# Patient Record
Sex: Male | Born: 1955
Health system: Southern US, Community
[De-identification: ages and names within clinical notes are randomized; demographics above are authoritative.]

## PROBLEM LIST (undated history)

## (undated) DIAGNOSIS — E2740 Unspecified adrenocortical insufficiency: Secondary | ICD-10-CM

## (undated) DIAGNOSIS — E119 Type 2 diabetes mellitus without complications: Secondary | ICD-10-CM

## (undated) DIAGNOSIS — Z86010 Personal history of colonic polyps: Secondary | ICD-10-CM

## (undated) DIAGNOSIS — E1143 Type 2 diabetes mellitus with diabetic autonomic (poly)neuropathy: Secondary | ICD-10-CM

## (undated) DIAGNOSIS — F809 Developmental disorder of speech and language, unspecified: Secondary | ICD-10-CM

## (undated) DIAGNOSIS — G20 Parkinson's disease: Secondary | ICD-10-CM

## (undated) DIAGNOSIS — G8250 Quadriplegia, unspecified: Secondary | ICD-10-CM

## (undated) DIAGNOSIS — K219 Gastro-esophageal reflux disease without esophagitis: Secondary | ICD-10-CM

## (undated) DIAGNOSIS — D649 Anemia, unspecified: Secondary | ICD-10-CM

## (undated) DIAGNOSIS — N390 Urinary tract infection, site not specified: Secondary | ICD-10-CM

## (undated) DIAGNOSIS — N12 Tubulo-interstitial nephritis, not specified as acute or chronic: Secondary | ICD-10-CM

## (undated) DIAGNOSIS — Z931 Gastrostomy status: Secondary | ICD-10-CM

## (undated) DIAGNOSIS — R1310 Dysphagia, unspecified: Secondary | ICD-10-CM

## (undated) DIAGNOSIS — F32A Depression, unspecified: Secondary | ICD-10-CM

## (undated) DIAGNOSIS — G9340 Encephalopathy, unspecified: Secondary | ICD-10-CM

## (undated) DIAGNOSIS — R569 Unspecified convulsions: Secondary | ICD-10-CM

## (undated) DIAGNOSIS — R6510 Systemic inflammatory response syndrome (SIRS) of non-infectious origin without acute organ dysfunction: Secondary | ICD-10-CM

## (undated) DIAGNOSIS — E559 Vitamin D deficiency, unspecified: Secondary | ICD-10-CM

## (undated) DIAGNOSIS — R911 Solitary pulmonary nodule: Secondary | ICD-10-CM

## (undated) DIAGNOSIS — F329 Major depressive disorder, single episode, unspecified: Secondary | ICD-10-CM

## (undated) DIAGNOSIS — K567 Ileus, unspecified: Secondary | ICD-10-CM

## (undated) DIAGNOSIS — M24542 Contracture, left hand: Secondary | ICD-10-CM

## (undated) DIAGNOSIS — G20A1 Parkinson's disease without dyskinesia, without mention of fluctuations: Secondary | ICD-10-CM

## (undated) DIAGNOSIS — S065X9A Traumatic subdural hemorrhage with loss of consciousness of unspecified duration, initial encounter: Secondary | ICD-10-CM

## (undated) DIAGNOSIS — J309 Allergic rhinitis, unspecified: Secondary | ICD-10-CM

## (undated) DIAGNOSIS — R4701 Aphasia: Secondary | ICD-10-CM

## (undated) DIAGNOSIS — D509 Iron deficiency anemia, unspecified: Secondary | ICD-10-CM

## (undated) DIAGNOSIS — A0472 Enterocolitis due to Clostridium difficile, not specified as recurrent: Secondary | ICD-10-CM

## (undated) DIAGNOSIS — Z993 Dependence on wheelchair: Secondary | ICD-10-CM

## (undated) DIAGNOSIS — H269 Unspecified cataract: Secondary | ICD-10-CM

## (undated) DIAGNOSIS — I6203 Nontraumatic chronic subdural hemorrhage: Secondary | ICD-10-CM

## (undated) DIAGNOSIS — E2749 Other adrenocortical insufficiency: Secondary | ICD-10-CM

## (undated) DIAGNOSIS — K3184 Gastroparesis: Secondary | ICD-10-CM

## (undated) DIAGNOSIS — J189 Pneumonia, unspecified organism: Secondary | ICD-10-CM

## (undated) DIAGNOSIS — K5900 Constipation, unspecified: Secondary | ICD-10-CM

## (undated) HISTORY — DX: Unspecified cataract: H26.9

## (undated) HISTORY — DX: Type 2 diabetes mellitus without complications: E11.9

## (undated) HISTORY — PX: PERIPHERALLY INSERTED CENTRAL CATHETER INSERTION: SHX2221

## (undated) HISTORY — DX: Enterocolitis due to Clostridium difficile, not specified as recurrent: A04.72

## (undated) HISTORY — DX: Dependence on wheelchair: Z99.3

## (undated) HISTORY — DX: Type 2 diabetes mellitus with diabetic autonomic (poly)neuropathy: E11.43

## (undated) HISTORY — DX: Gastro-esophageal reflux disease without esophagitis: K21.9

## (undated) HISTORY — DX: Nontraumatic chronic subdural hemorrhage: I62.03

## (undated) HISTORY — DX: Iron deficiency anemia, unspecified: D50.9

## (undated) HISTORY — DX: Systemic inflammatory response syndrome (sirs) of non-infectious origin without acute organ dysfunction: R65.10

## (undated) HISTORY — DX: Gastrostomy status: Z93.1

## (undated) HISTORY — DX: Contracture, left hand: M24.542

## (undated) HISTORY — DX: Tubulo-interstitial nephritis, not specified as acute or chronic: N12

## (undated) HISTORY — PX: ESOPHAGOGASTRODUODENOSCOPY: SHX1529

## (undated) HISTORY — DX: Pneumonia, unspecified organism: J18.9

## (undated) HISTORY — DX: Personal history of colonic polyps: Z86.010

## (undated) HISTORY — PX: COLONOSCOPY: SHX174

## (undated) HISTORY — DX: Solitary pulmonary nodule: R91.1

## (undated) HISTORY — DX: Constipation, unspecified: K59.00

## (undated) HISTORY — PX: PEG TUBE PLACEMENT: SUR1034

## (undated) HISTORY — DX: Ileus, unspecified: K56.7

## (undated) HISTORY — DX: Gastroparesis: K31.84

## (undated) HISTORY — DX: Vitamin D deficiency, unspecified: E55.9

## (undated) HISTORY — DX: Traumatic subdural hemorrhage with loss of consciousness of unspecified duration, initial encounter: S06.5X9A

## (undated) HISTORY — DX: Allergic rhinitis, unspecified: J30.9

## (undated) HISTORY — DX: Parkinson's disease: G20

## (undated) HISTORY — DX: Urinary tract infection, site not specified: N39.0

## (undated) HISTORY — DX: Encephalopathy, unspecified: G93.40

## (undated) HISTORY — DX: Developmental disorder of speech and language, unspecified: F80.9

## (undated) HISTORY — DX: Parkinson's disease without dyskinesia, without mention of fluctuations: G20.A1

## (undated) HISTORY — DX: Unspecified adrenocortical insufficiency: E27.40

## (undated) HISTORY — DX: Other adrenocortical insufficiency: E27.49

---
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
## 2004-09-21 ENCOUNTER — Encounter: Admission: RE | Admit: 2004-09-21 | Discharge: 2004-09-21 | Payer: Self-pay | Admitting: Gastroenterology

## 2004-10-31 ENCOUNTER — Emergency Department (HOSPITAL_COMMUNITY): Admission: EM | Admit: 2004-10-31 | Discharge: 2004-11-01 | Payer: Self-pay | Admitting: Emergency Medicine

## 2004-12-01 ENCOUNTER — Ambulatory Visit (HOSPITAL_COMMUNITY): Admission: RE | Admit: 2004-12-01 | Discharge: 2004-12-01 | Payer: Self-pay | Admitting: Gastroenterology

## 2004-12-21 ENCOUNTER — Inpatient Hospital Stay (HOSPITAL_COMMUNITY): Admission: EM | Admit: 2004-12-21 | Discharge: 2004-12-25 | Payer: Self-pay | Admitting: Emergency Medicine

## 2005-05-29 ENCOUNTER — Inpatient Hospital Stay (HOSPITAL_COMMUNITY): Admission: EM | Admit: 2005-05-29 | Discharge: 2005-06-08 | Payer: Self-pay | Admitting: Emergency Medicine

## 2005-11-10 ENCOUNTER — Emergency Department (HOSPITAL_COMMUNITY): Admission: EM | Admit: 2005-11-10 | Discharge: 2005-11-11 | Payer: Self-pay | Admitting: Emergency Medicine

## 2006-02-13 ENCOUNTER — Inpatient Hospital Stay (HOSPITAL_COMMUNITY): Admission: EM | Admit: 2006-02-13 | Discharge: 2006-02-14 | Payer: Self-pay | Admitting: Emergency Medicine

## 2006-07-05 ENCOUNTER — Ambulatory Visit (HOSPITAL_COMMUNITY): Admission: RE | Admit: 2006-07-05 | Discharge: 2006-07-05 | Payer: Self-pay | Admitting: Internal Medicine

## 2006-08-23 ENCOUNTER — Emergency Department (HOSPITAL_COMMUNITY): Admission: EM | Admit: 2006-08-23 | Discharge: 2006-08-23 | Payer: Self-pay | Admitting: Emergency Medicine

## 2006-12-14 ENCOUNTER — Ambulatory Visit (HOSPITAL_COMMUNITY): Admission: RE | Admit: 2006-12-14 | Discharge: 2006-12-14 | Payer: Self-pay | Admitting: Internal Medicine

## 2007-03-14 ENCOUNTER — Emergency Department (HOSPITAL_COMMUNITY): Admission: EM | Admit: 2007-03-14 | Discharge: 2007-03-14 | Payer: Self-pay | Admitting: Emergency Medicine

## 2008-01-10 ENCOUNTER — Ambulatory Visit (HOSPITAL_COMMUNITY): Admission: RE | Admit: 2008-01-10 | Discharge: 2008-01-10 | Payer: Self-pay | Admitting: Internal Medicine

## 2008-06-12 ENCOUNTER — Inpatient Hospital Stay (HOSPITAL_COMMUNITY): Admission: EM | Admit: 2008-06-12 | Discharge: 2008-06-16 | Payer: Self-pay | Admitting: Emergency Medicine

## 2008-07-04 ENCOUNTER — Emergency Department (HOSPITAL_COMMUNITY): Admission: EM | Admit: 2008-07-04 | Discharge: 2008-07-05 | Payer: Self-pay | Admitting: Emergency Medicine

## 2008-08-18 ENCOUNTER — Encounter: Payer: Self-pay | Admitting: Emergency Medicine

## 2008-08-18 ENCOUNTER — Ambulatory Visit: Payer: Self-pay | Admitting: Diagnostic Radiology

## 2008-08-19 ENCOUNTER — Inpatient Hospital Stay (HOSPITAL_COMMUNITY): Admission: EM | Admit: 2008-08-19 | Discharge: 2008-08-21 | Payer: Self-pay | Admitting: Internal Medicine

## 2008-12-16 ENCOUNTER — Emergency Department (HOSPITAL_BASED_OUTPATIENT_CLINIC_OR_DEPARTMENT_OTHER): Admission: EM | Admit: 2008-12-16 | Discharge: 2008-12-16 | Payer: Self-pay | Admitting: Emergency Medicine

## 2008-12-16 ENCOUNTER — Ambulatory Visit: Payer: Self-pay | Admitting: Radiology

## 2009-07-06 ENCOUNTER — Emergency Department (HOSPITAL_COMMUNITY): Admission: EM | Admit: 2009-07-06 | Discharge: 2009-07-07 | Payer: Self-pay | Admitting: Emergency Medicine

## 2010-04-18 ENCOUNTER — Encounter: Payer: Self-pay | Admitting: Gastroenterology

## 2010-06-16 LAB — CBC
MCHC: 31.8 g/dL (ref 30.0–36.0)
MCV: 78.8 fL (ref 78.0–100.0)
Platelets: 222 10*3/uL (ref 150–400)
RDW: 13.8 % (ref 11.5–15.5)

## 2010-06-16 LAB — BASIC METABOLIC PANEL
BUN: 20 mg/dL (ref 6–23)
CO2: 29 mEq/L (ref 19–32)
Chloride: 101 mEq/L (ref 96–112)
Creatinine, Ser: 0.55 mg/dL (ref 0.4–1.5)
Glucose, Bld: 89 mg/dL (ref 70–99)

## 2010-06-16 LAB — URINALYSIS, ROUTINE W REFLEX MICROSCOPIC
Hgb urine dipstick: NEGATIVE
Protein, ur: 30 mg/dL — AB
Urobilinogen, UA: 1 mg/dL (ref 0.0–1.0)

## 2010-06-16 LAB — URINE MICROSCOPIC-ADD ON

## 2010-06-16 LAB — DIFFERENTIAL
Basophils Relative: 0 % (ref 0–1)
Eosinophils Absolute: 0.1 10*3/uL (ref 0.0–0.7)
Monocytes Relative: 9 % (ref 3–12)
Neutrophils Relative %: 57 % (ref 43–77)

## 2010-07-02 LAB — CULTURE, BLOOD (ROUTINE X 2)
Culture: NO GROWTH
Culture: NO GROWTH

## 2010-07-02 LAB — URINE CULTURE

## 2010-07-02 LAB — CBC
HCT: 37.2 % — ABNORMAL LOW (ref 39.0–52.0)
Hemoglobin: 12.1 g/dL — ABNORMAL LOW (ref 13.0–17.0)
MCHC: 32.6 g/dL (ref 30.0–36.0)
MCV: 76.5 fL — ABNORMAL LOW (ref 78.0–100.0)
RBC: 4.86 MIL/uL (ref 4.22–5.81)

## 2010-07-02 LAB — DIFFERENTIAL
Basophils Relative: 1 % (ref 0–1)
Eosinophils Absolute: 0.1 10*3/uL (ref 0.0–0.7)
Eosinophils Relative: 1 % (ref 0–5)
Monocytes Absolute: 1.5 10*3/uL — ABNORMAL HIGH (ref 0.1–1.0)
Monocytes Relative: 13 % — ABNORMAL HIGH (ref 3–12)
Neutrophils Relative %: 72 % (ref 43–77)

## 2010-07-02 LAB — URINALYSIS, ROUTINE W REFLEX MICROSCOPIC
Bilirubin Urine: NEGATIVE
Protein, ur: 30 mg/dL — AB
Urobilinogen, UA: 1 mg/dL (ref 0.0–1.0)

## 2010-07-02 LAB — BASIC METABOLIC PANEL
CO2: 29 mEq/L (ref 19–32)
Chloride: 100 mEq/L (ref 96–112)
Creatinine, Ser: 0.7 mg/dL (ref 0.4–1.5)
GFR calc Af Amer: >60 mL/min (ref >60)

## 2010-07-02 LAB — URINE MICROSCOPIC-ADD ON

## 2010-07-06 LAB — CBC
HCT: 34 % — ABNORMAL LOW (ref 39.0–52.0)
Hemoglobin: 11.1 g/dL — ABNORMAL LOW (ref 13.0–17.0)
Hemoglobin: 9.8 g/dL — ABNORMAL LOW (ref 13.0–17.0)
Hemoglobin: 12.7 g/dL — ABNORMAL LOW (ref 13.0–17.0)
MCHC: 32.6 g/dL (ref 30.0–36.0)
MCHC: 32.6 g/dL (ref 30.0–36.0)
MCHC: 32.7 g/dL (ref 30.0–36.0)
MCV: 77.2 fL — ABNORMAL LOW (ref 78.0–100.0)
Platelets: 187 10*3/uL (ref 150–400)
Platelets: 203 10*3/uL (ref 150–400)
RBC: 3.96 MIL/uL — ABNORMAL LOW (ref 4.22–5.81)
RBC: 4.46 MIL/uL (ref 4.22–5.81)
RBC: 5.15 MIL/uL (ref 4.22–5.81)
RDW: 14.2 % (ref 11.5–15.5)
RDW: 14.2 % (ref 11.5–15.5)
WBC: 7.4 10*3/uL (ref 4.0–10.5)

## 2010-07-06 LAB — BASIC METABOLIC PANEL
BUN: 6 mg/dL (ref 6–23)
CO2: 25 mEq/L (ref 19–32)
Calcium: 7.9 mg/dL — ABNORMAL LOW (ref 8.4–10.5)
Calcium: 8.1 mg/dL — ABNORMAL LOW (ref 8.4–10.5)
Chloride: 103 mEq/L (ref 96–112)
Creatinine, Ser: 0.55 mg/dL (ref 0.4–1.5)
GFR calc non Af Amer: >60 mL/min (ref >60)
Glucose, Bld: 113 mg/dL — ABNORMAL HIGH (ref 70–99)
Glucose, Bld: 115 mg/dL — ABNORMAL HIGH (ref 70–99)
Sodium: 136 mEq/L (ref 135–145)

## 2010-07-06 LAB — URINALYSIS, MICROSCOPIC ONLY
Glucose, UA: NEGATIVE mg/dL
pH: 5.5 (ref 5.0–8.0)

## 2010-07-06 LAB — URINALYSIS, ROUTINE W REFLEX MICROSCOPIC
Protein, ur: NEGATIVE mg/dL
Urobilinogen, UA: 1 mg/dL (ref 0.0–1.0)

## 2010-07-06 LAB — COMPREHENSIVE METABOLIC PANEL
ALT: 29 U/L (ref 0–53)
ALT: 22 U/L (ref 0–53)
Alkaline Phosphatase: 105 U/L (ref 39–117)
Alkaline Phosphatase: 167 U/L — ABNORMAL HIGH (ref 39–117)
CO2: 26 mEq/L (ref 19–32)
CO2: 27 mEq/L (ref 19–32)
Calcium: 7.8 mg/dL — ABNORMAL LOW (ref 8.4–10.5)
GFR calc non Af Amer: >60 mL/min (ref >60)
GFR calc non Af Amer: >60 mL/min (ref >60)
Glucose, Bld: 95 mg/dL (ref 70–99)
Glucose, Bld: 86 mg/dL (ref 70–99)
Potassium: 3.9 mEq/L (ref 3.5–5.1)
Potassium: 4.2 mEq/L (ref 3.5–5.1)
Sodium: 136 mEq/L (ref 135–145)
Sodium: 140 mEq/L (ref 135–145)

## 2010-07-06 LAB — DIFFERENTIAL
Basophils Absolute: 0 10*3/uL (ref 0.0–0.1)
Basophils Absolute: 0 10*3/uL (ref 0.0–0.1)
Basophils Relative: 0 % (ref 0–1)
Basophils Relative: 0 % (ref 0–1)
Basophils Relative: 1 % (ref 0–1)
Eosinophils Absolute: 0.1 10*3/uL (ref 0.0–0.7)
Eosinophils Absolute: 0.1 10*3/uL (ref 0.0–0.7)
Eosinophils Relative: 0 % (ref 0–5)
Lymphocytes Relative: 10 % — ABNORMAL LOW (ref 12–46)
Monocytes Absolute: 0.7 10*3/uL (ref 0.1–1.0)
Monocytes Relative: 6 % (ref 3–12)
Neutrophils Relative %: 81 % — ABNORMAL HIGH (ref 43–77)
Neutrophils Relative %: 85 % — ABNORMAL HIGH (ref 43–77)

## 2010-07-06 LAB — PROTIME-INR: INR: 1.2 (ref 0.00–1.49)

## 2010-07-06 LAB — URINE CULTURE: Culture: NO GROWTH

## 2010-07-06 LAB — CULTURE, BLOOD (ROUTINE X 2)
Culture: NO GROWTH
Culture: NO GROWTH

## 2010-07-06 LAB — URINE MICROSCOPIC-ADD ON

## 2010-07-07 LAB — COMPREHENSIVE METABOLIC PANEL
ALT: 22 U/L (ref 0–53)
AST: 16 U/L (ref 0–37)
Alkaline Phosphatase: 109 U/L (ref 39–117)
CO2: 22 mEq/L (ref 19–32)
Chloride: 93 mEq/L — ABNORMAL LOW (ref 96–112)
Creatinine, Ser: 0.59 mg/dL (ref 0.4–1.5)
GFR calc Af Amer: >60 mL/min (ref >60)
GFR calc non Af Amer: >60 mL/min (ref >60)
Potassium: 3.6 mEq/L (ref 3.5–5.1)
Sodium: 124 mEq/L — ABNORMAL LOW (ref 135–145)
Total Bilirubin: 0.6 mg/dL (ref 0.3–1.2)

## 2010-07-07 LAB — DIFFERENTIAL
Basophils Absolute: 0 10*3/uL (ref 0.0–0.1)
Basophils Relative: 0 % (ref 0–1)
Eosinophils Absolute: 0 10*3/uL (ref 0.0–0.7)
Eosinophils Relative: 0 % (ref 0–5)

## 2010-07-07 LAB — URINALYSIS, ROUTINE W REFLEX MICROSCOPIC
Bilirubin Urine: NEGATIVE
Ketones, ur: NEGATIVE mg/dL
Nitrite: POSITIVE — AB
Urobilinogen, UA: 1 mg/dL (ref 0.0–1.0)

## 2010-07-07 LAB — LIPASE, BLOOD: Lipase: 21 U/L (ref 11–59)

## 2010-07-07 LAB — URINE CULTURE: Colony Count: >=100000

## 2010-07-07 LAB — CBC
MCV: 76.5 fL — ABNORMAL LOW (ref 78.0–100.0)
RBC: 4.49 MIL/uL (ref 4.22–5.81)
WBC: 17.2 10*3/uL — ABNORMAL HIGH (ref 4.0–10.5)

## 2010-07-08 LAB — DIFFERENTIAL
Lymphocytes Relative: 14 % (ref 12–46)
Lymphs Abs: 1.8 10*3/uL (ref 0.7–4.0)
Monocytes Absolute: 1.7 10*3/uL — ABNORMAL HIGH (ref 0.1–1.0)
Monocytes Relative: 14 % — ABNORMAL HIGH (ref 3–12)
Neutro Abs: 9 10*3/uL — ABNORMAL HIGH (ref 1.7–7.7)

## 2010-07-08 LAB — BASIC METABOLIC PANEL
BUN: 8 mg/dL (ref 6–23)
CO2: 24 mEq/L (ref 19–32)
CO2: 24 mEq/L (ref 19–32)
Calcium: 7.8 mg/dL — ABNORMAL LOW (ref 8.4–10.5)
Calcium: 7.9 mg/dL — ABNORMAL LOW (ref 8.4–10.5)
Calcium: 8.2 mg/dL — ABNORMAL LOW (ref 8.4–10.5)
Chloride: 103 mEq/L (ref 96–112)
Chloride: 108 mEq/L (ref 96–112)
Creatinine, Ser: 0.62 mg/dL (ref 0.4–1.5)
GFR calc Af Amer: >60 mL/min (ref >60)
GFR calc Af Amer: >60 mL/min (ref >60)
GFR calc non Af Amer: >60 mL/min (ref >60)
Glucose, Bld: 114 mg/dL — ABNORMAL HIGH (ref 70–99)
Potassium: 3.9 mEq/L (ref 3.5–5.1)
Potassium: 4.1 mEq/L (ref 3.5–5.1)
Sodium: 136 mEq/L (ref 135–145)
Sodium: 137 mEq/L (ref 135–145)

## 2010-07-08 LAB — URINALYSIS, ROUTINE W REFLEX MICROSCOPIC
Bilirubin Urine: NEGATIVE
Ketones, ur: NEGATIVE mg/dL
Nitrite: POSITIVE — AB
Urobilinogen, UA: 1 mg/dL (ref 0.0–1.0)

## 2010-07-08 LAB — CBC
HCT: 28.7 % — ABNORMAL LOW (ref 39.0–52.0)
HCT: 33.2 % — ABNORMAL LOW (ref 39.0–52.0)
HCT: 35.2 % — ABNORMAL LOW (ref 39.0–52.0)
HCT: 35.7 % — ABNORMAL LOW (ref 39.0–52.0)
Hemoglobin: 10.5 g/dL — ABNORMAL LOW (ref 13.0–17.0)
Hemoglobin: 9.4 g/dL — ABNORMAL LOW (ref 13.0–17.0)
Hemoglobin: 9.9 g/dL — ABNORMAL LOW (ref 13.0–17.0)
MCHC: 31.7 g/dL (ref 30.0–36.0)
MCHC: 32.3 g/dL (ref 30.0–36.0)
MCHC: 32.9 g/dL (ref 30.0–36.0)
MCV: 77.9 fL — ABNORMAL LOW (ref 78.0–100.0)
Platelets: 245 10*3/uL (ref 150–400)
RBC: 3.71 MIL/uL — ABNORMAL LOW (ref 4.22–5.81)
RBC: 3.95 MIL/uL — ABNORMAL LOW (ref 4.22–5.81)
RBC: 4.51 MIL/uL (ref 4.22–5.81)
RDW: 13.2 % (ref 11.5–15.5)
RDW: 13.8 % (ref 11.5–15.5)
WBC: 10.9 10*3/uL — ABNORMAL HIGH (ref 4.0–10.5)
WBC: 5.7 10*3/uL (ref 4.0–10.5)

## 2010-07-08 LAB — COMPREHENSIVE METABOLIC PANEL
Albumin: 3.2 g/dL — ABNORMAL LOW (ref 3.5–5.2)
Alkaline Phosphatase: 117 U/L (ref 39–117)
BUN: 12 mg/dL (ref 6–23)
Calcium: 8.6 mg/dL (ref 8.4–10.5)
Potassium: 3.6 mEq/L (ref 3.5–5.1)
Total Protein: 7.1 g/dL (ref 6.0–8.3)

## 2010-07-08 LAB — URINE CULTURE: Colony Count: >=100000

## 2010-07-08 LAB — WOUND CULTURE

## 2010-07-08 LAB — URINE MICROSCOPIC-ADD ON

## 2010-07-20 ENCOUNTER — Emergency Department (HOSPITAL_BASED_OUTPATIENT_CLINIC_OR_DEPARTMENT_OTHER)
Admission: EM | Admit: 2010-07-20 | Discharge: 2010-07-20 | Disposition: A | Discharge status: Home / Self Care | Payer: Medicare Other | Attending: Emergency Medicine | Admitting: Emergency Medicine

## 2010-07-20 ENCOUNTER — Emergency Department (INDEPENDENT_AMBULATORY_CARE_PROVIDER_SITE_OTHER): Payer: Medicare Other

## 2010-07-20 DIAGNOSIS — G20 Parkinson's disease: Secondary | ICD-10-CM | POA: Insufficient documentation

## 2010-07-20 DIAGNOSIS — R4701 Aphasia: Secondary | ICD-10-CM | POA: Insufficient documentation

## 2010-07-20 DIAGNOSIS — G20A1 Parkinson's disease without dyskinesia, without mention of fluctuations: Secondary | ICD-10-CM | POA: Insufficient documentation

## 2010-07-20 DIAGNOSIS — Z79899 Other long term (current) drug therapy: Secondary | ICD-10-CM | POA: Insufficient documentation

## 2010-07-20 DIAGNOSIS — Z431 Encounter for attention to gastrostomy: Secondary | ICD-10-CM

## 2010-07-20 DIAGNOSIS — Y849 Medical procedure, unspecified as the cause of abnormal reaction of the patient, or of later complication, without mention of misadventure at the time of the procedure: Secondary | ICD-10-CM | POA: Insufficient documentation

## 2010-07-20 DIAGNOSIS — K9423 Gastrostomy malfunction: Secondary | ICD-10-CM | POA: Insufficient documentation

## 2010-07-20 DIAGNOSIS — G8250 Quadriplegia, unspecified: Secondary | ICD-10-CM | POA: Insufficient documentation

## 2010-08-10 NOTE — H&P (Signed)
NAMEMILOS, BEVANS             ACCOUNT NO.:  [id number]   MEDICAL RECORD NO.:  [id number]          PATIENT TYPE:  INP   LOCATION:  1534                         FACILITY:  [facility]   PHYSICIAN:  Manus Gunning, MD      DATE OF BIRTH:  Feb 19, 1956   DATE OF ADMISSION:  08/19/2008  DATE OF DISCHARGE:                              HISTORY & PHYSICAL   ADMITTING SERVICE:  Hospitalist Service.   CHIEF COMPLAINT:  Check from [facility] for evaluation of  fever and suspected urinary tract infection.   HISTORY OF PRESENT ILLNESS:  Mr. Korb is a 55 year old African  American patient with a past medical history significant for  quadriplegia and aphasia secondary to neurological trauma sustained  approximately four years ago.  He is a skilled nursing facility resident  and was sent from their facility to the [facility] for evaluation for fever documented at 103.5.  At the facility,  he was noticed to have a urinary tract infection, and based on the fact  that he was tachycardic with a mild leukocytosis of 10,900 with a fever  of 103.5, they requested admission to our facility.  The patient  presented with his mother who claims that he was doing well today, but  she was called secondary to the patient having fever and nausea and  vomiting.  She claims he vomited brownish vomitus and that he complained  of right lower quadrant abdominal pain.  An abdominal KUB did not  demonstrate any signs of obstruction to demonstrate air fluid levels  consistent with liquid stool.  Also, chest x-ray did not demonstrate any  overt signs of pneumonia, though, there may be some right basal  pneumonia versus atelectasis.  I have reviewed this personally again.  I  do not find any overt signs of pneumonia at this time.  The mother  claims that he has not been exposed to any sick contacts, but that he  does reside at a skilled nursing facility.  There is no viral  syndrome  or infective process ongoing through the facility at this time.  He has  not had a change in his dietary regimen.  He is essentially on Jevity  tube feedings.  Apart from the abdominal pain, there is no history of  chest pain, palpitations, PND or orthopnea.  Again, the patient is  aphasic.  No history of trauma, loss of consciousness.  No history of  recent seizures.  No history of falls.  No shortness of breath, cough or  expectoration.   The patient has a history of constipation, but there is no history of  dysuria, polyuria or hematuria.  The patient is, according to mother,  likely capable of walking with assistance, but has bilateral upper  extremity contractures and inability to use his upper extremities.   PAST MEDICAL/SURGICAL HISTORY:  1. Quadriplegia as a result of neurological trauma four years ago.  2. Hyperkalemia.  3. Adrenal insufficiency.  4. Parkinson's disease.  5. Seizure disorder.  6. Anemia.  7. Constipation.   PAST SURGICAL HISTORY:  PEG tube  placement.   ALLERGIES:  ASPIRIN, CEPHALOSPORINS, NSAIDS AND PENICILLIN.  OF NOTE,  THE PATIENT HAS RECEIVED CEPHALOSPORINS FOR EXTENDED PERIODS OF TIME  DURING HIS PREVIOUS ADMISSION TO OUR FACILITY IN MARCH WITHOUT ANY  COMPLICATIONS.  I HAVE DISCUSSED THIS WITH THE MOTHER AS THE PLAN IS TO  PROVIDE MORE CEPHALOSPORINS.   SOCIAL HISTORY:  No evidence of alcohol, tobacco or illicits.  Lives in  a skilled nursing facility.   FAMILY HISTORY:  Negative for diabetes and hypertension.   HOME MEDICATIONS:  1. Artificial Tears one drop both eyes every 8 hours.  2. Baclofen 10 mg three times daily.  3. Carbidopa/Levodopa 25/100 every hours.  4. Cetirizine 5 mg daily.  5. Dilantin 5 mg two and a half tablets b.i.d.  6. Ferrous sulfate elixir 220 mg/5 ml 7.2 ml daily.  7. Folic acid 1 mg daily.  8. Mirtazapine 50 mg half tablet daily.  9. Polyethylene glycol powder 17 g in 6-8 ounces of water b.i.d.   10.Potassium chloride 20/15 ml 10% liquid, 30 ml daily.  11.Prednisone 5 mg one tablet in a.m. and half tablet in p.m.  12.Prevacid 30 mg one daily.  13.Tizanidine 4 mg t.i.d.  14.Bisac evac suppository one daily p.r.n.  15.Fleet's enema rectal administration daily p.r.n.  16.Phenergan 25 mg suppository q.6 h p.r.n.  17.Free water at 30 ml before and after medications via PEG.  18.Jevity 1.2 90 ml per hour via PEG continuous 7:00 p.m. to 7:00      a.m., 12 hour duration.  19.Free water H2O flushes 120 cc PEG q.4 h.  20.Bolus feeding Jevity 1.2 360 ml PEG t.i.d.   REVIEW OF SYSTEMS:  A 14-point review of systems performed, pertinent  positives negative unless described above.   PHYSICAL EXAMINATION:  VITAL SIGNS:  At time of interview, temperature  100.2, heart rate 72, respirations 20, blood pressure 106/63, O2  saturation 98% on 2 liters.  GENERAL:  Well-developed, well-nourished African American male sitting  up in bed, no acute distress.  HEENT:  Normocephalic, atraumatic.  Moist oral mucosa.  No thrush,  erythema or postnasal drip.  Eyes anicteric.  Extraocular muscles  intact.  Pupils are equal and reactive to light and accommodation.  CARDIOVASCULAR:  S1, S2 normal, regular rate and rhythm.  No murmurs,  rubs or gallops.  RS:  Air entry bilaterally equal.  No rales, rhonchi or wheezes  appreciated.  The patient has coarse breath sounds bilaterally.  ABDOMEN:  PEG site appears to be no signs of infection.  There is no  tenderness on palpation throughout the abdomen.  Positive bowel sounds.  No organomegaly.  No Cullen sign, no Turner sign, no flank  discoloration.  EXTREMITIES:  No cyanosis, clubbing or edema.  Positive bilateral  dorsalis pedis.  Bilateral upper extremity contractures.  CNS:  Alert.  Cannot assess orientation.  Mother claims that he  understands all conversation but is unable to respond, though I cannot  corroborate this during my interview.  Unable to move  bilateral upper  extremities.  Lower extremities does demonstrate minimal movement.  Sensation appears to be intact bilaterally, symmetrical.  SKIN:  No breakdowns, ulcerations or masses.  Bilateral decubiti grade I  and grade II.  HEME/ONC:  No palpable lymphadenopathy.  No bruising or petechia.   LABORATORY DATA:  Dilantin level 9.1, urine microscopy demonstrates  bacteria many, RBC 3-6, WBC 3-6.  Urinalysis demonstrates small  leukocytes, negative nitrites, positive blood, negative ketones and  bilirubin and glucose.  Sodium 140, potassium 4.2, chloride 98, CO2 27,  glucose 86, BUN 17, creatinine 0.6, alk-phos 167, AST 40, protein 9.2,  albumin 4.2, calcium 9.1.  WBC 10,900, hemoglobin 12.7, hematocrit 38.9,  platelet count 231, polymorphs 85.   ASSESSMENT/PLAN:  1. Urinary tract infection.  At this time, start Rocephin 1 g IV q.24      h.  Starting his last time, his urine culture and sensitivities      most recent one demonstrated E. coli, but in the past has been      Providencia which has only been sensitive to ceftriaxone in the      past.  Also, note that cephalosporins have been listed as an      allergy.  During his previous admission, he had received an      extended course of cephalosporin including ceftriaxone followed by      Keflex without any complications.  I have discussed this with the      mother.  She has expressed understanding and agrees with the plan      at this time.  2. Leukocytosis and fever.  Essentially, I do not believe that there      is any other source except urine at this time.  I reviewed the      chest x-ray.  I will obtain a repeat chest x-ray in the morning.      If there is a concern for a pneumonia, then we will need to broaden      spectrum, probably vancomycin and Levaquin can be added.  3. Aphasia with quadriplegia.  I do not believe the patient is truly      quadriplegic as per the mother.  She claims that he is capable of      walking  with the help of assistance.  At this time, we will provide      supportive care and continue all home medications.  4. Seizure disorder.  Again, continue all home medications and      monitor.  5. Gastrointestinal and deep vein thrombosis prophylaxis.  The patient      is to continue his Prevacid and also receive heparin 5000 units      subcu q.8 h.      Manus Gunning, MD  Electronically Signed     SP/MEDQ  D:  08/19/2008  T:  08/19/2008  Job:  [id number]

## 2010-08-10 NOTE — Discharge Summary (Signed)
[name], [name]             ACCOUNT NO.:  [id number]   MEDICAL RECORD NO.:  [id number]          PATIENT TYPE:  INP   LOCATION:  1224                         FACILITY:  [facility]   PHYSICIAN:  Theodosia Paling, MD    DATE OF BIRTH:  1955-04-17   DATE OF ADMISSION:  06/12/2008  DATE OF DISCHARGE:  06/15/2008                               DISCHARGE SUMMARY   DATE OF DISCHARGE:  June 15, 2008.   PRIMARY CARE PHYSICIAN:  Maxwell Caul, M.D. from [facility].   DISCHARGE DIAGNOSES:  1. Peripherally inserted central catheter site infection.  2. Urinary tract infection from pan resistant Providencia, sensitive      only to is ceftriaxone.  3. PEG tube replacement.  4. History of quadriplegia as a result of neurological trauma 4 years      ago.  5. Hypokalemia.  6. Adrenal insufficiency.  7. Parkinson's disease.  8. Seizure disorder.  9. Anemia.  10.Constipation.   DISCHARGE MEDICATIONS:  New medication added cefixime 400 mg liquid via  PEG tube daily for 9 days.   The patient is to continue home medication which includes following  1. Mirtazapine 7.5 mg daily.  2. MiraLax 17 grams in water daily.  3. Kay Ciel 40 mEq daily  4. Prednisone every morning, 5 mg and 2.5 mg every evening.  5. Prevacid 30 mg daily.  6,  Tizanidine 4 mg q.8 h.  1. Baclofen 10 mg q.8 h.  2. Carbidopa/levodopa 25/100 mg q.8 h.  3. Cetirizine 500 mg p.o. daily.  4. Dilantin 125 mg q.12 h.  5. Ferrous sulfate 5 mL q. morning.  6. Florinef 0.1 mg at bedtime.  7. Folic acid 1 mg daily.  8. Ketoconazole shampoo daily.  9. Artificial Tears each eye three times a day.  10.Fleet's enema p.r.n.  11.Dulcolax suppository p.r.n.  12.Jevity Plus 1.2 calories 365 mL q.4 h.   HOSPITAL COURSE:  Following issues were addressed during the  hospitalization.  1. Urinary tract infection with systemic inflammatory syndrome.  The      patient had evidence of urinary tract infection.  He came with a   low grade fever.  Urine culture and sensitivity was done which      showed the same.   Dictation stopped here.      Theodosia Paling, MD  Electronically Signed     NP/MEDQ  D:  06/16/2008  T:  06/16/2008  Job:  [id number]

## 2010-08-10 NOTE — Discharge Summary (Signed)
NAMEBORIS, ANGLE             ACCOUNT NO.:  [id number]   MEDICAL RECORD NO.:  [id number]          PATIENT TYPE:  INP   LOCATION:  1224                         FACILITY:  [facility]   PHYSICIAN:  Theodosia Paling, MD    DATE OF BIRTH:  [birth year]   DATE OF ADMISSION:  06/12/2008  DATE OF DISCHARGE:                               DISCHARGE SUMMARY   CONTINUATION   HOSPITAL COURSE:  1. Urinary tract infection:  The patient was admitted with fever,      blood pressure in the lower range.  He was started on stress dose      of steroids, which were eventually switched to his home dose of      prednisone.  The urine culture grew Providencia which was resistant      or intermediate sensitive to all antibiotics except for      ceftriaxone.  The patient remained afebrile, had no hemodynamic      disturbances.  At the time of discharge, he is hemodynamically      stable, asymptomatic. He will be completing cefixime 400 mg via G      tube daily for 9 days.  There is an apparent allergic history of      cefazolin; however patient was observed in the ICU and he did not      have any evidence of any allergic reaction to cephalosporins.  2. PEG tube site infection.  The patient received levofloxacin when      the patient was admitted to the hospital.  The patient did have      some discharge  and the wound has been cultured, the results are      not back yet.  The patient is going home on cefixime.  He has      almost completed at least a 3 day course of IV quinolones.  He will      complete cefixime for the next 9 days which should help with his      PEG site infection, which is in the form of minor crusting at this      time with no erythema or evidence of extensive cellulitis.  3. PEG tube replacement.  Given the PEG tube site infection, PEG tube      replacement was done by interventional radiology.  4. Renal insufficiency.  At the time of admission, the patient did      have low grade  hypertension, however, he was started on Solu-      Cortef.  At this time, the patient is on home regimen of prednisone      and he is hemodynamically stable.   STUDIES PERFORMED:  On June 12, 2008, the patient underwent abdominal x-  ray which showed mild atelectasis at the lung based, rectal fecal  impaction, colonic ileus.  On June 13, 2008, the patient underwent repeat swallow evaluation to  see if there is any need for PEG tube placement or if swallowing  function has improved; however, he did fail his swallow evaluation and  therefore we proceeded with PEG tube replacement.   CONSULTANTS:  None.   PROCEDURES:  PEG tube replacement done on June 13, 2008.   DISPOSITION:  The patient is going back to the nursing home facility.   Total time spent in this discharge of the patient is around 45 minutes.      Theodosia Paling, MD  Electronically Signed     NP/MEDQ  D:  06/16/2008  T:  06/16/2008  Job:  [id number]   cc:   Maxwell Caul, M.D.

## 2010-08-10 NOTE — H&P (Signed)
NAMEALEN, SUITE             ACCOUNT NO.:  [id number]   MEDICAL RECORD NO.:  [id number]          PATIENT TYPE:  INP   LOCATION:  0106                         FACILITY:  [facility]   PHYSICIAN:  Pedro Earls, MD     DATE OF BIRTH:  1955/11/20   DATE OF ADMISSION:  06/12/2008  DATE OF DISCHARGE:                              HISTORY & PHYSICAL   CHIEF COMPLAINT:  Fever and abdominal pain.   HISTORY OF PRESENT ILLNESS:  This is a 55 year old African American  patient with a past medical history significant for quadriplegia,  patient is aphasic due to neurological trauma who lives in a nursing  home, presented with a fever and abdominal pain.  According to the  patient's mother, she was called last night for a fever while patient  had a temperature at the nursing home.  This morning when patient's  mother had gone to the nursing home patient was doing fine until she  noticed that the feeding tube came out.  Patient also had some redness  around the PEG tube site, was also complaining of some abdominal pain as  well.   REVIEW OF SYSTEMS:  As above, the rest of the review of systems were  negative.   PAST MEDICAL HISTORY:  1. Quadriplegia as a result of neurological trauma 4 years ago.  2. Hypokalemia.  3. Adrenal insufficiency.  4. Parkinson's disease.  5. Seizure disorder.  6. Anemia.  7. Constipation.   MEDICATION:  1. Mirtazapine 7.5 mg daily.  2. MiraLax 17 grams in water daily.  3. KCl 40 mEq daily.  4. Prednisone 5 mg every morning, 2.5 mg every evening.  5. Prevacid 30 mg daily.  6. Tizanidine 4 mg three times daily.  7. Baclofen 10 mg three times daily.  8. Carbidopa/levodopa 25/100 mg three times daily.  9. Cetirizine 5 mg daily.  10.Dilantin 125 mg two times daily.  11.Ferrous sulfate 5 mL every morning.  12.Fludrocortisone 0.1 mg nightly.  13.Folic acid 1 mg daily.  14.Ketoconazole shampoo daily.  15.Artificial tears each eye three times daily.  16.Fleet  Enema p.r.n.  17.Dulcolax suppository p.r.n.  18.Jevity plus 1.2 calorie 365 mL q.4 h.   PAST SURGICAL HISTORY:  PEG tube placement x3.   ALLERGIES:  1. ASPIRIN.  2. CEPHALOSPORIN.  3. NSAIDS.  4. PENICILLIN.   SOCIAL HISTORY:  No history of alcohol, IV drug abuse or smoking.  The  patient lives in a nursing home.   FAMILY HISTORY:  Negative for diabetes, hypertension.   PHYSICAL EXAMINATION:  VITALS:  Temperature is 101.7, blood pressure  107/70, pulse is 102-106, respirations 16-22, pulse ox 96-97% on room  air.  Patient awake, alert, oriented, is aphasic although answers with  nodding.  Appears to be in some distress secondary to the abdominal  pain.  HEENT:  Pupils equal, round, react to light, no icterus, no pallor.  Extraocular movements are intact.  Oral mucosa is dry.  NECK:  Supple, no JVD, no lymphadenopathy.  CVS:  S1-S2.  Sinus tachycardia.  No murmurs, heaves or gallops.  CHEST:  Clear.  ABDOMEN:  Soft.  Patient has a Foley catheter which was replaced to  maintain patency at the PEG site.  There is a surrounding edema,  erythema, warmth and tenderness and some fresh blood as well.  Bowel  sounds present, no hepatosplenomegaly.  EXTREMITIES:  Pedal pulses are present.  No clubbing, cyanosis or edema.  CNS:  Patient is quadriplegic.  cranial nerves II through XII are  intact.  SKIN:  As above.  MUSCULOSKELETAL:  Unremarkable.   LABORATORY DATA:  The patient's UA is positive for too numerous to count  WBCs, leukocyte esterase positive and nitrate positive.  Abdominal x-ray  showed fecal rectal impaction, mild atelectasis at the base, colonic  ileus.  Potassium is 3.6, sodium is 131, creatinine 0.61, albumin 3.2,  white count is 12.5 with a left shift.   IMPRESSION:  1. Urinary tract infection  with systemic inflammatory response      syndrome.  2. Abdominal wall cellulitis at the percutaneous endoscopic      gastrostomy insertion site.  3. Dehydration.  4.  Quadriplegia.  5. Anemia.  6. Adrenal insufficiency.   PLAN:  1. I will admit to Med-Surg.  2. Start IV Levaquin 500 IV daily.  3. Stress dose steroids x4 and then daily until patient has a PEG tube      and can be given steroids via PEG tube.  4. IV fluids.  5. Will consult interventional radiology to replace PEG tube.  6. Wound care consult.      Pedro Earls, MD  Electronically Signed     NS/MEDQ  D:  06/12/2008  T:  06/12/2008  Job:  [id number]   cc:   Maxwell Caul, M.D.

## 2010-08-10 NOTE — Discharge Summary (Signed)
NAMEEGYPT, [name]             ACCOUNT NO.:  [id number]   MEDICAL RECORD NO.:  [id number]          PATIENT TYPE:  INP   LOCATION:  1502                         [facility]ACILITY[facility]  [facility]   PHYSICIAN:  Theodosia Paling, MD    DATE OF BIRTH:  March 29, 1955   DATE OF ADMISSION:  08/19/2008  DATE OF DISCHARGE:  08/21/2008                               DISCHARGE SUMMARY   PRIMARY CARE PHYSICIAN:  Dr. Baltazar Najjar.   ADMITTING HISTORY:  Please refer to the excellent admission note  dictated by Dr. [place] Callas under history of present illness.   DISCHARGE DIAGNOSES:  1. E coli urinary tract infection.  E. coli sensitive to      cephalosporin, cephazolin as well as ceftriaxone.  2. Questionable right lung pneumonia.   SECONDARY DIAGNOSES:  1. History of quadriplegia secondary to neurological trauma.  2. History of adrenal insufficiency.  3. History of Parkinson disorder.  4. History of seizure disorder.  5. History of anemia.   DISCHARGE MEDICATIONS:  The patient is to continue home medications  which are:  1. Artificial tear drops one drop in each eye three times a day.  2. Baclofen 10 mg via G-tube three times a day.  3. Carbidopa/levodopa 25/100 mg via G-tube every 8 hours.  4. Cetirizine tablet 5 mg via G-tube daily.  5. Dilantin 125 mg via G-tube twice a day.  6. Ferrous sulfate elixir 220 mg 5 ml via G-tube every morning.  7. Fludrocortisone 0.1 mg via G-tube at bedtime daily.  8. Folic acid 1 mg via G-tube daily.  9. Polyethylene Glycon powder 3350, dissolve one capsule in 6-8 ounces      of water, mix well, give twice a day via G-tube.  10.KCl 20 mEq 15 ml 10% liquid, give 30 ml daily.  11.Prednisone tablet 5 mg every morning.  12.Prednisone 5 mg half a tab 2.5 mg every evening.  13.Prevacid 30 mg via G-tube every day.  14.Tizanidine 4 mg via G-tube three times a day.  15.Dulcolax 10 mg PR q.h.s. p.r.n.  16.Fleet's enema rectally p.r.n.  17.Phenergan 25 mg q.6 hours p.r.n.  18.Feeding nutrition Jevity 1.2, goal of 90 mL by all continuous      feeding to run 7:00 p.m. to 7:00 a.m. via PEG tube for 12 hours per      day.  19.Bolus feeding with Jevity 1.2 360 ml t.i.d.  20.Free water flushes 30 ml before and after medications.  21.Water flushes 120 ml every 4 hours via G-tube, check every shift.      Check tube placement every shift.  22.New medication added is cephalexin Keflex 500 mg via G-tube q.6 h      for 5 days.   HOSPITAL COURSE:  The following issues were addressed during the  hospitalization.  1. Urinary tract infection.  The patient was started on Rocephin and      around admission urine culture grew E coli which was sensitive to      cephazolin as well as ceftriaxone.  The patient's fever resolved      and Rocephin was converted into Keflex on which the  patient is      going home with.  The patient remained hemodynamically stable      through the hospitalization.  2. Questionable right lower lobe pneumonia.  The patient did not have      any cough or other symptoms of pneumonia, most likely fever was      secondary to urinary tract infection.  However, we will discharge      the patient on Keflex that should take care of if any component of      pneumonia is there.  Blood culture was negative.  The patient did      not have any acute respiratory distress, was breathing comfortably      and had normal saturations throughout the hospitalization.  3. Quadriplegia at baseline.  4. History of seizure disorder at baseline.   DISPOSITION:  The patient is going to go back to skilled nursing  facility, and he will follow up with Dr. Leanord Hawking in one week's time for  basic metabolic panel and CBC.  Also, if given that the patient's urine  culture frequently grows more than 100,000 E colli, and he develops  intermittent fever, if this happens and the patient has a recurrent  admission, Infectious Disease consult can be carried out for UTI  prophylaxis and  long-term.  This can also be evaluated by Dr. Leanord Hawking as  an outpatient.   CONSULTATION PERFORMED:  None.   IMAGING PERFORMED:  As mentioned in hospital course.   PROCEDURE PERFORMED:  None.   Total time spent in discharge of this patient around one hour.      Theodosia Paling, MD  Electronically Signed     NP/MEDQ  D:  08/21/2008  T:  08/21/2008  Job:  [id number]   cc:   Maxwell Caul, M.D.

## 2010-08-13 NOTE — Discharge Summary (Signed)
[name], [name] NO.:  [id number]   MEDICAL RECORD NO.:  [id number]          PATIENT TYPE:  INP   LOCATION:  1621                         FACILITY:  [facility]   PHYSICIAN:  [name] Scott, MD     DATE OF BIRTH:  02/03/1956   DATE OF ADMISSION:  02/11/2006  DATE OF DISCHARGE:  02/14/2006                               DISCHARGE SUMMARY   PRIMARY CARE PHYSICIAN:  [name], M.D., [facility].   DISCHARGE DIAGNOSES:  1. Urinary tract infection sepsis.  2. Fecal impaction/constipation.  3. Microcytic anemia.  4. Vomiting.  5. Seizure disorder.   DISCHARGE MEDICATIONS:  Essentially all the same that the patient was on  prior to this hospital admission at the nursing home.  1. The only addition will be Levaquin, as indicated below.  2. At the nursing home Zyrtec 5 mg via PEG daily.  3. Ferrous sulfate 7.2 mL via PEG daily.  4. Folic acid 1 mg via PEG daily.  5. Lactulose 45 mL via PEG daily.  6. Potassium chloride 40 mEq via PEG daily.  7. Prevacid 30 mg via PEG daily.  8. Phenytoin 100 mg via PEG b.i.d.  9. Prednisone 2.5 mg via PEG b.i.d.  10.Baclofen 10 mg via PEG t.i.d.  11.Sinemet 25/100 mg, one tab via PEG t.i.d.  12.Florinef 0.1 mg via PEG q.h.s.  13.Remeron 45 mg via PEG q.h.s.  14.Jevity 1.2, 240 mL q.4h.  Check residuals every shift.  15.Water 45 mL, flush before and after medications.  16.Levaquin 500mg  IV daily - to complete after 11/24 dose   PROCEDURES PERFORMED:  1. A portable abdominal x-ray on February 13, 2006.  Impression:      Although the exam is moderately limited by motion artifact, there      has been resolution of sigmoid distention and the fecal impaction.  2. On February 12, 2006, a portable abdominal x-ray.  Impression:      Dilated sigmoid colon courses at the midline to the level of the      rectum, where there is prominent stool.  Fecal impaction is      considered the most likely etiology.  A sigmoid  volvulus cannot be      completely excluded, but is considered less likely.  Please      correlate clinically.  3. On February 12, 2006, a portable chest x-ray.  Impression:  Minimal      chronic bronchitic changes.  No acute infiltrate.  4. On February 11, 2006, a chest x-ray, two-view.  Impression:      Chronic bronchitic changes.  No definite acute infiltrate.  A      resolution of infiltrate seen on previous exam.   LABORATORY DATA:  On February 14, 2006, a CBC revealed a hemoglobin of  9.5, hematocrit 28.5, MCV 71.5, white cells of 7.3, platelet count 172.  On admission the patient had a hemoglobin of 12.3, hematocrit 36.8, with  a white cell count of 20.3.  It is unclear what the patient's baseline  hemoglobin is.  On February 13, 2006, his basic metabolic panel was  unremarkable except for a sodium of 134.  His anemia workup revealed an  iron of 27, total iron binding capacity of 190, percent saturation 14,  ferritin  694.  The urinalysis was positive for nitrites and a moderate  amount of leukocytes, as well as many bacteria.  Fecal occult blood  testing was negative.  His stool for C. difficile was negative.  Blood  cultures done on admission x2 have been negative to date, but the final  culture reports have to be followed up as an outpatient.  The urine  culture final report is no growth.   HOSPITAL COURSE:  For the details of the initial part of the admission,  please refer to the history and physical note done by Dr. Theresia Chase  on February 11, 2006.  In summary, Mr. Chase is a 55 year old male, a  nursing home resident, with multiple medical problems as indicated in  the history and physical note.  He was transferred to the hospital with  a history of coughing and questionable aspiration and a history of  vomiting earlier on the day of admission, and also a high-grade fever of  101 degrees.   #1 - URINARY TRACT INFECTION SEPSIS:  The patient was evaluated in the   emergency room and was found to have a low-grade temperature of 99.7  degrees and leukocytosis of 20.3; however, the chest x-ray was negative  for any acute infiltrates, but the urinalysis was positive and  suggestive of a urinary tract infection.  The patient was placed on IV  hydration as well as IV Levaquin.  He did get a dose of Zithromax in the  emergency department.  With these measures, the patient has  progressively done well, with no symptoms of dysuria.  His leukocytosis  has progressively reduced to normal.  The patient has been afebrile and  not complaining.  He is to complete a one-week course of Levaquin.  The  patient was suspected to have an aspiration pneumonia; however, the  chest x-ray was negative for the same.   #2 - FECAL IMPACTION AND CONSTIPATION:  The patient had a history of  vomiting in the nursing home and even on the first day of  hospitalization the patient had a couple of episodes of vomiting and his  PEG tube feeds were held.  X-rays of the abdomen were obtained, the  report of which is as above.  The patient was given a dose of MiraLax  via the PEG tube, with good effect and subsequent x-rays revealed a  resolution of the sigmoid distention, as well as the constipation.  The  PEG tube feeds were resumed at 40 mL an hour, which he has tolerated  well.  There was an episode of the PEG tube being clogged on February 14, 2006, which was subsequently releaved and the patient is tolerating  PEG feeds.  He is to continue with the same feed schedule that he was on  at the nursing home.   #3 - MICROCYTIC ANEMIA:  We are unsure of the baseline hemoglobin that  the patient has, but there has been a  drop in his hemoglobin as  indicated, which might have been secondary to the correction of the  hemoconcentration.  There was no obvious focus of bleeding. This has to  be followed up with repeat CBCs and further workup, as deemed necessary as an outpatient.  This  seems to be suggestive of anemia of chronic  disease.  The patient's  fecal occult blood testing was also negative.   #4 - VOMITING, WHICH MAY HAVE BEEN MULTIFACTORIAL, SECONDARY TO HIS  SEPSIS OR SECONDARY TO HIS FECAL IMPACTION; HOWEVER, THAT HAS RESOLVED:  The patient is tolerating PEG feeds, and he is to continue his  management in the nursing home.   #5 - HIS OTHER CHRONIC MEDICAL CONDITIONS:  The patient is to continue  with the same medications that he was on at the nursing home.  The daily  care has been discussed with the patient's mom, who was at the bedside  on most days.  I have called her today also on the telephone, and have  discussed the patient's diagnoses, prognosis, treatment and current  management, and she verbalizes understanding and is okay for the patient  to be discharged back to the nursing home.      [name] Scott, MD  Electronically Signed     AH/MEDQ  D:  02/14/2006  T:  02/14/2006  Job:  [id number]   cc:   [name], M.D.

## 2010-08-13 NOTE — Discharge Summary (Signed)
[name], [name]             ACCOUNT NO.:  [id number]   MEDICAL RECORD NO.:  [id number]          PATIENT TYPE:  INP   LOCATION:  1415                         FACILITY:  [facility]   PHYSICIAN:  Elliot Cousin, M.D.    DATE OF BIRTH:  11/26/1955   DATE OF ADMISSION:  12/21/2004  DATE OF DISCHARGE:  12/25/2004                                 DISCHARGE SUMMARY   PRIMARY CARE PHYSICIAN:  Maxwell Caul, M.D.   DISCHARGE DIAGNOSES:  1.  Right lower lobe pneumonia.  2.  Urinary tract infection with Acinetobacter.  3.  Dilantin toxicity.  4.  Seizure disorder.  5.  Adrenal insufficiency.  6.  Anemia status post two units of packed red blood cell transfusion.  7.  History of dysphagia, swallow evaluation revealed no signs of      aspiration.   DISCHARGE MEDICATIONS:  1.  Primaxin 500 mg IV q.8h. for an additional 6 days.  2.  Albuterol nebulizer q.6h. for an additional 48 hours and then p.r.n.  3.  Prednisone dose taper 10 mg 6-day course.  4.  Florinef 0.1 mg daily.  5.  Baclofen 10 mg t.i.d.  6.  Remeron 45 mg nightly.  7.  Lactulose 45 cc daily.  8.  Artificial Tears 1 drop in each eye b.i.d.  9.  Dilantin suspension 100 mg b.i.d.  10. Marinol 5 mg b.i.d. with food.  11. Ferrous sulfate 325 mg b.i.d.  12. Folic acid 1 mg daily.  13. Tramadol 15 mg q.8h. p.r.n.  14. Sinemet 25/100 t.i.d.  15. K-Dur 40 mEq daily.   PROCEDURE PERFORMED:  Right upper extremity PIC.   HISTORY OF PRESENT ILLNESS:  The patient is a 55 year old man with a past  medical history significant for viral encephalopathy, quadriparesis, seizure  disorder, and adrenal insufficiency who presented to the emergency  department on December 21, 2004 with a chief complaint of vomiting, fever,  and chest congestion. The patient is a resident of [facility]. When he was evaluated in the emergency department, his chest x-ray  revealed a right lower lobe infiltrate. The patient was therefore  admitted  for further evaluation and management.   HOSPITAL COURSE:  #1.  RIGHT LOWER LOBE PNEUMONIA. The patient was started  on gentle volume repletion with normal saline. Blood cultures were ordered  x2. The patient was then started on broad-spectrum antibiotic therapy with  Primaxin and vancomycin. The patient has an allergy to cephalosporins. The  patient was also treated with albuterol and Atrovent nebulizers. He did have  a mild cough during the hospital course; however, this was short lived. The  blood cultures have remained negative during the hospital course. After two  days of antibiotic therapy with vancomycin, the vancomycin was discontinued  given no obvious staph infection. On admission, the patient was febrile with  a temperature of 102.6. However, over the past 48 hours, the patient had  been completely afebrile. His white blood cell count on admission was 13.6.  however, it did increase to 16.4. At the time of hospital discharge, the WBC  normalized to 8.9.  The patient has received four and a half days of  antibiotic therapy with Primaxin. He does have a right upper extremity PIC  inserted. He is currently stable and in no acute distress. The patient will  therefore be discharged to [facility] on six additional  days of intravenous Primaxin.   #2.  URINARY TRACT INFECTION. The patient's urinalysis revealed bacteria and  WBCs. The urine culture revealed greater than 100,000 colonies of  Acinetobacter. Prior to the identification of the urine bacteria, the status  was identified as gram-negative rods. The patient was started on Cipro given  the finding. However, when the sensitivities came back, the Acinetobacter  was resistant to Cipro. The bacterium was sensitive to Primaxin. Therefore,  the Cipro will be discontinued at the time of hospital discharge. The  patient will continue Primaxin as above.   #3.  ANEMIA. The patient's hemoglobin on admission  was 9.4. The following  day, the patient's hemoglobin fell to 7.8. There was no obvious overt signs  of GI bleeding or GU bleeding. Iron studies were ordered and revealed a  ferritin of 245, folate of 12.2, vitamin B12 of 657, reticulocyte count of  36.3, total iron of 67, TIBC of 143, and percent saturation of 47. Stool  guaiacs were ordered. However, apparently they were never performed. The  patient was typed and crossed and transfused 2 units of packed red blood  cells. Following the transfusion, the patient's hemoglobin improved to 9.7.   #4.  DILANTIN TOXICITY. The patient has chronic seizure disorder and is  therefore treated with Dilantin. On admission, his dilantin level was  supratherapeutic at 37.6. This may have been a source of the patient's  vomiting. The vancomycin was held for several days. Once the dilantin level  became therapeutic, the medication was restarted. The patient had been  previously treated with 200 mg of Dilantin b.i.d. He will, however, be  discharged to home on Dilantin 100 mg b.i.d. His dilantin level to date is  19. He will need to have a Dilantin level rechecked in three to five days.   #5.  RENAL INSUFFICIENCY. The patient's blood pressures were low normal on  admission. He was started on stress doses of Solu-Cortef at 100 mg q.12h.  Over the past few days, the Solu-Cortef has been weaned down to 25 mg q.12h.  The patient was also maintained on Florinef once daily. The patient's blood  pressures have improved. The Toprol will be held for now. The patient will  be discharged home on a prednisone dose taper 10 mg 6-day course and  Florinef 0.1 mg daily as previously prescribed.   #6.  HISTORY OF DYSPHAGIA. The patient demonstrated coughing during eating  and drinking. There was some concern regarding dysphagia. Speech therapist  was consulted. He performed a swallowing study. The swallowing study was negative for signs of aspiration with thin liquids.  The patient was  therefore given a mechanical soft diet with thin liquids during the hospital  course.   DISCHARGE LABORATORY DATA:  Phenytoin 19.0. WBC 8.9, hemoglobin 9.7,  hematocrit 29.5, MCV 71.2, platelets 267. Sodium 136, potassium 3.3,  chloride 102, CO2 29, glucose 115, BUN 10, creatinine 0.4, calcium 8.1 (the  patient will be repleted with potassium chloride as ordered).   DISCHARGE DISPOSITION:  The patient is currently stable and ready for  hospital discharge. The patient will be transferred to [facility]. The patient's Foley catheter was discontinued today. The nursing  staff at the nursing facility should watch for urinary retention. The  patient does have a right upper extremity PIC. He will need to receive  Primaxin at 500 mg q.8h. for an additional 6 days as previously stated. When  the antibiotic therapy course is completed, the [facility] should be discontinued.   The patient will need to have a Dilantin level, BMET, and CBC drawn in three  to five days to assess the Dilantin level, potassium, and hemoglobin and  hematocrit. Further followup and management per his primary care physician,  Dr. Leanord Hawking.      Elliot Cousin, M.D.     DF/MEDQ  D:  12/25/2004  T:  12/25/2004  Job:  [id number]   cc:   Maxwell Caul, M.D.  Fax: [phone]

## 2010-08-13 NOTE — H&P (Signed)
[name], Chase             ACCOUNT NO.:  [id number]   MEDICAL RECORD NO.:  [id number]          PATIENT TYPE:  INP   LOCATION:  1330                         FACILITY:  [facility]   PHYSICIAN:  [name], M.D.   DATE OF BIRTH:  1956/02/08   DATE OF ADMISSION:  05/28/2005  DATE OF DISCHARGE:                                HISTORY & PHYSICAL   PRIMARY CARE PHYSICIAN:  Dr. Baltazar Chase of [facility].   CHIEF COMPLAINT:  Fever to 103 degrees Fahrenheit rectal.   HISTORY OF PRESENT ILLNESS:  The patient is 55 year old male with past  medical history significant for viral encephalopathy and quadriplegia. He  also has a seizure disorder and adrenal insufficiency. Was in his usual  state of health until today when he was noticed to have fever to 103 in  nursing home. He was transferred to the emergency department for further  evaluation and workup. Notably, he was hospitalized back in September the  right lower lobe pneumonia and on evaluation in emergency department, was  found to have recurrent pneumonia in the right lower lobe. Admitted for  further evaluation and treatment.   PAST MEDICAL HISTORY:  1.  Quadriplegia secondary to viral encephalitis.  2.  Seizure disorder.  3.  Anemia.  4.  Hypertension.  5.  Depression.  6.  Chronic constipation.  7.  Dysphasia.  8.  Encephalopathy.  9.  Adrenal insufficiency.   FAMILY HISTORY:  The patient's mother reports that there is a history of  lung and prostate cancer in the family.   SOCIAL HISTORY:  The patient is single. No past medical history of smoking,  alcohol or drugs. He is a current resident at skilled nursing facility  [facility]). He is a full code.   DRUG ALLERGIES.:  1.  ASPIRIN.  2.  CEPHALOSPORINS.  3.  PENICILLIN.  4.  NONSTEROIDAL ANTI-INFLAMMATORY DRUGS.   CURRENT MEDICATIONS:  1.  K-Dur 20 mEq 2 tablets p.o. daily.  2.  Lactulose 45 mg daily.  3.  Folic acid 1 milligram  daily.  4.  Multivitamin daily.  5.  Prednisone 2.5 milligrams b.i.d.  6.  Ferrous sulfate 325 milligrams b.i.d.  7.  Liquid Tears p.r.n.  8.  Dilantin suspension 100 mL b.i.d.  9.  Baclofen 10 milligrams t.i.d.  10. Sinemet 25/100 t.i.d.  11. Florinef 0.1 milligrams q.h.s.  12. Remeron 45 milligrams q.h.s.  13. __________ shakes t.i.d.  14. Ultram 50 milligrams q.8 h p.r.n.  15. Albuterol nebulizers q.6 h p.r.n.  16. Tylenol 650 milligrams q.4 h p.r.n.   REVIEW OF SYSTEMS:  The patient does endorse headache and fever and chills.  Had some rhinitis and cough. No change in his bowel habits.   PHYSICAL EXAM:  VITAL SIGNS:  Temperature 103.7, pulse 112, respirations 20,  blood pressure 111/68.  GENERAL: Well-developed, thin male in no acute distress.  HEENT: Normocephalic, atraumatic. PERRL. There is arcus senilis bilaterally.  EOMI. Oropharynx is clear.  NECK: Supple, no thyromegaly, no lymphadenopathy, no jugular venous  distension.  CHEST: Crackles to the right base. No wheezes.  HEART: Tachycardiac rate, regular rhythm. No murmurs, rubs, gallops.  ABDOMEN: Soft, nontender, nondistended. Normoactive bowel sounds.  EXTREMITIES: The patient has a fracture to his left hand. No clubbing,  edema, cyanosis.  SKIN: Diaphoretic.  NEUROLOGIC: The patient is alert and oriented. He has dysphagia/dysarthria  but responds appropriately to questions. He moves all extremities x4 with  decreased strength.   DATA REVIEW.:  CHEST X-RAY:  Shows right lower lobe air space disease  consistent with pneumonia.   LABORATORY DATA:  Reveals a white blood cell count 9.1, hemoglobin 11.8,  hematocrit 36.8, platelets 199, MCV 73, ANC 7.5. Sodium is 131, potassium  3.6, chloride 100, bicarb 27, BUN 9, creatinine 0.6, calcium 8.5, total  protein 7.5, albumin 3.4, glucose 104, AST 52, ALT 35, alkaline phosphatase  141, total bilirubin 0.6. Urinalysis reveals 30 mg/dL protein and negative  for nitrites and  leukocyte esterase. There are 3-6 red blood cells.   ASSESSMENT/PLAN:  1.  Recurrent right lower lobe pneumonia: This likely is a result of some      silent chronic aspiration. Given this we will get a swallowing      evaluation. We will place him on Primaxin given his penicillin allergy      and check a urinary Streptococcus pneumonia antigen. Would continue with      aggressive pulmonary toilette and bronchodilator therapy. Would also get      a CT scan of the chest to rule out a structural abnormality in his lungs      predisposing him toward recurrent infections.  2.  Hyponatremia: The patient has a mild hyponatremia. We will hydrate with      normal saline.  3.  Mild elevation of alkaline phosphatase: Unclear etiology. We will      monitor this. Patient is not currently complaining of any abdominal      pain.  4.  Seizure disorder: We will check the patient's Dilantin level and      continue him on his usual dose of Dilantin.  5.  Quadriplegia: We will continue the patient's usual outpatient medical      regimen.  6.  Adrenal insufficiency: We will give stress dose steroids with      hydrocortisone. We will continue the Florinef as well.  7.  Prophylaxis: We will initiate gastrointestinal and deep venous      thrombosis prophylaxis.           ______________________________  [name], M.D.     CR/MEDQ  D:  05/29/2005  T:  05/30/2005  Job:  [id number]   cc:   [name], M.D.  Fax: [phone]

## 2010-08-13 NOTE — H&P (Signed)
NAMEKIVEN, [name]             ACCOUNT NO.:  [id number]   MEDICAL RECORD NO.:  [id number]          PATIENT TYPE:  INP   LOCATION:  1415                         FACILITY:  [facility]   PHYSICIAN:  Isidor Holts, M.D.  DATE OF BIRTH:  04/27/1955   DATE OF ADMISSION:  12/21/2004  DATE OF DISCHARGE:                                HISTORY & PHYSICAL   PMD:  Maxwell Caul, M.D.   CHIEF COMPLAINT:  Fever/vomiting.   HISTORY OF PRESENT ILLNESS:  This is a 55 year old male. For past medical  history, see below. Resident of [facility], noted to have a  low grade pyrexia on December 20, 2004 evening. He vomited once, he was  found to have crackles in the chest and was referred to the emergency  department.   PAST MEDICAL HISTORY:  1.  Viral encephalopathy approximately 3 years ago.  2.  Quadriplegia secondary to #1.  3.  Seizure disorder.  4.  Hypertension.  5.  Adrenal insufficiency.  6.  GERD.  7.  Iron deficiency anemia.   MEDICATIONS:  1.  Baclofen 10 mg p.o. t.i.d.  2.  Dilantin suspension 200 mg p.o. b.i.d.  3.  Marinol 5 mg p.o. b.i.d. with food.  4.  Ferrous sulfate 325 mg p.o. b.i.d.  5.  Florinef 0.1 mg p.o. q.h.s.  6.  Mirtazapine 45 mg p.o. q.h.s.  7.  Tramadol 15 mg p.o. p.r.n. q. 8 hourly.  8.  Toprol XL 25 mg, 1/2 tab p.o. daily.  9.  Sinemet (25/100) 1 p.o. t.i.d.  10. Lactulose 45 mL p.o. daily.  11. K-Dur 40 mEq p.o. daily.   ALLERGIES:  ASPIRIN, NSAIDs, PENICILLIN, CEPHALOSPORINS.   REVIEW OF SYMPTOMS:  As per HPI and chief complaint. Also chronic  constipation and depression, otherwise negative.   SOCIAL HISTORY:  The patient is a resident of [facility],  nonsmoker, nondrinker.   FAMILY HISTORY:  Noncontributory.   PHYSICAL EXAMINATION:  VITAL SIGNS:  Temperature 102.6, pulse 95 per minute  and regular, respiratory 20, BP 90/50. Pulse oximeter 100% on 4 liters of  oxygen.  GENERAL:  The patient appears alert, no  shortness of breath at rest.  HEENT:  No clinical pallor, no jaundice, no conjunctival injection.  NECK:  Appeared supple. JVP not seen. No palpable lymphadenopathy, no  palpable goiter  CHEST:  Diminished air entry right base, no wheezes, no crackles.  HEART:  Sounds 1 and 2 heard, normal, regular, no murmurs.  ABDOMEN:  Full, soft and nontender. There is no palpable organomegaly, no  palpable masses. Normal bowel sounds.  EXTREMITIES:  Lower extremity examination, no pitting edema. Lower  extremities appear atrophic.  CENTRAL NERVOUS SYSTEM:  The patient is quadriplegic with contractures in  upper and lower extremities.  MUSCULOSKELETAL:  Not fully examined.   LABORATORY DATA:  CBC, wbc 13.6, hemoglobin 9.4, hematocrit 26.6, MCV 69,  platelets 299. Electrolytes, sodium 132, potassium 3.5, chloride 102, CO2  21, BUN 11, creatinine 0.7, glucose 73. AST 48, ALT 14, alkaline phosphatase  164. Urinalysis, WCC 32.6, RBC 0-2, bacteria many, leukocyte esterase  moderate.  Chest x-ray December 05, 2004 shows right lower lobe pneumonia/COPD.   ASSESSMENT:  1.  Right lower lobe pneumonia. Possibly secondary to aspiration, against      the background of known dysphagia and seizure disorder. The patient is a      nursing home resident.  Therefore, we shall utilize broad-spectrum      antibiotic therapy, with vancomycin and Primaxin. We shall admit patient      and administer oxygen supplementation and p.r.n. nebulizers. For      completeness, will do blood cultures.   1.  Vomiting. Likely secondary to #1 above. Will manage with p.r.n.      antiemetics.   1.  Possible dysphagia. Will need speech therapy evaluation. Meanwhile      dysphagia diet and aspiration precautions.   1.  History of seizure disorder. Will check dilantin levels, continue pre-      admission dosage of Dilantin, utilize seizure precautions.   1.  Urinary tract infection. This should be adequately covered by  Primaxin.   1.  Quadriplegia. Prophylaxis of decubiti will be needed. Will continue      baclofen.   1.  History of adrenal insufficiency. Will commence patient on stress      steroids.  Further management will depend on clinical course.   Dictated December 21, 2004 by Dr. Isidor Holts, [facility]  hospitalist.      Isidor Holts, M.D.  Electronically Signed     CO/MEDQ  D:  12/21/2004  T:  12/21/2004  Job:  [id number]   cc:   Maxwell Caul, M.D.  Fax: [phone]

## 2010-08-13 NOTE — Op Note (Signed)
[name], [name]             ACCOUNT NO.:  [id number]   MEDICAL RECORD NO.:  [id number]          PATIENT TYPE:  AMB   LOCATION:  ENDO                         FACILITY:  [facility]   PHYSICIAN:  Graylin Shiver, M.D.   DATE OF BIRTH:  03/25/1956   DATE OF PROCEDURE:  12/01/2004  DATE OF DISCHARGE:                                 OPERATIVE REPORT   PROCEDURE:  Flexible sigmoidoscopy.   INDICATIONS FOR PROCEDURE:  Colonoscopy was planned due to heme-positive  stool and iron deficiency anemia.   Informed consent was obtained after explanation of the risks of bleeding,  infection and perforation.   PREMEDICATION:  Fentanyl 12.5 mcg IV, Versed 1 mg IV.   PROCEDURE:  With the patient in the left lateral decubitus position, a  rectal exam was performed. No masses were felt. There was stool on the  gloved finger. The Olympus colonoscope was inserted into the rectum and  advanced into the sigmoid colon. The rectum and sigmoid were full of stool.  I could not see. The prep was inadequate. The scope was brought out. The  patient will have to be rescheduled and reprepped.           ______________________________  Graylin Shiver, M.D.     SFG/MEDQ  D:  12/01/2004  T:  12/01/2004  Job:  [id number]   cc:   Maxwell Caul, M.D.  [address]  [place]  [place] [address]  Fax: [phone]

## 2010-08-13 NOTE — Discharge Summary (Signed)
NAMEJOSHUWA, MESSEX NO.:  [id number]   MEDICAL RECORD NO.:  [id number]          PATIENT TYPE:  INP   LOCATION:  1330                         FACILITY:  [facility]   PHYSICIAN:  Hillery Aldo, M.D.   DATE OF BIRTH:  Apr 29, 1955   DATE OF ADMISSION:  05/28/2005  DATE OF DISCHARGE:  06/08/2005                                 DISCHARGE SUMMARY   ADDENDUM:   PRIMARY CARE PHYSICIAN:  Maxwell Caul, M.D., [facility].   For complete list of the Diagnoses, Secondary Diagnoses, History of Present  Illness, and Hospital Course through June 05, 2005, please see the  previously dictated Discharge Summary done by Dr. Michaelyn Barter.   DISCHARGE MEDICATIONS:  1.  Protonix 40 mg daily via PEG.  2.  Baclofen 10 mg 3 times a day via PEG.  3.  Sinemet 25/100 three times a day via PEG.  4.  Ferrous sulfate 300 mg in 5 mL liquid twice daily via PEG.  5.  Florinef 0.1 mg nightly via PEG.  6.  Folic acid 1 mg daily via PEG.  7.  Lactulose 45 mL daily via PEG.  8.  Remeron 45 mg nightly via PEG.  9.  Multivitamin liquid 5 mL via PEG daily.  10. Dilantin 125 mg/5 mL suspension, 100 mg twice daily.  11. Prednisone 2.5 mg via PEG twice daily.  12. Potassium 40 mEq in 30 mL daily via PEG.  13. Jevity 1.2 nutritional supplement 240 mL q. 4 h.  14. Free water 120 mL q. 4 h via PEG.  15. Artificial Tears p.r.n.  16. Ultram 50 mg q. 8 h p.r.n.  17. Albuterol nebulizer treatments q. 6 h p.r.n.  18. Tylenol 650 mg q. 4 h p.r.n.   HOSPITAL COURSE June 06 2005 TO June 08 2005:  The patient's right lower  lobe pneumonia has been treated with 10 days of therapy with Primaxin.  It  is thought this pneumonia was secondary to an aspiration event.  He should  maintain aspiration precautions and remain n.p.o.  Given the fact that he  has had recurrent pneumonias, the radiologist did recommended that he have a  followup CT scan to rule out a malignant process.  He did have an  abnormal  PET scan, but the findings were not diagnostic. In other words, the findings  could be consistent with infection or malignancy.  Followup CT was obtained  on the day of discharge and showed persistent air space consolidation in the  posterior right lower lobe with adjacent interstitial infiltrate most  consistent with pneumonia.  There were smaller nodular densities which were  stable and thought to be infectious versus noncalcific granulomata or  neoplasm.  Again, followup is recommended, and the patient should have a  repeat CT scan in 3 to 6 months to ensure stability.   The patient has known severe dysphagia and had a PET on June 06, 2005.  He  is tolerating tube feeds, and we have switched him over to bolus feeding in  anticipation of discharge.   The only other active issue was his  hypokalemia which has been repleted.  His discharge potassium level is stable at 4.  All of his other chronic  medical problems are stable.  He is, therefore, stable for discharge to a  skilled nursing facility today.   The patient should follow up with Dr. Leanord Hawking in one week's time.           ______________________________  Hillery Aldo, M.D.     CR/MEDQ  D:  06/08/2005  T:  06/08/2005  Job:  [id number]   cc:   Maxwell Caul, M.D.  Fax: [phone]

## 2010-08-13 NOTE — H&P (Signed)
[name]             ACCOUNT NO.:  [id number]   MEDICAL RECORD NO.:  [id number]          PATIENT TYPE:  OBV   LOCATION:  1621                         FACILITY:  [facility]   PHYSICIAN:  Theresia Bough, MD       DATE OF BIRTH:  1955-10-26   DATE OF ADMISSION:  02/11/2006  DATE OF DISCHARGE:                                HISTORY & PHYSICAL   PRESENTING COMPLAINT:  Coughing.  Patient is not able to give any history.  History is obtained from the records.   HISTORY OF PRESENT ILLNESS:  Nursing home reported that the patient vomited  earlier today and probably aspirated.  Patient is a high risk for  aspiration.  Patient has a G-tube.  Also patient had a temperature of about  101 at the nursing home and most likely was given Tylenol at about 6 p.m.  Patient is not able to give me any history, because of his past history of  encephalopathy.  Patient is not able to answer to the review of systems.  The reason for bringing the patient to the hospital was to check patient for  any possible infection, probably secondary to the aspiration of any other  source of infection that may be responsible for the temperature of 101 at  the nursing home.  His temperature is back to 99.7 here at the hospital.   ALLERGIES:  ASPIRIN, NSAIDs, and CEPHALOSPORINS.   HOME MEDICATIONS:  1. __________ 5 mg daily.  2. Ferrous sulfate 7.2 mL daily.  3. Folic acid 1 mg daily.  4. Lactulose 45 mL daily.  5. KCl 40 mEq daily.  6. Prevacid 30 mg daily.  7. Phenytoin 100 mg twice daily.  8. Prednisone 2.5 mg twice daily.  9. Baclofen 10 mg three times a day.  10.Florinef 0.1 mg at bedtime.  11.Remeron 45 mg at bedtime.  12.Sinemet 35/100 one tablet three times daily.   PAST MEDICAL HISTORY:  1. History of adrenal insufficiency.  2. History of GERD.  3. Hypertension.  4. Iron-deficiency anemia.  5. History of pneumonia.  6. Seizure disorder.  7. History of quadriplegia.   SOCIAL HISTORY:  He lives  in a nursing home, does not smoke cigarettes, does  not use any drugs.   FAMILY HISTORY:  Noncontributory.   PHYSICAL EXAMINATION:  GENERAL APPEARANCE:  He is not in any acute distress.  VITAL SIGNS:  A male patient with temperature of 99.7, blood pressure  100/68, pulse rate 93, respirations 22, oxygen saturation 98% on room air.  HEENT:  Conjunctivae are pink.  Patient has no jaundice.  Moist mucous  membranes.  NECK:  Supple.  CHEST:  __________ respiration.  Auscultation shows clear breath sounds.  CARDIOVASCULAR:  Normal heart sounds with no murmur and no gallop.  Pulses  are palpable in his limbs.  ABDOMEN:  Abdomen shows presence of a G-tube.  There is no tenderness.  There is no distension.  EXTREMITIES:  No edema.  His hands are contracted.  CENTRAL NERVOUS SYSTEM:  Patient is alert.  He is arousable.  Power is 2/4  on the right  leg and 1/4 on the left leg.  Patient's speech is not audible.   LABORATORY DATA:  Initial labs showed wbc of 20.3, hemoglobin 12.3,  hematocrit 36.8, platelets 195.   Chest x-ray shows no acute infiltrates.   ASSESSMENT:  1. Leukocytosis.  This is clearly aspiration.  I will start the patient on      IV Levaquin 500 mg daily.  Patient has been given Zithromax 500 mg by      the ED physician.  I am going to check patient's urinalysis.  I am also      going to follow up his blood and urine cultures.  I am going to repeat      the patient's chest x-ray in the a.m.  2. History of adrenal insufficiency.  3. Hypertension.  4. Anemia.  5. Seizure disorder.   PLAN:  Continue all his nursing home medications.  Patient is going to be  admitted for observation.  He is going to be on Tylenol 650 mg q.4h. p.r.n.  for fever with temperature greater than 101.      Theresia Bough, MD  Electronically Signed     GA/MEDQ  D:  02/11/2006  T:  02/12/2006  Job:  [id number]

## 2010-08-13 NOTE — Discharge Summary (Signed)
NAMEGRYFFIN, MAGGART             ACCOUNT NO.:  [id number]   MEDICAL RECORD NO.:  [id number]          PATIENT TYPE:  INP   LOCATION:  1330                         FACILITY:  [facility]   PHYSICIAN:  Michaelyn Barter, M.D. DATE OF BIRTH:  1956/03/12   DATE OF ADMISSION:  05/28/2005  DATE OF DISCHARGE:                                 DISCHARGE SUMMARY   [facility]CT[facility]   FINAL DIAGNOSES:  1.  Right lower lobe pneumonia.  2.  Severe dysphagia.  3.  Hypokalemia.   SECONDARY DIAGNOSES:  1.  History of seizure disorder.  2.  History of quadriplegia.   HISTORY OF PRESENT ILLNESS:  [name] is a 55 year old gentleman with a  past medical history of quadriplegia, secondary to viral encephalopathy, who  on the date of admission, was noted to have had a fever of 103 at the  nursing home that he resides at.  Therefore, he was transferred to the  emergency department for evaluation.   PAST MEDICAL HISTORY:  Please see that dictated by Dr. Denny Peon on  May 29, 2005.   HOSPITAL COURSE:  1.  Right lower lobe pneumonia.  Right lower lobe pneumonia was believed to      be secondary to aspiration.  Following the patient's admission into the      hospital, he underwent a chest x-ray which revealed a right lower lobe      air-space disease, concerning for pneumonia.  Following this, a CT scan      of the chest was done which revealed a spiculated, 3-cm opacity in the      right lower lobe, mild mediastinal adenopathy and other right lung      pulmonary nodules.  Findings were worrisome for neoplasm.  Inflammatory      process was also a consideration.  It was recommended that a PET CT be      ordered.  On May 31, 2005, a nuclear medicine PET CT was ordered and      the final impression was that there was a large area of consolidation      within the right lower lobe which demonstrated an abnormal, increased      FDG uptake.  It was noted that there may  be some concerns for      malignancy.  However, in the setting of pneumonia, the finding was      nonspecific.  The radiologist went on to state that an infectious      process such as a lobar pneumonia could have a similar appearance on CT      and PET imaging, and given the fact that the patient had had a history      of recurrent pneumonia within the same area since December 22, 2005,      there was more of a concern with a malignancy.  I went down to radiology      and I discussed the findings with Dr. Jeronimo Greaves, the radiologist.  We      spent some time reviewing the CT scans and the PET CT, and he  stated      that it appeared more likely that the patient had developed some sort of      an aspiration process as opposed to a pneumonia.  Therefore, I ordered a      speech therapy consultation and the patient underwent a modified barium      swallow study on June 02, 2005.  The patient was on IV primaxin      throughout his hospitalization.  However he continued to spike fevers.      This was very alarming to me, I was concerned that we were not      addressing the primary source of his pneumonia, which leads me to #2,      which is dysphagia.  2.  Dysphagia.  On June 02, 2005, the patient underwent a modified barium      swallow study, which showed very severe dysphagia with a large volume      aspiration of all consistencies tested, including semi-solid food, thin      nectar and honey liquids.  The aspiration was noted to have been caused      by spill-over from the piriform sinuses secondary to pharyngeal      weakness.  It was recommended that the patient be made n.p.o., and that      strong consideration be given to the placement of a feeding tube.  I      discussed it with the patient and he initially was very angry and      refused to have a PEG tube placed.  Likewise, I discussed it with the      patient's mother and father.  I was then called by nursing a day later      and  was told that the patient, his mother and father, all agreed that a      PEG would be the best treatment option for the patient at this time.  I      spoke with Dr. Deanne Coffer, who agreed to place a PEG tube on Monday, June 06, 2005.  The patient has had an epigastric tube placed June 05, 2005,      in preparation for his procedure; he has also been started on Jevity      tube feedings on June 05, 2005, secondary to his n.p.o. status and in      preparation for his PEG tube.  3.  Hypokalemia.  The patient has been noted to have had a low potassium on      June 05, 2005, in particular.  He has received supplementation for      this.  4.  Anemia.  The patient's hemoglobin has been noted to have been 11.8 at      the time of his admission; however, by May 31, 2005, it declined to      9.8.  There may have been some dilutional effect secondary to hydration;      however, by June 01, 2005, it had declined to 9.5.  Iron studies were      ordered, and the patient's iron level was noted to be normal at 60.  His      TIBC was 171 and his % saturation 35.  The iron and % sat were within      the normal range.  Likewise, folate level was ordered and was found to      be high at 1089.  The patient had  not been noted to have produced any      bright red blood per rectum.  I did order stool studies, however.  On      May 31, 2005, they were reported to be negative.  This will have to be      monitored throughout the remaining portion of his hospitalization.      However, as of today, the patient's hemoglobin has been noticed to be      10.8 and likewise, on June 03, 2005, it was noted to be 10.5.  5.  History of seizure disorder.  The patient has not manifested any      seizures throughout the course of his hospitalization.  He has been      maintained on his previously prescribed regimen of antiseizure      medications.   We will also check his electrolytes to make sure that we do not  subtract than 11 mEq per day of sodium.  The patient also needs close monitoring of  his mental status.   OUTSTANDING ISSUES:  This brings me up to June 05, 2005.  Issues needing to  be addressed prior to the patient being discharged from the hospital are as  follow.  1.  The patient will need to have  PEG tube placed prior to his discharge      from the hospital.  With regards to the patient's aspiration pneumonia,      he has not had any fevers over the past few days and his white blood      cell count has been well within the normal range.  2.  Right lower lobe pneumonia was believed to be secondary to aspiration.      Michaelyn Barter, M.D.  Electronically Signed     OR/MEDQ  D:  06/05/2005  T:  06/05/2005  Job:  [id number]   cc:   Maxwell Caul, M.D.  Fax: [phone]

## 2010-10-07 ENCOUNTER — Other Ambulatory Visit (HOSPITAL_COMMUNITY): Payer: Self-pay | Admitting: Interventional Radiology

## 2010-10-07 ENCOUNTER — Ambulatory Visit (HOSPITAL_COMMUNITY)
Admission: RE | Admit: 2010-10-07 | Discharge: 2010-10-07 | Disposition: A | Discharge status: Home / Self Care | Payer: Medicare Other | Source: Ambulatory Visit | Attending: Interventional Radiology | Admitting: Interventional Radiology

## 2010-10-07 DIAGNOSIS — Z792 Long term (current) use of antibiotics: Secondary | ICD-10-CM

## 2010-10-07 DIAGNOSIS — Z452 Encounter for adjustment and management of vascular access device: Secondary | ICD-10-CM | POA: Insufficient documentation

## 2010-10-22 ENCOUNTER — Other Ambulatory Visit (HOSPITAL_BASED_OUTPATIENT_CLINIC_OR_DEPARTMENT_OTHER): Payer: Self-pay | Admitting: Internal Medicine

## 2010-10-22 DIAGNOSIS — K56609 Unspecified intestinal obstruction, unspecified as to partial versus complete obstruction: Secondary | ICD-10-CM

## 2010-10-25 ENCOUNTER — Ambulatory Visit (HOSPITAL_COMMUNITY)
Admission: RE | Admit: 2010-10-25 | Discharge: 2010-10-25 | Disposition: A | Discharge status: Home / Self Care | Payer: Medicare Other | Source: Ambulatory Visit | Attending: Internal Medicine | Admitting: Internal Medicine

## 2010-10-25 ENCOUNTER — Encounter (HOSPITAL_COMMUNITY): Payer: Self-pay

## 2010-10-25 DIAGNOSIS — R109 Unspecified abdominal pain: Secondary | ICD-10-CM | POA: Insufficient documentation

## 2010-10-25 DIAGNOSIS — I700 Atherosclerosis of aorta: Secondary | ICD-10-CM | POA: Insufficient documentation

## 2010-10-25 DIAGNOSIS — K56609 Unspecified intestinal obstruction, unspecified as to partial versus complete obstruction: Secondary | ICD-10-CM

## 2010-10-25 DIAGNOSIS — M899 Disorder of bone, unspecified: Secondary | ICD-10-CM | POA: Insufficient documentation

## 2010-10-25 DIAGNOSIS — R1110 Vomiting, unspecified: Secondary | ICD-10-CM | POA: Insufficient documentation

## 2010-10-25 DIAGNOSIS — I2510 Atherosclerotic heart disease of native coronary artery without angina pectoris: Secondary | ICD-10-CM | POA: Insufficient documentation

## 2010-10-25 HISTORY — DX: Quadriplegia, unspecified: G82.50

## 2010-10-25 MED ORDER — IOHEXOL 300 MG/ML  SOLN: 100.0000 mL | Freq: Once | INTRAMUSCULAR | Status: AC | PRN

## 2010-11-20 ENCOUNTER — Emergency Department (INDEPENDENT_AMBULATORY_CARE_PROVIDER_SITE_OTHER): Payer: Medicare Other

## 2010-11-20 ENCOUNTER — Encounter (HOSPITAL_BASED_OUTPATIENT_CLINIC_OR_DEPARTMENT_OTHER): Payer: Self-pay | Admitting: Emergency Medicine

## 2010-11-20 ENCOUNTER — Emergency Department (HOSPITAL_BASED_OUTPATIENT_CLINIC_OR_DEPARTMENT_OTHER)
Admission: EM | Admit: 2010-11-20 | Discharge: 2010-11-20 | Disposition: A | Discharge status: Home / Self Care | Payer: Medicare Other | Attending: Emergency Medicine | Admitting: Emergency Medicine

## 2010-11-20 DIAGNOSIS — Z79899 Other long term (current) drug therapy: Secondary | ICD-10-CM | POA: Insufficient documentation

## 2010-11-20 DIAGNOSIS — R509 Fever, unspecified: Secondary | ICD-10-CM

## 2010-11-20 DIAGNOSIS — K4020 Bilateral inguinal hernia, without obstruction or gangrene, not specified as recurrent: Secondary | ICD-10-CM | POA: Insufficient documentation

## 2010-11-20 DIAGNOSIS — N200 Calculus of kidney: Secondary | ICD-10-CM | POA: Insufficient documentation

## 2010-11-20 DIAGNOSIS — G119 Hereditary ataxia, unspecified: Secondary | ICD-10-CM | POA: Insufficient documentation

## 2010-11-20 DIAGNOSIS — R1110 Vomiting, unspecified: Secondary | ICD-10-CM

## 2010-11-20 DIAGNOSIS — G8250 Quadriplegia, unspecified: Secondary | ICD-10-CM | POA: Insufficient documentation

## 2010-11-20 DIAGNOSIS — Z931 Gastrostomy status: Secondary | ICD-10-CM | POA: Insufficient documentation

## 2010-11-20 LAB — COMPREHENSIVE METABOLIC PANEL
ALT: 20 U/L (ref 0–53)
AST: 46 U/L — ABNORMAL HIGH (ref 0–37)
Albumin: 3.6 g/dL (ref 3.5–5.2)
Alkaline Phosphatase: 139 U/L — ABNORMAL HIGH (ref 39–117)
BUN: 15 mg/dL (ref 6–23)
Chloride: 93 mEq/L — ABNORMAL LOW (ref 96–112)
Potassium: 4 mEq/L (ref 3.5–5.1)
Sodium: 131 mEq/L — ABNORMAL LOW (ref 135–145)
Total Bilirubin: 0.7 mg/dL (ref 0.3–1.2)
Total Protein: 8.4 g/dL — ABNORMAL HIGH (ref 6.0–8.3)

## 2010-11-20 LAB — CBC
HCT: 36.4 % — ABNORMAL LOW (ref 39.0–52.0)
MCH: 24.1 pg — ABNORMAL LOW (ref 26.0–34.0)
MCHC: 33.8 g/dL (ref 30.0–36.0)
RDW: 13.1 % (ref 11.5–15.5)

## 2010-11-20 LAB — URINALYSIS, ROUTINE W REFLEX MICROSCOPIC
Bilirubin Urine: NEGATIVE
Glucose, UA: NEGATIVE mg/dL
Ketones, ur: NEGATIVE mg/dL
Protein, ur: NEGATIVE mg/dL
Urobilinogen, UA: 0.2 mg/dL (ref 0.0–1.0)

## 2010-11-20 LAB — DIFFERENTIAL
Band Neutrophils: 9 % (ref 0–10)
Blasts: 0 %
Lymphocytes Relative: 6 % — ABNORMAL LOW (ref 12–46)
Lymphs Abs: 0.6 10*3/uL — ABNORMAL LOW (ref 0.7–4.0)
Myelocytes: 0 %
Neutrophils Relative %: 74 % (ref 43–77)
Promyelocytes Absolute: 0 %

## 2010-11-20 LAB — URINE MICROSCOPIC-ADD ON

## 2010-11-20 LAB — LACTIC ACID, PLASMA: Lactic Acid, Venous: 1.1 mmol/L (ref 0.5–2.2)

## 2010-11-20 MED ORDER — DEXTROSE 5 % IV SOLN
INTRAVENOUS | Status: AC
  Filled 2010-11-20: qty 50

## 2010-11-20 MED ORDER — IOHEXOL 300 MG/ML  SOLN
100.0000 mL | Freq: Once | INTRAMUSCULAR | Status: AC | PRN
  Administered 2010-11-20: 100 mL via INTRAVENOUS

## 2010-11-20 MED ORDER — ACETAMINOPHEN 160 MG/5ML PO SOLN
960.0000 mg | Freq: Once | ORAL | Status: AC
  Administered 2010-11-20: 960 mg via ORAL
  Filled 2010-11-20: qty 40.6

## 2010-11-20 MED ORDER — DEXTROSE 5 % IV SOLN
1.0000 g | Freq: Once | INTRAVENOUS | Status: AC
  Administered 2010-11-20: 1 g via INTRAVENOUS
  Filled 2010-11-20: qty 1

## 2010-11-20 MED ORDER — VANCOMYCIN HCL IN DEXTROSE 1-5 GM/200ML-% IV SOLN
1000.0000 mg | Freq: Once | INTRAVENOUS | Status: AC
Start: 2010-11-20 — End: 2010-11-20
  Administered 2010-11-20: 1000 mg via INTRAVENOUS
  Filled 2010-11-20: qty 200

## 2010-11-20 NOTE — ED Provider Notes (Signed)
History     CSN: [id number] Arrival date & time: 11/20/2010 12:12 PM  Chief Complaint  Patient presents with  . Fever   HPI History from Ms.  [facility] via phone from [facility] and nursing home report as well as family patient not verbal level V caveat. Patient with fever up to 103.5 onset yesterday patient had vomiting yesterday and complained of abdominal pain  Past Medical History  Diagnosis Date  . Quadriplegia    Adrenal insufficiency seizure disorder anemia UTI Past Surgical History  Procedure Date  . Peg tube placement   . Peripherally inserted central catheter insertion     No family history on file.  History  Substance Use Topics  . Smoking status: Not on file  . Smokeless tobacco: Not on file  . Alcohol Use: No      Review of Systems  Unable to perform ROS  abdominal pain ,fever, vomiting  Physical Exam  BP 99/59  Pulse 112  Temp(Src) 102 F (38.9 C) (Rectal)  Resp 30  SpO2 95%  Physical Exam  Nursing note and vitals reviewed. Constitutional:       Chronically ill appearing  HENT:  Head: Normocephalic and atraumatic.  Eyes: Pupils are equal, round, and reactive to light.  Neck: Normal range of motion.  Cardiovascular: Normal rate and normal heart sounds.   Pulmonary/Chest: Breath sounds normal. He has no wheezes. He has no rales.  Abdominal: Soft.       Bowel sounds decreased PEG tube in place, no surrounding redness or tenderness at PEG tube  Genitourinary: Penis normal.  Musculoskeletal:       Flexion contractures of both upper extremities right upper extremity with PICC line in place no redness or tenderness at site. Both lower extremities with muscular atrophy no redness or tenderness or swelling bilateral buttocks with mild skin breakdown  Neurological:       Follow simple commands    ED Course  Procedures Patient remained hemodynamically stable throughout the ED course. Spoke with Dr.A. Green to discuss blood  cultures and urine cultures pending. She requires close followup at the nursing home. MDM Fever etiology unclear. Cultures pending. the patient is stable for transfer back to the nursing home      Doug Sou, MD 11/20/10 1722

## 2010-11-20 NOTE — ED Notes (Signed)
Pt to ED via EMS from [facility].  Staff reports fever of 103.  Received "Tylenol 10cc at 10am" per SNF record.

## 2010-11-20 NOTE — ED Notes (Signed)
Pt incontinent of loose dark green stool.  Pt cleaned & linens changed.

## 2010-11-22 LAB — URINE CULTURE
Colony Count: NO GROWTH
Culture: NO GROWTH

## 2010-11-23 NOTE — ED Notes (Signed)
+  BC: yeast in aerobic bottle.  Results faxed to [facility] at [phone] by Oran Rein, RN.

## 2010-11-26 LAB — CULTURE, BLOOD (ROUTINE X 2)
Culture  Setup Time: 201208252342
Culture  Setup Time: 201208252342
Culture: NO GROWTH

## 2011-03-11 ENCOUNTER — Emergency Department (HOSPITAL_BASED_OUTPATIENT_CLINIC_OR_DEPARTMENT_OTHER)
Admission: EM | Admit: 2011-03-11 | Discharge: 2011-03-12 | Disposition: A | Discharge status: Home / Self Care | Payer: Medicare Other | Attending: Emergency Medicine | Admitting: Emergency Medicine

## 2011-03-11 ENCOUNTER — Encounter (HOSPITAL_BASED_OUTPATIENT_CLINIC_OR_DEPARTMENT_OTHER): Payer: Self-pay | Admitting: *Deleted

## 2011-03-11 ENCOUNTER — Emergency Department (INDEPENDENT_AMBULATORY_CARE_PROVIDER_SITE_OTHER): Payer: Medicare Other

## 2011-03-11 DIAGNOSIS — Z931 Gastrostomy status: Secondary | ICD-10-CM

## 2011-03-11 DIAGNOSIS — K9423 Gastrostomy malfunction: Secondary | ICD-10-CM

## 2011-03-11 DIAGNOSIS — Y849 Medical procedure, unspecified as the cause of abnormal reaction of the patient, or of later complication, without mention of misadventure at the time of the procedure: Secondary | ICD-10-CM | POA: Insufficient documentation

## 2011-03-11 DIAGNOSIS — Z79899 Other long term (current) drug therapy: Secondary | ICD-10-CM | POA: Insufficient documentation

## 2011-03-11 DIAGNOSIS — Z09 Encounter for follow-up examination after completed treatment for conditions other than malignant neoplasm: Secondary | ICD-10-CM

## 2011-03-11 DIAGNOSIS — G8250 Quadriplegia, unspecified: Secondary | ICD-10-CM | POA: Insufficient documentation

## 2011-03-11 DIAGNOSIS — K560 Paralytic ileus: Secondary | ICD-10-CM

## 2011-03-11 NOTE — ED Notes (Signed)
Patient has a PEG tube that has become dislodged.

## 2011-03-11 NOTE — ED Provider Notes (Signed)
History     CSN: [id number] Arrival date & time: 03/11/2011  9:05 PM   First MD Initiated Contact with Patient 03/11/11 2148      Chief Complaint  Patient presents with  . GI Problem    (Consider location/radiation/quality/duration/timing/severity/associated sxs/prior treatment) HPI Comments: Level 5 caveat due to quaraplegia and aphasia.  Patient is sent by EMS from nursing facility at [facility]. Patient has a G-tube normally in place. Obviously has fallen out. The patient's tube was not brought with the patient here. No report of significant trauma, bleeding or any change in the patient's baseline physical or mental condition. The patient was likely sent here for a G-tube temporary replacement. Patient does receive all of his usual medications via the tube. Patient's primary care physician listed in his paperwork is Dr. Baltazar Najjar.  Patient is a 55 y.o. male presenting with GI illlness. The history is provided by the EMS personnel.  GI Problem     Past Medical History  Diagnosis Date  . Quadriplegia     Past Surgical History  Procedure Date  . Peg tube placement   . Peripherally inserted central catheter insertion     History reviewed. No pertinent family history.  History  Substance Use Topics  . Smoking status: Not on file  . Smokeless tobacco: Not on file  . Alcohol Use: No      Review of Systems  Unable to perform ROS: Other    Allergies  Aspirin; Nsaids; and Penicillins  Home Medications   Current Outpatient Rx  Name Route Sig Dispense Refill  . BACLOFEN 10 MG PO TABS PEG Tube 10 mg by PEG Tube route 3 (three) times daily.      Marland Kitchen CARBIDOPA-LEVODOPA 25-100 MG PO TABS PEG Tube 1 tablet by PEG Tube route 3 (three) times daily.      Marland Kitchen CETIRIZINE HCL 5 MG PO TABS PEG Tube 5 mg by PEG Tube route daily.      Marland Kitchen ESOMEPRAZOLE MAGNESIUM 20 MG PO PACK PEG Tube 20 mg by PEG Tube route 2 (two) times daily.      Marland Kitchen FERROUS SULFATE PO PEG Tube 7.2 mLs by PEG  Tube route daily.      Marland Kitchen FLUDROCORTISONE ACETATE 0.1 MG PO TABS PEG Tube 0.1 mg by PEG Tube route daily.      Marland Kitchen FOLIC ACID 1 MG PO TABS PEG Tube 1 mg by PEG Tube route daily.      Marland Kitchen HYPROMELLOSE 2.5 % OP SOLN Both Eyes Place 1 drop into both eyes 3 (three) times daily.      . OLOPATADINE HCL 0.1 % OP SOLN Both Eyes Place 1 drop into both eyes daily.      Marland Kitchen PHENYTOIN 100 MG/4ML PO SUSP PEG Tube 150 mg by PEG Tube route 2 (two) times daily.      Marland Kitchen POLYETHYLENE GLYCOL 3350 PO PACK PEG Tube 17 g by PEG Tube route daily.      Marland Kitchen POTASSIUM CHLORIDE 20 MEQ PO PACK PEG Tube 20 mEq by PEG Tube route daily.        BP 112/64  Pulse 77  Temp(Src) 98.8 F (37.1 C) (Oral)  Resp 20  SpO2 96%  Physical Exam  Nursing note and vitals reviewed. Constitutional: He appears well-developed.  HENT:  Head: Normocephalic and atraumatic.  Abdominal:         No guard or rebound  Neurological:       Patient is quadriplegic. He is able  to move his shoulders on both sides somewhat. He is slightly purposeful. He is awake and appears to be in a pleasant mood.  Skin: Skin is warm.    ED Course  Gastrostomy tube replacement Date/Time: 03/11/2011 11:09 PM Performed by: Lear Ng. Authorized by: Lear Ng Consent: Verbal consent not obtained. Written consent not obtained. The procedure was performed in an emergent situation. Relevant documents: relevant documents present and verified Patient identity confirmed: arm band Time out: Immediately prior to procedure a "time out" was called to verify the correct patient, procedure, equipment, support staff and site/side marked as required. Preparation: Patient was prepped and draped in the usual sterile fashion. Patient tolerance: Patient tolerated the procedure well with no immediate complications. Comments: A 14 french foley catheter was used to go through stoma into gastric space, balloon insufflated with 100 cc of sterile water.  KUB ordered to confirm  placement.     (including critical care time)  Labs Reviewed - No data to display No results found.   No diagnosis found.    MDM  Pt with dysphagia, unable to tolerate PO's without G tube.  Will need replacement formally with IR later.  Stoma preserved with foley here.  Can receive meds and liquids temporary fashion for now.  Will get verified placement with KUB and gastrografin.      12:18 AM I reviewed films myself.  Gastrografin is in stomach.    Will d/c back to facility.    Gavin Pound. Oletta Lamas, MD 03/12/11 1610

## 2011-03-12 MED ORDER — IOHEXOL 300 MG/ML  SOLN: 30.0000 mL | Freq: Once | INTRAMUSCULAR | Status: AC | PRN

## 2011-03-12 NOTE — Discharge Instructions (Signed)
 The foley catheter may be used with fluids.  It is only temporary.  A replacement PEG tube must be arranged with interventionalist or surgeon in the near future.

## 2011-03-31 DIAGNOSIS — Z01818 Encounter for other preprocedural examination: Secondary | ICD-10-CM | POA: Diagnosis not present

## 2011-03-31 DIAGNOSIS — Z993 Dependence on wheelchair: Secondary | ICD-10-CM | POA: Diagnosis not present

## 2011-03-31 DIAGNOSIS — R491 Aphonia: Secondary | ICD-10-CM | POA: Diagnosis not present

## 2011-03-31 DIAGNOSIS — Z79899 Other long term (current) drug therapy: Secondary | ICD-10-CM | POA: Diagnosis not present

## 2011-03-31 DIAGNOSIS — Z833 Family history of diabetes mellitus: Secondary | ICD-10-CM | POA: Diagnosis not present

## 2011-03-31 DIAGNOSIS — Z886 Allergy status to analgesic agent status: Secondary | ICD-10-CM | POA: Diagnosis not present

## 2011-03-31 DIAGNOSIS — Z931 Gastrostomy status: Secondary | ICD-10-CM | POA: Diagnosis not present

## 2011-03-31 DIAGNOSIS — R195 Other fecal abnormalities: Secondary | ICD-10-CM | POA: Diagnosis not present

## 2011-03-31 DIAGNOSIS — Z800 Family history of malignant neoplasm of digestive organs: Secondary | ICD-10-CM | POA: Diagnosis not present

## 2011-03-31 DIAGNOSIS — Z86010 Personal history of colonic polyps: Secondary | ICD-10-CM | POA: Diagnosis not present

## 2011-03-31 DIAGNOSIS — G378 Other specified demyelinating diseases of central nervous system: Secondary | ICD-10-CM | POA: Diagnosis not present

## 2011-03-31 DIAGNOSIS — Z880 Allergy status to penicillin: Secondary | ICD-10-CM | POA: Diagnosis not present

## 2011-03-31 DIAGNOSIS — D126 Benign neoplasm of colon, unspecified: Secondary | ICD-10-CM | POA: Diagnosis not present

## 2011-03-31 DIAGNOSIS — R569 Unspecified convulsions: Secondary | ICD-10-CM | POA: Diagnosis not present

## 2011-03-31 DIAGNOSIS — R1310 Dysphagia, unspecified: Secondary | ICD-10-CM | POA: Diagnosis not present

## 2011-03-31 DIAGNOSIS — G839 Paralytic syndrome, unspecified: Secondary | ICD-10-CM | POA: Diagnosis not present

## 2011-03-31 DIAGNOSIS — G8250 Quadriplegia, unspecified: Secondary | ICD-10-CM | POA: Diagnosis not present

## 2011-03-31 DIAGNOSIS — IMO0002 Reserved for concepts with insufficient information to code with codable children: Secondary | ICD-10-CM | POA: Diagnosis not present

## 2011-04-15 DIAGNOSIS — D649 Anemia, unspecified: Secondary | ICD-10-CM | POA: Diagnosis not present

## 2011-04-19 DIAGNOSIS — D509 Iron deficiency anemia, unspecified: Secondary | ICD-10-CM | POA: Diagnosis not present

## 2011-04-19 DIAGNOSIS — E2749 Other adrenocortical insufficiency: Secondary | ICD-10-CM | POA: Diagnosis not present

## 2011-05-11 DIAGNOSIS — D649 Anemia, unspecified: Secondary | ICD-10-CM | POA: Diagnosis not present

## 2011-05-11 DIAGNOSIS — I10 Essential (primary) hypertension: Secondary | ICD-10-CM | POA: Diagnosis not present

## 2011-05-11 DIAGNOSIS — Z79899 Other long term (current) drug therapy: Secondary | ICD-10-CM | POA: Diagnosis not present

## 2011-05-12 DIAGNOSIS — G378 Other specified demyelinating diseases of central nervous system: Secondary | ICD-10-CM | POA: Diagnosis not present

## 2011-05-12 DIAGNOSIS — E2749 Other adrenocortical insufficiency: Secondary | ICD-10-CM | POA: Diagnosis not present

## 2011-05-12 DIAGNOSIS — G20 Parkinson's disease: Secondary | ICD-10-CM | POA: Diagnosis not present

## 2011-05-12 DIAGNOSIS — D509 Iron deficiency anemia, unspecified: Secondary | ICD-10-CM | POA: Diagnosis not present

## 2011-05-12 DIAGNOSIS — K219 Gastro-esophageal reflux disease without esophagitis: Secondary | ICD-10-CM | POA: Diagnosis not present

## 2011-05-12 DIAGNOSIS — R569 Unspecified convulsions: Secondary | ICD-10-CM | POA: Diagnosis not present

## 2011-05-24 DIAGNOSIS — H4320 Crystalline deposits in vitreous body, unspecified eye: Secondary | ICD-10-CM | POA: Diagnosis not present

## 2011-05-24 DIAGNOSIS — H2510 Age-related nuclear cataract, unspecified eye: Secondary | ICD-10-CM | POA: Diagnosis not present

## 2011-05-24 DIAGNOSIS — IMO0002 Reserved for concepts with insufficient information to code with codable children: Secondary | ICD-10-CM | POA: Diagnosis not present

## 2011-05-24 DIAGNOSIS — H1010 Acute atopic conjunctivitis, unspecified eye: Secondary | ICD-10-CM | POA: Diagnosis not present

## 2011-06-30 DIAGNOSIS — G378 Other specified demyelinating diseases of central nervous system: Secondary | ICD-10-CM | POA: Diagnosis not present

## 2011-06-30 DIAGNOSIS — E2749 Other adrenocortical insufficiency: Secondary | ICD-10-CM | POA: Diagnosis not present

## 2011-06-30 DIAGNOSIS — R569 Unspecified convulsions: Secondary | ICD-10-CM | POA: Diagnosis not present

## 2011-06-30 DIAGNOSIS — D509 Iron deficiency anemia, unspecified: Secondary | ICD-10-CM | POA: Diagnosis not present

## 2011-06-30 DIAGNOSIS — K219 Gastro-esophageal reflux disease without esophagitis: Secondary | ICD-10-CM | POA: Diagnosis not present

## 2011-06-30 DIAGNOSIS — G20 Parkinson's disease: Secondary | ICD-10-CM | POA: Diagnosis not present

## 2011-07-01 DIAGNOSIS — J811 Chronic pulmonary edema: Secondary | ICD-10-CM | POA: Diagnosis not present

## 2011-07-01 DIAGNOSIS — R05 Cough: Secondary | ICD-10-CM | POA: Diagnosis not present

## 2011-07-04 DIAGNOSIS — R635 Abnormal weight gain: Secondary | ICD-10-CM | POA: Diagnosis not present

## 2011-07-04 DIAGNOSIS — J209 Acute bronchitis, unspecified: Secondary | ICD-10-CM | POA: Diagnosis not present

## 2011-07-07 DIAGNOSIS — R569 Unspecified convulsions: Secondary | ICD-10-CM | POA: Diagnosis not present

## 2011-07-08 DIAGNOSIS — R569 Unspecified convulsions: Secondary | ICD-10-CM | POA: Diagnosis not present

## 2011-07-09 DIAGNOSIS — Z79899 Other long term (current) drug therapy: Secondary | ICD-10-CM | POA: Diagnosis not present

## 2011-07-12 DIAGNOSIS — Z79899 Other long term (current) drug therapy: Secondary | ICD-10-CM | POA: Diagnosis not present

## 2011-07-19 DIAGNOSIS — G8250 Quadriplegia, unspecified: Secondary | ICD-10-CM | POA: Diagnosis not present

## 2011-07-19 DIAGNOSIS — R269 Unspecified abnormalities of gait and mobility: Secondary | ICD-10-CM | POA: Diagnosis not present

## 2011-07-20 DIAGNOSIS — R269 Unspecified abnormalities of gait and mobility: Secondary | ICD-10-CM | POA: Diagnosis not present

## 2011-07-20 DIAGNOSIS — G8250 Quadriplegia, unspecified: Secondary | ICD-10-CM | POA: Diagnosis not present

## 2011-07-21 DIAGNOSIS — G8250 Quadriplegia, unspecified: Secondary | ICD-10-CM | POA: Diagnosis not present

## 2011-07-21 DIAGNOSIS — R269 Unspecified abnormalities of gait and mobility: Secondary | ICD-10-CM | POA: Diagnosis not present

## 2011-07-21 DIAGNOSIS — R569 Unspecified convulsions: Secondary | ICD-10-CM | POA: Diagnosis not present

## 2011-07-22 DIAGNOSIS — R269 Unspecified abnormalities of gait and mobility: Secondary | ICD-10-CM | POA: Diagnosis not present

## 2011-07-22 DIAGNOSIS — G8250 Quadriplegia, unspecified: Secondary | ICD-10-CM | POA: Diagnosis not present

## 2011-07-25 DIAGNOSIS — G8250 Quadriplegia, unspecified: Secondary | ICD-10-CM | POA: Diagnosis not present

## 2011-07-25 DIAGNOSIS — R269 Unspecified abnormalities of gait and mobility: Secondary | ICD-10-CM | POA: Diagnosis not present

## 2011-07-26 DIAGNOSIS — G8250 Quadriplegia, unspecified: Secondary | ICD-10-CM | POA: Diagnosis not present

## 2011-07-26 DIAGNOSIS — R269 Unspecified abnormalities of gait and mobility: Secondary | ICD-10-CM | POA: Diagnosis not present

## 2011-07-28 DIAGNOSIS — R569 Unspecified convulsions: Secondary | ICD-10-CM | POA: Diagnosis not present

## 2011-07-28 DIAGNOSIS — G8250 Quadriplegia, unspecified: Secondary | ICD-10-CM | POA: Diagnosis not present

## 2011-07-28 DIAGNOSIS — R269 Unspecified abnormalities of gait and mobility: Secondary | ICD-10-CM | POA: Diagnosis not present

## 2011-07-29 DIAGNOSIS — R269 Unspecified abnormalities of gait and mobility: Secondary | ICD-10-CM | POA: Diagnosis not present

## 2011-07-29 DIAGNOSIS — G8250 Quadriplegia, unspecified: Secondary | ICD-10-CM | POA: Diagnosis not present

## 2011-08-02 DIAGNOSIS — G8250 Quadriplegia, unspecified: Secondary | ICD-10-CM | POA: Diagnosis not present

## 2011-08-02 DIAGNOSIS — R269 Unspecified abnormalities of gait and mobility: Secondary | ICD-10-CM | POA: Diagnosis not present

## 2011-08-03 DIAGNOSIS — R269 Unspecified abnormalities of gait and mobility: Secondary | ICD-10-CM | POA: Diagnosis not present

## 2011-08-03 DIAGNOSIS — G8250 Quadriplegia, unspecified: Secondary | ICD-10-CM | POA: Diagnosis not present

## 2011-08-04 DIAGNOSIS — G8250 Quadriplegia, unspecified: Secondary | ICD-10-CM | POA: Diagnosis not present

## 2011-08-04 DIAGNOSIS — R269 Unspecified abnormalities of gait and mobility: Secondary | ICD-10-CM | POA: Diagnosis not present

## 2011-08-05 DIAGNOSIS — R269 Unspecified abnormalities of gait and mobility: Secondary | ICD-10-CM | POA: Diagnosis not present

## 2011-08-05 DIAGNOSIS — G8250 Quadriplegia, unspecified: Secondary | ICD-10-CM | POA: Diagnosis not present

## 2011-08-08 DIAGNOSIS — R269 Unspecified abnormalities of gait and mobility: Secondary | ICD-10-CM | POA: Diagnosis not present

## 2011-08-08 DIAGNOSIS — G8250 Quadriplegia, unspecified: Secondary | ICD-10-CM | POA: Diagnosis not present

## 2011-08-09 DIAGNOSIS — G8250 Quadriplegia, unspecified: Secondary | ICD-10-CM | POA: Diagnosis not present

## 2011-08-09 DIAGNOSIS — R269 Unspecified abnormalities of gait and mobility: Secondary | ICD-10-CM | POA: Diagnosis not present

## 2011-08-10 DIAGNOSIS — R269 Unspecified abnormalities of gait and mobility: Secondary | ICD-10-CM | POA: Diagnosis not present

## 2011-08-10 DIAGNOSIS — G8250 Quadriplegia, unspecified: Secondary | ICD-10-CM | POA: Diagnosis not present

## 2011-08-11 DIAGNOSIS — G8250 Quadriplegia, unspecified: Secondary | ICD-10-CM | POA: Diagnosis not present

## 2011-08-11 DIAGNOSIS — R269 Unspecified abnormalities of gait and mobility: Secondary | ICD-10-CM | POA: Diagnosis not present

## 2011-08-12 DIAGNOSIS — G8250 Quadriplegia, unspecified: Secondary | ICD-10-CM | POA: Diagnosis not present

## 2011-08-12 DIAGNOSIS — R269 Unspecified abnormalities of gait and mobility: Secondary | ICD-10-CM | POA: Diagnosis not present

## 2011-08-15 DIAGNOSIS — G8250 Quadriplegia, unspecified: Secondary | ICD-10-CM | POA: Diagnosis not present

## 2011-08-15 DIAGNOSIS — R269 Unspecified abnormalities of gait and mobility: Secondary | ICD-10-CM | POA: Diagnosis not present

## 2011-08-16 DIAGNOSIS — R269 Unspecified abnormalities of gait and mobility: Secondary | ICD-10-CM | POA: Diagnosis not present

## 2011-08-16 DIAGNOSIS — G8250 Quadriplegia, unspecified: Secondary | ICD-10-CM | POA: Diagnosis not present

## 2011-10-10 ENCOUNTER — Emergency Department (HOSPITAL_COMMUNITY)
Admission: EM | Admit: 2011-10-10 | Discharge: 2011-10-11 | Disposition: A | Discharge status: Skilled Nursing Facility | Payer: Medicare Other | Attending: Emergency Medicine | Admitting: Emergency Medicine

## 2011-10-10 DIAGNOSIS — Z431 Encounter for attention to gastrostomy: Secondary | ICD-10-CM | POA: Insufficient documentation

## 2011-10-10 DIAGNOSIS — G8250 Quadriplegia, unspecified: Secondary | ICD-10-CM | POA: Diagnosis not present

## 2011-10-10 DIAGNOSIS — R141 Gas pain: Secondary | ICD-10-CM | POA: Insufficient documentation

## 2011-10-10 DIAGNOSIS — R109 Unspecified abdominal pain: Secondary | ICD-10-CM | POA: Insufficient documentation

## 2011-10-10 DIAGNOSIS — K9423 Gastrostomy malfunction: Secondary | ICD-10-CM | POA: Diagnosis not present

## 2011-10-10 DIAGNOSIS — R142 Eructation: Secondary | ICD-10-CM | POA: Insufficient documentation

## 2011-10-10 DIAGNOSIS — R633 Feeding difficulties: Secondary | ICD-10-CM | POA: Diagnosis not present

## 2011-10-10 DIAGNOSIS — R143 Flatulence: Secondary | ICD-10-CM | POA: Insufficient documentation

## 2011-10-10 HISTORY — DX: Anemia, unspecified: D64.9

## 2011-10-10 HISTORY — DX: Major depressive disorder, single episode, unspecified: F32.9

## 2011-10-10 HISTORY — DX: Depression, unspecified: F32.A

## 2011-10-10 NOTE — ED Notes (Signed)
AVW:[id number]<XB> Expected date:10/10/11<BR> Expected time:11:32 PM<BR> Means of arrival:Ambulance<BR> Comments:<BR> Peg tube pulled out

## 2011-10-10 NOTE — ED Notes (Addendum)
As per EMS pt was found with feeding tube removed, [employer] called EMS

## 2011-10-11 ENCOUNTER — Encounter (HOSPITAL_COMMUNITY): Payer: Self-pay | Admitting: Emergency Medicine

## 2011-10-11 ENCOUNTER — Emergency Department (HOSPITAL_COMMUNITY): Payer: Medicare Other

## 2011-10-11 DIAGNOSIS — K9423 Gastrostomy malfunction: Secondary | ICD-10-CM | POA: Diagnosis not present

## 2011-10-11 DIAGNOSIS — R109 Unspecified abdominal pain: Secondary | ICD-10-CM | POA: Diagnosis not present

## 2011-10-11 DIAGNOSIS — R633 Feeding difficulties: Secondary | ICD-10-CM | POA: Diagnosis not present

## 2011-10-11 DIAGNOSIS — G8250 Quadriplegia, unspecified: Secondary | ICD-10-CM | POA: Diagnosis not present

## 2011-10-11 DIAGNOSIS — Z431 Encounter for attention to gastrostomy: Secondary | ICD-10-CM | POA: Diagnosis not present

## 2011-10-11 DIAGNOSIS — R141 Gas pain: Secondary | ICD-10-CM | POA: Diagnosis not present

## 2011-10-11 LAB — CBC WITH DIFFERENTIAL/PLATELET
Basophils Absolute: 0 10*3/uL (ref 0.0–0.1)
Basophils Relative: 0 % (ref 0–1)
HCT: 34 % — ABNORMAL LOW (ref 39.0–52.0)
MCHC: 32.9 g/dL (ref 30.0–36.0)
Monocytes Absolute: 0.8 10*3/uL (ref 0.1–1.0)
Neutro Abs: 5 10*3/uL (ref 1.7–7.7)
Neutrophils Relative %: 61 % (ref 43–77)
Platelets: 235 10*3/uL (ref 150–400)
RDW: 13.7 % (ref 11.5–15.5)

## 2011-10-11 LAB — BASIC METABOLIC PANEL
Chloride: 98 mEq/L (ref 96–112)
Creatinine, Ser: 0.54 mg/dL (ref 0.50–1.35)
GFR calc Af Amer: >90 mL/min (ref >90)

## 2011-10-11 LAB — URINALYSIS, ROUTINE W REFLEX MICROSCOPIC
Ketones, ur: NEGATIVE mg/dL
Nitrite: POSITIVE — AB
Protein, ur: NEGATIVE mg/dL
pH: 6 (ref 5.0–8.0)

## 2011-10-11 LAB — URINE MICROSCOPIC-ADD ON

## 2011-10-11 MED ORDER — IOHEXOL 300 MG/ML  SOLN
10.0000 mL | Freq: Once | INTRAMUSCULAR | Status: AC | PRN
  Administered 2011-10-11: 10 mL

## 2011-10-11 MED ORDER — SODIUM CHLORIDE 0.9 % IV SOLN
Freq: Once | INTRAVENOUS | Status: AC
  Administered 2011-10-11: 05:00:00 via INTRAVENOUS

## 2011-10-11 NOTE — ED Notes (Signed)
Pt back from IR.  Peg tube in place.

## 2011-10-11 NOTE — ED Notes (Signed)
[facility] attempted to draw labs but was unable.

## 2011-10-11 NOTE — ED Notes (Signed)
ONG:[id number]<WU> Expected date:<BR> Expected time:<BR> Means of arrival:<BR> Comments:<BR> Pt in IR, Elberta Fortis

## 2011-10-11 NOTE — ED Provider Notes (Signed)
Medical screening examination/treatment/procedure(s) were performed by non-physician practitioner and as supervising physician I was immediately available for consultation/collaboration.  Jasmine Awe, MD 10/11/11 [phone]

## 2011-10-11 NOTE — ED Provider Notes (Signed)
History     CSN: [id number]  Arrival date & time 10/10/11  2332   First MD Initiated Contact with Patient 10/11/11 0124      Chief Complaint  Patient presents with  . Feeding Intolerance    feeding tube dislodged     (Consider location/radiation/quality/duration/timing/severity/associated sxs/prior treatment) HPI  Past Medical History  Diagnosis Date  . Quadriplegia     Past Surgical History  Procedure Date  . Peg tube placement   . Peripherally inserted central catheter insertion     No family history on file.  History  Substance Use Topics  . Smoking status: Not on file  . Smokeless tobacco: Not on file  . Alcohol Use: No      Review of Systems  Allergies  Aspirin; Nsaids; and Penicillins  Home Medications   Current Outpatient Rx  Name Route Sig Dispense Refill  . ACETAMINOPHEN 325 MG PO TABS Per Tube Place 650 mg into feeding tube every 6 (six) hours as needed.      . ACETAMINOPHEN 650 MG RE SUPP Rectal Place 650 mg rectally every 4 (four) hours as needed. For pain     . BACLOFEN 10 MG PO TABS PEG Tube 10 mg by PEG Tube route 3 (three) times daily.      Marland Kitchen BISACODYL 10 MG RE SUPP Rectal Place 10 mg rectally daily as needed. For constipation     . CARBIDOPA-LEVODOPA 25-100 MG PO TABS PEG Tube 1 tablet by PEG Tube route 3 (three) times daily.      Marland Kitchen CETIRIZINE HCL 5 MG PO TABS PEG Tube 5 mg by PEG Tube route daily.      Marland Kitchen ESOMEPRAZOLE MAGNESIUM 20 MG PO PACK PEG Tube 20 mg by PEG Tube route daily.     Marland Kitchen FERROUS SULFATE 220 (44 FE) MG/5ML PO ELIX PEG Tube 330 mg by PEG Tube route daily.    Marland Kitchen FLUDROCORTISONE ACETATE 0.1 MG PO TABS PEG Tube 0.1 mg by PEG Tube route daily.      Marland Kitchen FOLIC ACID 1 MG PO TABS PEG Tube 1 mg by PEG Tube route daily.      . GUAIFENESIN-DM 100-10 MG/5ML PO SYRP Oral Take 10 mLs by mouth every 4 (four) hours as needed. For cough     . OLOPATADINE HCL 0.1 % OP SOLN Both Eyes Place 1 drop into both eyes daily.     Marland Kitchen PHENYTOIN 100 MG/4ML PO  SUSP PEG Tube 150 mg by PEG Tube route 2 (two) times daily.     Marland Kitchen POLYETHYLENE GLYCOL 3350 PO PACK PEG Tube 17 g by PEG Tube route 2 (two) times daily.     Marland Kitchen POLYVINYL ALCOHOL 1.4 % OP SOLN Both Eyes Place 1 drop into both eyes as needed.    Marland Kitchen PREDNISONE 10 MG PO TABS PEG Tube 10 mg by PEG Tube route daily.    . SODIUM PHOSPHATES RE ENEM Rectal Place 1 each rectally daily as needed. For constipation       BP 122/73  Pulse 78  Temp 98.7 F (37.1 C) (Oral)  Resp 18  SpO2 100%  Physical Exam  ED Course  Procedures (including critical care time)  Labs Reviewed  CBC WITH DIFFERENTIAL - Abnormal; Notable for the following:    Hemoglobin 11.2 (*)     HCT 34.0 (*)     MCV 73.9 (*)     MCH 24.3 (*)     All other components within normal limits  URINALYSIS,  ROUTINE W REFLEX MICROSCOPIC - Abnormal; Notable for the following:    APPearance CLOUDY (*)     Hgb urine dipstick MODERATE (*)     Nitrite POSITIVE (*)     Leukocytes, UA SMALL (*)     All other components within normal limits  URINE MICROSCOPIC-ADD ON - Abnormal; Notable for the following:    Bacteria, UA MANY (*)     All other components within normal limits  BASIC METABOLIC PANEL   Dg Abd Acute W/chest  10/11/2011  *RADIOLOGY REPORT*  Clinical Data: Abdominal pain.  Distension.  Gastrostomy tube.  ACUTE ABDOMEN SERIES (ABDOMEN 2 VIEW & CHEST 1 VIEW)  Comparison: Chest 11/20/2010, abdomen 03/11/2011.  Findings: Shallow inspiration.  Normal heart size and pulmonary vascularity.  No focal airspace consolidation in the lungs. Bilateral apical pleural thickening.  No pneumothorax.  No blunting of costophrenic angles.  Suggestion of soft tissue prominence in the base of the neck on the right.  This is similar to the previous study and could represent thyroid mass or prominent lymph node.  Mild gaseous prominence of small and large bowel similar to previous study.  Changes are likely to represent ileus.  Low colonic obstruction is not  excluded.  Changes are similar to the previous study.  Gastrostomy tube is not visualized.  No free intra- abdominal air.  No abnormal air fluid levels.  Degenerative changes in the lumbar spine.  IMPRESSION: Gas filled moderately prominent small and large bowel most consistent with ileus although low colonic obstruction such as sigmoid volvulus is not excluded.  Findings are similar to the previous study.  Original Report Authenticated By: Marlon Pel, M.D.     1. Dislodged gastrostomy tube     6:55 AM Handoff from [facility]. Patient pending PEG from interventional radiology. Once replaced, patient can be discharged back to facility. Discussed with Dr. Nicanor Alcon who agrees. No concern for infection.   Vital signs reviewed and are as follows: Filed Vitals:   10/11/11 0346  BP: 122/73  Pulse: 78  Temp: 98.7 F (37.1 C)  Resp: 18   7:55 AM Exam:  Gen NAD; Heart RRR, nml S1,S2, no m/r/g; Lungs CTAB; Abd soft, NT, no rebound or guarding, feeding tube displaced with small amount of intraabdominal tissue noted around the entrance of the incision; Ext 2+ pedal pulses bilaterally, no edema.  10:32 AM G-tube replaced. Abd examined and is benign. D/c to home.   MDM  G-tube replaced without incident.         [place], [place] 10/11/11 [phone]

## 2011-10-11 NOTE — ED Notes (Signed)
Ptar called for pt trasportation

## 2011-10-11 NOTE — ED Provider Notes (Signed)
History     CSN: [id number]  Arrival date & time 10/10/11  2332   First MD Initiated Contact with Patient 10/11/11 0124      Chief Complaint  Patient presents with  . Feeding Intolerance    feeding tube dislodged     (Consider location/radiation/quality/duration/timing/severity/associated sxs/prior treatment) HPI Level 5 caveat due to quaraplegia and aphasia. Patient is sent by EMS from nursing facility at [facility]. Patient has a G-tube normally in place. Obviously has fallen out. ED RN discussed with nursing at the facility who stated that the tube likely fell out between 8 and 10 PM. The patient's tube was not brought with the patient here. No report of significant trauma, bleeding or any change in the patient's baseline physical or mental condition. The patient was likely sent here for a G-tube temporary replacement. Patient does receive all of his usual medications via the tube. Patient's primary care physician listed in his paperwork is Dr. Baltazar Najjar. Patient is a 56 y.o. male presenting with GI illlness. The history is provided by the EMS personnel.  GI Problem    Past Medical History  Diagnosis Date  . Quadriplegia     Past Surgical History  Procedure Date  . Peg tube placement   . Peripherally inserted central catheter insertion     No family history on file.  History  Substance Use Topics  . Smoking status: Not on file  . Smokeless tobacco: Not on file  . Alcohol Use: No      Review of Systems  Unable to perform ROS: Other    Allergies  Aspirin; Nsaids; and Penicillins  Home Medications   Current Outpatient Rx  Name Route Sig Dispense Refill  . ACETAMINOPHEN 325 MG PO TABS Per Tube Place 650 mg into feeding tube every 6 (six) hours as needed.      . ACETAMINOPHEN 650 MG RE SUPP Rectal Place 650 mg rectally every 4 (four) hours as needed. For pain     . BACLOFEN 10 MG PO TABS PEG Tube 10 mg by PEG Tube route 3 (three) times daily.      Marland Kitchen  BISACODYL 10 MG RE SUPP Rectal Place 10 mg rectally daily as needed. For constipation     . CARBIDOPA-LEVODOPA 25-100 MG PO TABS PEG Tube 1 tablet by PEG Tube route 3 (three) times daily.      Marland Kitchen CETIRIZINE HCL 5 MG PO TABS PEG Tube 5 mg by PEG Tube route daily.      Marland Kitchen ESOMEPRAZOLE MAGNESIUM 20 MG PO PACK PEG Tube 20 mg by PEG Tube route daily.     Marland Kitchen FERROUS SULFATE 220 (44 FE) MG/5ML PO ELIX PEG Tube 330 mg by PEG Tube route daily.    Marland Kitchen FLUDROCORTISONE ACETATE 0.1 MG PO TABS PEG Tube 0.1 mg by PEG Tube route daily.      Marland Kitchen FOLIC ACID 1 MG PO TABS PEG Tube 1 mg by PEG Tube route daily.      . GUAIFENESIN-DM 100-10 MG/5ML PO SYRP Oral Take 10 mLs by mouth every 4 (four) hours as needed. For cough     . OLOPATADINE HCL 0.1 % OP SOLN Both Eyes Place 1 drop into both eyes daily.     Marland Kitchen PHENYTOIN 100 MG/4ML PO SUSP PEG Tube 150 mg by PEG Tube route 2 (two) times daily.     Marland Kitchen POLYETHYLENE GLYCOL 3350 PO PACK PEG Tube 17 g by PEG Tube route 2 (two) times daily.     Marland Kitchen  POLYVINYL ALCOHOL 1.4 % OP SOLN Both Eyes Place 1 drop into both eyes as needed.    Marland Kitchen PREDNISONE 10 MG PO TABS PEG Tube 10 mg by PEG Tube route daily.    . SODIUM PHOSPHATES RE ENEM Rectal Place 1 each rectally daily as needed. For constipation       BP 122/73  Pulse 78  Temp 98.7 F (37.1 C) (Oral)  Resp 18  SpO2 100%  Physical Exam  Nursing note and vitals reviewed. Constitutional: He appears well-developed and well-nourished. No distress.       Pt cooperative, no acute distress  HENT:  Head: Normocephalic and atraumatic.  Eyes:       Normal appearance  Neck: Normal range of motion.  Cardiovascular: Normal rate, regular rhythm and normal heart sounds.   Pulmonary/Chest: Effort normal and breath sounds normal. He exhibits no tenderness.  Abdominal: Soft. Bowel sounds are normal.       Mildly distended abdomen, PEG site with possible mild herniation of abdominal wall at site, tissue granulomatous appearing, mild odor noted    Musculoskeletal: Normal range of motion.  Neurological: He is alert.  Skin: Skin is warm and dry. He is not diaphoretic.  Psychiatric: He has a normal mood and affect.    ED Course  Procedures (including critical care time)  Labs Reviewed  CBC WITH DIFFERENTIAL - Abnormal; Notable for the following:    Hemoglobin 11.2 (*)     HCT 34.0 (*)     MCV 73.9 (*)     MCH 24.3 (*)     All other components within normal limits  BASIC METABOLIC PANEL  URINALYSIS, ROUTINE W REFLEX MICROSCOPIC   Dg Abd Acute W/chest  10/11/2011  *RADIOLOGY REPORT*  Clinical Data: Abdominal pain.  Distension.  Gastrostomy tube.  ACUTE ABDOMEN SERIES (ABDOMEN 2 VIEW & CHEST 1 VIEW)  Comparison: Chest 11/20/2010, abdomen 03/11/2011.  Findings: Shallow inspiration.  Normal heart size and pulmonary vascularity.  No focal airspace consolidation in the lungs. Bilateral apical pleural thickening.  No pneumothorax.  No blunting of costophrenic angles.  Suggestion of soft tissue prominence in the base of the neck on the right.  This is similar to the previous study and could represent thyroid mass or prominent lymph node.  Mild gaseous prominence of small and large bowel similar to previous study.  Changes are likely to represent ileus.  Low colonic obstruction is not excluded.  Changes are similar to the previous study.  Gastrostomy tube is not visualized.  No free intra- abdominal air.  No abnormal air fluid levels.  Degenerative changes in the lumbar spine.  IMPRESSION: Gas filled moderately prominent small and large bowel most consistent with ileus although low colonic obstruction such as sigmoid volvulus is not excluded.  Findings are similar to the previous study.  Original Report Authenticated By: Marlon Pel, M.D.     No diagnosis found.    MDM  Pt seen with Dr. Nicanor Alcon. Question whether the patient's tube may have been out longer than 2 hours as there appears to be mild granulation tissue around the site.  Unable to insert foley for temp access. IV started for hydration. Will have IR attempt placement of PEG. If IR unable to place, pt may need admission for hydration and surgical intervention. Pt signed out to [place], PA-C pending IR procedure.       Grant Fontana, PA-C 10/11/11 [phone]

## 2011-10-12 DIAGNOSIS — Z79899 Other long term (current) drug therapy: Secondary | ICD-10-CM | POA: Diagnosis not present

## 2011-10-12 DIAGNOSIS — Z13220 Encounter for screening for lipoid disorders: Secondary | ICD-10-CM | POA: Diagnosis not present

## 2011-10-12 DIAGNOSIS — R569 Unspecified convulsions: Secondary | ICD-10-CM | POA: Diagnosis not present

## 2011-10-12 DIAGNOSIS — K219 Gastro-esophageal reflux disease without esophagitis: Secondary | ICD-10-CM | POA: Diagnosis not present

## 2011-10-12 DIAGNOSIS — D509 Iron deficiency anemia, unspecified: Secondary | ICD-10-CM | POA: Diagnosis not present

## 2011-10-12 DIAGNOSIS — E2749 Other adrenocortical insufficiency: Secondary | ICD-10-CM | POA: Diagnosis not present

## 2011-10-15 LAB — URINE CULTURE

## 2011-10-16 NOTE — ED Notes (Signed)
+   Urine Chart sent to EDP office for review. 

## 2011-10-17 NOTE — ED Notes (Signed)
Chart returned from EDP office for Keflex 500 mg take 1 tab 4 x daily #28 per Levy Sjogren.

## 2011-10-18 NOTE — ED Notes (Signed)
Copy of labs faxed to [facility]isted living [phone]

## 2011-10-21 NOTE — ED Provider Notes (Signed)
Medical screening examination/treatment/procedure(s) were performed by non-physician practitioner and as supervising physician I was immediately available for consultation/collaboration.  Schyler Counsell K Onedia Vargus-Rasch, MD 10/21/11 2312 

## 2011-10-24 DIAGNOSIS — R05 Cough: Secondary | ICD-10-CM | POA: Diagnosis not present

## 2011-10-24 DIAGNOSIS — R0989 Other specified symptoms and signs involving the circulatory and respiratory systems: Secondary | ICD-10-CM | POA: Diagnosis not present

## 2011-10-25 DIAGNOSIS — T8140XA Infection following a procedure, unspecified, initial encounter: Secondary | ICD-10-CM | POA: Diagnosis not present

## 2011-10-27 DIAGNOSIS — D649 Anemia, unspecified: Secondary | ICD-10-CM | POA: Diagnosis not present

## 2011-10-27 DIAGNOSIS — I10 Essential (primary) hypertension: Secondary | ICD-10-CM | POA: Diagnosis not present

## 2011-10-27 DIAGNOSIS — R569 Unspecified convulsions: Secondary | ICD-10-CM | POA: Diagnosis not present

## 2011-10-27 DIAGNOSIS — E119 Type 2 diabetes mellitus without complications: Secondary | ICD-10-CM | POA: Diagnosis not present

## 2011-10-28 ENCOUNTER — Emergency Department (HOSPITAL_COMMUNITY): Payer: Medicare Other

## 2011-10-28 ENCOUNTER — Encounter (HOSPITAL_COMMUNITY): Payer: Self-pay | Admitting: *Deleted

## 2011-10-28 ENCOUNTER — Emergency Department (HOSPITAL_COMMUNITY)
Admission: EM | Admit: 2011-10-28 | Discharge: 2011-10-28 | Disposition: A | Discharge status: Skilled Nursing Facility | Payer: Medicare Other | Attending: Emergency Medicine | Admitting: Emergency Medicine

## 2011-10-28 DIAGNOSIS — E871 Hypo-osmolality and hyponatremia: Secondary | ICD-10-CM

## 2011-10-28 DIAGNOSIS — N329 Bladder disorder, unspecified: Secondary | ICD-10-CM | POA: Diagnosis not present

## 2011-10-28 DIAGNOSIS — R142 Eructation: Secondary | ICD-10-CM | POA: Insufficient documentation

## 2011-10-28 DIAGNOSIS — G8250 Quadriplegia, unspecified: Secondary | ICD-10-CM | POA: Diagnosis not present

## 2011-10-28 DIAGNOSIS — N200 Calculus of kidney: Secondary | ICD-10-CM | POA: Diagnosis not present

## 2011-10-28 DIAGNOSIS — R141 Gas pain: Secondary | ICD-10-CM | POA: Diagnosis not present

## 2011-10-28 DIAGNOSIS — R4701 Aphasia: Secondary | ICD-10-CM | POA: Insufficient documentation

## 2011-10-28 DIAGNOSIS — G20 Parkinson's disease: Secondary | ICD-10-CM | POA: Insufficient documentation

## 2011-10-28 DIAGNOSIS — R319 Hematuria, unspecified: Secondary | ICD-10-CM | POA: Diagnosis not present

## 2011-10-28 DIAGNOSIS — R0602 Shortness of breath: Secondary | ICD-10-CM | POA: Diagnosis not present

## 2011-10-28 DIAGNOSIS — G20A1 Parkinson's disease without dyskinesia, without mention of fluctuations: Secondary | ICD-10-CM | POA: Insufficient documentation

## 2011-10-28 DIAGNOSIS — F3289 Other specified depressive episodes: Secondary | ICD-10-CM | POA: Insufficient documentation

## 2011-10-28 DIAGNOSIS — J9819 Other pulmonary collapse: Secondary | ICD-10-CM | POA: Diagnosis not present

## 2011-10-28 DIAGNOSIS — M549 Dorsalgia, unspecified: Secondary | ICD-10-CM | POA: Diagnosis not present

## 2011-10-28 DIAGNOSIS — F329 Major depressive disorder, single episode, unspecified: Secondary | ICD-10-CM | POA: Insufficient documentation

## 2011-10-28 DIAGNOSIS — J209 Acute bronchitis, unspecified: Secondary | ICD-10-CM | POA: Diagnosis not present

## 2011-10-28 DIAGNOSIS — Z931 Gastrostomy status: Secondary | ICD-10-CM | POA: Diagnosis not present

## 2011-10-28 DIAGNOSIS — Z79899 Other long term (current) drug therapy: Secondary | ICD-10-CM | POA: Insufficient documentation

## 2011-10-28 DIAGNOSIS — J069 Acute upper respiratory infection, unspecified: Secondary | ICD-10-CM | POA: Diagnosis not present

## 2011-10-28 DIAGNOSIS — R069 Unspecified abnormalities of breathing: Secondary | ICD-10-CM | POA: Diagnosis not present

## 2011-10-28 DIAGNOSIS — N390 Urinary tract infection, site not specified: Secondary | ICD-10-CM | POA: Insufficient documentation

## 2011-10-28 DIAGNOSIS — N210 Calculus in bladder: Secondary | ICD-10-CM | POA: Diagnosis not present

## 2011-10-28 HISTORY — DX: Unspecified adrenocortical insufficiency: E27.40

## 2011-10-28 HISTORY — DX: Unspecified convulsions: R56.9

## 2011-10-28 HISTORY — DX: Parkinson's disease without dyskinesia, without mention of fluctuations: G20.A1

## 2011-10-28 HISTORY — DX: Parkinson's disease: G20

## 2011-10-28 LAB — CBC WITH DIFFERENTIAL/PLATELET
Basophils Relative: 0 % (ref 0–1)
Eosinophils Relative: 1 % (ref 0–5)
Hemoglobin: 10.4 g/dL — ABNORMAL LOW (ref 13.0–17.0)
Lymphs Abs: 1.7 10*3/uL (ref 0.7–4.0)
MCH: 24 pg — ABNORMAL LOW (ref 26.0–34.0)
MCV: 71.7 fL — ABNORMAL LOW (ref 78.0–100.0)
Monocytes Absolute: 1.1 10*3/uL — ABNORMAL HIGH (ref 0.1–1.0)
Neutrophils Relative %: 68 % (ref 43–77)
RBC: 4.34 MIL/uL (ref 4.22–5.81)

## 2011-10-28 LAB — URINALYSIS, ROUTINE W REFLEX MICROSCOPIC
Bilirubin Urine: NEGATIVE
Ketones, ur: NEGATIVE mg/dL
Nitrite: NEGATIVE
Urobilinogen, UA: 0.2 mg/dL (ref 0.0–1.0)

## 2011-10-28 LAB — BASIC METABOLIC PANEL
CO2: 27 mEq/L (ref 19–32)
Calcium: 9 mg/dL (ref 8.4–10.5)
Chloride: 87 mEq/L — ABNORMAL LOW (ref 96–112)
Glucose, Bld: 90 mg/dL (ref 70–99)
Sodium: 125 mEq/L — ABNORMAL LOW (ref 135–145)

## 2011-10-28 MED ORDER — DEXTROSE 5 % IV SOLN
1.0000 g | Freq: Once | INTRAVENOUS | Status: AC
  Administered 2011-10-28: 1 g via INTRAVENOUS
  Filled 2011-10-28: qty 10

## 2011-10-28 MED ORDER — SODIUM CHLORIDE 0.9 % IV BOLUS (SEPSIS)
1000.0000 mL | Freq: Once | INTRAVENOUS | Status: AC
  Administered 2011-10-28: 1000 mL via INTRAVENOUS

## 2011-10-28 MED ORDER — SODIUM CHLORIDE 0.9 % IV BOLUS (SEPSIS)
500.0000 mL | Freq: Once | INTRAVENOUS | Status: AC
  Administered 2011-10-28: 500 mL via INTRAVENOUS

## 2011-10-28 MED ORDER — HYDROMORPHONE HCL PF 1 MG/ML IJ SOLN
1.0000 mg | Freq: Once | INTRAMUSCULAR | Status: AC
  Administered 2011-10-28: 1 mg via INTRAVENOUS
  Filled 2011-10-28: qty 1

## 2011-10-28 MED ORDER — SODIUM CHLORIDE 0.9 % IV BOLUS (SEPSIS): 500.0000 mL | Freq: Once | INTRAVENOUS | Status: DC

## 2011-10-28 MED ORDER — CEPHALEXIN 500 MG PO CAPS: 500.0000 mg | ORAL_CAPSULE | Freq: Four times a day (QID) | ORAL | Status: AC

## 2011-10-28 NOTE — ED Provider Notes (Cosign Needed)
History     CSN: [id number]  Arrival date & time 10/28/11  1411   First MD Initiated Contact with Patient 10/28/11 1432      Chief Complaint  Patient presents with  . URI    (Consider location/radiation/quality/duration/timing/severity/associated sxs/prior treatment) HPI Mr. Wolfson is an African American patient with a past medical history significant for quadriplegia and aphasia secondary to neurological trauma.   HE was sent from the NH for eval of possible resp infection.  Level 5 caveat applies for aphasia.    Past Medical History  Diagnosis Date  . Quadriplegia   . Depression   . Anemia   . Parkinson's disease   . Seizures   . Adrenal insufficiency     Past Surgical History  Procedure Date  . Peg tube placement   . Peripherally inserted central catheter insertion     No family history on file.  History  Substance Use Topics  . Smoking status: Not on file  . Smokeless tobacco: Not on file  . Alcohol Use: No      Review of Systems  Unable to perform ROS   Allergies  Aspirin; Nsaids; and Penicillins  Home Medications   Current Outpatient Rx  Name Route Sig Dispense Refill  . ACETAMINOPHEN 325 MG PO TABS Per Tube Place 650 mg into feeding tube every 6 (six) hours as needed.      . ACETAMINOPHEN 650 MG RE SUPP Rectal Place 650 mg rectally every 4 (four) hours as needed. For pain     . BACLOFEN 10 MG PO TABS PEG Tube 10 mg by PEG Tube route 3 (three) times daily.      Marland Kitchen BISACODYL 10 MG RE SUPP Rectal Place 10 mg rectally daily as needed. For constipation     . CARBIDOPA-LEVODOPA 25-100 MG PO TABS PEG Tube 1 tablet by PEG Tube route 3 (three) times daily.      Marland Kitchen CETIRIZINE HCL 5 MG PO TABS PEG Tube 5 mg by PEG Tube route daily.      Marland Kitchen ESOMEPRAZOLE MAGNESIUM 20 MG PO PACK PEG Tube 20 mg by PEG Tube route daily.     Marland Kitchen FERROUS SULFATE 220 (44 FE) MG/5ML PO ELIX PEG Tube 330 mg by PEG Tube route daily.    Marland Kitchen FLUDROCORTISONE ACETATE 0.1 MG PO TABS PEG Tube 0.1  mg by PEG Tube route daily.      Marland Kitchen FOLIC ACID 1 MG PO TABS PEG Tube 1 mg by PEG Tube route daily.      . OLOPATADINE HCL 0.1 % OP SOLN Both Eyes Place 1 drop into both eyes daily.     Marland Kitchen PHENYTOIN 100 MG/4ML PO SUSP PEG Tube 150 mg by PEG Tube route 2 (two) times daily.     Marland Kitchen POLYETHYLENE GLYCOL 3350 PO PACK PEG Tube 17 g by PEG Tube route 2 (two) times daily.     Marland Kitchen POLYVINYL ALCOHOL 1.4 % OP SOLN Both Eyes Place 1 drop into both eyes as needed.    Marland Kitchen PREDNISONE 10 MG PO TABS PEG Tube 10 mg by PEG Tube route daily.    . SODIUM PHOSPHATES RE ENEM Rectal Place 1 each rectally daily as needed. For constipation       BP 105/65  Pulse 89  Temp 98.3 F (36.8 C) (Oral)  Resp 27  SpO2 97%  Physical Exam  Nursing note and vitals reviewed. Constitutional: He appears well-developed and well-nourished. No distress.       No  distress. He is alert and seems to understand when I speak with him, but he is unable to speak.  He has contractures in his ue so he cannot write.   HENT:  Head: Normocephalic and atraumatic.  Mouth/Throat: Oropharynx is clear and moist.  Eyes: Conjunctivae are normal.  Cardiovascular: Normal rate.   No murmur heard. Pulmonary/Chest: Effort normal. He has rales.  Abdominal: Soft. Bowel sounds are normal.  Musculoskeletal:       Chronic upper ext contractures.   Legs without edema.  Neurological: He is alert.  Skin: Skin is warm and dry.  Psychiatric:       Can't assess because of aphasia    ED Course  Procedures (including critical care time)   Labs Reviewed  URINALYSIS, ROUTINE W REFLEX MICROSCOPIC   No results found.   No diagnosis found.    MDM  Aphasic paraplegic male sent for eval of possible uri.  He is bedridden in no distress. His lungs have rhonchi and rales.  Will do cxr and check ua.  He can be treated in NH if findings are + for infection.          Cheri Guppy, MD 10/28/11 1539

## 2011-10-28 NOTE — Discharge Instructions (Signed)
He should has 8 cm stone in bladder. He is being treated with Rocephin for urinary tract infection. His urine is being cultured. His sodium level was 125. He received a liter of normal saline. I have spoken with the nursing home and would advise a recheck of his sodium level.

## 2011-10-28 NOTE — ED Provider Notes (Cosign Needed Addendum)
History     CSN: [id number]  Arrival date & time 10/28/11  1411   First MD Initiated Contact with Patient 10/28/11 1432      Chief Complaint  Patient presents with  . URI   Level 5, patient aphasic (Consider location/radiation/quality/duration/timing/severity/associated sxs/prior treatment) Patient is a 56 y.o. male presenting with URI.  URI   mother states patient has been complaining of back pain appears to upper respiratory infection symptoms. He is in a nursing home at [facility]. It is unclear how long the symptoms have been present for the severity. Mother is unclear on the length of time the back pain has been present. Nursing home personnel state they thought the patient had an upper respiratory infection and we're going to get a chest x-Syrenity Klepacki but mother wanted patient brought to the emergency department.Patient complaining of right low back pain per mother with radiation to right flank. Past Medical History  Diagnosis Date  . Quadriplegia   . Depression   . Anemia   . Parkinson's disease   . Seizures   . Adrenal insufficiency     Past Surgical History  Procedure Date  . Peg tube placement   . Peripherally inserted central catheter insertion     No family history on file.  History  Substance Use Topics  . Smoking status: Not on file  . Smokeless tobacco: Not on file  . Alcohol Use: No      Review of Systems  Unable to perform ROS   Allergies  Aspirin; Nsaids; and Penicillins  Home Medications   Current Outpatient Rx  Name Route Sig Dispense Refill  . ACETAMINOPHEN 325 MG PO TABS Per Tube Place 650 mg into feeding tube every 6 (six) hours as needed.      . ACETAMINOPHEN 650 MG RE SUPP Rectal Place 650 mg rectally every 4 (four) hours as needed. For pain    . BACLOFEN 10 MG PO TABS PEG Tube 10 mg by PEG Tube route 3 (three) times daily.      Marland Kitchen CARBIDOPA-LEVODOPA 25-100 MG PO TABS PEG Tube 1 tablet by PEG Tube route 3 (three) times daily.      Marland Kitchen  CETIRIZINE HCL 5 MG PO TABS PEG Tube 5 mg by PEG Tube route daily.      Marland Kitchen ESOMEPRAZOLE MAGNESIUM 20 MG PO PACK PEG Tube 20 mg by PEG Tube route daily.     Marland Kitchen FERROUS SULFATE 220 (44 FE) MG/5ML PO ELIX PEG Tube 330 mg by PEG Tube route daily.    Marland Kitchen FLUDROCORTISONE ACETATE 0.1 MG PO TABS PEG Tube 0.1 mg by PEG Tube route every evening.     Marland Kitchen FOLIC ACID 1 MG PO TABS PEG Tube 1 mg by PEG Tube route daily.      . GUAIFENESIN 50 MG PO PACK Tube Give 2 each by tube every 4 (four) hours.    . IPRATROPIUM-ALBUTEROL 0.5-2.5 (3) MG/3ML IN SOLN Nebulization Take 3 mLs by nebulization every 6 (six) hours as needed. For shortness of breath    . MOXIFLOXACIN HCL 400 MG PO TABS Tube Give 400 mg by tube every evening.    Harriet Pho HN PO LIQD Oral Take 237 mLs by mouth continuous. Run@@50cc /hr for 20 hrs. Turn pump on in an one hour after dilantin dose    . OLOPATADINE HCL 0.1 % OP SOLN Both Eyes Place 1 drop into both eyes daily.     Marland Kitchen PHENYTOIN 100 MG/4ML PO SUSP PEG Tube 150 mg  by PEG Tube route 2 (two) times daily.     Marland Kitchen POLYETHYLENE GLYCOL 3350 PO PACK PEG Tube 17 g by PEG Tube route 2 (two) times daily.     Marland Kitchen POLYVINYL ALCOHOL 1.4 % OP SOLN Both Eyes Place 1 drop into both eyes daily.     Marland Kitchen PREDNISONE 10 MG PO TABS PEG Tube 10 mg by PEG Tube route daily.    Marland Kitchen SACCHAROMYCES BOULARDII 250 MG PO CAPS Tube Give 250 mg by tube 2 (two) times daily.    Marland Kitchen BISACODYL 10 MG RE SUPP Rectal Place 10 mg rectally daily as needed. For constipation       BP 103/63  Pulse 88  Temp 97.5 F (36.4 C) (Oral)  Resp 20  SpO2 92%  Physical Exam  Nursing note and vitals reviewed. HENT:  Head: Atraumatic.  Mouth/Throat: Oropharynx is clear and moist.  Eyes: Pupils are equal, round, and reactive to light.  Neck: Normal range of motion. Neck supple.  Cardiovascular: Normal rate.   Pulmonary/Chest: Effort normal and breath sounds normal.  Abdominal: Soft. Bowel sounds are normal.       PEG tube in place  Musculoskeletal:        All 4 extremities are contractured c  Neurological: He is alert.       Patient is unable to move extremities he does point to yes and no and is alert.  Skin: Skin is warm and dry.    ED Course  Procedures (including critical care time)  Labs Reviewed  URINALYSIS, ROUTINE W REFLEX MICROSCOPIC - Abnormal; Notable for the following:    APPearance TURBID (*)     Hgb urine dipstick LARGE (*)     Leukocytes, UA LARGE (*)     All other components within normal limits  URINE MICROSCOPIC-ADD ON - Abnormal; Notable for the following:    Bacteria, UA MANY (*)     All other components within normal limits  CBC WITH DIFFERENTIAL - Abnormal; Notable for the following:    Hemoglobin 10.4 (*)     HCT 31.1 (*)     MCV 71.7 (*)     MCH 24.0 (*)     Monocytes Absolute 1.1 (*)     All other components within normal limits  BASIC METABOLIC PANEL - Abnormal; Notable for the following:    Sodium 125 (*)     Chloride 87 (*)     Creatinine, Ser 0.47 (*)     All other components within normal limits   Ct Abdomen Pelvis Wo Contrast  10/28/2011  *RADIOLOGY REPORT*  Clinical Data:  back pain, uti, abdominal distension  CT ABDOMEN AND PELVIS WITHOUT CONTRAST  Technique:  Multidetector CT imaging of the abdomen and pelvis was performed following the standard protocol without intravenous contrast.  Comparison: 11/20/2010  Findings: Dependent changes are noted within both lung bases.  No pericardial or pleural effusion.  No focal liver abnormalities.  The gallbladder appears normal. Normal appearance of the spleen.  The adrenal glands are normal.  Punctate stone is noted within the lower pole collecting system of the right kidney.  No right-sided obstructive uropathy.  Within the urinary bladder in the region of the right UVJ there is a stone which measures 0.8 x 0.6 cm, image 77.  Cyst within the upper pole of the left kidney is incompletely characterized without IV contrast.  No left-sided hydronephrosis or  hydroureter.  No upper abdominal adenopathy.  There is no pelvic or inguinal adenopathy.  Gastrostomy tube is noted.  The stomach is otherwise unremarkable.  The small bowel loops have a normal caliber.  Normal appearance of the proximal colon.  Moderate retained stool is identified within the rectum.  Review of the visualized bony structures is significant for osteopenia.  Mild degenerative disc disease is present.  Mild endplate compression deformity is noted at the T11 and T12 levels.  IMPRESSION:  1.  Nonobstructing right renal calculus. 2.  8 mm stone is noted within the urinary bladder near the right UVJ.  This is not result in any significant hydronephrosis or hydroureter.  Original Report Authenticated By: Rosealee Albee, M.D.   Dg Chest 2 View  10/28/2011  *RADIOLOGY REPORT*  Clinical Data: Upper respiratory tract infection  CHEST - 2 VIEW  Comparison: 11/20/2010  Findings: Heart size is normal.  No pleural effusion or edema.  Lung volumes are low and there is atelectasis in the left base.  Coarsened interstitial markings are noted bilaterally.  No consolidation noted.  IMPRESSION:  1.  Low lung volumes and left base atelectasis. 2.  Chronic interstitial coarsening.  Original Report Authenticated By: Rosealee Albee, M.D.     No diagnosis found.   Results for orders placed during the hospital encounter of 10/28/11  URINALYSIS, ROUTINE W REFLEX MICROSCOPIC      Component Value Range   Color, Urine YELLOW  YELLOW   APPearance TURBID (*) CLEAR   Specific Gravity, Urine 1.019  1.005 - 1.030   pH 7.0  5.0 - 8.0   Glucose, UA NEGATIVE  NEGATIVE mg/dL   Hgb urine dipstick LARGE (*) NEGATIVE   Bilirubin Urine NEGATIVE  NEGATIVE   Ketones, ur NEGATIVE  NEGATIVE mg/dL   Protein, ur NEGATIVE  NEGATIVE mg/dL   Urobilinogen, UA 0.2  0.0 - 1.0 mg/dL   Nitrite NEGATIVE  NEGATIVE   Leukocytes, UA LARGE (*) NEGATIVE  URINE MICROSCOPIC-ADD ON      Component Value Range   Squamous Epithelial / LPF  RARE  RARE   WBC, UA 11-20  <3 WBC/hpf   RBC / HPF 3-6  <3 RBC/hpf   Bacteria, UA MANY (*) RARE  CBC WITH DIFFERENTIAL      Component Value Range   WBC 8.8  4.0 - 10.5 K/uL   RBC 4.34  4.22 - 5.81 MIL/uL   Hemoglobin 10.4 (*) 13.0 - 17.0 g/dL   HCT 16.1 (*) 09.6 - 04.5 %   MCV 71.7 (*) 78.0 - 100.0 fL   MCH 24.0 (*) 26.0 - 34.0 pg   MCHC 33.4  30.0 - 36.0 g/dL   RDW 40.9  81.1 - 91.4 %   Platelets 256  150 - 400 K/uL   Neutrophils Relative 68  43 - 77 %   Lymphocytes Relative 19  12 - 46 %   Monocytes Relative 12  3 - 12 %   Eosinophils Relative 1  0 - 5 %   Basophils Relative 0  0 - 1 %   Neutro Abs 5.9  1.7 - 7.7 K/uL   Lymphs Abs 1.7  0.7 - 4.0 K/uL   Monocytes Absolute 1.1 (*) 0.1 - 1.0 K/uL   Eosinophils Absolute 0.1  0.0 - 0.7 K/uL   Basophils Absolute 0.0  0.0 - 0.1 K/uL   Smear Review MORPHOLOGY UNREMARKABLE    BASIC METABOLIC PANEL      Component Value Range   Sodium 125 (*) 135 - 145 mEq/L   Potassium 4.1  3.5 - 5.1  mEq/L   Chloride 87 (*) 96 - 112 mEq/L   CO2 27  19 - 32 mEq/L   Glucose, Bld 90  70 - 99 mg/dL   BUN 11  6 - 23 mg/dL   Creatinine, Ser 6.57 (*) 0.50 - 1.35 mg/dL   Calcium 9.0  8.4 - 84.6 mg/dL   GFR calc non Af Amer >90  >90 mL/min   GFR calc Af Amer >90  >90 mL/min    MDM  Patient with hyponatremia and uti and kidney stone in bladder.  Discussed with Jasmine December, RN.   Patient received iv fluids here and advised recheck sodium at nursing home.  Urine is cultured,rocephin given, and keflex for uti treatment po until culture completed.  Discussed with patient and mother and she is in understanding. I think that the back pain has probably been related to the recently passed kidney stone seen on the CT scan. Discussed the hyponatremia a nursing home and they will pass for further sodium levels to be drawn. He was given IV normal saline here.   Hilario Quarry, MD 10/28/11 9629  Hilario Quarry, MD 11/07/11 1554  Patient with flank pain and hematuria-  ct done for same revealed recently passed stone in bladder.   Hilario Quarry, MD 11/15/11 1036

## 2011-10-28 NOTE — ED Notes (Addendum)
Pt in by ems from [facility]. Family reports URI x1 wk. Treated with abx at [facility]. Family requests add'l eval due to continued symptoms. 1st dose Rocephin given at 1250 today at [facility].

## 2011-10-28 NOTE — ED Notes (Signed)
RN Rolly Salter notified of respirations 27

## 2011-10-28 NOTE — ED Notes (Signed)
Discharge instructions reviewed with family. PTAR notified of need for transport.  Report given to Jasmine December at [facility]

## 2011-10-28 NOTE — ED Notes (Signed)
PTAR at bedside 

## 2011-10-28 NOTE — ED Notes (Signed)
Pt's mother at bedside. Reports concern of pna. Nasal congestion noted. Also reports pt has been c/o today of low back pain, worse with movement. Concerned for UTI.

## 2011-10-28 NOTE — ED Notes (Signed)
[id number]> Expected date:<BR> Expected time:<BR> Means of arrival:<BR> Comments:<BR> EMS 56 yo- URI

## 2011-10-28 NOTE — ED Notes (Signed)
Patient transported to X-ray 

## 2011-10-28 NOTE — ED Notes (Signed)
IV team returned call. Informed RN that she is backed up and may not be able to make it to dept before shift change.

## 2011-11-01 DIAGNOSIS — R109 Unspecified abdominal pain: Secondary | ICD-10-CM | POA: Diagnosis not present

## 2011-11-06 DIAGNOSIS — R6889 Other general symptoms and signs: Secondary | ICD-10-CM | POA: Diagnosis not present

## 2011-11-07 DIAGNOSIS — E871 Hypo-osmolality and hyponatremia: Secondary | ICD-10-CM | POA: Diagnosis not present

## 2011-11-07 DIAGNOSIS — K560 Paralytic ileus: Secondary | ICD-10-CM | POA: Diagnosis not present

## 2011-11-07 DIAGNOSIS — D509 Iron deficiency anemia, unspecified: Secondary | ICD-10-CM | POA: Diagnosis not present

## 2011-11-08 ENCOUNTER — Emergency Department (HOSPITAL_COMMUNITY): Payer: Medicare Other

## 2011-11-08 ENCOUNTER — Encounter (HOSPITAL_COMMUNITY): Payer: Self-pay | Admitting: *Deleted

## 2011-11-08 ENCOUNTER — Inpatient Hospital Stay (HOSPITAL_COMMUNITY): Payer: Medicare Other

## 2011-11-08 ENCOUNTER — Inpatient Hospital Stay (HOSPITAL_COMMUNITY)
Admission: EM | Admit: 2011-11-08 | Discharge: 2011-11-11 | DRG: 388 | Disposition: A | Discharge status: Skilled Nursing Facility | Payer: Medicare Other | Attending: Family Medicine | Admitting: Family Medicine

## 2011-11-08 DIAGNOSIS — G20A1 Parkinson's disease without dyskinesia, without mention of fluctuations: Secondary | ICD-10-CM | POA: Diagnosis not present

## 2011-11-08 DIAGNOSIS — R112 Nausea with vomiting, unspecified: Secondary | ICD-10-CM

## 2011-11-08 DIAGNOSIS — Z931 Gastrostomy status: Secondary | ICD-10-CM

## 2011-11-08 DIAGNOSIS — E876 Hypokalemia: Secondary | ICD-10-CM | POA: Diagnosis not present

## 2011-11-08 DIAGNOSIS — T380X5A Adverse effect of glucocorticoids and synthetic analogues, initial encounter: Secondary | ICD-10-CM | POA: Diagnosis present

## 2011-11-08 DIAGNOSIS — E2749 Other adrenocortical insufficiency: Secondary | ICD-10-CM | POA: Diagnosis not present

## 2011-11-08 DIAGNOSIS — R55 Syncope and collapse: Secondary | ICD-10-CM

## 2011-11-08 DIAGNOSIS — D649 Anemia, unspecified: Secondary | ICD-10-CM | POA: Diagnosis not present

## 2011-11-08 DIAGNOSIS — E2740 Unspecified adrenocortical insufficiency: Secondary | ICD-10-CM

## 2011-11-08 DIAGNOSIS — D72829 Elevated white blood cell count, unspecified: Secondary | ICD-10-CM | POA: Diagnosis present

## 2011-11-08 DIAGNOSIS — F3289 Other specified depressive episodes: Secondary | ICD-10-CM | POA: Diagnosis present

## 2011-11-08 DIAGNOSIS — Z79899 Other long term (current) drug therapy: Secondary | ICD-10-CM

## 2011-11-08 DIAGNOSIS — G20 Parkinson's disease: Secondary | ICD-10-CM | POA: Diagnosis not present

## 2011-11-08 DIAGNOSIS — I959 Hypotension, unspecified: Secondary | ICD-10-CM | POA: Diagnosis not present

## 2011-11-08 DIAGNOSIS — K567 Ileus, unspecified: Secondary | ICD-10-CM | POA: Diagnosis present

## 2011-11-08 DIAGNOSIS — R4701 Aphasia: Secondary | ICD-10-CM | POA: Diagnosis present

## 2011-11-08 DIAGNOSIS — K3184 Gastroparesis: Secondary | ICD-10-CM | POA: Diagnosis present

## 2011-11-08 DIAGNOSIS — F329 Major depressive disorder, single episode, unspecified: Secondary | ICD-10-CM | POA: Diagnosis not present

## 2011-11-08 DIAGNOSIS — G40909 Epilepsy, unspecified, not intractable, without status epilepticus: Secondary | ICD-10-CM | POA: Diagnosis not present

## 2011-11-08 DIAGNOSIS — IMO0002 Reserved for concepts with insufficient information to code with codable children: Secondary | ICD-10-CM

## 2011-11-08 DIAGNOSIS — E1143 Type 2 diabetes mellitus with diabetic autonomic (poly)neuropathy: Secondary | ICD-10-CM | POA: Diagnosis present

## 2011-11-08 DIAGNOSIS — K560 Paralytic ileus: Principal | ICD-10-CM

## 2011-11-08 DIAGNOSIS — E871 Hypo-osmolality and hyponatremia: Secondary | ICD-10-CM | POA: Diagnosis not present

## 2011-11-08 DIAGNOSIS — G8250 Quadriplegia, unspecified: Secondary | ICD-10-CM | POA: Diagnosis not present

## 2011-11-08 DIAGNOSIS — R404 Transient alteration of awareness: Secondary | ICD-10-CM | POA: Diagnosis not present

## 2011-11-08 DIAGNOSIS — N281 Cyst of kidney, acquired: Secondary | ICD-10-CM | POA: Diagnosis not present

## 2011-11-08 DIAGNOSIS — R141 Gas pain: Secondary | ICD-10-CM | POA: Diagnosis not present

## 2011-11-08 DIAGNOSIS — R143 Flatulence: Secondary | ICD-10-CM | POA: Diagnosis not present

## 2011-11-08 DIAGNOSIS — N210 Calculus in bladder: Secondary | ICD-10-CM | POA: Diagnosis not present

## 2011-11-08 DIAGNOSIS — I498 Other specified cardiac arrhythmias: Secondary | ICD-10-CM | POA: Diagnosis not present

## 2011-11-08 DIAGNOSIS — R569 Unspecified convulsions: Secondary | ICD-10-CM | POA: Diagnosis present

## 2011-11-08 HISTORY — DX: Unspecified adrenocortical insufficiency: E27.40

## 2011-11-08 HISTORY — DX: Ileus, unspecified: K56.7

## 2011-11-08 HISTORY — DX: Type 2 diabetes mellitus with diabetic autonomic (poly)neuropathy: E11.43

## 2011-11-08 LAB — URINALYSIS, ROUTINE W REFLEX MICROSCOPIC
Bilirubin Urine: NEGATIVE
Nitrite: NEGATIVE
Specific Gravity, Urine: 1.014 (ref 1.005–1.030)
pH: 6 (ref 5.0–8.0)

## 2011-11-08 LAB — COMPREHENSIVE METABOLIC PANEL
ALT: 8 U/L (ref 0–53)
Alkaline Phosphatase: 162 U/L — ABNORMAL HIGH (ref 39–117)
CO2: 19 mEq/L (ref 19–32)
Calcium: 8.6 mg/dL (ref 8.4–10.5)
GFR calc Af Amer: >90 mL/min (ref >90)
GFR calc non Af Amer: >90 mL/min (ref >90)
Glucose, Bld: 85 mg/dL (ref 70–99)
Sodium: 118 mEq/L — CL (ref 135–145)

## 2011-11-08 LAB — BASIC METABOLIC PANEL
BUN: 11 mg/dL (ref 6–23)
CO2: 23 mEq/L (ref 19–32)
Chloride: 88 mEq/L — ABNORMAL LOW (ref 96–112)
Chloride: 93 mEq/L — ABNORMAL LOW (ref 96–112)
GFR calc Af Amer: >90 mL/min (ref >90)
Potassium: 3.7 mEq/L (ref 3.5–5.1)
Sodium: 126 mEq/L — ABNORMAL LOW (ref 135–145)

## 2011-11-08 LAB — CBC WITH DIFFERENTIAL/PLATELET
Basophils Absolute: 0 10*3/uL (ref 0.0–0.1)
Lymphs Abs: 1 10*3/uL (ref 0.7–4.0)
MCH: 24 pg — ABNORMAL LOW (ref 26.0–34.0)
MCHC: 33.2 g/dL (ref 30.0–36.0)
MCV: 72.2 fL — ABNORMAL LOW (ref 78.0–100.0)
Monocytes Absolute: 1.5 10*3/uL — ABNORMAL HIGH (ref 0.1–1.0)
Platelets: 310 10*3/uL (ref 150–400)
RDW: 13.4 % (ref 11.5–15.5)

## 2011-11-08 LAB — URINE MICROSCOPIC-ADD ON

## 2011-11-08 MED ORDER — HYDROCORTISONE SOD SUCCINATE 100 MG IJ SOLR
50.0000 mg | Freq: Three times a day (TID) | INTRAMUSCULAR | Status: DC
  Administered 2011-11-08 – 2011-11-11 (×8): 50 mg via INTRAVENOUS
  Filled 2011-11-08: qty 1
  Filled 2011-11-08 (×2): qty 2
  Filled 2011-11-08 (×9): qty 1

## 2011-11-08 MED ORDER — MORPHINE SULFATE 2 MG/ML IJ SOLN
0.5000 mg | INTRAMUSCULAR | Status: DC | PRN
  Administered 2011-11-08 – 2011-11-09 (×4): 1 mg via INTRAVENOUS
  Filled 2011-11-08 (×4): qty 1

## 2011-11-08 MED ORDER — SODIUM CHLORIDE 0.9 % IV SOLN: INTRAVENOUS | Status: DC

## 2011-11-08 MED ORDER — ONDANSETRON HCL 4 MG PO TABS: 4.0000 mg | ORAL_TABLET | Freq: Four times a day (QID) | ORAL | Status: DC | PRN

## 2011-11-08 MED ORDER — IPRATROPIUM-ALBUTEROL 0.5-2.5 (3) MG/3ML IN SOLN
3.0000 mL | Freq: Four times a day (QID) | RESPIRATORY_TRACT | Status: DC | PRN
Start: 2011-11-08 — End: 2011-11-08

## 2011-11-08 MED ORDER — DEXTROSE-NACL 5-0.9 % IV SOLN
INTRAVENOUS | Status: DC
  Administered 2011-11-08: 18:00:00 via INTRAVENOUS

## 2011-11-08 MED ORDER — OLOPATADINE HCL 0.1 % OP SOLN
1.0000 [drp] | Freq: Every day | OPHTHALMIC | Status: DC
  Administered 2011-11-09: 1 [drp] via OPHTHALMIC
  Filled 2011-11-08: qty 5

## 2011-11-08 MED ORDER — ENOXAPARIN SODIUM 30 MG/0.3ML ~~LOC~~ SOLN
30.0000 mg | SUBCUTANEOUS | Status: DC
  Administered 2011-11-08: 30 mg via SUBCUTANEOUS
  Filled 2011-11-08 (×2): qty 0.3

## 2011-11-08 MED ORDER — ACETAMINOPHEN 325 MG PO TABS: 650.0000 mg | ORAL_TABLET | Freq: Four times a day (QID) | ORAL | Status: DC | PRN

## 2011-11-08 MED ORDER — IPRATROPIUM BROMIDE 0.02 % IN SOLN
0.5000 mg | Freq: Four times a day (QID) | RESPIRATORY_TRACT | Status: DC | PRN
Start: 2011-11-08 — End: 2011-11-11

## 2011-11-08 MED ORDER — PHENYTOIN SODIUM 50 MG/ML IJ SOLN
100.0000 mg | Freq: Three times a day (TID) | INTRAMUSCULAR | Status: DC
  Administered 2011-11-08 – 2011-11-11 (×8): 100 mg via INTRAVENOUS
  Filled 2011-11-08 (×12): qty 2

## 2011-11-08 MED ORDER — ACETAMINOPHEN 650 MG RE SUPP: 650.0000 mg | Freq: Four times a day (QID) | RECTAL | Status: DC | PRN

## 2011-11-08 MED ORDER — POLYVINYL ALCOHOL 1.4 % OP SOLN
1.0000 [drp] | Freq: Every day | OPHTHALMIC | Status: DC
  Administered 2011-11-09 – 2011-11-11 (×3): 1 [drp] via OPHTHALMIC
  Filled 2011-11-08: qty 15

## 2011-11-08 MED ORDER — ONDANSETRON HCL 4 MG/2ML IJ SOLN: 4.0000 mg | Freq: Four times a day (QID) | INTRAMUSCULAR | Status: DC | PRN

## 2011-11-08 MED ORDER — ALBUTEROL SULFATE (5 MG/ML) 0.5% IN NEBU: 2.5000 mg | INHALATION_SOLUTION | Freq: Four times a day (QID) | RESPIRATORY_TRACT | Status: DC | PRN

## 2011-11-08 MED ORDER — PANTOPRAZOLE SODIUM 40 MG IV SOLR
40.0000 mg | Freq: Two times a day (BID) | INTRAVENOUS | Status: DC
  Administered 2011-11-08 – 2011-11-11 (×6): 40 mg via INTRAVENOUS
  Filled 2011-11-08 (×7): qty 40

## 2011-11-08 MED ORDER — CARBIDOPA-LEVODOPA 25-100 MG PO TABS
1.0000 | ORAL_TABLET | Freq: Three times a day (TID) | ORAL | Status: DC
  Administered 2011-11-10 – 2011-11-11 (×4): 1
  Filled 2011-11-08 (×10): qty 1

## 2011-11-08 NOTE — ED Provider Notes (Signed)
History     CSN: [id number]  Arrival date & time 11/08/11  1314   First MD Initiated Contact with Patient 11/08/11 1420      Chief Complaint  Patient presents with  . Back Pain  . Near Syncope   Level V caveat patient noncommunicative (Consider location/radiation/quality/duration/timing/severity/associated sxs/prior treatment) HPI History is obtained from Ms Malen Gauze, nurse at [facility] and from patient's mother patient had a near syncopal event today lasting approximately 40 seconds. Patient did not become completely unresponsive, vent 12:40 AM today. Patient complains of chronic back pain per his mother. Patient had CT scan of abdomen pelvis on 10/28/2011 which showed a nonobstructing right renal stone. No treatment prior to coming here. Patient's was to start tube feedings through his gastrostomy tube today. He's been receiving fluid subcutaneously while at at the nursing home Past Medical History  Diagnosis Date  . Quadriplegia   . Depression   . Anemia   . Parkinson's disease   . Seizures   . Adrenal insufficiency     Past Surgical History  Procedure Date  . Peg tube placement   . Peripherally inserted central catheter insertion     History reviewed. No pertinent family history.  History  Substance Use Topics  . Smoking status: Not on file  . Smokeless tobacco: Not on file  . Alcohol Use: No      Review of Systems  Unable to perform ROS: Mental status change    Allergies  Aspirin; Nsaids; and Penicillins  Home Medications   Current Outpatient Rx  Name Route Sig Dispense Refill  . ACETAMINOPHEN 325 MG PO TABS Per Tube Place 650 mg into feeding tube daily.     . ACETAMINOPHEN 650 MG RE SUPP Rectal Place 650 mg rectally every 4 (four) hours as needed. For pain    . BACLOFEN 10 MG PO TABS PEG Tube 10 mg by PEG Tube route 3 (three) times daily.     Marland Kitchen BISACODYL 10 MG RE SUPP Rectal Place 10 mg rectally daily as needed. For constipation      . CARBIDOPA-LEVODOPA 25-100 MG PO TABS PEG Tube 1 tablet by PEG Tube route 3 (three) times daily.     Marland Kitchen CETIRIZINE HCL 5 MG PO TABS PEG Tube 5 mg by PEG Tube route daily.     Marland Kitchen ESOMEPRAZOLE MAGNESIUM 20 MG PO PACK PEG Tube 20 mg by PEG Tube route daily.     Marland Kitchen FERROUS SULFATE 220 (44 FE) MG/5ML PO ELIX PEG Tube 330 mg by PEG Tube route daily.    Marland Kitchen FLUDROCORTISONE ACETATE 0.1 MG PO TABS PEG Tube 0.1 mg by PEG Tube route every evening.     Marland Kitchen FOLIC ACID 1 MG PO TABS PEG Tube 1 mg by PEG Tube route daily.     . GUAIFENESIN 100 MG/5ML PO LIQD Per Tube Place 200 mg into feeding tube 4 (four) times daily as needed. For cough    . HYDROCODONE-ACETAMINOPHEN 5-325 MG PO TABS Oral Take 1 tablet by mouth every 4 (four) hours as needed. For pain    . IPRATROPIUM-ALBUTEROL 0.5-2.5 (3) MG/3ML IN SOLN Nebulization Take 3 mLs by nebulization every 6 (six) hours as needed. For shortness of breath    . TWOCAL HN PO LIQD Oral Take 237 mLs by mouth continuous. Run@@50cc /hr for 20 hrs. Turn pump on in an one hour after dilantin dose    . OLOPATADINE HCL 0.1 % OP SOLN Both Eyes Place 1 drop into both  eyes daily.     Marland Kitchen PHENYTOIN 100 MG/4ML PO SUSP PEG Tube 150 mg by PEG Tube route 2 (two) times daily.     Marland Kitchen POLYETHYLENE GLYCOL 3350 PO PACK PEG Tube 17 g by PEG Tube route 2 (two) times daily.     Marland Kitchen POLYVINYL ALCOHOL 1.4 % OP SOLN Both Eyes Place 1 drop into both eyes daily.     Marland Kitchen PREDNISONE 10 MG PO TABS PEG Tube 10 mg by PEG Tube route daily.    . TRAMADOL HCL 50 MG PO TABS Feeding Tube 50 mg by Feeding Tube route every 6 (six) hours as needed. For pain    . CEPHALEXIN 500 MG PO CAPS Oral Take 1 capsule (500 mg total) by mouth 4 (four) times daily. 28 capsule 0    BP 98/56  Pulse 69  Temp 99.2 F (37.3 C) (Rectal)  Resp 21  SpO2 99%  Physical Exam  Nursing note and vitals reviewed. Constitutional:       Chronically ill-appearing  HENT:  Head: Normocephalic and atraumatic.  Eyes: Conjunctivae are normal.  Pupils are equal, round, and reactive to light.  Neck: Neck supple. No tracheal deviation present. No thyromegaly present.  Cardiovascular: Normal rate and regular rhythm.   No murmur heard. Pulmonary/Chest: Effort normal and breath sounds normal.  Abdominal: Soft. Bowel sounds are normal. He exhibits no distension. There is no tenderness.  Genitourinary: Penis normal. Guaiac negative stool.       Diminished tone black stool  Musculoskeletal: He exhibits no edema and no tenderness.       Muscular atrophy of all 4 extremities  Neurological: He is alert. Coordination normal.       Response to verbal stimulus with nonpurposeful movement spastic paalysis of all 4 extremities.   Skin: Skin is warm and dry. No rash noted.  Psychiatric: He has a normal mood and affect.    ED Course  Procedures (including critical care time) Results for orders placed during the hospital encounter of 11/08/11  COMPREHENSIVE METABOLIC PANEL      Component Value Range   Sodium 118 (*) 135 - 145 mEq/L   Potassium 4.6  3.5 - 5.1 mEq/L   Chloride 86 (*) 96 - 112 mEq/L   CO2 19  19 - 32 mEq/L   Glucose, Bld 85  70 - 99 mg/dL   BUN 11  6 - 23 mg/dL   Creatinine, Ser 6.96  0.50 - 1.35 mg/dL   Calcium 8.6  8.4 - 29.5 mg/dL   Total Protein 7.9  6.0 - 8.3 g/dL   Albumin 3.4 (*) 3.5 - 5.2 g/dL   AST 44 (*) 0 - 37 U/L   ALT 8  0 - 53 U/L   Alkaline Phosphatase 162 (*) 39 - 117 U/L   Total Bilirubin 0.4  0.3 - 1.2 mg/dL   GFR calc non Af Amer >90  >90 mL/min   GFR calc Af Amer >90  >90 mL/min  OCCULT BLOOD, POC DEVICE      Component Value Range   Fecal Occult Bld NEGATIVE    CBC WITH DIFFERENTIAL      Component Value Range   WBC 12.5 (*) 4.0 - 10.5 K/uL   RBC 4.50  4.22 - 5.81 MIL/uL   Hemoglobin 10.8 (*) 13.0 - 17.0 g/dL   HCT 28.4 (*) 13.2 - 44.0 %   MCV 72.2 (*) 78.0 - 100.0 fL   MCH 24.0 (*) 26.0 - 34.0 pg   MCHC  33.2  30.0 - 36.0 g/dL   RDW 16.1  09.6 - 04.5 %   Platelets 310  150 - 400 K/uL    Neutrophils Relative PENDING  43 - 77 %   Neutro Abs PENDING  1.7 - 7.7 K/uL   Band Neutrophils PENDING  0 - 10 %   Lymphocytes Relative PENDING  12 - 46 %   Lymphs Abs PENDING  0.7 - 4.0 K/uL   Monocytes Relative PENDING  3 - 12 %   Monocytes Absolute PENDING  0.1 - 1.0 K/uL   Eosinophils Relative PENDING  0 - 5 %   Eosinophils Absolute PENDING  0.0 - 0.7 K/uL   Basophils Relative PENDING  0 - 1 %   Basophils Absolute PENDING  0.0 - 0.1 K/uL   WBC Morphology PENDING     RBC Morphology PENDING     Smear Review PENDING     nRBC PENDING  0 /100 WBC   Metamyelocytes Relative PENDING     Myelocytes PENDING     Promyelocytes Absolute PENDING     Blasts PENDING     Ct Abdomen Pelvis Wo Contrast  10/28/2011  *RADIOLOGY REPORT*  Clinical Data:  back pain, uti, abdominal distension  CT ABDOMEN AND PELVIS WITHOUT CONTRAST  Technique:  Multidetector CT imaging of the abdomen and pelvis was performed following the standard protocol without intravenous contrast.  Comparison: 11/20/2010  Findings: Dependent changes are noted within both lung bases.  No pericardial or pleural effusion.  No focal liver abnormalities.  The gallbladder appears normal. Normal appearance of the spleen.  The adrenal glands are normal.  Punctate stone is noted within the lower pole collecting system of the right kidney.  No right-sided obstructive uropathy.  Within the urinary bladder in the region of the right UVJ there is a stone which measures 0.8 x 0.6 cm, image 77.  Cyst within the upper pole of the left kidney is incompletely characterized without IV contrast.  No left-sided hydronephrosis or hydroureter.  No upper abdominal adenopathy.  There is no pelvic or inguinal adenopathy.  Gastrostomy tube is noted.  The stomach is otherwise unremarkable.  The small bowel loops have a normal caliber.  Normal appearance of the proximal colon.  Moderate retained stool is identified within the rectum.  Review of the visualized bony  structures is significant for osteopenia.  Mild degenerative disc disease is present.  Mild endplate compression deformity is noted at the T11 and T12 levels.  IMPRESSION:  1.  Nonobstructing right renal calculus. 2.  8 mm stone is noted within the urinary bladder near the right UVJ.  This is not result in any significant hydronephrosis or hydroureter.  Original Report Authenticated By: Rosealee Albee, M.D.   Dg Chest 2 View  10/28/2011  *RADIOLOGY REPORT*  Clinical Data: Upper respiratory tract infection  CHEST - 2 VIEW  Comparison: 11/20/2010  Findings: Heart size is normal.  No pleural effusion or edema.  Lung volumes are low and there is atelectasis in the left base.  Coarsened interstitial markings are noted bilaterally.  No consolidation noted.  IMPRESSION:  1.  Low lung volumes and left base atelectasis. 2.  Chronic interstitial coarsening.  Original Report Authenticated By: Rosealee Albee, M.D.   Ir Replc Gastro/colonic Tube Percut W/fluoro  10/11/2011  *RADIOLOGY REPORT*  Clinical Data: 56 year old with a percutaneous gastrostomy tube which was inadvertently displaced.  The patient presents to interventional radiology from the [facility] emergency department for tube placement.  GASTROSTOMY CATHETER REPLACEMENT  Fluoro time:  1.1 minutes  Comparison: Prior radiograph confirming gastrostomy tube location 03/11/2011  PROCEDURE/FINDINGS: The patient was placed supine on angiographic table. The skin was prepped and draped in a standard sterile fashion.  The skin track was navigated with a wire and angled catheter.  The wire was removed and hand injection of contrast material through the catheter confirmed intragastric location.  The wire was then replaced, and a new feeding tube advanced over the wire.  The balloon was inflated and pulled against the anterior abdominal wall.  A final hand injection of contrast material through the tube confirmed intragastric location.  The tube was then secured in  place with the rubber bumper.  There are no immediate complications, the patient tolerated the procedure well and was returned to emergency department in good condition.  IMPRESSION:  Successful replacement of gastrostomy tube under fluoroscopic guidance.  Original Report Authenticated By: Alvino Blood Abd Acute W/chest  11/08/2011  *RADIOLOGY REPORT*  Clinical Data: Difficulty feeding.  Abdominal pain.  ACUTE ABDOMEN SERIES (ABDOMEN 2 VIEW & CHEST 1 VIEW)  Comparison: C T 10/28/2011  Findings: NG tube projects over the stomach.  Gaseous distention of bowel throughout the abdomen and pelvis, suspect ileus at this appears to involve both large and small bowel.  No free air.  No organomegaly.  Mild cardiomegaly.  Lungs are clear.  No effusions.  IMPRESSION: Diffuse gaseous distention of bowel.  Suspect ileus.  Original Report Authenticated By: Cyndie Chime, M.D.   Dg Abd Acute W/chest  10/11/2011  *RADIOLOGY REPORT*  Clinical Data: Abdominal pain.  Distension.  Gastrostomy tube.  ACUTE ABDOMEN SERIES (ABDOMEN 2 VIEW & CHEST 1 VIEW)  Comparison: Chest 11/20/2010, abdomen 03/11/2011.  Findings: Shallow inspiration.  Normal heart size and pulmonary vascularity.  No focal airspace consolidation in the lungs. Bilateral apical pleural thickening.  No pneumothorax.  No blunting of costophrenic angles.  Suggestion of soft tissue prominence in the base of the neck on the right.  This is similar to the previous study and could represent thyroid mass or prominent lymph node.  Mild gaseous prominence of small and large bowel similar to previous study.  Changes are likely to represent ileus.  Low colonic obstruction is not excluded.  Changes are similar to the previous study.  Gastrostomy tube is not visualized.  No free intra- abdominal air.  No abnormal air fluid levels.  Degenerative changes in the lumbar spine.  IMPRESSION: Gas filled moderately prominent small and large bowel most consistent with ileus although low  colonic obstruction such as sigmoid volvulus is not excluded.  Findings are similar to the previous study.  Original Report Authenticated By: Marlon Pel, M.D.     Labs Reviewed  OCCULT BLOOD, POC DEVICE  POCT CBG (FASTING - GLUCOSE)-MANUAL ENTRY  COMPREHENSIVE METABOLIC PANEL   No results found.   Date: 11/08/2011  Rate: 70  Rhythm: normal sinus rhythm  QRS Axis: left  Intervals: normal  ST/T Wave abnormalities: nonspecific T wave changes  Conduction Disutrbances:none  Narrative Interpretation:   Old EKG Reviewed: none available  No diagnosis found. Nurse attempted to your gait gastrostomy tube. Patient vomited each shortly thereafter, a small amount.  MDM  Plan  IV hydration Diagnoses #1 near syncope #2 adynamic ileus #3 hyponatremia        Doug Sou, MD 11/08/11 1717

## 2011-11-08 NOTE — Progress Notes (Signed)
[name] pcp per family Pt residing at [facility]

## 2011-11-08 NOTE — ED Notes (Signed)
Pt awaiting bed assignment 

## 2011-11-08 NOTE — ED Notes (Addendum)
10cc of gastric content aspirated from pts peg, peg then flushed with 30cc of sterile water without difficulty. Dr. Shela Commons aware.

## 2011-11-08 NOTE — ED Notes (Signed)
Pt in from [facility]. Pt had a brain injury in 2007, has left arm paralysis, and peg tube feedings. Over the past few weeks pt has been complaining of lower back pain and abd pain. Pt has been to hospital multiple times for back and abd pain. Staff at [facility] stopped pts peg feedings on Saturday, due to ?blockage found on recent abd xray. Pt was given 1L of D5 today, while staff was removing pts IV, pt had a near syncopal episode. Reports pts BP dropped, did not loose consciousness. Pt became diaphoretic, and pale. Pt now back to baseline per family. States pt is still pale but otherwise back to baseline.

## 2011-11-08 NOTE — ED Notes (Signed)
Pt noted to be vomiting. Dr Shela Commons aware.

## 2011-11-08 NOTE — H&P (Signed)
[facility] History and Physical  [name] ZOX:[id number] DOB: [birth year] DOA: 11/08/2011  Referring physician: Ethelda Chick PCP: No primary provider on file.   Chief Complaint: vomiting, ?presyncope  HPI:  The patient is a 56 year old male resident of [facility] with history significant for  quadriplegia with aphasia secondary to neurologic trauma,  adrenal insufficiency, seizures, Parkinson's disease who presents with above complaints. The history is obtained from chart review, ED staff and family at bedside as patient is nonverbal. They report that for a few days last week patient and had occasional vomiting and it was suspected that he had a blockage so his tube feedings were stopped on Saturday. Today it was reported per nursing home staff that he had a near syncopal episode lasting about 40 seconds and the patient and did not lose consciousness, and so was brought to the ED. In the ED abdomen x-rays showed an ileus,labs revealed a sodium of 118 with borderline blood pressures. he is admitted for further evaluation and management. It is noted that patient was seen in the ED 8/2 with URI symptoms and back pain - CT scan revealed a non-obstructing right renal calculus, uti  and at that time was found to have a sodium of 125 was sent back to nursing facility on abx and was receiving subcutaneous fluids there.  Review of Systems:  As per history of present illness, otherwise unobtainable.  Past Medical History  Diagnosis Date  . Quadriplegia   . Depression   . Anemia   . Parkinson's disease   . Seizures   . Adrenal insufficiency    Past Surgical History  Procedure Date  . Peg tube placement   . Peripherally inserted central catheter insertion    Social History:  reports that he has never smoked. He has never used smokeless tobacco. He reports that he does not drink alcohol or use illicit drugs. where does patient live-SNF-[facility] Can patient participate in ADLs-  no  Allergies  Allergen Reactions  . Aspirin     unk reaction  . Nsaids     unk reaction  . Penicillins     unk reaction    History reviewed. No pertinent family history.  Prior to Admission medications   Medication Sig Start Date End Date Taking? Authorizing Provider  acetaminophen (TYLENOL) 325 MG tablet Place 650 mg into feeding tube daily.    Yes Historical Provider, MD  acetaminophen (TYLENOL) 650 MG suppository Place 650 mg rectally every 4 (four) hours as needed. For pain   Yes Historical Provider, MD  baclofen (LIORESAL) 10 MG tablet 10 mg by PEG Tube route 3 (three) times daily.    Yes Historical Provider, MD  bisacodyl (DULCOLAX) 10 MG suppository Place 10 mg rectally daily as needed. For constipation   Yes Historical Provider, MD  carbidopa-levodopa (SINEMET) 25-100 MG per tablet 1 tablet by PEG Tube route 3 (three) times daily.    Yes Historical Provider, MD  cetirizine (ZYRTEC) 5 MG tablet 5 mg by PEG Tube route daily.    Yes Historical Provider, MD  esomeprazole (NEXIUM) 20 MG packet 20 mg by PEG Tube route daily.    Yes Historical Provider, MD  ferrous sulfate 220 (44 FE) MG/5ML solution 330 mg by PEG Tube route daily.   Yes Historical Provider, MD  fludrocortisone (FLORINEF) 0.1 MG tablet 0.1 mg by PEG Tube route every evening.    Yes Historical Provider, MD  folic acid (FOLVITE) 1 MG tablet 1 mg by PEG Tube  route daily.    Yes Historical Provider, MD  guaiFENesin (ROBITUSSIN) 100 MG/5ML liquid Place 200 mg into feeding tube 4 (four) times daily as needed. For cough   Yes Historical Provider, MD  HYDROcodone-acetaminophen (NORCO/VICODIN) 5-325 MG per tablet Take 1 tablet by mouth every 4 (four) hours as needed. For pain   Yes Historical Provider, MD  ipratropium-albuterol (DUONEB) 0.5-2.5 (3) MG/3ML SOLN Take 3 mLs by nebulization every 6 (six) hours as needed. For shortness of breath   Yes Historical Provider, MD  Nutritional Supplements (TWOCAL HN) LIQD Take 237 mLs by  mouth continuous. Run@@50cc /hr for 20 hrs. Turn pump on in an one hour after dilantin dose   Yes Historical Provider, MD  olopatadine (PATANOL) 0.1 % ophthalmic solution Place 1 drop into both eyes daily.    Yes Historical Provider, MD  phenytoin (DILANTIN) 100 MG/4ML suspension 150 mg by PEG Tube route 2 (two) times daily.    Yes Historical Provider, MD  polyethylene glycol (MIRALAX / GLYCOLAX) packet 17 g by PEG Tube route 2 (two) times daily.    Yes Historical Provider, MD  polyvinyl alcohol (LIQUIFILM TEARS) 1.4 % ophthalmic solution Place 1 drop into both eyes daily.    Yes Historical Provider, MD  predniSONE (DELTASONE) 10 MG tablet 10 mg by PEG Tube route daily.   Yes Historical Provider, MD  traMADol (ULTRAM) 50 MG tablet 50 mg by Feeding Tube route every 6 (six) hours as needed. For pain   Yes Historical Provider, MD  cephALEXin (KEFLEX) 500 MG capsule Take 1 capsule (500 mg total) by mouth 4 (four) times daily. 10/28/11 11/07/11  Hilario Quarry, MD   Physical Exam: Filed Vitals:   11/08/11 1415 11/08/11 1730 11/08/11 1732 11/08/11 1830  BP: 98/56  106/63 107/70  Pulse: 69 75  76  Temp:      TempSrc:      Resp: 21 20  19   SpO2: 99% 98%  98%    Constitutional: Vital signs reviewed.  His alert, nonverbal, not following simple is commands at this time. Head: Normocephalic and atraumatic Mouth: no erythema or exudatesdry MM Eyes: PERRL, EOMI, conjunctivae normal, No scleral icterus.  Neck: Supple, Trachea midline normal ROM, No JVD, mass, thyromegaly, or carotid bruit present.  Cardiovascular: RRR, S1 normal, S2 normal, no MRG, pulses symmetric and intact bilaterally Pulmonary/Chest: CTAB, no wheezes, rales, or rhonchi Abdominal: Soft. Non-tender, non-distended, bowel sounds present, PEG tube in left upper quadrant no masses, organomegaly, or guarding present.  Musculoskeletal/extremities: Muscle wasting in all extremities, with contractures noted in left upper extremity.no cyanosis no  edema  Hematology: no cervical, inginal, or axillary adenopathy.  Neurological: Alert, nonverbal, muscle wasting noted as above, does not follow simple commands Skin: Warm, dry and intact. No rash.    Labs on Admission:  Basic Metabolic Panel:  Lab 11/08/11 1610  NA 118*  K 4.6  CL 86*  CO2 19  GLUCOSE 85  BUN 11  CREATININE 0.52  CALCIUM 8.6  MG --  PHOS --   Liver Function Tests:  Lab 11/08/11 1520  AST 44*  ALT 8  ALKPHOS 162*  BILITOT 0.4  PROT 7.9  ALBUMIN 3.4*   No results found for this basename: LIPASE:5,AMYLASE:5 in the last 168 hours No results found for this basename: AMMONIA:5 in the last 168 hours CBC:  Lab 11/08/11 1635  WBC 12.5*  NEUTROABS 9.9*  HGB 10.8*  HCT 32.5*  MCV 72.2*  PLT 310   Cardiac Enzymes: No results  found for this basename: CKTOTAL:5,CKMB:5,CKMBINDEX:5,TROPONINI:5 in the last 168 hours  BNP (last 3 results) No results found for this basename: PROBNP:3 in the last 8760 hours CBG: No results found for this basename: GLUCAP:5 in the last 168 hours  Radiological Exams on Admission: Dg Abd Acute W/chest  11/08/2011  *RADIOLOGY REPORT*  Clinical Data: Difficulty feeding.  Abdominal pain.  ACUTE ABDOMEN SERIES (ABDOMEN 2 VIEW & CHEST 1 VIEW)  Comparison: C T 10/28/2011  Findings: NG tube projects over the stomach.  Gaseous distention of bowel throughout the abdomen and pelvis, suspect ileus at this appears to involve both large and small bowel.  No free air.  No organomegaly.  Mild cardiomegaly.  Lungs are clear.  No effusions.  IMPRESSION: Diffuse gaseous distention of bowel.  Suspect ileus.  Original Report Authenticated By: Cyndie Chime, M.D.     Assessment/Plan Active Problems:  ileus/Nausea & vomiting -As discussed above, we'll keep n.p.o. hydrate with IV fluids follow -CT abdomen and pelvis in the a.m., follow and further manage accordingly. Hyponatremia -Likely secondary to volume depletion the patient also with adrenal  insufficiency and on chronic steroids -Hydrate with normal saline as above -Stress dose steroids -Follow and monitor Bmets further treat accordingly, monitor closely in step down  Adrenal insufficiency -As discussed above stress dose steroids and follow.  Quadriplegia Leukocytosis -Likely secondary to chronic steroids, obtain urinalysis with cultures and a chest x-ray and follow.   Family Communication:family- mother, [name] and brother at bedside Disposition Plan: admit to step down  Time spent: >73mins  Kela Millin [facility] Pager [phone]  If 7PM-7AM, please contact night-coverage [email or web address] Password [facility] 11/08/2011, 6:45 PM

## 2011-11-08 NOTE — ED Notes (Signed)
Attempted IV access x2.  ?

## 2011-11-08 NOTE — ED Notes (Signed)
RUE:[id number]<JW> Expected date:11/08/11<BR> Expected time: 1:11 PM<BR> Means of arrival:Ambulance<BR> Comments:<BR> ems

## 2011-11-08 NOTE — ED Notes (Signed)
Admitting MD at bedside.

## 2011-11-08 NOTE — ED Notes (Signed)
Unable to obtain blood for labs. Main lab phlebotomist is coming down to attempt

## 2011-11-09 ENCOUNTER — Inpatient Hospital Stay (HOSPITAL_COMMUNITY): Payer: Medicare Other

## 2011-11-09 DIAGNOSIS — E2749 Other adrenocortical insufficiency: Secondary | ICD-10-CM | POA: Diagnosis not present

## 2011-11-09 DIAGNOSIS — K560 Paralytic ileus: Secondary | ICD-10-CM | POA: Diagnosis not present

## 2011-11-09 DIAGNOSIS — R55 Syncope and collapse: Secondary | ICD-10-CM | POA: Diagnosis not present

## 2011-11-09 DIAGNOSIS — E876 Hypokalemia: Secondary | ICD-10-CM | POA: Diagnosis not present

## 2011-11-09 DIAGNOSIS — N281 Cyst of kidney, acquired: Secondary | ICD-10-CM | POA: Diagnosis not present

## 2011-11-09 DIAGNOSIS — N210 Calculus in bladder: Secondary | ICD-10-CM | POA: Diagnosis not present

## 2011-11-09 LAB — BASIC METABOLIC PANEL
BUN: 6 mg/dL (ref 6–23)
BUN: 6 mg/dL (ref 6–23)
CO2: 24 mEq/L (ref 19–32)
CO2: 26 mEq/L (ref 19–32)
Chloride: 95 mEq/L — ABNORMAL LOW (ref 96–112)
Chloride: 101 mEq/L (ref 96–112)
Chloride: 100 mEq/L (ref 96–112)
Chloride: 101 mEq/L (ref 96–112)
Creatinine, Ser: 0.53 mg/dL (ref 0.50–1.35)
Creatinine, Ser: 0.53 mg/dL (ref 0.50–1.35)
GFR calc Af Amer: >90 mL/min (ref >90)
GFR calc Af Amer: >90 mL/min (ref >90)
Glucose, Bld: 107 mg/dL — ABNORMAL HIGH (ref 70–99)
Glucose, Bld: 125 mg/dL — ABNORMAL HIGH (ref 70–99)
Glucose, Bld: 114 mg/dL — ABNORMAL HIGH (ref 70–99)
Potassium: 4.8 mEq/L (ref 3.5–5.1)
Potassium: 4 mEq/L (ref 3.5–5.1)
Potassium: 3.9 mEq/L (ref 3.5–5.1)
Sodium: 130 mEq/L — ABNORMAL LOW (ref 135–145)

## 2011-11-09 MED ORDER — IOHEXOL 300 MG/ML  SOLN
100.0000 mL | Freq: Once | INTRAMUSCULAR | Status: AC | PRN
  Administered 2011-11-09: 100 mL via INTRAVENOUS

## 2011-11-09 MED ORDER — ENOXAPARIN SODIUM 40 MG/0.4ML ~~LOC~~ SOLN
40.0000 mg | SUBCUTANEOUS | Status: DC
  Administered 2011-11-09 – 2011-11-10 (×2): 40 mg via SUBCUTANEOUS
  Filled 2011-11-09 (×3): qty 0.4

## 2011-11-09 MED ORDER — DEXTROSE-NACL 5-0.9 % IV SOLN
INTRAVENOUS | Status: DC
  Administered 2011-11-09 – 2011-11-10 (×3): via INTRAVENOUS

## 2011-11-09 NOTE — Progress Notes (Signed)
Checked for residual from PEG tube and no residual was drawn. I then flushed the tube with 60cc of sterile water. No resistance was noted and PEG flushed with great ease. Will see how the pt tolerates flushing the tube and if he does not experience any N/V, will notify MD and take further orders from that point.

## 2011-11-09 NOTE — Progress Notes (Signed)
INITIAL ADULT NUTRITION ASSESSMENT Date: 11/09/2011   Time: 1:09 PM  Reason for Assessment: Low Braden, Nutrition risk: dysphagia, home TF  INTERVENTION: 1. When medically able, restart enteral nutrition. TwoCal HN is not on the formulary, so recommend initiate Osmolite 1.5 per Tube Feeding Substitution List at goal rate of 65 ml/hr run continuously over 20 hours. Hold for 1 hour prior to dilantin dose. This will provide 1950 kcal, 82 g protein, and 990 ml free water.  2. If unable to initiate enteral nutrition within 48 hours, consider TPN due to ileus.   ASSESSMENT: Male 56 y.o.  Dx: Ileus  Hx:  Past Medical History  Diagnosis Date  . Quadriplegia   . Depression   . Anemia   . Parkinson's disease   . Seizures   . Adrenal insufficiency     Related Meds:     . carbidopa-levodopa  1 tablet Per Tube TID  . enoxaparin (LOVENOX) injection  40 mg Subcutaneous Q24H  . hydrocortisone sod succinate (SOLU-CORTEF) injection  50 mg Intravenous Q8H  . olopatadine  1 drop Both Eyes Daily  . pantoprazole (PROTONIX) IV  40 mg Intravenous Q12H  . phenytoin (DILANTIN) IV  100 mg Intravenous Q8H  . polyvinyl alcohol  1 drop Both Eyes Daily  . DISCONTD: enoxaparin (LOVENOX) injection  30 mg Subcutaneous Q24H   Ht: 6\' 2"  (188 cm)  Wt: 153 lb 14.1 oz (69.8 kg)  Ideal Wt: 86.4 % Ideal Wt: 81%  Usual Wt: Unknown % Usual Wt: Unknown  Body mass index is 19.76 kg/(m^2).  Food/Nutrition Related Hx: Patient living at nursing home, receiving TF via PEG.   Labs:  CMP     Component Value Date/Time   NA 134* 11/09/2011 1125   K 4.1 11/09/2011 1125   CL 100 11/09/2011 1125   CO2 27 11/09/2011 1125   GLUCOSE 114* 11/09/2011 1125   BUN 6 11/09/2011 1125   CREATININE 0.53 11/09/2011 1125   CALCIUM 9.3 11/09/2011 1125   PROT 7.9 11/08/2011 1520   ALBUMIN 3.4* 11/08/2011 1520   AST 44* 11/08/2011 1520   ALT 8 11/08/2011 1520   ALKPHOS 162* 11/08/2011 1520   BILITOT 0.4 11/08/2011 1520   GFRNONAA >90  11/09/2011 1125   GFRAA >90 11/09/2011 1125     Intake/Output Summary (Last 24 hours) at 11/09/11 1317 Last data filed at 11/09/11 1200  Gross per 24 hour  Intake 896.67 ml  Output   2200 ml  Net -1303.33 ml    Diet Order: NPO  Supplements/Tube Feeding: None  IVF:    dextrose 5 % and 0.9% NaCl Last Rate: 100 mL/hr at 11/09/11 1200  DISCONTD: sodium chloride   DISCONTD: dextrose 5 % and 0.9% NaCl Last Rate: 100 mL/hr at 11/08/11 1853    Estimated Nutritional Needs:   Kcal: 1900-2050 kcal Protein: 83-98 g  Fluid: 2.4 L  Patient with quadriplegia with aphasia secondary to neurologic trauma. He lives at a nursing facility and received TF via PEG. Per shadow chart, Patient receiving TwoCal HN at 50 ml/hr over 20 hours (held 1 hour prior to dilantin dose) which provides 2000 kcal, 83 g protein, and 700 ml free water. Patient with Stage 2 pressure ulcer on sacrum.   NUTRITION DIAGNOSIS: -Inadequate oral intake (NI-2.1).  Status: Ongoing  RELATED TO: dysphagia  AS EVIDENCE BY: NPO status/ileus  MONITORING/EVALUATION(Goals): Patient will meet 90-100% of estimated nutrition needs  Monitor: TF initiation/tolerance, weights, labs  EDUCATION NEEDS: -No education needs identified at this time  DOCUMENTATION CODES Per approved criteria  -Not Applicable    Linnell Fulling [facility] 11/09/2011, 1:09 PM

## 2011-11-09 NOTE — Progress Notes (Signed)
[facility] PROGRESS NOTE  [name] ZOX:[id number] DOB: 1955/05/08 DOA: 11/08/2011 PCP: [name], MD  Assessment/Plan: Active Problems:  Nausea & vomiting  Ileus  Hyponatremia  Adrenal insufficiency  Quadriplegia  ileus/Nausea & vomiting  -Improving, no further nausea vomiting reported CT of abdomen and pelvis with air-fluid levels in the distal colon compatible with question diarrheal losses (per radiology) but patient has had no reported diarrhea. -Nursing to do trial of flushing, and then start medications via PEG if patient tolerates, then follow up in am and resume tube feedings, if still not tolerating will need a GI consult Hyponatremia  -Likely secondary to volume depletion the patient also with adrenal insufficiency and on chronic steroids  -Improved with hydration and stress dose steroids -Stress dose steroids  -Follow and monitor Bmets further treat accordingly, monitor closely in step down  Adrenal insufficiency  -Hemodynamically stable-but BP still soft today so we'll continue current  stress dose steroids, follow taper hydrocortisone and resume prednisone and Florinef when appropriate. Quadriplegia  -CODE STATUS discussed with his mother, he is a full code. Leukocytosis  -Likely secondary to chronic steroids, urinalysis unremarkable, chest x-ray with no acute infiltrates  Code status: FULL  Family Communication: Mother not available at bedside also called to [phone] but no response Disposition Plan: Transfer to telemetry today, then to nursing facility when medically stable   Brief narrative: The patient is a 56 year old male resident of [facility] with history significant for quadriplegia with aphasia secondary to neurologic trauma, adrenal insufficiency, seizures, Parkinson's disease who presents with vomiting and presyncope and was found to be hyponatremic with a sodium of 118. He had abdominal x-rays are done in the ED which showed an  ileus. He was admitted for further evaluation and management.   Consultants:  none  Procedures:  none  Antibiotics:  none  HPI/Subjective: Patient remains alert, nonverbal. No further vomiting reported today.  Objective: Filed Vitals:   11/09/11 1000 11/09/11 1100 11/09/11 1144 11/09/11 1200  BP:   105/72   Pulse: 82 90 88   Temp:    98.7 F (37.1 C)  TempSrc:    Oral  Resp: 21 18 17    Height:      Weight:      SpO2: 99% 99% 100%     Intake/Output Summary (Last 24 hours) at 11/09/11 1440 Last data filed at 11/09/11 1200  Gross per 24 hour  Intake 896.67 ml  Output   2200 ml  Net -1303.33 ml   Filed Weights   11/08/11 2030 11/09/11 0246  Weight: 70.4 kg (155 lb 3.3 oz) 69.8 kg (153 lb 14.1 oz)    Exam:   General:  Alert, nonverbal, in no apparent distress.  Cardiovascular: Regular rate and rhythm, normal S1-S2  Respiratory: Clear to auscultation bilaterally, no crackles or wheezes      Abdomen: Soft. Non-tender, non-distended, bowel sounds present, PEG tube in left upper quadrant no masses, organomegaly, or guarding present.    Data Reviewed: Basic Metabolic Panel:  Lab 11/09/11 8295 11/09/11 0745 11/09/11 0310 11/08/11 2248 11/08/11 2049  NA 134* 134* 130* 126* 123*  K 4.1 4.0 4.8 3.9 3.7  CL 100 101 95* 93* 88*  CO2 27 24 24 23 22   GLUCOSE 114* 125* 107* 117* 96  BUN 6 6 8 9 11   CREATININE 0.53 0.51 0.53 0.47* 0.51  CALCIUM 9.3 9.0 9.0 8.4 8.6  MG -- -- -- -- --  PHOS -- -- -- -- --  Liver Function Tests:  Lab 11/08/11 1520  AST 44*  ALT 8  ALKPHOS 162*  BILITOT 0.4  PROT 7.9  ALBUMIN 3.4*   No results found for this basename: LIPASE:5,AMYLASE:5 in the last 168 hours No results found for this basename: AMMONIA:5 in the last 168 hours CBC:  Lab 11/08/11 1635  WBC 12.5*  NEUTROABS 9.9*  HGB 10.8*  HCT 32.5*  MCV 72.2*  PLT 310   Cardiac Enzymes: No results found for this basename: CKTOTAL:5,CKMB:5,CKMBINDEX:5,TROPONINI:5 in  the last 168 hours BNP (last 3 results) No results found for this basename: PROBNP:3 in the last 8760 hours CBG: No results found for this basename: GLUCAP:5 in the last 168 hours  Recent Results (from the past 240 hour(s))  MRSA PCR SCREENING     Status: Abnormal   Collection Time   11/08/11  9:06 PM      Component Value Range Status Comment   MRSA by PCR POSITIVE (*) NEGATIVE Final      Studies: Ct Abdomen Pelvis Wo Contrast  10/28/2011  *RADIOLOGY REPORT*  Clinical Data:  back pain, uti, abdominal distension  CT ABDOMEN AND PELVIS WITHOUT CONTRAST  Technique:  Multidetector CT imaging of the abdomen and pelvis was performed following the standard protocol without intravenous contrast.  Comparison: 11/20/2010  Findings: Dependent changes are noted within both lung bases.  No pericardial or pleural effusion.  No focal liver abnormalities.  The gallbladder appears normal. Normal appearance of the spleen.  The adrenal glands are normal.  Punctate stone is noted within the lower pole collecting system of the right kidney.  No right-sided obstructive uropathy.  Within the urinary bladder in the region of the right UVJ there is a stone which measures 0.8 x 0.6 cm, image 77.  Cyst within the upper pole of the left kidney is incompletely characterized without IV contrast.  No left-sided hydronephrosis or hydroureter.  No upper abdominal adenopathy.  There is no pelvic or inguinal adenopathy.  Gastrostomy tube is noted.  The stomach is otherwise unremarkable.  The small bowel loops have a normal caliber.  Normal appearance of the proximal colon.  Moderate retained stool is identified within the rectum.  Review of the visualized bony structures is significant for osteopenia.  Mild degenerative disc disease is present.  Mild endplate compression deformity is noted at the T11 and T12 levels.  IMPRESSION:  1.  Nonobstructing right renal calculus. 2.  8 mm stone is noted within the urinary bladder near the right  UVJ.  This is not result in any significant hydronephrosis or hydroureter.  Original Report Authenticated By: Rosealee Albee, M.D.   Dg Chest 2 View  10/28/2011  *RADIOLOGY REPORT*  Clinical Data: Upper respiratory tract infection  CHEST - 2 VIEW  Comparison: 11/20/2010  Findings: Heart size is normal.  No pleural effusion or edema.  Lung volumes are low and there is atelectasis in the left base.  Coarsened interstitial markings are noted bilaterally.  No consolidation noted.  IMPRESSION:  1.  Low lung volumes and left base atelectasis. 2.  Chronic interstitial coarsening.  Original Report Authenticated By: Rosealee Albee, M.D.   Ct Abdomen Pelvis W Contrast  11/09/2011  *RADIOLOGY REPORT*  Clinical Data: Nausea and vomiting.  Parkinson's disease. Quadriplegia.  Anemia.  Adrenal insufficiency.  CT ABDOMEN AND PELVIS WITH CONTRAST  Technique:  Multidetector CT imaging of the abdomen and pelvis was performed following the standard protocol during bolus administration of intravenous contrast.  Contrast: OMNIPAQUE IOHEXOL  300 MG/ML  SOLN  Comparison: 11/08/2011; 10/28/2011  Findings: Breathing motion artifact reduces diagnostic sensitivity and specificity.  The liver, spleen, pancreas, and adrenal glands appear unremarkable.  The gallbladder and biliary system appear unremarkable.  Air fluid levels are present including in the distal colon suspicious for diarrheal process.  A peg tube is present in the stomach.  No pathologic retroperitoneal or porta hepatis adenopathy is identified.  2.3 cm left mid kidney cyst noted.  A smaller hypodense lesion of the left kidney measuring 7 mm in diameters technically nonspecific due to small size.  The right kidney appears normal.  Abdominal aortic atherosclerosis noted. No pathologic pelvic adenopathy is identified.  Calcifications along the left posterior urinary bladder wall have migrated compared the prior exam from earlier this month and accordingly represent  loose bladder calculi.  Bladder calcification was previously along the right posterior urinary bladder.  Superior endplate compression fractures are again noted at T11 and T12; the T12 superior endplate compression has increased sclerosis and accordingly probably a subacute component, with 2 mm of posterior retropulsion.  IMPRESSION:  1.  Increased sclerosis along the T12 superior endplate compression fracture, with 2 mm of posterior bony retropulsion, compatible with acute / subacute compression fracture. 2.  Air fluid levels in the distal colon are compatible with diarrheal process.  3.  Loose calculi are present in the urinary bladder. 4.  Left renal cyst and a second nonspecific small left renal lesion.  Original Report Authenticated By: Dellia Cloud, M.D.   Ir Replc Gastro/colonic Tube Percut W/fluoro  10/11/2011  *RADIOLOGY REPORT*  Clinical Data: 56 year old with a percutaneous gastrostomy tube which was inadvertently displaced.  The patient presents to interventional radiology [facility] for tube placement.  GASTROSTOMY CATHETER REPLACEMENT  Fluoro time:  1.1 minutes  Comparison: Prior radiograph confirming gastrostomy tube location 03/11/2011  PROCEDURE/FINDINGS: The patient was placed supine on angiographic table. The skin was prepped and draped in a standard sterile fashion.  The skin track was navigated with a wire and angled catheter.  The wire was removed and hand injection of contrast material through the catheter confirmed intragastric location.  The wire was then replaced, and a new feeding tube advanced over the wire.  The balloon was inflated and pulled against the anterior abdominal wall.  A final hand injection of contrast material through the tube confirmed intragastric location.  The tube was then secured in place with the rubber bumper.  There are no immediate complications, the patient tolerated the procedure well and was returned to emergency  department in good condition.  IMPRESSION:  Successful replacement of gastrostomy tube under fluoroscopic guidance.  Original Report Authenticated By: Alvino Blood Abd Acute W/chest  11/08/2011  *RADIOLOGY REPORT*  Clinical Data: Difficulty feeding.  Abdominal pain.  ACUTE ABDOMEN SERIES (ABDOMEN 2 VIEW & CHEST 1 VIEW)  Comparison: C T 10/28/2011  Findings: NG tube projects over the stomach.  Gaseous distention of bowel throughout the abdomen and pelvis, suspect ileus at this appears to involve both large and small bowel.  No free air.  No organomegaly.  Mild cardiomegaly.  Lungs are clear.  No effusions.  IMPRESSION: Diffuse gaseous distention of bowel.  Suspect ileus.  Original Report Authenticated By: Cyndie Chime, M.D.   Dg Abd Acute W/chest  10/11/2011  *RADIOLOGY REPORT*  Clinical Data: Abdominal pain.  Distension.  Gastrostomy tube.  ACUTE ABDOMEN SERIES (ABDOMEN 2 VIEW & CHEST 1 VIEW)  Comparison: Chest 11/20/2010, abdomen  03/11/2011.  Findings: Shallow inspiration.  Normal heart size and pulmonary vascularity.  No focal airspace consolidation in the lungs. Bilateral apical pleural thickening.  No pneumothorax.  No blunting of costophrenic angles.  Suggestion of soft tissue prominence in the base of the neck on the right.  This is similar to the previous study and could represent thyroid mass or prominent lymph node.  Mild gaseous prominence of small and large bowel similar to previous study.  Changes are likely to represent ileus.  Low colonic obstruction is not excluded.  Changes are similar to the previous study.  Gastrostomy tube is not visualized.  No free intra- abdominal air.  No abnormal air fluid levels.  Degenerative changes in the lumbar spine.  IMPRESSION: Gas filled moderately prominent small and large bowel most consistent with ileus although low colonic obstruction such as sigmoid volvulus is not excluded.  Findings are similar to the previous study.  Original Report Authenticated By:  Marlon Pel, M.D.    Scheduled Meds:   . carbidopa-levodopa  1 tablet Per Tube TID  . enoxaparin (LOVENOX) injection  40 mg Subcutaneous Q24H  . hydrocortisone sod succinate (SOLU-CORTEF) injection  50 mg Intravenous Q8H  . olopatadine  1 drop Both Eyes Daily  . pantoprazole (PROTONIX) IV  40 mg Intravenous Q12H  . phenytoin (DILANTIN) IV  100 mg Intravenous Q8H  . polyvinyl alcohol  1 drop Both Eyes Daily  . DISCONTD: enoxaparin (LOVENOX) injection  30 mg Subcutaneous Q24H   Continuous Infusions:   . dextrose 5 % and 0.9% NaCl 100 mL/hr at 11/09/11 1200  . DISCONTD: sodium chloride    . DISCONTD: dextrose 5 % and 0.9% NaCl 100 mL/hr at 11/08/11 1853    Active Problems:  Nausea & vomiting  Ileus  Hyponatremia  Adrenal insufficiency  Quadriplegia    Time spent:    [facility] C  [facility] Pager [phone]. If 8PM-8AM, please contact night-coverage at [email or web address], password [facility] 11/09/2011, 2:40 PM  LOS: 1 day

## 2011-11-09 NOTE — Progress Notes (Signed)
CARE MANAGEMENT NOTE 11/09/2011  Patient:  [name], [name]   Account Number:  [id number]  Date Initiated:  11/09/2011  Documentation initiated by:  DAVIS,RHONDA  Subjective/Objective Assessment:   hyponatremia on ad 125, iv bouls, ct scan confirms ileus, near syncopal episode at snf     Action/Plan:   from [facility] sn[facility], hx of traumatic brain injury and quadapelgia. can move hands and upper ext some, communication is through cards. Communicates well with Mother.   Anticipated DC Date:  11/12/2011   Anticipated DC Plan:  SKILLED NURSING FACILITY  In-house referral  Clinical Social Worker      DC Planning Services  NA      [facility] Choice  NA   Choice offered to / List presented to:  NA   DME arranged  NA      DME agency  NA     HH arranged  NA      HH agency  NA   Status of service:  In process, will continue to follow Medicare Important Message given?  NA - LOS <3 / Initial given by admissions (If response is "NO", the following Medicare IM given date fields will be blank) Date Medicare IM given:   Date Additional Medicare IM given:    Discharge Disposition:    Per UR Regulation:  Reviewed for med. necessity/level of care/duration of stay  If discussed at Long Length of Stay Meetings, dates discussed:    Comments:  [id number] Earlene Plater, RN, BSN, CCM: CHART REVIEWED AND UPDATED. NO DISCHARGE NEEDS PRESENT AT THIS TIME. CASE MANAGEMENT [phone]

## 2011-11-09 NOTE — Clinical Social Work Psychosocial (Signed)
Clinical Social Work Department BRIEF PSYCHOSOCIAL ASSESSMENT 11/09/2011  Patient:  [name], [name]     Account Number:  [id number]     Admit date:  11/08/2011  Clinical Social Worker:  Jodelle Red  Date/Time:  11/09/2011 11:30 AM  Referred by:  Physician  Date Referred:  11/08/2011 Referred for  SNF Placement   Other Referral:   admitted from SNF   Interview type:  Family Other interview type:   Pt, but not very verbal.    PSYCHOSOCIAL DATA Living Status:  FACILITY Admitted from facility:  [facility] Level of care:  Skilled Nursing Facility Primary support name:  Trindon Dorton Primary support relationship to patient:  PARENT Degree of support available:   very good from mother    CURRENT CONCERNS Current Concerns  Post-Acute Placement   Other Concerns:   return to SNF.    SOCIAL WORK ASSESSMENT / PLAN CSW met with Pt and mother at the bedside. Pt has been at [facility] for about 5 yrs and they plan for him to return at d/c. Pt is basically nonverbal, but can understand staff. His mother does a great job at understanding Pt, advocating for his needs and handles paperwork for him. Family appreciative and open to CSW following for return.   Assessment/plan status:  Psychosocial Support/Ongoing Assessment of Needs Other assessment/ plan:   return to SNF   Information/referral to community resources:    PATIENT'S/FAMILY'S RESPONSE TO PLAN OF CARE: Family appreciative and open to CSW following for returnn to SNF.  CSW to update FL2 and will follow.    Doreen Salvage, LCSWA Clinical Social Worker [facility] [phone]

## 2011-11-10 DIAGNOSIS — R55 Syncope and collapse: Secondary | ICD-10-CM | POA: Diagnosis not present

## 2011-11-10 DIAGNOSIS — F329 Major depressive disorder, single episode, unspecified: Secondary | ICD-10-CM | POA: Diagnosis not present

## 2011-11-10 DIAGNOSIS — E876 Hypokalemia: Secondary | ICD-10-CM

## 2011-11-10 DIAGNOSIS — G8250 Quadriplegia, unspecified: Secondary | ICD-10-CM

## 2011-11-10 DIAGNOSIS — R112 Nausea with vomiting, unspecified: Secondary | ICD-10-CM

## 2011-11-10 DIAGNOSIS — F3289 Other specified depressive episodes: Secondary | ICD-10-CM

## 2011-11-10 DIAGNOSIS — D649 Anemia, unspecified: Secondary | ICD-10-CM

## 2011-11-10 DIAGNOSIS — E2749 Other adrenocortical insufficiency: Secondary | ICD-10-CM | POA: Diagnosis not present

## 2011-11-10 LAB — BASIC METABOLIC PANEL
CO2: 26 mEq/L (ref 19–32)
Calcium: 8.8 mg/dL (ref 8.4–10.5)
Chloride: 105 mEq/L (ref 96–112)
Glucose, Bld: 111 mg/dL — ABNORMAL HIGH (ref 70–99)
Potassium: 3.1 mEq/L — ABNORMAL LOW (ref 3.5–5.1)
Sodium: 140 mEq/L (ref 135–145)

## 2011-11-10 LAB — URINE CULTURE: Culture: NO GROWTH

## 2011-11-10 MED ORDER — CHLORHEXIDINE GLUCONATE CLOTH 2 % EX PADS
6.0000 | MEDICATED_PAD | Freq: Every day | CUTANEOUS | Status: DC
  Administered 2011-11-10 – 2011-11-11 (×2): 6 via TOPICAL

## 2011-11-10 MED ORDER — MUPIROCIN 2 % EX OINT
1.0000 "application " | TOPICAL_OINTMENT | Freq: Two times a day (BID) | CUTANEOUS | Status: DC
  Administered 2011-11-10 – 2011-11-11 (×3): 1 via NASAL
  Filled 2011-11-10: qty 22

## 2011-11-10 MED ORDER — OSMOLITE 1.5 CAL PO LIQD
1000.0000 mL | ORAL | Status: DC
  Administered 2011-11-10: 1000 mL
  Filled 2011-11-10 (×2): qty 1000

## 2011-11-10 MED ORDER — OSMOLITE 1.5 CAL PO LIQD
1000.0000 mL | ORAL | Status: DC
  Filled 2011-11-10: qty 1000

## 2011-11-10 NOTE — Progress Notes (Signed)
[facility] PROGRESS NOTE  [name] BMW:[id number] DOB: 1956/01/26 DOA: 11/08/2011 PCP: [name], MD  Assessment/Plan: Active Problems:  Nausea & vomiting  Ileus  Hyponatremia  Adrenal insufficiency  Quadriplegia Hypokalemia  ileus/Nausea & vomiting  -Improving, no further nausea vomiting reported CT of abdomen and pelvis with air-fluid levels in the distal colon compatible with question diarrheal losses (per radiology) but patient has had no reported diarrhea.  -Nursing completed trial of flushing without any difficulties.  No nausea or emesis reported.  Have discussed with nutrition and will restart tube feeds today.  At this juncture will hold IVF's.  Should patient not tolerate feeds will continue bowel rest and IVF's and obtain abdominal xray.  Hyponatremia  -Likely secondary to volume depletion the patient also with adrenal insufficiency and on chronic steroids  -Improved with hydration and stress dose steroids  -Stress dose steroids  -Follow and monitor Bmets further treat accordingly, monitor closely in step down   Hypokalemia - Will restart tube feeds today and expect this to improve once feeds restarted.  Will recheck level tomorrow in the AM.  Adrenal insufficiency  -Hemodynamically stable-BP's improved today will continue current stress dose steroids, follow taper hydrocortisone and resume prednisone and Florinef when appropriate.   Quadriplegia  -CODE STATUS discussed with his mother, he is a full code.   Leukocytosis  -Likely secondary to chronic steroids, urinalysis unremarkable, chest x-ray with no acute infiltrates   Code status: FULL  Family Communication: Mother not available at bedside also called to [phone] but no response  Disposition Plan: Transfer to telemetry today, then to nursing facility when medically stable   Brief narrative:  The patient is a 56 year old male resident of [facility] with history significant for  quadriplegia with aphasia secondary to neurologic trauma, adrenal insufficiency, seizures, Parkinson's disease who presents with vomiting and presyncope and was found to be hyponatremic with a sodium of 118. He had abdominal x-rays are done in the ED which showed an ileus. He was admitted for further evaluation and management.  Consultants:  none Procedures:  none Antibiotics:  None   HPI/Subjective: No acute issues reported overnight.   Objective: Filed Vitals:   11/09/11 1707 11/09/11 2143 11/10/11 0626 11/10/11 1430  BP: 117/67 116/72 132/75 127/76  Pulse: 82 86 84 85  Temp: 98 F (36.7 C) 98.2 F (36.8 C) 97.6 F (36.4 C) 97.7 F (36.5 C)  TempSrc: Oral Oral Oral Oral  Resp: 15 16 18 20   Height:      Weight:      SpO2: 99% 97% 98% 98%    Intake/Output Summary (Last 24 hours) at 11/10/11 1502 Last data filed at 11/10/11 1450  Gross per 24 hour  Intake   1600 ml  Output   2376 ml  Net   -776 ml   Filed Weights   11/08/11 2030 11/09/11 0246  Weight: 70.4 kg (155 lb 3.3 oz) 69.8 kg (153 lb 14.1 oz)    Exam:   General:  Pt in NAD, Alert and Awake  Cardiovascular: RRR, No MRG  Respiratory: No increased WOB, no wheezes  Abdomen: G tube in place, NT, ND.  Data Reviewed: Basic Metabolic Panel:  Lab 11/10/11 4403 11/09/11 1500 11/09/11 1125 11/09/11 0745 11/09/11 0310  NA 140 136 134* 134* 130*  K 3.1* 3.9 4.1 4.0 4.8  CL 105 101 100 101 95*  CO2 26 26 27 24 24   GLUCOSE 111* 124* 114* 125* 107*  BUN 4* 6 6 6  8  CREATININE 0.51 0.53 0.53 0.51 0.53  CALCIUM 8.8 9.2 9.3 9.0 9.0  MG -- -- -- -- --  PHOS -- -- -- -- --   Liver Function Tests:  Lab 11/08/11 1520  AST 44*  ALT 8  ALKPHOS 162*  BILITOT 0.4  PROT 7.9  ALBUMIN 3.4*   No results found for this basename: LIPASE:5,AMYLASE:5 in the last 168 hours No results found for this basename: AMMONIA:5 in the last 168 hours CBC:  Lab 11/08/11 1635  WBC 12.5*  NEUTROABS 9.9*  HGB 10.8*  HCT 32.5*    MCV 72.2*  PLT 310   Cardiac Enzymes: No results found for this basename: CKTOTAL:5,CKMB:5,CKMBINDEX:5,TROPONINI:5 in the last 168 hours BNP (last 3 results) No results found for this basename: PROBNP:3 in the last 8760 hours CBG: No results found for this basename: GLUCAP:5 in the last 168 hours  Recent Results (from the past 240 hour(s))  MRSA PCR SCREENING     Status: Abnormal   Collection Time   11/08/11  9:06 PM      Component Value Range Status Comment   MRSA by PCR POSITIVE (*) NEGATIVE Final   URINE CULTURE     Status: Normal   Collection Time   11/08/11 10:39 PM      Component Value Range Status Comment   Specimen Description URINE, CLEAN CATCH   Final    Special Requests NONE   Final    Culture  Setup Time 11/09/2011 02:10   Final    Colony Count NO GROWTH   Final    Culture NO GROWTH   Final    Report Status 11/10/2011 FINAL   Final      Studies: Ct Abdomen Pelvis Wo Contrast  10/28/2011  *RADIOLOGY REPORT*  Clinical Data:  back pain, uti, abdominal distension  CT ABDOMEN AND PELVIS WITHOUT CONTRAST  Technique:  Multidetector CT imaging of the abdomen and pelvis was performed following the standard protocol without intravenous contrast.  Comparison: 11/20/2010  Findings: Dependent changes are noted within both lung bases.  No pericardial or pleural effusion.  No focal liver abnormalities.  The gallbladder appears normal. Normal appearance of the spleen.  The adrenal glands are normal.  Punctate stone is noted within the lower pole collecting system of the right kidney.  No right-sided obstructive uropathy.  Within the urinary bladder in the region of the right UVJ there is a stone which measures 0.8 x 0.6 cm, image 77.  Cyst within the upper pole of the left kidney is incompletely characterized without IV contrast.  No left-sided hydronephrosis or hydroureter.  No upper abdominal adenopathy.  There is no pelvic or inguinal adenopathy.  Gastrostomy tube is noted.  The stomach  is otherwise unremarkable.  The small bowel loops have a normal caliber.  Normal appearance of the proximal colon.  Moderate retained stool is identified within the rectum.  Review of the visualized bony structures is significant for osteopenia.  Mild degenerative disc disease is present.  Mild endplate compression deformity is noted at the T11 and T12 levels.  IMPRESSION:  1.  Nonobstructing right renal calculus. 2.  8 mm stone is noted within the urinary bladder near the right UVJ.  This is not result in any significant hydronephrosis or hydroureter.  Original Report Authenticated By: Rosealee Albee, M.D.   Dg Chest 2 View  10/28/2011  *RADIOLOGY REPORT*  Clinical Data: Upper respiratory tract infection  CHEST - 2 VIEW  Comparison: 11/20/2010  Findings: Heart  size is normal.  No pleural effusion or edema.  Lung volumes are low and there is atelectasis in the left base.  Coarsened interstitial markings are noted bilaterally.  No consolidation noted.  IMPRESSION:  1.  Low lung volumes and left base atelectasis. 2.  Chronic interstitial coarsening.  Original Report Authenticated By: Rosealee Albee, M.D.   Ct Abdomen Pelvis W Contrast  11/09/2011  *RADIOLOGY REPORT*  Clinical Data: Nausea and vomiting.  Parkinson's disease. Quadriplegia.  Anemia.  Adrenal insufficiency.  CT ABDOMEN AND PELVIS WITH CONTRAST  Technique:  Multidetector CT imaging of the abdomen and pelvis was performed following the standard protocol during bolus administration of intravenous contrast.  Contrast: OMNIPAQUE IOHEXOL 300 MG/ML  SOLN  Comparison: 11/08/2011; 10/28/2011  Findings: Breathing motion artifact reduces diagnostic sensitivity and specificity.  The liver, spleen, pancreas, and adrenal glands appear unremarkable.  The gallbladder and biliary system appear unremarkable.  Air fluid levels are present including in the distal colon suspicious for diarrheal process.  A peg tube is present in the stomach.  No pathologic  retroperitoneal or porta hepatis adenopathy is identified.  2.3 cm left mid kidney cyst noted.  A smaller hypodense lesion of the left kidney measuring 7 mm in diameters technically nonspecific due to small size.  The right kidney appears normal.  Abdominal aortic atherosclerosis noted. No pathologic pelvic adenopathy is identified.  Calcifications along the left posterior urinary bladder wall have migrated compared the prior exam from earlier this month and accordingly represent loose bladder calculi.  Bladder calcification was previously along the right posterior urinary bladder.  Superior endplate compression fractures are again noted at T11 and T12; the T12 superior endplate compression has increased sclerosis and accordingly probably a subacute component, with 2 mm of posterior retropulsion.  IMPRESSION:  1.  Increased sclerosis along the T12 superior endplate compression fracture, with 2 mm of posterior bony retropulsion, compatible with acute / subacute compression fracture. 2.  Air fluid levels in the distal colon are compatible with diarrheal process.  3.  Loose calculi are present in the urinary bladder. 4.  Left renal cyst and a second nonspecific small left renal lesion.  Original Report Authenticated By: Dellia Cloud, M.D.   Dg Abd Acute W/chest  11/08/2011  *RADIOLOGY REPORT*  Clinical Data: Difficulty feeding.  Abdominal pain.  ACUTE ABDOMEN SERIES (ABDOMEN 2 VIEW & CHEST 1 VIEW)  Comparison: C T 10/28/2011  Findings: NG tube projects over the stomach.  Gaseous distention of bowel throughout the abdomen and pelvis, suspect ileus at this appears to involve both large and small bowel.  No free air.  No organomegaly.  Mild cardiomegaly.  Lungs are clear.  No effusions.  IMPRESSION: Diffuse gaseous distention of bowel.  Suspect ileus.  Original Report Authenticated By: Cyndie Chime, M.D.    Scheduled Meds:   . carbidopa-levodopa  1 tablet Per Tube TID  . Chlorhexidine Gluconate Cloth  6  each Topical Q0600  . enoxaparin (LOVENOX) injection  40 mg Subcutaneous Q24H  . hydrocortisone sod succinate (SOLU-CORTEF) injection  50 mg Intravenous Q8H  . mupirocin ointment  1 application Nasal BID  . olopatadine  1 drop Both Eyes Daily  . pantoprazole (PROTONIX) IV  40 mg Intravenous Q12H  . phenytoin (DILANTIN) IV  100 mg Intravenous Q8H  . polyvinyl alcohol  1 drop Both Eyes Daily   Continuous Infusions:   . dextrose 5 % and 0.9% NaCl 100 mL/hr at 11/10/11 0843    Active Problems:  Nausea & vomiting  Ileus  Hyponatremia  Adrenal insufficiency  Quadriplegia    Time spent: > 30 minutes    Penny Pia  [facility] Pager [phone]. If 8PM-8AM, please contact night-coverage at [email or web address], password [facility] 11/10/2011, 3:02 PM  LOS: 2 days

## 2011-11-10 NOTE — Progress Notes (Addendum)
Nutrition Follow Up Note  Patient seen for full assessment by RD on 8/14. Patient home TF is TwoCalHN,  TwoCal HN is not on the formulary, so recommend initiate Osmolite 1.5 per Tube Feeding Substitution List at goal rate of 65 ml/hr run continuously over 20 hours. Hold for 1 hour prior to dilantin dose. This will provide 1950 kcal, 82 g protein, and 990 ml free water. Per family, pt has not received any feedings since Saturday. Received verbal order from MD to initiate TF. Will monitor tolerance.   RD to follow for nutrition plan of care.   Iven Finn [facility] [phone]

## 2011-11-11 DIAGNOSIS — E871 Hypo-osmolality and hyponatremia: Secondary | ICD-10-CM | POA: Diagnosis not present

## 2011-11-11 DIAGNOSIS — K560 Paralytic ileus: Secondary | ICD-10-CM | POA: Diagnosis not present

## 2011-11-11 DIAGNOSIS — G8250 Quadriplegia, unspecified: Secondary | ICD-10-CM | POA: Diagnosis not present

## 2011-11-11 DIAGNOSIS — E876 Hypokalemia: Secondary | ICD-10-CM | POA: Diagnosis not present

## 2011-11-11 DIAGNOSIS — R112 Nausea with vomiting, unspecified: Secondary | ICD-10-CM | POA: Diagnosis not present

## 2011-11-11 DIAGNOSIS — G20 Parkinson's disease: Secondary | ICD-10-CM | POA: Diagnosis not present

## 2011-11-11 LAB — BASIC METABOLIC PANEL
BUN: 5 mg/dL — ABNORMAL LOW (ref 6–23)
CO2: 23 mEq/L (ref 19–32)
Chloride: 100 mEq/L (ref 96–112)
GFR calc non Af Amer: >90 mL/min (ref >90)
Glucose, Bld: 93 mg/dL (ref 70–99)
Potassium: 3.4 mEq/L — ABNORMAL LOW (ref 3.5–5.1)
Sodium: 135 mEq/L (ref 135–145)

## 2011-11-11 MED ORDER — PREDNISONE 10 MG PO TABS
10.0000 mg | ORAL_TABLET | Freq: Every day | ORAL | Status: DC
  Administered 2011-11-11: 10 mg via ORAL
  Filled 2011-11-11: qty 1

## 2011-11-11 MED ORDER — POTASSIUM CHLORIDE CRYS ER 20 MEQ PO TBCR
40.0000 meq | EXTENDED_RELEASE_TABLET | Freq: Once | ORAL | Status: AC
  Administered 2011-11-11: 40 meq via ORAL
  Filled 2011-11-11: qty 2

## 2011-11-11 MED ORDER — FLUDROCORTISONE ACETATE 0.1 MG PO TABS
0.1000 mg | ORAL_TABLET | Freq: Every evening | ORAL | Status: DC
  Filled 2011-11-11: qty 1

## 2011-11-11 MED ORDER — MUPIROCIN 2 % EX OINT: 1.0000 "application " | TOPICAL_OINTMENT | Freq: Two times a day (BID) | CUTANEOUS | Status: AC

## 2011-11-11 MED ORDER — POLYETHYLENE GLYCOL 3350 17 G PO PACK
17.0000 g | PACK | Freq: Two times a day (BID) | ORAL | Status: DC
  Administered 2011-11-11: 17 g via ORAL
  Filled 2011-11-11 (×2): qty 1

## 2011-11-11 MED ORDER — HYDROCODONE-ACETAMINOPHEN 5-325 MG PO TABS: 1.0000 | ORAL_TABLET | ORAL | Status: DC | PRN

## 2011-11-11 MED ORDER — CHLORHEXIDINE GLUCONATE CLOTH 2 % EX PADS: 6.0000 | MEDICATED_PAD | Freq: Every day | CUTANEOUS | Status: DC

## 2011-11-11 MED ORDER — PHENYTOIN 100 MG/4ML PO SUSP
150.0000 mg | Freq: Two times a day (BID) | ORAL | Status: DC
  Filled 2011-11-11 (×18): qty 6

## 2011-11-11 MED ORDER — PHENYTOIN 100 MG/4ML PO SUSP
150.0000 mg | Freq: Two times a day (BID) | ORAL | Status: DC
  Filled 2011-11-11: qty 6

## 2011-11-11 NOTE — Progress Notes (Signed)
Report called to [employer]. Discharge instructions reviewed with RN. All questions answered. Julio Sicks RN

## 2011-11-11 NOTE — Care Management Note (Signed)
    Page 1 of 2   11/11/2011     4:05:39 PM   CARE MANAGEMENT NOTE 11/11/2011  Patient:  [name], [name]   Account Number:  [id number]  Date Initiated:  11/09/2011  Documentation initiated by:  DAVIS,RHONDA  Subjective/Objective Assessment:   hyponatremia on ad 125, iv bouls, ct scan confirms ileus, near syncopal episode at snf     Action/Plan:   from [facility] snf, hx of traumatic brain injury and quadapelgia. can move hands and upper ext some, communication is through cards. Communicates well with Mother.   Anticipated DC Date:  11/11/2011   Anticipated DC Plan:  SKILLED NURSING FACILITY  In-house referral  Clinical Social Worker      DC Planning Services  NA      [facility] Choice  NA   Choice offered to / List presented to:  NA   DME arranged  NA      DME agency  NA     HH arranged  NA      HH agency  NA   Status of service:  Completed, signed off Medicare Important Message given?  NA - LOS <3 / Initial given by admissions (If response is "NO", the following Medicare IM given date fields will be blank) Date Medicare IM given:   Date Additional Medicare IM given:    Discharge Disposition:  SKILLED NURSING FACILITY  Per UR Regulation:  Reviewed for med. necessity/level of care/duration of stay  If discussed at Long Length of Stay Meetings, dates discussed:    Comments:  11/11/11 [facility] RN,BSN NCM [phone] TRANFER FROM ICU.TF.D/C PLAN SNF.  [id number] Earlene Plater, RN, BSN, CCM: CHART REVIEWED AND UPDATED. NO DISCHARGE NEEDS PRESENT AT THIS TIME. CASE MANAGEMENT [phone]

## 2011-11-11 NOTE — Progress Notes (Signed)
Patient cleared for discharge. Packet copied and placed in [facility]. ptar called for transportation. CSW called patients mother and informed of d/c.  Zyhir Cappella C. Lennix Kneisel MSW, LCSW [phone]

## 2011-11-11 NOTE — Discharge Summary (Signed)
Physician Discharge Summary  [name] XBM:[id number] DOB: February 22, 1956 DOA: 11/08/2011  PCP: [name], MD  Admit date: 11/08/2011 Discharge date: 11/11/2011  Recommendations for Outpatient Follow-up:  1. Titrate tube feedings to goal 2. Hold tube feeds for an hour before and an hour after dilantin given  Discharge Diagnoses:  Active Problems:  Nausea & vomiting  Ileus  Hyponatremia  Adrenal insufficiency  Quadriplegia  Hypokalemia   Discharge Condition: improved  Diet recommendation: tube feeds  Filed Weights   11/08/11 2030 11/09/11 0246 11/11/11 0625  Weight: 70.4 kg (155 lb 3.3 oz) 69.8 kg (153 lb 14.1 oz) 68.6 kg (151 lb 3.8 oz)    History of present illness:  The patient is a 56 year old male resident of [facility] with history significant for quadriplegia with aphasia secondary to neurologic trauma, adrenal insufficiency, seizures, Parkinson's disease who presents with above complaints. The history is obtained from chart review, ED staff and family at bedside as patient is nonverbal. They report that for a few days last week patient and had occasional vomiting and it was suspected that he had a blockage so his tube feedings were stopped on Saturday. Today it was reported per nursing home staff that he had a near syncopal episode lasting about 40 seconds and the patient and did not lose consciousness, and so was brought to the ED. In the ED abdomen x-rays showed an ileus,labs revealed a sodium of 118 with borderline blood pressures. he is admitted for further evaluation and management.  It is noted that patient was seen in the ED 8/2 with URI symptoms and back pain - CT scan revealed a non-obstructing right renal calculus, uti and at that time was found to have a sodium of 125 was sent back to nursing facility on abx and was receiving subcutaneous fluids there.   Hospital Course:  ileus/Nausea & vomiting  -Improving, no further nausea vomiting reported CT of  abdomen and pelvis with air-fluid levels in the distal colon compatible with question diarrheal losses (per radiology)  -Nursing completed trial of flushing without any difficulties. No nausea or emesis reported. Have discussed with nutrition and will restart tube feeds today. At this juncture will hold IVF's. Should patient not tolerate feeds will continue bowel rest and IVF's and obtain abdominal xray.  +BMs  Hyponatremia  -Likely secondary to volume depletion the patient also with adrenal insufficiency and on chronic steroids  -improved  Hypokalemia  - Will restart tube feeds today and expect this to improve once feeds restarted.   Adrenal insufficiency  As patient has been on stress dose for < 1 week, will plan to change back to home dose as he is tolerating peg feeds Quadriplegia   Leukocytosis  -Likely secondary to chronic steroids, urinalysis unremarkable, chest x-ray with no acute infiltrates     Discharge Exam: Filed Vitals:   11/11/11 0625  BP: 98/64  Pulse: 86  Temp: 97.9 F (36.6 C)  Resp: 18   Filed Vitals:   11/10/11 0626 11/10/11 1430 11/10/11 2203 11/11/11 0625  BP: 132/75 127/76 130/75 98/64  Pulse: 84 85 85 86  Temp: 97.6 F (36.4 C) 97.7 F (36.5 C) 97.3 F (36.3 C) 97.9 F (36.6 C)  TempSrc: Oral Oral Axillary Oral  Resp: 18 20 20 18   Height:      Weight:    68.6 kg (151 lb 3.8 oz)  SpO2: 98% 98% 96% 100%    General: A+Ox3, NAD Cardiovascular: rrr Respiratory: clear anterior Skin: no rashes  or lesions  Discharge Instructions   Medication List  As of 11/11/2011 11:23 AM   ASK your doctor about these medications         acetaminophen 650 MG suppository   Commonly known as: TYLENOL   Place 650 mg rectally every 4 (four) hours as needed. For pain      acetaminophen 325 MG tablet   Commonly known as: TYLENOL   Place 650 mg into feeding tube daily.      baclofen 10 MG tablet   Commonly known as: LIORESAL   10 mg by PEG Tube route 3  (three) times daily.      bisacodyl 10 MG suppository   Commonly known as: DULCOLAX   Place 10 mg rectally daily as needed. For constipation      carbidopa-levodopa 25-100 MG per tablet   Commonly known as: SINEMET IR   1 tablet by PEG Tube route 3 (three) times daily.      cephALEXin 500 MG capsule   Commonly known as: KEFLEX   Take 1 capsule (500 mg total) by mouth 4 (four) times daily.      cetirizine 5 MG tablet   Commonly known as: ZYRTEC   5 mg by PEG Tube route daily.      esomeprazole 20 MG packet   Commonly known as: NEXIUM   20 mg by PEG Tube route daily.      ferrous sulfate 220 (44 FE) MG/5ML solution   330 mg by PEG Tube route daily.      fludrocortisone 0.1 MG tablet   Commonly known as: FLORINEF   0.1 mg by PEG Tube route every evening.      folic acid 1 MG tablet   Commonly known as: FOLVITE   1 mg by PEG Tube route daily.      guaiFENesin 100 MG/5ML liquid   Commonly known as: ROBITUSSIN   Place 200 mg into feeding tube 4 (four) times daily as needed. For cough      HYDROcodone-acetaminophen 5-325 MG per tablet   Commonly known as: NORCO/VICODIN   Take 1 tablet by mouth every 4 (four) hours as needed. For pain      ipratropium-albuterol 0.5-2.5 (3) MG/3ML Soln   Commonly known as: DUONEB   Take 3 mLs by nebulization every 6 (six) hours as needed. For shortness of breath      olopatadine 0.1 % ophthalmic solution   Commonly known as: PATANOL   Place 1 drop into both eyes daily.      phenytoin 100 MG/4ML suspension   Commonly known as: DILANTIN   150 mg by PEG Tube route 2 (two) times daily.      polyethylene glycol packet   Commonly known as: MIRALAX / GLYCOLAX   17 g by PEG Tube route 2 (two) times daily.      polyvinyl alcohol 1.4 % ophthalmic solution   Commonly known as: LIQUIFILM TEARS   Place 1 drop into both eyes daily.      predniSONE 10 MG tablet   Commonly known as: DELTASONE   10 mg by PEG Tube route daily.      traMADol 50  MG tablet   Commonly known as: ULTRAM   50 mg by Feeding Tube route every 6 (six) hours as needed. For pain      TwoCal HN Liqd   Take 237 mLs by mouth continuous. Run@@50cc /hr for 20 hrs. Turn pump on in an one hour after dilantin dose  The results of significant diagnostics from this hospitalization (including imaging, microbiology, ancillary and laboratory) are listed below for reference.    Significant Diagnostic Studies: Ct Abdomen Pelvis Wo Contrast  10/28/2011  *RADIOLOGY REPORT*  Clinical Data:  back pain, uti, abdominal distension  CT ABDOMEN AND PELVIS WITHOUT CONTRAST  Technique:  Multidetector CT imaging of the abdomen and pelvis was performed following the standard protocol without intravenous contrast.  Comparison: 11/20/2010  Findings: Dependent changes are noted within both lung bases.  No pericardial or pleural effusion.  No focal liver abnormalities.  The gallbladder appears normal. Normal appearance of the spleen.  The adrenal glands are normal.  Punctate stone is noted within the lower pole collecting system of the right kidney.  No right-sided obstructive uropathy.  Within the urinary bladder in the region of the right UVJ there is a stone which measures 0.8 x 0.6 cm, image 77.  Cyst within the upper pole of the left kidney is incompletely characterized without IV contrast.  No left-sided hydronephrosis or hydroureter.  No upper abdominal adenopathy.  There is no pelvic or inguinal adenopathy.  Gastrostomy tube is noted.  The stomach is otherwise unremarkable.  The small bowel loops have a normal caliber.  Normal appearance of the proximal colon.  Moderate retained stool is identified within the rectum.  Review of the visualized bony structures is significant for osteopenia.  Mild degenerative disc disease is present.  Mild endplate compression deformity is noted at the T11 and T12 levels.  IMPRESSION:  1.  Nonobstructing right renal calculus. 2.  8 mm stone is noted  within the urinary bladder near the right UVJ.  This is not result in any significant hydronephrosis or hydroureter.  Original Report Authenticated By: Rosealee Albee, M.D.   Dg Chest 2 View  10/28/2011  *RADIOLOGY REPORT*  Clinical Data: Upper respiratory tract infection  CHEST - 2 VIEW  Comparison: 11/20/2010  Findings: Heart size is normal.  No pleural effusion or edema.  Lung volumes are low and there is atelectasis in the left base.  Coarsened interstitial markings are noted bilaterally.  No consolidation noted.  IMPRESSION:  1.  Low lung volumes and left base atelectasis. 2.  Chronic interstitial coarsening.  Original Report Authenticated By: Rosealee Albee, M.D.   Ct Abdomen Pelvis W Contrast  11/09/2011  *RADIOLOGY REPORT*  Clinical Data: Nausea and vomiting.  Parkinson's disease. Quadriplegia.  Anemia.  Adrenal insufficiency.  CT ABDOMEN AND PELVIS WITH CONTRAST  Technique:  Multidetector CT imaging of the abdomen and pelvis was performed following the standard protocol during bolus administration of intravenous contrast.  Contrast: OMNIPAQUE IOHEXOL 300 MG/ML  SOLN  Comparison: 11/08/2011; 10/28/2011  Findings: Breathing motion artifact reduces diagnostic sensitivity and specificity.  The liver, spleen, pancreas, and adrenal glands appear unremarkable.  The gallbladder and biliary system appear unremarkable.  Air fluid levels are present including in the distal colon suspicious for diarrheal process.  A peg tube is present in the stomach.  No pathologic retroperitoneal or porta hepatis adenopathy is identified.  2.3 cm left mid kidney cyst noted.  A smaller hypodense lesion of the left kidney measuring 7 mm in diameters technically nonspecific due to small size.  The right kidney appears normal.  Abdominal aortic atherosclerosis noted. No pathologic pelvic adenopathy is identified.  Calcifications along the left posterior urinary bladder wall have migrated compared the prior exam from  earlier this month and accordingly represent loose bladder calculi.  Bladder calcification was previously along the  right posterior urinary bladder.  Superior endplate compression fractures are again noted at T11 and T12; the T12 superior endplate compression has increased sclerosis and accordingly probably a subacute component, with 2 mm of posterior retropulsion.  IMPRESSION:  1.  Increased sclerosis along the T12 superior endplate compression fracture, with 2 mm of posterior bony retropulsion, compatible with acute / subacute compression fracture. 2.  Air fluid levels in the distal colon are compatible with diarrheal process.  3.  Loose calculi are present in the urinary bladder. 4.  Left renal cyst and a second nonspecific small left renal lesion.  Original Report Authenticated By: Dellia Cloud, M.D.   Dg Abd Acute W/chest  11/08/2011  *RADIOLOGY REPORT*  Clinical Data: Difficulty feeding.  Abdominal pain.  ACUTE ABDOMEN SERIES (ABDOMEN 2 VIEW & CHEST 1 VIEW)  Comparison: C T 10/28/2011  Findings: NG tube projects over the stomach.  Gaseous distention of bowel throughout the abdomen and pelvis, suspect ileus at this appears to involve both large and small bowel.  No free air.  No organomegaly.  Mild cardiomegaly.  Lungs are clear.  No effusions.  IMPRESSION: Diffuse gaseous distention of bowel.  Suspect ileus.  Original Report Authenticated By: Cyndie Chime, M.D.    Microbiology: Recent Results (from the past 240 hour(s))  MRSA PCR SCREENING     Status: Abnormal   Collection Time   11/08/11  9:06 PM      Component Value Range Status Comment   MRSA by PCR POSITIVE (*) NEGATIVE Final   URINE CULTURE     Status: Normal   Collection Time   11/08/11 10:39 PM      Component Value Range Status Comment   Specimen Description URINE, CLEAN CATCH   Final    Special Requests NONE   Final    Culture  Setup Time 11/09/2011 02:10   Final    Colony Count NO GROWTH   Final    Culture NO GROWTH    Final    Report Status 11/10/2011 FINAL   Final      Labs: Basic Metabolic Panel:  Lab 11/11/11 7829 11/10/11 0435 11/09/11 1500 11/09/11 1125 11/09/11 0745  NA 135 140 136 134* 134*  K 3.4* 3.1* 3.9 4.1 4.0  CL 100 105 101 100 101  CO2 23 26 26 27 24   GLUCOSE 93 111* 124* 114* 125*  BUN 5* 4* 6 6 6   CREATININE 0.45* 0.51 0.53 0.53 0.51  CALCIUM 8.7 8.8 9.2 9.3 9.0  MG -- -- -- -- --  PHOS -- -- -- -- --   Liver Function Tests:  Lab 11/08/11 1520  AST 44*  ALT 8  ALKPHOS 162*  BILITOT 0.4  PROT 7.9  ALBUMIN 3.4*   No results found for this basename: LIPASE:5,AMYLASE:5 in the last 168 hours No results found for this basename: AMMONIA:5 in the last 168 hours CBC:  Lab 11/08/11 1635  WBC 12.5*  NEUTROABS 9.9*  HGB 10.8*  HCT 32.5*  MCV 72.2*  PLT 310   Cardiac Enzymes: No results found for this basename: CKTOTAL:5,CKMB:5,CKMBINDEX:5,TROPONINI:5 in the last 168 hours BNP: BNP (last 3 results) No results found for this basename: PROBNP:3 in the last 8760 hours CBG: No results found for this basename: GLUCAP:5 in the last 168 hours  Time coordinating discharge: 35 minutes  Signed:  Marlin Canary  [facility] 11/11/2011, 11:23 AM

## 2011-11-15 DIAGNOSIS — E871 Hypo-osmolality and hyponatremia: Secondary | ICD-10-CM | POA: Diagnosis not present

## 2011-11-15 DIAGNOSIS — G20 Parkinson's disease: Secondary | ICD-10-CM | POA: Diagnosis not present

## 2011-11-15 DIAGNOSIS — R1311 Dysphagia, oral phase: Secondary | ICD-10-CM | POA: Diagnosis not present

## 2011-11-15 DIAGNOSIS — G8250 Quadriplegia, unspecified: Secondary | ICD-10-CM | POA: Diagnosis not present

## 2011-11-15 DIAGNOSIS — R269 Unspecified abnormalities of gait and mobility: Secondary | ICD-10-CM | POA: Diagnosis not present

## 2011-11-15 DIAGNOSIS — K560 Paralytic ileus: Secondary | ICD-10-CM | POA: Diagnosis not present

## 2011-11-15 DIAGNOSIS — R569 Unspecified convulsions: Secondary | ICD-10-CM | POA: Diagnosis not present

## 2011-11-15 DIAGNOSIS — E2749 Other adrenocortical insufficiency: Secondary | ICD-10-CM | POA: Diagnosis not present

## 2011-11-15 DIAGNOSIS — G20A1 Parkinson's disease without dyskinesia, without mention of fluctuations: Secondary | ICD-10-CM | POA: Diagnosis not present

## 2011-12-05 DIAGNOSIS — R141 Gas pain: Secondary | ICD-10-CM | POA: Diagnosis not present

## 2011-12-05 DIAGNOSIS — R143 Flatulence: Secondary | ICD-10-CM | POA: Diagnosis not present

## 2011-12-05 DIAGNOSIS — K5900 Constipation, unspecified: Secondary | ICD-10-CM | POA: Diagnosis not present

## 2011-12-14 DIAGNOSIS — R6889 Other general symptoms and signs: Secondary | ICD-10-CM | POA: Diagnosis not present

## 2011-12-14 DIAGNOSIS — R892 Abnormal level of other drugs, medicaments and biological substances in specimens from other organs, systems and tissues: Secondary | ICD-10-CM | POA: Diagnosis not present

## 2011-12-14 DIAGNOSIS — I10 Essential (primary) hypertension: Secondary | ICD-10-CM | POA: Diagnosis not present

## 2011-12-14 DIAGNOSIS — D649 Anemia, unspecified: Secondary | ICD-10-CM | POA: Diagnosis not present

## 2011-12-14 DIAGNOSIS — R569 Unspecified convulsions: Secondary | ICD-10-CM | POA: Diagnosis not present

## 2011-12-16 DIAGNOSIS — G20 Parkinson's disease: Secondary | ICD-10-CM | POA: Diagnosis not present

## 2011-12-16 DIAGNOSIS — D649 Anemia, unspecified: Secondary | ICD-10-CM | POA: Diagnosis not present

## 2011-12-16 DIAGNOSIS — E2749 Other adrenocortical insufficiency: Secondary | ICD-10-CM | POA: Diagnosis not present

## 2011-12-16 DIAGNOSIS — D509 Iron deficiency anemia, unspecified: Secondary | ICD-10-CM | POA: Diagnosis not present

## 2011-12-20 DIAGNOSIS — D509 Iron deficiency anemia, unspecified: Secondary | ICD-10-CM | POA: Diagnosis not present

## 2011-12-20 DIAGNOSIS — E2749 Other adrenocortical insufficiency: Secondary | ICD-10-CM | POA: Diagnosis not present

## 2011-12-20 DIAGNOSIS — G20 Parkinson's disease: Secondary | ICD-10-CM | POA: Diagnosis not present

## 2011-12-20 DIAGNOSIS — R892 Abnormal level of other drugs, medicaments and biological substances in specimens from other organs, systems and tissues: Secondary | ICD-10-CM | POA: Diagnosis not present

## 2011-12-20 DIAGNOSIS — R569 Unspecified convulsions: Secondary | ICD-10-CM | POA: Diagnosis not present

## 2011-12-20 DIAGNOSIS — R1311 Dysphagia, oral phase: Secondary | ICD-10-CM | POA: Diagnosis not present

## 2012-01-10 DIAGNOSIS — Z79899 Other long term (current) drug therapy: Secondary | ICD-10-CM | POA: Diagnosis not present

## 2012-01-10 DIAGNOSIS — J309 Allergic rhinitis, unspecified: Secondary | ICD-10-CM | POA: Diagnosis not present

## 2012-01-10 DIAGNOSIS — G20 Parkinson's disease: Secondary | ICD-10-CM | POA: Diagnosis not present

## 2012-01-10 DIAGNOSIS — D509 Iron deficiency anemia, unspecified: Secondary | ICD-10-CM | POA: Diagnosis not present

## 2012-01-10 DIAGNOSIS — E2749 Other adrenocortical insufficiency: Secondary | ICD-10-CM | POA: Diagnosis not present

## 2012-01-10 DIAGNOSIS — R569 Unspecified convulsions: Secondary | ICD-10-CM | POA: Diagnosis not present

## 2012-01-12 DIAGNOSIS — Z79899 Other long term (current) drug therapy: Secondary | ICD-10-CM | POA: Diagnosis not present

## 2012-01-12 DIAGNOSIS — E8809 Other disorders of plasma-protein metabolism, not elsewhere classified: Secondary | ICD-10-CM | POA: Diagnosis not present

## 2012-01-16 DIAGNOSIS — R6889 Other general symptoms and signs: Secondary | ICD-10-CM | POA: Diagnosis not present

## 2012-01-16 DIAGNOSIS — Z5181 Encounter for therapeutic drug level monitoring: Secondary | ICD-10-CM | POA: Diagnosis not present

## 2012-01-18 DIAGNOSIS — B351 Tinea unguium: Secondary | ICD-10-CM | POA: Diagnosis not present

## 2012-01-18 DIAGNOSIS — M79609 Pain in unspecified limb: Secondary | ICD-10-CM | POA: Diagnosis not present

## 2012-01-26 DIAGNOSIS — R569 Unspecified convulsions: Secondary | ICD-10-CM | POA: Diagnosis not present

## 2012-02-03 DIAGNOSIS — D649 Anemia, unspecified: Secondary | ICD-10-CM | POA: Diagnosis not present

## 2012-02-03 DIAGNOSIS — I10 Essential (primary) hypertension: Secondary | ICD-10-CM | POA: Diagnosis not present

## 2012-02-03 DIAGNOSIS — R569 Unspecified convulsions: Secondary | ICD-10-CM | POA: Diagnosis not present

## 2012-02-06 DIAGNOSIS — K5900 Constipation, unspecified: Secondary | ICD-10-CM | POA: Diagnosis not present

## 2012-02-06 DIAGNOSIS — F329 Major depressive disorder, single episode, unspecified: Secondary | ICD-10-CM | POA: Diagnosis not present

## 2012-02-06 DIAGNOSIS — D509 Iron deficiency anemia, unspecified: Secondary | ICD-10-CM | POA: Diagnosis not present

## 2012-02-06 DIAGNOSIS — R569 Unspecified convulsions: Secondary | ICD-10-CM | POA: Diagnosis not present

## 2012-02-06 DIAGNOSIS — K219 Gastro-esophageal reflux disease without esophagitis: Secondary | ICD-10-CM | POA: Diagnosis not present

## 2012-02-06 DIAGNOSIS — E2749 Other adrenocortical insufficiency: Secondary | ICD-10-CM | POA: Diagnosis not present

## 2012-02-09 DIAGNOSIS — I10 Essential (primary) hypertension: Secondary | ICD-10-CM | POA: Diagnosis not present

## 2012-02-09 DIAGNOSIS — D649 Anemia, unspecified: Secondary | ICD-10-CM | POA: Diagnosis not present

## 2012-02-28 DIAGNOSIS — G20 Parkinson's disease: Secondary | ICD-10-CM | POA: Diagnosis not present

## 2012-02-28 DIAGNOSIS — D509 Iron deficiency anemia, unspecified: Secondary | ICD-10-CM | POA: Diagnosis not present

## 2012-02-28 DIAGNOSIS — R1311 Dysphagia, oral phase: Secondary | ICD-10-CM | POA: Diagnosis not present

## 2012-02-28 DIAGNOSIS — E2749 Other adrenocortical insufficiency: Secondary | ICD-10-CM | POA: Diagnosis not present

## 2012-02-28 DIAGNOSIS — R892 Abnormal level of other drugs, medicaments and biological substances in specimens from other organs, systems and tissues: Secondary | ICD-10-CM | POA: Diagnosis not present

## 2012-02-28 DIAGNOSIS — R569 Unspecified convulsions: Secondary | ICD-10-CM | POA: Diagnosis not present

## 2012-02-29 DIAGNOSIS — R509 Fever, unspecified: Secondary | ICD-10-CM | POA: Diagnosis not present

## 2012-02-29 DIAGNOSIS — R062 Wheezing: Secondary | ICD-10-CM | POA: Diagnosis not present

## 2012-02-29 DIAGNOSIS — R05 Cough: Secondary | ICD-10-CM | POA: Diagnosis not present

## 2012-03-01 DIAGNOSIS — R569 Unspecified convulsions: Secondary | ICD-10-CM | POA: Diagnosis not present

## 2012-03-15 DIAGNOSIS — I10 Essential (primary) hypertension: Secondary | ICD-10-CM | POA: Diagnosis not present

## 2012-03-23 DIAGNOSIS — I10 Essential (primary) hypertension: Secondary | ICD-10-CM | POA: Diagnosis not present

## 2012-04-11 DIAGNOSIS — B351 Tinea unguium: Secondary | ICD-10-CM | POA: Diagnosis not present

## 2012-04-11 DIAGNOSIS — R262 Difficulty in walking, not elsewhere classified: Secondary | ICD-10-CM | POA: Diagnosis not present

## 2012-04-11 DIAGNOSIS — M79609 Pain in unspecified limb: Secondary | ICD-10-CM | POA: Diagnosis not present

## 2012-04-11 DIAGNOSIS — M21619 Bunion of unspecified foot: Secondary | ICD-10-CM | POA: Diagnosis not present

## 2012-04-16 DIAGNOSIS — D509 Iron deficiency anemia, unspecified: Secondary | ICD-10-CM | POA: Diagnosis not present

## 2012-04-16 DIAGNOSIS — G20 Parkinson's disease: Secondary | ICD-10-CM | POA: Diagnosis not present

## 2012-04-16 DIAGNOSIS — R569 Unspecified convulsions: Secondary | ICD-10-CM | POA: Diagnosis not present

## 2012-04-16 DIAGNOSIS — G378 Other specified demyelinating diseases of central nervous system: Secondary | ICD-10-CM | POA: Diagnosis not present

## 2012-04-16 DIAGNOSIS — R1311 Dysphagia, oral phase: Secondary | ICD-10-CM | POA: Diagnosis not present

## 2012-04-16 DIAGNOSIS — E2749 Other adrenocortical insufficiency: Secondary | ICD-10-CM | POA: Diagnosis not present

## 2012-05-11 DIAGNOSIS — R569 Unspecified convulsions: Secondary | ICD-10-CM | POA: Diagnosis not present

## 2012-05-11 DIAGNOSIS — G20 Parkinson's disease: Secondary | ICD-10-CM | POA: Diagnosis not present

## 2012-05-11 DIAGNOSIS — D509 Iron deficiency anemia, unspecified: Secondary | ICD-10-CM | POA: Diagnosis not present

## 2012-05-11 DIAGNOSIS — K5900 Constipation, unspecified: Secondary | ICD-10-CM | POA: Diagnosis not present

## 2012-05-11 DIAGNOSIS — E2749 Other adrenocortical insufficiency: Secondary | ICD-10-CM | POA: Diagnosis not present

## 2012-05-11 DIAGNOSIS — R1311 Dysphagia, oral phase: Secondary | ICD-10-CM | POA: Diagnosis not present

## 2012-05-15 DIAGNOSIS — H2510 Age-related nuclear cataract, unspecified eye: Secondary | ICD-10-CM | POA: Diagnosis not present

## 2012-05-15 DIAGNOSIS — H4320 Crystalline deposits in vitreous body, unspecified eye: Secondary | ICD-10-CM | POA: Diagnosis not present

## 2012-05-15 DIAGNOSIS — IMO0002 Reserved for concepts with insufficient information to code with codable children: Secondary | ICD-10-CM | POA: Diagnosis not present

## 2012-05-15 DIAGNOSIS — H1010 Acute atopic conjunctivitis, unspecified eye: Secondary | ICD-10-CM | POA: Diagnosis not present

## 2012-06-13 ENCOUNTER — Emergency Department (HOSPITAL_BASED_OUTPATIENT_CLINIC_OR_DEPARTMENT_OTHER): Payer: Medicare Other

## 2012-06-13 ENCOUNTER — Encounter (HOSPITAL_BASED_OUTPATIENT_CLINIC_OR_DEPARTMENT_OTHER): Payer: Self-pay | Admitting: *Deleted

## 2012-06-13 ENCOUNTER — Emergency Department (HOSPITAL_BASED_OUTPATIENT_CLINIC_OR_DEPARTMENT_OTHER)
Admission: EM | Admit: 2012-06-13 | Discharge: 2012-06-14 | Disposition: A | Discharge status: Skilled Nursing Facility | Payer: Medicare Other | Source: Home / Self Care | Attending: Emergency Medicine | Admitting: Emergency Medicine

## 2012-06-13 DIAGNOSIS — R197 Diarrhea, unspecified: Secondary | ICD-10-CM | POA: Diagnosis not present

## 2012-06-13 DIAGNOSIS — R509 Fever, unspecified: Secondary | ICD-10-CM | POA: Diagnosis not present

## 2012-06-13 DIAGNOSIS — G8250 Quadriplegia, unspecified: Secondary | ICD-10-CM | POA: Diagnosis not present

## 2012-06-13 DIAGNOSIS — Z431 Encounter for attention to gastrostomy: Secondary | ICD-10-CM | POA: Diagnosis not present

## 2012-06-13 DIAGNOSIS — J209 Acute bronchitis, unspecified: Secondary | ICD-10-CM | POA: Diagnosis not present

## 2012-06-13 DIAGNOSIS — J40 Bronchitis, not specified as acute or chronic: Secondary | ICD-10-CM

## 2012-06-13 DIAGNOSIS — E871 Hypo-osmolality and hyponatremia: Secondary | ICD-10-CM | POA: Diagnosis not present

## 2012-06-13 DIAGNOSIS — Z0389 Encounter for observation for other suspected diseases and conditions ruled out: Secondary | ICD-10-CM | POA: Diagnosis not present

## 2012-06-13 DIAGNOSIS — F329 Major depressive disorder, single episode, unspecified: Secondary | ICD-10-CM | POA: Diagnosis not present

## 2012-06-13 DIAGNOSIS — E2749 Other adrenocortical insufficiency: Secondary | ICD-10-CM | POA: Diagnosis not present

## 2012-06-13 DIAGNOSIS — N390 Urinary tract infection, site not specified: Secondary | ICD-10-CM | POA: Diagnosis not present

## 2012-06-13 DIAGNOSIS — F3289 Other specified depressive episodes: Secondary | ICD-10-CM | POA: Diagnosis not present

## 2012-06-13 LAB — COMPREHENSIVE METABOLIC PANEL
ALT: 8 U/L (ref 0–53)
Alkaline Phosphatase: 141 U/L — ABNORMAL HIGH (ref 39–117)
BUN: 17 mg/dL (ref 6–23)
CO2: 27 mEq/L (ref 19–32)
Calcium: 9 mg/dL (ref 8.4–10.5)
GFR calc Af Amer: >90 mL/min (ref >90)
GFR calc non Af Amer: >90 mL/min (ref >90)
Glucose, Bld: 92 mg/dL (ref 70–99)
Sodium: 135 mEq/L (ref 135–145)

## 2012-06-13 LAB — URINALYSIS, ROUTINE W REFLEX MICROSCOPIC
Glucose, UA: NEGATIVE mg/dL
Ketones, ur: 15 mg/dL — AB
Protein, ur: 100 mg/dL — AB

## 2012-06-13 LAB — CBC WITH DIFFERENTIAL/PLATELET
Eosinophils Relative: 0 % (ref 0–5)
HCT: 35.3 % — ABNORMAL LOW (ref 39.0–52.0)
Hemoglobin: 11.8 g/dL — ABNORMAL LOW (ref 13.0–17.0)
Lymphocytes Relative: 13 % (ref 12–46)
Lymphs Abs: 1.3 10*3/uL (ref 0.7–4.0)
MCV: 73.1 fL — ABNORMAL LOW (ref 78.0–100.0)
Monocytes Relative: 12 % (ref 3–12)
Platelets: 194 10*3/uL (ref 150–400)
RBC: 4.83 MIL/uL (ref 4.22–5.81)
WBC: 10.1 10*3/uL (ref 4.0–10.5)

## 2012-06-13 LAB — URINE MICROSCOPIC-ADD ON

## 2012-06-13 LAB — LACTIC ACID, PLASMA: Lactic Acid, Venous: 1 mmol/L (ref 0.5–2.2)

## 2012-06-13 MED ORDER — SODIUM CHLORIDE 0.9 % IV SOLN
1000.0000 mL | Freq: Once | INTRAVENOUS | Status: AC
  Administered 2012-06-13: 1000 mL via INTRAVENOUS

## 2012-06-13 MED ORDER — SODIUM CHLORIDE 0.9 % IV SOLN: 1000.0000 mL | INTRAVENOUS | Status: DC

## 2012-06-13 NOTE — ED Notes (Signed)
Patient to [facility] with C/O a fever.  States that he has not been feeling well since last night. Family reports that he may have bronchitis.

## 2012-06-13 NOTE — ED Notes (Signed)
Pt is a resident of [facility]. Per paperwork from nursing home, pt had temp of 98.9 at 1800 and then at 2030 his temp was 101.2. Pt was given Tylenol 650mg  via his g-tube at 2040. Pt began N/V yesterday and portable cxr, cbc and bmp. Results are in chart.

## 2012-06-13 NOTE — ED Provider Notes (Signed)
History     CSN: [id number]  Arrival date & time 06/13/12  2123   First MD Initiated Contact with Patient 06/13/12 2157      Chief Complaint  Patient presents with  . Fever    (Consider location/radiation/quality/duration/timing/severity/associated sxs/prior treatment) HPI The patient presents from his nursing facility with concerns of nausea, vomiting, fever. The patient has a history of quadriplegia, minimally verbal. History of present illness per the patient's mother. She states that the symptoms began 2 days ago, without clear precipitant.  Since onset the patient has had several episodes of emesis, low-grade fever, no new syncope, no new apparent dyspnea, no new diarrhea. Per report the patient's fever improved with Tylenol. The patient himself denies pain. There is some limitation of history of present illness secondary to the patient's cognitive state. He has a notable history of prior systemic infection with significant neurologic dysfunction Past Medical History  Diagnosis Date  . Quadriplegia   . Depression   . Anemia   . Parkinson's disease   . Seizures   . Adrenal insufficiency     Past Surgical History  Procedure Laterality Date  . Peg tube placement    . Peripherally inserted central catheter insertion      No family history on file.  History  Substance Use Topics  . Smoking status: Never Smoker   . Smokeless tobacco: Never Used  . Alcohol Use: No      Review of Systems  Unable to perform ROS: Patient nonverbal    Allergies  Aspirin; Nsaids; and Penicillins  Home Medications   Current Outpatient Rx  Name  Route  Sig  Dispense  Refill  . acetaminophen (TYLENOL) 650 MG suppository   Rectal   Place 650 mg rectally every 4 (four) hours as needed. For pain         . azithromycin (ZITHROMAX) 500 MG tablet   Oral   Take 500 mg by mouth daily.         . baclofen (LIORESAL) 10 MG tablet   PEG Tube   10 mg by PEG Tube route 3 (three)  times daily.          . bisacodyl (DULCOLAX) 10 MG suppository   Rectal   Place 10 mg rectally daily as needed. For constipation         . carbidopa-levodopa (SINEMET) 25-100 MG per tablet   PEG Tube   1 tablet by PEG Tube route 3 (three) times daily.          . cetirizine (ZYRTEC) 5 MG tablet   PEG Tube   5 mg by PEG Tube route daily.          Marland Kitchen docusate (COLACE) 50 MG/5ML liquid   Oral   Take 100 mg by mouth daily.         Marland Kitchen esomeprazole (NEXIUM) 20 MG packet   PEG Tube   20 mg by PEG Tube route daily.          . ferrous sulfate 220 (44 FE) MG/5ML solution   PEG Tube   330 mg by PEG Tube route daily.         . fludrocortisone (FLORINEF) 0.1 MG tablet   PEG Tube   0.1 mg by PEG Tube route every evening.          . folic acid (FOLVITE) 1 MG tablet   PEG Tube   1 mg by PEG Tube route daily.          Marland Kitchen  HYDROcodone-acetaminophen (NORCO/VICODIN) 5-325 MG per tablet   Oral   Take 1 tablet by mouth every 4 (four) hours as needed. For pain   10 tablet   0   . ipratropium-albuterol (DUONEB) 0.5-2.5 (3) MG/3ML SOLN   Nebulization   Take 3 mLs by nebulization every 6 (six) hours as needed. For shortness of breath         . Nutritional Supplements (TWOCAL HN) LIQD   Oral   Take 237 mLs by mouth continuous. Run@@55cc /hr for 20 hrs. Turn pump on in an one hour after dilantin dose         . olopatadine (PATANOL) 0.1 % ophthalmic solution   Both Eyes   Place 1 drop into both eyes daily.          . phenytoin (DILANTIN) 100 MG/4ML suspension   PEG Tube   100 mg by PEG Tube route 2 (two) times daily.          . polyethylene glycol (MIRALAX / GLYCOLAX) packet   PEG Tube   17 g by PEG Tube route 2 (two) times daily.          . polyvinyl alcohol (LIQUIFILM TEARS) 1.4 % ophthalmic solution   Both Eyes   Place 1 drop into both eyes daily.          . predniSONE (DELTASONE) 10 MG tablet   PEG Tube   10 mg by PEG Tube route daily.         Marland Kitchen  saccharomyces boulardii (FLORASTOR) 250 MG capsule   Oral   Take 250 mg by mouth 2 (two) times daily.         . traMADol (ULTRAM) 50 MG tablet   Feeding Tube   50 mg by Feeding Tube route every 6 (six) hours as needed. For pain         . Chlorhexidine Gluconate Cloth 2 % PADS   Topical   Apply 6 each topically daily at 6 (six) AM.   3 each   0   . guaiFENesin (ROBITUSSIN) 100 MG/5ML liquid   Per Tube   Place 200 mg into feeding tube 4 (four) times daily as needed. For cough           BP 105/60  Pulse 95  Temp(Src) 100.5 F (38.1 C) (Oral)  Resp 20  Ht 6\' 4"  (1.93 m)  Wt 150 lb (68.04 kg)  BMI 18.27 kg/m2  SpO2 100%  Physical Exam  Nursing note and vitals reviewed. Constitutional:  Male who appears older than stated age, in no distress  HENT:  Head: Normocephalic and atraumatic.  Eyes: Conjunctivae are normal. Right eye exhibits no discharge. Left eye exhibits no discharge.  Patient moves eyes in all dimensions spontaneously, but does not track consistently  Neck: No tracheal deviation present.  Cardiovascular: Normal rate and regular rhythm.   No murmur heard. Pulmonary/Chest: Effort normal. No stridor. No respiratory distress. He has no wheezes. He has no rales.  Abdominal: Soft. He exhibits no distension.  PEG tube in place and left lower quadrant, nontender  Musculoskeletal:  Contractions throughout with diffuse atrophy  Neurological: He is alert. He displays normal reflexes. A cranial nerve deficit is present. He exhibits abnormal muscle tone. Coordination abnormal.  Skin: Skin is warm and dry.  Psychiatric:  Withdrawn, aphasic    ED Course  Procedures (including critical care time)  A review of patient's chart from his nursing facility demonstrates any labs, upper abdomen, x-ray performed prior to  transfer.  These were largely reassuring, and no evidence of pneumonia, no evidence of G-tube migration.  There is suggestion of bronchitis.  Labs  Reviewed  CBC WITH DIFFERENTIAL - Abnormal; Notable for the following:    Hemoglobin 11.8 (*)    HCT 35.3 (*)    MCV 73.1 (*)    MCH 24.4 (*)    Monocytes Absolute 1.2 (*)    All other components within normal limits  URINE CULTURE  CULTURE, BLOOD (SINGLE)  COMPREHENSIVE METABOLIC PANEL  LACTIC ACID, PLASMA  PROCALCITONIN  URINALYSIS, ROUTINE W REFLEX MICROSCOPIC   No results found.   No diagnosis found.  Pulse ox 96% room air normal  12:33 AM And from the patient's mother all results.  Absent lactic acidosis, leukocytosis, and with only low-grade fever, the patient seems to be responding appropriately to Tylenol.  Absent evidence of systemic infection, or pneumonia, or overt urinary tract infection, though this may be demonstrated on follow up culture, patient is appropriate for discharge. MDM  This patient with multiple medical problems, a nursing home resident, presents with concerns fever or on exam the patient is in no distress, afebrile.  Given the patient's relative immobility there suspicion for pneumonia or other infectious process.  Patient's labs are largely reassuring, though there is suggestion of bronchitis and possible urinary tract infection.  The patient was discharged in stable condition to a monitored facility after initiation of antibiotics.       Gerhard Munch, MD 06/14/12 [phone]

## 2012-06-13 NOTE — ED Notes (Signed)
MD at bedside. 

## 2012-06-14 ENCOUNTER — Non-Acute Institutional Stay (SKILLED_NURSING_FACILITY): Payer: Medicare Other | Admitting: Internal Medicine

## 2012-06-14 ENCOUNTER — Encounter (HOSPITAL_COMMUNITY): Payer: Self-pay | Admitting: Emergency Medicine

## 2012-06-14 ENCOUNTER — Emergency Department (HOSPITAL_COMMUNITY): Payer: Medicare Other

## 2012-06-14 ENCOUNTER — Inpatient Hospital Stay (HOSPITAL_COMMUNITY)
Admission: EM | Admit: 2012-06-14 | Discharge: 2012-06-18 | DRG: 371 | Disposition: A | Discharge status: Skilled Nursing Facility | Payer: Medicare Other | Attending: Internal Medicine | Admitting: Internal Medicine

## 2012-06-14 DIAGNOSIS — Z880 Allergy status to penicillin: Secondary | ICD-10-CM

## 2012-06-14 DIAGNOSIS — Z931 Gastrostomy status: Secondary | ICD-10-CM

## 2012-06-14 DIAGNOSIS — Z79899 Other long term (current) drug therapy: Secondary | ICD-10-CM

## 2012-06-14 DIAGNOSIS — E876 Hypokalemia: Secondary | ICD-10-CM | POA: Diagnosis present

## 2012-06-14 DIAGNOSIS — D638 Anemia in other chronic diseases classified elsewhere: Secondary | ICD-10-CM | POA: Diagnosis present

## 2012-06-14 DIAGNOSIS — N390 Urinary tract infection, site not specified: Secondary | ICD-10-CM | POA: Diagnosis present

## 2012-06-14 DIAGNOSIS — G8250 Quadriplegia, unspecified: Secondary | ICD-10-CM | POA: Diagnosis present

## 2012-06-14 DIAGNOSIS — R112 Nausea with vomiting, unspecified: Secondary | ICD-10-CM

## 2012-06-14 DIAGNOSIS — R509 Fever, unspecified: Secondary | ICD-10-CM | POA: Diagnosis present

## 2012-06-14 DIAGNOSIS — E2740 Unspecified adrenocortical insufficiency: Secondary | ICD-10-CM

## 2012-06-14 DIAGNOSIS — Z228 Carrier of other infectious diseases: Secondary | ICD-10-CM

## 2012-06-14 DIAGNOSIS — D509 Iron deficiency anemia, unspecified: Secondary | ICD-10-CM | POA: Diagnosis not present

## 2012-06-14 DIAGNOSIS — J209 Acute bronchitis, unspecified: Secondary | ICD-10-CM | POA: Diagnosis not present

## 2012-06-14 DIAGNOSIS — E2749 Other adrenocortical insufficiency: Secondary | ICD-10-CM | POA: Diagnosis present

## 2012-06-14 DIAGNOSIS — K567 Ileus, unspecified: Secondary | ICD-10-CM

## 2012-06-14 DIAGNOSIS — F3289 Other specified depressive episodes: Secondary | ICD-10-CM | POA: Diagnosis present

## 2012-06-14 DIAGNOSIS — A0472 Enterocolitis due to Clostridium difficile, not specified as recurrent: Principal | ICD-10-CM | POA: Diagnosis present

## 2012-06-14 DIAGNOSIS — IMO0002 Reserved for concepts with insufficient information to code with codable children: Secondary | ICD-10-CM

## 2012-06-14 DIAGNOSIS — G20A1 Parkinson's disease without dyskinesia, without mention of fluctuations: Secondary | ICD-10-CM | POA: Diagnosis present

## 2012-06-14 DIAGNOSIS — G40909 Epilepsy, unspecified, not intractable, without status epilepticus: Secondary | ICD-10-CM | POA: Diagnosis present

## 2012-06-14 DIAGNOSIS — E871 Hypo-osmolality and hyponatremia: Secondary | ICD-10-CM | POA: Diagnosis present

## 2012-06-14 LAB — COMPREHENSIVE METABOLIC PANEL
AST: 40 U/L — ABNORMAL HIGH (ref 0–37)
Albumin: 3.1 g/dL — ABNORMAL LOW (ref 3.5–5.2)
Alkaline Phosphatase: 123 U/L — ABNORMAL HIGH (ref 39–117)
Chloride: 96 mEq/L (ref 96–112)
Creatinine, Ser: 0.49 mg/dL — ABNORMAL LOW (ref 0.50–1.35)
Potassium: 3.8 mEq/L (ref 3.5–5.1)
Sodium: 132 mEq/L — ABNORMAL LOW (ref 135–145)
Total Bilirubin: 0.3 mg/dL (ref 0.3–1.2)

## 2012-06-14 LAB — CBC WITH DIFFERENTIAL/PLATELET
Eosinophils Absolute: 0.1 10*3/uL (ref 0.0–0.7)
Eosinophils Relative: 1 % (ref 0–5)
Lymphs Abs: 1.6 10*3/uL (ref 0.7–4.0)
MCH: 24.2 pg — ABNORMAL LOW (ref 26.0–34.0)
MCV: 73.4 fL — ABNORMAL LOW (ref 78.0–100.0)
Platelets: 199 10*3/uL (ref 150–400)
RBC: 4.47 MIL/uL (ref 4.22–5.81)

## 2012-06-14 LAB — LACTIC ACID, PLASMA: Lactic Acid, Venous: 0.8 mmol/L (ref 0.5–2.2)

## 2012-06-14 MED ORDER — DEXTROSE 5 % IV SOLN
2.0000 g | Freq: Once | INTRAVENOUS | Status: AC
  Administered 2012-06-15: 2 g via INTRAVENOUS
  Filled 2012-06-14: qty 2

## 2012-06-14 MED ORDER — LEVOFLOXACIN IN D5W 750 MG/150ML IV SOLN: 750.0000 mg | INTRAVENOUS | Status: DC

## 2012-06-14 MED ORDER — SODIUM CHLORIDE 0.9 % IV SOLN: 1000.0000 mL | Freq: Once | INTRAVENOUS | Status: DC

## 2012-06-14 MED ORDER — LEVOFLOXACIN IN D5W 750 MG/150ML IV SOLN
750.0000 mg | Freq: Once | INTRAVENOUS | Status: AC
  Administered 2012-06-14: 750 mg via INTRAVENOUS
  Filled 2012-06-14: qty 150

## 2012-06-14 MED ORDER — SODIUM CHLORIDE 0.9 % IV SOLN: 1000.0000 mL | INTRAVENOUS | Status: DC

## 2012-06-14 MED ORDER — DEXTROSE 5 % IV SOLN
1.0000 g | Freq: Three times a day (TID) | INTRAVENOUS | Status: DC
  Administered 2012-06-15 – 2012-06-17 (×7): 1 g via INTRAVENOUS
  Filled 2012-06-14 (×8): qty 1

## 2012-06-14 MED ORDER — AZITHROMYCIN 250 MG PO TABS: 250.0000 mg | ORAL_TABLET | Freq: Every day | ORAL | Status: DC

## 2012-06-14 MED ORDER — CIPROFLOXACIN HCL 500 MG PO TABS
500.0000 mg | ORAL_TABLET | Freq: Once | ORAL | Status: AC
  Administered 2012-06-14: 500 mg via ORAL
  Filled 2012-06-14: qty 1

## 2012-06-14 MED ORDER — CIPROFLOXACIN IN D5W 400 MG/200ML IV SOLN: 400.0000 mg | Freq: Once | INTRAVENOUS | Status: DC

## 2012-06-14 MED ORDER — LEVOFLOXACIN IN D5W 750 MG/150ML IV SOLN
750.0000 mg | Freq: Every day | INTRAVENOUS | Status: DC
  Administered 2012-06-15 – 2012-06-16 (×2): 750 mg via INTRAVENOUS
  Filled 2012-06-14 (×2): qty 150

## 2012-06-14 MED ORDER — CIPROFLOXACIN HCL 500 MG PO TABS: 500.0000 mg | ORAL_TABLET | Freq: Two times a day (BID) | ORAL | Status: DC

## 2012-06-14 MED ORDER — PREDNISONE 20 MG PO TABS: 60.0000 mg | ORAL_TABLET | Freq: Every day | ORAL | Status: DC

## 2012-06-14 MED ORDER — SODIUM CHLORIDE 0.9 % IV SOLN
1000.0000 mL | Freq: Once | INTRAVENOUS | Status: AC
  Administered 2012-06-14: 1000 mL via INTRAVENOUS

## 2012-06-14 NOTE — ED Notes (Signed)
[id number]<AV> Expected date:<BR> Expected time:<BR> Means of arrival:<BR> Comments:<BR> EMS/57 yo male from SNF with temp 101

## 2012-06-14 NOTE — ED Provider Notes (Addendum)
History    CSN: [id number] Arrival date & time 06/14/12  2103 First MD Initiated Contact with Patient 06/14/12 2144      Chief Complaint  Patient presents with  . Urinary Tract Infection  . Fever   Level 5: pt minimally verbal HPI Patient presents emergency room with fever, vomiting and diarrhea. Patient is a resident of a nursing facility. He has a history of quadriplegia and is minimally verbal.   The symptoms started three days ago.  He was seen in the ED yesterday and was treated for a uti.  Family states he continues to have a large amount of diarrhea.  He continues to spike fevers.  They were concerned and brought him in for evaluation.  He is a resident of a nursing facility.  Past Medical History  Diagnosis Date  . Quadriplegia   . Depression   . Anemia   . Parkinson's disease   . Seizures   . Adrenal insufficiency     Past Surgical History  Procedure Laterality Date  . Peg tube placement    . Peripherally inserted central catheter insertion      No family history on file.  History  Substance Use Topics  . Smoking status: Never Smoker   . Smokeless tobacco: Never Used  . Alcohol Use: No      Review of Systems  All other systems reviewed and are negative.    Allergies  Aspirin; Nsaids; and Penicillins  Home Medications   Current Outpatient Rx  Name  Route  Sig  Dispense  Refill  . acetaminophen (TYLENOL) 650 MG suppository   Rectal   Place 650 mg rectally every 4 (four) hours as needed for pain.         Marland Kitchen azithromycin (ZITHROMAX) 500 MG tablet   Tube   Give 500 mg by tube every morning.          . baclofen (LIORESAL) 10 MG tablet   Tube   Give 10 mg by tube 3 (three) times daily.         . carbidopa-levodopa (SINEMET) 25-100 MG per tablet   PEG Tube   1 tablet by PEG Tube route 3 (three) times daily.          . cetirizine (ZYRTEC) 5 MG tablet   PEG Tube   5 mg by PEG Tube route every morning.          . docusate (COLACE) 50  MG/5ML liquid   Tube   Give 50 mg by tube every 12 (twelve) hours.         Marland Kitchen esomeprazole (NEXIUM) 20 MG packet   PEG Tube   20 mg by PEG Tube route every morning.          . ferrous sulfate 220 (44 FE) MG/5ML solution   PEG Tube   330 mg by PEG Tube route every morning.          . fludrocortisone (FLORINEF) 0.1 MG tablet   PEG Tube   0.1 mg by PEG Tube route every evening.          . folic acid (FOLVITE) 1 MG tablet   PEG Tube   1 mg by PEG Tube route every morning.          Marland Kitchen ipratropium-albuterol (DUONEB) 0.5-2.5 (3) MG/3ML SOLN   Nebulization   Take 3 mLs by nebulization every 6 (six) hours as needed (for shortness of breath). For shortness of breath         .  Nutritional Supplements (TWOCAL HN) LIQD   Tube   Give 1 Can by tube continuous. Run@ 55cc/hr for 20 hrs. Turn off pump 1 hour before and keep off until 1 hour after phenytoin dose         . olopatadine (PATANOL) 0.1 % ophthalmic solution   Both Eyes   Place 1 drop into both eyes every morning.          . phenytoin (DILANTIN) 100 MG/4ML suspension   PEG Tube   100 mg by PEG Tube route 2 (two) times daily.          . polyethylene glycol (MIRALAX / GLYCOLAX) packet   PEG Tube   17 g by PEG Tube route 2 (two) times daily.          . polyvinyl alcohol (LIQUIFILM TEARS) 1.4 % ophthalmic solution   Both Eyes   Place 1 drop into both eyes daily.          . predniSONE (DELTASONE) 10 MG tablet   Tube   Give 10 mg by tube every morning.         . saccharomyces boulardii (FLORASTOR) 250 MG capsule   Tube   Give 250 mg by tube 2 (two) times daily.          . ciprofloxacin (CIPRO) 500 MG tablet   Tube   Give 500 mg by tube 2 (two) times daily.         . predniSONE (DELTASONE) 20 MG tablet   Oral   Take 60 mg by mouth daily.           BP 108/65  Pulse 90  Temp(Src) 101.7 F (38.7 C) (Rectal)  Resp 26  SpO2 94%  Physical Exam  Nursing note and vitals  reviewed. Constitutional: No distress.  Thin, flexion contractures of his upper extremity  HENT:  Head: Normocephalic and atraumatic.  Right Ear: External ear normal.  Left Ear: External ear normal.  Mouth/Throat: No oropharyngeal exudate.  Eyes: Conjunctivae are normal. Right eye exhibits no discharge. Left eye exhibits no discharge. No scleral icterus.  Neck: Neck supple. No tracheal deviation present.  Cardiovascular: Normal rate, regular rhythm and intact distal pulses.   Pulmonary/Chest: Effort normal. No stridor. No respiratory distress. He has no wheezes. He has no rales.  Few rhonchi  Abdominal: Soft. Bowel sounds are normal. He exhibits no distension. There is no tenderness. There is no rebound and no guarding.  Musculoskeletal: He exhibits no edema and no tenderness.  Neurological: Cranial nerve deficit:  no gross defecits noted. He exhibits abnormal muscle tone. He displays no seizure activity.  Patient eyes are open, he seems to respond to verbal stimuli, does not answer questions or follow commands   Skin: Skin is warm and dry. No rash noted. He is not diaphoretic.  Psychiatric: He has a normal mood and affect.    ED Course  Procedures (including critical care time) EKG Normal sinus rhythm, rate 86 Low voltage in frontal leads Borderline T-wave abnormalities in the anterior and lateral leads No significant change when compared to prior EKG  Medications  0.9 %  sodium chloride infusion (1,000 mLs Intravenous New Bag/Given 06/14/12 2336)    Followed by  0.9 %  sodium chloride infusion (not administered)    Followed by  0.9 %  sodium chloride infusion (not administered)  levofloxacin (LEVAQUIN) IVPB 750 mg (750 mg Intravenous New Bag/Given 06/14/12 2336)  aztreonam (AZACTAM) 2 g in dextrose 5 % 50 mL  IVPB (not administered)  aztreonam (AZACTAM) 1 g in dextrose 5 % 50 mL IVPB (not administered)  levofloxacin (LEVAQUIN) IVPB 750 mg (not administered)    Labs Reviewed   COMPREHENSIVE METABOLIC PANEL - Abnormal; Notable for the following:    Sodium 132 (*)    Creatinine, Ser 0.49 (*)    Albumin 3.1 (*)    AST 40 (*)    Alkaline Phosphatase 123 (*)    All other components within normal limits  CBC WITH DIFFERENTIAL - Abnormal; Notable for the following:    WBC 13.2 (*)    Hemoglobin 10.8 (*)    HCT 32.8 (*)    MCV 73.4 (*)    MCH 24.2 (*)    Neutrophils Relative 78 (*)    Neutro Abs 10.4 (*)    Monocytes Absolute 1.2 (*)    All other components within normal limits  CULTURE, BLOOD (ROUTINE X 2)  CULTURE, BLOOD (ROUTINE X 2)  URINE CULTURE  LACTIC ACID, PLASMA  URINALYSIS, ROUTINE W REFLEX MICROSCOPIC   Dg Chest 2 View  06/14/2012  *RADIOLOGY REPORT*  Clinical Data: Fever.  History of quadriplegia.  CHEST - 2 VIEW  Comparison: Two-view chest x-ray and CT chest 10/28/2011.  Findings: Cardiac silhouette normal in size, unchanged.  Thoracic aorta mildly atherosclerotic and tortuous, unchanged.  Hilar and mediastinal contours otherwise unremarkable.  Prominent bronchovascular markings diffusely and mild central peribronchial thickening, more so than on the prior examinations.  No localized airspace consolidation.  No pleural effusions.  Thoracic scoliosis convex left.  IMPRESSION: Moderate changes of acute bronchitis and/or asthma without localized airspace pneumonia.   Original Report Authenticated By: Hulan Saas, M.D.    Dg Chest Portable 1 View  06/14/2012  *RADIOLOGY REPORT*  Clinical Data: Fever.  PORTABLE CHEST - 1 VIEW  Comparison: 06/13/2012  Findings: Single view of the chest demonstrates prominent interstitial markings and some peribronchial thickening at the right lung base.  These findings appear chronic.  There may also be some right basilar atelectasis.  No focal airspace disease.  Heart and mediastinum are stable.  IMPRESSION: Chronic lung changes without acute findings.   Original Report Authenticated By: Richarda Overlie, M.D.      1. UTI  (urinary tract infection)   2. Fever       MDM  Pt continues to have a fever despite oral abx.  Considering his persistent symptoms, will admit for IV abx.  Source is likely UTI.    Abdomen exam benign.  NO pna on CXR.  Urine and blood cultures sent.    With his history of adrenal insufficiency will give a dose of stress dose steroids.     Celene Kras, MD 06/15/12 [phone]

## 2012-06-14 NOTE — Progress Notes (Signed)
ANTIBIOTIC CONSULT NOTE - INITIAL  Pharmacy Consult for Levaquin and Azactam  Indication: UTI and fever; failed OP cipro and zpack  Allergies  Allergen Reactions  . Aspirin     unk reaction  . Nsaids     unk reaction  . Penicillins     unk reaction    Patient Measurements:     Vital Signs: Temp: 101.7 F (38.7 C) (03/20 2111) Temp src: Rectal (03/20 2111) BP: 108/65 mmHg (03/20 2111) Pulse Rate: 90 (03/20 2111) Intake/Output from previous day:   Intake/Output from this shift:    Labs:  Recent Labs  06/13/12 2242  WBC 10.1  HGB 11.8*  PLT 194  CREATININE 0.60   The CrCl is unknown because both a height and weight (above a minimum accepted value) are required for this calculation. No results found for this basename: VANCOTROUGH, VANCOPEAK, VANCORANDOM, GENTTROUGH, GENTPEAK, GENTRANDOM, TOBRATROUGH, TOBRAPEAK, TOBRARND, AMIKACINPEAK, AMIKACINTROU, AMIKACIN,  in the last 72 hours   Microbiology: No results found for this or any previous visit (from the past 720 hour(s)).  Medical History: Past Medical History  Diagnosis Date  . Quadriplegia   . Depression   . Anemia   . Parkinson's disease   . Seizures   . Adrenal insufficiency     Medications:  Anti-infectives   Start     Dose/Rate Route Frequency Ordered Stop   06/14/12 2215  levofloxacin (LEVAQUIN) IVPB 750 mg     750 mg 100 mL/hr over 90 Minutes Intravenous  Once 06/14/12 2211     06/14/12 2215  aztreonam (AZACTAM) 2 g in dextrose 5 % 50 mL IVPB     2 g 100 mL/hr over 30 Minutes Intravenous  Once 06/14/12 2211       Assessment: 56 YO quadriplegic M admitted from [facility] 3/20 w/ fever. Was rx'd w/ Cipro and azithromycin OP today PTA. Azith dose today but Cipro had not been started yet. Scr wnl. Urine and blood cx ordered  Goal of Therapy:  Appropriate doses of abx  Plan:   Levaquin 750mg  IV q24h  Azactam 1g IV q8h  Follow labs vitals and cx  Adjust doses as necessary  Gwen Her  PharmD  [phone] 06/14/2012 10:27 PM

## 2012-06-14 NOTE — ED Notes (Signed)
Pt report given via EMS. Pt was seen yesterday at [facility] diagnosed UTI. Pt had fever of 101.68F at 2000. Pt given 650 Tylenol G tube at 2000. Pt quadriplegic. Pt given script for zpack yesterday and cipro today. Family still requested pt to be seen. Pt from [facility]. VS 104/60 HR 92 SpO2 95% RA at 2055.

## 2012-06-15 ENCOUNTER — Encounter (HOSPITAL_COMMUNITY): Payer: Self-pay | Admitting: Internal Medicine

## 2012-06-15 ENCOUNTER — Inpatient Hospital Stay (HOSPITAL_COMMUNITY): Payer: Medicare Other

## 2012-06-15 DIAGNOSIS — R112 Nausea with vomiting, unspecified: Secondary | ICD-10-CM

## 2012-06-15 DIAGNOSIS — N390 Urinary tract infection, site not specified: Secondary | ICD-10-CM

## 2012-06-15 DIAGNOSIS — R197 Diarrhea, unspecified: Secondary | ICD-10-CM

## 2012-06-15 DIAGNOSIS — R509 Fever, unspecified: Secondary | ICD-10-CM | POA: Diagnosis present

## 2012-06-15 DIAGNOSIS — E2749 Other adrenocortical insufficiency: Secondary | ICD-10-CM

## 2012-06-15 LAB — COMPREHENSIVE METABOLIC PANEL
ALT: 23 U/L (ref 0–53)
AST: 25 U/L (ref 0–37)
Albumin: 2.8 g/dL — ABNORMAL LOW (ref 3.5–5.2)
Alkaline Phosphatase: 105 U/L (ref 39–117)
Glucose, Bld: 107 mg/dL — ABNORMAL HIGH (ref 70–99)
Potassium: 3.5 mEq/L (ref 3.5–5.1)
Sodium: 133 mEq/L — ABNORMAL LOW (ref 135–145)
Total Protein: 7.1 g/dL (ref 6.0–8.3)

## 2012-06-15 LAB — CBC WITH DIFFERENTIAL/PLATELET
Basophils Absolute: 0 10*3/uL (ref 0.0–0.1)
Basophils Relative: 0 % (ref 0–1)
Eosinophils Relative: 1 % (ref 0–5)
HCT: 29.5 % — ABNORMAL LOW (ref 39.0–52.0)
Hemoglobin: 9.6 g/dL — ABNORMAL LOW (ref 13.0–17.0)
MCH: 24.1 pg — ABNORMAL LOW (ref 26.0–34.0)
MCHC: 32.5 g/dL (ref 30.0–36.0)
MCV: 73.9 fL — ABNORMAL LOW (ref 78.0–100.0)
Monocytes Absolute: 0.9 10*3/uL (ref 0.1–1.0)
Monocytes Relative: 10 % (ref 3–12)
RDW: 13.4 % (ref 11.5–15.5)

## 2012-06-15 LAB — PHENYTOIN LEVEL, TOTAL: Phenytoin Lvl: 9.1 ug/mL — ABNORMAL LOW (ref 10.0–20.0)

## 2012-06-15 LAB — URINALYSIS, ROUTINE W REFLEX MICROSCOPIC
Bilirubin Urine: NEGATIVE
Nitrite: POSITIVE — AB
Specific Gravity, Urine: 1.035 — ABNORMAL HIGH (ref 1.005–1.030)
pH: 5.5 (ref 5.0–8.0)

## 2012-06-15 LAB — URINE MICROSCOPIC-ADD ON

## 2012-06-15 MED ORDER — SACCHAROMYCES BOULARDII 250 MG PO CAPS
250.0000 mg | ORAL_CAPSULE | Freq: Two times a day (BID) | ORAL | Status: DC
Start: 2012-06-15 — End: 2012-06-18
  Administered 2012-06-15 – 2012-06-18 (×7): 250 mg via ORAL
  Filled 2012-06-15 (×9): qty 1

## 2012-06-15 MED ORDER — VANCOMYCIN HCL 1000 MG IV SOLR
750.0000 mg | Freq: Three times a day (TID) | INTRAVENOUS | Status: DC
  Administered 2012-06-15 – 2012-06-17 (×6): 750 mg via INTRAVENOUS
  Filled 2012-06-15 (×7): qty 750

## 2012-06-15 MED ORDER — ONDANSETRON HCL 4 MG PO TABS: 4.0000 mg | ORAL_TABLET | Freq: Four times a day (QID) | ORAL | Status: DC | PRN

## 2012-06-15 MED ORDER — PREDNISONE 10 MG PO TABS
10.0000 mg | ORAL_TABLET | Freq: Every morning | ORAL | Status: DC
  Administered 2012-06-15 – 2012-06-18 (×4): 10 mg via ORAL
  Filled 2012-06-15 (×4): qty 1

## 2012-06-15 MED ORDER — HYDROCORTISONE SOD SUCCINATE 100 MG IJ SOLR
50.0000 mg | Freq: Four times a day (QID) | INTRAMUSCULAR | Status: AC
  Administered 2012-06-15 (×2): 50 mg via INTRAVENOUS
  Filled 2012-06-15 (×5): qty 1

## 2012-06-15 MED ORDER — FERROUS SULFATE 300 (60 FE) MG/5ML PO SYRP
300.0000 mg | ORAL_SOLUTION | Freq: Every morning | ORAL | Status: DC
  Administered 2012-06-15 – 2012-06-18 (×4): 300 mg
  Filled 2012-06-15 (×4): qty 5

## 2012-06-15 MED ORDER — BACLOFEN 10 MG PO TABS
10.0000 mg | ORAL_TABLET | Freq: Three times a day (TID) | ORAL | Status: DC
  Administered 2012-06-15 – 2012-06-18 (×10): 10 mg via ORAL
  Filled 2012-06-15 (×13): qty 1

## 2012-06-15 MED ORDER — PHENYTOIN 125 MG/5ML PO SUSP
100.0000 mg | Freq: Two times a day (BID) | ORAL | Status: DC
  Administered 2012-06-15 – 2012-06-18 (×7): 100 mg via ORAL
  Filled 2012-06-15 (×9): qty 4

## 2012-06-15 MED ORDER — IPRATROPIUM-ALBUTEROL 0.5-2.5 (3) MG/3ML IN SOLN: 3.0000 mL | Freq: Four times a day (QID) | RESPIRATORY_TRACT | Status: DC | PRN

## 2012-06-15 MED ORDER — ENOXAPARIN SODIUM 40 MG/0.4ML ~~LOC~~ SOLN
40.0000 mg | SUBCUTANEOUS | Status: DC
  Administered 2012-06-15 – 2012-06-18 (×4): 40 mg via SUBCUTANEOUS
  Filled 2012-06-15 (×4): qty 0.4

## 2012-06-15 MED ORDER — LORATADINE 10 MG PO TABS
10.0000 mg | ORAL_TABLET | Freq: Every day | ORAL | Status: DC
  Administered 2012-06-15 – 2012-06-18 (×4): 10 mg via ORAL
  Filled 2012-06-15 (×4): qty 1

## 2012-06-15 MED ORDER — PANTOPRAZOLE SODIUM 40 MG PO PACK
40.0000 mg | PACK | Freq: Every day | ORAL | Status: DC
  Administered 2012-06-15 – 2012-06-18 (×4): 40 mg
  Filled 2012-06-15 (×5): qty 20

## 2012-06-15 MED ORDER — OLOPATADINE HCL 0.1 % OP SOLN
1.0000 [drp] | Freq: Every morning | OPHTHALMIC | Status: DC
  Administered 2012-06-15 – 2012-06-17 (×3): 1 [drp] via OPHTHALMIC
  Filled 2012-06-15: qty 5

## 2012-06-15 MED ORDER — IPRATROPIUM BROMIDE 0.02 % IN SOLN: 0.5000 mg | Freq: Four times a day (QID) | RESPIRATORY_TRACT | Status: DC | PRN

## 2012-06-15 MED ORDER — ESOMEPRAZOLE MAGNESIUM 20 MG PO PACK: 20.0000 mg | PACK | Freq: Every morning | ORAL | Status: DC

## 2012-06-15 MED ORDER — FOLIC ACID 1 MG PO TABS
1.0000 mg | ORAL_TABLET | Freq: Every morning | ORAL | Status: DC
  Administered 2012-06-15 – 2012-06-18 (×4): 1 mg via ORAL
  Filled 2012-06-15 (×4): qty 1

## 2012-06-15 MED ORDER — ALBUTEROL SULFATE (5 MG/ML) 0.5% IN NEBU: 2.5000 mg | INHALATION_SOLUTION | Freq: Four times a day (QID) | RESPIRATORY_TRACT | Status: DC | PRN

## 2012-06-15 MED ORDER — OSMOLITE 1.5 CAL PO LIQD
1000.0000 mL | ORAL | Status: DC
  Administered 2012-06-15 – 2012-06-16 (×2): 1000 mL
  Filled 2012-06-15 (×5): qty 1000

## 2012-06-15 MED ORDER — VANCOMYCIN HCL IN DEXTROSE 1-5 GM/200ML-% IV SOLN
1000.0000 mg | Freq: Once | INTRAVENOUS | Status: AC
  Administered 2012-06-15: 1000 mg via INTRAVENOUS
  Filled 2012-06-15: qty 200

## 2012-06-15 MED ORDER — IOHEXOL 300 MG/ML  SOLN
100.0000 mL | Freq: Once | INTRAMUSCULAR | Status: AC | PRN
  Administered 2012-06-15: 100 mL via INTRAVENOUS

## 2012-06-15 MED ORDER — SODIUM CHLORIDE 0.9 % IV SOLN
INTRAVENOUS | Status: AC
  Administered 2012-06-15 (×3): via INTRAVENOUS

## 2012-06-15 MED ORDER — SODIUM CHLORIDE 0.9 % IJ SOLN: 3.0000 mL | Freq: Two times a day (BID) | INTRAMUSCULAR | Status: DC

## 2012-06-15 MED ORDER — JEVITY 1.2 CAL PO LIQD
1000.0000 mL | ORAL | Status: DC
  Filled 2012-06-15: qty 1000

## 2012-06-15 MED ORDER — HYDROCORTISONE SOD SUCCINATE 100 MG IJ SOLR
100.0000 mg | Freq: Once | INTRAMUSCULAR | Status: AC
  Administered 2012-06-15: 100 mg via INTRAVENOUS

## 2012-06-15 MED ORDER — ACETAMINOPHEN 650 MG RE SUPP: 650.0000 mg | Freq: Four times a day (QID) | RECTAL | Status: DC | PRN

## 2012-06-15 MED ORDER — CARBIDOPA-LEVODOPA 25-100 MG PO TABS
1.0000 | ORAL_TABLET | Freq: Three times a day (TID) | ORAL | Status: DC
  Administered 2012-06-15 – 2012-06-18 (×10): 1 via ORAL
  Filled 2012-06-15 (×13): qty 1

## 2012-06-15 MED ORDER — FLUDROCORTISONE ACETATE 0.1 MG PO TABS
0.1000 mg | ORAL_TABLET | Freq: Every evening | ORAL | Status: DC
  Administered 2012-06-15 – 2012-06-17 (×3): 0.1 mg via ORAL
  Filled 2012-06-15 (×4): qty 1

## 2012-06-15 MED ORDER — ONDANSETRON HCL 4 MG/2ML IJ SOLN: 4.0000 mg | Freq: Four times a day (QID) | INTRAMUSCULAR | Status: DC | PRN

## 2012-06-15 MED ORDER — IOHEXOL 300 MG/ML  SOLN
50.0000 mL | Freq: Once | INTRAMUSCULAR | Status: AC | PRN
  Administered 2012-06-15: 50 mL via ORAL

## 2012-06-15 MED ORDER — ACETAMINOPHEN 325 MG PO TABS: 650.0000 mg | ORAL_TABLET | Freq: Four times a day (QID) | ORAL | Status: DC | PRN

## 2012-06-15 NOTE — H&P (Signed)
[facility] History and Physical  Vic Flowers ZOX:[id number] DOB: 11/10/1955 DOA: 06/14/2012  Referring physician: Dr. Lynelle Doctor. PCP: [name], MD  Specialists: None.  Chief Complaint: Nausea vomiting and diarrhea with fever.  HPI: [name] is a 57 y.o. male with history of quadriplegia on PEG tube feeds, seizure disorder, Parkinson's disease and Addison's disease was brought from the nursing home because of persistent nausea vomiting and diarrhea. Patient has been having these symptoms for last 3 days had originally come to the ER 3 days ago and was discharged on antibiotics. Despite taking which patient still has persistent symptoms. Patient's UA done 3 days ago showing features consistent with UTI and the present one done today shows same. Chest x-ray does not show any definite infiltrates the patient's mother states that he has been having productive cough for last 3 days. Patient at this time as been admitted for further management including IV antibiotics. Patient has nausea vomiting and diarrhea but denies any abdominal pain. Denies any chest pain or shortness of breath.  Review of Systems: As presented in the history of presenting illness, rest negative.  Past Medical History  Diagnosis Date  . Quadriplegia   . Depression   . Anemia   . Parkinson's disease   . Seizures   . Adrenal insufficiency    Past Surgical History  Procedure Laterality Date  . Peg tube placement    . Peripherally inserted central catheter insertion     Social History:  reports that he has never smoked. He has never used smokeless tobacco. He reports that he does not drink alcohol or use illicit drugs. Lives at nursing home. where does patient live--  Can patient participate in ADLs?  Allergies  Allergen Reactions  . Aspirin     unk reaction  . Nsaids     unk reaction  . Penicillins     unk reaction    History reviewed. No pertinent family history.    Prior to  Admission medications   Medication Sig Start Date End Date Taking? Authorizing Provider  acetaminophen (TYLENOL) 650 MG suppository Place 650 mg rectally every 4 (four) hours as needed for pain.   Yes Historical Provider, MD  azithromycin (ZITHROMAX) 500 MG tablet Give 500 mg by tube every morning.  06/13/12 06/17/12 Yes Historical Provider, MD  baclofen (LIORESAL) 10 MG tablet Give 10 mg by tube 3 (three) times daily.   Yes Historical Provider, MD  carbidopa-levodopa (SINEMET) 25-100 MG per tablet 1 tablet by PEG Tube route 3 (three) times daily.    Yes Historical Provider, MD  cetirizine (ZYRTEC) 5 MG tablet 5 mg by PEG Tube route every morning.    Yes Historical Provider, MD  docusate (COLACE) 50 MG/5ML liquid Give 50 mg by tube every 12 (twelve) hours.   Yes Historical Provider, MD  esomeprazole (NEXIUM) 20 MG packet 20 mg by PEG Tube route every morning.    Yes Historical Provider, MD  ferrous sulfate 220 (44 FE) MG/5ML solution 330 mg by PEG Tube route every morning.    Yes Historical Provider, MD  fludrocortisone (FLORINEF) 0.1 MG tablet 0.1 mg by PEG Tube route every evening.    Yes Historical Provider, MD  folic acid (FOLVITE) 1 MG tablet 1 mg by PEG Tube route every morning.    Yes Historical Provider, MD  ipratropium-albuterol (DUONEB) 0.5-2.5 (3) MG/3ML SOLN Take 3 mLs by nebulization every 6 (six) hours as needed (for shortness of breath). For shortness of breath   Yes Historical  Provider, MD  Nutritional Supplements (TWOCAL HN) LIQD Give 1 Can by tube continuous. Run@ 55cc/hr for 20 hrs. Turn off pump 1 hour before and keep off until 1 hour after phenytoin dose   Yes Historical Provider, MD  olopatadine (PATANOL) 0.1 % ophthalmic solution Place 1 drop into both eyes every morning.    Yes Historical Provider, MD  phenytoin (DILANTIN) 100 MG/4ML suspension 100 mg by PEG Tube route 2 (two) times daily.    Yes Historical Provider, MD  polyethylene glycol (MIRALAX / GLYCOLAX) packet 17 g by  PEG Tube route 2 (two) times daily.    Yes Historical Provider, MD  polyvinyl alcohol (LIQUIFILM TEARS) 1.4 % ophthalmic solution Place 1 drop into both eyes daily.    Yes Historical Provider, MD  predniSONE (DELTASONE) 10 MG tablet Give 10 mg by tube every morning.   Yes Historical Provider, MD  saccharomyces boulardii (FLORASTOR) 250 MG capsule Give 250 mg by tube 2 (two) times daily.  06/12/12 06/16/12 Yes Historical Provider, MD  ciprofloxacin (CIPRO) 500 MG tablet Give 500 mg by tube 2 (two) times daily. 06/14/12 06/21/12  Gerhard Munch, MD  predniSONE (DELTASONE) 20 MG tablet Take 60 mg by mouth daily. 06/14/12 06/19/12  Gerhard Munch, MD   Physical Exam: Filed Vitals:   06/14/12 2111  BP: 108/65  Pulse: 90  Temp: 101.7 F (38.7 C)  TempSrc: Rectal  Resp: 26  SpO2: 94%     General:  Well-developed moderately nourished.  Eyes: Anicteric no pallor.  ENT: No discharge from the ears eyes nose and mouth.  Neck: Normal JVD or mass felt.  Cardiovascular: S1-S2 heard.  Respiratory: No rhonchi no crepitations.  Abdomen: Soft bowel sounds present. PEG tube seen.  Skin: No rash.  Musculoskeletal: No edema.  Psychiatric: Alert awake.  Neurologic: Quadriplegic.  Labs on Admission:  Basic Metabolic Panel:  Recent Labs Lab 06/13/12 2242 06/14/12 2130  NA 135 132*  K 3.6 3.8  CL 97 96  CO2 27 26  GLUCOSE 92 87  BUN 17 15  CREATININE 0.60 0.49*  CALCIUM 9.0 8.5   Liver Function Tests:  Recent Labs Lab 06/13/12 2242 06/14/12 2130  AST 43* 40*  ALT 8 27  ALKPHOS 141* 123*  BILITOT 0.3 0.3  PROT 8.6* 8.3  ALBUMIN 3.6 3.1*   No results found for this basename: LIPASE, AMYLASE,  in the last 168 hours No results found for this basename: AMMONIA,  in the last 168 hours CBC:  Recent Labs Lab 06/13/12 2242 06/14/12 2130  WBC 10.1 13.2*  NEUTROABS 7.5 10.4*  HGB 11.8* 10.8*  HCT 35.3* 32.8*  MCV 73.1* 73.4*  PLT 194 199   Cardiac Enzymes: No results  found for this basename: CKTOTAL, CKMB, CKMBINDEX, TROPONINI,  in the last 168 hours  BNP (last 3 results) No results found for this basename: PROBNP,  in the last 8760 hours CBG: No results found for this basename: GLUCAP,  in the last 168 hours  Radiological Exams on Admission: Dg Chest 2 View  06/14/2012  *RADIOLOGY REPORT*  Clinical Data: Fever.  History of quadriplegia.  CHEST - 2 VIEW  Comparison: Two-view chest x-ray and CT chest 10/28/2011.  Findings: Cardiac silhouette normal in size, unchanged.  Thoracic aorta mildly atherosclerotic and tortuous, unchanged.  Hilar and mediastinal contours otherwise unremarkable.  Prominent bronchovascular markings diffusely and mild central peribronchial thickening, more so than on the prior examinations.  No localized airspace consolidation.  No pleural effusions.  Thoracic scoliosis convex  left.  IMPRESSION: Moderate changes of acute bronchitis and/or asthma without localized airspace pneumonia.   Original Report Authenticated By: Hulan Saas, M.D.    Dg Chest Portable 1 View  06/14/2012  *RADIOLOGY REPORT*  Clinical Data: Fever.  PORTABLE CHEST - 1 VIEW  Comparison: 06/13/2012  Findings: Single view of the chest demonstrates prominent interstitial markings and some peribronchial thickening at the right lung base.  These findings appear chronic.  There may also be some right basilar atelectasis.  No focal airspace disease.  Heart and mediastinum are stable.  IMPRESSION: Chronic lung changes without acute findings.   Original Report Authenticated By: Richarda Overlie, M.D.      Assessment/Plan Principal Problem:   Nausea vomiting and diarrhea Active Problems:   Adrenal insufficiency   Fever   UTI (lower urinary tract infection)   1. Nausea vomiting and diarrhea with fever - could be from gastroenteritis and also from UTI. Since patient's symptoms is persistent CT abdomen and pelvis has been ordered. Patient at this time has been started on empiric  antibiotics after blood cultures and urine cultures obtained. Chest x-rays don't reveal any infiltrates though patient's mother does say that patient has productive cough. 2. Mild hyponatremia and dehydration - continue with IV fluids. 3. History of adrenal insufficiency - patient at this time has been placed on stress dose steroids for 2 more dose. Continue with PEG tube prednisone. 4. History of Parkinson's disease -continue present medications. 5. Seizures - have asked pharmacy to monitor the dose and levels. 6. History of quadriplegia.     Code Status: Full code.  Family Communication: Patient's mother at bedside.  Disposition Plan: Admit to inpatient.    Emmabelle Fear N. [facility] Pager [phone].  If 7PM-7AM, please contact night-coverage [email or web address] Password [facility] 06/15/2012, 1:55 AM

## 2012-06-15 NOTE — Progress Notes (Signed)
   CARE MANAGEMENT NOTE 06/15/2012  Patient:  [name], [name]   Account Number:  [id number]  Date Initiated:  06/15/2012  Documentation initiated by:  Jiles Crocker  Subjective/Objective Assessment:   ADMITTED WITH UTI, FEVER     Action/Plan:   PATIENT RESIDES IN A NURSING FACILITY; SOC WORKER REFERRAL PLACED   Anticipated DC Date:  06/22/2012   Anticipated DC Plan:  SKILLED NURSING FACILITY  In-house referral  Clinical Social Worker      DC Planning Services  CM consult         Status of service:  In process, will continue to follow Medicare Important Message given?  NA - LOS <3 / Initial given by admissions (If response is "NO", the following Medicare IM given date fields will be blank   Per UR Regulation:  Reviewed for med. necessity/level of care/duration of stay Comments:  06/15/2012- B Sundae Maners RN,BSN,MHA

## 2012-06-15 NOTE — Progress Notes (Signed)
Clinical Social Work Department BRIEF PSYCHOSOCIAL ASSESSMENT 06/15/2012  Patient:  [name], [name]     Account Number:  [id number]     Admit date:  06/14/2012  Clinical Social Worker:  Orpah Greek  Date/Time:  06/15/2012 10:46 AM  Referred by:  Physician  Date Referred:  06/15/2012 Referred for  Other - See comment   Other Referral:   Admitted from: [facility]   Interview type:  Family Other interview type:   patient's mother, [name]    PSYCHOSOCIAL DATA Living Status:  FACILITY Admitted from facility:  [facility] Level of care:  Skilled Nursing Facility Primary support name:  [name] (mother) h#: [phone] c#: [phone] Primary support relationship to patient:  PARENT Degree of support available:   good    CURRENT CONCERNS Current Concerns  Post-Acute Placement   Other Concerns:    SOCIAL WORK ASSESSMENT / PLAN CSW spoke with patient's mother, [name] re: discharge planning. Patient was admitted from [facility] where she plans to have him return at discharge.   Assessment/plan status:  Information/Referral to [facility] Other assessment/ plan:   Information/referral to community resources:   CSW completed FL2 and faxed information to [facility], confirmed with [facility] @ SNF that they will be able to take patient back at discharge.    PATIENT'S/FAMILY'S RESPONSE TO PLAN OF CARE: Patient's mother states that she's been pleased with [name] & plans to have him return at discharge. Patient is a long term patient at [facility].        [name] Bailey, LCSW [facility] Clinical Social Worker cell #: [phone]

## 2012-06-15 NOTE — Progress Notes (Signed)
MEDICATION RELATED CONSULT NOTE - INITIAL   Pharmacy Consult for Phenytoin Indication: Seizures  Allergies  Allergen Reactions  . Aspirin     unk reaction  . Nsaids     unk reaction  . Penicillins     unk reaction    Patient Measurements: Height: 6\' 4"  (193 cm) Weight: 150 lb 2.1 oz (68.1 kg) IBW/kg (Calculated) : 86.8 Adjusted Body Weight:   Vital Signs: Temp: 97.9 F (36.6 C) (03/21 0400) Temp src: Oral (03/21 0400) BP: 105/72 mmHg (03/21 0400) Pulse Rate: 80 (03/21 0400) Intake/Output from previous day: 03/20 0701 - 03/21 0700 In: 600  Out: -  Intake/Output from this shift: Total I/O In: 600 [Other:600] Out: -   Labs:  Recent Labs  06/13/12 2242 06/14/12 2130 06/15/12 0530  WBC 10.1 13.2* 8.5  HGB 11.8* 10.8* 9.6*  HCT 35.3* 32.8* 29.5*  PLT 194 199 161  CREATININE 0.60 0.49* 0.55  ALBUMIN 3.6 3.1* 2.8*  PROT 8.6* 8.3 7.1  AST 43* 40* 25  ALT 8 27 23   ALKPHOS 141* 123* 105  BILITOT 0.3 0.3 0.3   Estimated Creatinine Clearance: 100.5 ml/min (by C-G formula based on Cr of 0.55).   Microbiology: No results found for this or any previous visit (from the past 720 hour(s)).  Medical History: Past Medical History  Diagnosis Date  . Quadriplegia   . Depression   . Anemia   . Parkinson's disease   . Seizures   . Adrenal insufficiency     Medications:  Prescriptions prior to admission  Medication Sig Dispense Refill  . acetaminophen (TYLENOL) 650 MG suppository Place 650 mg rectally every 4 (four) hours as needed for pain.      Marland Kitchen azithromycin (ZITHROMAX) 500 MG tablet Give 500 mg by tube every morning.       . baclofen (LIORESAL) 10 MG tablet Give 10 mg by tube 3 (three) times daily.      . carbidopa-levodopa (SINEMET) 25-100 MG per tablet 1 tablet by PEG Tube route 3 (three) times daily.       . cetirizine (ZYRTEC) 5 MG tablet 5 mg by PEG Tube route every morning.       . docusate (COLACE) 50 MG/5ML liquid Give 50 mg by tube every 12 (twelve)  hours.      Marland Kitchen esomeprazole (NEXIUM) 20 MG packet 20 mg by PEG Tube route every morning.       . ferrous sulfate 220 (44 FE) MG/5ML solution 330 mg by PEG Tube route every morning.       . fludrocortisone (FLORINEF) 0.1 MG tablet 0.1 mg by PEG Tube route every evening.       . folic acid (FOLVITE) 1 MG tablet 1 mg by PEG Tube route every morning.       Marland Kitchen ipratropium-albuterol (DUONEB) 0.5-2.5 (3) MG/3ML SOLN Take 3 mLs by nebulization every 6 (six) hours as needed (for shortness of breath). For shortness of breath      . Nutritional Supplements (TWOCAL HN) LIQD Give 1 Can by tube continuous. Run@ 55cc/hr for 20 hrs. Turn off pump 1 hour before and keep off until 1 hour after phenytoin dose      . olopatadine (PATANOL) 0.1 % ophthalmic solution Place 1 drop into both eyes every morning.       . phenytoin (DILANTIN) 100 MG/4ML suspension 100 mg by PEG Tube route 2 (two) times daily.       . polyethylene glycol (MIRALAX / GLYCOLAX) packet 17  g by PEG Tube route 2 (two) times daily.       . polyvinyl alcohol (LIQUIFILM TEARS) 1.4 % ophthalmic solution Place 1 drop into both eyes daily.       . predniSONE (DELTASONE) 10 MG tablet Give 10 mg by tube every morning.      . saccharomyces boulardii (FLORASTOR) 250 MG capsule Give 250 mg by tube 2 (two) times daily.       . ciprofloxacin (CIPRO) 500 MG tablet Give 500 mg by tube 2 (two) times daily.      . predniSONE (DELTASONE) 20 MG tablet Take 60 mg by mouth daily.       Scheduled:  . [COMPLETED] sodium chloride  1,000 mL Intravenous Once  . aztreonam  1 g Intravenous Q8H  . [COMPLETED] aztreonam  2 g Intravenous Once  . baclofen  10 mg Oral TID  . carbidopa-levodopa  1 tablet Oral TID  . enoxaparin (LOVENOX) injection  40 mg Subcutaneous Q24H  . ferrous sulfate  300 mg Per Tube q morning - 10a  . fludrocortisone  0.1 mg Oral QPM  . folic acid  1 mg Oral q morning - 10a  . [COMPLETED] hydrocortisone sodium succinate  100 mg Intravenous Once  .  hydrocortisone sod succinate (SOLU-CORTEF) injection  50 mg Intravenous Q6H  . [COMPLETED] levofloxacin (LEVAQUIN) IV  750 mg Intravenous Once  . levofloxacin (LEVAQUIN) IV  750 mg Intravenous QHS  . loratadine  10 mg Oral Daily  . olopatadine  1 drop Both Eyes q morning - 10a  . pantoprazole sodium  40 mg Per Tube Daily  . phenytoin  100 mg Oral BID  . predniSONE  10 mg Oral q morning - 10a  . saccharomyces boulardii  250 mg Oral BID  . sodium chloride  3 mL Intravenous Q12H  . [COMPLETED] vancomycin  1,000 mg Intravenous Once  . [DISCONTINUED] sodium chloride  1,000 mL Intravenous Once  . [DISCONTINUED] esomeprazole  20 mg Oral q morning - 10a  . [DISCONTINUED] levofloxacin (LEVAQUIN) IV  750 mg Intravenous Q24H    Assessment: Patient on chronic phenytoin for seizures.  Level is unknown.  Goal of Therapy:  Phenytoin level (total) 10-20  Plan:  Continue PTA dose for now, check level at 0900.  Darlina Guys, Jacquenette Shone Crowford 06/15/2012,6:20 AM

## 2012-06-15 NOTE — Progress Notes (Signed)
Pharmacy - Phenytoin    57 yo M on on chronic phenytoin for seizures.  Home dose = 100 mg suspension via PEG tube BID.    Renal function is WNL, CrCl 100 ml/min.    Phenytoin level on admission = 9.1; Albumin level = 2.8   Corrected phenytoin level = 13.7 is within normal limits.  Goal phenytoin level 10-20.    Plan 1.) Continue current phenytoin dose of 100 mg PO BID via PEG tube  2.) will f/u if levels are needed  Emmanuella Mirante, Loma Messing PharmD Pager #: [phone] 12:36 PM 06/15/2012

## 2012-06-15 NOTE — Progress Notes (Signed)
ANTIBIOTIC CONSULT NOTE - INITIAL  Pharmacy Consult for vancomycin Indication: Suspected infection/UTI  Allergies  Allergen Reactions  . Aspirin     unk reaction  . Nsaids     unk reaction  . Penicillins     unk reaction    Patient Measurements: Height: 6\' 4"  (193 cm) Weight: 150 lb 2.1 oz (68.1 kg) IBW/kg (Calculated) : 86.8 Adjusted Body Weight:   Vital Signs: Temp: 97.9 F (36.6 C) (03/21 0400) Temp src: Oral (03/21 0400) BP: 105/72 mmHg (03/21 0400) Pulse Rate: 80 (03/21 0400) Intake/Output from previous day: 03/20 0701 - 03/21 0700 In: 600  Out: -  Intake/Output from this shift: Total I/O In: 600 [Other:600] Out: -   Labs:  Recent Labs  06/13/12 2242 06/14/12 2130 06/15/12 0530  WBC 10.1 13.2* 8.5  HGB 11.8* 10.8* 9.6*  PLT 194 199 161  CREATININE 0.60 0.49* 0.55   Estimated Creatinine Clearance: 100.5 ml/min (by C-G formula based on Cr of 0.55). No results found for this basename: VANCOTROUGH, VANCOPEAK, VANCORANDOM, GENTTROUGH, GENTPEAK, GENTRANDOM, TOBRATROUGH, TOBRAPEAK, TOBRARND, AMIKACINPEAK, AMIKACINTROU, AMIKACIN,  in the last 72 hours   Microbiology: No results found for this or any previous visit (from the past 720 hour(s)).  Medical History: Past Medical History  Diagnosis Date  . Quadriplegia   . Depression   . Anemia   . Parkinson's disease   . Seizures   . Adrenal insufficiency     Medications:  Anti-infectives   Start     Dose/Rate Route Frequency Ordered Stop   06/15/12 2200  levofloxacin (LEVAQUIN) IVPB 750 mg     750 mg 100 mL/hr over 90 Minutes Intravenous Daily at bedtime 06/14/12 2225     06/15/12 0600  aztreonam (AZACTAM) 1 g in dextrose 5 % 50 mL IVPB     1 g 100 mL/hr over 30 Minutes Intravenous Every 8 hours 06/14/12 2225     06/15/12 0230  vancomycin (VANCOCIN) IVPB 1000 mg/200 mL premix     1,000 mg 200 mL/hr over 60 Minutes Intravenous  Once 06/15/12 0225 06/15/12 0519   06/15/12 0000  levofloxacin  (LEVAQUIN) IVPB 750 mg  Status:  Discontinued     750 mg 100 mL/hr over 90 Minutes Intravenous Every 24 hours 06/14/12 2220 06/14/12 2225   06/14/12 2215  levofloxacin (LEVAQUIN) IVPB 750 mg     750 mg 100 mL/hr over 90 Minutes Intravenous  Once 06/14/12 2211 06/15/12 0218   06/14/12 2215  aztreonam (AZACTAM) 2 g in dextrose 5 % 50 mL IVPB     2 g 100 mL/hr over 30 Minutes Intravenous  Once 06/14/12 2211 06/15/12 0326     Assessment: Patient with Suspected infection an UTI.  Empiric vancomycin ordered.  Goal of Therapy:  Vancomycin trough level 15-20 mcg/ml  Plan:  Measure antibiotic drug levels at steady state Follow up culture results Vancomycin 750mg  iv q8hr  Darlina Guys, Jacquenette Shone Crowford 06/15/2012,6:25 AM

## 2012-06-15 NOTE — Progress Notes (Signed)
INITIAL NUTRITION ASSESSMENT  DOCUMENTATION CODES Per approved criteria  -Underweight   INTERVENTION: Recommend Osmolite 1.5 @ 70 ml/hr for 20 hours per day via PEG. 30 ml Prostat once daily.  At goal rate, tube feeding regimen will provide 2200 kcal, 103 grams of protein, and 1064 ml of H2O. Turn off pump 1 hour before and keep off until 1 hour after phenytoin dose. Recommend 20 ml water flushes q 4 hours.  This TF regimen will meet 100% of estimated calorie needs and 100% of estimated protein needs.  NUTRITION DIAGNOSIS: Inadequate oral intake related to inability to eat as evidenced by pt with chronic PEG tube.   Goal: Pt to meet >/= 90% of their estimated nutrition needs  Monitor:  Wt Labs TF rate and tolerance  Reason for Assessment: Consult  57 y.o. male  Admitting Dx: Nausea vomiting and diarrhea  ASSESSMENT: 57 y.o. male with history of quadriplegia on PEG tube feeds, seizure disorder, Parkinson's disease and Addison's disease was brought from the nursing home because of persistent nausea vomiting and diarrhea. Patient has been having these symptoms for last 3 days had originally come to the ER 3 days ago and was discharged on antibiotics. Despite taking which patient still has persistent symptoms.  Pt able to shake head yes or no to respond to questions.  Pt confirms his normal wt is 150 lbs and his home TF regimen is TwoCal HN @55  ml/hr for 20 hours. Home TF Regimen provides 2200kcal, 92 grams protein, and 770 ml water.  Height: Ht Readings from Last 1 Encounters:  06/15/12 6\' 4"  (1.93 m)    Weight: Wt Readings from Last 1 Encounters:  06/15/12 150 lb 2.1 oz (68.1 kg)    Ideal Body Weight: 202 lbs  % Ideal Body Weight: 74%  Wt Readings from Last 10 Encounters:  06/15/12 150 lb 2.1 oz (68.1 kg)  06/13/12 150 lb (68.04 kg)  11/11/11 151 lb 3.8 oz (68.6 kg)    Usual Body Weight: 150 lb  % Usual Body Weight: 100%  BMI:  Body mass index is 18.28  kg/(m^2).  Estimated Nutritional Needs: Kcal: 1910- 2250 Protein: 102-123 grams Fluid: 1.9-2.2 L  Skin: WDL  Diet Order:    EDUCATION NEEDS: -No education needs identified at this time   Intake/Output Summary (Last 24 hours) at 06/15/12 1150 Last data filed at 06/15/12 1100  Gross per 24 hour  Intake    600 ml  Output   1175 ml  Net   -575 ml    Last BM: 3/20  Labs:   Recent Labs Lab 06/13/12 2242 06/14/12 2130 06/15/12 0530  NA 135 132* 133*  K 3.6 3.8 3.5  CL 97 96 98  CO2 27 26 25   BUN 17 15 13   CREATININE 0.60 0.49* 0.55  CALCIUM 9.0 8.5 8.1*  GLUCOSE 92 87 107*    CBG (last 3)  No results found for this basename: GLUCAP,  in the last 72 hours  Scheduled Meds: . aztreonam  1 g Intravenous Q8H  . baclofen  10 mg Oral TID  . carbidopa-levodopa  1 tablet Oral TID  . enoxaparin (LOVENOX) injection  40 mg Subcutaneous Q24H  . ferrous sulfate  300 mg Per Tube q morning - 10a  . fludrocortisone  0.1 mg Oral QPM  . folic acid  1 mg Oral q morning - 10a  . hydrocortisone sod succinate (SOLU-CORTEF) injection  50 mg Intravenous Q6H  . levofloxacin (LEVAQUIN) IV  750 mg Intravenous  QHS  . loratadine  10 mg Oral Daily  . olopatadine  1 drop Both Eyes q morning - 10a  . pantoprazole sodium  40 mg Per Tube Daily  . phenytoin  100 mg Oral BID  . predniSONE  10 mg Oral q morning - 10a  . saccharomyces boulardii  250 mg Oral BID  . sodium chloride  3 mL Intravenous Q12H  . vancomycin  750 mg Intravenous Q8H    Continuous Infusions: . sodium chloride 125 mL/hr at 06/15/12 1007  . feeding supplement (OSMOLITE 1.5 CAL) 1,000 mL (06/15/12 1007)    Past Medical History  Diagnosis Date  . Quadriplegia   . Depression   . Anemia   . Parkinson's disease   . Seizures   . Adrenal insufficiency     Past Surgical History  Procedure Laterality Date  . Peg tube placement    . Peripherally inserted central catheter insertion      Ian Malkin RD,  LDN Inpatient Clinical Dietitian Pager: [phone] After Hours Pager: [phone]

## 2012-06-15 NOTE — Progress Notes (Signed)
Please see earlier notes by Dr. Toniann Fail. Patient seen and examined at bedside. I have reviewed current vital signs and available blood work. Patient has been admitted for workup of vomiting and diarrhea, associated with fevers. Presumptive presumptive etiology of gastroenteritis considered, with possible urinary tract infection. I agree with continuing empiric antibiotics at this time and will followup on blood cultures and urine cultures. CT abdomen and pelvis indicative of possible right lung base consolidation versus atelectasis. PEG tube is in place with no obvious complication per imaging studies. In addition no extraluminal bowel inflammatory process notified. I also agree with continuing IV fluids for now. Will obtain BMP and CBC in the morning. Current antibiotics on board Levaquin, vancomycin, aztreonam (start date 06/14/2012).  Debbora Presto, MD  [facility] Pager [phone]  If 7PM-7AM, please contact night-coverage [email or web address] Password [email or web address]

## 2012-06-15 NOTE — Progress Notes (Signed)
Patient ID: [name], male   DOB: [birth year], 56 y.o.   MRN: [id number]           PROGRESS NOTE  DATE:  06/14/2012  FACILITY:  [name]   LEVEL OF CARE: SNF  Acute Visit  CHIEF COMPLAINT:  Manage acute bronchitis and iron deficiency anemia.    HISTORY OF PRESENT ILLNESS: I was requested by the staff to assess the patient regarding above problems:  Acute bronchitis.  Due to coughing, the patient had a chest x-ray done on 06/13/2012 which did not show evidence of pneumonia.  However, bronchitis versus reactive airway disease was suggested.  The patient does not follow commands.   He is nonverbal and aphasic.    Iron deficiency anemia.  On 06/13/2012, hemoglobin was 11.3, MCV 71.8.  In November 2013, hemoglobin 10.8, MCV 72.1.  Patient is on iron and tolerates it without any problems.  Staff do not report bleeding.    PAST MEDICAL HISTORY : Reviewed.  No changes.  CURRENT MEDICATIONS: Reviewed per MAR  ROS: Unobtainable due to pt not following commands  PHYSICAL EXAMINATION  GENERAL: no acute distress, normal body habitus EYES: conjunctivae normal, sclerae normal, normal eye lids NECK: supple, trachea midline, no neck masses, no thyroid tenderness, no thyromegaly LYMPHATICS: no LAN in the neck, no supraclavicular LAN RESPIRATORY: poor effort, no wheezing or crackles CARDIAC: RRR, no murmur,no extra heart sounds, no edema GI: abdomen soft, normal BS, no masses, no tenderness, no hepatomegaly, no splenomegaly, he has a PEG PSYCHIATRIC: the patient is alert, unable to assess orientation, affect & mood appropriate  ASSESSMENT/PLAN:  Acute bronchitis 466.0.  New problem.  Zithromax 500 mg qd for five days and Florastor bid for five days started.    Iron deficiency anemia 280.9.  Hemoglobin improved.  Continue iron.     CPT CODE: 13086

## 2012-06-16 DIAGNOSIS — D649 Anemia, unspecified: Secondary | ICD-10-CM

## 2012-06-16 DIAGNOSIS — F329 Major depressive disorder, single episode, unspecified: Secondary | ICD-10-CM

## 2012-06-16 LAB — BASIC METABOLIC PANEL
BUN: 7 mg/dL (ref 6–23)
Creatinine, Ser: 0.41 mg/dL — ABNORMAL LOW (ref 0.50–1.35)
GFR calc Af Amer: >90 mL/min (ref >90)
GFR calc non Af Amer: >90 mL/min (ref >90)
Glucose, Bld: 111 mg/dL — ABNORMAL HIGH (ref 70–99)
Potassium: 3.6 mEq/L (ref 3.5–5.1)

## 2012-06-16 LAB — CBC
HCT: 29.8 % — ABNORMAL LOW (ref 39.0–52.0)
Hemoglobin: 9.8 g/dL — ABNORMAL LOW (ref 13.0–17.0)
MCH: 24.1 pg — ABNORMAL LOW (ref 26.0–34.0)
MCHC: 32.9 g/dL (ref 30.0–36.0)
MCV: 73.4 fL — ABNORMAL LOW (ref 78.0–100.0)
RDW: 13.6 % (ref 11.5–15.5)

## 2012-06-16 LAB — URINE CULTURE: Colony Count: >=100000

## 2012-06-16 NOTE — Progress Notes (Addendum)
Patient ID: [name], male   DOB: Apr 20, 1955, 57 y.o.   MRN: [id number] [facility] PROGRESS NOTE  [name] NFA:[id number] DOB: February 17, 1956 DOA: 06/14/2012 PCP: [name], MD  Brief narrative: Pt is 57 y.o. male with history of quadriplegia on PEG tube feeds, seizure disorder, Parkinson's disease and Addison's disease who was brought from the nursing home because of persistent nausea, vomiting, and diarrhea that initially started 2-3 days prior to this admission.  In ED, Chest x-ray does not show any definite infiltrates, the patient's mother stated that pt has been having productive cough for last 3 days prior to this admission. Patient admitted for further evaluation and management under [facility] service.   Principal Problem:   Nausea vomiting and diarrhea - possibly related to viral gastroenteritis - pt clinically improving  will continue current supportive care, IVF, analgesia, antiemetics as needed Active Problems:   Adrenal insufficiency - continue home medical regimen for now   Fever - possibly related to bronchitis vs PNA - continue current ABX regime and follow up on sputum cx - plan on narrowing ABX In AM   UTI (lower urinary tract infection) - current ABX regimen should cover   Anemia of chronic disease, microcytic as well - CBC in AM  Consultants:  None  Procedures/Studies: Ct Abdomen Pelvis W Contrast 06/15/2012  Right lung base atelectasis.  Subtle infiltrate secondary less likely consideration.  Bladder calculi.  Mild thickening of the bladder base without significant change.  Primary bladder mucosa abnormality not excluded.  Mild fullness of the renal collecting systems without point of obstruction identified.  This fullness may be related to the urinary bladder fullness.  Fluid within the rectosigmoid region may be related to the diarrhea.  Percutaneous gastrostomy tube is in place.  No obvious complication.  No extraluminal bowel inflammatory  process, free fluid or free air.     Dg Chest Portable 1 View 06/14/2012  Chronic lung changes without acute findings.     Antibiotics:  Vancomycin 03/21 -->  Levaquin 03/21 -->  Aztreonam 03/21 -->  Code Status: Full Family Communication: No family at bedside  Disposition Plan: SBF when medically stable  HPI/Subjective: No events overnight.   Objective: Filed Vitals:   06/15/12 0400 06/15/12 1500 06/15/12 2330 06/16/12 0639  BP: 105/72 116/50 117/72 118/69  Pulse: 80 63 80 81  Temp: 97.9 F (36.6 C) 98.2 F (36.8 C) 98.3 F (36.8 C) 98.1 F (36.7 C)  TempSrc: Oral Axillary Oral Oral  Resp:  25 20 22   Height: 6\' 4"  (1.93 m)     Weight: 68.1 kg (150 lb 2.1 oz)     SpO2: 100% 100% 99% 100%    Intake/Output Summary (Last 24 hours) at 06/16/12 1135 Last data filed at 06/16/12 0731  Gross per 24 hour  Intake 3687.5 ml  Output   1151 ml  Net 2536.5 ml    Exam:   General:  Pt is alert, follows some commands appropriately, not in acute distress  Cardiovascular: Regular rate and rhythm, S1/S2, no murmurs, no rubs, no gallops  Respiratory: Clear to auscultation bilaterally, no wheezing, no crackles, decreased breath sounds at bases with scattered rhonchi   Abdomen: Soft, non tender, non distended, bowel sounds present, no guarding  Extremities: No edema, pulses DP and PT palpable bilaterally  Neuro: quadriplegic, non verbal  Data Reviewed: Basic Metabolic Panel:  Recent Labs Lab 06/13/12 2242 06/14/12 2130 06/15/12 0530 06/16/12 0815  NA 135 132* 133* 137  K 3.6 3.8 3.5  3.6  CL 97 96 98 102  CO2 27 26 25 25   GLUCOSE 92 87 107* 111*  BUN 17 15 13 7   CREATININE 0.60 0.49* 0.55 0.41*  CALCIUM 9.0 8.5 8.1* 8.2*   Liver Function Tests:  Recent Labs Lab 06/13/12 2242 06/14/12 2130 06/15/12 0530  AST 43* 40* 25  ALT 8 27 23   ALKPHOS 141* 123* 105  BILITOT 0.3 0.3 0.3  PROT 8.6* 8.3 7.1  ALBUMIN 3.6 3.1* 2.8*   CBC:  Recent Labs Lab  06/13/12 2242 06/14/12 2130 06/15/12 0530 06/16/12 0815  WBC 10.1 13.2* 8.5 9.1  NEUTROABS 7.5 10.4* 5.5  --   HGB 11.8* 10.8* 9.6* 9.8*  HCT 35.3* 32.8* 29.5* 29.8*  MCV 73.1* 73.4* 73.9* 73.4*  PLT 194 199 161 188    Recent Results (from the past 240 hour(s))  CULTURE, BLOOD (SINGLE)     Status: None   Collection Time    06/13/12 10:35 PM      Result Value Range Status   Specimen Description BLOOD RIGHT HAND   Final   Special Requests     Final   Value: BOTTLES DRAWN AEROBIC AND ANAEROBIC 5cc AER 5cc ANA   Culture  Setup Time 06/14/2012 00:22   Final   Culture     Final   Value:        BLOOD CULTURE RECEIVED NO GROWTH TO DATE CULTURE WILL BE HELD FOR 5 DAYS BEFORE ISSUING A FINAL NEGATIVE REPORT   Report Status PENDING   Incomplete  URINE CULTURE     Status: None   Collection Time    06/13/12 11:30 PM      Result Value Range Status   Specimen Description URINE, CATHETERIZED   Final   Special Requests Normal   Final   Culture  Setup Time 06/14/2012 08:35   Final   Colony Count >=100,000 COLONIES/ML   Final   Culture ESCHERICHIA COLI   Final   Report Status 06/16/2012 FINAL   Final   Organism ID, Bacteria ESCHERICHIA COLI   Final  CULTURE, BLOOD (ROUTINE X 2)     Status: None   Collection Time    06/14/12 11:10 PM      Result Value Range Status   Specimen Description BLOOD RIGHT HAND   Final   Special Requests BOTTLES DRAWN AEROBIC AND ANAEROBIC 4CC   Final   Culture  Setup Time 06/15/2012 03:36   Final   Culture     Final   Value:        BLOOD CULTURE RECEIVED NO GROWTH TO DATE CULTURE WILL BE HELD FOR 5 DAYS BEFORE ISSUING A FINAL NEGATIVE REPORT   Report Status PENDING   Incomplete  CULTURE, BLOOD (ROUTINE X 2)     Status: None   Collection Time    06/14/12 11:20 PM      Result Value Range Status   Specimen Description BLOOD LEFT HAND   Final   Special Requests BOTTLES DRAWN AEROBIC AND ANAEROBIC 5CC   Final   Culture  Setup Time 06/15/2012 03:36   Final    Culture     Final   Value:        BLOOD CULTURE RECEIVED NO GROWTH TO DATE CULTURE WILL BE HELD FOR 5 DAYS BEFORE ISSUING A FINAL NEGATIVE REPORT   Report Status PENDING   Incomplete  URINE CULTURE     Status: None   Collection Time    06/15/12 12:14 AM  Result Value Range Status   Specimen Description URINE, RANDOM   Final   Special Requests NONE   Final   Culture  Setup Time 06/15/2012 04:36   Final   Colony Count PENDING   Incomplete   Culture Culture reincubated for better growth   Final   Report Status PENDING   Incomplete  MRSA PCR SCREENING     Status: Abnormal   Collection Time    06/15/12  5:00 AM      Result Value Range Status   MRSA by PCR POSITIVE (*) NEGATIVE Final   Comment:            The GeneXpert MRSA Assay (FDA     approved for NASAL specimens     only), is one component of a     comprehensive MRSA colonization     surveillance program. It is not     intended to diagnose MRSA     infection nor to guide or     monitor treatment for     MRSA infections.     RESULT CALLED TO, READ BACK BY AND VERIFIED WITH:     K. PHILLIPS RN AT 0755 ON 03.21.14 BY SHUEA     Scheduled Meds: . aztreonam  1 g Intravenous Q8H  . baclofen  10 mg Oral TID  . carbidopa-levodopa  1 tablet Oral TID  . enoxaparin injection  40 mg Subcutaneous Q24H  . ferrous sulfate  300 mg Per Tube q morning - 10a  . fludrocortisone  0.1 mg Oral QPM  . folic acid  1 mg Oral q morning - 10a  . levofloxacin IV  750 mg Intravenous QHS  . loratadine  10 mg Oral Daily  . olopatadine  1 drop Both Eyes q morning - 10a  . pantoprazole sodium  40 mg Per Tube Daily  . phenytoin  100 mg Oral BID  . predniSONE  10 mg Oral q morning - 10a  . saccharomyces boulardii  250 mg Oral BID  . vancomycin  750 mg Intravenous Q8H   Continuous Infusions: . feeding supplement (OSMOLITE 1.5 CAL) 1,000 mL (06/15/12 1007)   Debbora Presto, MD  [facility] Pager [phone]  If 7PM-7AM, please contact  night-coverage [email or web address] Password [facility] 06/16/2012, 11:35 AM   LOS: 2 days

## 2012-06-17 LAB — CBC
Hemoglobin: 9.9 g/dL — ABNORMAL LOW (ref 13.0–17.0)
MCH: 24 pg — ABNORMAL LOW (ref 26.0–34.0)
MCHC: 32.8 g/dL (ref 30.0–36.0)
RDW: 13.6 % (ref 11.5–15.5)

## 2012-06-17 LAB — BASIC METABOLIC PANEL
BUN: 9 mg/dL (ref 6–23)
CO2: 30 mEq/L (ref 19–32)
Calcium: 8.6 mg/dL (ref 8.4–10.5)
Calcium: 8.4 mg/dL (ref 8.4–10.5)
Chloride: 97 mEq/L (ref 96–112)
Glucose, Bld: 92 mg/dL (ref 70–99)
Glucose, Bld: 112 mg/dL — ABNORMAL HIGH (ref 70–99)
Potassium: 3 mEq/L — ABNORMAL LOW (ref 3.5–5.1)
Sodium: 136 mEq/L (ref 135–145)
Sodium: 134 mEq/L — ABNORMAL LOW (ref 135–145)

## 2012-06-17 LAB — URINE CULTURE

## 2012-06-17 LAB — VANCOMYCIN, TROUGH: Vancomycin Tr: 13.7 ug/mL (ref 10.0–20.0)

## 2012-06-17 MED ORDER — GUAIFENESIN 100 MG/5ML PO SYRP: 200.0000 mg | ORAL_SOLUTION | ORAL | Status: DC | PRN

## 2012-06-17 MED ORDER — VANCOMYCIN HCL 1000 MG IV SOLR
750.0000 mg | Freq: Three times a day (TID) | INTRAVENOUS | Status: DC
  Administered 2012-06-17: 750 mg via INTRAVENOUS
  Filled 2012-06-17 (×2): qty 750

## 2012-06-17 MED ORDER — GUAIFENESIN 100 MG/5ML PO SOLN
10.0000 mL | ORAL | Status: DC | PRN
  Administered 2012-06-17: 200 mg via ORAL
  Filled 2012-06-17: qty 10

## 2012-06-17 MED ORDER — POTASSIUM CHLORIDE CRYS ER 20 MEQ PO TBCR
40.0000 meq | EXTENDED_RELEASE_TABLET | Freq: Every day | ORAL | Status: AC
  Administered 2012-06-17 – 2012-06-18 (×2): 40 meq via ORAL
  Filled 2012-06-17 (×2): qty 2

## 2012-06-17 MED ORDER — VANCOMYCIN HCL IN DEXTROSE 1-5 GM/200ML-% IV SOLN
1000.0000 mg | Freq: Three times a day (TID) | INTRAVENOUS | Status: DC
  Administered 2012-06-17 – 2012-06-18 (×2): 1000 mg via INTRAVENOUS
  Filled 2012-06-17 (×4): qty 200

## 2012-06-17 MED ORDER — METRONIDAZOLE IN NACL 5-0.79 MG/ML-% IV SOLN
500.0000 mg | Freq: Three times a day (TID) | INTRAVENOUS | Status: DC
  Administered 2012-06-17 – 2012-06-18 (×3): 500 mg via INTRAVENOUS
  Filled 2012-06-17 (×5): qty 100

## 2012-06-17 NOTE — Progress Notes (Signed)
CRITICAL VALUE ALERT  Critical value received:  Gram + cocci in clusters in aerobic blood culture.   Date of notification: 06/17/2012  Time of notification:  1035  Critical value read back: yes  Nurse who received alert:  Sela Hua RN  MD notified (1st page):  Dr. Lenise Arena  Time of first page: 1040  MD notified (2nd page):  Time of second page:  Responding MD:  Dr. Lenise Arena  Time MD responded: 1045

## 2012-06-17 NOTE — Progress Notes (Addendum)
Patient ID: [name], male   DOB: [birth year], 56 y.o.   MRN: [id number]  [facility] PROGRESS NOTE  [name] IEP:[id number] DOB: 06/24/1955 DOA: 06/14/2012 PCP: [name], MD  Brief narrative:  Pt is 57 y.o. male with history of quadriplegia on PEG tube feeds, seizure disorder, Parkinson's disease and Addison's disease who was brought from the nursing home because of persistent nausea, vomiting, and diarrhea that initially started 2-3 days prior to this admission.   In ED, Chest x-ray does not show any definite infiltrates, the patient's mother stated that pt has been having productive cough for last 3 days prior to this admission. Patient admitted for further evaluation and management under [facility] service.   Principal Problem:  Nausea vomiting and diarrhea   - secondary to C. Diff colitis - will start Flagyl IV for now - pt clinically improving  - will continue current supportive care, IVF, analgesia, antiemetics as needed  Active Problems:  G+ cocci in clusters in blood  - preliminary report - continue Vancomycin  - repeat blood cultures Hypokalemia - secondary to diarrhea - will supplement today - BMP in AM Adrenal insufficiency  - continue home medical regimen for now  Fever  - secondary to C. Diff more likely than PNA, also prelim report on blood culture positive for g+ cocci in clusters  - discontinue broad spectrum ABX for now and start Flagyl, will continue Vancomycin   UTI (lower urinary tract infection)  - stable and pt denies urinary symptoms  Anemia of chronic disease, microcytic as well  - CBC in AM   Consultants:  None Procedures/Studies:  Ct Abdomen Pelvis W Contrast  06/15/2012 Right lung base atelectasis. Subtle infiltrate secondary less likely consideration. Bladder calculi. Mild thickening of the bladder base without significant change. Primary bladder mucosa abnormality not excluded. Mild fullness of the renal collecting systems  without point of obstruction identified. This fullness may be related to the urinary bladder fullness. Fluid within the rectosigmoid region may be related to the diarrhea. Percutaneous gastrostomy tube is in place. No obvious complication. No extraluminal bowel inflammatory process, free fluid or free air.  Dg Chest Portable 1 View  06/14/2012 Chronic lung changes without acute findings.  Antibiotics:  Vancomycin 03/21 -->  Levaquin 03/21 --> 03/23 Aztreonam 03/21 --> 03/23 Flagyl 03/23 -->   Code Status: Full  Family Communication: No family at bedside  Disposition Plan: SBF when medically stable, likely Monday    HPI/Subjective: No events overnight.   Objective: Filed Vitals:   06/16/12 0639 06/16/12 1330 06/16/12 2301 06/17/12 0530  BP: 118/69 108/63 117/74 111/62  Pulse: 81 80 80 81  Temp: 98.1 F (36.7 C) 98.9 F (37.2 C) 98 F (36.7 C) 98.3 F (36.8 C)  TempSrc: Oral Oral Oral Oral  Resp: 22  21 22   Height:      Weight:      SpO2: 100% 100% 100% 99%    Intake/Output Summary (Last 24 hours) at 06/17/12 1024 Last data filed at 06/17/12 0700  Gross per 24 hour  Intake   1975 ml  Output   1300 ml  Net    675 ml    Exam:   General:  Pt is alert, follows commands appropriately, non verbal and not in acute distress  Cardiovascular: Regular rate and rhythm, S1/S2, no murmurs, no rubs, no gallops  Respiratory: Clear to auscultation bilaterally, no wheezing, no crackles, no rhonchi, decreased breath sounds at bases   Abdomen: Soft, non tender, non distended,  bowel sounds present, no guarding  Extremities: No edema, pulses DP and PT palpable bilaterally  Data Reviewed: Basic Metabolic Panel:  Recent Labs Lab 06/13/12 2242 06/14/12 2130 06/15/12 0530 06/16/12 0815 06/17/12 0512  NA 135 132* 133* 137 136  K 3.6 3.8 3.5 3.6 3.0*  CL 97 96 98 102 100  CO2 27 26 25 25 25   GLUCOSE 92 87 107* 111* 92  BUN 17 15 13 7 9   CREATININE 0.60 0.49* 0.55 0.41* 0.41*   CALCIUM 9.0 8.5 8.1* 8.2* 8.6   Liver Function Tests:  Recent Labs Lab 06/13/12 2242 06/14/12 2130 06/15/12 0530  AST 43* 40* 25  ALT 8 27 23   ALKPHOS 141* 123* 105  BILITOT 0.3 0.3 0.3  PROT 8.6* 8.3 7.1  ALBUMIN 3.6 3.1* 2.8*   CBC:  Recent Labs Lab 06/13/12 2242 06/14/12 2130 06/15/12 0530 06/16/12 0815 06/17/12 0512  WBC 10.1 13.2* 8.5 9.1 6.7  NEUTROABS 7.5 10.4* 5.5  --   --   HGB 11.8* 10.8* 9.6* 9.8* 9.9*  HCT 35.3* 32.8* 29.5* 29.8* 30.2*  MCV 73.1* 73.4* 73.9* 73.4* 73.3*  PLT 194 199 161 188 195    Recent Results (from the past 240 hour(s))  CULTURE, BLOOD (SINGLE)     Status: None   Collection Time    06/13/12 10:35 PM      Result Value Range Status   Specimen Description BLOOD RIGHT HAND   Final   Special Requests     Final   Value: BOTTLES DRAWN AEROBIC AND ANAEROBIC 5cc AER 5cc ANA   Culture  Setup Time 06/14/2012 00:22   Final   Culture     Final   Value:        BLOOD CULTURE RECEIVED NO GROWTH TO DATE CULTURE WILL BE HELD FOR 5 DAYS BEFORE ISSUING A FINAL NEGATIVE REPORT   Report Status PENDING   Incomplete  URINE CULTURE     Status: None   Collection Time    06/13/12 11:30 PM      Result Value Range Status   Specimen Description URINE, CATHETERIZED   Final   Special Requests Normal   Final   Culture  Setup Time 06/14/2012 08:35   Final   Colony Count >=100,000 COLONIES/ML   Final   Culture ESCHERICHIA COLI   Final   Report Status 06/16/2012 FINAL   Final   Organism ID, Bacteria ESCHERICHIA COLI   Final  CULTURE, BLOOD (ROUTINE X 2)     Status: None   Collection Time    06/14/12 11:10 PM      Result Value Range Status   Specimen Description BLOOD RIGHT HAND   Final   Special Requests BOTTLES DRAWN AEROBIC AND ANAEROBIC 4CC   Final   Culture  Setup Time 06/15/2012 03:36   Final   Culture     Final   Value:        BLOOD CULTURE RECEIVED NO GROWTH TO DATE CULTURE WILL BE HELD FOR 5 DAYS BEFORE ISSUING A FINAL NEGATIVE REPORT   Report  Status PENDING   Incomplete  CULTURE, BLOOD (ROUTINE X 2)     Status: None   Collection Time    06/14/12 11:20 PM      Result Value Range Status   Specimen Description BLOOD LEFT HAND   Final   Special Requests BOTTLES DRAWN AEROBIC AND ANAEROBIC 5CC   Final   Culture  Setup Time 06/15/2012 03:36   Final   Culture  Final   Value:        BLOOD CULTURE RECEIVED NO GROWTH TO DATE CULTURE WILL BE HELD FOR 5 DAYS BEFORE ISSUING A FINAL NEGATIVE REPORT   Report Status PENDING   Incomplete  URINE CULTURE     Status: None   Collection Time    06/15/12 12:14 AM      Result Value Range Status   Specimen Description URINE, RANDOM   Final   Special Requests NONE   Final   Culture  Setup Time 06/15/2012 04:36   Final   Colony Count >=100,000 COLONIES/ML   Final   Culture ESCHERICHIA COLI   Final   Report Status PENDING   Incomplete  MRSA PCR SCREENING     Status: Abnormal   Collection Time    06/15/12  5:00 AM      Result Value Range Status   MRSA by PCR POSITIVE (*) NEGATIVE Final   Comment:            The GeneXpert MRSA Assay (FDA     approved for NASAL specimens     only), is one component of a     comprehensive MRSA colonization     surveillance program. It is not     intended to diagnose MRSA     infection nor to guide or     monitor treatment for     MRSA infections.     RESULT CALLED TO, READ BACK BY AND VERIFIED WITH:     K. PHILLIPS RN AT 0755 ON 03.21.14 BY SHUEA  CLOSTRIDIUM DIFFICILE BY PCR     Status: Abnormal   Collection Time    06/16/12 10:49 AM      Result Value Range Status   C difficile by pcr POSITIVE (*) NEGATIVE Final   Comment: CRITICAL RESULT CALLED TO, READ BACK BY AND VERIFIED WITH:     KRISTEN PHILLIPS RN AT 1836 06/16/12 BY [facility]     Scheduled Meds: . aztreonam  1 g Intravenous Q8H  . baclofen  10 mg Oral TID  . carbidopa-levodopa  1 tablet Oral TID  . enoxaparin (LOVENOX) injection  40 mg Subcutaneous Q24H  . ferrous sulfate  300 mg Per Tube q  morning - 10a  . fludrocortisone  0.1 mg Oral QPM  . folic acid  1 mg Oral q morning - 10a  . levofloxacin (LEVAQUIN) IV  750 mg Intravenous QHS  . loratadine  10 mg Oral Daily  . metronidazole  500 mg Intravenous Q8H  . olopatadine  1 drop Both Eyes q morning - 10a  . pantoprazole sodium  40 mg Per Tube Daily  . phenytoin  100 mg Oral BID  . predniSONE  10 mg Oral q morning - 10a  . saccharomyces boulardii  250 mg Oral BID  . sodium chloride  3 mL Intravenous Q12H  . vancomycin  750 mg Intravenous Q8H   Continuous Infusions: . feeding supplement (OSMOLITE 1.5 CAL) 1,000 mL (06/16/12 1206)   Debbora Presto, MD  [facility] Pager [phone]  If 7PM-7AM, please contact night-coverage [email or web address] Password [email or web address] 06/17/2012, 10:24 AM   LOS: 3 days

## 2012-06-17 NOTE — Progress Notes (Signed)
  Pharmacy Note (Brief) - IV Vanco  57 yo quadriplegic M on Vanco 750mg  IV q8h for bacteremia (see previous pharmacist note for full details.)  Vanco trough is subtherapeutic (13.7). Goal trough 15-20. SCr remains stable.  1) Increase to Vanco 1g IV q8h 2) Recheck trough at new steady state 3) F/U species/sensitivities.  Darrol Angel, PharmD Pager: [phone] 06/17/2012 6:36 PM

## 2012-06-17 NOTE — Progress Notes (Addendum)
ANTIBIOTIC CONSULT NOTE - INITIAL  Pharmacy Consult for Vancomcyin Indication: bacteremia  Allergies  Allergen Reactions  . Aspirin     unk reaction  . Nsaids     unk reaction  . Penicillins     unk reaction    Patient Measurements: Height: 6\' 4"  (193 cm) Weight: 150 lb 2.1 oz (68.1 kg) IBW/kg (Calculated) : 86.8  Vital Signs: Temp: 98.3 F (36.8 C) (03/23 0530) Temp src: Oral (03/23 0530) BP: 111/62 mmHg (03/23 0530) Pulse Rate: 81 (03/23 0530) Intake/Output from previous day: 03/22 0701 - 03/23 0700 In: 2125 [IV Piggyback:700] Out: 1300 [Urine:1300] Intake/Output from this shift:    Labs:  Recent Labs  06/15/12 0530 06/16/12 0815 06/17/12 0512  WBC 8.5 9.1 6.7  HGB 9.6* 9.8* 9.9*  PLT 161 188 195  CREATININE 0.55 0.41* 0.41*   Estimated Creatinine Clearance: 100.5 ml/min (by C-G formula based on Cr of 0.41).  Microbiology: Recent Results (from the past 720 hour(s))  CULTURE, BLOOD (SINGLE)     Status: None   Collection Time    06/13/12 10:35 PM      Result Value Range Status   Specimen Description BLOOD RIGHT HAND   Final   Special Requests     Final   Value: BOTTLES DRAWN AEROBIC AND ANAEROBIC 5cc AER 5cc ANA   Culture  Setup Time 06/14/2012 00:22   Final   Culture     Final   Value:        BLOOD CULTURE RECEIVED NO GROWTH TO DATE CULTURE WILL BE HELD FOR 5 DAYS BEFORE ISSUING A FINAL NEGATIVE REPORT   Report Status PENDING   Incomplete  URINE CULTURE     Status: None   Collection Time    06/13/12 11:30 PM      Result Value Range Status   Specimen Description URINE, CATHETERIZED   Final   Special Requests Normal   Final   Culture  Setup Time 06/14/2012 08:35   Final   Colony Count >=100,000 COLONIES/ML   Final   Culture ESCHERICHIA COLI   Final   Report Status 06/16/2012 FINAL   Final   Organism ID, Bacteria ESCHERICHIA COLI   Final  CULTURE, BLOOD (ROUTINE X 2)     Status: None   Collection Time    06/14/12 11:10 PM      Result Value Range  Status   Specimen Description BLOOD RIGHT HAND   Final   Special Requests BOTTLES DRAWN AEROBIC AND ANAEROBIC 4CC   Final   Culture  Setup Time 06/15/2012 03:36   Final   Culture     Final   Value:        BLOOD CULTURE RECEIVED NO GROWTH TO DATE CULTURE WILL BE HELD FOR 5 DAYS BEFORE ISSUING A FINAL NEGATIVE REPORT   Report Status PENDING   Incomplete  CULTURE, BLOOD (ROUTINE X 2)     Status: None   Collection Time    06/14/12 11:20 PM      Result Value Range Status   Specimen Description BLOOD LEFT HAND   Final   Special Requests BOTTLES DRAWN AEROBIC AND ANAEROBIC 5CC   Final   Culture  Setup Time 06/15/2012 03:36   Final   Culture     Final   Value: STAPHYLOCOCCUS SPECIES     Note: Gram Stain Report Called to,Read Back By and Verified With: Darion Milewski [email or web address]  ON [id number] BY [facility]   Report Status PENDING   Incomplete  URINE CULTURE  Status: None   Collection Time    06/15/12 12:14 AM      Result Value Range Status   Specimen Description URINE, RANDOM   Final   Special Requests NONE   Final   Culture  Setup Time 06/15/2012 04:36   Final   Colony Count >=100,000 COLONIES/ML   Final   Culture ESCHERICHIA COLI   Final   Report Status PENDING   Incomplete  MRSA PCR SCREENING     Status: Abnormal   Collection Time    06/15/12  5:00 AM      Result Value Range Status   MRSA by PCR POSITIVE (*) NEGATIVE Final   Comment:            The GeneXpert MRSA Assay (FDA     approved for NASAL specimens     only), is one component of a     comprehensive MRSA colonization     surveillance program. It is not     intended to diagnose MRSA     infection nor to guide or     monitor treatment for     MRSA infections.     RESULT CALLED TO, READ BACK BY AND VERIFIED WITH:     K. PHILLIPS RN AT 0755 ON 03.21.14 BY SHUEA  CLOSTRIDIUM DIFFICILE BY PCR     Status: Abnormal   Collection Time    06/16/12 10:49 AM      Result Value Range Status   C difficile by pcr POSITIVE (*) NEGATIVE Final    Comment: CRITICAL RESULT CALLED TO, READ BACK BY AND VERIFIED WITH:     Lauralee Evener RN AT 1836 06/16/12 BY [facility]    Medical History: Past Medical History  Diagnosis Date  . Quadriplegia   . Depression   . Anemia   . Parkinson's disease   . Seizures   . Adrenal insufficiency     Assessment: 69 YOM with quadriplegia on PEG tube feeds at home admitted with nausea/vomiting/diarrhea 2/2 C.diff colitis (currently being treated with IV Flagyl).  Pt now has 1/2 blood cultures growing staphylococcus species.  Patient was already started on vancomycin on 3/21 empirically.   Goal of Therapy:  Vancomycin trough level 15-20 mcg/ml  Plan:  Continue vancomycin 750 mg IV q8h.   Check trough today. F/u culture results for ID and sensitivities. If culture results as staph aureus, recommend consulting infectious disease for assistance with effective treatment duration and evaluation for endocarditis.  Clance Boll 06/17/2012,12:51 PM

## 2012-06-18 LAB — CBC
HCT: 27.5 % — ABNORMAL LOW (ref 39.0–52.0)
Hemoglobin: 9.1 g/dL — ABNORMAL LOW (ref 13.0–17.0)
MCH: 23.9 pg — ABNORMAL LOW (ref 26.0–34.0)
MCV: 72.4 fL — ABNORMAL LOW (ref 78.0–100.0)
RBC: 3.8 MIL/uL — ABNORMAL LOW (ref 4.22–5.81)
WBC: 6.2 10*3/uL (ref 4.0–10.5)

## 2012-06-18 LAB — CULTURE, BLOOD (ROUTINE X 2)

## 2012-06-18 MED ORDER — SACCHAROMYCES BOULARDII 250 MG PO CAPS: 250.0000 mg | ORAL_CAPSULE | Freq: Two times a day (BID) | ORAL | Status: DC

## 2012-06-18 MED ORDER — METRONIDAZOLE 500 MG PO TABS: 500.0000 mg | ORAL_TABLET | Freq: Three times a day (TID) | ORAL | Status: DC

## 2012-06-18 MED ORDER — POTASSIUM CHLORIDE CRYS ER 20 MEQ PO TBCR
40.0000 meq | EXTENDED_RELEASE_TABLET | Freq: Every day | ORAL | Status: DC
  Filled 2012-06-18: qty 2

## 2012-06-18 MED ORDER — POTASSIUM CHLORIDE CRYS ER 20 MEQ PO TBCR: 40.0000 meq | EXTENDED_RELEASE_TABLET | Freq: Every day | ORAL | Status: DC

## 2012-06-18 NOTE — Progress Notes (Signed)
Patient is set to discharge back to [facility] today. Patient's brother & mother aware.[facility] called for transport, discharge packet in [place]. Facility aware.   Unice Bailey, LCSW [facility] Clinical Social Worker cell #: [phone]

## 2012-06-18 NOTE — Progress Notes (Signed)
Report called to RN at [facility]. Discharge reviewed and all questions answered. Julio Sicks RN

## 2012-06-18 NOTE — Discharge Summary (Signed)
Physician Discharge Summary  [name] ZOX:[id number] DOB: 11-Feb-1956 DOA: 06/14/2012  PCP: [name], MD  Admit date: 06/14/2012 Discharge date: 06/18/2012  Recommendations for Outpatient Follow-up:  1. Pt will need to follow up with PCP in 2-3 weeks post discharge 2. Please obtain BMP to evaluate electrolytes and kidney function 3. Please also check CBC to evaluate Hg and Hct levels 4. Please note that pt was discharged on Flagyl 500 mg PO TID to take for 12 more days post discharge 5. Ciprofloxacin was discontinued as pt was treated with this medication for UTI while in SNF 6. Please also note that pt was given potassium 40 mew prior to discharge and I have prescribed K-dur to be taken for 5 more days 40 MEQ daily and please check potassium level after 5 days to ensure it is stable and within normal limits   Discharge Diagnoses: Diarrhea secondary to C. Diff colitis  Principal Problem:   Nausea vomiting and diarrhea Active Problems:   Adrenal insufficiency   Fever   UTI (lower urinary tract infection)  Discharge Condition: Stable  Diet recommendation: PEG tube feeding   Brief narrative:  Pt is 57 y.o. male with history of quadriplegia on PEG tube feeds, seizure disorder, Parkinson's disease and Addison's disease who was brought from the nursing home because of persistent nausea, vomiting, and diarrhea that initially started 2-3 days prior to this admission.   In ED, Chest x-ray does not show any definite infiltrates, the patient's mother stated that pt has been having productive cough for last 3 days prior to this admission. Patient admitted for further evaluation and management under [facility] service.   Principal Problem:  Nausea vomiting and diarrhea  - secondary to C. Diff colitis  - pt started on Flagyl and will continue taking it for 12 more days post discharge  - pt clinically improving  Active Problems:  G+ cocci in clusters in blood  - preliminary report and  1/2 cultures - Vancomycin started but discussed with ID and recommendation was to stop Vancomycin as this is likely contaminant  Hypokalemia  - secondary to diarrhea  - will supplement today and pt will continue taking post discharge  Adrenal insufficiency  - continue home medical regimen for now  Fever  - secondary to C. Diff - continue Flagyl as noted above UTI (lower urinary tract infection)  - stable and pt denies urinary symptoms  - Cipro has been provided in SNF - we discontinued due to C. Diff and pt likely chronically colonized with E. Coli Anemia of chronic disease, microcytic as well  - hg and hct stable and ant pt's baseline   Consultants:  None Procedures/Studies:  Ct Abdomen Pelvis W Contrast  06/15/2012 Right lung base atelectasis. Subtle infiltrate secondary less likely consideration. Bladder calculi. Mild thickening of the bladder base without significant change. Primary bladder mucosa abnormality not excluded. Mild fullness of the renal collecting systems without point of obstruction identified. This fullness may be related to the urinary bladder fullness. Fluid within the rectosigmoid region may be related to the diarrhea. Percutaneous gastrostomy tube is in place. No obvious complication. No extraluminal bowel inflammatory process, free fluid or free air.  Dg Chest Portable 1 View  06/14/2012 Chronic lung changes without acute findings.  Antibiotics:  Vancomycin 03/21 --> 03/24 Levaquin 03/21 --> 03/23  Aztreonam 03/21 --> 03/23  Flagyl 03/23 --> 07/01/2012  Code Status: Full  Family Communication: No family at bedside   Discharge Exam: Filed Vitals:   06/18/12  0708  BP: 110/63  Pulse: 67  Temp: 98.4 F (36.9 C)  Resp: 24   Filed Vitals:   06/17/12 0530 06/17/12 1320 06/17/12 2159 06/18/12 0708  BP: 111/62 105/59 116/59 110/63  Pulse: 81 85 77 67  Temp: 98.3 F (36.8 C) 98.3 F (36.8 C) 98.5 F (36.9 C) 98.4 F (36.9 C)  TempSrc: Oral Oral Oral  Axillary  Resp: 22 20 22 24   Height:      Weight:      SpO2: 99% 99% 97% 98%    General: Pt is alert, not able to follow commands appropriately, not in acute distress Cardiovascular: Regular rate and rhythm, S1/S2 +, no murmurs, no rubs, no gallops Respiratory: Clear to auscultation bilaterally, no wheezing, no crackles, no rhonchi Abdominal: Soft, non tender, non distended, bowel sounds +, no guarding Extremities: no edema, no cyanosis, pulses palpable bilaterally DP and PT  Discharge Instructions  Discharge Orders   Future Orders Complete By Expires     Diet - low sodium heart healthy  As directed     Increase activity slowly  As directed         Medication List    STOP taking these medications       azithromycin 500 MG tablet  Commonly known as:  ZITHROMAX     ciprofloxacin 500 MG tablet  Commonly known as:  CIPRO      TAKE these medications       acetaminophen 650 MG suppository  Commonly known as:  TYLENOL  Place 650 mg rectally every 4 (four) hours as needed for pain.     baclofen 10 MG tablet  Commonly known as:  LIORESAL  Give 10 mg by tube 3 (three) times daily.     carbidopa-levodopa 25-100 MG per tablet  Commonly known as:  SINEMET IR  1 tablet by PEG Tube route 3 (three) times daily.     cetirizine 5 MG tablet  Commonly known as:  ZYRTEC  5 mg by PEG Tube route every morning.     docusate 50 MG/5ML liquid  Commonly known as:  COLACE  Give 50 mg by tube every 12 (twelve) hours.     esomeprazole 20 MG packet  Commonly known as:  NEXIUM  20 mg by PEG Tube route every morning.     ferrous sulfate 220 (44 FE) MG/5ML solution  330 mg by PEG Tube route every morning.     fludrocortisone 0.1 MG tablet  Commonly known as:  FLORINEF  0.1 mg by PEG Tube route every evening.     folic acid 1 MG tablet  Commonly known as:  FOLVITE  1 mg by PEG Tube route every morning.     ipratropium-albuterol 0.5-2.5 (3) MG/3ML Soln  Commonly known as:  DUONEB   Take 3 mLs by nebulization every 6 (six) hours as needed (for shortness of breath). For shortness of breath     metroNIDAZOLE 500 MG tablet  Commonly known as:  FLAGYL  Take 1 tablet (500 mg total) by mouth 3 (three) times daily.     olopatadine 0.1 % ophthalmic solution  Commonly known as:  PATANOL  Place 1 drop into both eyes every morning.     phenytoin 100 MG/4ML suspension  Commonly known as:  DILANTIN  100 mg by PEG Tube route 2 (two) times daily.     polyethylene glycol packet  Commonly known as:  MIRALAX / GLYCOLAX  17 g by PEG Tube route 2 (two) times daily.  polyvinyl alcohol 1.4 % ophthalmic solution  Commonly known as:  LIQUIFILM TEARS  Place 1 drop into both eyes daily.     potassium chloride SA 20 MEQ tablet  Commonly known as:  K-DUR,KLOR-CON  Take 2 tablets (40 mEq total) by mouth daily.     predniSONE 10 MG tablet  Commonly known as:  DELTASONE  Give 10 mg by tube every morning.     saccharomyces boulardii 250 MG capsule  Commonly known as:  FLORASTOR  Take 1 capsule (250 mg total) by mouth 2 (two) times daily.     TwoCal HN Liqd  Give 1 Can by tube continuous. Run@ 55cc/hr for 20 hrs. Turn off pump 1 hour before and keep off until 1 hour after phenytoin dose           Follow-up Information   Follow up with [name], MD. (As needed)    Contact information:   [address] [place] [address] [phone]        The results of significant diagnostics from this hospitalization (including imaging, microbiology, ancillary and laboratory) are listed below for reference.     Microbiology: Recent Results (from the past 240 hour(s))  CULTURE, BLOOD (SINGLE)     Status: None   Collection Time    06/13/12 10:35 PM      Result Value Range Status   Specimen Description BLOOD RIGHT HAND   Final   Special Requests     Final   Value: BOTTLES DRAWN AEROBIC AND ANAEROBIC 5cc AER 5cc ANA   Culture  Setup Time 06/14/2012 00:22   Final    Culture     Final   Value:        BLOOD CULTURE RECEIVED NO GROWTH TO DATE CULTURE WILL BE HELD FOR 5 DAYS BEFORE ISSUING A FINAL NEGATIVE REPORT   Report Status PENDING   Incomplete  URINE CULTURE     Status: None   Collection Time    06/13/12 11:30 PM      Result Value Range Status   Specimen Description URINE, CATHETERIZED   Final   Special Requests Normal   Final   Culture  Setup Time 06/14/2012 08:35   Final   Colony Count >=100,000 COLONIES/ML   Final   Culture ESCHERICHIA COLI   Final   Report Status 06/16/2012 FINAL   Final   Organism ID, Bacteria ESCHERICHIA COLI   Final  CULTURE, BLOOD (ROUTINE X 2)     Status: None   Collection Time    06/14/12 11:10 PM      Result Value Range Status   Specimen Description BLOOD RIGHT HAND   Final   Special Requests BOTTLES DRAWN AEROBIC AND ANAEROBIC 4CC   Final   Culture  Setup Time 06/15/2012 03:36   Final   Culture     Final   Value:        BLOOD CULTURE RECEIVED NO GROWTH TO DATE CULTURE WILL BE HELD FOR 5 DAYS BEFORE ISSUING A FINAL NEGATIVE REPORT   Report Status PENDING   Incomplete  CULTURE, BLOOD (ROUTINE X 2)     Status: None   Collection Time    06/14/12 11:20 PM      Result Value Range Status   Specimen Description BLOOD LEFT HAND   Final   Special Requests BOTTLES DRAWN AEROBIC AND ANAEROBIC 5CC   Final   Culture  Setup Time 06/15/2012 03:36   Final   Culture     Final  Value: STAPHYLOCOCCUS SPECIES (COAGULASE NEGATIVE)     Note: THE SIGNIFICANCE OF ISOLATING THIS ORGANISM FROM A SINGLE SET OF BLOOD CULTURES WHEN MULTIPLE SETS ARE DRAWN IS UNCERTAIN. PLEASE NOTIFY THE MICROBIOLOGY DEPARTMENT WITHIN ONE WEEK IF SPECIATION AND SENSITIVITIES ARE REQUIRED.     Note: Gram Stain Report Called to,Read Back By and Verified With: AMANDA THOMPSON@1036  ON [id number] BY [facility]   Report Status 06/18/2012 FINAL   Final  URINE CULTURE     Status: None   Collection Time    06/15/12 12:14 AM      Result Value Range Status   Specimen  Description URINE, RANDOM   Final   Special Requests NONE   Final   Culture  Setup Time 06/15/2012 04:36   Final   Colony Count >=100,000 COLONIES/ML   Final   Culture ESCHERICHIA COLI   Final   Report Status 06/17/2012 FINAL   Final   Organism ID, Bacteria ESCHERICHIA COLI   Final  MRSA PCR SCREENING     Status: Abnormal   Collection Time    06/15/12  5:00 AM      Result Value Range Status   MRSA by PCR POSITIVE (*) NEGATIVE Final   Comment:            The GeneXpert MRSA Assay (FDA     approved for NASAL specimens     only), is one component of a     comprehensive MRSA colonization     surveillance program. It is not     intended to diagnose MRSA     infection nor to guide or     monitor treatment for     MRSA infections.     RESULT CALLED TO, READ BACK BY AND VERIFIED WITH:     K. PHILLIPS RN AT [phone] ON 03.21.14 BY SHUEA  CLOSTRIDIUM DIFFICILE BY PCR     Status: Abnormal   Collection Time    06/16/12 10:49 AM      Result Value Range Status   C difficile by pcr POSITIVE (*) NEGATIVE Final   Comment: CRITICAL RESULT CALLED TO, READ BACK BY AND VERIFIED WITH:     KRISTEN PHILLIPS RN AT 1836 06/16/12 BY WOOLLENK  STOOL CULTURE     Status: None   Collection Time    06/16/12 10:49 AM      Result Value Range Status   Specimen Description STOOL   Final   Special Requests NONE   Final   Culture Culture reincubated for better growth   Final   Report Status PENDING   Incomplete     Labs: Basic Metabolic Panel:  Recent Labs Lab 06/14/12 2130 06/15/12 0530 06/16/12 0815 06/17/12 0512 06/17/12 1622  NA 132* 133* 137 136 134*  K 3.8 3.5 3.6 3.0* 3.4*  CL 96 98 102 100 97  CO2 26 25 25 25 30   GLUCOSE 87 107* 111* 92 112*  BUN 15 13 7 9 8   CREATININE 0.49* 0.55 0.41* 0.41* 0.44*  CALCIUM 8.5 8.1* 8.2* 8.6 8.4   Liver Function Tests:  Recent Labs Lab 06/13/12 2242 06/14/12 2130 06/15/12 0530  AST 43* 40* 25  ALT 8 27 23   ALKPHOS 141* 123* 105  BILITOT 0.3 0.3 0.3   PROT 8.6* 8.3 7.1  ALBUMIN 3.6 3.1* 2.8*   CBC:  Recent Labs Lab 06/13/12 2242 06/14/12 2130 06/15/12 0530 06/16/12 0815 06/17/12 0512 06/18/12 0504  WBC 10.1 13.2* 8.5 9.1 6.7 6.2  NEUTROABS 7.5 10.4* 5.5  --   --   --  HGB 11.8* 10.8* 9.6* 9.8* 9.9* 9.1*  HCT 35.3* 32.8* 29.5* 29.8* 30.2* 27.5*  MCV 73.1* 73.4* 73.9* 73.4* 73.3* 72.4*  PLT 194 199 161 188 195 199   SIGNED: Time coordinating discharge: Over 30 minutes  Debbora Presto, MD  [facility] 06/18/2012, 10:51 AM Pager [phone]  If 7PM-7AM, please contact night-coverage [email or web address] Password [email or web address]

## 2012-06-20 LAB — STOOL CULTURE

## 2012-06-20 LAB — CULTURE, BLOOD (SINGLE)

## 2012-06-21 ENCOUNTER — Non-Acute Institutional Stay (SKILLED_NURSING_FACILITY): Payer: Medicare Other | Admitting: Internal Medicine

## 2012-06-21 DIAGNOSIS — A0472 Enterocolitis due to Clostridium difficile, not specified as recurrent: Secondary | ICD-10-CM | POA: Diagnosis not present

## 2012-06-21 DIAGNOSIS — E2749 Other adrenocortical insufficiency: Secondary | ICD-10-CM

## 2012-06-21 DIAGNOSIS — E2740 Unspecified adrenocortical insufficiency: Secondary | ICD-10-CM

## 2012-06-21 DIAGNOSIS — J301 Allergic rhinitis due to pollen: Secondary | ICD-10-CM

## 2012-06-21 DIAGNOSIS — D638 Anemia in other chronic diseases classified elsewhere: Secondary | ICD-10-CM | POA: Diagnosis not present

## 2012-06-21 LAB — CULTURE, BLOOD (ROUTINE X 2)

## 2012-06-24 LAB — CULTURE, BLOOD (ROUTINE X 2): Culture: NO GROWTH

## 2012-07-02 NOTE — Progress Notes (Signed)
Patient ID: [name], male   DOB: 11/01/1955, 57 y.o.   MRN: [id number]        HISTORY & PHYSICAL  DATE:   06/21/2012  FACILITY:  [name]   LEVEL OF CARE: SNF   ALLERGIES:  Allergies  Allergen Reactions  . Aspirin     unk reaction  . Nsaids     unk reaction  . Penicillins     unk reaction     CHIEF COMPLAINT:  Manage C. difficile colitis, adrenal insufficiency and anemia of chronic disease.    HISTORY OF PRESENT ILLNESS:   57 year-old, African-American male with a history of quadriplegia was hospitalized secondary to persistent nausea,  vomiting and diarrhea.  He was diagnosed with C. difficile colitis and started on flagyl.  He is readmitted back to the facility for short-term rehabilitation.  He does not follow commands.    ADRENAL INSUFFICIENCY:  Patient is on florinef and prednisone.  He has been stable.    ANEMIA: The anemia has been stable. Staff do not report any bleeding. No complications from the medications currently being used.  Last hemoglobins are 9.1, 9.9 and 9.8.    PAST MEDICAL HISTORY :  Past Medical History  Diagnosis Date  . Quadriplegia   . Depression   . Anemia   . Parkinson's disease   . Seizures   . Adrenal insufficiency      PAST SURGICAL HISTORY: Past Surgical History  Procedure Laterality Date  . Peg tube placement    . Peripherally inserted central catheter insertion       SOCIAL HISTORY:  reports that he has never smoked. He has never used smokeless tobacco. He reports that he does not drink alcohol or use illicit drugs. (per record)   FAMILY HISTORY: unobtainable  CURRENT MEDICATIONS: Reviewed per [facility]  REVIEW OF SYSTEMS:  Unobtainable.  Patient is a poor historian.   PHYSICAL EXAMINATION  VS:  T  98      P  82     RR 20      BP  110/75     POX%        WT (Lb) 150.2  GENERAL: no acute distress, normal body habitus SKIN: warm & dry, no suspicious lesions or rashes, no excessive dryness EYES: conjunctivae normal,  sclerae normal, normal eye lids MOUTH/THROAT: lips without lesions,no lesions in the mouth,tongue is without lesions,uvula elevates in midline NECK: supple, trachea midline, no neck masses, no thyroid tenderness, no thyromegaly LYMPHATICS: no LAN in the neck, no supraclavicular LAN RESPIRATORY: breathing is even & unlabored, BS CTAB CARDIAC: RRR, no murmur,no extra heart sounds, no edema GI:  ABDOMEN: abdomen soft, normal BS, no masses, no tenderness, has a PEG tube LIVER/SPLEEN: no hepatomegaly, no splenomegaly MUSCULOSKELETAL: HEAD: normal to inspection & palpation BACK: no kyphosis, scoliosis or spinal processes tenderness EXTREMITIES: LEFT UPPER EXTREMITY: contracted, unable to assess  RIGHT UPPER EXTREMITY: contracted, strength intact, range of motion moderate LEFT LOWER EXTREMITY: strength decreased, range of motion moderate RIGHT LOWER EXTREMITY: strength decreased, range of motion moderate PSYCHIATRIC: the patient is alert  LABS/RADIOLOGY: Hemoglobin 9.1, MCV 72.4, otherwise CBC normal.  Albumin 2.8, otherwise liver profile normal.   Potassium 3.4, otherwise BMP normal.   CT of the abdomen and pelvis showed no acute process.  Chest x-ray:  No acute disease.   ASSESSMENT/PLAN:  C. difficile colitis.  Continue flagyl.  Adrenal insufficiency.  Stable.   Anemia of chronic disease.  Continue iron.  Reassess hemoglobin level.  Allergic rhinitis.  Stable.   Seizure disorder.  Well controlled.   Constipation.  Continue current laxatives.  Hypokalemia.  Continue supplementation.  Reassess.   Parkinsonism.  Continue sinemet.   V58.69.  Check CBC and BMP.   I have reviewed patient's medical records received from hospitalization.  CPT CODE: [address].

## 2012-07-03 DIAGNOSIS — D126 Benign neoplasm of colon, unspecified: Secondary | ICD-10-CM | POA: Diagnosis not present

## 2012-07-05 DIAGNOSIS — I10 Essential (primary) hypertension: Secondary | ICD-10-CM | POA: Diagnosis not present

## 2012-07-05 DIAGNOSIS — D649 Anemia, unspecified: Secondary | ICD-10-CM | POA: Diagnosis not present

## 2012-08-03 DIAGNOSIS — D649 Anemia, unspecified: Secondary | ICD-10-CM | POA: Diagnosis not present

## 2012-08-03 DIAGNOSIS — Z833 Family history of diabetes mellitus: Secondary | ICD-10-CM | POA: Diagnosis not present

## 2012-08-03 DIAGNOSIS — G8250 Quadriplegia, unspecified: Secondary | ICD-10-CM | POA: Diagnosis not present

## 2012-08-03 DIAGNOSIS — Z79899 Other long term (current) drug therapy: Secondary | ICD-10-CM | POA: Diagnosis not present

## 2012-08-03 DIAGNOSIS — D126 Benign neoplasm of colon, unspecified: Secondary | ICD-10-CM | POA: Diagnosis not present

## 2012-08-03 DIAGNOSIS — Z01818 Encounter for other preprocedural examination: Secondary | ICD-10-CM | POA: Diagnosis not present

## 2012-08-03 DIAGNOSIS — Z880 Allergy status to penicillin: Secondary | ICD-10-CM | POA: Diagnosis not present

## 2012-08-03 DIAGNOSIS — Z886 Allergy status to analgesic agent status: Secondary | ICD-10-CM | POA: Diagnosis not present

## 2012-08-03 DIAGNOSIS — G378 Other specified demyelinating diseases of central nervous system: Secondary | ICD-10-CM | POA: Diagnosis not present

## 2012-08-03 DIAGNOSIS — Z86010 Personal history of colonic polyps: Secondary | ICD-10-CM | POA: Diagnosis not present

## 2012-08-03 DIAGNOSIS — R9431 Abnormal electrocardiogram [ECG] [EKG]: Secondary | ICD-10-CM | POA: Diagnosis not present

## 2012-08-03 DIAGNOSIS — K648 Other hemorrhoids: Secondary | ICD-10-CM | POA: Diagnosis not present

## 2012-08-03 DIAGNOSIS — Z993 Dependence on wheelchair: Secondary | ICD-10-CM | POA: Diagnosis not present

## 2012-08-03 DIAGNOSIS — R1310 Dysphagia, unspecified: Secondary | ICD-10-CM | POA: Diagnosis not present

## 2012-08-09 ENCOUNTER — Non-Acute Institutional Stay (SKILLED_NURSING_FACILITY): Payer: Medicare Other | Admitting: Internal Medicine

## 2012-08-09 DIAGNOSIS — R569 Unspecified convulsions: Secondary | ICD-10-CM | POA: Diagnosis not present

## 2012-08-09 DIAGNOSIS — G20 Parkinson's disease: Secondary | ICD-10-CM | POA: Diagnosis not present

## 2012-08-09 DIAGNOSIS — D509 Iron deficiency anemia, unspecified: Secondary | ICD-10-CM

## 2012-08-09 DIAGNOSIS — E2749 Other adrenocortical insufficiency: Secondary | ICD-10-CM

## 2012-08-10 DIAGNOSIS — D509 Iron deficiency anemia, unspecified: Secondary | ICD-10-CM

## 2012-08-10 DIAGNOSIS — G378 Other specified demyelinating diseases of central nervous system: Secondary | ICD-10-CM | POA: Diagnosis not present

## 2012-08-10 DIAGNOSIS — E2749 Other adrenocortical insufficiency: Secondary | ICD-10-CM | POA: Insufficient documentation

## 2012-08-10 DIAGNOSIS — R569 Unspecified convulsions: Secondary | ICD-10-CM | POA: Insufficient documentation

## 2012-08-10 DIAGNOSIS — G20 Parkinson's disease: Secondary | ICD-10-CM | POA: Insufficient documentation

## 2012-08-10 DIAGNOSIS — Z860100 Personal history of colon polyps, unspecified: Secondary | ICD-10-CM | POA: Diagnosis not present

## 2012-08-10 DIAGNOSIS — K648 Other hemorrhoids: Secondary | ICD-10-CM | POA: Diagnosis not present

## 2012-08-10 DIAGNOSIS — D126 Benign neoplasm of colon, unspecified: Secondary | ICD-10-CM | POA: Diagnosis not present

## 2012-08-10 DIAGNOSIS — G20A1 Parkinson's disease without dyskinesia, without mention of fluctuations: Secondary | ICD-10-CM | POA: Insufficient documentation

## 2012-08-10 DIAGNOSIS — G8250 Quadriplegia, unspecified: Secondary | ICD-10-CM | POA: Diagnosis not present

## 2012-08-10 DIAGNOSIS — Z993 Dependence on wheelchair: Secondary | ICD-10-CM | POA: Diagnosis not present

## 2012-08-10 HISTORY — DX: Other adrenocortical insufficiency: E27.49

## 2012-08-10 HISTORY — DX: Iron deficiency anemia, unspecified: D50.9

## 2012-08-10 NOTE — Progress Notes (Signed)
PROGRESS NOTE  DATE: 08/09/2012  FACILITY: Nursing Home Location: [facility]  LEVEL OF CARE: SNF (31)  Routine Visit  CHIEF COMPLAINT:  Manage seizure disorder and iron deficiency anemia  HISTORY OF PRESENT ILLNESS:  REASSESSMENT OF ONGOING PROBLEM(S):  1. ANEMIA: The anemia has been stable. The staff deny fatigue, melena or hematochezia. No complications from the medications currently being used. Patient is a poor historian. Hemoglobin 10.7, MCV 74.1 in 4/14.  2. SEIZURE DISORDER: The patient's seizure disorder remains stable. No complications reported from the medications presently being used. Staff do not report any recent seizure activity.  PAST MEDICAL HISTORY : Reviewed.  No changes.  CURRENT MEDICATIONS: Reviewed per [facility]  REVIEW OF SYSTEMS: Unobtainable due to patient being a poor historian  PHYSICAL EXAMINATION  VS:  T 98.6       P 71      RR 20      BP 125/72     POX %     WT (Lb) 154  GENERAL: no acute distress, thin body habitus NECK: supple, trachea midline, no neck masses, no thyroid tenderness, no thyromegaly RESPIRATORY: breathing is even & unlabored, BS CTAB CARDIAC: RRR, no murmur,no extra heart sounds, no edema GI: abdomen soft, normal BS, no masses, no tenderness, no hepatomegaly, no splenomegaly, patient has a PEG PSYCHIATRIC: the patient is alert & oriented to person, affect & behavior appropriate  LABS/RADIOLOGY:  4/14 hemoglobin 10.7, MCV 74.1 otherwise CBC normal, glucose 116 otherwise BMP normal 12/13 Dilantin 16.2 11/13 alkaline phosphatase 171 otherwise liver profile normal  ASSESSMENT/PLAN:  1. seizure disorder-well-controlled. 2. iron deficiency anemia-stable. 3. adrenal insufficiency-stable. 4. parkinsonism- stable. 5. dysphagia-continue tube feeds. 6. allergic rhinitis-well-controlled. 7. check liver profile.  CPT CODE: 78295

## 2012-08-28 ENCOUNTER — Non-Acute Institutional Stay (SKILLED_NURSING_FACILITY): Payer: Medicare Other | Admitting: Internal Medicine

## 2012-08-28 DIAGNOSIS — E2749 Other adrenocortical insufficiency: Secondary | ICD-10-CM | POA: Diagnosis not present

## 2012-08-28 DIAGNOSIS — E2740 Unspecified adrenocortical insufficiency: Secondary | ICD-10-CM

## 2012-08-28 DIAGNOSIS — G20 Parkinson's disease: Secondary | ICD-10-CM | POA: Diagnosis not present

## 2012-08-28 DIAGNOSIS — R569 Unspecified convulsions: Secondary | ICD-10-CM

## 2012-08-28 DIAGNOSIS — D509 Iron deficiency anemia, unspecified: Secondary | ICD-10-CM

## 2012-08-30 DIAGNOSIS — Z79899 Other long term (current) drug therapy: Secondary | ICD-10-CM | POA: Diagnosis not present

## 2012-08-30 DIAGNOSIS — E7800 Pure hypercholesterolemia, unspecified: Secondary | ICD-10-CM | POA: Diagnosis not present

## 2012-08-30 NOTE — Progress Notes (Signed)
PROGRESS NOTE  DATE: 08-28-12  FACILITY: Nursing Home Location: [facility]  LEVEL OF CARE: SNF (31)  Routine Visit  CHIEF COMPLAINT:  Manage seizure disorder and iron deficiency anemia  HISTORY OF PRESENT ILLNESS:  REASSESSMENT OF ONGOING PROBLEM(S):  1. ANEMIA: The anemia has been stable. The staff deny fatigue, melena or hematochezia. No complications from the medications currently being used. Patient is a poor historian. Hemoglobin 10.7, MCV 74.1 in 4/14,  In  5-14 hemoglobin 11.9, MCV 74.9.  2. SEIZURE DISORDER: The patient's seizure disorder remains stable. No complications reported from the medications presently being used. Staff do not report any recent seizure activity.  PAST MEDICAL HISTORY : Reviewed.  No changes.  CURRENT MEDICATIONS: Reviewed per [facility]  REVIEW OF SYSTEMS: Unobtainable due to patient being a poor historian  PHYSICAL EXAMINATION  VS:  T 98.6       P 70      RR 20      BP 125/72     POX %     WT (Lb) 154  GENERAL: no acute distress, thin body habitus NECK: supple, trachea midline, no neck masses, no thyroid tenderness, no thyromegaly RESPIRATORY: breathing is even & unlabored, BS CTAB CARDIAC: RRR, no murmur,no extra heart sounds, no edema GI: abdomen soft, normal BS, no masses, no tenderness, no hepatomegaly, no splenomegaly, patient has a PEG PSYCHIATRIC: the patient is alert & oriented to person, affect & behavior appropriate  LABS/RADIOLOGY:  5-14 BMP normal, hemoglobin 11.9, MCV 74.9 otherwise CBC normal  4/14 hemoglobin 10.7, MCV 74.1 otherwise CBC normal, glucose 116 otherwise BMP normal 12/13 Dilantin 16.2 11/13 alkaline phosphatase 171 otherwise liver profile normal  ASSESSMENT/PLAN:  1. seizure disorder-well-controlled. 2. iron deficiency anemia-stable. 3. adrenal insufficiency-stable. 4. parkinsonism- stable. 5. dysphagia-continue tube feeds. 6. allergic rhinitis-well-controlled. 7. check liver  profile and Dilantin level.  CPT CODE: 62952

## 2012-10-09 ENCOUNTER — Non-Acute Institutional Stay (SKILLED_NURSING_FACILITY): Payer: Medicare Other | Admitting: Internal Medicine

## 2012-10-09 DIAGNOSIS — G20 Parkinson's disease: Secondary | ICD-10-CM

## 2012-10-09 DIAGNOSIS — E2749 Other adrenocortical insufficiency: Secondary | ICD-10-CM | POA: Diagnosis not present

## 2012-10-09 DIAGNOSIS — D509 Iron deficiency anemia, unspecified: Secondary | ICD-10-CM | POA: Diagnosis not present

## 2012-10-09 DIAGNOSIS — G20A1 Parkinson's disease without dyskinesia, without mention of fluctuations: Secondary | ICD-10-CM

## 2012-10-09 DIAGNOSIS — E2740 Unspecified adrenocortical insufficiency: Secondary | ICD-10-CM

## 2012-10-09 DIAGNOSIS — R569 Unspecified convulsions: Secondary | ICD-10-CM | POA: Diagnosis not present

## 2012-10-09 NOTE — Progress Notes (Signed)
PROGRESS NOTE  DATE: 10-09-12  FACILITY: Nursing Home Location: [facility]  LEVEL OF CARE: SNF (31)  Routine Visit  CHIEF COMPLAINT:  Manage seizure disorder and iron deficiency anemia  HISTORY OF PRESENT ILLNESS:  REASSESSMENT OF ONGOING PROBLEM(S):  ANEMIA: The anemia has been stable. The staff deny fatigue, melena or hematochezia. No complications from the medications currently being used. Patient is a poor historian. Hemoglobin 10.7, MCV 74.1 in 4/14,  In  5-14 hemoglobin 11.9, MCV 74.9.  SEIZURE DISORDER: The patient's seizure disorder remains stable. No complications reported from the medications presently being used. Staff do not report any recent seizure activity.  PAST MEDICAL HISTORY : Reviewed.  No changes.  CURRENT MEDICATIONS: Reviewed per [facility]  REVIEW OF SYSTEMS: Unobtainable due to patient being a poor historian  PHYSICAL EXAMINATION  VS:  T 98.3       P 75      RR 19      BP 125/72     POX %     WT (Lb) 154.6  GENERAL: no acute distress, thin body habitus NECK: supple, trachea midline, no neck masses, no thyroid tenderness, no thyromegaly RESPIRATORY: breathing is even & unlabored, BS CTAB CARDIAC: RRR, no murmur,no extra heart sounds, no edema GI: abdomen soft, normal BS, no masses, no tenderness, no hepatomegaly, no splenomegaly, patient has a PEG PSYCHIATRIC: the patient is alert & oriented to person, affect & behavior appropriate  LABS/RADIOLOGY:  6/14 phosgene lipid panel normal, Dilantin level 15.6  5-14 BMP normal, hemoglobin 11.9, MCV 74.9 otherwise CBC normal  4/14 hemoglobin 10.7, MCV 74.1 otherwise CBC normal, glucose 116 otherwise BMP normal 12/13 Dilantin 16.2 11/13 alkaline phosphatase 171 otherwise liver profile normal  ASSESSMENT/PLAN:  1. seizure disorder-well-controlled. 2. iron deficiency anemia-stable. 3. adrenal insufficiency-stable. 4. parkinsonism- stable. 5. dysphagia-continue tube feeds. 6.  allergic rhinitis-well-controlled. 7. check liver profile.  CPT CODE: 16109

## 2012-10-11 DIAGNOSIS — R945 Abnormal results of liver function studies: Secondary | ICD-10-CM | POA: Diagnosis not present

## 2012-11-20 DIAGNOSIS — H2510 Age-related nuclear cataract, unspecified eye: Secondary | ICD-10-CM | POA: Diagnosis not present

## 2012-11-20 DIAGNOSIS — H4320 Crystalline deposits in vitreous body, unspecified eye: Secondary | ICD-10-CM | POA: Diagnosis not present

## 2012-11-20 DIAGNOSIS — IMO0002 Reserved for concepts with insufficient information to code with codable children: Secondary | ICD-10-CM | POA: Diagnosis not present

## 2012-11-20 DIAGNOSIS — H1010 Acute atopic conjunctivitis, unspecified eye: Secondary | ICD-10-CM | POA: Diagnosis not present

## 2012-11-27 ENCOUNTER — Non-Acute Institutional Stay (SKILLED_NURSING_FACILITY): Payer: Medicare Other | Admitting: Internal Medicine

## 2012-11-27 DIAGNOSIS — R569 Unspecified convulsions: Secondary | ICD-10-CM

## 2012-11-27 DIAGNOSIS — G20 Parkinson's disease: Secondary | ICD-10-CM | POA: Diagnosis not present

## 2012-11-27 DIAGNOSIS — D509 Iron deficiency anemia, unspecified: Secondary | ICD-10-CM | POA: Diagnosis not present

## 2012-11-27 DIAGNOSIS — E2740 Unspecified adrenocortical insufficiency: Secondary | ICD-10-CM

## 2012-11-27 DIAGNOSIS — E2749 Other adrenocortical insufficiency: Secondary | ICD-10-CM

## 2012-11-28 NOTE — Progress Notes (Signed)
PROGRESS NOTE  DATE: 11-27-12  FACILITY: Nursing Home Location: [facility]  LEVEL OF CARE: SNF (31)  Routine Visit  CHIEF COMPLAINT:  Manage seizure disorder and iron deficiency anemia  HISTORY OF PRESENT ILLNESS:  REASSESSMENT OF ONGOING PROBLEM(S):  ANEMIA: The anemia has been stable. The staff deny fatigue, melena or hematochezia. No complications from the medications currently being used. Patient is a poor historian. Hemoglobin 10.7, MCV 74.1 in 4/14,  In  5-14 hemoglobin 11.9, MCV 74.9.  SEIZURE DISORDER: The patient's seizure disorder remains stable. No complications reported from the medications presently being used. Staff do not report any recent seizure activity.  PAST MEDICAL HISTORY : Reviewed.  No changes.  CURRENT MEDICATIONS: Reviewed per [facility]  REVIEW OF SYSTEMS: Unobtainable due to patient being a poor historian  PHYSICAL EXAMINATION  VS:  T 97.2       P 71      RR 18      BP 143/87     POX %     WT (Lb) 157.2  GENERAL: no acute distress, thin body habitus NECK: supple, trachea midline, no neck masses, no thyroid tenderness, no thyromegaly RESPIRATORY: breathing is even & unlabored, BS CTAB CARDIAC: RRR, no murmur,no extra heart sounds, no edema GI: abdomen soft, normal BS, no masses, no tenderness, no hepatomegaly, no splenomegaly, patient has a PEG PSYCHIATRIC: the patient is alert & oriented to person, affect & behavior appropriate  LABS/RADIOLOGY:  7-14 alkaline phosphatase 128 otherwise liver profile normal  6/14 fasting lipid panel normal, Dilantin level 15.6  5-14 BMP normal, hemoglobin 11.9, MCV 74.9 otherwise CBC normal  4/14 hemoglobin 10.7, MCV 74.1 otherwise CBC normal, glucose 116 otherwise BMP normal 12/13 Dilantin 16.2 11/13 alkaline phosphatase 171 otherwise liver profile normal  ASSESSMENT/PLAN:  seizure disorder-well-controlled. iron deficiency anemia-stable. adrenal  insufficiency-stable. parkinsonism- stable. dysphagia-continue tube feeds. allergic rhinitis-well-controlled.  CPT CODE: 47829

## 2012-11-30 ENCOUNTER — Non-Acute Institutional Stay (SKILLED_NURSING_FACILITY): Payer: Medicare Other | Admitting: Internal Medicine

## 2012-11-30 DIAGNOSIS — K9422 Gastrostomy infection: Secondary | ICD-10-CM | POA: Diagnosis not present

## 2012-11-30 DIAGNOSIS — N390 Urinary tract infection, site not specified: Secondary | ICD-10-CM | POA: Diagnosis not present

## 2012-11-30 DIAGNOSIS — K9429 Other complications of gastrostomy: Secondary | ICD-10-CM | POA: Diagnosis not present

## 2012-11-30 NOTE — Progress Notes (Signed)
Patient ID: [name], male   DOB: 10-29-1955, 57 y.o.   MRN: [id number] Facility [facility] Chief complaint; PEG tube issues, dysuria ?  History; [name] is a 57 year old man who has quadriplegia and  CNS injury secondary to trauma which is remote. He is PEG tube dependent. I have been asked to look at his PEG tube today which has a malodorous drainage, as well as a rash. His mother was present also thinks that he might have a UTI based on complaints of dysuria and malodorous urine  Review of systems is really not possible in this man secondary to dysarthria however he is not febrile and does not appear to be systemically unwell  Physical exam Abdomen; no liver no spleen no tenderness is noted. He is gastrostomy site is a bit unusual. I believe he has had some hyper granulation tissue that is formed around the actual tube itself. This actually has epithelialized although there is a small open area at the surface of this. There is some deep some drainage and I have done a deep culture around the tube. There does not appear to be any tenderness however there is what looks to be a fungal rash around the gastrostomy site. GU; there is no evidence of bladder distention or tenderness no CVA tenderness.  Impression/plan #1 gastrostomy site issues I think this is a candidal rash around the site and I will apply ketoconazole 2% with a covering zinc oxide. To the tip of the hyper granulation tissue Polysporin. I have done a CNS of the area but we'll not start him on appear antibiotics #2 question UTI; simply ordered a urine culture at this point

## 2012-12-01 ENCOUNTER — Emergency Department (HOSPITAL_COMMUNITY): Payer: Medicare Other

## 2012-12-01 ENCOUNTER — Emergency Department (HOSPITAL_COMMUNITY)
Admission: EM | Admit: 2012-12-01 | Discharge: 2012-12-01 | Disposition: A | Discharge status: Home / Self Care | Payer: Medicare Other | Attending: Emergency Medicine | Admitting: Emergency Medicine

## 2012-12-01 DIAGNOSIS — Z8669 Personal history of other diseases of the nervous system and sense organs: Secondary | ICD-10-CM | POA: Insufficient documentation

## 2012-12-01 DIAGNOSIS — T819XXA Unspecified complication of procedure, initial encounter: Secondary | ICD-10-CM | POA: Diagnosis not present

## 2012-12-01 DIAGNOSIS — T859XXA Unspecified complication of internal prosthetic device, implant and graft, initial encounter: Secondary | ICD-10-CM | POA: Diagnosis not present

## 2012-12-01 DIAGNOSIS — Z4509 Encounter for adjustment and management of other cardiac device: Secondary | ICD-10-CM | POA: Diagnosis not present

## 2012-12-01 DIAGNOSIS — R109 Unspecified abdominal pain: Secondary | ICD-10-CM | POA: Diagnosis not present

## 2012-12-01 DIAGNOSIS — G40909 Epilepsy, unspecified, not intractable, without status epilepticus: Secondary | ICD-10-CM | POA: Insufficient documentation

## 2012-12-01 DIAGNOSIS — Z880 Allergy status to penicillin: Secondary | ICD-10-CM | POA: Diagnosis not present

## 2012-12-01 DIAGNOSIS — G8250 Quadriplegia, unspecified: Secondary | ICD-10-CM | POA: Diagnosis not present

## 2012-12-01 DIAGNOSIS — Z79899 Other long term (current) drug therapy: Secondary | ICD-10-CM | POA: Diagnosis not present

## 2012-12-01 DIAGNOSIS — Z8659 Personal history of other mental and behavioral disorders: Secondary | ICD-10-CM | POA: Diagnosis not present

## 2012-12-01 DIAGNOSIS — G20A1 Parkinson's disease without dyskinesia, without mention of fluctuations: Secondary | ICD-10-CM | POA: Insufficient documentation

## 2012-12-01 DIAGNOSIS — D649 Anemia, unspecified: Secondary | ICD-10-CM | POA: Diagnosis not present

## 2012-12-01 DIAGNOSIS — Z862 Personal history of diseases of the blood and blood-forming organs and certain disorders involving the immune mechanism: Secondary | ICD-10-CM | POA: Diagnosis not present

## 2012-12-01 DIAGNOSIS — G20 Parkinson's disease: Secondary | ICD-10-CM | POA: Diagnosis not present

## 2012-12-01 DIAGNOSIS — K9423 Gastrostomy malfunction: Secondary | ICD-10-CM | POA: Insufficient documentation

## 2012-12-01 DIAGNOSIS — R633 Feeding difficulties: Secondary | ICD-10-CM | POA: Diagnosis not present

## 2012-12-01 DIAGNOSIS — Z8639 Personal history of other endocrine, nutritional and metabolic disease: Secondary | ICD-10-CM | POA: Insufficient documentation

## 2012-12-01 DIAGNOSIS — Z431 Encounter for attention to gastrostomy: Secondary | ICD-10-CM

## 2012-12-01 DIAGNOSIS — Y833 Surgical operation with formation of external stoma as the cause of abnormal reaction of the patient, or of later complication, without mention of misadventure at the time of the procedure: Secondary | ICD-10-CM | POA: Insufficient documentation

## 2012-12-01 MED ORDER — IOHEXOL 300 MG/ML  SOLN
30.0000 mL | Freq: Once | INTRAMUSCULAR | Status: AC | PRN
  Administered 2012-12-01: 30 mL via ORAL

## 2012-12-01 NOTE — Progress Notes (Addendum)
Report  Called to [employer] no questions at this time, awaiting on [facility] for transportation back to facility

## 2012-12-01 NOTE — ED Notes (Signed)
Pt from [facility]. PEG tube came today and staff place foley in. Pt came in by EMS.

## 2012-12-01 NOTE — ED Notes (Signed)
Patient transported to X-ray 

## 2012-12-01 NOTE — ED Provider Notes (Signed)
CSN: [id number]     Arrival date & time 12/01/12  1132 History   First MD Initiated Contact with Patient 12/01/12 1156     Chief Complaint  Patient presents with  . Peg Tube     replacement   (Consider location/radiation/quality/duration/timing/severity/associated sxs/prior Treatment) HPI Comments: Patient brought in today from [facility] to have his PEG tube replaced.  Apparently the tube fell out this morning.  They have put a foley catheter in the site at this time.  According to the patient's wife the PEG tube has been in for a couple of months.  According to paperwork from the nursing home the patient has not had a fever.  He was only brought in to the ED to have the PEG tube replaced.  The history is provided by the patient.    Past Medical History  Diagnosis Date  . Quadriplegia   . Depression   . Anemia   . Parkinson's disease   . Seizures   . Adrenal insufficiency    Past Surgical History  Procedure Laterality Date  . Peg tube placement    . Peripherally inserted central catheter insertion     No family history on file. History  Substance Use Topics  . Smoking status: Never Smoker   . Smokeless tobacco: Never Used  . Alcohol Use: No    Review of Systems  Unable to perform ROS: Patient nonverbal    Allergies  Aspirin; Nsaids; and Penicillins  Home Medications   Current Outpatient Rx  Name  Route  Sig  Dispense  Refill  . acetaminophen (TYLENOL) 650 MG suppository   Rectal   Place 650 mg rectally every 4 (four) hours as needed for pain.         . baclofen (LIORESAL) 10 MG tablet   Tube   Give 10 mg by tube 3 (three) times daily.         . carbidopa-levodopa (SINEMET) 25-100 MG per tablet   PEG Tube   1 tablet by PEG Tube route 3 (three) times daily.          . cetirizine (ZYRTEC) 5 MG tablet   PEG Tube   5 mg by PEG Tube route every morning.          . docusate (COLACE) 50 MG/5ML liquid   Tube   Give 100 mg by tube 2 (two)  times daily as needed (constipation).          Marland Kitchen esomeprazole (NEXIUM) 20 MG capsule   Tube   Give 20 mg by tube daily before breakfast. Give 1 capsule via tube (do not crush). Mix in 50mL H2O, shake well, give per tube immediately and flush tube afterward.         . ferrous sulfate 220 (44 FE) MG/5ML solution   PEG Tube   330 mg by PEG Tube route every morning.          . fludrocortisone (FLORINEF) 0.1 MG tablet   PEG Tube   0.1 mg by PEG Tube route every evening. Adrenocortical Insufficiency         . folic acid (FOLVITE) 1 MG tablet   PEG Tube   1 mg by PEG Tube route every morning.          Marland Kitchen ipratropium-albuterol (DUONEB) 0.5-2.5 (3) MG/3ML SOLN   Nebulization   Take 3 mLs by nebulization every 6 (six) hours as needed (for shortness of breath). For shortness of breath         .  Nutritional Supplements (TWOCAL HN) LIQD   Tube   Give 1 Can by tube continuous. Run@ 55cc/hr for 20 hrs. Turn off pump 1 hour before and keep off until 1 hour after phenytoin dose         . olopatadine (PATANOL) 0.1 % ophthalmic solution   Both Eyes   Place 1 drop into both eyes every morning.          . phenytoin (DILANTIN) 125 MG/5ML suspension   Tube   Give 100 mg by tube 2 (two) times daily.         . polyethylene glycol (MIRALAX / GLYCOLAX) packet   PEG Tube   17 g by PEG Tube route 2 (two) times daily.          . polyvinyl alcohol (LIQUIFILM TEARS) 1.4 % ophthalmic solution   Both Eyes   Place 1 drop into both eyes daily.          . predniSONE (DELTASONE) 10 MG tablet   Tube   Give 10 mg by tube every morning.         . saccharomyces boulardii (FLORASTOR) 250 MG capsule   Tube   Give 250 mg by tube 2 (two) times daily.          BP 115/63  Pulse 78  Temp(Src) 98.2 F (36.8 C) (Oral)  Resp 20  SpO2 98% Physical Exam  Nursing note and vitals reviewed. Constitutional: He appears well-developed and well-nourished.  HENT:  Head: Normocephalic and  atraumatic.  Cardiovascular: Normal rate, regular rhythm and normal heart sounds.   Pulmonary/Chest: Effort normal and breath sounds normal.  Abdominal:  Foley catheter in place at the PEG tube site.  Granulation tissue present at site.  No surrounding edema or erythema.  No drainage from the site.  Neurological: He is alert.  Skin: Skin is warm and dry.  Psychiatric: He has a normal mood and affect.    ED Course  Gastrostomy tube replacement Date/Time: 12/01/2012 3:55 PM Performed by: Anne Shutter, Trivia Heffelfinger Authorized by: Anne Shutter, Herbert Seta Consent: Verbal consent obtained. Consent given by: spouse Patient identity confirmed: verbally with patient and arm band Local anesthesia used: no Patient sedated: no Patient tolerance: Patient tolerated the procedure well with no immediate complications.   (including critical care time) Labs Review Labs Reviewed - No data to display Imaging Review Dg Abd 1 View  12/01/2012   *RADIOLOGY REPORT*  Clinical Data: Evaluate peg tube placement  ABDOMEN - 1 VIEW  Comparison: 10/11/2011; CT abdomen pelvis - 06/15/2012  Findings:  Contrast injection of the existing gastrostomy tube opacifies the stomach.  There is passage of injected contrast from the stomach and into the proximal small bowel.  There is moderate gaseous distension of the sigmoid colon which appears mildly featureless.  The sigmoid colon measures approximately 6.4 cm in diameter.  Nondiagnostic evaluation for pneumoperitoneum secondary to supine positioning exclusion of lower thorax.  No definite pneumatosis or portal venous gas.  Osteopenia without definite acute osseous findings.  IMPRESSION: 1.   Contrast injection of gastrostomy tube appropriately opacifies the stomach.  2.  Moderate gaseous distension of the sigmoid colon, similar to remote examination performed 09/2011, and thus presumably representative of a chronic ileus.   Original Report Authenticated By: Tacey Ruiz, MD    MDM  No  diagnosis found. Patient presented to the ED today to have his PEG tube replaced.  Apparently the PEG tube fell out this morning.  Nursing home had put  a Foley Catheter in its place.  PEG tube replaced in the ED without difficulty.  Xray confirmed placement.  Patient is stable to return to the Nursing Home.    Pascal Lux [place], PA-C 12/02/12 [phone]

## 2012-12-05 ENCOUNTER — Non-Acute Institutional Stay (SKILLED_NURSING_FACILITY): Payer: Medicare Other | Admitting: Internal Medicine

## 2012-12-05 DIAGNOSIS — N390 Urinary tract infection, site not specified: Secondary | ICD-10-CM | POA: Diagnosis not present

## 2012-12-05 NOTE — ED Provider Notes (Signed)
Medical screening examination/treatment/procedure(s) were performed by non-physician practitioner and as supervising physician I was immediately available for consultation/collaboration.  Juliet Rude. Rubin Payor, MD 12/05/12 [phone]

## 2012-12-06 DIAGNOSIS — I10 Essential (primary) hypertension: Secondary | ICD-10-CM | POA: Diagnosis not present

## 2012-12-10 DIAGNOSIS — Z5181 Encounter for therapeutic drug level monitoring: Secondary | ICD-10-CM | POA: Diagnosis not present

## 2012-12-11 DIAGNOSIS — Z79899 Other long term (current) drug therapy: Secondary | ICD-10-CM | POA: Diagnosis not present

## 2012-12-20 DIAGNOSIS — I10 Essential (primary) hypertension: Secondary | ICD-10-CM | POA: Diagnosis not present

## 2012-12-25 DIAGNOSIS — B351 Tinea unguium: Secondary | ICD-10-CM | POA: Diagnosis not present

## 2012-12-25 DIAGNOSIS — M79609 Pain in unspecified limb: Secondary | ICD-10-CM | POA: Diagnosis not present

## 2012-12-25 DIAGNOSIS — R262 Difficulty in walking, not elsewhere classified: Secondary | ICD-10-CM | POA: Diagnosis not present

## 2012-12-25 DIAGNOSIS — I739 Peripheral vascular disease, unspecified: Secondary | ICD-10-CM | POA: Diagnosis not present

## 2012-12-31 NOTE — Progress Notes (Signed)
Patient ID: [name], male   DOB: [birth year], 56 y.o.   MRN: [id number]        PROGRESS NOTE  DATE: 12/05/2012  FACILITY:  [name]  LEVEL OF CARE: SNF (31)  Acute Visit  CHIEF COMPLAINT:  Manage UTI.    HISTORY OF PRESENT ILLNESS: I was requested by the staff to assess the patient regarding above problem(s):  On 12/05/2012, patient's urinalysis shows large leukocyte esterase, WBCs 7-10, and RBCs 3-6.  Urine culture grows Proteus mirabilis significantly.  The organism is only sensitive to penicillin and derivatives, which the patient is allergic to.  Patient does not follow commands.    PAST MEDICAL HISTORY : Reviewed.  No changes.  CURRENT MEDICATIONS: Reviewed per [facility]  REVIEW OF SYSTEMS:  Unobtainable due to patient not following commands.     PHYSICAL EXAMINATION  GENERAL: no acute distress, normal body habitus NECK: supple, trachea midline, no neck masses, no thyroid tenderness, no thyromegaly RESPIRATORY: breathing is even & unlabored, BS CTAB CARDIAC: RRR, no murmur,no extra heart sounds, no edema GI: abdomen soft, normal BS, no masses, no tenderness, no hepatomegaly, no splenomegaly, patient has a PEG   PSYCHIATRIC: the patient is alert, unable to assess orientation, affect & behavior appropriate  ASSESSMENT/PLAN:  UTI.  New problem.  Since patient is allergic to penicillins, we will request pharmacist to recommend an appropriate antibiotic from the sensitivities.    CPT CODE: 40981

## 2013-01-01 ENCOUNTER — Non-Acute Institutional Stay (SKILLED_NURSING_FACILITY): Payer: Medicare Other | Admitting: Internal Medicine

## 2013-01-01 DIAGNOSIS — E2749 Other adrenocortical insufficiency: Secondary | ICD-10-CM | POA: Diagnosis not present

## 2013-01-01 DIAGNOSIS — E2740 Unspecified adrenocortical insufficiency: Secondary | ICD-10-CM

## 2013-01-01 DIAGNOSIS — R569 Unspecified convulsions: Secondary | ICD-10-CM

## 2013-01-01 DIAGNOSIS — D509 Iron deficiency anemia, unspecified: Secondary | ICD-10-CM

## 2013-01-01 DIAGNOSIS — G20 Parkinson's disease: Secondary | ICD-10-CM

## 2013-01-11 NOTE — Progress Notes (Signed)
PROGRESS NOTE  DATE: 01-01-13  FACILITY: Nursing Home Location: [facility]  LEVEL OF CARE: SNF (31)  Routine Visit  CHIEF COMPLAINT:  Manage seizure disorder and iron deficiency anemia  HISTORY OF PRESENT ILLNESS:  REASSESSMENT OF ONGOING PROBLEM(S):  ANEMIA: The anemia has been stable. The staff deny fatigue, melena or hematochezia. No complications from the medications currently being used. Patient is a poor historian. Hemoglobin 10.7, MCV 74.1 in 4/14,  In  5-14 hemoglobin 11.9, MCV 74.9.  SEIZURE DISORDER: The patient's seizure disorder remains stable. No complications reported from the medications presently being used. Staff do not report any recent seizure activity.  PAST MEDICAL HISTORY : Reviewed.  No changes.  CURRENT MEDICATIONS: Reviewed per [facility]  REVIEW OF SYSTEMS: Unobtainable due to patient being a poor historian  PHYSICAL EXAMINATION  VS:  T 98.6      P 76     RR 20      BP 108/80     POX %     WT (Lb) 158.4  GENERAL: no acute distress, thin body habitus NECK: supple, trachea midline, no neck masses, no thyroid tenderness, no thyromegaly RESPIRATORY: breathing is even & unlabored, BS CTAB CARDIAC: RRR, no murmur,no extra heart sounds, no edema GI: abdomen soft, normal BS, no masses, no tenderness, no hepatomegaly, no splenomegaly, patient has a PEG PSYCHIATRIC: the patient is alert & oriented to person, affect & behavior appropriate  LABS/RADIOLOGY:  9-14 glc 106 ow bmp nl  7-14 alkaline phosphatase 128 otherwise liver profile normal  6/14 fasting lipid panel normal, Dilantin level 15.6  5-14 BMP normal, hemoglobin 11.9, MCV 74.9 otherwise CBC normal  4/14 hemoglobin 10.7, MCV 74.1 otherwise CBC normal, glucose 116 otherwise BMP normal 12/13 Dilantin 16.2 11/13 alkaline phosphatase 171 otherwise liver profile normal  ASSESSMENT/PLAN:  seizure disorder-well-controlled. iron deficiency anemia-stable. adrenal  insufficiency-stable. parkinsonism- stable. dysphagia-continue tube feeds. allergic rhinitis-well-controlled.  CPT CODE: 29562

## 2013-02-10 DIAGNOSIS — R1084 Generalized abdominal pain: Secondary | ICD-10-CM | POA: Diagnosis not present

## 2013-02-11 ENCOUNTER — Non-Acute Institutional Stay (SKILLED_NURSING_FACILITY): Payer: Medicare Other | Admitting: Internal Medicine

## 2013-02-11 DIAGNOSIS — R569 Unspecified convulsions: Secondary | ICD-10-CM

## 2013-02-11 DIAGNOSIS — G20 Parkinson's disease: Secondary | ICD-10-CM | POA: Diagnosis not present

## 2013-02-11 DIAGNOSIS — E2749 Other adrenocortical insufficiency: Secondary | ICD-10-CM

## 2013-02-11 DIAGNOSIS — D509 Iron deficiency anemia, unspecified: Secondary | ICD-10-CM | POA: Diagnosis not present

## 2013-02-11 DIAGNOSIS — E2740 Unspecified adrenocortical insufficiency: Secondary | ICD-10-CM

## 2013-02-13 ENCOUNTER — Encounter: Payer: Self-pay | Admitting: Internal Medicine

## 2013-02-13 DIAGNOSIS — D649 Anemia, unspecified: Secondary | ICD-10-CM | POA: Diagnosis not present

## 2013-02-13 NOTE — Progress Notes (Signed)
PROGRESS NOTE  DATE: 02-11-13  FACILITY: Nursing Home Location: [facility]  LEVEL OF CARE: SNF (31)  Routine Visit  CHIEF COMPLAINT:  Manage seizure disorder and iron deficiency anemia  HISTORY OF PRESENT ILLNESS:  REASSESSMENT OF ONGOING PROBLEM(S):  ANEMIA: The anemia has been stable. The staff deny fatigue, melena or hematochezia. No complications from the medications currently being used. Patient is a poor historian. Hemoglobin 10.7, MCV 74.1 in 4/14,  In  5-14 hemoglobin 11.9, MCV 74.9.  SEIZURE DISORDER: The patient's seizure disorder remains stable. No complications reported from the medications presently being used. Staff do not report any recent seizure activity.  PAST MEDICAL HISTORY : Reviewed.  No changes.  CURRENT MEDICATIONS: Reviewed per [facility]  REVIEW OF SYSTEMS: Unobtainable due to patient being a poor historian  PHYSICAL EXAMINATION  VS:  T 98.3      P 76     RR 20      BP 101/62    POX %     WT (Lb) 156.4  GENERAL: no acute distress, thin body habitus NECK: supple, trachea midline, no neck masses, no thyroid tenderness, no thyromegaly RESPIRATORY: breathing is even & unlabored, BS CTAB CARDIAC: RRR, no murmur,no extra heart sounds, no edema GI: abdomen soft, normal BS, no masses, no tenderness, no hepatomegaly, no splenomegaly, patient has a PEG PSYCHIATRIC: the patient is alert & oriented to person, affect & behavior appropriate  LABS/RADIOLOGY:  9-14 glc 106 ow bmp nl  7-14 alkaline phosphatase 128 otherwise liver profile normal  6/14 fasting lipid panel normal, Dilantin level 15.6  5-14 BMP normal, hemoglobin 11.9, MCV 74.9 otherwise CBC normal  4/14 hemoglobin 10.7, MCV 74.1 otherwise CBC normal, glucose 116 otherwise BMP normal 12/13 Dilantin 16.2 11/13 alkaline phosphatase 171 otherwise liver profile normal  ASSESSMENT/PLAN:  seizure disorder-well-controlled. iron deficiency anemia-stable. Recheck adrenal  insufficiency-stable. parkinsonism- stable. dysphagia-continue tube feeds. allergic rhinitis-well-controlled. Check CBC  CPT CODE: 78295

## 2013-02-15 DIAGNOSIS — D649 Anemia, unspecified: Secondary | ICD-10-CM | POA: Diagnosis not present

## 2013-03-11 ENCOUNTER — Non-Acute Institutional Stay (SKILLED_NURSING_FACILITY): Payer: Medicare Other | Admitting: Internal Medicine

## 2013-03-11 ENCOUNTER — Encounter: Payer: Self-pay | Admitting: Internal Medicine

## 2013-03-11 DIAGNOSIS — E2749 Other adrenocortical insufficiency: Secondary | ICD-10-CM | POA: Diagnosis not present

## 2013-03-11 DIAGNOSIS — D509 Iron deficiency anemia, unspecified: Secondary | ICD-10-CM

## 2013-03-11 DIAGNOSIS — R569 Unspecified convulsions: Secondary | ICD-10-CM

## 2013-03-11 DIAGNOSIS — G20 Parkinson's disease: Secondary | ICD-10-CM | POA: Diagnosis not present

## 2013-03-11 DIAGNOSIS — E2740 Unspecified adrenocortical insufficiency: Secondary | ICD-10-CM

## 2013-03-11 NOTE — Progress Notes (Signed)
PROGRESS NOTE  DATE: 03-11-13  FACILITY: Nursing Home Location: [facility]  LEVEL OF CARE: SNF (31)  Routine Visit  CHIEF COMPLAINT:  Manage seizure disorder and iron deficiency anemia  HISTORY OF PRESENT ILLNESS:  REASSESSMENT OF ONGOING PROBLEM(S):  ANEMIA: The anemia has been stable. The staff deny fatigue, melena or hematochezia. No complications from the medications currently being used. Patient is a poor historian. Hemoglobin 10.7, MCV 74.1 in 4/14,  In  5-14 hemoglobin 11.9, MCV 74.9, in 11-14 hemoglobin 10.4, MCV 73.9  SEIZURE DISORDER: The patient's seizure disorder remains stable. No complications reported from the medications presently being used. Staff do not report any recent seizure activity.  PAST MEDICAL HISTORY : Reviewed.  No changes.  CURRENT MEDICATIONS: Reviewed per [facility]  REVIEW OF SYSTEMS: Unobtainable due to patient being a poor historian  PHYSICAL EXAMINATION  VS:  T 97.2     P 74     RR 16      BP 101/62    POX %     WT (Lb) 155.4  GENERAL: no acute distress, thin body habitus NECK: supple, trachea midline, no neck masses, no thyroid tenderness, no thyromegaly RESPIRATORY: breathing is even & unlabored, BS CTAB CARDIAC: RRR, no murmur,no extra heart sounds, no edema GI: abdomen soft, normal BS, no masses, no tenderness, no hepatomegaly, no splenomegaly, patient has a PEG PSYCHIATRIC: the patient is alert & oriented to person, affect & behavior appropriate  LABS/RADIOLOGY:  11-14 hemoglobin 10.4, MCV 73.9 otherwise CBC normal, serum iron 40, TIBC 227, Percent saturation 18, ferritin 229  9-14 glc 106 ow bmp nl  7-14 alkaline phosphatase 128 otherwise liver profile normal  6/14 fasting lipid panel normal, Dilantin level 15.6  5-14 BMP normal, hemoglobin 11.9, MCV 74.9 otherwise CBC normal  4/14 hemoglobin 10.7, MCV 74.1 otherwise CBC normal, glucose 116 otherwise BMP normal 12/13 Dilantin 16.2 11/13 alkaline  phosphatase 171 otherwise liver profile normal  ASSESSMENT/PLAN:  seizure disorder-well-controlled. iron deficiency anemia-stable. adrenal insufficiency-stable. parkinsonism- stable. dysphagia-continue tube feeds. allergic rhinitis-well-controlled.  CPT CODE: 16109

## 2013-03-12 DIAGNOSIS — G8250 Quadriplegia, unspecified: Secondary | ICD-10-CM | POA: Diagnosis not present

## 2013-03-12 DIAGNOSIS — B351 Tinea unguium: Secondary | ICD-10-CM | POA: Diagnosis not present

## 2013-04-11 DIAGNOSIS — N390 Urinary tract infection, site not specified: Secondary | ICD-10-CM | POA: Diagnosis not present

## 2013-04-13 ENCOUNTER — Non-Acute Institutional Stay (SKILLED_NURSING_FACILITY): Payer: Medicare Other | Admitting: Internal Medicine

## 2013-04-13 DIAGNOSIS — N390 Urinary tract infection, site not specified: Secondary | ICD-10-CM

## 2013-04-14 ENCOUNTER — Emergency Department (HOSPITAL_COMMUNITY)
Admission: EM | Admit: 2013-04-14 | Discharge: 2013-04-14 | Disposition: A | Discharge status: Home / Self Care | Payer: Medicare Other | Attending: Emergency Medicine | Admitting: Emergency Medicine

## 2013-04-14 ENCOUNTER — Emergency Department (HOSPITAL_COMMUNITY): Payer: Medicare Other

## 2013-04-14 DIAGNOSIS — E2749 Other adrenocortical insufficiency: Secondary | ICD-10-CM | POA: Insufficient documentation

## 2013-04-14 DIAGNOSIS — G20 Parkinson's disease: Secondary | ICD-10-CM | POA: Insufficient documentation

## 2013-04-14 DIAGNOSIS — D649 Anemia, unspecified: Secondary | ICD-10-CM | POA: Insufficient documentation

## 2013-04-14 DIAGNOSIS — N390 Urinary tract infection, site not specified: Secondary | ICD-10-CM | POA: Diagnosis not present

## 2013-04-14 DIAGNOSIS — F3289 Other specified depressive episodes: Secondary | ICD-10-CM | POA: Insufficient documentation

## 2013-04-14 DIAGNOSIS — G40909 Epilepsy, unspecified, not intractable, without status epilepticus: Secondary | ICD-10-CM | POA: Insufficient documentation

## 2013-04-14 DIAGNOSIS — Z79899 Other long term (current) drug therapy: Secondary | ICD-10-CM | POA: Diagnosis not present

## 2013-04-14 DIAGNOSIS — R509 Fever, unspecified: Secondary | ICD-10-CM | POA: Diagnosis not present

## 2013-04-14 DIAGNOSIS — F329 Major depressive disorder, single episode, unspecified: Secondary | ICD-10-CM | POA: Insufficient documentation

## 2013-04-14 DIAGNOSIS — G8250 Quadriplegia, unspecified: Secondary | ICD-10-CM | POA: Insufficient documentation

## 2013-04-14 DIAGNOSIS — IMO0002 Reserved for concepts with insufficient information to code with codable children: Secondary | ICD-10-CM | POA: Diagnosis not present

## 2013-04-14 DIAGNOSIS — R6889 Other general symptoms and signs: Secondary | ICD-10-CM | POA: Diagnosis not present

## 2013-04-14 DIAGNOSIS — Z931 Gastrostomy status: Secondary | ICD-10-CM | POA: Insufficient documentation

## 2013-04-14 DIAGNOSIS — Z880 Allergy status to penicillin: Secondary | ICD-10-CM | POA: Insufficient documentation

## 2013-04-14 DIAGNOSIS — J811 Chronic pulmonary edema: Secondary | ICD-10-CM | POA: Diagnosis not present

## 2013-04-14 DIAGNOSIS — G20A1 Parkinson's disease without dyskinesia, without mention of fluctuations: Secondary | ICD-10-CM | POA: Insufficient documentation

## 2013-04-14 LAB — URINALYSIS, ROUTINE W REFLEX MICROSCOPIC
Bilirubin Urine: NEGATIVE
GLUCOSE, UA: NEGATIVE mg/dL
KETONES UR: NEGATIVE mg/dL
NITRITE: NEGATIVE
PH: 7 (ref 5.0–8.0)
Protein, ur: 30 mg/dL — AB
Specific Gravity, Urine: 1.025 (ref 1.005–1.030)
Urobilinogen, UA: 0.2 mg/dL (ref 0.0–1.0)

## 2013-04-14 LAB — CBC WITH DIFFERENTIAL/PLATELET
Basophils Absolute: 0 10*3/uL (ref 0.0–0.1)
Basophils Relative: 0 % (ref 0–1)
EOS ABS: 0.4 10*3/uL (ref 0.0–0.7)
EOS PCT: 6 % — AB (ref 0–5)
HCT: 32.5 % — ABNORMAL LOW (ref 39.0–52.0)
Hemoglobin: 10.6 g/dL — ABNORMAL LOW (ref 13.0–17.0)
Lymphocytes Relative: 42 % (ref 12–46)
Lymphs Abs: 2.9 10*3/uL (ref 0.7–4.0)
MCH: 24.1 pg — ABNORMAL LOW (ref 26.0–34.0)
MCHC: 32.6 g/dL (ref 30.0–36.0)
MCV: 73.9 fL — ABNORMAL LOW (ref 78.0–100.0)
MONO ABS: 0.7 10*3/uL (ref 0.1–1.0)
Monocytes Relative: 11 % (ref 3–12)
NEUTROS PCT: 41 % — AB (ref 43–77)
Neutro Abs: 2.7 10*3/uL (ref 1.7–7.7)
Platelets: 252 10*3/uL (ref 150–400)
RBC: 4.4 MIL/uL (ref 4.22–5.81)
RDW: 13.5 % (ref 11.5–15.5)
WBC: 6.7 10*3/uL (ref 4.0–10.5)

## 2013-04-14 LAB — BASIC METABOLIC PANEL
BUN: 18 mg/dL (ref 6–23)
CALCIUM: 8.7 mg/dL (ref 8.4–10.5)
CO2: 26 meq/L (ref 19–32)
Chloride: 97 mEq/L (ref 96–112)
Creatinine, Ser: 0.54 mg/dL (ref 0.50–1.35)
GFR calc Af Amer: >90 mL/min (ref >90)
GFR calc non Af Amer: >90 mL/min (ref >90)
Glucose, Bld: 85 mg/dL (ref 70–99)
Potassium: 4 mEq/L (ref 3.7–5.3)
SODIUM: 136 meq/L — AB (ref 137–147)

## 2013-04-14 LAB — LACTIC ACID, PLASMA: Lactic Acid, Venous: 1 mmol/L (ref 0.5–2.2)

## 2013-04-14 LAB — URINE MICROSCOPIC-ADD ON

## 2013-04-14 LAB — HEPATIC FUNCTION PANEL
ALBUMIN: 3.4 g/dL — AB (ref 3.5–5.2)
ALT: 16 U/L (ref 0–53)
AST: 24 U/L (ref 0–37)
Alkaline Phosphatase: 158 U/L — ABNORMAL HIGH (ref 39–117)
Bilirubin, Direct: <0.2 mg/dL (ref 0.0–0.3)
TOTAL PROTEIN: 7.9 g/dL (ref 6.0–8.3)
Total Bilirubin: <0.2 mg/dL — ABNORMAL LOW (ref 0.3–1.2)

## 2013-04-14 MED ORDER — CEPHALEXIN 500 MG PO CAPS: 500.0000 mg | ORAL_CAPSULE | Freq: Four times a day (QID) | ORAL | Status: DC

## 2013-04-14 MED ORDER — DEXTROSE 5 % IV SOLN
1.0000 g | Freq: Once | INTRAVENOUS | Status: AC
  Administered 2013-04-14: 1 g via INTRAVENOUS
  Filled 2013-04-14: qty 10

## 2013-04-14 MED ORDER — SODIUM CHLORIDE 0.9 % IV BOLUS (SEPSIS)
1000.0000 mL | Freq: Once | INTRAVENOUS | Status: AC
  Administered 2013-04-14: 1000 mL via INTRAVENOUS

## 2013-04-14 NOTE — ED Notes (Signed)
Pt was dx with UTI and started antibx Friday. Quadriplegic. Fever per [facility]. 100.8 fever today.  Given 650mg  tylenol suppository approx. 5:40pm. Evaluate for sepsis.

## 2013-04-14 NOTE — ED Provider Notes (Signed)
CSN: [id number]     Arrival date & time 04/14/13  1818 History   First MD Initiated Contact with Patient 04/14/13 1826     Chief Complaint  Patient presents with  . Fever  . Urinary Tract Infection   (Consider location/radiation/quality/duration/timing/severity/associated sxs/prior Treatment) HPI Comments: 58 year old male presents with his mother with fever intermittently for 3 weeks. Mom states that he has not had fevers every single day but did have a rectal temp of 100.8 at [facility] this morning. Patient is chronically incontinent of urine and wears diapers frequently has UTIs. The mom states that during the 3 weeks though urine has been darker and smelling different to she's concerned about a UTI. Mom is nothing the patient is on antibiotics per EMS states the patient was started on antibiotics recently. It is unknown what this antibiotic is. The patient is nonverbal and is hard to get any further history. The mom does note that the patient has not been complaining of abdominal pain or having cough. He has not been altered and is acting like he normally does.   Past Medical History  Diagnosis Date  . Quadriplegia   . Depression   . Anemia   . Parkinson's disease   . Seizures   . Adrenal insufficiency    Past Surgical History  Procedure Laterality Date  . Peg tube placement    . Peripherally inserted central catheter insertion     No family history on file. History  Substance Use Topics  . Smoking status: Never Smoker   . Smokeless tobacco: Never Used  . Alcohol Use: No    Review of Systems  Unable to perform ROS: Patient nonverbal  Constitutional: Positive for fever.    Allergies  Aspirin; Nsaids; and Penicillins  Home Medications   Current Outpatient Rx  Name  Route  Sig  Dispense  Refill  . acetaminophen (TYLENOL) 650 MG suppository   Rectal   Place 650 mg rectally every 4 (four) hours as needed for pain.         . baclofen (LIORESAL) 10 MG tablet    Tube   Give 10 mg by tube 3 (three) times daily.         . carbidopa-levodopa (SINEMET) 25-100 MG per tablet   PEG Tube   1 tablet by PEG Tube route 3 (three) times daily.          . cetirizine (ZYRTEC) 5 MG tablet   PEG Tube   5 mg by PEG Tube route every morning.          . docusate (COLACE) 50 MG/5ML liquid   Tube   Give 100 mg by tube 2 (two) times daily as needed (constipation).          Marland Kitchen esomeprazole (NEXIUM) 20 MG capsule   Tube   Give 20 mg by tube daily before breakfast. Give 1 capsule via tube (do not crush). Mix in 67mL H2O, shake well, give per tube immediately and flush tube afterward.         . ferrous sulfate 220 (44 FE) MG/5ML solution   PEG Tube   330 mg by PEG Tube route every morning.          . fludrocortisone (FLORINEF) 0.1 MG tablet   PEG Tube   0.1 mg by PEG Tube route every evening. Adrenocortical Insufficiency         . folic acid (FOLVITE) 1 MG tablet   PEG Tube   1 mg by  PEG Tube route every morning.          Marland Kitchen ipratropium-albuterol (DUONEB) 0.5-2.5 (3) MG/3ML SOLN   Nebulization   Take 3 mLs by nebulization every 6 (six) hours as needed (for shortness of breath). For shortness of breath         . Nutritional Supplements (TWOCAL HN) LIQD   Tube   Give 1 Can by tube continuous. Run@ 55cc/hr for 20 hrs. Turn off pump 1 hour before and keep off until 1 hour after phenytoin dose         . olopatadine (PATANOL) 0.1 % ophthalmic solution   Both Eyes   Place 1 drop into both eyes every morning.          . phenytoin (DILANTIN) 125 MG/5ML suspension   Tube   Give 100 mg by tube 2 (two) times daily.         . polyethylene glycol (MIRALAX / GLYCOLAX) packet   PEG Tube   17 g by PEG Tube route 2 (two) times daily.          . polyvinyl alcohol (LIQUIFILM TEARS) 1.4 % ophthalmic solution   Both Eyes   Place 1 drop into both eyes daily.          . predniSONE (DELTASONE) 10 MG tablet   Tube   Give 10 mg by tube every  morning.         . saccharomyces boulardii (FLORASTOR) 250 MG capsule   Tube   Give 250 mg by tube 2 (two) times daily.          There were no vitals taken for this visit. Physical Exam  Nursing note and vitals reviewed. Constitutional:  Chronically ill appearing  HENT:  Head: Normocephalic and atraumatic.  Right Ear: External ear normal.  Left Ear: External ear normal.  Nose: Nose normal.  Eyes: Right eye exhibits no discharge. Left eye exhibits no discharge.  Neck: Neck supple.  Cardiovascular: Normal rate, regular rhythm, normal heart sounds and intact distal pulses.   Pulmonary/Chest: Effort normal and breath sounds normal.  Abdominal: Soft. He exhibits no distension. There is no tenderness.  abd appears mildly distended but mom states this is normal  Musculoskeletal: He exhibits no edema.  Both arms contracted  Neurological: He is alert. He exhibits normal muscle tone.  Patient is nonverbal  Skin: Skin is warm and dry.    ED Course  Procedures (including critical care time) Labs Review Labs Reviewed  CBC WITH DIFFERENTIAL - Abnormal; Notable for the following:    Hemoglobin 10.6 (*)    HCT 32.5 (*)    MCV 73.9 (*)    MCH 24.1 (*)    Neutrophils Relative % 41 (*)    Eosinophils Relative 6 (*)    All other components within normal limits  BASIC METABOLIC PANEL - Abnormal; Notable for the following:    Sodium 136 (*)    All other components within normal limits  HEPATIC FUNCTION PANEL - Abnormal; Notable for the following:    Albumin 3.4 (*)    Alkaline Phosphatase 158 (*)    Total Bilirubin <0.2 (*)    All other components within normal limits  URINALYSIS, ROUTINE W REFLEX MICROSCOPIC - Abnormal; Notable for the following:    APPearance CLOUDY (*)    Hgb urine dipstick LARGE (*)    Protein, ur 30 (*)    Leukocytes, UA SMALL (*)    All other components within normal limits  URINE MICROSCOPIC-ADD  ON - Abnormal; Notable for the following:    Bacteria, UA  MANY (*)    All other components within normal limits  URINE CULTURE  LACTIC ACID, PLASMA   Imaging Review Dg Abd Acute W/chest  04/14/2013   CLINICAL DATA:  Fever  EXAM: ACUTE ABDOMEN SERIES (ABDOMEN 2 VIEW & CHEST 1 VIEW)  COMPARISON:  DG ABDOMEN 1V dated 12/01/2012; DG CHEST 1V PORT dated 06/14/2012  FINDINGS: Normal cardiac silhouette. Mild interstitial edema pattern noted. No free air beneath the hemidiaphragms.  Left lateral decubitus view demonstrates no intraperitoneal free air. There is gas throughout the colon. There is a large stool ball in the rectum. The sigmoid colon is dilated pneumonia similar pattern to prior exam of 9 / 08/2012.  IMPRESSION: 1. Mild interstitial edema. 2. No intraperitoneal free air or bowel obstruction. 3. Large stool ball in the rectum. 4. Chronic distension of the sigmoid colon.   Electronically Signed   By: Suzy Bouchard M.D.   On: 04/14/2013 19:10    EKG Interpretation   None       MDM   1. UTI (urinary tract infection)    Patient is afebrile and at his baseline here. Labs show no leukocytosis or renal issues. Has UTI. On review of past cultures, all his prior UTIs are resistant to cipro (what he is currently on for past 2 days) and sensitive to cephalosporins. Given IV dose of rocephin here, and will d/c with keflex. As he has fevers he may have early pyelo, but he has not truly failed abx as this was a poor initial choice. Will d/c back to nursing home, and can return if worsens. Discussed AAS results with mom, but do not feel he needs acute management of stool given he has been having normal BMs and has no pain.    Ephraim Hamburger, MD 04/15/13 0030

## 2013-04-14 NOTE — ED Notes (Signed)
Bed: WA02 Expected date: 04/14/13 Expected time: 6:16 PM Means of arrival: Ambulance Comments: fever

## 2013-04-15 ENCOUNTER — Non-Acute Institutional Stay (SKILLED_NURSING_FACILITY): Payer: Medicare Other | Admitting: Internal Medicine

## 2013-04-15 DIAGNOSIS — I10 Essential (primary) hypertension: Secondary | ICD-10-CM | POA: Diagnosis not present

## 2013-04-15 DIAGNOSIS — Z5181 Encounter for therapeutic drug level monitoring: Secondary | ICD-10-CM | POA: Diagnosis not present

## 2013-04-15 DIAGNOSIS — D649 Anemia, unspecified: Secondary | ICD-10-CM | POA: Diagnosis not present

## 2013-04-15 DIAGNOSIS — N390 Urinary tract infection, site not specified: Secondary | ICD-10-CM

## 2013-04-16 DIAGNOSIS — D649 Anemia, unspecified: Secondary | ICD-10-CM | POA: Diagnosis not present

## 2013-04-18 LAB — URINE CULTURE: Colony Count: >=100000

## 2013-04-19 NOTE — Progress Notes (Signed)
         PROGRESS NOTE  DATE: 04/15/2013  FACILITY:  [facility]  LEVEL OF CARE: SNF (31)  Acute Visit  CHIEF COMPLAINT:  Manage UTI  HISTORY OF PRESENT ILLNESS: I was requested by the staff to assess the patient regarding above problem(s):  UTI: On 04-15-13 urinalysis show moderate blood, nitrite negative, small leukocyte esterase, WBC greater than 50, RBCs 3-6. UA and culture grows Proteus mirabilis significantly.  Patient is a poor historian.  PAST MEDICAL HISTORY : Reviewed.  No changes.  CURRENT MEDICATIONS: Reviewed per [facility]  REVIEW OF SYSTEMS: unobtainable due to patient being a poor historian   PHYSICAL EXAMINATION  GENERAL: no acute distress, normal body habitus RESPIRATORY: breathing is even & unlabored, BS CTAB CARDIAC: RRR, no murmur,no extra heart sounds, no edema GI: abdomen soft, normal BS, no masses, no tenderness, no hepatomegaly, no splenomegaly PSYCHIATRIC: the patient is alert & oriented to person, affect & behavior appropriate  LABS/RADIOLOGY: see HPI   ASSESSMENT/PLAN:  UTI-new problem. Start Bactrim double strength one tablet twice a day for one week, probiotics twice a day for 10 days.   CPT CODE: 22297

## 2013-04-29 NOTE — Progress Notes (Addendum)
Patient ID: [name], male   DOB: Mar 04, 1956, 58 y.o.   MRN: [id number]               PROGRESS NOTE  DATE:  04/12/2013    FACILITY: [facility]    LEVEL OF CARE:   SNF   Acute Visit   CHIEF COMPLAINT:  Question UTI.    HISTORY OF PRESENT ILLNESS:  Mr. Markovic  apparently has been rubbing his lower abdomen in a vague fashion.  This led them to do a UA, C&S, although he is not running a fever.  His urine is described as malodorous and concentrated-looking.  I saw the urinalysis today.  This shows many bacteria, a small leukocyte esterase, moderate blood.  I have empirically given him Cipro.    PHYSICAL EXAMINATION:   GASTROINTESTINAL:  ABDOMEN:   His abdomen is somewhat distended.  However, bowel sounds are positive.  There are no localizing masses.    LIVER/SPLEEN/KIDNEYS:  No liver, no spleen.   GENITOURINARY:  BLADDER:   There is some suprapubic distention, although this did not seem dull.  There is no obvious tenderness.  No CVA tenderness.    ASSESSMENT/PLAN:  Question UTI.  I have given him empiric Cipro for now.  We will await final culture results.    Rule out distal impaction.  I will have the staff check this.

## 2013-05-02 DIAGNOSIS — J690 Pneumonitis due to inhalation of food and vomit: Secondary | ICD-10-CM | POA: Diagnosis not present

## 2013-05-03 ENCOUNTER — Non-Acute Institutional Stay (SKILLED_NURSING_FACILITY): Payer: Medicare Other | Admitting: Internal Medicine

## 2013-05-03 DIAGNOSIS — R141 Gas pain: Secondary | ICD-10-CM | POA: Diagnosis not present

## 2013-05-03 DIAGNOSIS — R143 Flatulence: Secondary | ICD-10-CM

## 2013-05-03 DIAGNOSIS — R142 Eructation: Secondary | ICD-10-CM

## 2013-05-03 DIAGNOSIS — K3189 Other diseases of stomach and duodenum: Secondary | ICD-10-CM | POA: Diagnosis not present

## 2013-05-03 DIAGNOSIS — R140 Abdominal distension (gaseous): Secondary | ICD-10-CM

## 2013-05-03 DIAGNOSIS — R112 Nausea with vomiting, unspecified: Secondary | ICD-10-CM

## 2013-05-03 DIAGNOSIS — N390 Urinary tract infection, site not specified: Secondary | ICD-10-CM | POA: Diagnosis not present

## 2013-05-03 NOTE — Progress Notes (Signed)
Patient ID: [name], male   DOB: 07-Jan-1956, 58 y.o.   MRN: [id number] Facility; Field seismologist complaint; 2 episodes of projectile vomiting yesterday  history agent is a 58 year old man with severe chronic neurologic damage secondary to CNS trauma remotely. He is total care PEG tube dependent dependent. He was noted by the staff yesterday to have 2 episodes of upper tract although vomiting. In this he became somewhat dysmetric and a chest x-ray was done suggesting pneumonia although I don't have this report. He is now on Avelox. He has not been unstable however I was stopped by his family in the hallway to check him  Review of systems; a very limited utility secondary to his dysarthric speech however I did not get the impression that he is complaining of shortness of breath cough chest pain or of normal pain. Nursing reports that they checked him for an impaction yesterday without any finding  Physical examination Gen. 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 His abdomen is distended however bowel sounds are positive there is no tenderness no liver no spleen no masses GU no suprapubic tenderness no CVA tenderness    Impression/plan #1 episodes of projectile vomiting yesterday. His feeding residuals were checked and were found to be within the normal limits of. He has no succussion splash today. His abdomen is distended however he has no evidence of a acute abdomen or obstruction. There is stool in the distal rectum. He has a history of UTIs Toprol we'll check this although there is no signs of this at the bedside  #2 right basilar infiltrate which might have been aspiration at the time of his vomiting. He was put on  quinolone. I am doubtful that he has an active pneumonitis at this point  The patient does not look to be unstable I will check her lab his lab work and the KUB. Continue to monitor in the facility at this point I don't think there is any reason to send him to the hospital although his family have been very anxious and aggressive with his intercurrent illnesses in the past.

## 2013-05-06 DIAGNOSIS — I10 Essential (primary) hypertension: Secondary | ICD-10-CM | POA: Diagnosis not present

## 2013-05-06 DIAGNOSIS — D649 Anemia, unspecified: Secondary | ICD-10-CM | POA: Diagnosis not present

## 2013-05-27 ENCOUNTER — Encounter: Payer: Self-pay | Admitting: Internal Medicine

## 2013-05-27 ENCOUNTER — Non-Acute Institutional Stay (SKILLED_NURSING_FACILITY): Payer: Medicare Other | Admitting: Internal Medicine

## 2013-05-27 DIAGNOSIS — D649 Anemia, unspecified: Secondary | ICD-10-CM

## 2013-05-27 DIAGNOSIS — E2740 Unspecified adrenocortical insufficiency: Secondary | ICD-10-CM

## 2013-05-27 DIAGNOSIS — G20A1 Parkinson's disease without dyskinesia, without mention of fluctuations: Secondary | ICD-10-CM

## 2013-05-27 DIAGNOSIS — E2749 Other adrenocortical insufficiency: Secondary | ICD-10-CM | POA: Diagnosis not present

## 2013-05-27 DIAGNOSIS — G8250 Quadriplegia, unspecified: Secondary | ICD-10-CM | POA: Diagnosis not present

## 2013-05-27 DIAGNOSIS — R569 Unspecified convulsions: Secondary | ICD-10-CM | POA: Diagnosis not present

## 2013-05-27 DIAGNOSIS — G20 Parkinson's disease: Secondary | ICD-10-CM

## 2013-05-27 NOTE — Progress Notes (Signed)
Patient ID: [name], male   DOB: [birth year], 58 y.o.   MRN: [id number] This is a routine visit.  Level of care skilled.  Facility as far.  Chief complaint-medical management of chronic medical conditions including quadriplegia-Parkinson's-seizure disorder-adrenal insufficiency--anemia-.  History of present illness.  Patient is a 58 year old male with the above diagnoses according to nursing staff he has been relatively stable recently-does appear he does have a history of UTIs and recently completed a course of Macrobid for an Escherichia coli infection.  Nursing staff has also left a note about family noticing occasionally a blood clot in his nose-apparently there has been no active bleeding noted by nursing staff.  I do not see that he is on any anticoagulant.  His vital signs remained stable.  He does have a history of seizure disorder he is on Dilantin apparently there've been no recent seizures.  Appears his hemoglobin hovers around the 10 range and this has been stable for some time as well he is on iron for suspected iron deficiency anemia.  Also has a history of adrenal insufficiency he is on chronic prednisone as well as Florinef.  He does have a listed history of Parkinson's disease continues on Sinemet.   family medical social history reviewed including admission note on 06/21/2012.  Medications have been reviewed per [facility]   Review of systems-as noted in history of present illness essentially unobtainable from patient according to nursing staff he has been stable with no coughing shortness of breath complaints of chest pain or acute muscle skeletal pain noted.  Physical exam.  Temperature is 97.1 pulse 78 respirations 16 blood pressure 125/70  In general this is a somewhat frail-appearing middle-aged male in no distress lying comfortably in bed.  His skin is warm and dry.  Eyes pupils appear reactive to light sclera and conjunctiva are clear.  Oropharynx  clear mucous membranes moist.  Nose-I did not note any active bleeding or obstruction in either nare-he does  appear  To have a small amount of mucoid matter.  Chest is clear to auscultation shallow air entry but no labored breathing.  Heart is regular rate and rhythm without murmur gallop or rub he has no lower extremity edema.  Abdomen PEG site appears unremarkable bowel sounds are positive has some   distention but this does not appear to be changed from baseline abdomen is soft and nontender.  Muscle skeletal  he does have diffuse quadriplegia at baseline--he has a flexion contracture of his left wrist and contracture of his right hand as well  Neurologic as stated above he did not really speak but did interact and follow verbal commands  .  .  Labs.   05/06/2013.  Sodium 135 potassium 3.7 BUN 14 creatinine 0.5 liver function tests within normal limits albumin of note 3.5.  WBC 6.4 hemoglobin 10.4 platelets 250.  04/16/2013.  Dilantin level11I.8.  Assessment and plan.  #1-history of quadriplegia--patient remains at baseline apparently is tolerating his. Tube feedings relatively well  continue to monitor.  #2-history seizure disorder continues on Dilantin apparently this has been relatively symptom free for a while-Will update Dilantin level.  #3-Parkinson's-this appears to be at baseline he is on Sinemet.  #4-anemia most likely iron deficiency he is on iron hemoglobin has shown stability we'll update this--apparently there have been some episodes of dried blood in his nares-at this point will monitor Will see what the blood work tells [place] this may be caused by dry air at times.  #5-history of  adrenal insufficiency he is on prednisone and Florinef will update a metabolic panel.-We'll check hemoglobin A1c  History of apparent allergic rhinitis he continues on Zyrtec apparently this has been stable as well.  [id number] .  i

## 2013-05-31 DIAGNOSIS — I10 Essential (primary) hypertension: Secondary | ICD-10-CM | POA: Diagnosis not present

## 2013-05-31 DIAGNOSIS — D649 Anemia, unspecified: Secondary | ICD-10-CM | POA: Diagnosis not present

## 2013-05-31 DIAGNOSIS — E119 Type 2 diabetes mellitus without complications: Secondary | ICD-10-CM | POA: Diagnosis not present

## 2013-05-31 DIAGNOSIS — Z5181 Encounter for therapeutic drug level monitoring: Secondary | ICD-10-CM | POA: Diagnosis not present

## 2013-06-06 DIAGNOSIS — B351 Tinea unguium: Secondary | ICD-10-CM | POA: Diagnosis not present

## 2013-06-06 DIAGNOSIS — G8220 Paraplegia, unspecified: Secondary | ICD-10-CM | POA: Diagnosis not present

## 2013-06-06 DIAGNOSIS — M79609 Pain in unspecified limb: Secondary | ICD-10-CM | POA: Diagnosis not present

## 2013-06-11 DIAGNOSIS — H1010 Acute atopic conjunctivitis, unspecified eye: Secondary | ICD-10-CM | POA: Diagnosis not present

## 2013-06-11 DIAGNOSIS — IMO0002 Reserved for concepts with insufficient information to code with codable children: Secondary | ICD-10-CM | POA: Diagnosis not present

## 2013-06-11 DIAGNOSIS — H4320 Crystalline deposits in vitreous body, unspecified eye: Secondary | ICD-10-CM | POA: Diagnosis not present

## 2013-06-11 DIAGNOSIS — H2510 Age-related nuclear cataract, unspecified eye: Secondary | ICD-10-CM | POA: Diagnosis not present

## 2013-06-19 ENCOUNTER — Encounter: Payer: Self-pay | Admitting: Internal Medicine

## 2013-06-19 ENCOUNTER — Encounter (HOSPITAL_COMMUNITY): Payer: Self-pay

## 2013-06-19 ENCOUNTER — Non-Acute Institutional Stay (SKILLED_NURSING_FACILITY): Payer: Medicare Other | Admitting: Internal Medicine

## 2013-06-19 ENCOUNTER — Encounter (HOSPITAL_COMMUNITY): Payer: Self-pay | Admitting: Emergency Medicine

## 2013-06-19 ENCOUNTER — Emergency Department (HOSPITAL_COMMUNITY): Payer: Medicare Other

## 2013-06-19 ENCOUNTER — Inpatient Hospital Stay (HOSPITAL_COMMUNITY)
Admission: EM | Admit: 2013-06-19 | Discharge: 2013-06-24 | DRG: 871 | Disposition: A | Discharge status: Skilled Nursing Facility | Payer: Medicare Other | Attending: Internal Medicine | Admitting: Internal Medicine

## 2013-06-19 DIAGNOSIS — G20 Parkinson's disease: Secondary | ICD-10-CM | POA: Diagnosis present

## 2013-06-19 DIAGNOSIS — Z79899 Other long term (current) drug therapy: Secondary | ICD-10-CM

## 2013-06-19 DIAGNOSIS — R6510 Systemic inflammatory response syndrome (SIRS) of non-infectious origin without acute organ dysfunction: Secondary | ICD-10-CM | POA: Diagnosis not present

## 2013-06-19 DIAGNOSIS — R1310 Dysphagia, unspecified: Secondary | ICD-10-CM | POA: Diagnosis present

## 2013-06-19 DIAGNOSIS — F32A Depression, unspecified: Secondary | ICD-10-CM

## 2013-06-19 DIAGNOSIS — R143 Flatulence: Secondary | ICD-10-CM | POA: Diagnosis present

## 2013-06-19 DIAGNOSIS — A498 Other bacterial infections of unspecified site: Secondary | ICD-10-CM | POA: Diagnosis not present

## 2013-06-19 DIAGNOSIS — J811 Chronic pulmonary edema: Secondary | ICD-10-CM | POA: Diagnosis not present

## 2013-06-19 DIAGNOSIS — R509 Fever, unspecified: Secondary | ICD-10-CM

## 2013-06-19 DIAGNOSIS — R142 Eructation: Secondary | ICD-10-CM | POA: Diagnosis present

## 2013-06-19 DIAGNOSIS — R569 Unspecified convulsions: Secondary | ICD-10-CM

## 2013-06-19 DIAGNOSIS — E876 Hypokalemia: Secondary | ICD-10-CM

## 2013-06-19 DIAGNOSIS — N10 Acute tubulo-interstitial nephritis: Secondary | ICD-10-CM

## 2013-06-19 DIAGNOSIS — K567 Ileus, unspecified: Secondary | ICD-10-CM

## 2013-06-19 DIAGNOSIS — Z7401 Bed confinement status: Secondary | ICD-10-CM

## 2013-06-19 DIAGNOSIS — J9819 Other pulmonary collapse: Secondary | ICD-10-CM | POA: Diagnosis not present

## 2013-06-19 DIAGNOSIS — R197 Diarrhea, unspecified: Secondary | ICD-10-CM

## 2013-06-19 DIAGNOSIS — R059 Cough, unspecified: Secondary | ICD-10-CM | POA: Diagnosis not present

## 2013-06-19 DIAGNOSIS — J40 Bronchitis, not specified as acute or chronic: Secondary | ICD-10-CM

## 2013-06-19 DIAGNOSIS — T50995A Adverse effect of other drugs, medicaments and biological substances, initial encounter: Secondary | ICD-10-CM | POA: Diagnosis present

## 2013-06-19 DIAGNOSIS — A419 Sepsis, unspecified organism: Secondary | ICD-10-CM | POA: Diagnosis not present

## 2013-06-19 DIAGNOSIS — G8250 Quadriplegia, unspecified: Secondary | ICD-10-CM | POA: Diagnosis present

## 2013-06-19 DIAGNOSIS — N12 Tubulo-interstitial nephritis, not specified as acute or chronic: Secondary | ICD-10-CM | POA: Diagnosis not present

## 2013-06-19 DIAGNOSIS — R6520 Severe sepsis without septic shock: Secondary | ICD-10-CM

## 2013-06-19 DIAGNOSIS — G8220 Paraplegia, unspecified: Secondary | ICD-10-CM | POA: Diagnosis not present

## 2013-06-19 DIAGNOSIS — D72829 Elevated white blood cell count, unspecified: Secondary | ICD-10-CM

## 2013-06-19 DIAGNOSIS — R141 Gas pain: Secondary | ICD-10-CM | POA: Diagnosis present

## 2013-06-19 DIAGNOSIS — E2740 Unspecified adrenocortical insufficiency: Secondary | ICD-10-CM

## 2013-06-19 DIAGNOSIS — K5900 Constipation, unspecified: Secondary | ICD-10-CM | POA: Diagnosis present

## 2013-06-19 DIAGNOSIS — I10 Essential (primary) hypertension: Secondary | ICD-10-CM | POA: Diagnosis not present

## 2013-06-19 DIAGNOSIS — IMO0002 Reserved for concepts with insufficient information to code with codable children: Secondary | ICD-10-CM

## 2013-06-19 DIAGNOSIS — R4182 Altered mental status, unspecified: Secondary | ICD-10-CM | POA: Diagnosis present

## 2013-06-19 DIAGNOSIS — F3289 Other specified depressive episodes: Secondary | ICD-10-CM | POA: Diagnosis present

## 2013-06-19 DIAGNOSIS — Z931 Gastrostomy status: Secondary | ICD-10-CM

## 2013-06-19 DIAGNOSIS — G40909 Epilepsy, unspecified, not intractable, without status epilepticus: Secondary | ICD-10-CM | POA: Diagnosis present

## 2013-06-19 DIAGNOSIS — E1143 Type 2 diabetes mellitus with diabetic autonomic (poly)neuropathy: Secondary | ICD-10-CM | POA: Diagnosis present

## 2013-06-19 DIAGNOSIS — B9620 Unspecified Escherichia coli [E. coli] as the cause of diseases classified elsewhere: Secondary | ICD-10-CM

## 2013-06-19 DIAGNOSIS — R112 Nausea with vomiting, unspecified: Secondary | ICD-10-CM | POA: Diagnosis not present

## 2013-06-19 DIAGNOSIS — D649 Anemia, unspecified: Secondary | ICD-10-CM

## 2013-06-19 DIAGNOSIS — D509 Iron deficiency anemia, unspecified: Secondary | ICD-10-CM

## 2013-06-19 DIAGNOSIS — R4701 Aphasia: Secondary | ICD-10-CM | POA: Diagnosis present

## 2013-06-19 DIAGNOSIS — G20A1 Parkinson's disease without dyskinesia, without mention of fluctuations: Secondary | ICD-10-CM

## 2013-06-19 DIAGNOSIS — N390 Urinary tract infection, site not specified: Secondary | ICD-10-CM | POA: Diagnosis not present

## 2013-06-19 DIAGNOSIS — K3184 Gastroparesis: Secondary | ICD-10-CM | POA: Diagnosis present

## 2013-06-19 DIAGNOSIS — R109 Unspecified abdominal pain: Secondary | ICD-10-CM | POA: Diagnosis not present

## 2013-06-19 DIAGNOSIS — Z1635 Resistance to multiple antimicrobial drugs: Secondary | ICD-10-CM

## 2013-06-19 DIAGNOSIS — E2749 Other adrenocortical insufficiency: Secondary | ICD-10-CM

## 2013-06-19 DIAGNOSIS — A4159 Other Gram-negative sepsis: Secondary | ICD-10-CM | POA: Diagnosis not present

## 2013-06-19 DIAGNOSIS — K7689 Other specified diseases of liver: Secondary | ICD-10-CM | POA: Diagnosis not present

## 2013-06-19 DIAGNOSIS — F329 Major depressive disorder, single episode, unspecified: Secondary | ICD-10-CM | POA: Diagnosis present

## 2013-06-19 DIAGNOSIS — R05 Cough: Secondary | ICD-10-CM | POA: Diagnosis not present

## 2013-06-19 DIAGNOSIS — E871 Hypo-osmolality and hyponatremia: Secondary | ICD-10-CM

## 2013-06-19 DIAGNOSIS — R0602 Shortness of breath: Secondary | ICD-10-CM | POA: Diagnosis not present

## 2013-06-19 HISTORY — DX: Dysphagia, unspecified: R13.10

## 2013-06-19 HISTORY — DX: Aphasia: R47.01

## 2013-06-19 LAB — CBC WITH DIFFERENTIAL/PLATELET
BASOS ABS: 0 10*3/uL (ref 0.0–0.1)
Basophils Relative: 0 % (ref 0–1)
EOS ABS: 0.1 10*3/uL (ref 0.0–0.7)
Eosinophils Relative: 1 % (ref 0–5)
HEMATOCRIT: 38.9 % — AB (ref 39.0–52.0)
Hemoglobin: 12.8 g/dL — ABNORMAL LOW (ref 13.0–17.0)
LYMPHS PCT: 11 % — AB (ref 12–46)
Lymphs Abs: 1.2 10*3/uL (ref 0.7–4.0)
MCH: 24.4 pg — ABNORMAL LOW (ref 26.0–34.0)
MCHC: 32.9 g/dL (ref 30.0–36.0)
MCV: 74.1 fL — ABNORMAL LOW (ref 78.0–100.0)
MONOS PCT: 19 % — AB (ref 3–12)
Monocytes Absolute: 2.1 10*3/uL — ABNORMAL HIGH (ref 0.1–1.0)
NEUTROS ABS: 7.6 10*3/uL (ref 1.7–7.7)
NEUTROS PCT: 69 % (ref 43–77)
Platelets: 243 10*3/uL (ref 150–400)
RBC: 5.25 MIL/uL (ref 4.22–5.81)
RDW: 13.6 % (ref 11.5–15.5)
WBC: 11 10*3/uL — AB (ref 4.0–10.5)

## 2013-06-19 LAB — COMPREHENSIVE METABOLIC PANEL
ALBUMIN: 3.8 g/dL (ref 3.5–5.2)
ALK PHOS: 174 U/L — AB (ref 39–117)
ALT: 34 U/L (ref 0–53)
AST: 70 U/L — AB (ref 0–37)
BILIRUBIN TOTAL: 0.2 mg/dL — AB (ref 0.3–1.2)
BUN: 22 mg/dL (ref 6–23)
CHLORIDE: 97 meq/L (ref 96–112)
CO2: 25 mEq/L (ref 19–32)
Calcium: 9.6 mg/dL (ref 8.4–10.5)
Creatinine, Ser: 0.66 mg/dL (ref 0.50–1.35)
GFR calc Af Amer: >90 mL/min (ref >90)
GFR calc non Af Amer: >90 mL/min (ref >90)
Glucose, Bld: 92 mg/dL (ref 70–99)
POTASSIUM: 3.7 meq/L (ref 3.7–5.3)
SODIUM: 137 meq/L (ref 137–147)
TOTAL PROTEIN: 9.5 g/dL — AB (ref 6.0–8.3)

## 2013-06-19 LAB — URINALYSIS, ROUTINE W REFLEX MICROSCOPIC
BILIRUBIN URINE: NEGATIVE
Glucose, UA: NEGATIVE mg/dL
Ketones, ur: NEGATIVE mg/dL
Nitrite: NEGATIVE
PROTEIN: 30 mg/dL — AB
Specific Gravity, Urine: 1.021 (ref 1.005–1.030)
Urobilinogen, UA: 0.2 mg/dL (ref 0.0–1.0)
pH: 7.5 (ref 5.0–8.0)

## 2013-06-19 LAB — PHENYTOIN LEVEL, TOTAL: PHENYTOIN LVL: 18.9 ug/mL (ref 10.0–20.0)

## 2013-06-19 LAB — URINE MICROSCOPIC-ADD ON

## 2013-06-19 MED ORDER — VANCOMYCIN HCL IN DEXTROSE 1-5 GM/200ML-% IV SOLN
1000.0000 mg | Freq: Three times a day (TID) | INTRAVENOUS | Status: DC
  Administered 2013-06-20 – 2013-06-21 (×4): 1000 mg via INTRAVENOUS
  Filled 2013-06-19 (×6): qty 200

## 2013-06-19 MED ORDER — LORATADINE 10 MG PO TABS
10.0000 mg | ORAL_TABLET | Freq: Every day | ORAL | Status: DC
  Administered 2013-06-20 – 2013-06-24 (×4): 10 mg
  Filled 2013-06-19 (×6): qty 1

## 2013-06-19 MED ORDER — LEVOFLOXACIN IN D5W 500 MG/100ML IV SOLN: 500.0000 mg | INTRAVENOUS | Status: DC

## 2013-06-19 MED ORDER — POLYVINYL ALCOHOL 1.4 % OP SOLN
1.0000 [drp] | Freq: Every day | OPHTHALMIC | Status: DC
  Administered 2013-06-20 – 2013-06-24 (×5): 1 [drp] via OPHTHALMIC
  Filled 2013-06-19 (×2): qty 15

## 2013-06-19 MED ORDER — ONDANSETRON HCL 4 MG/2ML IJ SOLN
4.0000 mg | Freq: Four times a day (QID) | INTRAMUSCULAR | Status: DC | PRN
  Administered 2013-06-20 – 2013-06-21 (×2): 4 mg via INTRAVENOUS
  Filled 2013-06-19 (×2): qty 2

## 2013-06-19 MED ORDER — SACCHAROMYCES BOULARDII 250 MG PO CAPS
250.0000 mg | ORAL_CAPSULE | Freq: Two times a day (BID) | ORAL | Status: DC
  Administered 2013-06-19 – 2013-06-24 (×9): 250 mg via ORAL
  Filled 2013-06-19 (×11): qty 1

## 2013-06-19 MED ORDER — ACETAMINOPHEN 650 MG RE SUPP: 650.0000 mg | RECTAL | Status: DC | PRN

## 2013-06-19 MED ORDER — LEVOFLOXACIN IN D5W 750 MG/150ML IV SOLN
750.0000 mg | Freq: Once | INTRAVENOUS | Status: AC
  Administered 2013-06-19: 750 mg via INTRAVENOUS
  Filled 2013-06-19: qty 150

## 2013-06-19 MED ORDER — IPRATROPIUM-ALBUTEROL 0.5-2.5 (3) MG/3ML IN SOLN
3.0000 mL | RESPIRATORY_TRACT | Status: DC
  Administered 2013-06-19: 3 mL via RESPIRATORY_TRACT
  Filled 2013-06-19: qty 3

## 2013-06-19 MED ORDER — TWOCAL HN PO LIQD: 1.0000 | ORAL | Status: DC

## 2013-06-19 MED ORDER — POLYETHYLENE GLYCOL 3350 17 G PO PACK
17.0000 g | PACK | Freq: Two times a day (BID) | ORAL | Status: DC | PRN
  Filled 2013-06-19: qty 1

## 2013-06-19 MED ORDER — LEVOFLOXACIN IN D5W 750 MG/150ML IV SOLN
750.0000 mg | INTRAVENOUS | Status: DC
  Administered 2013-06-20 – 2013-06-21 (×2): 750 mg via INTRAVENOUS
  Filled 2013-06-19 (×3): qty 150

## 2013-06-19 MED ORDER — SODIUM CHLORIDE 0.9 % IV SOLN
INTRAVENOUS | Status: DC
  Administered 2013-06-19: 16:00:00 via INTRAVENOUS

## 2013-06-19 MED ORDER — FERROUS SULFATE 300 (60 FE) MG/5ML PO SYRP
300.0000 mg | ORAL_SOLUTION | Freq: Every day | ORAL | Status: DC
  Administered 2013-06-20 – 2013-06-24 (×5): 300 mg
  Filled 2013-06-19 (×6): qty 5

## 2013-06-19 MED ORDER — OSMOLITE 1.5 CAL PO LIQD
1000.0000 mL | ORAL | Status: DC
  Administered 2013-06-20 – 2013-06-21 (×2): 1000 mL
  Filled 2013-06-19 (×5): qty 1000

## 2013-06-19 MED ORDER — VANCOMYCIN HCL 10 G IV SOLR
1500.0000 mg | Freq: Once | INTRAVENOUS | Status: AC
  Administered 2013-06-19: 1500 mg via INTRAVENOUS
  Filled 2013-06-19: qty 1500

## 2013-06-19 MED ORDER — AZTREONAM 2 G IJ SOLR
2.0000 g | Freq: Three times a day (TID) | INTRAMUSCULAR | Status: DC
  Administered 2013-06-20 – 2013-06-22 (×7): 2 g via INTRAVENOUS
  Filled 2013-06-19 (×9): qty 2

## 2013-06-19 MED ORDER — SODIUM CHLORIDE 0.9 % IV SOLN: INTRAVENOUS | Status: DC

## 2013-06-19 MED ORDER — ONDANSETRON HCL 4 MG PO TABS: 4.0000 mg | ORAL_TABLET | Freq: Four times a day (QID) | ORAL | Status: DC | PRN

## 2013-06-19 MED ORDER — ENOXAPARIN SODIUM 40 MG/0.4ML ~~LOC~~ SOLN
40.0000 mg | SUBCUTANEOUS | Status: DC
  Administered 2013-06-19 – 2013-06-23 (×5): 40 mg via SUBCUTANEOUS
  Filled 2013-06-19 (×6): qty 0.4

## 2013-06-19 MED ORDER — FOLIC ACID 1 MG PO TABS
1.0000 mg | ORAL_TABLET | Freq: Every morning | ORAL | Status: DC
  Administered 2013-06-20 – 2013-06-24 (×4): 1 mg
  Filled 2013-06-19 (×5): qty 1

## 2013-06-19 MED ORDER — GUAIFENESIN ER 600 MG PO TB12
1200.0000 mg | ORAL_TABLET | Freq: Two times a day (BID) | ORAL | Status: DC
  Administered 2013-06-19 – 2013-06-20 (×2): 1200 mg via ORAL
  Filled 2013-06-19 (×3): qty 2

## 2013-06-19 MED ORDER — ALBUTEROL SULFATE (2.5 MG/3ML) 0.083% IN NEBU: 2.5000 mg | INHALATION_SOLUTION | RESPIRATORY_TRACT | Status: DC | PRN

## 2013-06-19 MED ORDER — FERROUS SULFATE 220 (44 FE) MG/5ML PO ELIX
330.0000 mg | ORAL_SOLUTION | Freq: Every morning | ORAL | Status: DC
Start: 2013-06-20 — End: 2013-06-19

## 2013-06-19 MED ORDER — GUAIFENESIN 100 MG/5ML PO SOLN: 10.0000 mL | ORAL | Status: DC | PRN

## 2013-06-19 MED ORDER — SODIUM CHLORIDE 0.9 % IV BOLUS (SEPSIS)
1000.0000 mL | Freq: Once | INTRAVENOUS | Status: AC
  Administered 2013-06-19: 1000 mL via INTRAVENOUS

## 2013-06-19 MED ORDER — PHENYTOIN 125 MG/5ML PO SUSP
100.0000 mg | Freq: Two times a day (BID) | ORAL | Status: DC
  Administered 2013-06-19 – 2013-06-21 (×6): 100 mg
  Filled 2013-06-19 (×7): qty 4

## 2013-06-19 MED ORDER — DEXTROSE 5 % IV SOLN: 1.0000 g | Freq: Once | INTRAVENOUS | Status: DC

## 2013-06-19 MED ORDER — BACLOFEN 10 MG PO TABS
10.0000 mg | ORAL_TABLET | Freq: Three times a day (TID) | ORAL | Status: DC
  Administered 2013-06-19 – 2013-06-24 (×12): 10 mg
  Filled 2013-06-19 (×16): qty 1

## 2013-06-19 MED ORDER — CARBIDOPA-LEVODOPA 25-100 MG PO TABS
1.0000 | ORAL_TABLET | Freq: Three times a day (TID) | ORAL | Status: DC
  Administered 2013-06-19 – 2013-06-24 (×12): 1
  Filled 2013-06-19 (×16): qty 1

## 2013-06-19 MED ORDER — DEXTROSE 5 % IV SOLN
2.0000 g | Freq: Once | INTRAVENOUS | Status: AC
  Administered 2013-06-19: 2 g via INTRAVENOUS
  Filled 2013-06-19: qty 2

## 2013-06-19 MED ORDER — OLOPATADINE HCL 0.1 % OP SOLN
1.0000 [drp] | Freq: Every morning | OPHTHALMIC | Status: DC
  Administered 2013-06-20 – 2013-06-24 (×5): 1 [drp] via OPHTHALMIC
  Filled 2013-06-19 (×2): qty 5

## 2013-06-19 MED ORDER — FLUDROCORTISONE ACETATE 0.1 MG PO TABS
0.1000 mg | ORAL_TABLET | Freq: Every evening | ORAL | Status: DC
  Administered 2013-06-19 – 2013-06-23 (×4): 0.1 mg
  Filled 2013-06-19 (×6): qty 1

## 2013-06-19 MED ORDER — OSELTAMIVIR PHOSPHATE 6 MG/ML PO SUSR
75.0000 mg | Freq: Two times a day (BID) | ORAL | Status: DC
  Administered 2013-06-20 (×2): 75 mg
  Filled 2013-06-19 (×4): qty 12.5

## 2013-06-19 MED ORDER — VANCOMYCIN HCL IN DEXTROSE 1-5 GM/200ML-% IV SOLN: 1000.0000 mg | Freq: Once | INTRAVENOUS | Status: DC

## 2013-06-19 MED ORDER — PANTOPRAZOLE SODIUM 40 MG PO PACK
40.0000 mg | PACK | Freq: Every day | ORAL | Status: DC
  Administered 2013-06-19 – 2013-06-24 (×5): 40 mg
  Filled 2013-06-19 (×6): qty 20

## 2013-06-19 MED ORDER — DOCUSATE SODIUM 50 MG/5ML PO LIQD
100.0000 mg | Freq: Two times a day (BID) | ORAL | Status: DC | PRN
  Filled 2013-06-19: qty 10

## 2013-06-19 MED ORDER — SODIUM CHLORIDE 0.9 % IJ SOLN
3.0000 mL | Freq: Two times a day (BID) | INTRAMUSCULAR | Status: DC
  Administered 2013-06-23: 10 mL via INTRAVENOUS

## 2013-06-19 MED ORDER — SODIUM CHLORIDE 0.9 % IV SOLN
INTRAVENOUS | Status: DC
  Administered 2013-06-19: via INTRAVENOUS
  Administered 2013-06-20: 100 mL/h via INTRAVENOUS

## 2013-06-19 MED ORDER — PREDNISONE 5 MG/5ML PO SOLN
20.0000 mg | Freq: Every day | ORAL | Status: DC
  Administered 2013-06-20 – 2013-06-22 (×3): 20 mg
  Filled 2013-06-19 (×5): qty 20

## 2013-06-19 NOTE — H&P (Signed)
[facility] History and Physical  [name] VFI:[id number] DOB: Aug 07, 1955 DOA: 06/19/2013  Referring physician: Dr. Freddrick March PCP: [name], MD   Chief Complaint: Fever  HPI: [name] is a 57 y.o. male  With history of quadriplegia, dysphagia status post peg tube placement, aphasia, depression, seizure disorder, adrenal insufficiency, Parkinson's disease( which mother states is the first time she is hearing of it), nursing home resident who presents to the ED with a one-day history of fever with a temp of 102.6 at the facility and change in his breathing with a productive cough per mother. Mother also endorses some nausea and an episode of emesis, some dysuria, weakness, and a clammy sensation. Her mother patient also noted to be congested. Mother denies any chest pain, no shortness of breath, no abdominal pain, no other associated symptoms. Chest x-ray was done at the facility which was negative. Repeat chest x-ray done in the ED was negative. Comprehensive metabolic profile done at out phosphatase of 174 AST of 70 protein of 9.5 total bilirubin of 0.2 otherwise was within normal limits. CBC done at a white count of 11 hemoglobin of 12.8 otherwise was within normal limits. Urinalysis done was nitrite negative moderate leukocytes large hemoglobin 21-50 WBCs 11-20 RBCs many bacteria. Urine cultures were obtained. Blood cultures also obtained. Patient was given a dose of IV vancomycin, aztreonam, and Levaquin. We were called to admit the patient for further evaluation and management.   Review of Systems: As per history of present illness otherwise negative. Constitutional:  No weight loss, night sweats, Fevers, chills, fatigue.  HEENT:  No headaches, Difficulty swallowing,Tooth/dental problems,Sore throat,  No sneezing, itching, ear ache, nasal congestion, post nasal drip,  Cardio-vascular:  No chest pain, Orthopnea, PND, swelling in lower extremities, anasarca,  dizziness, palpitations  GI:  No heartburn, indigestion, abdominal pain, nausea, vomiting, diarrhea, change in bowel habits, loss of appetite  Resp:  No shortness of breath with exertion or at rest. No excess mucus, no productive cough, No non-productive cough, No coughing up of blood.No change in color of mucus.No wheezing.No chest wall deformity  Skin:  no rash or lesions.  GU:  no dysuria, change in color of urine, no urgency or frequency. No flank pain.  Musculoskeletal:  No joint pain or swelling. No decreased range of motion. No back pain.  Psych:  No change in mood or affect. No depression or anxiety. No memory loss.   Past Medical History  Diagnosis Date  . Quadriplegia   . Depression   . Anemia   . Parkinson's disease   . Seizures   . Adrenal insufficiency   . Dysphagia 06/19/2013  . Aphasia 06/19/2013   Past Surgical History  Procedure Laterality Date  . Peg tube placement    . Peripherally inserted central catheter insertion     Social History:  reports that he has never smoked. He has never used smokeless tobacco. He reports that he does not drink alcohol or use illicit drugs.  Allergies  Allergen Reactions  . Aspirin     unk reaction  . Nsaids     unk reaction  . Penicillins     unk reaction    History reviewed. No pertinent family history.   Prior to Admission medications   Medication Sig Start Date End Date Taking? Authorizing Provider  acetaminophen (TYLENOL) 650 MG suppository Place 650 mg rectally every 4 (four) hours as needed for mild pain.   Yes Historical Provider, MD  baclofen (LIORESAL) 10 MG tablet  Give 10 mg by tube 3 (three) times daily.   Yes Historical Provider, MD  carbidopa-levodopa (SINEMET) 25-100 MG per tablet 1 tablet by PEG Tube route 3 (three) times daily.    Yes Historical Provider, MD  cetirizine (ZYRTEC) 5 MG tablet 5 mg by PEG Tube route every morning.    Yes Historical Provider, MD  docusate (COLACE) 50 MG/5ML liquid Place 100  mg into feeding tube every 12 (twelve) hours as needed for mild constipation.   Yes Historical Provider, MD  esomeprazole (NEXIUM) 20 MG capsule Give 20 mg by tube daily before breakfast. Give 1 capsule via tube (do not crush). Mix in 50mL H2O, shake well, give per tube immediately and flush tube afterward.   Yes Historical Provider, MD  ferrous sulfate 220 (44 FE) MG/5ML solution 330 mg by PEG Tube route every morning.    Yes Historical Provider, MD  fludrocortisone (FLORINEF) 0.1 MG tablet 0.1 mg by PEG Tube route every evening. Adrenocortical Insufficiency   Yes Historical Provider, MD  folic acid (FOLVITE) 1 MG tablet 1 mg by PEG Tube route every morning.    Yes Historical Provider, MD  guaiFENesin (ROBITUSSIN) 100 MG/5ML SOLN Place 10 mLs into feeding tube every 4 (four) hours as needed for cough or to loosen phlegm.   Yes Historical Provider, MD  ipratropium-albuterol (DUONEB) 0.5-2.5 (3) MG/3ML SOLN Take 3 mLs by nebulization every 4 (four) hours.   Yes Historical Provider, MD  moxifloxacin (AVELOX) 400 MG tablet Take 400 mg by mouth daily at 8 pm. For 10 days 06/19/13  Yes Historical Provider, MD  Nutritional Supplements (TWOCAL HN) LIQD Give 1 Can by tube continuous. Run@ 55cc/hr for 20 hrs. Turn off pump 1 hour before and keep off until 1 hour after phenytoin dose   Yes Historical Provider, MD  olopatadine (PATANOL) 0.1 % ophthalmic solution Place 1 drop into both eyes every morning.    Yes Historical Provider, MD  phenytoin (DILANTIN) 125 MG/5ML suspension Place 100 mg into feeding tube 2 (two) times daily.    Yes Historical Provider, MD  polyethylene glycol (MIRALAX / GLYCOLAX) packet Take 17 g by mouth 2 (two) times daily as needed for mild constipation.   Yes Historical Provider, MD  polyvinyl alcohol (LIQUIFILM TEARS) 1.4 % ophthalmic solution Place 1 drop into both eyes daily.    Yes Historical Provider, MD  predniSONE (DELTASONE) 10 MG tablet Give 10 mg by tube every morning.   Yes  Historical Provider, MD  saccharomyces boulardii (FLORASTOR) 250 MG capsule Take 250 mg by mouth 2 (two) times daily. For 10 days 06/18/13  Yes Historical Provider, MD   Physical Exam: Filed Vitals:   06/19/13 2000  BP: 102/63  Pulse:   Temp:   Resp: 26    BP 102/63  Pulse 94  Temp(Src) 103.8 F (39.9 C) (Rectal)  Resp 26  Ht 6\' 2"  (1.88 m)  Wt 70.308 kg (155 lb)  BMI 19.89 kg/m2  SpO2 97%  General:  Appears calm and comfortable. Patient is aphasic and mumbles from time to time which mother seems to understand. Eyes: PERRLA, EOMI, normal lids, irises & conjunctiva ENT: grossly normal hearing, lips & tongue Neck: no LAD, masses or thyromegaly Cardiovascular: RRR, no m/r/g. No LE edema. Respiratory: CTA bilaterally, no w/r/r. Normal respiratory effort. No wheezing noted. Abdomen: soft, ntnd, positive bowel sounds. No rebound no guarding. Skin: no rash or induration seen on limited exam Musculoskeletal: grossly normal tone BUE/BLE Psychiatric: Flat affect. grossly normal mood  and affect, speech fluent and appropriate Neurologic: Alert. Is aphasic. Quadriplegic. Unable to assess rest of neurological exam.           Labs on Admission:  Basic Metabolic Panel:  Recent Labs Lab 06/19/13 1555  NA 137  K 3.7  CL 97  CO2 25  GLUCOSE 92  BUN 22  CREATININE 0.66  CALCIUM 9.6   Liver Function Tests:  Recent Labs Lab 06/19/13 1555  AST 70*  ALT 34  ALKPHOS 174*  BILITOT 0.2*  PROT 9.5*  ALBUMIN 3.8   No results found for this basename: LIPASE, AMYLASE,  in the last 168 hours No results found for this basename: AMMONIA,  in the last 168 hours CBC:  Recent Labs Lab 06/19/13 1555  WBC 11.0*  NEUTROABS 7.6  HGB 12.8*  HCT 38.9*  MCV 74.1*  PLT 243   Cardiac Enzymes: No results found for this basename: CKTOTAL, CKMB, CKMBINDEX, TROPONINI,  in the last 168 hours  BNP (last 3 results) No results found for this basename: PROBNP,  in the last 8760  hours CBG: No results found for this basename: GLUCAP,  in the last 168 hours  Radiological Exams on Admission: Dg Chest Portable 1 View  06/19/2013   CLINICAL DATA:  Fever. History of quadriplegia and Parkinson's disease.  EXAM: PORTABLE CHEST - 1 VIEW  COMPARISON:  DG ABD ACUTE W/CHEST dated 04/14/2013; DG CHEST 1V PORT dated 06/14/2012  FINDINGS: The heart size and mediastinal contours are stable. There is stable chronic elevation of the right hemidiaphragm with mild chronic right basilar atelectasis. Overall pulmonary aeration has improved. There is no edema or confluent airspace opacity.  IMPRESSION: Stable chronic right basilar atelectasis. No acute cardiopulmonary process.   Electronically Signed   By: Roxy Horseman M.D.   On: 06/19/2013 15:38    EKG: Not done.  Assessment/Plan Principal Problem:   SIRS (systemic inflammatory response syndrome) Active Problems:   Adrenal insufficiency   Quadriplegia   Parkinson's disease   Seizures   Anemia   Depression   Fever   UTI (lower urinary tract infection)   Iron deficiency anemia, unspecified   Glucocorticoid deficiency   Leukocytosis   Bronchitis   Dysphagia   Aphasia  #1 systemic inflammatory response syndrome Patient noted to have a temperature 103.8 in the emergency room with a leukocytosis and heart rates in the 108. Urinalysis which was done was worrisome for urinary tract infection. Chest x-ray was negative. Blood cultures have been drawn and are currently pending. Patient noted to have presented with a cough some mild shortness of breath and a fever. Check a sputum Gram stain and culture. Check a urine Legionella antigen. Check a urine pneumococcus antigen. Chest x-ray was negative and concern for possible bronchitis. Will repeat chest x-ray in the morning if the patient has been hydrated. We'll place empirically on IV vancomycin IV Levaquin. Follow.  #2 history of adrenal insufficiency Continue home regimen of Florinef. WIll  double home dose prednisone in light of patient's fever. Follow.  #3 seizure disorder Phenytoin level was 18.9. Corrected phenytoin level is 21.97. Continue home dose of phenytoin.  #4 fever May be secondary to urinary tract infection. Urine cultures are pending. Chest x-ray is negative. Blood cultures are pending. Repeat chest x-ray in the morning patient has been hydrated. Placed empirically on IV vancomycin IV Levaquin. Follow.  #5 leukocytosis Likely secondary to probable UTI. Patient also chronic prednisone therapy. Patient has been pancultured. Repeat chest x-ray in the morning.  Placed empirically on IV vancomycin and IV Levaquin.  #6 urinary tract infection Urine cultures are pending. IV Levaquin.  #7 probable acute bronchitis Check a sputum Gram stain and culture. IV Levaquin.  #8 quadriplegia/aphasia/dysphagia Status post PEG tube placement. Continue tube feeds. Continue baclofen.  #9 history of iron deficiency anemia H&H stable. Continue iron supplementation. Continue folic acid.  #10 history of Parkinson's disease Continue Sinemet.  #11 prophylaxis PPI for GI prophylaxis. Lovenox for DVT prophylaxis.  Code Status: Full Family Communication: Updated mother at bedside. Disposition Plan: Admit to MedSurg  Time spent: 70 minutes  Karle Desrosier M.D. [facility] Pager [phone]

## 2013-06-19 NOTE — Progress Notes (Signed)
MEDICATION RELATED CONSULT NOTE - INITIAL   Pharmacy Consult for Phenytoin Indication: Hx seizure disorder  Allergies  Allergen Reactions  . Aspirin     unk reaction  . Nsaids     unk reaction  . Penicillins     unk reaction    Patient Measurements: Height: 6\' 2"  (188 cm) Weight: 155 lb (70.308 kg) IBW/kg (Calculated) : 82.2  Vital Signs: Temp: 103.8 F (39.9 C) (03/25 1749) Temp src: Rectal (03/25 1749) BP: 102/63 mmHg (03/25 2000) Pulse Rate: 94 (03/25 1953)  Labs:  Recent Labs  06/19/13 1555  WBC 11.0*  HGB 12.8*  HCT 38.9*  PLT 243  CREATININE 0.66  ALBUMIN 3.8  PROT 9.5*  AST 70*  ALT 34  ALKPHOS 174*  BILITOT 0.2*   Estimated Creatinine Clearance: 102.5 ml/min (by C-G formula based on Cr of 0.66).   Microbiology: No results found for this or any previous visit (from the past 720 hour(s)).  Medical History: Past Medical History  Diagnosis Date  . Quadriplegia   . Depression   . Anemia   . Parkinson's disease   . Seizures   . Adrenal insufficiency   . Dysphagia 06/19/2013  . Aphasia 06/19/2013    Medications:  Scheduled:  . [START ON 06/20/2013] aztreonam  2 g Intravenous Q8H  . baclofen  10 mg Per Tube TID  . carbidopa-levodopa  1 tablet Per Tube TID  . enoxaparin (LOVENOX) injection  40 mg Subcutaneous Q24H  . [START ON 06/20/2013] ferrous sulfate  300 mg Per Tube Q breakfast  . fludrocortisone  0.1 mg Per Tube QPM  . [START ON 9/38/1017] folic acid  1 mg Per Tube q morning - 10a  . guaiFENesin  1,200 mg Oral BID  . ipratropium-albuterol  3 mL Nebulization Q4H  . [START ON 06/20/2013] levofloxacin (LEVAQUIN) IV  750 mg Intravenous Q24H  . [START ON 06/20/2013] loratadine  10 mg Per Tube Daily  . [START ON 06/20/2013] olopatadine  1 drop Both Eyes q morning - 10a  . oseltamivir  75 mg Per Tube BID  . pantoprazole sodium  40 mg Per Tube Daily  . phenytoin  100 mg Per Tube BID  . [START ON 06/20/2013] polyvinyl alcohol  1 drop Both Eyes Daily   . [START ON 06/20/2013] predniSONE  20 mg Per Tube Q breakfast  . saccharomyces boulardii  250 mg Oral BID  . sodium chloride  3 mL Intravenous Q12H  . vancomycin  1,500 mg Intravenous Once  . [START ON 06/20/2013] vancomycin  1,000 mg Intravenous Q8H   Infusions:  . sodium chloride 100 mL/hr at 06/19/13 1608  . sodium chloride    . sodium chloride      Assessment: 66 yoM admitted on 3/25 with r/o sepsis from [facility].  Prior to admission, he was receiving Phenytoin 100mg  per tube BID (last dose on 3/25 AM) for history of seizures.  Due to malabsorption with concurrent tube feeds, the patient's continuous tube feed was held 1 hour before and 1 hour after phenytoin dosing at nursing home.  Pharmacy is consulted to continue phenytoin dosing inpatient.   Albumin 3.8  SCr 0.66, CrCl > 100 ml/min  Phenytoin level = 18.9  Corrected phenytoin level = 21.9 (Adjusted concentration = measured total concentration divided by [(0.2 x albumin) + 0.1])   Goal of Therapy:  Total phenytoin level: 10-20 mcg/mL   Plan:   Continue phenytoin 100mg  per tube BID  Continue to hold tube  feeds 1 hour before and 1 hour after phenytoin dosing.    Follow up renal function, albumin, tube feedings, further levels as needed.   Gretta Arab PharmD, BCPS Pager [phone] 06/19/2013 10:01 PM

## 2013-06-19 NOTE — ED Provider Notes (Signed)
CSN: [id number]     Arrival date & time 06/19/13  1354 History   First MD Initiated Contact with Patient 06/19/13 1459     Chief Complaint  Patient presents with  . Fever   Level V caveat for altered mental status  (Consider location/radiation/quality/duration/timing/severity/associated sxs/prior Treatment) HPI Mother reports she was called by the patient's nursing home this morning because patient was having fever, spitting, and vomiting. She was told he had a warm normal bowel movement. She states she's noticing he is coughing today while in the ED. Patient denies any pain. He denies feeling short of breath. Patient has been a quadriplegia for over 10 years. His mother states it started with vomiting and diarrhea with a low sodium and potassium and he had a seizure and then was in a coma.  PCP Dr Dellia Nims  Past Medical History  Diagnosis Date  . Quadriplegia   . Depression   . Anemia   . Parkinson's disease   . Seizures   . Adrenal insufficiency    Past Surgical History  Procedure Laterality Date  . Peg tube placement    . Peripherally inserted central catheter insertion     No family history on file. History  Substance Use Topics  . Smoking status: Never Smoker   . Smokeless tobacco: Never Used  . Alcohol Use: No  lives in NH  Review of Systems  All other systems reviewed and are negative.      Allergies  Aspirin; Nsaids; and Penicillins  Home Medications   Current Outpatient Rx  Name  Route  Sig  Dispense  Refill  . acetaminophen (TYLENOL) 650 MG suppository   Rectal   Place 650 mg rectally every 4 (four) hours as needed for mild pain.         . baclofen (LIORESAL) 10 MG tablet   Tube   Give 10 mg by tube 3 (three) times daily.         . carbidopa-levodopa (SINEMET) 25-100 MG per tablet   PEG Tube   1 tablet by PEG Tube route 3 (three) times daily.          . cetirizine (ZYRTEC) 5 MG tablet   PEG Tube   5 mg by PEG Tube route every morning.           . docusate (COLACE) 50 MG/5ML liquid   Per Tube   Place 100 mg into feeding tube every 12 (twelve) hours as needed for mild constipation.         Marland Kitchen esomeprazole (NEXIUM) 20 MG capsule   Tube   Give 20 mg by tube daily before breakfast. Give 1 capsule via tube (do not crush). Mix in 19mL H2O, shake well, give per tube immediately and flush tube afterward.         . ferrous sulfate 220 (44 FE) MG/5ML solution   PEG Tube   330 mg by PEG Tube route every morning.          . fludrocortisone (FLORINEF) 0.1 MG tablet   PEG Tube   0.1 mg by PEG Tube route every evening. Adrenocortical Insufficiency         . folic acid (FOLVITE) 1 MG tablet   PEG Tube   1 mg by PEG Tube route every morning.          Marland Kitchen guaiFENesin (ROBITUSSIN) 100 MG/5ML SOLN   Per Tube   Place 10 mLs into feeding tube every 4 (four) hours as needed for cough  or to loosen phlegm.         Marland Kitchen ipratropium-albuterol (DUONEB) 0.5-2.5 (3) MG/3ML SOLN   Nebulization   Take 3 mLs by nebulization every 4 (four) hours.         . moxifloxacin (AVELOX) 400 MG tablet   Oral   Take 400 mg by mouth daily at 8 pm. For 10 days         . Nutritional Supplements (TWOCAL HN) LIQD   Tube   Give 1 Can by tube continuous. Run@ 55cc/hr for 20 hrs. Turn off pump 1 hour before and keep off until 1 hour after phenytoin dose         . olopatadine (PATANOL) 0.1 % ophthalmic solution   Both Eyes   Place 1 drop into both eyes every morning.          . phenytoin (DILANTIN) 125 MG/5ML suspension   Per Tube   Place 100 mg into feeding tube 2 (two) times daily.          . polyethylene glycol (MIRALAX / GLYCOLAX) packet   Oral   Take 17 g by mouth 2 (two) times daily as needed for mild constipation.         . polyvinyl alcohol (LIQUIFILM TEARS) 1.4 % ophthalmic solution   Both Eyes   Place 1 drop into both eyes daily.          . predniSONE (DELTASONE) 10 MG tablet   Tube   Give 10 mg by tube every  morning.         Marland Kitchen saccharomyces boulardii (FLORASTOR) 250 MG capsule   Oral   Take 250 mg by mouth 2 (two) times daily. For 10 days          BP 103/57  Pulse 106  Temp(Src) 102.4 F (39.1 C) (Rectal)  Resp 18  SpO2 96%  Vital signs normal except tachycardia and fever  Physical Exam  Nursing note and vitals reviewed. Constitutional: He appears well-developed and well-nourished.  Non-toxic appearance. He does not appear ill. No distress.  HENT:  Head: Normocephalic and atraumatic.  Right Ear: External ear normal.  Left Ear: External ear normal.  Nose: Nose normal. No mucosal edema or rhinorrhea.  Mouth/Throat: Mucous membranes are normal. No dental abscesses or uvula swelling.  Tongue mildly dry  Eyes: Conjunctivae and EOM are normal. Pupils are equal, round, and reactive to light.  Neck: Normal range of motion and full passive range of motion without pain. Neck supple.  Cardiovascular: Normal rate, regular rhythm and normal heart sounds.  Exam reveals no gallop and no friction rub.   No murmur heard. Pulmonary/Chest: Effort normal and breath sounds normal. No respiratory distress. He has no wheezes. He has no rhonchi. He has no rales. He exhibits no tenderness and no crepitus.  Coughing at times  Abdominal: Soft. Normal appearance and bowel sounds are normal. He exhibits no distension. There is no tenderness. There is no rebound and no guarding.  Musculoskeletal: Normal range of motion. He exhibits no edema and no tenderness.  Patient has quadriplegia with volar flexion at the wrists  Neurological: He is alert. He has normal strength. No cranial nerve deficit.  Pt responds verbally, but he only says yes or no and at times that is difficult to understand even by his mother  Skin: Skin is warm, dry and intact. No rash noted. No erythema. No pallor.  Psychiatric: He has a normal mood and affect. His speech is normal and behavior  is normal. His mood appears not anxious.    ED  Course  Procedures (including critical care time)  Medications  0.9 %  sodium chloride infusion ( Intravenous New Bag/Given 06/19/13 1608)  aztreonam (AZACTAM) 2 g in dextrose 5 % 50 mL IVPB (not administered)  sodium chloride 0.9 % bolus 1,000 mL (not administered)  vancomycin (VANCOCIN) 1,500 mg in sodium chloride 0.9 % 500 mL IVPB (not administered)  levofloxacin (LEVAQUIN) IVPB 750 mg (750 mg Intravenous New Bag/Given 06/19/13 1707)    Pt and mother given results of his tests. Are agreeable to be admitted to get culture results and get more specific definitive antibiotic coverage.   18:13 Dr Grandville Silos, admit to med-surg, team 8 request influenza test   Labs Review  Results for orders placed during the hospital encounter of 06/19/13  CBC WITH DIFFERENTIAL      Result Value Ref Range   WBC 11.0 (*) 4.0 - 10.5 K/uL   RBC 5.25  4.22 - 5.81 MIL/uL   Hemoglobin 12.8 (*) 13.0 - 17.0 g/dL   HCT 38.9 (*) 39.0 - 52.0 %   MCV 74.1 (*) 78.0 - 100.0 fL   MCH 24.4 (*) 26.0 - 34.0 pg   MCHC 32.9  30.0 - 36.0 g/dL   RDW 13.6  11.5 - 15.5 %   Platelets 243  150 - 400 K/uL   Neutrophils Relative % 69  43 - 77 %   Lymphocytes Relative 11 (*) 12 - 46 %   Monocytes Relative 19 (*) 3 - 12 %   Eosinophils Relative 1  0 - 5 %   Basophils Relative 0  0 - 1 %   Neutro Abs 7.6  1.7 - 7.7 K/uL   Lymphs Abs 1.2  0.7 - 4.0 K/uL   Monocytes Absolute 2.1 (*) 0.1 - 1.0 K/uL   Eosinophils Absolute 0.1  0.0 - 0.7 K/uL   Basophils Absolute 0.0  0.0 - 0.1 K/uL   Smear Review MORPHOLOGY UNREMARKABLE    COMPREHENSIVE METABOLIC PANEL      Result Value Ref Range   Sodium 137  137 - 147 mEq/L   Potassium 3.7  3.7 - 5.3 mEq/L   Chloride 97  96 - 112 mEq/L   CO2 25  19 - 32 mEq/L   Glucose, Bld 92  70 - 99 mg/dL   BUN 22  6 - 23 mg/dL   Creatinine, Ser 0.66  0.50 - 1.35 mg/dL   Calcium 9.6  8.4 - 10.5 mg/dL   Total Protein 9.5 (*) 6.0 - 8.3 g/dL   Albumin 3.8  3.5 - 5.2 g/dL   AST 70 (*) 0 - 37 U/L   ALT  34  0 - 53 U/L   Alkaline Phosphatase 174 (*) 39 - 117 U/L   Total Bilirubin 0.2 (*) 0.3 - 1.2 mg/dL   GFR calc non Af Amer >90  >90 mL/min   GFR calc Af Amer >90  >90 mL/min  URINALYSIS, ROUTINE W REFLEX MICROSCOPIC      Result Value Ref Range   Color, Urine YELLOW  YELLOW   APPearance CLOUDY (*) CLEAR   Specific Gravity, Urine 1.021  1.005 - 1.030   pH 7.5  5.0 - 8.0   Glucose, UA NEGATIVE  NEGATIVE mg/dL   Hgb urine dipstick LARGE (*) NEGATIVE   Bilirubin Urine NEGATIVE  NEGATIVE   Ketones, ur NEGATIVE  NEGATIVE mg/dL   Protein, ur 30 (*) NEGATIVE mg/dL   Urobilinogen, UA 0.2  0.0 - 1.0 mg/dL   Nitrite NEGATIVE  NEGATIVE   Leukocytes, UA MODERATE (*) NEGATIVE  URINE MICROSCOPIC-ADD ON      Result Value Ref Range   Squamous Epithelial / LPF RARE  RARE   WBC, UA 21-50  <3 WBC/hpf   RBC / HPF 11-20  <3 RBC/hpf   Bacteria, UA MANY (*) RARE   Laboratory interpretation all normal except poss UTI, leukocytosis     Imaging Review Dg Chest Portable 1 View  06/19/2013   CLINICAL DATA:  Fever. History of quadriplegia and Parkinson's disease.  EXAM: PORTABLE CHEST - 1 VIEW  COMPARISON:  DG ABD ACUTE W/CHEST dated 04/14/2013; DG CHEST 1V PORT dated 06/14/2012  FINDINGS: The heart size and mediastinal contours are stable. There is stable chronic elevation of the right hemidiaphragm with mild chronic right basilar atelectasis. Overall pulmonary aeration has improved. There is no edema or confluent airspace opacity.  IMPRESSION: Stable chronic right basilar atelectasis. No acute cardiopulmonary process.   Electronically Signed   By: Camie Patience M.D.   On: 06/19/2013 15:38     EKG Interpretation None      MDM  patient with quadriplegia presents with fever. He has a history of urosepsis. He also has coughing however he has a normal chest x-ray. Patient started on penicillin allergic broad-spectrum antibiotics because of him being in a nursing home for over 10 years.    Final diagnoses:   Fever  UTI (urinary tract infection)    Plan admission  Rolland Porter, MD, Alanson Aly, MD 06/19/13 2124

## 2013-06-19 NOTE — ED Notes (Signed)
Pt is from [facility] center. Per EMS, pt has had fever, SOB since this morning--staff took report rectal temp of 102.6. Rehab facility performed chest X-ray, which they report was negative. Pt was given 650 tylenol suppository, EMS is unsure of the time it was given. Pt has hx of quadriplegia, parkinson's disease.

## 2013-06-19 NOTE — Progress Notes (Signed)
Patient ID: [name], male   DOB: 1955-04-05, 58 y.o.   MRN: [id number]   This is an acute visit.  Level of care skilled.  Facility is [facility] complaint-acute visit secondary to fever of unknown origin.  History of present illness.  Patient is a 58 year old male with severe chronic neurologic damage secondary to CNS,.  His total PEG tube dependent.  Family staff noted some increase chest congestion and a low-grade temperature earlier today apparently it was around 101-he was given Tylenol.  Chest x-ray also was ordered which did not really show any acute process.  He empirically had been started on Avelox.  However despite the Tylenol his fever apparently rose to 102.6.  Otherwise his vital signs appear to be stable  He does have a history ofUTI's.  Apparently there has been no recent vomiting although he does have a history of projectile vomiting.  He also has a history of pneumonia but as stated it appears the chest x-ray is not really showing this  Family medical social history has been reviewed discharge note on 06/18/2012--progress note on 05/27/2013.  As well as previous progress notes including 05/03/2013  Medications have been reviewed per [facility].  Review of systems.  Essentially unobtainable secondary to patient's aphasic status however patient does appear to indicate he is not really having any acute pain or shortness of breath but seems to indicate he just does not feel well and has indicated to his mother he would like to go to the hospital  Physical exam.  Temperature not 102.6-pulse 90-respirations 20 blood pressure 135/84.  In general this is a somewhat frail middle-aged male in no acute distress lying in bed he does appear somewhat uncomfortable however with slightly labored breathing at times.  His skin is warm and dry is not diaphoretic.  He does feel somewhat increased warm to touch  Oropharynx appears clear mucous membranes fairly  moist.  Chest is clear to auscultation with shallow air entry  breathing is not overtly labored but does appear slightly labored at times.  Abdomen is soft nontender with positive bowel sounds it is somewhat protuberant PEG appears unremarkeaBLE  Muscle skeletal--he does have diffuse quadriplegia with contractures of wrist and hands at baseline.  Neurologic as stated above he is responsive and does follow verbal commands.  Labs.  05/31/2013.  Sodium 141 potassium 4 BUN 24 creatinine 0.5.  Dilantin level11I.4.   The WBC 6.9 hemoglobin 10.9 platelets 253  Assessment and plan.  #1-fever of unknown origin-patient continues to have a significant temperature-with his history of infections we'll send to the ER for expedient evaluation  This was discussed with his mother who is at bedside and is in agreement.  [id number]

## 2013-06-19 NOTE — ED Notes (Signed)
Bed: KH99 Expected date:  Expected time:  Means of arrival:  Comments: 58 y/o M Nursing home "rectal fever"

## 2013-06-19 NOTE — ED Notes (Signed)
Talked to SNF, tylenol last given at 12:10PM today

## 2013-06-19 NOTE — Progress Notes (Signed)
ANTIBIOTIC CONSULT NOTE - INITIAL  Pharmacy Consult for Vancomycin, aztreonam, levofloxacin Indication: r/o sepsis  Allergies  Allergen Reactions  . Aspirin     unk reaction  . Nsaids     unk reaction  . Penicillins     unk reaction    Patient Measurements: Height: 6\' 2"  (188 cm) Weight: 155 lb (70.308 kg) IBW/kg (Calculated) : 82.2 Last documented weight 72 kg on 05/27/13  Vital Signs: Temp: 102.4 F (39.1 C) (03/25 1411) Temp src: Rectal (03/25 1411) BP: 103/57 mmHg (03/25 1401) Pulse Rate: 106 (03/25 1401)  Labs:  Recent Labs  06/19/13 1555  WBC 11.0*  HGB 12.8*  PLT 243  CREATININE 0.66   Estimated Creatinine Clearance: 102.5 ml/min (by C-G formula based on Cr of 0.66).  Microbiology: No results found for this or any previous visit (from the past 720 hour(s)).  Medical History: Past Medical History  Diagnosis Date  . Quadriplegia   . Depression   . Anemia   . Parkinson's disease   . Seizures   . Adrenal insufficiency     Medications:  Anti-infectives   Start     Dose/Rate Route Frequency Ordered Stop   06/19/13 1630  levofloxacin (LEVAQUIN) IVPB 750 mg     750 mg 100 mL/hr over 90 Minutes Intravenous  Once 06/19/13 1628     06/19/13 1630  aztreonam (AZACTAM) 1 g in dextrose 5 % 50 mL IVPB     1 g 100 mL/hr over 30 Minutes Intravenous  Once 06/19/13 1628     06/19/13 1630  vancomycin (VANCOCIN) IVPB 1000 mg/200 mL premix     1,000 mg 200 mL/hr over 60 Minutes Intravenous  Once 06/19/13 1628       Assessment: 21 yoM admitted on 3/25 with r/o sepsis from [facility].  EMS reports pt has has SOB and fever; PMH includes quadriplegia and parkinson's disease.  Pharmacy consulted to dose vancomycin, aztreonam, and levofloxacin for r/o sepsis.  Tmax: 103.8  WBCs: 11  Renal: SCr 0.66 with CrCl > 100 ml/min  Blood and urine cultures are pending.   Goal of Therapy:  Vancomycin trough level 15-20 mcg/ml  Plan:   Levofloxacin 750mg   IV q24h  Aztreonam 2g IV q8h  Vancomycin 1500mg  IV once, then 1g IV q8h.  Measure Vanc trough at steady state.  Follow up renal fxn and culture results.   Gretta Arab PharmD, BCPS Pager [phone] 06/19/2013 8:17 PM

## 2013-06-20 ENCOUNTER — Inpatient Hospital Stay (HOSPITAL_COMMUNITY): Payer: Medicare Other

## 2013-06-20 DIAGNOSIS — E876 Hypokalemia: Secondary | ICD-10-CM | POA: Diagnosis not present

## 2013-06-20 DIAGNOSIS — D649 Anemia, unspecified: Secondary | ICD-10-CM

## 2013-06-20 DIAGNOSIS — E2749 Other adrenocortical insufficiency: Secondary | ICD-10-CM | POA: Diagnosis not present

## 2013-06-20 DIAGNOSIS — R112 Nausea with vomiting, unspecified: Secondary | ICD-10-CM | POA: Diagnosis not present

## 2013-06-20 DIAGNOSIS — R509 Fever, unspecified: Secondary | ICD-10-CM | POA: Diagnosis not present

## 2013-06-20 DIAGNOSIS — R109 Unspecified abdominal pain: Secondary | ICD-10-CM | POA: Diagnosis not present

## 2013-06-20 DIAGNOSIS — J9819 Other pulmonary collapse: Secondary | ICD-10-CM | POA: Diagnosis not present

## 2013-06-20 LAB — CBC
HEMATOCRIT: 31.7 % — AB (ref 39.0–52.0)
Hemoglobin: 10.2 g/dL — ABNORMAL LOW (ref 13.0–17.0)
MCH: 24.1 pg — ABNORMAL LOW (ref 26.0–34.0)
MCHC: 32.2 g/dL (ref 30.0–36.0)
MCV: 74.8 fL — AB (ref 78.0–100.0)
Platelets: 186 10*3/uL (ref 150–400)
RBC: 4.24 MIL/uL (ref 4.22–5.81)
RDW: 13.8 % (ref 11.5–15.5)
WBC: 9.6 10*3/uL (ref 4.0–10.5)

## 2013-06-20 LAB — COMPREHENSIVE METABOLIC PANEL
ALBUMIN: 2.8 g/dL — AB (ref 3.5–5.2)
ALK PHOS: 119 U/L — AB (ref 39–117)
ALT: 5 U/L (ref 0–53)
AST: 68 U/L — AB (ref 0–37)
BUN: 17 mg/dL (ref 6–23)
CO2: 25 mEq/L (ref 19–32)
Calcium: 8.2 mg/dL — ABNORMAL LOW (ref 8.4–10.5)
Chloride: 100 mEq/L (ref 96–112)
Creatinine, Ser: 0.64 mg/dL (ref 0.50–1.35)
GFR calc Af Amer: >90 mL/min (ref >90)
GFR calc non Af Amer: >90 mL/min (ref >90)
Glucose, Bld: 102 mg/dL — ABNORMAL HIGH (ref 70–99)
Potassium: 3.6 mEq/L — ABNORMAL LOW (ref 3.7–5.3)
Sodium: 135 mEq/L — ABNORMAL LOW (ref 137–147)
TOTAL PROTEIN: 7.3 g/dL (ref 6.0–8.3)
Total Bilirubin: 0.3 mg/dL (ref 0.3–1.2)

## 2013-06-20 LAB — MAGNESIUM: MAGNESIUM: 2 mg/dL (ref 1.5–2.5)

## 2013-06-20 LAB — GLUCOSE, CAPILLARY: Glucose-Capillary: 118 mg/dL — ABNORMAL HIGH (ref 70–99)

## 2013-06-20 LAB — INFLUENZA PANEL BY PCR (TYPE A & B)
H1N1 flu by pcr: NOT DETECTED
Influenza A By PCR: NEGATIVE
Influenza B By PCR: NEGATIVE

## 2013-06-20 LAB — MRSA PCR SCREENING: MRSA BY PCR: POSITIVE — AB

## 2013-06-20 LAB — STREP PNEUMONIAE URINARY ANTIGEN: STREP PNEUMO URINARY ANTIGEN: NEGATIVE

## 2013-06-20 MED ORDER — CHLORHEXIDINE GLUCONATE CLOTH 2 % EX PADS
6.0000 | MEDICATED_PAD | Freq: Every day | CUTANEOUS | Status: AC
  Administered 2013-06-20 – 2013-06-24 (×5): 6 via TOPICAL

## 2013-06-20 MED ORDER — IPRATROPIUM-ALBUTEROL 0.5-2.5 (3) MG/3ML IN SOLN
3.0000 mL | Freq: Two times a day (BID) | RESPIRATORY_TRACT | Status: DC
  Administered 2013-06-20 – 2013-06-24 (×9): 3 mL via RESPIRATORY_TRACT
  Filled 2013-06-20 (×9): qty 3

## 2013-06-20 MED ORDER — SODIUM CHLORIDE 0.9 % IJ SOLN
10.0000 mL | INTRAMUSCULAR | Status: DC | PRN
  Administered 2013-06-21 – 2013-06-24 (×4): 10 mL

## 2013-06-20 MED ORDER — FREE WATER
200.0000 mL | Freq: Four times a day (QID) | Status: DC
  Administered 2013-06-20 – 2013-06-24 (×14): 200 mL

## 2013-06-20 MED ORDER — PRO-STAT SUGAR FREE PO LIQD
30.0000 mL | Freq: Three times a day (TID) | ORAL | Status: DC
  Administered 2013-06-20 – 2013-06-24 (×11): 30 mL
  Filled 2013-06-20 (×15): qty 30

## 2013-06-20 MED ORDER — MUPIROCIN 2 % EX OINT
1.0000 "application " | TOPICAL_OINTMENT | Freq: Two times a day (BID) | CUTANEOUS | Status: AC
  Administered 2013-06-20 – 2013-06-24 (×10): 1 via NASAL
  Filled 2013-06-20 (×2): qty 22

## 2013-06-20 MED ORDER — POTASSIUM CHLORIDE 20 MEQ/15ML (10%) PO LIQD
40.0000 meq | Freq: Once | ORAL | Status: AC
  Administered 2013-06-20: 40 meq
  Filled 2013-06-20: qty 30

## 2013-06-20 MED ORDER — GUAIFENESIN 100 MG/5ML PO SYRP
600.0000 mg | ORAL_SOLUTION | Freq: Four times a day (QID) | ORAL | Status: DC
  Administered 2013-06-20 – 2013-06-24 (×13): 600 mg
  Filled 2013-06-20 (×19): qty 30

## 2013-06-20 MED ORDER — BISACODYL 10 MG RE SUPP
10.0000 mg | Freq: Once | RECTAL | Status: AC
  Administered 2013-06-20: 10 mg via RECTAL
  Filled 2013-06-20: qty 1

## 2013-06-20 MED ORDER — IPRATROPIUM-ALBUTEROL 0.5-2.5 (3) MG/3ML IN SOLN: 3.0000 mL | Freq: Four times a day (QID) | RESPIRATORY_TRACT | Status: DC | PRN

## 2013-06-20 NOTE — Progress Notes (Signed)
Resumed tube feeding, no gastric residuals at this time.

## 2013-06-20 NOTE — Progress Notes (Signed)
OT Cancellation Note  Patient Details Name: Renwick Asman MRN: [id number] DOB: November 22, 1955   Cancelled Treatment:    Reason Eval/Treat Not Completed: OT screened, no needs identified, will sign off. Per chart and PT note, pt was total care at [facility]. No current acute OT needs at this time. Will sign off.  Jules Schick [phone] 06/20/2013, 8:45 AM

## 2013-06-20 NOTE — Progress Notes (Signed)
[facility] PROGRESS NOTE  [name] ZOX:[id number] DOB: 1956-01-09 DOA: 06/19/2013 PCP: [name], MD  Assessment/Plan  Sepsis secondary to pyelonephritis vs. Aspiration pneumonia.  Patient noted to have a temperature 103.8 in the emergency room with a leukocytosis and heart rates in the 108. Fever trending down.   -  F/u UCx -  CXR repeat:  Neg  -  Legionella pending -  S. pneumo neg -  Continue vanc, levofloxacin, aztreonam  Nausea, vomiting, abdominal distension -  KUB:  Constipation without evidence of obstruction -  Bisacodyl suppository x 1   Adrenal insufficiency  -  Continue home regimen of Florinef.  -  Day 2 of 3 of increased prednisone   Seizure disorder Corrected phenytoin level is 21.97. Continue home dose of phenytoin.   Leukocytosis resolving with IV abx.    Quadriplegia/aphasia/dysphagia  Status post PEG tube placement. Continue tube feeds. Continue baclofen.   Iron deficiency anemia, hgb drifting down -   Continue iron supplementation. Continue folic acid.   Parkinson's disease stable.  Continue Sinemet.   Hypokalamia, replete with enteric potassium  Diet:  NPO Access:  PIV IVF:  off Proph:  lovenox  Code Status: full code Family Communication: patient and mother Disposition Plan: pending blood cultures/ucx, plan for abx   Consultants:  None  Procedures:  CXR  KUB  Antibiotics:  vanc 3/26 >>  Aztreonam 3/26 >>  Levofloxacin 3/26 >>   HPI/Subjective:  Denies abdominal pain, however, coughing/vomiting up phlegm intermittently and had some vomiting earlier requiring tube feeds to be held.  Temperature trending down.  No diarrhea.    Objective: Filed Vitals:   06/19/13 2134 06/20/13 0412 06/20/13 1016 06/20/13 1422  BP: 109/56 110/64  115/59  Pulse: 103 98  94  Temp: 101 F (38.3 C) 99.3 F (37.4 C)  98.6 F (37 C)  TempSrc: Oral Oral  Oral  Resp: 22 20  18   Height: 6\' 2"  (1.88 m)     Weight: 72.666 kg  (160 lb 3.2 oz) 73.755 kg (162 lb 9.6 oz)    SpO2: 98% 98% 98% 97%    Intake/Output Summary (Last 24 hours) at 06/20/13 1944 Last data filed at 06/20/13 1430  Gross per 24 hour  Intake 1014.74 ml  Output    650 ml  Net 364.74 ml   Filed Weights   06/19/13 1949 06/19/13 2134 06/20/13 0412  Weight: 70.308 kg (155 lb) 72.666 kg (160 lb 3.2 oz) 73.755 kg (162 lb 9.6 oz)    Exam:   General:  Bed-bound adult male, No acute distress  HEENT:  NCAT, MMM  Cardiovascular:  RRR, nl S1, S2 no mrg, 2+ pulses, warm extremities  Respiratory:  Diminished posteriorly at bases, no focal rales or rhonchi or wheeze, no increased WOB  Abdomen:   NABS, soft, moderately distended, nontender, PEG site c/d/i  MSK:   Increased tone and decreased bulk withi contractures of bilateral wrists, no LEE  Neuro:  Able to vocalize yes and no with some some difficulty and is able to follow some commands.    Data Reviewed: Basic Metabolic Panel:  Recent Labs Lab 06/19/13 1555 06/20/13 0400  NA 137 135*  K 3.7 3.6*  CL 97 100  CO2 25 25  GLUCOSE 92 102*  BUN 22 17  CREATININE 0.66 0.64  CALCIUM 9.6 8.2*  MG  --  2.0   Liver Function Tests:  Recent Labs Lab 06/19/13 1555 06/20/13 0400  AST 70* 68*  ALT 34 5  ALKPHOS 174* 119*  BILITOT 0.2* 0.3  PROT 9.5* 7.3  ALBUMIN 3.8 2.8*   No results found for this basename: LIPASE, AMYLASE,  in the last 168 hours No results found for this basename: AMMONIA,  in the last 168 hours CBC:  Recent Labs Lab 06/19/13 1555 06/20/13 0400  WBC 11.0* 9.6  NEUTROABS 7.6  --   HGB 12.8* 10.2*  HCT 38.9* 31.7*  MCV 74.1* 74.8*  PLT 243 186   Cardiac Enzymes: No results found for this basename: CKTOTAL, CKMB, CKMBINDEX, TROPONINI,  in the last 168 hours BNP (last 3 results) No results found for this basename: PROBNP,  in the last 8760 hours CBG:  Recent Labs Lab 06/20/13 0745  GLUCAP 118*    Recent Results (from the past 240 hour(s))   CULTURE, BLOOD (ROUTINE X 2)     Status: None   Collection Time    06/19/13  3:55 PM      Result Value Ref Range Status   Specimen Description BLOOD RIGHT HAND   Final   Special Requests BOTTLES DRAWN AEROBIC AND ANAEROBIC 5 CC EA   Final   Culture  Setup Time     Final   Value: 06/19/2013 19:18     Performed at [facility]   Culture     Final   Value:        BLOOD CULTURE RECEIVED NO GROWTH TO DATE CULTURE WILL BE HELD FOR 5 DAYS BEFORE ISSUING A FINAL NEGATIVE REPORT     Performed at [facility]   Report Status PENDING   Incomplete  CULTURE, BLOOD (ROUTINE X 2)     Status: None   Collection Time    06/19/13  4:00 PM      Result Value Ref Range Status   Specimen Description BLOOD RIGHT ARM   Final   Special Requests BOTTLES DRAWN AEROBIC AND ANAEROBIC 5 CC EA   Final   Culture  Setup Time     Final   Value: 06/19/2013 19:18     Performed at [facility]   Culture     Final   Value:        BLOOD CULTURE RECEIVED NO GROWTH TO DATE CULTURE WILL BE HELD FOR 5 DAYS BEFORE ISSUING A FINAL NEGATIVE REPORT     Performed at [facility]   Report Status PENDING   Incomplete  MRSA PCR SCREENING     Status: Abnormal   Collection Time    06/19/13  9:30 PM      Result Value Ref Range Status   MRSA by PCR POSITIVE (*) NEGATIVE Final   Comment:            The GeneXpert MRSA Assay (FDA     approved for NASAL specimens     only), is one component of a     comprehensive MRSA colonization     surveillance program. It is not     intended to diagnose MRSA     infection nor to guide or     monitor treatment for     MRSA infections.     RESULT CALLED TO, READ BACK BY AND VERIFIED WITH:     SPOKE WITH ADDISON,C RN [phone] [place],N      Studies: Dg Chest Port 1 View  06/20/2013   CLINICAL DATA:  Cough and fever  EXAM: PORTABLE CHEST - 1 VIEW  COMPARISON:  DG CHEST 1V PORT dated 06/19/2013  FINDINGS: Normal  heart size. Bibasilar atelectasis increased.  Lungs clear. No pneumothorax or pleural effusion. Pulmonary vascularity within normal limits  IMPRESSION: Increased bibasilar atelectasis.   Electronically Signed   By: Maryclare Bean M.D.   On: 06/20/2013 07:47   Dg Chest Portable 1 View  06/19/2013   CLINICAL DATA:  Fever. History of quadriplegia and Parkinson's disease.  EXAM: PORTABLE CHEST - 1 VIEW  COMPARISON:  DG ABD ACUTE W/CHEST dated 04/14/2013; DG CHEST 1V PORT dated 06/14/2012  FINDINGS: The heart size and mediastinal contours are stable. There is stable chronic elevation of the right hemidiaphragm with mild chronic right basilar atelectasis. Overall pulmonary aeration has improved. There is no edema or confluent airspace opacity.  IMPRESSION: Stable chronic right basilar atelectasis. No acute cardiopulmonary process.   Electronically Signed   By: Roxy Horseman M.D.   On: 06/19/2013 15:38   Dg Abd Portable 1v  06/20/2013   CLINICAL DATA:  Abdominal pain and distention.  nausea and vomiting  EXAM: PORTABLE ABDOMEN - 1 VIEW  COMPARISON:  04/14/2013  FINDINGS: No evidence of dilated bowel loops. Stool again seen in the rectosigmoid colon. Percutaneous gastrostomy tube seen overlying the stomach. Pelvic phleboliths noted. No definite radiopaque calculi visualized.  IMPRESSION: Nonobstructive bowel gas pattern.  No acute findings.   Electronically Signed   By: Myles Rosenthal M.D.   On: 06/20/2013 17:41    Scheduled Meds: . aztreonam  2 g Intravenous Q8H  . baclofen  10 mg Per Tube TID  . carbidopa-levodopa  1 tablet Per Tube TID  . Chlorhexidine Gluconate Cloth  6 each Topical Q0600  . enoxaparin (LOVENOX) injection  40 mg Subcutaneous Q24H  . feeding supplement (PRO-STAT SUGAR FREE 64)  30 mL Per Tube TID WC  . ferrous sulfate  300 mg Per Tube Q breakfast  . fludrocortisone  0.1 mg Per Tube QPM  . folic acid  1 mg Per Tube q morning - 10a  . free water  200 mL Per Tube Q6H  . guaiFENesin  1,200 mg Oral BID  . ipratropium-albuterol  3 mL  Nebulization BID  . levofloxacin (LEVAQUIN) IV  750 mg Intravenous Q24H  . loratadine  10 mg Per Tube Daily  . mupirocin ointment  1 application Nasal BID  . olopatadine  1 drop Both Eyes q morning - 10a  . pantoprazole sodium  40 mg Per Tube Daily  . phenytoin  100 mg Per Tube BID  . polyvinyl alcohol  1 drop Both Eyes Daily  . predniSONE  20 mg Per Tube Q breakfast  . saccharomyces boulardii  250 mg Oral BID  . sodium chloride  3 mL Intravenous Q12H  . vancomycin  1,000 mg Intravenous Q8H   Continuous Infusions: . sodium chloride 100 mL/hr at 06/19/13 1608  . sodium chloride    . sodium chloride 100 mL/hr (06/20/13 1640)  . feeding supplement (OSMOLITE 1.5 CAL) 1,000 mL (06/20/13 0500)    Principal Problem:   SIRS (systemic inflammatory response syndrome) Active Problems:   Adrenal insufficiency   Quadriplegia   Parkinson's disease   Seizures   Anemia   Depression   Fever   UTI (lower urinary tract infection)   Iron deficiency anemia, unspecified   Glucocorticoid deficiency   Leukocytosis   Bronchitis   Dysphagia   Aphasia    Time spent: 30 min    Eiden Bagot, [facility]  [facility] Pager [phone]. If 7PM-7AM, please contact night-coverage at [email or web address], password [facility] 06/20/2013, 7:44 PM  LOS: 1  day

## 2013-06-20 NOTE — Progress Notes (Signed)
Clinical Social Work Department BRIEF PSYCHOSOCIAL ASSESSMENT 06/20/2013  Patient:  ROBINSON, BRINKLEY     Account Number:  [id number]     Admit date:  06/19/2013  Clinical Social Worker:  Venia Minks  Date/Time:  06/20/2013 12:00 M  Referred by:  Physician  Date Referred:  06/20/2013 Referred for  SNF Placement   Other Referral:   Interview type:  Patient Other interview type:   mother at bedside    PSYCHOSOCIAL DATA Living Status:  FACILITY Admitted from facility:  [facility] Level of care:  [place] Primary support name:  Ksean Vale Primary support relationship to patient:  PARENT Degree of support available:   good    CURRENT CONCERNS Current Concerns  Post-Acute Placement   Other Concerns:    SOCIAL WORK ASSESSMENT / PLAN CSW met with patient and patient's mother at bedside. patient is alert and appears oriented but has such bad expressive aphasia that it is difficult to understand him. His mother confirms that patient is a resident at [facility] and will return there upon discharge.   Assessment/plan status:   Other assessment/ plan:   Information/referral to community resources:    PATIENT'S/FAMILY'S RESPONSE TO PLAN OF CARE: patients mother confirms that she would like him to return to [facility]. She states that there are some aspects of his care that she does not like but that she knows that there are worst places that he could be.

## 2013-06-20 NOTE — Progress Notes (Signed)
INITIAL NUTRITION ASSESSMENT  DOCUMENTATION CODES Per approved criteria  -Not Applicable   INTERVENTION: -Initiate Osmolite 1.5 @ 30 ml/hr via PEG and increase by 10 ml every 4 hours to goal rate of 55 ml/hr. 30 ml Prostat TID.  At goal rate, tube feeding regimen will provide 1950 kcal, 114 grams of protein, and 922 ml of H2O.  -Hold tube feed 1 hour before and 1 hour after each phenytoin dose -Will continue to monitor  NUTRITION DIAGNOSIS: Inadequate oral intake related to inability to eat as evidenced by NPO status.   Goal: TFto meet >/= 90% of their estimated nutrition needs    Monitor:  TF tolerance, total protein/energy intake, GI profile, labs, weights  Reason for Assessment: MST/New TF  58 y.o. male  Admitting Dx: SIRS (systemic inflammatory response syndrome)  ASSESSMENT: [name] is a 58 y.o. male With history of quadriplegia, dysphagia status post peg tube placement, aphasia, depression, seizure disorder, adrenal insufficiency, Parkinson's disease( which mother states is the first time she is hearing of it), nursing home resident who presents to the ED with a one-day history of fever with a temp of 102.6 at the facility and change in his breathing with a productive cough per mother  -Pt's home TF regimen is TwoCal HN at 55 ml/hr for 20 hours, TF is held for one hour before and one hour after phenytoin is administered. This provides 2200 kcal, and 92 gram protein -RN noted Osmolite 1.5 was started at 55 ml/hr. Pt had vomiting/coughing episode and TF was held. Has been on hold approximately one hour (since 10:40AM) -MD noted also indicated pt with nausea pta.  -Recommend to maintain current rate with lower calorically dense formula. Will recommend conservative advancement and supplement with additional Pro-Stat to meet estimated nutrition needs w/out increasing volume -PICC placed on 3/24 -Weight increased 4 lbs in past 3 weeks -No visible signs of muscle wasting or  subcutaneous fat loss  Height: Ht Readings from Last 1 Encounters:  06/19/13 6\' 2"  (1.88 m)    Weight: Wt Readings from Last 1 Encounters:  06/20/13 162 lb 9.6 oz (73.755 kg)    Ideal Body Weight: 190 lbs  % Ideal Body Weight: 85%  Wt Readings from Last 10 Encounters:  06/20/13 162 lb 9.6 oz (73.755 kg)  05/27/13 158 lb 9.6 oz (71.94 kg)  06/15/12 150 lb 2.1 oz (68.1 kg)  06/13/12 150 lb (68.04 kg)  11/11/11 151 lb 3.8 oz (68.6 kg)    Usual Body Weight: 150-160 lbs per medical records  % Usual Body Weight: 100%  BMI:  Body mass index is 20.87 kg/(m^2).  Estimated Nutritional Needs: Kcal: 1900-2100 Protein: 105-115 gram Fluid: >/=1900 ml/daily  Skin: WDL, no edema  Diet Order: NPO  EDUCATION NEEDS: -No education needs identified at this time   Intake/Output Summary (Last 24 hours) at 06/20/13 1117 Last data filed at 06/20/13 0700  Gross per 24 hour  Intake 1014.74 ml  Output    200 ml  Net 814.74 ml    Last BM: PTA   Labs:   Recent Labs Lab 06/19/13 1555 06/20/13 0400  NA 137 135*  K 3.7 3.6*  CL 97 100  CO2 25 25  BUN 22 17  CREATININE 0.66 0.64  CALCIUM 9.6 8.2*  MG  --  2.0  GLUCOSE 92 102*    CBG (last 3)   Recent Labs  06/20/13 0745  GLUCAP 118*    Scheduled Meds: . aztreonam  2 g Intravenous Q8H  .  baclofen  10 mg Per Tube TID  . carbidopa-levodopa  1 tablet Per Tube TID  . Chlorhexidine Gluconate Cloth  6 each Topical Q0600  . enoxaparin (LOVENOX) injection  40 mg Subcutaneous Q24H  . ferrous sulfate  300 mg Per Tube Q breakfast  . fludrocortisone  0.1 mg Per Tube QPM  . folic acid  1 mg Per Tube q morning - 10a  . guaiFENesin  1,200 mg Oral BID  . ipratropium-albuterol  3 mL Nebulization BID  . levofloxacin (LEVAQUIN) IV  750 mg Intravenous Q24H  . loratadine  10 mg Per Tube Daily  . mupirocin ointment  1 application Nasal BID  . olopatadine  1 drop Both Eyes q morning - 10a  . oseltamivir  75 mg Per Tube BID  .  pantoprazole sodium  40 mg Per Tube Daily  . phenytoin  100 mg Per Tube BID  . polyvinyl alcohol  1 drop Both Eyes Daily  . predniSONE  20 mg Per Tube Q breakfast  . saccharomyces boulardii  250 mg Oral BID  . sodium chloride  3 mL Intravenous Q12H  . vancomycin  1,000 mg Intravenous Q8H    Continuous Infusions: . sodium chloride 100 mL/hr at 06/19/13 1608  . sodium chloride    . sodium chloride 100 mL/hr at 06/19/13 2359  . feeding supplement (OSMOLITE 1.5 CAL) 1,000 mL (06/20/13 0500)    Past Medical History  Diagnosis Date  . Quadriplegia   . Depression   . Anemia   . Parkinson's disease   . Seizures   . Adrenal insufficiency   . Dysphagia 06/19/2013  . Aphasia 06/19/2013    Past Surgical History  Procedure Laterality Date  . Peg tube placement    . Peripherally inserted central catheter insertion      Atlee Abide MS RD LDN Clinical Dietitian [phone]

## 2013-06-20 NOTE — Progress Notes (Signed)
PT Cancellation Note / Screen  Patient Details Name: [name] MRN: [id number] DOB: January 24, 1956   Cancelled Treatment:    Reason Eval/Treat Not Completed: PT screened, no needs identified, will sign off Spoke with RN at [employer] [employer].  She reports pt is total care and does not assist in care. Pt also not in rehab section but resident.  Pt does not appear to be appropriate for skilled acute rehab at this time.   [place] Athanas,KATHrine E 06/20/2013, 8:20 AM Carmelia Bake, PT, DPT 06/20/2013 Pager: [phone]

## 2013-06-20 NOTE — Progress Notes (Signed)
RT treatment protocol assessment completed. Per nursing facility [facility], patient has order for duoneb Q6prn as well as duoneb Q4 X24 hours, which ends at 0200 on 06/20/13. Orders changed according to RT assessment, home medications, and protocol score.

## 2013-06-20 NOTE — Care Management Note (Addendum)
    Page 1 of 1   06/24/2013     12:47:20 PM   CARE MANAGEMENT NOTE 06/24/2013  Patient:  ORIN, EBERWEIN   Account Number:  [id number]  Date Initiated:  06/20/2013  Documentation initiated by:  Dessa Phi  Subjective/Objective Assessment:   58 Y/O M ADMITTED W/FEVER.ES:[id number],LSL TUBE.     Action/Plan:   FROM SNF-[facility].   Anticipated DC Date:  06/24/2013   Anticipated DC Plan:  [place]  CM consult      Choice offered to / List presented to:             Status of service:  Completed, signed off Medicare Important Message given?   (If response is "NO", the following Medicare IM given date fields will be blank) Date Medicare IM given:   Date Additional Medicare IM given:    Discharge Disposition:  [facility]  Per UR Regulation:  Reviewed for med. necessity/level of care/duration of stay  If discussed at [place] of Stay Meetings, dates discussed:    Comments:  06/24/13 Adlai Nieblas RN,BSN NCM [phone] D/C SNF.  06/20/13 Jolleen Seman RN,BSN NCM [place].

## 2013-06-20 NOTE — Progress Notes (Signed)
Peripherally Inserted Central Catheter/Midline Placement  The IV Nurse has discussed with the patient and/or persons authorized to consent for the patient, the purpose of this procedure and the potential benefits and risks involved with this procedure.  The benefits include less needle sticks, lab draws from the catheter and patient may be discharged home with the catheter.  Risks include, but not limited to, infection, bleeding, blood clot (thrombus formation), and puncture of an artery; nerve damage and irregular heat beat.  Alternatives to this procedure were also discussed. Consent given by mother, Lucus Lambertson, via telephone consent.  PICC/Midline Placement Documentation  PICC / Midline Double Lumen 28/36/62 PICC Right Basilic 37 cm 0 cm (Active)  Indication for Insertion or Continuance of Line Limited venous access - need for IV therapy >5 days (PICC only);Poor Vasculature-patient has had multiple peripheral attempts or PIVs lasting less than 24 hours 06/20/2013  9:39 AM  Exposed Catheter (cm) 0 cm 06/20/2013  9:39 AM  Site Assessment Clean;Dry;Intact 06/20/2013  9:39 AM  Lumen #1 Status Flushed;Saline locked;Blood return noted 06/20/2013  9:39 AM  Lumen #2 Status Flushed;Saline locked;Blood return noted 06/20/2013  9:39 AM  Dressing Type Transparent 06/20/2013  9:39 AM  Dressing Status Clean;Dry;Intact;Antimicrobial disc in place 06/20/2013  9:39 AM  Line Care Connections checked and tightened 06/20/2013  9:39 AM  Line Adjustment (NICU/IV Team Only) No 06/20/2013  9:39 AM  Dressing Intervention New dressing 06/20/2013  9:39 AM       Rolena Infante 06/20/2013, 9:41 AM

## 2013-06-20 NOTE — Progress Notes (Signed)
Vomited 2x with tube feeds, Tube feeding placed on hold, PRN meds given, secretions suctioned, MD made aware. Gastric residuals  Checked 120 cc.

## 2013-06-21 DIAGNOSIS — D509 Iron deficiency anemia, unspecified: Secondary | ICD-10-CM

## 2013-06-21 DIAGNOSIS — R509 Fever, unspecified: Secondary | ICD-10-CM | POA: Diagnosis not present

## 2013-06-21 DIAGNOSIS — E2749 Other adrenocortical insufficiency: Secondary | ICD-10-CM | POA: Diagnosis not present

## 2013-06-21 DIAGNOSIS — R112 Nausea with vomiting, unspecified: Secondary | ICD-10-CM

## 2013-06-21 LAB — COMPREHENSIVE METABOLIC PANEL
ALT: 24 U/L (ref 0–53)
AST: 55 U/L — AB (ref 0–37)
Albumin: 2.6 g/dL — ABNORMAL LOW (ref 3.5–5.2)
Alkaline Phosphatase: 104 U/L (ref 39–117)
BUN: 11 mg/dL (ref 6–23)
CALCIUM: 8.1 mg/dL — AB (ref 8.4–10.5)
CO2: 24 meq/L (ref 19–32)
CREATININE: 0.44 mg/dL — AB (ref 0.50–1.35)
Chloride: 98 mEq/L (ref 96–112)
GFR calc non Af Amer: >90 mL/min (ref >90)
GLUCOSE: 144 mg/dL — AB (ref 70–99)
Potassium: 3.2 mEq/L — ABNORMAL LOW (ref 3.7–5.3)
Sodium: 134 mEq/L — ABNORMAL LOW (ref 137–147)
TOTAL PROTEIN: 6.7 g/dL (ref 6.0–8.3)
Total Bilirubin: <0.2 mg/dL — ABNORMAL LOW (ref 0.3–1.2)

## 2013-06-21 LAB — LEGIONELLA ANTIGEN, URINE: LEGIONELLA ANTIGEN, URINE: NEGATIVE

## 2013-06-21 LAB — URINE CULTURE: Colony Count: 2000

## 2013-06-21 LAB — VANCOMYCIN, TROUGH: VANCOMYCIN TR: 14.4 ug/mL (ref 10.0–20.0)

## 2013-06-21 LAB — CBC
HCT: 29 % — ABNORMAL LOW (ref 39.0–52.0)
Hemoglobin: 9.4 g/dL — ABNORMAL LOW (ref 13.0–17.0)
MCH: 24 pg — AB (ref 26.0–34.0)
MCHC: 32.4 g/dL (ref 30.0–36.0)
MCV: 74.2 fL — AB (ref 78.0–100.0)
PLATELETS: 181 10*3/uL (ref 150–400)
RBC: 3.91 MIL/uL — AB (ref 4.22–5.81)
RDW: 13.9 % (ref 11.5–15.5)
WBC: 10.5 10*3/uL (ref 4.0–10.5)

## 2013-06-21 LAB — LIPASE, BLOOD: Lipase: 35 U/L (ref 11–59)

## 2013-06-21 LAB — GLUCOSE, CAPILLARY: Glucose-Capillary: 119 mg/dL — ABNORMAL HIGH (ref 70–99)

## 2013-06-21 LAB — OCCULT BLOOD X 1 CARD TO LAB, STOOL: Fecal Occult Bld: POSITIVE — AB

## 2013-06-21 MED ORDER — POTASSIUM CHLORIDE 20 MEQ/15ML (10%) PO LIQD
40.0000 meq | Freq: Once | ORAL | Status: AC
  Administered 2013-06-21: 40 meq
  Filled 2013-06-21 (×2): qty 30

## 2013-06-21 NOTE — Progress Notes (Signed)
[facility] PROGRESS NOTE  [name] NWG:[id number] DOB: January 17, 1956 DOA: 06/19/2013 PCP: [name], MD  Assessment/Plan  Sepsis secondary to pyelonephritis vs. Aspiration pneumonia.  Patient noted to have a temperature 103.8 in the emergency room with a leukocytosis and heart rates in the 108. Fever trending down.   -  F/u UCx -  CXR repeat:  Neg  -  Legionella pending -  S. pneumo neg -  Continue vanc, levofloxacin, aztreonam -  If blood cx negative at 48 hours (this evening, okay to d/c vancomycin)  Nausea, vomiting, abdominal distension, resolving after BM -  KUB:  Constipation without evidence of obstruction -  Continue stool softners   Adrenal insufficiency  -  Continue home regimen of Florinef.  -  Day 3 of 3 of increased prednisone -  Resume home dose prednisone 3/28  Seizure disorder Corrected phenytoin level is 21.97. Continue home dose of phenytoin.   Leukocytosis resolving with IV abx.    Quadriplegia/aphasia/dysphagia  Status post PEG tube placement. Continue tube feeds. Continue baclofen.   Iron deficiency anemia, hgb drifting down.  Occult stool + -   Recommend GI follow up -   Start PPI in case some irritation of stomach from PEG -   Continue iron supplementation. Continue folic acid.  -   F/u AML  Parkinson's disease stable.  Continue Sinemet.   Diet:  NPO Access:  PIV IVF:  off Proph:  lovenox  Code Status: full code Family Communication: patient and mother Disposition Plan: pending blood cultures/ucx, plan for abx   Consultants:  None  Procedures:  CXR  KUB  Antibiotics:  vanc 3/26 >>  Aztreonam 3/26 >>  Levofloxacin 3/26 >>   HPI/Subjective:  Cough productive of phlegm.  Abdominal distension still present despite BM yesterday, but improved.  Temperature trending down  Objective: Filed Vitals:   06/20/13 1422 06/20/13 2026 06/20/13 2151 06/21/13 0517  BP: 115/59  103/54 104/54  Pulse: 94  93 83  Temp:  98.6 F (37 C)  99 F (37.2 C) 98.1 F (36.7 C)  TempSrc: Oral  Oral Oral  Resp: 18  18 24   Height:      Weight:    74.299 kg (163 lb 12.8 oz)  SpO2: 97% 93% 96% 97%    Intake/Output Summary (Last 24 hours) at 06/21/13 0837 Last data filed at 06/21/13 0700  Gross per 24 hour  Intake    740 ml  Output   1050 ml  Net   -310 ml   Filed Weights   06/19/13 2134 06/20/13 0412 06/21/13 0517  Weight: 72.666 kg (160 lb 3.2 oz) 73.755 kg (162 lb 9.6 oz) 74.299 kg (163 lb 12.8 oz)    Exam:   General:  Bed-bound adult male, No acute distres  HEENT:  NCAT, MMM  Cardiovascular:  RRR, nl S1, S2 no mrg, 2+ pulses, warm extremities  Respiratory:  Diminished posteriorly at bases, no focal rales or wheeze, course rhonchi throughout no increased WOB  Abdomen:   NABS, soft, moderately distended, nontender, PEG site c/d/i  MSK:   Increased tone and decreased bulk with contractures of bilateral wrists, no LEE  Neuro:  Able to vocalize yes and no with some some difficulty and is able to follow some commands.    Data Reviewed: Basic Metabolic Panel:  Recent Labs Lab 06/19/13 1555 06/20/13 0400  NA 137 135*  K 3.7 3.6*  CL 97 100  CO2 25 25  GLUCOSE 92 102*  BUN 22 17  CREATININE 0.66 0.64  CALCIUM 9.6 8.2*  MG  --  2.0   Liver Function Tests:  Recent Labs Lab 06/19/13 1555 06/20/13 0400  AST 70* 68*  ALT 34 5  ALKPHOS 174* 119*  BILITOT 0.2* 0.3  PROT 9.5* 7.3  ALBUMIN 3.8 2.8*   No results found for this basename: LIPASE, AMYLASE,  in the last 168 hours No results found for this basename: AMMONIA,  in the last 168 hours CBC:  Recent Labs Lab 06/19/13 1555 06/20/13 0400  WBC 11.0* 9.6  NEUTROABS 7.6  --   HGB 12.8* 10.2*  HCT 38.9* 31.7*  MCV 74.1* 74.8*  PLT 243 186   Cardiac Enzymes: No results found for this basename: CKTOTAL, CKMB, CKMBINDEX, TROPONINI,  in the last 168 hours BNP (last 3 results) No results found for this basename: PROBNP,  in the  last 8760 hours CBG:  Recent Labs Lab 06/20/13 0745 06/21/13 0811  GLUCAP 118* 119*    Recent Results (from the past 240 hour(s))  CULTURE, BLOOD (ROUTINE X 2)     Status: None   Collection Time    06/19/13  3:55 PM      Result Value Ref Range Status   Specimen Description BLOOD RIGHT HAND   Final   Special Requests BOTTLES DRAWN AEROBIC AND ANAEROBIC 5 CC EA   Final   Culture  Setup Time     Final   Value: 06/19/2013 19:18     Performed at [facility]   Culture     Final   Value:        BLOOD CULTURE RECEIVED NO GROWTH TO DATE CULTURE WILL BE HELD FOR 5 DAYS BEFORE ISSUING A FINAL NEGATIVE REPORT     Performed at [facility]   Report Status PENDING   Incomplete  CULTURE, BLOOD (ROUTINE X 2)     Status: None   Collection Time    06/19/13  4:00 PM      Result Value Ref Range Status   Specimen Description BLOOD RIGHT ARM   Final   Special Requests BOTTLES DRAWN AEROBIC AND ANAEROBIC 5 CC EA   Final   Culture  Setup Time     Final   Value: 06/19/2013 19:18     Performed at [facility]   Culture     Final   Value:        BLOOD CULTURE RECEIVED NO GROWTH TO DATE CULTURE WILL BE HELD FOR 5 DAYS BEFORE ISSUING A FINAL NEGATIVE REPORT     Performed at [facility]   Report Status PENDING   Incomplete  MRSA PCR SCREENING     Status: Abnormal   Collection Time    06/19/13  9:30 PM      Result Value Ref Range Status   MRSA by PCR POSITIVE (*) NEGATIVE Final   Comment:            The GeneXpert MRSA Assay (FDA     approved for NASAL specimens     only), is one component of a     comprehensive MRSA colonization     surveillance program. It is not     intended to diagnose MRSA     infection nor to guide or     monitor treatment for     MRSA infections.     RESULT CALLED TO, READ BACK BY AND VERIFIED WITH:     SPOKE WITH ADDISON,C RN [phone] [id number] Marian Sorrow  Studies: Dg Chest Port 1 View  06/20/2013   CLINICAL DATA:  Cough and fever   EXAM: PORTABLE CHEST - 1 VIEW  COMPARISON:  DG CHEST 1V PORT dated 06/19/2013  FINDINGS: Normal heart size. Bibasilar atelectasis increased. Lungs clear. No pneumothorax or pleural effusion. Pulmonary vascularity within normal limits  IMPRESSION: Increased bibasilar atelectasis.   Electronically Signed   By: Maryclare Bean M.D.   On: 06/20/2013 07:47   Dg Chest Portable 1 View  06/19/2013   CLINICAL DATA:  Fever. History of quadriplegia and Parkinson's disease.  EXAM: PORTABLE CHEST - 1 VIEW  COMPARISON:  DG ABD ACUTE W/CHEST dated 04/14/2013; DG CHEST 1V PORT dated 06/14/2012  FINDINGS: The heart size and mediastinal contours are stable. There is stable chronic elevation of the right hemidiaphragm with mild chronic right basilar atelectasis. Overall pulmonary aeration has improved. There is no edema or confluent airspace opacity.  IMPRESSION: Stable chronic right basilar atelectasis. No acute cardiopulmonary process.   Electronically Signed   By: Roxy Horseman M.D.   On: 06/19/2013 15:38   Dg Abd Portable 1v  06/20/2013   CLINICAL DATA:  Abdominal pain and distention.  nausea and vomiting  EXAM: PORTABLE ABDOMEN - 1 VIEW  COMPARISON:  04/14/2013  FINDINGS: No evidence of dilated bowel loops. Stool again seen in the rectosigmoid colon. Percutaneous gastrostomy tube seen overlying the stomach. Pelvic phleboliths noted. No definite radiopaque calculi visualized.  IMPRESSION: Nonobstructive bowel gas pattern.  No acute findings.   Electronically Signed   By: Myles Rosenthal M.D.   On: 06/20/2013 17:41    Scheduled Meds: . aztreonam  2 g Intravenous Q8H  . baclofen  10 mg Per Tube TID  . carbidopa-levodopa  1 tablet Per Tube TID  . Chlorhexidine Gluconate Cloth  6 each Topical Q0600  . enoxaparin (LOVENOX) injection  40 mg Subcutaneous Q24H  . feeding supplement (PRO-STAT SUGAR FREE 64)  30 mL Per Tube TID WC  . ferrous sulfate  300 mg Per Tube Q breakfast  . fludrocortisone  0.1 mg Per Tube QPM  . folic acid  1 mg  Per Tube q morning - 10a  . free water  200 mL Per Tube Q6H  . guaifenesin  600 mg Per Tube Q6H  . ipratropium-albuterol  3 mL Nebulization BID  . levofloxacin (LEVAQUIN) IV  750 mg Intravenous Q24H  . loratadine  10 mg Per Tube Daily  . mupirocin ointment  1 application Nasal BID  . olopatadine  1 drop Both Eyes q morning - 10a  . pantoprazole sodium  40 mg Per Tube Daily  . phenytoin  100 mg Per Tube BID  . polyvinyl alcohol  1 drop Both Eyes Daily  . predniSONE  20 mg Per Tube Q breakfast  . saccharomyces boulardii  250 mg Oral BID  . sodium chloride  3 mL Intravenous Q12H  . vancomycin  1,000 mg Intravenous Q8H   Continuous Infusions: . feeding supplement (OSMOLITE 1.5 CAL) 1,000 mL (06/21/13 0502)    Principal Problem:   SIRS (systemic inflammatory response syndrome) Active Problems:   Adrenal insufficiency   Quadriplegia   Parkinson's disease   Seizures   Anemia   Depression   Fever   UTI (lower urinary tract infection)   Iron deficiency anemia, unspecified   Glucocorticoid deficiency   Leukocytosis   Bronchitis   Dysphagia   Aphasia    Time spent: 30 min    Zekiah Coen, [facility]  [facility] Pager [phone]. If 7PM-7AM,  please contact night-coverage at [email or web address], password [facility] 06/21/2013, 8:37 AM  LOS: 2 days

## 2013-06-21 NOTE — Progress Notes (Signed)
[place] for Vancomycin, aztreonam, levofloxacin Indication: r/o sepsis  Allergies  Allergen Reactions  . Aspirin     unk reaction  . Nsaids     unk reaction  . Penicillins     unk reaction    Patient Measurements: Height: 6\' 2"  (188 cm) Weight: 163 lb 12.8 oz (74.299 kg) IBW/kg (Calculated) : 82.2 Last documented weight 72 kg on 05/27/13  Vital Signs: Temp: 98.1 F (36.7 C) (03/27 0517) Temp src: Oral (03/27 0517) BP: 104/54 mmHg (03/27 0517) Pulse Rate: 83 (03/27 0517)  Labs:  Recent Labs  06/19/13 1555 06/20/13 0400  WBC 11.0* 9.6  HGB 12.8* 10.2*  PLT 243 186  CREATININE 0.66 0.64   Estimated Creatinine Clearance: 108.4 ml/min (by C-G formula based on Cr of 0.64).  Microbiology: Recent Results (from the past 720 hour(s))  CULTURE, BLOOD (ROUTINE X 2)     Status: None   Collection Time    06/19/13  3:55 PM      Result Value Ref Range Status   Specimen Description BLOOD RIGHT HAND   Final   Special Requests BOTTLES DRAWN AEROBIC AND ANAEROBIC 5 CC EA   Final   Culture  Setup Time     Final   Value: 06/19/2013 19:18     Performed at [facility]   Culture     Final   Value:        BLOOD CULTURE RECEIVED NO GROWTH TO DATE CULTURE WILL BE HELD FOR 5 DAYS BEFORE ISSUING A FINAL NEGATIVE REPORT     Performed at [facility]   Report Status PENDING   Incomplete  CULTURE, BLOOD (ROUTINE X 2)     Status: None   Collection Time    06/19/13  4:00 PM      Result Value Ref Range Status   Specimen Description BLOOD RIGHT ARM   Final   Special Requests BOTTLES DRAWN AEROBIC AND ANAEROBIC 5 CC EA   Final   Culture  Setup Time     Final   Value: 06/19/2013 19:18     Performed at [facility]   Culture     Final   Value:        BLOOD CULTURE RECEIVED NO GROWTH TO DATE CULTURE WILL BE HELD FOR 5 DAYS BEFORE ISSUING A FINAL NEGATIVE REPORT     Performed at [facility]   Report Status PENDING    Incomplete  MRSA PCR SCREENING     Status: Abnormal   Collection Time    06/19/13  9:30 PM      Result Value Ref Range Status   MRSA by PCR POSITIVE (*) NEGATIVE Final   Comment:            The GeneXpert MRSA Assay (FDA     approved for NASAL specimens     only), is one component of a     comprehensive MRSA colonization     surveillance program. It is not     intended to diagnose MRSA     infection nor to guide or     monitor treatment for     MRSA infections.     RESULT CALLED TO, READ BACK BY AND VERIFIED WITH:     SPOKE WITH ADDISON,C RN [phone] COVINGTON,N     Medical History: Past Medical History  Diagnosis Date  . Quadriplegia   . Depression   . Anemia   . Parkinson's disease   .  Seizures   . Adrenal insufficiency   . Dysphagia 06/19/2013  . Aphasia 06/19/2013    Medications:  Anti-infectives   Start     Dose/Rate Route Frequency Ordered Stop   06/20/13 1600  levofloxacin (LEVAQUIN) IVPB 750 mg     750 mg 100 mL/hr over 90 Minutes Intravenous Every 24 hours 06/19/13 2030     06/20/13 0800  vancomycin (VANCOCIN) IVPB 1000 mg/200 mL premix     1,000 mg 200 mL/hr over 60 Minutes Intravenous Every 8 hours 06/19/13 2030     06/20/13 0200  aztreonam (AZACTAM) 2 g in dextrose 5 % 50 mL IVPB     2 g 100 mL/hr over 30 Minutes Intravenous Every 8 hours 06/19/13 2030     06/19/13 2200  oseltamivir (TAMIFLU) 6 MG/ML suspension 75 mg  Status:  Discontinued     75 mg Per Tube 2 times daily 06/19/13 1924 06/20/13 1542   06/19/13 2130  levofloxacin (LEVAQUIN) IVPB 500 mg  Status:  Discontinued     500 mg 100 mL/hr over 60 Minutes Intravenous Every 24 hours 06/19/13 2118 06/19/13 2130   06/19/13 1830  vancomycin (VANCOCIN) 1,500 mg in sodium chloride 0.9 % 500 mL IVPB     1,500 mg 250 mL/hr over 120 Minutes Intravenous  Once 06/19/13 1827 06/19/13 2227   06/19/13 1645  aztreonam (AZACTAM) 2 g in dextrose 5 % 50 mL IVPB     2 g 100 mL/hr over 30 Minutes Intravenous  Once  06/19/13 1633 06/19/13 1926   06/19/13 1630  levofloxacin (LEVAQUIN) IVPB 750 mg     750 mg 100 mL/hr over 90 Minutes Intravenous  Once 06/19/13 1628 06/19/13 1858   06/19/13 1630  aztreonam (AZACTAM) 1 g in dextrose 5 % 50 mL IVPB  Status:  Discontinued     1 g 100 mL/hr over 30 Minutes Intravenous  Once 06/19/13 1628 06/19/13 1633   06/19/13 1630  vancomycin (VANCOCIN) IVPB 1000 mg/200 mL premix  Status:  Discontinued     1,000 mg 200 mL/hr over 60 Minutes Intravenous  Once 06/19/13 1628 06/19/13 1827     Assessment: 54 yoM admitted on 3/25 with r/o sepsis from [place] [facility].  EMS reports pt has has SOB and fever; PMH includes quadriplegia and parkinson's disease.  Pharmacy consulted to dose vancomycin, aztreonam, and levofloxacin for r/o sepsis.  D3 Antibiotics 3/25 >> Tamiflu >> 3/26 3/25 >> Aztreonam >> 3/25 >> Vanc >>  3/25 >> Levaquin >>  Tmax: Now AF WBCs: Now WNL Renal: SCr stable, CrCl > 100 ml/min (CG/N)  3/25 blood x2: NGTD 3/25 urine: collected  3/25 influenza panel: negative 3/26 strep neg/ legionella in process  3/27 Vancomycin Trough @ 0700 = 14.4, anticipate further accumulation, no change necessary  Goal of Therapy:  Vancomycin trough level 15-20 mcg/ml  Plan:   Continue Levofloxacin 750mg  IV q24h, Aztreonam 2g IV q8h, Vancomycin 1500mg  IV once, then 1g IV q8h.  Follow up renal fxn and culture results, narrowing of antibiotics and duration of therapy  Ralene Bathe, PharmD, BCPS 06/21/2013, 8:09 AM  Pager: [phone]

## 2013-06-22 ENCOUNTER — Inpatient Hospital Stay (HOSPITAL_COMMUNITY): Payer: Medicare Other

## 2013-06-22 DIAGNOSIS — K7689 Other specified diseases of liver: Secondary | ICD-10-CM | POA: Diagnosis not present

## 2013-06-22 DIAGNOSIS — N390 Urinary tract infection, site not specified: Secondary | ICD-10-CM | POA: Diagnosis not present

## 2013-06-22 DIAGNOSIS — R509 Fever, unspecified: Secondary | ICD-10-CM | POA: Diagnosis not present

## 2013-06-22 DIAGNOSIS — N12 Tubulo-interstitial nephritis, not specified as acute or chronic: Secondary | ICD-10-CM | POA: Diagnosis present

## 2013-06-22 DIAGNOSIS — E2749 Other adrenocortical insufficiency: Secondary | ICD-10-CM | POA: Diagnosis not present

## 2013-06-22 DIAGNOSIS — B9620 Unspecified Escherichia coli [E. coli] as the cause of diseases classified elsewhere: Secondary | ICD-10-CM | POA: Diagnosis present

## 2013-06-22 DIAGNOSIS — N10 Acute tubulo-interstitial nephritis: Secondary | ICD-10-CM

## 2013-06-22 LAB — URINE CULTURE
Colony Count: >=100000
SPECIAL REQUESTS: NORMAL

## 2013-06-22 LAB — BASIC METABOLIC PANEL
BUN: 13 mg/dL (ref 6–23)
CO2: 27 mEq/L (ref 19–32)
Calcium: 8.5 mg/dL (ref 8.4–10.5)
Chloride: 102 mEq/L (ref 96–112)
Creatinine, Ser: 0.39 mg/dL — ABNORMAL LOW (ref 0.50–1.35)
GFR calc non Af Amer: >90 mL/min (ref >90)
Glucose, Bld: 115 mg/dL — ABNORMAL HIGH (ref 70–99)
POTASSIUM: 3.3 meq/L — AB (ref 3.7–5.3)
SODIUM: 138 meq/L (ref 137–147)

## 2013-06-22 LAB — CBC
HCT: 29.8 % — ABNORMAL LOW (ref 39.0–52.0)
HEMOGLOBIN: 9.4 g/dL — AB (ref 13.0–17.0)
MCH: 23.8 pg — ABNORMAL LOW (ref 26.0–34.0)
MCHC: 31.5 g/dL (ref 30.0–36.0)
MCV: 75.4 fL — ABNORMAL LOW (ref 78.0–100.0)
Platelets: 187 10*3/uL (ref 150–400)
RBC: 3.95 MIL/uL — AB (ref 4.22–5.81)
RDW: 13.9 % (ref 11.5–15.5)
WBC: 8.7 10*3/uL (ref 4.0–10.5)

## 2013-06-22 LAB — GLUCOSE, CAPILLARY: GLUCOSE-CAPILLARY: 111 mg/dL — AB (ref 70–99)

## 2013-06-22 MED ORDER — PHENYTOIN SODIUM 50 MG/ML IJ SOLN
100.0000 mg | Freq: Two times a day (BID) | INTRAMUSCULAR | Status: DC
  Administered 2013-06-22 – 2013-06-24 (×5): 100 mg via INTRAVENOUS
  Filled 2013-06-22 (×8): qty 2

## 2013-06-22 MED ORDER — IOHEXOL 300 MG/ML  SOLN
50.0000 mL | Freq: Once | INTRAMUSCULAR | Status: AC | PRN
  Administered 2013-06-22: 50 mL via ORAL

## 2013-06-22 MED ORDER — IOHEXOL 300 MG/ML  SOLN
100.0000 mL | Freq: Once | INTRAMUSCULAR | Status: AC | PRN
  Administered 2013-06-22: 100 mL via INTRAVENOUS

## 2013-06-22 MED ORDER — METOCLOPRAMIDE HCL 5 MG/ML IJ SOLN
5.0000 mg | Freq: Four times a day (QID) | INTRAMUSCULAR | Status: DC | PRN
  Administered 2013-06-23 – 2013-06-24 (×3): 5 mg via INTRAVENOUS
  Filled 2013-06-22 (×3): qty 2

## 2013-06-22 MED ORDER — DEXTROSE 5 % IV SOLN
1.0000 g | INTRAVENOUS | Status: DC
  Administered 2013-06-22 – 2013-06-24 (×3): 1 g via INTRAVENOUS
  Filled 2013-06-22 (×3): qty 10

## 2013-06-22 MED ORDER — CEPHALEXIN 250 MG/5ML PO SUSR
500.0000 mg | Freq: Four times a day (QID) | ORAL | Status: DC
  Filled 2013-06-22 (×4): qty 10

## 2013-06-22 MED ORDER — OSMOLITE 1.5 CAL PO LIQD
1000.0000 mL | ORAL | Status: DC
  Administered 2013-06-22 – 2013-06-24 (×2): 1000 mL
  Filled 2013-06-22 (×3): qty 1000

## 2013-06-22 MED ORDER — PREDNISONE 5 MG/5ML PO SOLN
10.0000 mg | Freq: Every day | ORAL | Status: DC
  Administered 2013-06-23 – 2013-06-24 (×2): 10 mg
  Filled 2013-06-22 (×3): qty 10

## 2013-06-22 MED ORDER — POTASSIUM CHLORIDE 20 MEQ/15ML (10%) PO LIQD
40.0000 meq | Freq: Every day | ORAL | Status: DC
  Administered 2013-06-23 – 2013-06-24 (×2): 40 meq
  Filled 2013-06-22 (×3): qty 30

## 2013-06-22 NOTE — Progress Notes (Addendum)
[facility] PROGRESS NOTE  [name] WUJ:[id number] DOB: 23-Aug-1955 DOA: 06/19/2013 PCP: [name], MD  Assessment/Plan  Sepsis secondary to MDR E. Coli pyelonephritis.  Patient noted to have a temperature 103.8 in the emergency room with a leukocytosis and heart rates in the 108. Fever and leukocytosis resolved -  CXR repeat:  Neg  -  Legionella neg -  S. pneumo neg -  D/c current abx and change to keflex/ceftriaxone.  Case discussed with Dr. Ninetta Lights, ID  Nausea, vomiting, abdominal distension. Did not resolve after improvement of constipation without evidence of obstruction -  LFTs and lipase stable -  NPO/hold tube feeds -  CT abd/pelvis with contrast   Adrenal insufficiency  -  Continue Florinef.  -  Resume home dose prednisone 3/28  Seizure disorder Corrected phenytoin level is 21.97. Continue home dose of phenytoin.   Leukocytosis resolved  Quadriplegia/aphasia/dysphagia  -  Very PEG tube positioning.  - Continue baclofen.   Iron deficiency anemia, hgb drifting stable near baseline 9.4mg /dl.  Occult stool + -   Recommend GI follow up -   Continue PPI  -   Continue iron supplementation. Continue folic acid.   Parkinson's disease stable.  Continue Sinemet.   Hypokalemia, due to GI losses, replete with enteric potassium  Diet:  NPO Access:  PIV IVF:  off Proph:  lovenox  Code Status: full code Family Communication: patient and mother Disposition Plan: pending CT scan/improving N/V and abdominal distension   Consultants:  None  Procedures:  CXR  KUB  Antibiotics:  vanc 3/26 >> 3/28  Aztreonam 3/26 >> 3/28  Levofloxacin 3/26 >> 3/28  Keflex 3/28   HPI/Subjective:  Abdominal distension with persistent emesis.  No stools in 2 days.    Objective: Filed Vitals:   06/21/13 2100 06/22/13 0409 06/22/13 0415 06/22/13 0821  BP: 121/68 117/75    Pulse: 80 77    Temp: 98.7 F (37.1 C) 98.3 F (36.8 C)    TempSrc: Axillary  Oral    Resp: 16 20    Height:      Weight:   72.848 kg (160 lb 9.6 oz)   SpO2: 99% 98%  98%    Intake/Output Summary (Last 24 hours) at 06/22/13 1101 Last data filed at 06/22/13 0600  Gross per 24 hour  Intake 1795.58 ml  Output   2250 ml  Net -454.42 ml   Filed Weights   06/20/13 0412 06/21/13 0517 06/22/13 0415  Weight: 73.755 kg (162 lb 9.6 oz) 74.299 kg (163 lb 12.8 oz) 72.848 kg (160 lb 9.6 oz)    Exam:   General:  Bed-bound adult male, No acute distres  HEENT:  NCAT, MMM  Cardiovascular:  RRR, nl S1, S2 no mrg, 2+ pulses, warm extremities  Respiratory:  Diminished posteriorly at bases, no focal rales or wheeze, course rhonchi throughout no increased WOB  Abdomen:   NABS, soft, moderately distended, nontender, PEG site c/d/i  MSK:   Increased tone and decreased bulk with contractures of bilateral wrists, no LEE  Neuro:  Able to vocalize yes and no with some some difficulty and is able to follow some commands.    Data Reviewed: Basic Metabolic Panel:  Recent Labs Lab 06/19/13 1555 06/20/13 0400 06/21/13 0910 06/22/13 0643  NA 137 135* 134* 138  K 3.7 3.6* 3.2* 3.3*  CL 97 100 98 102  CO2 25 25 24 27   GLUCOSE 92 102* 144* 115*  BUN 22 17 11 13   CREATININE 0.66 0.64 0.44*  0.39*  CALCIUM 9.6 8.2* 8.1* 8.5  MG  --  2.0  --   --    Liver Function Tests:  Recent Labs Lab 06/19/13 1555 06/20/13 0400 06/21/13 0910  AST 70* 68* 55*  ALT 34 5 24  ALKPHOS 174* 119* 104  BILITOT 0.2* 0.3 <0.2*  PROT 9.5* 7.3 6.7  ALBUMIN 3.8 2.8* 2.6*    Recent Labs Lab 06/21/13 0910  LIPASE 35   No results found for this basename: AMMONIA,  in the last 168 hours CBC:  Recent Labs Lab 06/19/13 1555 06/20/13 0400 06/21/13 0858 06/22/13 0643  WBC 11.0* 9.6 10.5 8.7  NEUTROABS 7.6  --   --   --   HGB 12.8* 10.2* 9.4* 9.4*  HCT 38.9* 31.7* 29.0* 29.8*  MCV 74.1* 74.8* 74.2* 75.4*  PLT 243 186 181 187   Cardiac Enzymes: No results found for this  basename: CKTOTAL, CKMB, CKMBINDEX, TROPONINI,  in the last 168 hours BNP (last 3 results) No results found for this basename: PROBNP,  in the last 8760 hours CBG:  Recent Labs Lab 06/20/13 0745 06/21/13 0811 06/22/13 0736  GLUCAP 118* 119* 111*    Recent Results (from the past 240 hour(s))  URINE CULTURE     Status: None   Collection Time    06/19/13  3:45 PM      Result Value Ref Range Status   Specimen Description URINE, CATHETERIZED   Final   Special Requests Normal   Final   Culture  Setup Time     Final   Value: 06/20/2013 06:07     Performed at [facility] Count     Final   Value: >=100,000 COLONIES/ML     Performed at [facility]   Culture     Final   Value: ESCHERICHIA COLI     Performed at [facility]   Report Status 06/22/2013 FINAL   Final   Organism ID, Bacteria ESCHERICHIA COLI   Final  CULTURE, BLOOD (ROUTINE X 2)     Status: None   Collection Time    06/19/13  3:55 PM      Result Value Ref Range Status   Specimen Description BLOOD RIGHT HAND   Final   Special Requests BOTTLES DRAWN AEROBIC AND ANAEROBIC 5 CC EA   Final   Culture  Setup Time     Final   Value: 06/19/2013 19:18     Performed at [facility]   Culture     Final   Value:        BLOOD CULTURE RECEIVED NO GROWTH TO DATE CULTURE WILL BE HELD FOR 5 DAYS BEFORE ISSUING A FINAL NEGATIVE REPORT     Performed at [facility]   Report Status PENDING   Incomplete  CULTURE, BLOOD (ROUTINE X 2)     Status: None   Collection Time    06/19/13  4:00 PM      Result Value Ref Range Status   Specimen Description BLOOD RIGHT ARM   Final   Special Requests BOTTLES DRAWN AEROBIC AND ANAEROBIC 5 CC EA   Final   Culture  Setup Time     Final   Value: 06/19/2013 19:18     Performed at [facility]   Culture     Final   Value:        BLOOD CULTURE RECEIVED NO GROWTH TO DATE CULTURE WILL BE HELD FOR 5 DAYS BEFORE ISSUING  A FINAL NEGATIVE REPORT      Performed at [facility]   Report Status PENDING   Incomplete  MRSA PCR SCREENING     Status: Abnormal   Collection Time    06/19/13  9:30 PM      Result Value Ref Range Status   MRSA by PCR POSITIVE (*) NEGATIVE Final   Comment:            The GeneXpert MRSA Assay (FDA     approved for NASAL specimens     only), is one component of a     comprehensive MRSA colonization     surveillance program. It is not     intended to diagnose MRSA     infection nor to guide or     monitor treatment for     MRSA infections.     RESULT CALLED TO, READ BACK BY AND VERIFIED WITH:     SPOKE WITH ADDISON,C RN [phone] COVINGTON,N   URINE CULTURE     Status: None   Collection Time    06/20/13 12:31 PM      Result Value Ref Range Status   Specimen Description URINE, CLEAN CATCH   Final   Special Requests NONE   Final   Culture  Setup Time     Final   Value: 06/20/2013 16:42     Performed at [facility] Count     Final   Value: 2,000 COLONIES/ML     Performed at [facility]   Culture     Final   Value: INSIGNIFICANT GROWTH     Performed at [facility]   Report Status 06/21/2013 FINAL   Final     Studies: Dg Abd Portable 1v  06/20/2013   CLINICAL DATA:  Abdominal pain and distention.  nausea and vomiting  EXAM: PORTABLE ABDOMEN - 1 VIEW  COMPARISON:  04/14/2013  FINDINGS: No evidence of dilated bowel loops. Stool again seen in the rectosigmoid colon. Percutaneous gastrostomy tube seen overlying the stomach. Pelvic phleboliths noted. No definite radiopaque calculi visualized.  IMPRESSION: Nonobstructive bowel gas pattern.  No acute findings.   Electronically Signed   By: Myles Rosenthal M.D.   On: 06/20/2013 17:41    Scheduled Meds: . baclofen  10 mg Per Tube TID  . carbidopa-levodopa  1 tablet Per Tube TID  . cephALEXin  500 mg Per Tube 4 times per day  . Chlorhexidine Gluconate Cloth  6 each Topical Q0600  . enoxaparin (LOVENOX) injection  40 mg  Subcutaneous Q24H  . feeding supplement (PRO-STAT SUGAR FREE 64)  30 mL Per Tube TID WC  . ferrous sulfate  300 mg Per Tube Q breakfast  . fludrocortisone  0.1 mg Per Tube QPM  . folic acid  1 mg Per Tube q morning - 10a  . free water  200 mL Per Tube Q6H  . guaifenesin  600 mg Per Tube Q6H  . ipratropium-albuterol  3 mL Nebulization BID  . loratadine  10 mg Per Tube Daily  . mupirocin ointment  1 application Nasal BID  . olopatadine  1 drop Both Eyes q morning - 10a  . pantoprazole sodium  40 mg Per Tube Daily  . phenytoin  100 mg Per Tube BID  . polyvinyl alcohol  1 drop Both Eyes Daily  . potassium chloride  40 mEq Per Tube Daily  . predniSONE  20 mg Per Tube Q breakfast  . saccharomyces boulardii  250 mg Oral BID  . sodium chloride  3 mL Intravenous Q12H   Continuous Infusions: . feeding supplement (OSMOLITE 1.5 CAL) 1,000 mL (06/22/13 0200)    Principal Problem:   SIRS (systemic inflammatory response syndrome) Active Problems:   Adrenal insufficiency   Quadriplegia   Parkinson's disease   Seizures   Anemia   Depression   Fever   UTI (lower urinary tract infection)   Iron deficiency anemia, unspecified   Glucocorticoid deficiency   Leukocytosis   Bronchitis   Dysphagia   Aphasia    Time spent: 30 min    Angy Swearengin, [facility]  [facility] Pager [phone]. If 7PM-7AM, please contact night-coverage at [email or web address], password [facility] 06/22/2013, 11:01 AM  LOS: 3 days

## 2013-06-23 DIAGNOSIS — A498 Other bacterial infections of unspecified site: Secondary | ICD-10-CM

## 2013-06-23 DIAGNOSIS — N12 Tubulo-interstitial nephritis, not specified as acute or chronic: Secondary | ICD-10-CM | POA: Diagnosis not present

## 2013-06-23 DIAGNOSIS — E2749 Other adrenocortical insufficiency: Secondary | ICD-10-CM | POA: Diagnosis not present

## 2013-06-23 DIAGNOSIS — A419 Sepsis, unspecified organism: Secondary | ICD-10-CM | POA: Diagnosis not present

## 2013-06-23 DIAGNOSIS — R6520 Severe sepsis without septic shock: Secondary | ICD-10-CM

## 2013-06-23 LAB — BASIC METABOLIC PANEL WITH GFR
BUN: 13 mg/dL (ref 6–23)
CO2: 27 meq/L (ref 19–32)
Calcium: 8.3 mg/dL — ABNORMAL LOW (ref 8.4–10.5)
Chloride: 100 meq/L (ref 96–112)
Creatinine, Ser: 0.42 mg/dL — ABNORMAL LOW (ref 0.50–1.35)
GFR calc Af Amer: >90 mL/min
GFR calc non Af Amer: >90 mL/min
Glucose, Bld: 82 mg/dL (ref 70–99)
Potassium: 3.5 meq/L — ABNORMAL LOW (ref 3.7–5.3)
Sodium: 135 meq/L — ABNORMAL LOW (ref 137–147)

## 2013-06-23 LAB — CBC
HCT: 28.6 % — ABNORMAL LOW (ref 39.0–52.0)
Hemoglobin: 9.3 g/dL — ABNORMAL LOW (ref 13.0–17.0)
MCH: 24.2 pg — ABNORMAL LOW (ref 26.0–34.0)
MCHC: 32.5 g/dL (ref 30.0–36.0)
MCV: 74.3 fL — ABNORMAL LOW (ref 78.0–100.0)
Platelets: 197 10*3/uL (ref 150–400)
RBC: 3.85 MIL/uL — ABNORMAL LOW (ref 4.22–5.81)
RDW: 13.6 % (ref 11.5–15.5)
WBC: 9 10*3/uL (ref 4.0–10.5)

## 2013-06-23 LAB — GLUCOSE, CAPILLARY: Glucose-Capillary: 104 mg/dL — ABNORMAL HIGH (ref 70–99)

## 2013-06-23 MED ORDER — CEPHALEXIN 250 MG/5ML PO SUSR: 500.0000 mg | Freq: Four times a day (QID) | ORAL | Status: DC

## 2013-06-23 NOTE — Discharge Summary (Signed)
Physician Discharge Summary  [name] ZOX:[id number] DOB: 1955/08/25 DOA: 06/19/2013  PCP: [name], MD  Admit date: 06/19/2013 Discharge date: 06/23/2013  Recommendations for Outpatient Follow-up:  1. Followup with primary care doctor in one to 2 weeks.  Please follow up pending blood cultures. 2. Followup with gastroenterology for evaluation of occult positive stool. Question whether he may have some mild gastritis or peptic ulcer disease which is also causing him to have intermittent vomiting. He was continued on PPI. 3. Start keflex on 3/30 and continue through April 8th (patient received ceftriaxone on 3/29).    Discharge Diagnoses:  Principal Problem:   Severe sepsis Active Problems:   Nausea & vomiting   Adrenal insufficiency   Quadriplegia   Parkinson's disease   Seizures   Anemia   Depression   Fever   UTI (lower urinary tract infection)   Iron deficiency anemia, unspecified   Glucocorticoid deficiency   Leukocytosis   Bronchitis   Dysphagia   Aphasia   E. coli pyelonephritis   Discharge Condition: Stable, improved  Diet recommendation: Resume n.p.o. and tube feeds as before  Wt Readings from Last 3 Encounters:  06/23/13 72.349 kg (159 lb 8 oz)  05/27/13 71.94 kg (158 lb 9.6 oz)  06/15/12 68.1 kg (150 lb 2.1 oz)    History of present illness:  [name] is a 58 y.o. male  With history of quadriplegia, dysphagia status post peg tube placement, aphasia, depression, seizure disorder, adrenal insufficiency, Parkinson's disease( which mother states is the first time she is hearing of it), nursing home resident who presents to the ED with a one-day history of fever with a temp of 102.6 at the facility and change in his breathing with a productive cough per mother. Mother also endorses some nausea and an episode of emesis, some dysuria, weakness, and a clammy sensation. Her mother patient also noted to be congested. Mother denies any chest pain, no  shortness of breath, no abdominal pain, no other associated symptoms. Chest x-ray was done at the facility which was negative. Repeat chest x-ray done in the ED was negative. Comprehensive metabolic profile done at out phosphatase of 174 AST of 70 protein of 9.5 total bilirubin of 0.2 otherwise was within normal limits. CBC done at a white count of 11 hemoglobin of 12.8 otherwise was within normal limits. Urinalysis done was nitrite negative moderate leukocytes large hemoglobin 21-50 WBCs 11-20 RBCs many bacteria.  Urine cultures were obtained. Blood cultures also obtained. Patient was given a dose of IV vancomycin, aztreonam, and Levaquin.  We were called to admit the patient for further evaluation and management.   Hospital Course:   Sepsis secondary to MDR E. Coli pyelonephritis. Patient noted to have a temperature 103.8 in the emergency room with a leukocytosis and heart rates in the 108.  He was started on vancomycin, levofloxacin, and aztreonam. Chest x-ray was negative for pneumonia. Legionella and strep pneumo antigens were negative. Urinalysis was concerning for a urinary tract infection, and urine culture grew multidrug resistant Escherichia coli. He had presumptive pyelonephritis. Antibiotics were narrowed to cephalosporin after discussion with Doctor Ninetta Lights from infectious disease.  He should continue a 14 day course of antibiotics, last day April 8.  Nausea, vomiting, abdominal distension. The symptoms may have been secondary to pyelonephritis. Liver function tests and lipase remained stable. A KUB demonstrated constipation and he was given a bisacodyl suppository. He had a bowel movement, however he continued to have vomiting and abdominal distention. His tube feeds were  held, and he underwent CAT scan of the abdomen and pelvis, which demonstrated no acute abnormality. His feeding tube was in good position. His tube feeds were resumed as continuous tube feeds and he was brought up to 55 mL  per hour, which he tolerated well. He may continue to have some intermittent nausea and vomiting secondary to his infection, but I anticipate that this will continue to get better over time. If he continues to have intermittent vomiting, recommend that he follow up with gastroenterology for possible upper endoscopy.      Adrenal insufficiency.  He continued Florinef, and his prednisone dose was increased for 3 days, then reduce to his previous maintenance dose.  Seizure disorder Corrected phenytoin level is 21.97. Continued home dose of phenytoin.   Leukocytosis do to sepsis and pyelonephritis, resolved.  Quadriplegia/aphasia/dysphagia.  Feeding tube appears to be adequately positioned on CAT scan. He continued baclofen.  Iron deficiency anemia, hgb drifting stable near baseline 9.4mg /dl. Occult stool +.  Recommend gastroenterology followup and ongoing use of PPI. He should continue iron supplementation and folic acid.  Parkinson's disease stable. Continue Sinemet.   Hypokalemia, due to GI losses, repleted with enteric potassium  Consultants:  None Procedures:  CXR  KUB Antibiotics:  vanc 3/26 >> 3/28  Aztreonam 3/26 >> 3/28  Levofloxacin 3/26 >> 3/28  Keflex 3/28    Discharge Exam: Filed Vitals:   06/23/13 0503  BP: 105/74  Pulse: 90  Temp: 98.4 F (36.9 C)  Resp: 16   Filed Vitals:   06/22/13 2032 06/22/13 2240 06/23/13 0503 06/23/13 0826  BP:  110/70 105/74   Pulse:  75 90   Temp:  98.6 F (37 C) 98.4 F (36.9 C)   TempSrc:  Oral Oral   Resp:  14 16   Height:      Weight:   72.349 kg (159 lb 8 oz)   SpO2: 98% 99% 98% 93%    General: Bed-bound adult male, No acute distres, smiling and laughing today HEENT: NCAT, MMM  Cardiovascular: RRR, nl S1, S2 no mrg, 2+ pulses, warm extremities  Respiratory: Diminished posteriorly at bases, no focal rales or wheeze, course rhonchi throughout no increased WOB  Abdomen: NABS, soft, moderately distended, nontender, PEG site  c/d/i  MSK: Increased tone and decreased bulk with contractures of bilateral wrists, no LEE  Neuro: Able to vocalize yes and no with some some difficulty and is able to follow some commands.    Discharge Instructions      Discharge Orders   Future Orders Complete By Expires   Call MD for:  difficulty breathing, headache or visual disturbances  As directed    Call MD for:  extreme fatigue  As directed    Call MD for:  hives  As directed    Call MD for:  persistant dizziness or light-headedness  As directed    Call MD for:  persistant nausea and vomiting  As directed    Call MD for:  severe uncontrolled pain  As directed    Call MD for:  temperature >100.4  As directed    Discharge instructions  As directed    Comments:     Please return to the hospital if he has worsening nausea, vomiting, inability to tolerate tube feeds, or recurrent fevers.  Out of bed as much as possible to reduce atelectasis   Increase activity slowly  As directed        Medication List    STOP taking these medications  moxifloxacin 400 MG tablet  Commonly known as:  AVELOX      TAKE these medications       acetaminophen 650 MG suppository  Commonly known as:  TYLENOL  Place 650 mg rectally every 4 (four) hours as needed for mild pain.     baclofen 10 MG tablet  Commonly known as:  LIORESAL  Give 10 mg by tube 3 (three) times daily.     carbidopa-levodopa 25-100 MG per tablet  Commonly known as:  SINEMET IR  1 tablet by PEG Tube route 3 (three) times daily.     cephALEXin 250 MG/5ML suspension  Commonly known as:  KEFLEX  Place 10 mLs (500 mg total) into feeding tube 4 (four) times daily.     cetirizine 5 MG tablet  Commonly known as:  ZYRTEC  5 mg by PEG Tube route every morning.     docusate 50 MG/5ML liquid  Commonly known as:  COLACE  Place 100 mg into feeding tube every 12 (twelve) hours as needed for mild constipation.     esomeprazole 20 MG capsule  Commonly known as:   NEXIUM  Give 20 mg by tube daily before breakfast. Give 1 capsule via tube (do not crush). Mix in 50mL H2O, shake well, give per tube immediately and flush tube afterward.     ferrous sulfate 220 (44 FE) MG/5ML solution  330 mg by PEG Tube route every morning.     fludrocortisone 0.1 MG tablet  Commonly known as:  FLORINEF  0.1 mg by PEG Tube route every evening. Adrenocortical Insufficiency     folic acid 1 MG tablet  Commonly known as:  FOLVITE  1 mg by PEG Tube route every morning.     guaiFENesin 100 MG/5ML Soln  Commonly known as:  ROBITUSSIN  Place 10 mLs into feeding tube every 4 (four) hours as needed for cough or to loosen phlegm.     ipratropium-albuterol 0.5-2.5 (3) MG/3ML Soln  Commonly known as:  DUONEB  Take 3 mLs by nebulization every 4 (four) hours.     olopatadine 0.1 % ophthalmic solution  Commonly known as:  PATANOL  Place 1 drop into both eyes every morning.     phenytoin 125 MG/5ML suspension  Commonly known as:  DILANTIN  Place 100 mg into feeding tube 2 (two) times daily.     polyethylene glycol packet  Commonly known as:  MIRALAX / GLYCOLAX  Take 17 g by mouth 2 (two) times daily as needed for mild constipation.     polyvinyl alcohol 1.4 % ophthalmic solution  Commonly known as:  LIQUIFILM TEARS  Place 1 drop into both eyes daily.     predniSONE 10 MG tablet  Commonly known as:  DELTASONE  Give 10 mg by tube every morning.     saccharomyces boulardii 250 MG capsule  Commonly known as:  FLORASTOR  Take 250 mg by mouth 2 (two) times daily. For 10 days     TWOCAL HN Liqd  Give 1 Can by tube continuous. Run@ 55cc/hr for 20 hrs. Turn off pump 1 hour before and keep off until 1 hour after phenytoin dose       Follow-up Information   Follow up with [name], MD. Schedule an appointment as soon as possible for a visit in 2 weeks.   Specialty:  Internal Medicine   Contact information:   [address] [place]  [address] [phone]       Follow up with  Gastroenterology. Call in 1 month. (assist with transportation as needed)       The results of significant diagnostics from this hospitalization (including imaging, microbiology, ancillary and laboratory) are listed below for reference.    Significant Diagnostic Studies: Ct Abdomen Pelvis W Contrast  06/22/2013   CLINICAL DATA:  Persistent abdominal pain and vomiting. Clinical concern for small bowel obstruction.  EXAM: CT ABDOMEN AND PELVIS WITH CONTRAST  TECHNIQUE: Multidetector CT imaging of the abdomen and pelvis was performed using the standard protocol following bolus administration of intravenous contrast.  CONTRAST:  50mL OMNIPAQUE IOHEXOL 300 MG/ML SOLN, OMNIPAQUE IOHEXOL 300 MG/ML SOLN  COMPARISON:  Portable abdomen radiograph dated 06/20/2013. Previous abdomen and pelvis CT dated 06/15/2012.  FINDINGS: Dense right lower lobe atelectasis and Mild left lower lobe atelectasis. Partially loculated small to moderate-sized right pleural effusion. Minimal left pleural effusion.  Stable mild diffuse low density of the liver relative to the spleen. Multiple left renal cysts without significant change. Unremarkable right kidney, adrenal glands, gallbladder, pancreas, spleen and urinary bladder. The previously seen bladder calculi are no longer present. Mildly enlarged prostate.  No gastrointestinal abnormalities or enlarged lymph nodes. Normal appearing appendix. Percutaneous gastrostomy tube in satisfactory position. Stable old T11 and T12 vertebral compression deformities with mild bony retropulsion at the upper T12 level.  IMPRESSION: 1. No acute abdominal or pelvic abnormality and no obstruction. 2. Stable mild hepatic steatosis. 3. Dense right lower lobe atelectasis and mild left lower lobe atelectasis. 4. Small to moderate sized partially loculated right pleural effusion and minimal left pleural effusion.   Electronically Signed   By: Gordan Payment M.D.    On: 06/22/2013 17:00   Dg Chest Port 1 View  06/20/2013   CLINICAL DATA:  Cough and fever  EXAM: PORTABLE CHEST - 1 VIEW  COMPARISON:  DG CHEST 1V PORT dated 06/19/2013  FINDINGS: Normal heart size. Bibasilar atelectasis increased. Lungs clear. No pneumothorax or pleural effusion. Pulmonary vascularity within normal limits  IMPRESSION: Increased bibasilar atelectasis.   Electronically Signed   By: Maryclare Bean M.D.   On: 06/20/2013 07:47   Dg Chest Portable 1 View  06/19/2013   CLINICAL DATA:  Fever. History of quadriplegia and Parkinson's disease.  EXAM: PORTABLE CHEST - 1 VIEW  COMPARISON:  DG ABD ACUTE W/CHEST dated 04/14/2013; DG CHEST 1V PORT dated 06/14/2012  FINDINGS: The heart size and mediastinal contours are stable. There is stable chronic elevation of the right hemidiaphragm with mild chronic right basilar atelectasis. Overall pulmonary aeration has improved. There is no edema or confluent airspace opacity.  IMPRESSION: Stable chronic right basilar atelectasis. No acute cardiopulmonary process.   Electronically Signed   By: Roxy Horseman M.D.   On: 06/19/2013 15:38   Dg Abd Portable 1v  06/20/2013   CLINICAL DATA:  Abdominal pain and distention.  nausea and vomiting  EXAM: PORTABLE ABDOMEN - 1 VIEW  COMPARISON:  04/14/2013  FINDINGS: No evidence of dilated bowel loops. Stool again seen in the rectosigmoid colon. Percutaneous gastrostomy tube seen overlying the stomach. Pelvic phleboliths noted. No definite radiopaque calculi visualized.  IMPRESSION: Nonobstructive bowel gas pattern.  No acute findings.   Electronically Signed   By: Myles Rosenthal M.D.   On: 06/20/2013 17:41    Microbiology: Recent Results (from the past 240 hour(s))  URINE CULTURE     Status: None   Collection Time    06/19/13  3:45 PM      Result Value Ref Range Status  Specimen Description URINE, CATHETERIZED   Final   Special Requests Normal   Final   Culture  Setup Time     Final   Value: 06/20/2013 06:07     Performed  at [facility]   Colony Count     Final   Value: >=100,000 COLONIES/ML     Performed at [facility]   Culture     Final   Value: ESCHERICHIA COLI     Performed at [facility]   Report Status 06/22/2013 FINAL   Final   Organism ID, Bacteria ESCHERICHIA COLI   Final  CULTURE, BLOOD (ROUTINE X 2)     Status: None   Collection Time    06/19/13  3:55 PM      Result Value Ref Range Status   Specimen Description BLOOD RIGHT HAND   Final   Special Requests BOTTLES DRAWN AEROBIC AND ANAEROBIC 5 CC EA   Final   Culture  Setup Time     Final   Value: 06/19/2013 19:18     Performed at [facility]   Culture     Final   Value:        BLOOD CULTURE RECEIVED NO GROWTH TO DATE CULTURE WILL BE HELD FOR 5 DAYS BEFORE ISSUING A FINAL NEGATIVE REPORT     Performed at [facility]   Report Status PENDING   Incomplete  CULTURE, BLOOD (ROUTINE X 2)     Status: None   Collection Time    06/19/13  4:00 PM      Result Value Ref Range Status   Specimen Description BLOOD RIGHT ARM   Final   Special Requests BOTTLES DRAWN AEROBIC AND ANAEROBIC 5 CC EA   Final   Culture  Setup Time     Final   Value: 06/19/2013 19:18     Performed at [facility]   Culture     Final   Value:        BLOOD CULTURE RECEIVED NO GROWTH TO DATE CULTURE WILL BE HELD FOR 5 DAYS BEFORE ISSUING A FINAL NEGATIVE REPORT     Performed at [facility]   Report Status PENDING   Incomplete  MRSA PCR SCREENING     Status: Abnormal   Collection Time    06/19/13  9:30 PM      Result Value Ref Range Status   MRSA by PCR POSITIVE (*) NEGATIVE Final   Comment:            The GeneXpert MRSA Assay (FDA     approved for NASAL specimens     only), is one component of a     comprehensive MRSA colonization     surveillance program. It is not     intended to diagnose MRSA     infection nor to guide or     monitor treatment for     MRSA infections.     RESULT CALLED TO, READ BACK BY  AND VERIFIED WITH:     SPOKE WITH ADDISON,C RN [phone] [place],N   URINE CULTURE     Status: None   Collection Time    06/20/13 12:31 PM      Result Value Ref Range Status   Specimen Description URINE, CLEAN CATCH   Final   Special Requests NONE   Final   Culture  Setup Time     Final   Value: 06/20/2013 16:42     Performed at [facility]   Colony Count     Final   Value: 2,000 COLONIES/ML     Performed at [facility]   Culture     Final   Value: INSIGNIFICANT GROWTH     Performed at [facility]   Report Status 06/21/2013 FINAL   Final     Labs: Basic Metabolic Panel:  Recent Labs Lab 06/19/13 1555 06/20/13 0400 06/21/13 0910 06/22/13 0643 06/23/13 0302  NA 137 135* 134* 138 135*  K 3.7 3.6* 3.2* 3.3* 3.5*  CL 97 100 98 102 100  CO2 25 25 24 27 27   GLUCOSE 92 102* 144* 115* 82  BUN 22 17 11 13 13   CREATININE 0.66 0.64 0.44* 0.39* 0.42*  CALCIUM 9.6 8.2* 8.1* 8.5 8.3*  MG  --  2.0  --   --   --    Liver Function Tests:  Recent Labs Lab 06/19/13 1555 06/20/13 0400 06/21/13 0910  AST 70* 68* 55*  ALT 34 5 24  ALKPHOS 174* 119* 104  BILITOT 0.2* 0.3 <0.2*  PROT 9.5* 7.3 6.7  ALBUMIN 3.8 2.8* 2.6*    Recent Labs Lab 06/21/13 0910  LIPASE 35   No results found for this basename: AMMONIA,  in the last 168 hours CBC:  Recent Labs Lab 06/19/13 1555 06/20/13 0400 06/21/13 0858 06/22/13 0643 06/23/13 0302  WBC 11.0* 9.6 10.5 8.7 9.0  NEUTROABS 7.6  --   --   --   --   HGB 12.8* 10.2* 9.4* 9.4* 9.3*  HCT 38.9* 31.7* 29.0* 29.8* 28.6*  MCV 74.1* 74.8* 74.2* 75.4* 74.3*  PLT 243 186 181 187 197   Cardiac Enzymes: No results found for this basename: CKTOTAL, CKMB, CKMBINDEX, TROPONINI,  in the last 168 hours BNP: BNP (last 3 results) No results found for this basename: PROBNP,  in the last 8760 hours CBG:  Recent Labs Lab 06/20/13 0745 06/21/13 0811 06/22/13 0736 06/23/13 0745  GLUCAP 118* 119* 111* 104*     Time coordinating discharge: 45 minutes  Signed:  Anandi Abramo  [facility] 06/23/2013, 12:51 PM

## 2013-06-23 NOTE — Progress Notes (Signed)
Persistent vomiting with tube feeds. Nonbilious nonbloody. Spoke with Doctor [facility] from gastroenterology about possible etiology of the vomiting. He reviewed the CAT scan that was done yesterday, and stated he thought that perhaps we should tighten the bumper on his PEG tube. Bumper was tightened so that it is now at approximately 3-1/2 at the level of the skin. He should be sitting upright with tube feeds. If he continues to have vomiting with his tube feeds, we will try some Reglan tomorrow. Will hold off on discharging to skilled nursing today to see if bumper tightening will help with the vomiting, and if it appears that he is improving, he can go back to skilled nursing tomorrow. Social worker aware.

## 2013-06-23 NOTE — Progress Notes (Addendum)
Per MD, Pt ready for d/c.  Notified facility.  DeDe, RN, stated that they can accept Pt today.  Faxed d/c summary.  Notified by MD later that Pt vomiting and not ready today.  Notified DeDe, RN at [facility].  Weekday CSW to follow on Monday.  Bernita Raisin, [place] [employer] [phone]

## 2013-06-24 DIAGNOSIS — R509 Fever, unspecified: Secondary | ICD-10-CM | POA: Diagnosis not present

## 2013-06-24 DIAGNOSIS — A4159 Other Gram-negative sepsis: Secondary | ICD-10-CM | POA: Diagnosis not present

## 2013-06-24 DIAGNOSIS — R112 Nausea with vomiting, unspecified: Secondary | ICD-10-CM | POA: Diagnosis not present

## 2013-06-24 DIAGNOSIS — G8220 Paraplegia, unspecified: Secondary | ICD-10-CM | POA: Diagnosis not present

## 2013-06-24 LAB — BASIC METABOLIC PANEL
BUN: 15 mg/dL (ref 6–23)
CO2: 27 mEq/L (ref 19–32)
CREATININE: 0.45 mg/dL — AB (ref 0.50–1.35)
Calcium: 8.8 mg/dL (ref 8.4–10.5)
Chloride: 99 mEq/L (ref 96–112)
Glucose, Bld: 85 mg/dL (ref 70–99)
POTASSIUM: 3.3 meq/L — AB (ref 3.7–5.3)
Sodium: 137 mEq/L (ref 137–147)

## 2013-06-24 LAB — CBC
HCT: 29.5 % — ABNORMAL LOW (ref 39.0–52.0)
Hemoglobin: 9.3 g/dL — ABNORMAL LOW (ref 13.0–17.0)
MCH: 23.4 pg — ABNORMAL LOW (ref 26.0–34.0)
MCHC: 31.5 g/dL (ref 30.0–36.0)
MCV: 74.3 fL — ABNORMAL LOW (ref 78.0–100.0)
PLATELETS: 223 10*3/uL (ref 150–400)
RBC: 3.97 MIL/uL — ABNORMAL LOW (ref 4.22–5.81)
RDW: 13.4 % (ref 11.5–15.5)
WBC: 7.8 10*3/uL (ref 4.0–10.5)

## 2013-06-24 LAB — GLUCOSE, CAPILLARY: GLUCOSE-CAPILLARY: 98 mg/dL (ref 70–99)

## 2013-06-24 NOTE — Progress Notes (Signed)
Discahrged to [facility], report given to [facility]. Pick up by [facility]. PICC line removed by IV RN, no s/s of swelling or bleeding noted upon discharged.

## 2013-06-24 NOTE — Progress Notes (Signed)
Patient did well overnight and feels well this morning.  Exam unchanged and vital signs stable.   Suspect that emesis is secondary to mild gastroparesis related to recent urinary tract infection and medication side effect. Patient is stable to discharge back to skilled nursing facility today.

## 2013-06-24 NOTE — Progress Notes (Addendum)
Patient cleared for discharge. Packet copied and placed in [place]. Voicemail left for patient's mother informing her of discharge. [facility]tar called for transportation.  Rodger Giangregorio C. [place] MSW, [place]

## 2013-06-25 LAB — CULTURE, BLOOD (ROUTINE X 2)
Culture: NO GROWTH
Culture: NO GROWTH

## 2013-07-02 ENCOUNTER — Non-Acute Institutional Stay (SKILLED_NURSING_FACILITY): Payer: Medicare Other | Admitting: Internal Medicine

## 2013-07-02 DIAGNOSIS — G20 Parkinson's disease: Secondary | ICD-10-CM | POA: Diagnosis not present

## 2013-07-02 DIAGNOSIS — B9620 Unspecified Escherichia coli [E. coli] as the cause of diseases classified elsewhere: Secondary | ICD-10-CM

## 2013-07-02 DIAGNOSIS — R569 Unspecified convulsions: Secondary | ICD-10-CM

## 2013-07-02 DIAGNOSIS — G20A1 Parkinson's disease without dyskinesia, without mention of fluctuations: Secondary | ICD-10-CM

## 2013-07-02 DIAGNOSIS — N12 Tubulo-interstitial nephritis, not specified as acute or chronic: Secondary | ICD-10-CM | POA: Diagnosis not present

## 2013-07-02 DIAGNOSIS — G8250 Quadriplegia, unspecified: Secondary | ICD-10-CM | POA: Diagnosis not present

## 2013-07-02 DIAGNOSIS — E2749 Other adrenocortical insufficiency: Secondary | ICD-10-CM

## 2013-07-02 DIAGNOSIS — R195 Other fecal abnormalities: Secondary | ICD-10-CM

## 2013-07-02 DIAGNOSIS — D649 Anemia, unspecified: Secondary | ICD-10-CM

## 2013-07-02 DIAGNOSIS — E2740 Unspecified adrenocortical insufficiency: Secondary | ICD-10-CM

## 2013-07-02 DIAGNOSIS — A498 Other bacterial infections of unspecified site: Secondary | ICD-10-CM

## 2013-07-02 NOTE — Progress Notes (Signed)
Patient ID: [name], male   DOB: Feb 20, 1956, 58 y.o.   MRN: [id number]  Provider:  Gwenith Spitz. Renato Gails, D.O., C.M.D.  Location:  [facility]  PCP: [name], MD  Code Status: full code  Allergies  Allergen Reactions  . Aspirin     unk reaction  . Nsaids     unk reaction  . Penicillins     unk reaction    Chief Complaint  Patient presents with  . Hospitalization Follow-up    return from hospital s/p admission for fever of unknown origin    HPI: 58 y.o. male long term care resident of [facility] with h/o quadriplegia, parkinson's disease, adrenal insufficiency, aphasia, dysphagia s/p PEG on tube feedings, seizures and anemia was readmitted here for continued long term care s/p hospitalization with fever of unknown origin of 102.6.  Workup showed multidrug resistant E coli pyelonephritis that also caused nausea and abdominal distention.  He is to complete keflex on 07/03/13.  His hemoccult was positive and requires GI f/u.  We were asked to f/u his blood cultures here which were negative.    He also required additional steroids due to his adrenal insufficiency.  Discussion with the DNS reveals that staff here felt he may have been having difficulty with his tube feedings that caused some of his GI symptoms before he went to the hospital.  When seen, he was resting in bed.  He denied any pain or shortness of breath.    ROS: Review of Systems  Constitutional: Negative for fever.  HENT: Negative for congestion.   Respiratory: Negative for shortness of breath.   Cardiovascular: Negative for chest pain.  Gastrointestinal: Negative for abdominal pain and constipation.       PEG tube  Genitourinary:       Catheter in place with yellow urine  Musculoskeletal: Negative for falls, joint pain and myalgias.  Neurological: Positive for tremors.  Endo/Heme/Allergies: Bruises/bleeds easily.  Psychiatric/Behavioral: Positive for depression.     Past Medical  History  Diagnosis Date  . Quadriplegia   . Depression   . Anemia   . Parkinson's disease   . Seizures   . Adrenal insufficiency   . Dysphagia 06/19/2013  . Aphasia 06/19/2013   Past Surgical History  Procedure Laterality Date  . Peg tube placement    . Peripherally inserted central catheter insertion     Social History:   reports that he has never smoked. He has never used smokeless tobacco. He reports that he does not drink alcohol or use illicit drugs.  Family History  Problem Relation Age of Onset  . Hypothyroidism Mother   . Hypertension Mother   . Diabetes Father     Medications: Patient's Medications  New Prescriptions   No medications on file  Previous Medications   ACETAMINOPHEN (TYLENOL) 650 MG SUPPOSITORY    Place 650 mg rectally every 4 (four) hours as needed for mild pain.   BACLOFEN (LIORESAL) 10 MG TABLET    Give 10 mg by tube 3 (three) times daily.   CARBIDOPA-LEVODOPA (SINEMET) 25-100 MG PER TABLET    1 tablet by PEG Tube route 3 (three) times daily.    CEPHALEXIN (KEFLEX) 250 MG/5ML SUSPENSION    Place 10 mLs (500 mg total) into feeding tube 4 (four) times daily.   CETIRIZINE (ZYRTEC) 5 MG TABLET    5 mg by PEG Tube route every morning.    DOCUSATE (COLACE) 50 MG/5ML LIQUID    Place 100  mg into feeding tube every 12 (twelve) hours as needed for mild constipation.   ESOMEPRAZOLE (NEXIUM) 20 MG CAPSULE    Give 20 mg by tube daily before breakfast. Give 1 capsule via tube (do not crush). Mix in 50mL H2O, shake well, give per tube immediately and flush tube afterward.   FERROUS SULFATE 220 (44 FE) MG/5ML SOLUTION    330 mg by PEG Tube route every morning.    FLUDROCORTISONE (FLORINEF) 0.1 MG TABLET    0.1 mg by PEG Tube route every evening. Adrenocortical Insufficiency   FOLIC ACID (FOLVITE) 1 MG TABLET    1 mg by PEG Tube route every morning.    GUAIFENESIN (ROBITUSSIN) 100 MG/5ML SOLN    Place 10 mLs into feeding tube every 4 (four) hours as needed for cough or  to loosen phlegm.   IPRATROPIUM-ALBUTEROL (DUONEB) 0.5-2.5 (3) MG/3ML SOLN    Take 3 mLs by nebulization every 4 (four) hours.   NUTRITIONAL SUPPLEMENTS (TWOCAL HN) LIQD    Give 1 Can by tube continuous. Run@ 55cc/hr for 20 hrs. Turn off pump 1 hour before and keep off until 1 hour after phenytoin dose   OLOPATADINE (PATANOL) 0.1 % OPHTHALMIC SOLUTION    Place 1 drop into both eyes every morning.    PHENYTOIN (DILANTIN) 125 MG/5ML SUSPENSION    Place 100 mg into feeding tube 2 (two) times daily.    POLYETHYLENE GLYCOL (MIRALAX / GLYCOLAX) PACKET    Take 17 g by mouth 2 (two) times daily as needed for mild constipation.   POLYVINYL ALCOHOL (LIQUIFILM TEARS) 1.4 % OPHTHALMIC SOLUTION    Place 1 drop into both eyes daily.    PREDNISONE (DELTASONE) 10 MG TABLET    Give 10 mg by tube every morning.   SACCHAROMYCES BOULARDII (FLORASTOR) 250 MG CAPSULE    Take 250 mg by mouth 2 (two) times daily. For 10 days  Modified Medications   No medications on file  Discontinued Medications   No medications on file     Physical Exam: There were no vitals filed for this visit. Physical Exam  Constitutional:  Thin white male with long braids, resting comfortably in bed, head tilted to the right  HENT:  Head: Normocephalic and atraumatic.  Right Ear: External ear normal.  Left Ear: External ear normal.  Nose: Nose normal.  Eyes: Conjunctivae and EOM are normal. Pupils are equal, round, and reactive to light.  Neck: Neck supple. No JVD present. No thyromegaly present.  Cardiovascular: Normal rate, regular rhythm, normal heart sounds and intact distal pulses.   Pulmonary/Chest: Effort normal.  Coarse wet rhonchi throughout both lung fields, wet cough  Abdominal: Soft. Bowel sounds are normal. He exhibits no distension and no mass. There is no tenderness.  Genitourinary:  No suprapubic tenderness present;  Foley with yellow urine draining  Musculoskeletal: He exhibits no edema and no tenderness.    Quadriplegic;  Has atrophy of upper and lower extremity muscles;  Extremities flaccid  Lymphadenopathy:    He has no cervical adenopathy.  Neurological: He is alert.  Skin: Skin is warm and dry.  PEG site w/o erythema, warmth, drainage  Psychiatric: He has a normal mood and affect.     Labs reviewed: Basic Metabolic Panel:  Recent Labs  25/36/64 0400  06/22/13 0643 06/23/13 0302 06/24/13 0310  NA 135*  < > 138 135* 137  K 3.6*  < > 3.3* 3.5* 3.3*  CL 100  < > 102 100 99  CO2 25  < >  27 27 27   GLUCOSE 102*  < > 115* 82 85  BUN 17  < > 13 13 15   CREATININE 0.64  < > 0.39* 0.42* 0.45*  CALCIUM 8.2*  < > 8.5 8.3* 8.8  MG 2.0  --   --   --   --   < > = values in this interval not displayed. Liver Function Tests:  Recent Labs  06/19/13 1555 06/20/13 0400 06/21/13 0910  AST 70* 68* 55*  ALT 34 5 24  ALKPHOS 174* 119* 104  BILITOT 0.2* 0.3 <0.2*  PROT 9.5* 7.3 6.7  ALBUMIN 3.8 2.8* 2.6*    Recent Labs  06/21/13 0910  LIPASE 35  CBC:  Recent Labs  04/14/13 1845 06/19/13 1555  06/22/13 0643 06/23/13 0302 06/24/13 0310  WBC 6.7 11.0*  < > 8.7 9.0 7.8  NEUTROABS 2.7 7.6  --   --   --   --   HGB 10.6* 12.8*  < > 9.4* 9.3* 9.3*  HCT 32.5* 38.9*  < > 29.8* 28.6* 29.5*  MCV 73.9* 74.1*  < > 75.4* 74.3* 74.3*  PLT 252 243  < > 187 197 223  < > = values in this interval not displayed.   Recent Labs  06/22/13 0736 06/23/13 0745 06/24/13 0729  GLUCAP 111* 104* 98    Imaging and Procedures: Ct Abdomen Pelvis W Contrast  06/22/2013 CLINICAL DATA: Persistent abdominal pain and vomiting. Clinical concern for small bowel obstruction. EXAM: CT ABDOMEN AND PELVIS WITH CONTRAST TECHNIQUE: Multidetector CT imaging of the abdomen and pelvis was performed using the standard protocol following bolus administration of intravenous contrast. CONTRAST: 50mL OMNIPAQUE IOHEXOL 300 MG/ML SOLN, OMNIPAQUE IOHEXOL 300 MG/ML SOLN COMPARISON: Portable abdomen radiograph dated  06/20/2013. Previous abdomen and pelvis CT dated 06/15/2012. FINDINGS: Dense right lower lobe atelectasis and Mild left lower lobe atelectasis. Partially loculated small to moderate-sized right pleural effusion. Minimal left pleural effusion. Stable mild diffuse low density of the liver relative to the spleen. Multiple left renal cysts without significant change. Unremarkable right kidney, adrenal glands, gallbladder, pancreas, spleen and urinary bladder. The previously seen bladder calculi are no longer present. Mildly enlarged prostate. No gastrointestinal abnormalities or enlarged lymph nodes. Normal appearing appendix. Percutaneous gastrostomy tube in satisfactory position. Stable old T11 and T12 vertebral compression deformities with mild bony retropulsion at the upper T12 level. IMPRESSION: 1. No acute abdominal or pelvic abnormality and no obstruction. 2. Stable mild hepatic steatosis. 3. Dense right lower lobe atelectasis and mild left lower lobe atelectasis. 4. Small to moderate sized partially loculated right pleural effusion and minimal left pleural effusion. Electronically Signed By: Gordan Payment M.D. On: 06/22/2013 17:00   Dg Chest Port 1 View  06/20/2013 CLINICAL DATA: Cough and fever EXAM: PORTABLE CHEST - 1 VIEW COMPARISON: DG CHEST 1V PORT dated 06/19/2013 FINDINGS: Normal heart size. Bibasilar atelectasis increased. Lungs clear. No pneumothorax or pleural effusion. Pulmonary vascularity within normal limits IMPRESSION: Increased bibasilar atelectasis. Electronically Signed By: Maryclare Bean M.D. On: 06/20/2013 07:47   Dg Chest Portable 1 View  06/19/2013 CLINICAL DATA: Fever. History of quadriplegia and Parkinson's disease. EXAM: PORTABLE CHEST - 1 VIEW COMPARISON: DG ABD ACUTE W/CHEST dated 04/14/2013; DG CHEST 1V PORT dated 06/14/2012 FINDINGS: The heart size and mediastinal contours are stable. There is stable chronic elevation of the right hemidiaphragm with mild chronic right basilar atelectasis.  Overall pulmonary aeration has improved. There is no edema or confluent airspace opacity. IMPRESSION: Stable chronic right basilar atelectasis. No  acute cardiopulmonary process. Electronically Signed By: Roxy Horseman M.D. On: 06/19/2013 15:38   Dg Abd Portable 1v  06/20/2013 CLINICAL DATA: Abdominal pain and distention. nausea and vomiting EXAM: PORTABLE ABDOMEN - 1 VIEW COMPARISON: 04/14/2013 FINDINGS: No evidence of dilated bowel loops. Stool again seen in the rectosigmoid colon. Percutaneous gastrostomy tube seen overlying the stomach. Pelvic phleboliths noted. No definite radiopaque calculi visualized. IMPRESSION: Nonobstructive bowel gas pattern. No acute findings. Electronically Signed By: Myles Rosenthal M.D. On: 06/20/2013 17:41   Assessment/Plan 1. E. coli pyelonephritis -completing his keflex 07/03/13 -remains somnolent, but responsive--seems he is not quite back to baseline -bld cxs negative 3/25 -f/u cbc, bmp  2. Quadriplegia -here for long term care due to this--dependent in adls, on tube feedings  3. Parkinson's disease -continues on sinemet  4. Anemia -continues on iron supplement -f/u cbc   5. Adrenal insufficiency -requires stress dose steroids during hospitalization, continues on florinef  6. Seizures -cont on dilantin, f/u level at routine visit  7.  Heme positive FOBT -f/u with GI -cont iron  Functional status:  Dependent in all adls, receives tube feedings-- monitor for aspiration due to cough and rhonchi during visit  Family/ staff Communication: discussing with DNS  Labs/tests ordered: f/u GI, cbc, bmp

## 2013-07-07 ENCOUNTER — Encounter: Payer: Self-pay | Admitting: Internal Medicine

## 2013-07-07 DIAGNOSIS — R195 Other fecal abnormalities: Secondary | ICD-10-CM | POA: Insufficient documentation

## 2013-07-08 ENCOUNTER — Encounter: Payer: Self-pay | Admitting: Internal Medicine

## 2013-07-08 ENCOUNTER — Non-Acute Institutional Stay (SKILLED_NURSING_FACILITY): Payer: Medicare Other | Admitting: Internal Medicine

## 2013-07-08 DIAGNOSIS — R21 Rash and other nonspecific skin eruption: Secondary | ICD-10-CM

## 2013-07-08 DIAGNOSIS — B370 Candidal stomatitis: Secondary | ICD-10-CM | POA: Diagnosis not present

## 2013-07-08 NOTE — Progress Notes (Signed)
Patient ID: [name], male   DOB: 1955/06/10, 58 y.o.   MRN: [id number]   This is an acute visit.  Level of care skilled.  [place]  Chief complaint-acute visit secondary to back rash.  History of present illness.  Patient is a middle-aged male with a history of quadriplegia as well as seizure disorder who was recently hospitalized for a multi-drug-resistant Escherichia coli UTI.--He does have dysphasia and his status post PEG tube and chronic indwelling Foley catheter  He was discharged on a course of Keflex which he completed on April 8  Patient apparently has returned relatively to his baseline however nursing staff has noted a large rash on his back.  Patient is relatively aphasic and cannot give an extensive review of systems however apparently he is not complaining of itching or discomfort with this.  His vital signs appear to be stable.  Family medical social history as been reviewed per admission note on 07/02/2013.  Medications have been reviewed per [facility] I do note he is on prednisone with a history of adrenal issues.  Review of systems again limited he does not appear however to be in any distress from this rash apparently is relatively at his baseline.  Physical exam.  She is 97.2 pulse 80 respirations 20 blood pressure 111/67.  In general this is a somewhat frail middle-aged male in no distress lying comfortably in bed.  Skin is warm and dry I do noet he has an rash covering the upper two thirds of his back this appears to have some satellite lesions and actually does extend somewhat to the right axilla area  I do not note any drainage or bleeding.  I do not note a rash elsewhere on his thorax or arms  Oropharynx appears to have a slight white film on his tongue  Heart is regular rate and rhythm without murmur gallop or rub.  Chest is clear to auscultation with somewhat poor respiratory effort no labored breathing.  Abdomen soft does not appear  to be tender PEG site appears unremarkable bowel sounds are positive.     Neurologic continues with relative aphasia as well as quadriplegia at baseline  Labs.  06/24/2013.  WBC 7.8 hemoglobin 9.3 platelets 223.  Sodium 137 potassium 3.3 BUN 15 creatinine 0.45.  Assessment plan.  #1-dermatitis unspecified-will initially treat the rash with Nizoral cream twice a day -- Monitor for resolution if there is no resolution or there is any increase in size  notify provider  Also monitor vital signs pulse ox every shift for 72 hours to keep an eye on patient.  Secondary to size of rash will treat also with oral Diflucan 100 mg daily for 2 days.  Also will treat what appears to be possibly some oral thrush with nystatin solution swab tongue 4 times a day for 5 days and monitor  Also will order a CBC with differential and basic metabolic panel tomorrow.   [id number]  .

## 2013-07-09 DIAGNOSIS — I10 Essential (primary) hypertension: Secondary | ICD-10-CM | POA: Diagnosis not present

## 2013-07-09 DIAGNOSIS — D649 Anemia, unspecified: Secondary | ICD-10-CM | POA: Diagnosis not present

## 2013-07-23 ENCOUNTER — Non-Acute Institutional Stay (SKILLED_NURSING_FACILITY): Payer: Medicare Other | Admitting: Internal Medicine

## 2013-07-23 DIAGNOSIS — E2749 Other adrenocortical insufficiency: Secondary | ICD-10-CM | POA: Diagnosis not present

## 2013-07-23 DIAGNOSIS — G20 Parkinson's disease: Secondary | ICD-10-CM | POA: Diagnosis not present

## 2013-07-23 DIAGNOSIS — R569 Unspecified convulsions: Secondary | ICD-10-CM | POA: Diagnosis not present

## 2013-07-23 DIAGNOSIS — D509 Iron deficiency anemia, unspecified: Secondary | ICD-10-CM

## 2013-07-23 DIAGNOSIS — E2740 Unspecified adrenocortical insufficiency: Secondary | ICD-10-CM

## 2013-07-25 NOTE — Progress Notes (Signed)
         PROGRESS NOTE  DATE: 07-23-13  FACILITY: Nursing Home Location: [facility]  LEVEL OF CARE: SNF (31)  Routine Visit  CHIEF COMPLAINT:  Manage seizure disorder, adrenal insufficiency and iron deficiency anemia  HISTORY OF PRESENT ILLNESS:  REASSESSMENT OF ONGOING PROBLEM(S):  ANEMIA: The anemia has been stable. The staff deny fatigue, melena or hematochezia. No complications from the medications currently being used. Patient is a poor historian. Hemoglobin 10.7, MCV 74.1 in 4/14,  In  5-14 hemoglobin 11.9, MCV 74.9, in 11-14 hemoglobin 10.4, MCV 73.9, in 4-15 hemoglobin 10.6, MCV 75.4.  SEIZURE DISORDER: The patient's seizure disorder remains stable. No complications reported from the medications presently being used. Staff do not report any recent seizure activity.  ADRENAL INSUFFICIENCY: The patient's adrenal insufficiency remains table. The staff deny fatigue, nausea, anorexia, weight loss, abdominal pain, arthralgias, fever, salt craving, postural dizziness and decreased libido. No complications noted from the medications currently being used.  PAST MEDICAL HISTORY : Reviewed.  No changes.  CURRENT MEDICATIONS: Reviewed per [facility]  REVIEW OF SYSTEMS: Unobtainable due to patient being a poor historian  PHYSICAL EXAMINATION  VS:  The vital signs section  GENERAL: no acute distress, thin body habitus EYES: Normal sclerae, normal conjunctivae, no discharge NECK: supple, trachea midline, no neck masses, no thyroid tenderness, no thyromegaly LYMPHATICS: No cervical lymphadenopathy, no supraclavicular lymphadenopathy RESPIRATORY: breathing is even & unlabored, BS CTAB CARDIAC: RRR, no murmur,no extra heart sounds, no edema GI: abdomen soft, normal BS, no masses, no tenderness, no hepatomegaly, no splenomegaly, patient has a PEG PSYCHIATRIC: the patient is alert & oriented to person, affect & behavior appropriate  LABS/RADIOLOGY: 4-15 hemoglobin  10.6, MCV 75.4 otherwise CBC normal, glucose 116 otherwise BMP normal 3-15 Dilantin level 11.4, hemoglobin A1c 5.4 2-15 CMP normal 11-14 hemoglobin 10.4, MCV 73.9 otherwise CBC normal, serum iron 40, TIBC 227, Percent saturation 18, ferritin 229  9-14 glc 106 ow bmp nl  7-14 alkaline phosphatase 128 otherwise liver profile normal  6/14 fasting lipid panel normal, Dilantin level 15.6  5-14 BMP normal, hemoglobin 11.9, MCV 74.9 otherwise CBC normal  4/14 hemoglobin 10.7, MCV 74.1 otherwise CBC normal, glucose 116 otherwise BMP normal 12/13 Dilantin 16.2 11/13 alkaline phosphatase 171 otherwise liver profile normal  ASSESSMENT/PLAN:  seizure disorder-well-controlled. iron deficiency anemia-stable. adrenal insufficiency-stable. parkinsonism- stable. dysphagia-continue tube feeds. allergic rhinitis-well-controlled.  CPT CODE: 40768  [name] Y. Durwin Reges, [place] [phone]

## 2013-09-05 DIAGNOSIS — M79609 Pain in unspecified limb: Secondary | ICD-10-CM | POA: Diagnosis not present

## 2013-09-05 DIAGNOSIS — D649 Anemia, unspecified: Secondary | ICD-10-CM | POA: Diagnosis not present

## 2013-09-05 DIAGNOSIS — B351 Tinea unguium: Secondary | ICD-10-CM | POA: Diagnosis not present

## 2013-09-18 ENCOUNTER — Non-Acute Institutional Stay (SKILLED_NURSING_FACILITY): Payer: Medicare Other | Admitting: Internal Medicine

## 2013-09-18 ENCOUNTER — Encounter: Payer: Self-pay | Admitting: Internal Medicine

## 2013-09-18 DIAGNOSIS — D509 Iron deficiency anemia, unspecified: Secondary | ICD-10-CM

## 2013-09-18 DIAGNOSIS — G8250 Quadriplegia, unspecified: Secondary | ICD-10-CM

## 2013-09-18 DIAGNOSIS — Z87440 Personal history of urinary (tract) infections: Secondary | ICD-10-CM

## 2013-09-18 DIAGNOSIS — R569 Unspecified convulsions: Secondary | ICD-10-CM

## 2013-09-18 DIAGNOSIS — E2749 Other adrenocortical insufficiency: Secondary | ICD-10-CM | POA: Diagnosis not present

## 2013-09-18 DIAGNOSIS — E2740 Unspecified adrenocortical insufficiency: Secondary | ICD-10-CM

## 2013-09-18 NOTE — Progress Notes (Signed)
Patient ID: [name], male   DOB: 23-May-1955, 58 y.o.   MRN: [id number]   This is a routine visit.  Level of care skilled.  Facility AF.    Allergies   Allergen  Reactions   .  Aspirin      unk reaction   .  Nsaids      unk reaction   .  Penicillins      unk reaction    Chief Complaint   Medical management of chronic medical conditions including quadriplegia and seizure disorder adrenal insufficiency anemia   .        HPI: 58 y.o. male long term care resident of [facility] with h/o quadriplegia, parkinson's disease, adrenal insufficiency, aphasia, dysphagia s/p PEG on tube feedings, seizures and anemia was readmitted here for continued long term care s/p hospitalization  Earlier this year with fever of unknown origin  Workup showed multidrug resistant E coli pyelonephritis that also caused nausea and abdominal distention. This was treated with antibiotics. His hemoccult was positive and required GI f/u. We were asked to f/u his blood cultures here which were negative. He also required additional steroids due to his adrenal insufficiency.  Have reviewed his consult most appears he had a colonoscopy done in May of 2014 which showed a benign polyp will write an order to see where we stand with GI followup clinically he has been stable I am not aware of any episodes of rectal bleeding  Since his hospitalization he appears to have recovered well and is at his baseline-nursing has not really recorded any recent acute issues his vital signs are stable he has somewhat consistently low systolics in the 90s but is asymptomatic and this is not new  Abdomen no recent seizures to my knowledge he is on Dilantin.  Apparently he is tolerating his tube feedings well.  Family medical social history reviewed per discharge summary on 06/23/2013.  Medications have been reviewed per MAR. .           ROS:  Review of Systems--per nursing  Constitutional: Negative for fever.  HENT:  Negative for congestion.  Respiratory: Negative for shortness of breath.  Cardiovascular: Negative for chest pain.  Gastrointestinal: Negative for abdominal pain and constipation.  PEG tube  Genitourinary:  Catheter in place with yellow urine  Musculoskeletal: Negative for falls, joint pain and myalgias.  Neurological: Positive for tremors.  Endo/Heme/Allergies: Bruises/bleeds easily.  Psychiatric/Behavioral: Positive for depression.  Past Medical History   Diagnosis  Date   .  Quadriplegia    .  Depression    .  Anemia    .  Parkinson's disease    .  Seizures    .  Adrenal insufficiency    .  Dysphagia  06/19/2013   .  Aphasia  06/19/2013    Past Surgical History   Procedure  Laterality  Date   .  Peg tube placement     .  Peripherally inserted central catheter insertion     Social History:  reports that he has never smoked. He has never used smokeless tobacco. He reports that he does not drink alcohol or use illicit drugs.  Family History   Problem  Relation  Age of Onset   .  Hypothyroidism  Mother    .  Hypertension  Mother    .  Diabetes  Father    Medications:  Patient's Medications   New Prescriptions    No medications on file   Previous Medications  ACETAMINOPHEN (TYLENOL) 650 MG SUPPOSITORY  Place 650 mg rectally every 4 (four) hours as needed for mild pain.    BACLOFEN (LIORESAL) 10 MG TABLET  Give 10 mg by tube 3 (three) times daily.    CARBIDOPA-LEVODOPA (SINEMET) 25-100 MG PER TABLET  1 tablet by PEG Tube route 3 (three) times daily.         CETIRIZINE (ZYRTEC) 5 MG TABLET  5 mg by PEG Tube route every morning.    DOCUSATE (COLACE) 50 MG/5ML LIQUID  Place 100 mg into feeding tube every 12 (twelve) hours as needed for mild constipation.    ESOMEPRAZOLE (NEXIUM) 20 MG CAPSULE  Give 20 mg by tube daily before breakfast. Give 1 capsule via tube (do not crush). Mix in 50mL H2O, shake well, give per tube immediately and flush tube afterward.    FERROUS SULFATE 220  (44 FE) MG/5ML SOLUTION  330 mg by PEG Tube route every morning.    FLUDROCORTISONE (FLORINEF) 0.1 MG TABLET  0.1 mg by PEG Tube route every evening. Adrenocortical Insufficiency    FOLIC ACID (FOLVITE) 1 MG TABLET  1 mg by PEG Tube route every morning.    GUAIFENESIN (ROBITUSSIN) 100 MG/5ML SOLN  Place 10 mLs into feeding tube every 4 (four) hours as needed for cough or to loosen phlegm.    IPRATROPIUM-ALBUTEROL (DUONEB) 0.5-2.5 (3) MG/3ML SOLN  Take 3 mLs by nebulization every 4 (four) hours.    NUTRITIONAL SUPPLEMENTS (TWOCAL HN) LIQD  Give 1 Can by tube continuous. Run@ 55cc/hr for 20 hrs. Turn off pump 1 hour before and keep off until 1 hour after phenytoin dose    OLOPATADINE (PATANOL) 0.1 % OPHTHALMIC SOLUTION  Place 1 drop into both eyes every morning.    PHENYTOIN (DILANTIN) 125 MG/5ML SUSPENSION  Place 100 mg into feeding tube 2 (two) times daily.    POLYETHYLENE GLYCOL (MIRALAX / GLYCOLAX) PACKET  Take 17 g by mouth 2 (two) times daily as needed for mild constipation.    POLYVINYL ALCOHOL (LIQUIFILM TEARS) 1.4 % OPHTHALMIC SOLUTION  Place 1 drop into both eyes daily.    PREDNISONE (DELTASONE) 10 MG TABLET  Give 10 mg by tube every morning.    SACCHAROMYCES BOULARDII (FLORASTOR) 250 MG CAPSULE  Take 250 mg by mouth 2 (two) times daily. For 10 days              Physical Exam:  Temperature 97.0 pulse 72 respirations 17 blood pressure 98/61 blood pressures range 115/55-to systolics in the low 90s with diastolics in the 50s to 70s   Physical Exam  Constitutional:  Thin white male with long braids, resting comfortably in bed, head tilted to the right  HENT:  Head: Normocephalic and atraumatic.  Right Ear: External ear normal.  Left Ear: External ear normal.  Nose: Nose normal.  Eyes: Conjunctivae and EOM are normal. Pupils are equal, round, and reactive to light.  Neck: Neck supple. No JVD present. No thyromegaly present.  Cardiovascular: Normal rate, regular rhythm, normal heart  sounds .  Pulmonary/Chest: Effort normal.   no labored breathing clear to auscultation  Abdominal: Soft. Bowel sounds are normal. He exhibits no distension and no mass. There is no tenderness.  Genitourinary:  No suprapubic tenderness present; Foley with yellow urine draining  Musculoskeletal: He exhibits no edema and no tenderness.  Quadriplegic; Has atrophy of upper and lower extremity muscles; Extremities flaccid  .  Neurological: He is alert--with history of quadriplegia and upper extremity contractures which is baseline.  Skin: Skin is warm and dry.  PEG site w/o erythema, warmth, drainage  Psychiatric: He has a normal mood and affect.  Labs reviewed:  07/09/2013.  WBC 7.7 hemoglobin 10.6 platelets 331.  Sodium 137 potassium 3.8 BUN 21 creatinine 0.5.  05/31/2013.  Dilantin level 11.4    Basic Metabolic Panel:  Recent Labs   06/20/13 0400   06/22/13 0643  06/23/13 0302  06/24/13 0310   NA  135*  < >  138  135*  137   K  3.6*  < >  3.3*  3.5*  3.3*   CL  100  < >  102  100  99   CO2  25  < >  27  27  27    GLUCOSE  102*  < >  115*  82  85   BUN  17  < >  13  13  15    CREATININE  0.64  < >  0.39*  0.42*  0.45*   CALCIUM  8.2*  < >  8.5  8.3*  8.8   MG  2.0  --  --  --  --   < > = values in this interval not displayed.  Liver Function Tests:  Recent Labs   06/19/13 1555  06/20/13 0400  06/21/13 0910   AST  70*  68*  55*   ALT  34  5  24   ALKPHOS  174*  119*  104   BILITOT  0.2*  0.3  <0.2*   PROT  9.5*  7.3  6.7   ALBUMIN  3.8  2.8*  2.6*   Recent Labs   06/21/13 0910   LIPASE  35   CBC:  Recent Labs   04/14/13 1845  06/19/13 1555   06/22/13 0643  06/23/13 0302  06/24/13 0310   WBC  6.7  11.0*  < >  8.7  9.0  7.8   NEUTROABS  2.7  7.6  --  --  --  --   HGB  10.6*  12.8*  < >  9.4*  9.3*  9.3*   HCT  32.5*  38.9*  < >  29.8*  28.6*  29.5*   MCV  73.9*  74.1*  < >  75.4*  74.3*  74.3*   PLT  252  243  < >  187  197  223   < > = values in this  interval not displayed.  Recent Labs   06/22/13 0736  06/23/13 0745  06/24/13 0729   GLUCAP  111*  104*  98   Imaging and Procedures:  Ct Abdomen Pelvis W Contrast  06/22/2013 CLINICAL DATA: Persistent abdominal pain and vomiting. Clinical concern for small bowel obstruction. EXAM: CT ABDOMEN AND PELVIS WITH CONTRAST TECHNIQUE: Multidetector CT imaging of the abdomen and pelvis was performed using the standard protocol following bolus administration of intravenous contrast. CONTRAST: 50mL OMNIPAQUE IOHEXOL 300 MG/ML SOLN, OMNIPAQUE IOHEXOL 300 MG/ML SOLN COMPARISON: Portable abdomen radiograph dated 06/20/2013. Previous abdomen and pelvis CT dated 06/15/2012. FINDINGS: Dense right lower lobe atelectasis and Mild left lower lobe atelectasis. Partially loculated small to moderate-sized right pleural effusion. Minimal left pleural effusion. Stable mild diffuse low density of the liver relative to the spleen. Multiple left renal cysts without significant change. Unremarkable right kidney, adrenal glands, gallbladder, pancreas, spleen and urinary bladder. The previously seen bladder calculi are no longer present. Mildly enlarged prostate. No gastrointestinal abnormalities or enlarged lymph nodes. Normal appearing appendix.  Percutaneous gastrostomy tube in satisfactory position. Stable old T11 and T12 vertebral compression deformities with mild bony retropulsion at the upper T12 level. IMPRESSION: 1. No acute abdominal or pelvic abnormality and no obstruction. 2. Stable mild hepatic steatosis. 3. Dense right lower lobe atelectasis and mild left lower lobe atelectasis. 4. Small to moderate sized partially loculated right pleural effusion and minimal left pleural effusion. Electronically Signed By: Gordan Payment M.D. On: 06/22/2013 17:00  Dg Chest Port 1 View  06/20/2013 CLINICAL DATA: Cough and fever EXAM: PORTABLE CHEST - 1 VIEW COMPARISON: DG CHEST 1V PORT dated 06/19/2013 FINDINGS: Normal heart size.  Bibasilar atelectasis increased. Lungs clear. No pneumothorax or pleural effusion. Pulmonary vascularity within normal limits IMPRESSION: Increased bibasilar atelectasis. Electronically Signed By: Maryclare Bean M.D. On: 06/20/2013 07:47  Dg Chest Portable 1 View  06/19/2013 CLINICAL DATA: Fever. History of quadriplegia and Parkinson's disease. EXAM: PORTABLE CHEST - 1 VIEW COMPARISON: DG ABD ACUTE W/CHEST dated 04/14/2013; DG CHEST 1V PORT dated 06/14/2012 FINDINGS: The heart size and mediastinal contours are stable. There is stable chronic elevation of the right hemidiaphragm with mild chronic right basilar atelectasis. Overall pulmonary aeration has improved. There is no edema or confluent airspace opacity. IMPRESSION: Stable chronic right basilar atelectasis. No acute cardiopulmonary process. Electronically Signed By: Roxy Horseman M.D. On: 06/19/2013 15:38  Dg Abd Portable 1v  06/20/2013 CLINICAL DATA: Abdominal pain and distention. nausea and vomiting EXAM: PORTABLE ABDOMEN - 1 VIEW COMPARISON: 04/14/2013 FINDINGS: No evidence of dilated bowel loops. Stool again seen in the rectosigmoid colon. Percutaneous gastrostomy tube seen overlying the stomach. Pelvic phleboliths noted. No definite radiopaque calculi visualized. IMPRESSION: Nonobstructive bowel gas pattern. No acute findings. Electronically Signed By: Myles Rosenthal M.D. On: 06/20/2013 17:41  Assessment/Plan  1. E. coli pyelonephritis  This has not reoccurred recently which is encouraging however certainly is fragile in this regard  2. Quadriplegia  -here for long term care due to this--dependent in adls, on tube feedings--routine use of baclofen  3. Parkinson's disease  -continues on sinemet  4. Anemia  -continues on iron supplement --I have reviewed consult notes I do not see a recent one from GI will write an order to see where we stand with this I do note per review of hospitalization records there was question of  some mild gastritis or peptic ulcer  disease-he is on a PPI and clinically has been stable we will need to update his hemoglobin  -f/u cbc  5. Adrenal insufficiency  -requires stress dose steroids during hospitalization, continues on florinef --also is on prednisone for glucocorticoid deficiency-- update hemoglobin A1c as well since he is on a steroid 6. Seizures  -cont on dilantin, will update Dilantin level-  no-recent seizures have been noted  . Heme positive FOBT  -f/u with GI -- -cont iron #8-I do noet mildly elevated liver function tests on previous lab will update a liver function test panel as well.  #9-history of allergic rhinitis he continues on Zyrtec routinely this apparently has not been an issue recently he's also on Patanol eyedrops--did not appear to have allergy symptoms this evening  Of note we will need to update CBC CMP and Dilantin level as well as a hemoglobin A1c   [id number]-of note greater than 35 minutes spent assessing patient-discussing his status with nursing staff-reviewing his medical records and previous GI consult notes-and coordinating plan of care for numerous diagnoses-of note greater than 50% of time spent coordinating plan of care.

## 2013-09-24 DIAGNOSIS — D649 Anemia, unspecified: Secondary | ICD-10-CM | POA: Diagnosis not present

## 2013-09-24 DIAGNOSIS — E119 Type 2 diabetes mellitus without complications: Secondary | ICD-10-CM | POA: Diagnosis not present

## 2013-09-24 LAB — BASIC METABOLIC PANEL
BUN: 18 mg/dL (ref 4–21)
Creatinine: 0.5 mg/dL — AB (ref 0.6–1.3)
Glucose: 96 mg/dL
POTASSIUM: 3.8 mmol/L (ref 3.4–5.3)
SODIUM: 134 mmol/L — AB (ref 137–147)

## 2013-09-24 LAB — CBC AND DIFFERENTIAL
HCT: 33 % — AB (ref 41–53)
Hemoglobin: 10.3 g/dL — AB (ref 13.5–17.5)
Platelets: 238 10*3/uL (ref 150–399)
WBC: 7 10^3/mL

## 2013-09-24 LAB — HEPATIC FUNCTION PANEL
ALT: 17 U/L (ref 10–40)
AST: 20 U/L (ref 14–40)
Alkaline Phosphatase: 139 U/L — AB (ref 25–125)
Bilirubin, Total: 0.2 mg/dL

## 2013-10-04 ENCOUNTER — Encounter: Payer: Self-pay | Admitting: Internal Medicine

## 2013-10-04 ENCOUNTER — Non-Acute Institutional Stay (SKILLED_NURSING_FACILITY): Payer: Medicare Other | Admitting: Internal Medicine

## 2013-10-04 DIAGNOSIS — G8250 Quadriplegia, unspecified: Secondary | ICD-10-CM | POA: Diagnosis not present

## 2013-10-04 DIAGNOSIS — H00019 Hordeolum externum unspecified eye, unspecified eyelid: Secondary | ICD-10-CM | POA: Diagnosis not present

## 2013-10-04 DIAGNOSIS — H00016 Hordeolum externum left eye, unspecified eyelid: Secondary | ICD-10-CM

## 2013-10-04 DIAGNOSIS — Z931 Gastrostomy status: Secondary | ICD-10-CM | POA: Insufficient documentation

## 2013-10-04 DIAGNOSIS — R1310 Dysphagia, unspecified: Secondary | ICD-10-CM | POA: Diagnosis not present

## 2013-10-04 HISTORY — DX: Gastrostomy status: Z93.1

## 2013-10-04 NOTE — Assessment & Plan Note (Signed)
Feeding trough PEG tube

## 2013-10-04 NOTE — Assessment & Plan Note (Signed)
Functioning

## 2013-10-04 NOTE — Progress Notes (Signed)
MRN: [id number] Name: [name]  Sex: male Age: 58 y.o. DOB: [birth year]  [name] Facility/Room: 214 Level Of Care: SNF Provider: Merrilee Seashore D Emergency Contacts: Extended Emergency Contact Information Primary Emergency Contact: Melnik,[name] Address: [address]          [place] [address] [place] Home Phone: [phone] Mobile Phone: [phone] Relation: Mother Secondary Emergency Contact: Valinda Party [place] Home Phone: [phone] Relation: Brother  Code Status:FULL   Allergies: Aspirin; Nsaids; and Penicillins  Chief Complaint  Patient presents with  . Acute Visit    HPI: Patient is 58 y.o. male who nursing has asked me to see for redness L eye and swelling upper lid.  Past Medical History  Diagnosis Date  . Quadriplegia   . Depression   . Anemia   . Parkinson's disease   . Seizures   . Adrenal insufficiency   . Dysphagia 06/19/2013  . Aphasia 06/19/2013    Past Surgical History  Procedure Laterality Date  . Peg tube placement    . Peripherally inserted central catheter insertion        Medication List       This list is accurate as of: 10/04/13 12:59 PM.  Always use your most recent med list.               acetaminophen 650 MG suppository  Commonly known as:  TYLENOL  Place 650 mg rectally every 4 (four) hours as needed for mild pain.     baclofen 10 MG tablet  Commonly known as:  LIORESAL  Give 10 mg by tube 3 (three) times daily.     carbidopa-levodopa 25-100 MG per tablet  Commonly known as:  SINEMET IR  1 tablet by PEG Tube route 3 (three) times daily.     cetirizine 5 MG tablet  Commonly known as:  ZYRTEC  5 mg by PEG Tube route every morning.     docusate 50 MG/5ML liquid  Commonly known as:  COLACE  Place 100 mg into feeding tube every 12 (twelve) hours as needed for mild constipation.     esomeprazole 20 MG capsule  Commonly known as:  NEXIUM  Give 20 mg by  tube daily before breakfast. Give 1 capsule via tube (do not crush). Mix in 50mL H2O, shake well, give per tube immediately and flush tube afterward.     ferrous sulfate 220 (44 FE) MG/5ML solution  330 mg by PEG Tube route every morning.     fludrocortisone 0.1 MG tablet  Commonly known as:  FLORINEF  0.1 mg by PEG Tube route every evening. Adrenocortical Insufficiency     folic acid 1 MG tablet  Commonly known as:  FOLVITE  1 mg by PEG Tube route every morning.     guaiFENesin 100 MG/5ML Soln  Commonly known as:  ROBITUSSIN  Place 10 mLs into feeding tube every 4 (four) hours as needed for cough or to loosen phlegm.     ipratropium-albuterol 0.5-2.5 (3) MG/3ML Soln  Commonly known as:  DUONEB  Take 3 mLs by nebulization every 4 (four) hours.     olopatadine 0.1 % ophthalmic solution  Commonly known as:  PATANOL  Place 1 drop into both eyes every morning.     phenytoin 125 MG/5ML suspension  Commonly known as:  DILANTIN  Place 100 mg into feeding tube 2 (two) times daily.     polyethylene glycol packet  Commonly known as:  MIRALAX /  GLYCOLAX  Take 17 g by mouth 2 (two) times daily as needed for mild constipation.     polyvinyl alcohol 1.4 % ophthalmic solution  Commonly known as:  LIQUIFILM TEARS  Place 1 drop into both eyes daily.     predniSONE 10 MG tablet  Commonly known as:  DELTASONE  Give 10 mg by tube every morning.     TWOCAL HN Liqd  Give 1 Can by tube continuous. Run@ 55cc/hr for 20 hrs. Turn off pump 1 hour before and keep off until 1 hour after phenytoin dose        No orders of the defined types were placed in this encounter.    Immunization History  Administered Date(s) Administered  . Influenza Whole 12/31/2012    History  Substance Use Topics  . Smoking status: Never Smoker   . Smokeless tobacco: Never Used  . Alcohol Use: No    Review of Systems  DATA OBTAINED: from  nurse, family member GENERAL: Feels well no fevers, fatigue,  appetite changes SKIN: No itching, rash HEENT: burning and itching, swelling lid RESPIRATORY: No cough, wheezing, SOB CARDIAC: No chest pain, palpitations, lower extremity edema  GI: No abdominal pain, No N/V/D or constipation, No heartburn or reflux  GU: No dysuria, frequency or urgency, or incontinence  MUSCULOSKELETAL: No unrelieved bone/joint pain NEUROLOGIC: No headache, dizziness  PSYCHIATRIC: No overt anxiety or sadness. Sleeps well.   Filed Vitals:   10/04/13 1249  BP: 132/97  Pulse: 72  Temp: 98.7 F (37.1 C)  Resp: 18    Physical Exam  GENERAL APPEARANCE: Alert, nonconversant. Appropriately groomed. No acute distress  SKIN: No diaphoresis rash HEENT: L upper lid mild diffuse swelling with hint of nodule medially; conjunctiva mod injected, no exudate RESPIRATORY: Breathing is even, unlabored. Lung sounds are clear   CARDIOVASCULAR: Heart RRR no murmurs, rubs or gallops. No peripheral edema  GASTROINTESTINAL: Abdomen is soft, non-tender, not distended w/ normal bowel sounds; PEG tube  GENITOURINARY: Bladder non tender, not distended  MUSCULOSKELETAL: contractures NEUROLOGIC: aphasia, quadriplegia PSYCHIATRIC: Mood and affect appropriate to situation, no behavioral issues  Patient Active Problem List   Diagnosis Date Noted  . PEG (percutaneous endoscopic gastrostomy) status 10/04/2013  . Rash and nonspecific skin eruption 07/08/2013  . Thrush 07/08/2013  . Heme positive stool 07/07/2013  . Severe sepsis 06/23/2013  . E. coli pyelonephritis 06/22/2013  . Leukocytosis 06/19/2013  . Bronchitis 06/19/2013  . Dysphagia 06/19/2013  . Aphasia 06/19/2013  . Fever, unspecified 06/19/2013  . Other convulsions 08/10/2012  . Iron deficiency anemia, unspecified 08/10/2012  . Glucocorticoid deficiency 08/10/2012  . Paralysis agitans 08/10/2012  . Nausea vomiting and diarrhea 06/15/2012  . Fever 06/15/2012  . UTI (lower urinary tract infection) 06/15/2012  . Hypokalemia  11/10/2011  . Nausea & vomiting 11/08/2011  . Ileus 11/08/2011  . Hyponatremia 11/08/2011  . Adrenal insufficiency 11/08/2011  . Quadriplegia 11/08/2011  . Parkinson's disease   . Seizures   . Anemia   . Depression     CBC    Component Value Date/Time   WBC 7.8 06/24/2013 0310   RBC 3.97* 06/24/2013 0310   HGB 9.3* 06/24/2013 0310   HCT 29.5* 06/24/2013 0310   PLT 223 06/24/2013 0310   MCV 74.3* 06/24/2013 0310   LYMPHSABS 1.2 06/19/2013 1555   MONOABS 2.1* 06/19/2013 1555   EOSABS 0.1 06/19/2013 1555   BASOSABS 0.0 06/19/2013 1555    CMP     Component Value Date/Time   NA 137  06/24/2013 0310   K 3.3* 06/24/2013 0310   CL 99 06/24/2013 0310   CO2 27 06/24/2013 0310   GLUCOSE 85 06/24/2013 0310   BUN 15 06/24/2013 0310   CREATININE 0.45* 06/24/2013 0310   CALCIUM 8.8 06/24/2013 0310   PROT 6.7 06/21/2013 0910   ALBUMIN 2.6* 06/21/2013 0910   AST 55* 06/21/2013 0910   ALT 24 06/21/2013 0910   ALKPHOS 104 06/21/2013 0910   BILITOT <0.2* 06/21/2013 0910   GFRNONAA >90 06/24/2013 0310   GFRAA >90 06/24/2013 0310    Assessment and Plan  HORDEOLUM L upper lid - warm compresses TID until resolved; garamycin eye drops QID until resolved  Quadriplegia Noted  Dysphagia Feeding trough PEG tube  PEG (percutaneous endoscopic gastrostomy) status Functioning     Margit Hanks, MD

## 2013-10-04 NOTE — Assessment & Plan Note (Signed)
Noted  

## 2013-10-21 DIAGNOSIS — K921 Melena: Secondary | ICD-10-CM | POA: Diagnosis not present

## 2013-10-21 DIAGNOSIS — G378 Other specified demyelinating diseases of central nervous system: Secondary | ICD-10-CM | POA: Diagnosis not present

## 2013-10-21 DIAGNOSIS — Z86010 Personal history of colonic polyps: Secondary | ICD-10-CM | POA: Diagnosis not present

## 2013-10-24 DIAGNOSIS — D649 Anemia, unspecified: Secondary | ICD-10-CM | POA: Diagnosis not present

## 2013-10-30 ENCOUNTER — Encounter: Payer: Self-pay | Admitting: Internal Medicine

## 2013-10-30 ENCOUNTER — Non-Acute Institutional Stay: Payer: Self-pay | Admitting: Internal Medicine

## 2013-10-30 DIAGNOSIS — R1310 Dysphagia, unspecified: Secondary | ICD-10-CM

## 2013-10-30 DIAGNOSIS — R21 Rash and other nonspecific skin eruption: Secondary | ICD-10-CM

## 2013-10-30 DIAGNOSIS — D500 Iron deficiency anemia secondary to blood loss (chronic): Secondary | ICD-10-CM

## 2013-10-30 DIAGNOSIS — E2740 Unspecified adrenocortical insufficiency: Secondary | ICD-10-CM

## 2013-10-30 DIAGNOSIS — G20 Parkinson's disease: Secondary | ICD-10-CM

## 2013-10-30 DIAGNOSIS — G8250 Quadriplegia, unspecified: Secondary | ICD-10-CM

## 2013-10-30 DIAGNOSIS — R195 Other fecal abnormalities: Secondary | ICD-10-CM

## 2013-10-30 DIAGNOSIS — R569 Unspecified convulsions: Secondary | ICD-10-CM

## 2013-10-30 NOTE — Assessment & Plan Note (Signed)
He has a hx of viral encephalopathy that led to his current condition. He is stable and has no complaints. Continue current care and monitoring.

## 2013-10-30 NOTE — Assessment & Plan Note (Addendum)
He has seborrhea to his face and neck. Apply clotrimazole 1% cream BID for 7 days.

## 2013-10-30 NOTE — Assessment & Plan Note (Signed)
Stable on Sinemet. No change at this time. I am not sure who started this med. The chart did not reflect if this was followed by neuro.  Would continue for now and consider tapering off in the future.

## 2013-10-30 NOTE — Progress Notes (Unsigned)
Patient ID: [name], male   DOB: Apr 15, 1955, 58 y.o.   MRN: [id number]   [facility] [phone])  Code Status:  Full Code  Chief Complaint  Patient presents with  . Medical Management of Chronic Issues    HPI: This is a 58 y.o. Male with a hx of quadriparesis aphasia, and dysphagia. He is fed via tube and seems to be tolerating this well. There are no issues per the nursing staff. He was in the hospital in March of 2015 with a MDR UTI. He was treated for sepsis but was also noted to have heme positive stool. A GI consult was ordered. He was seen by Dr. Lanae Boast and a virtual colonoscopy was recommended on 10/21/13. His Hgb has been fairly stable at 10.3   Allergies  Allergen Reactions  . Aspirin     unk reaction  . Nsaids     unk reaction  . Penicillins     unk reaction    MEDICATIONS -  Reviewed Outpatient Encounter Prescriptions as of 10/30/2013  Medication Sig  . acetaminophen (TYLENOL) 650 MG suppository Place 650 mg rectally every 4 (four) hours as needed for mild pain.  . baclofen (LIORESAL) 10 MG tablet Give 10 mg by tube 3 (three) times daily.  . carbidopa-levodopa (SINEMET) 25-100 MG per tablet 1 tablet by PEG Tube route 3 (three) times daily.   . cetirizine (ZYRTEC) 5 MG tablet 5 mg by PEG Tube route every morning.   . docusate (COLACE) 50 MG/5ML liquid Place 100 mg into feeding tube every 12 (twelve) hours as needed for mild constipation.  Marland Kitchen esomeprazole (NEXIUM) 20 MG capsule Give 20 mg by tube daily before breakfast. Give 1 capsule via tube (do not crush). Mix in 50mL H2O, shake well, give per tube immediately and flush tube afterward.  . ferrous sulfate 220 (44 FE) MG/5ML solution 330 mg by PEG Tube route every morning.   . fludrocortisone (FLORINEF) 0.1 MG tablet 0.1 mg by PEG Tube route every evening. Adrenocortical Insufficiency  . folic acid (FOLVITE) 1 MG tablet 1 mg by PEG Tube route every morning.   Marland Kitchen guaiFENesin (ROBITUSSIN) 100  MG/5ML SOLN Place 10 mLs into feeding tube every 4 (four) hours as needed for cough or to loosen phlegm.  Marland Kitchen ipratropium-albuterol (DUONEB) 0.5-2.5 (3) MG/3ML SOLN Take 3 mLs by nebulization every 4 (four) hours.  . Nutritional Supplements (TWOCAL HN) LIQD Give 1 Can by tube continuous. Run@ 55cc/hr for 20 hrs. Turn off pump 1 hour before and keep off until 1 hour after phenytoin dose  . olopatadine (PATANOL) 0.1 % ophthalmic solution Place 1 drop into both eyes every morning.   . phenytoin (DILANTIN) 125 MG/5ML suspension Place 100 mg into feeding tube 2 (two) times daily.   . polyethylene glycol (MIRALAX / GLYCOLAX) packet Take 17 g by mouth 2 (two) times daily as needed for mild constipation.  . polyvinyl alcohol (LIQUIFILM TEARS) 1.4 % ophthalmic solution Place 1 drop into both eyes daily.   . predniSONE (DELTASONE) 10 MG tablet Give 10 mg by tube every morning.     DATA REVIEWED  Radiologic Exams CT of the abd (06/22/13) IMPRESSION:  1. No acute abdominal or pelvic abnormality and no obstruction.  2. Stable mild hepatic steatosis.  3. Dense right lower lobe atelectasis and mild left lower lobe  atelectasis.  4. Small to moderate sized partially loculated right pleural  effusion and minimal left pleural effusion.   Cardiovascular Exams 06/22/12:  12 lead EKG: NSR   Laboratory Studies Lab Results  Component Value Date   WBC 7.0 09/24/2013   HGB 10.3* 09/24/2013   HCT 33* 09/24/2013   MCV 74.3* 06/24/2013   PLT 238 09/24/2013     Chemistry      Component Value Date/Time   NA 134* 09/24/2013   NA 137 06/24/2013 0310   K 3.8 09/24/2013   CL 99 06/24/2013 0310   CO2 27 06/24/2013 0310   BUN 18 09/24/2013   BUN 15 06/24/2013 0310   CREATININE 0.5* 09/24/2013   CREATININE 0.45* 06/24/2013 0310   GLU 96 09/24/2013      Component Value Date/Time   CALCIUM 8.8 06/24/2013 0310   ALKPHOS 139* 09/24/2013   AST 20 09/24/2013   ALT 17 09/24/2013   BILITOT <0.2* 06/21/2013 0910     09/3013: Fe  42, TIBC 301, %sat 14, ferritin 146, Dilantin 16.5   REVIEW OF SYSTEMS  Significant aphasia, nods no to pain and smiles  PHYSICAL EXAM Filed Vitals:   10/30/13 1109  BP: 125/70  Pulse: 72  Resp: 18  Weight: 156 lb 3.2 oz (70.852 kg)   Body mass index is 20.05 kg/(m^2). GENERAL APPEARANCE: No acute distress, appropriately groomed, normal body habitus Alert, pleasant SKIN: No diaphoresis. Peg site WNL. Erythema and scaly skin to face and neck. HEAD: Normocephalic, atraumatic EYES: Conjunctiva/lids clear  RESPIRATORY: Breathing is even, unlabored  Lung sounds with bilateral rhonchi CARDIOVASCULAR: Heart RRR   No murmur or extra heart sounds   EDEMA: No peripheral edema   ABD: BSx4, non distended, non tender MUSCULOSKELETAL.  Quadriparesis, wrist drop bilaterally. Has some movement to this legs and arms. Decreased ROM to the left shoulder and elbow more so than the right. No tremor noted. PSYCHIATRIC: Mood and affect appropriate to situation. Pleasant, smiles and follows commands. Not able verbalize  ASSESSMENT/PLAN  Quadriplegia He has a hx of viral encephalopathy that led to his current condition. He is stable and has no complaints. Continue current care and monitoring.   Parkinson's disease Stable on Sinemet. No change at this time. I am not sure who started this med. The chart did not reflect if this was followed by neuro.  Would continue for now and consider tapering off in the future.  Heme positive stool Continue Nexium. F/U with GI for colonoscopy. No active bleeding currently.  Anemia Continue ferrous sulfate. Last Hgb 10.3.  Iron studies show a normal ferritin level.   Seizures No reported seizure activity. Currently on Dilantin 125mg  BID. Dilantin level 16.5 on 10/24/13. Monitor level monthly.  Dysphagia Continue tube feeding, aspiration prec.  Rash and nonspecific skin eruption He has seborrhea to his face and neck. Apply clotrimazole 1% cream BID for 7  days.  Adrenal insufficiency He has a documented hx of adrenal and mineralacorticoid def. Continue florinef and prednisone. Sodium, potassium, and glucose are WNL at this time. Last A1C in June was 5.4%. Will await colonoscopy results.    Fletcher Anon RN, MSN/Anne Alexander M.D. 10/30/2013

## 2013-10-30 NOTE — Assessment & Plan Note (Addendum)
He has a documented hx of adrenal and mineralacorticoid def. Continue florinef and prednisone. Sodium, potassium, and glucose are WNL at this time. Last A1C in June was 5.4%. Will await colonoscopy results.

## 2013-10-30 NOTE — Assessment & Plan Note (Signed)
Continue Nexium. F/U with GI for colonoscopy. No active bleeding currently.

## 2013-10-30 NOTE — Assessment & Plan Note (Signed)
Continue tube feeding, aspiration prec.

## 2013-10-30 NOTE — Assessment & Plan Note (Addendum)
No reported seizure activity. Currently on Dilantin 125mg  BID. Dilantin level 16.5 on 10/24/13. Monitor level monthly.

## 2013-10-30 NOTE — Assessment & Plan Note (Addendum)
Continue ferrous sulfate. Last Hgb 10.3.  Iron studies show a normal ferritin level.

## 2013-11-13 DIAGNOSIS — Z931 Gastrostomy status: Secondary | ICD-10-CM | POA: Diagnosis not present

## 2013-11-13 DIAGNOSIS — G8250 Quadriplegia, unspecified: Secondary | ICD-10-CM | POA: Diagnosis not present

## 2013-11-13 DIAGNOSIS — K921 Melena: Secondary | ICD-10-CM | POA: Diagnosis not present

## 2013-11-13 DIAGNOSIS — R911 Solitary pulmonary nodule: Secondary | ICD-10-CM | POA: Diagnosis not present

## 2013-12-11 ENCOUNTER — Encounter: Payer: Self-pay | Admitting: Internal Medicine

## 2013-12-11 ENCOUNTER — Non-Acute Institutional Stay (SKILLED_NURSING_FACILITY): Payer: Medicare Other | Admitting: Internal Medicine

## 2013-12-11 DIAGNOSIS — G8250 Quadriplegia, unspecified: Secondary | ICD-10-CM | POA: Diagnosis not present

## 2013-12-11 DIAGNOSIS — R21 Rash and other nonspecific skin eruption: Secondary | ICD-10-CM

## 2013-12-11 DIAGNOSIS — R4701 Aphasia: Secondary | ICD-10-CM | POA: Diagnosis not present

## 2013-12-11 DIAGNOSIS — R569 Unspecified convulsions: Secondary | ICD-10-CM

## 2013-12-11 DIAGNOSIS — E2740 Unspecified adrenocortical insufficiency: Secondary | ICD-10-CM

## 2013-12-11 DIAGNOSIS — E2749 Other adrenocortical insufficiency: Secondary | ICD-10-CM

## 2013-12-11 DIAGNOSIS — G20 Parkinson's disease: Secondary | ICD-10-CM

## 2013-12-11 NOTE — Assessment & Plan Note (Signed)
On fliorinef and prednisone

## 2013-12-11 NOTE — Progress Notes (Signed)
MRN: [id number] Name: [name]  Sex: male Age: 58 y.o. DOB: 1955/08/30  [name] Facility/Room: 214 Level Of Care: SNF Provider: Merrilee Seashore D Emergency Contacts: Extended Emergency Contact Information Primary Emergency Contact: Kotula,[name] Address: [address]          [place] [address] [place] Home Phone: [phone] Mobile Phone: [phone] Relation: Mother Secondary Emergency Contact: Valinda Party [place] Home Phone: [phone] Relation: Brother    Allergies: Aspirin; Nsaids; and Penicillins  Chief Complaint  Patient presents with  . Acute Visit  . Medical Management of Chronic Issues    HPI: Patient is 58 y.o. male who is being seen for possible skin rash and routine issues.  Past Medical History  Diagnosis Date  . Quadriplegia   . Depression   . Anemia   . Parkinson's disease   . Seizures   . Adrenal insufficiency   . Dysphagia 06/19/2013  . Aphasia 06/19/2013    Past Surgical History  Procedure Laterality Date  . Peg tube placement    . Peripherally inserted central catheter insertion        Medication List       This list is accurate as of: 12/11/13  7:12 PM.  Always use your most recent med list.               acetaminophen 650 MG suppository  Commonly known as:  TYLENOL  Place 650 mg rectally every 4 (four) hours as needed for mild pain.     baclofen 10 MG tablet  Commonly known as:  LIORESAL  Give 10 mg by tube 3 (three) times daily.     carbidopa-levodopa 25-100 MG per tablet  Commonly known as:  SINEMET IR  1 tablet by PEG Tube route 3 (three) times daily.     cetirizine 5 MG tablet  Commonly known as:  ZYRTEC  5 mg by PEG Tube route every morning.     docusate 50 MG/5ML liquid  Commonly known as:  COLACE  Place 100 mg into feeding tube every 12 (twelve) hours as needed for mild constipation.     esomeprazole 20 MG capsule  Commonly known as:  NEXIUM  Give  20 mg by tube daily before breakfast. Give 1 capsule via tube (do not crush). Mix in 50mL H2O, shake well, give per tube immediately and flush tube afterward.     ferrous sulfate 220 (44 FE) MG/5ML solution  330 mg by PEG Tube route every morning.     fludrocortisone 0.1 MG tablet  Commonly known as:  FLORINEF  0.1 mg by PEG Tube route every evening. Adrenocortical Insufficiency     folic acid 1 MG tablet  Commonly known as:  FOLVITE  1 mg by PEG Tube route every morning.     guaiFENesin 100 MG/5ML Soln  Commonly known as:  ROBITUSSIN  Place 10 mLs into feeding tube every 4 (four) hours as needed for cough or to loosen phlegm.     ipratropium-albuterol 0.5-2.5 (3) MG/3ML Soln  Commonly known as:  DUONEB  Take 3 mLs by nebulization every 4 (four) hours.     olopatadine 0.1 % ophthalmic solution  Commonly known as:  PATANOL  Place 1 drop into both eyes every morning.     phenytoin 125 MG/5ML suspension  Commonly known as:  DILANTIN  Place 100 mg into feeding tube 2 (two) times daily.     polyethylene glycol packet  Commonly known as:  MIRALAX / GLYCOLAX  Take 17 g by mouth 2 (two) times daily as needed for mild constipation.     polyvinyl alcohol 1.4 % ophthalmic solution  Commonly known as:  LIQUIFILM TEARS  Place 1 drop into both eyes daily.     predniSONE 10 MG tablet  Commonly known as:  DELTASONE  Give 10 mg by tube every morning.     TWOCAL HN Liqd  Give 1 Can by tube continuous. Run@ 55cc/hr for 20 hrs. Turn off pump 1 hour before and keep off until 1 hour after phenytoin dose        No orders of the defined types were placed in this encounter.    Immunization History  Administered Date(s) Administered  . Influenza Whole 12/31/2012    History  Substance Use Topics  . Smoking status: Never Smoker   . Smokeless tobacco: Never Used  . Alcohol Use: No    Review of Systems      UTO ;nursing made me aware of rash that waxes and wanes   PSYCHIATRIC: No  overt anxiety or sadness. Sleeps well.   Filed Vitals:   12/11/13 1454  BP: 125/70  Pulse: 72  Temp: 98.7 F (37.1 C)  Resp: 18    Physical Exam  GENERAL APPEARANCE: Alert, nonconversant. Appropriately groomed. No acute distress  SKIN: trace of slt scale L face and neck HEENT: Unremarkable RESPIRATORY: Breathing is even, unlabored. Lung sounds are clear   CARDIOVASCULAR: Heart RRR no murmurs, rubs or gallops. No peripheral edema  GASTROINTESTINAL: Abdomen is soft, non-tender, not distended w/ normal bowel sounds.  GENITOURINARY: Bladder non tender, not distended  MUSCULOSKELETAL: contractures NEUROLOGIC: Cranial nerves intact; no movement of extremities PSYCHIATRIC: Mood and affect appropriate to situation, no behavioral issues  Patient Active Problem List   Diagnosis Date Noted  . PEG (percutaneous endoscopic gastrostomy) status 10/04/2013  . Rash and nonspecific skin eruption 07/08/2013  . Thrush 07/08/2013  . Heme positive stool 07/07/2013  . Severe sepsis 06/23/2013  . E. coli pyelonephritis 06/22/2013  . Leukocytosis 06/19/2013  . Bronchitis 06/19/2013  . Dysphagia 06/19/2013  . Aphasia 06/19/2013  . Fever, unspecified 06/19/2013  . Other convulsions 08/10/2012  . Iron deficiency anemia, unspecified 08/10/2012  . Glucocorticoid deficiency 08/10/2012  . Paralysis agitans 08/10/2012  . Nausea vomiting and diarrhea 06/15/2012  . Fever 06/15/2012  . UTI (lower urinary tract infection) 06/15/2012  . Hypokalemia 11/10/2011  . Nausea & vomiting 11/08/2011  . Ileus 11/08/2011  . Hyponatremia 11/08/2011  . Adrenal insufficiency 11/08/2011  . Quadriplegia 11/08/2011  . Parkinson's disease   . Seizures   . Anemia   . Depression     CBC    Component Value Date/Time   WBC 7.0 09/24/2013   WBC 7.8 06/24/2013 0310   RBC 3.97* 06/24/2013 0310   HGB 10.3* 09/24/2013   HCT 33* 09/24/2013   PLT 238 09/24/2013   MCV 74.3* 06/24/2013 0310   LYMPHSABS 1.2 06/19/2013 1555    MONOABS 2.1* 06/19/2013 1555   EOSABS 0.1 06/19/2013 1555   BASOSABS 0.0 06/19/2013 1555    CMP     Component Value Date/Time   NA 134* 09/24/2013   NA 137 06/24/2013 0310   K 3.8 09/24/2013   CL 99 06/24/2013 0310   CO2 27 06/24/2013 0310   GLUCOSE 85 06/24/2013 0310   BUN 18 09/24/2013   BUN 15 06/24/2013 0310   CREATININE 0.5* 09/24/2013   CREATININE 0.45* 06/24/2013 0310   CALCIUM 8.8 06/24/2013  0310   PROT 6.7 06/21/2013 0910   ALBUMIN 2.6* 06/21/2013 0910   AST 20 09/24/2013   ALT 17 09/24/2013   ALKPHOS 139* 09/24/2013   BILITOT <0.2* 06/21/2013 0910   GFRNONAA >90 06/24/2013 0310   GFRAA >90 06/24/2013 0310    Assessment and Plan  Rash and nonspecific skin eruption Reported that pt has rash on face, on and off;Mom wants to send pt to dermatology;Nursing thinks Mom has put lotions on his face, which now that I see pt I tend to agree. Room is filled with lotions most of which could cause a non specific rash and today rash area is barely detectable;this is a case where less is better  Quadriplegia 2/2 viral encephalopathy;no change in condition  Aphasia Unchanged  Other convulsions Pt on Dilantin;no reported seizures  Adrenal insufficiency On fliorinef and prednisone  Glucocorticoid deficiency On daily prednisone  Parkinson's disease On sinemet, baclofen for contractures    Margit Hanks, MD

## 2013-12-11 NOTE — Assessment & Plan Note (Signed)
Reported that pt has rash on face, on and off;Mom wants to send pt to dermatology;Nursing thinks Mom has put lotions on his face, which now that I see pt I tend to agree. Room is filled with lotions most of which could cause a non specific rash and today rash area is barely detectable;this is a case where less is better

## 2013-12-11 NOTE — Assessment & Plan Note (Signed)
Unchanged

## 2013-12-11 NOTE — Assessment & Plan Note (Signed)
2/2 viral encephalopathy;no change in condition

## 2013-12-11 NOTE — Assessment & Plan Note (Signed)
On sinemet, baclofen for contractures

## 2013-12-11 NOTE — Assessment & Plan Note (Signed)
On daily prednisone

## 2013-12-11 NOTE — Assessment & Plan Note (Signed)
Pt on Dilantin;no reported seizures

## 2013-12-24 DIAGNOSIS — N390 Urinary tract infection, site not specified: Secondary | ICD-10-CM | POA: Diagnosis not present

## 2013-12-24 DIAGNOSIS — D508 Other iron deficiency anemias: Secondary | ICD-10-CM | POA: Diagnosis not present

## 2013-12-25 DIAGNOSIS — N390 Urinary tract infection, site not specified: Secondary | ICD-10-CM | POA: Diagnosis not present

## 2013-12-25 DIAGNOSIS — Z431 Encounter for attention to gastrostomy: Secondary | ICD-10-CM | POA: Diagnosis not present

## 2013-12-26 ENCOUNTER — Non-Acute Institutional Stay (SKILLED_NURSING_FACILITY): Payer: Medicare Other | Admitting: Internal Medicine

## 2013-12-26 DIAGNOSIS — R112 Nausea with vomiting, unspecified: Secondary | ICD-10-CM | POA: Diagnosis not present

## 2013-12-26 DIAGNOSIS — M546 Pain in thoracic spine: Secondary | ICD-10-CM | POA: Diagnosis not present

## 2013-12-26 NOTE — Progress Notes (Signed)
.  Facility [facility]. Patient ID: [name], male   DOB: 02/26/1956, 58 y.o.   MRN: [id number]   this is an acute visit.  Level of care skilled.  Facility AF.   Chief Complaint   Patient presents with   .  Acute Visit   .   sec  to pain management   HPI: Patient is 58 y.o. male who is being seen for pain management Patient apparently has been complaining of some increased pain-possibly back pain-there's been no recent history of trauma.  Patient is aphasic but can communicate somewhat using an information board.  It appears his only pain medication is Tylenol every 4 hours when necessary.  He does have a history of quadriplegia as well as Parkinson's disease and seizures he is a phasic  Of note her urine culture is pending-final results are unavailable-vital signs have been stable he has been afebrile.    .  Past Medical History   Diagnosis  Date   .  Quadriplegia    .  Depression    .  Anemia    .  Parkinson's disease    .  Seizures    .  Adrenal insufficiency    .  Dysphagia  06/19/2013   .  Aphasia  06/19/2013    Past Surgical History   Procedure  Laterality  Date   .  Peg tube placement     .  Peripherally inserted central catheter insertion        Medication List         .            acetaminophen 650 MG suppository    Commonly known as: TYLENOL    Place 650 mg rectally every 4 (four) hours as needed for mild pain.    baclofen 10 MG tablet    Commonly known as: LIORESAL    Give 10 mg by tube 3 (three) times daily.    carbidopa-levodopa 25-100 MG per tablet    Commonly known as: SINEMET IR    1 tablet by PEG Tube route 3 (three) times daily.    cetirizine 5 MG tablet    Commonly known as: ZYRTEC    5 mg by PEG Tube route every morning.    docusate 50 MG/5ML liquid    Commonly known as: COLACE    Place 100 mg into feeding tube every 12 (twelve) hours as needed for mild constipation.    esomeprazole 20 MG capsule    Commonly known as: NEXIUM    Give  20 mg by tube daily before breakfast. Give 1 capsule via tube (do not crush). Mix in 50mL H2O, shake well, give per tube immediately and flush tube afterward.    ferrous sulfate 220 (44 FE) MG/5ML solution    330 mg by PEG Tube route every morning.    fludrocortisone 0.1 MG tablet    Commonly known as: FLORINEF    0.1 mg by PEG Tube route every evening. Adrenocortical Insufficiency    folic acid 1 MG tablet    Commonly known as: FOLVITE    1 mg by PEG Tube route every morning.    guaiFENesin 100 MG/5ML Soln    Commonly known as: ROBITUSSIN    Place 10 mLs into feeding tube every 4 (four) hours as needed for cough or to loosen phlegm.    ipratropium-albuterol 0.5-2.5 (3) MG/3ML Soln    Commonly known as: DUONEB    Take 3 mLs by nebulization every  4 (four) hours.    olopatadine 0.1 % ophthalmic solution    Commonly known as: PATANOL    Place 1 drop into both eyes every morning.    phenytoin 125 MG/5ML suspension    Commonly known as: DILANTIN    Place 100 mg into feeding tube 2 (two) times daily.    polyethylene glycol packet    Commonly known as: MIRALAX / GLYCOLAX    Take 17 g by mouth 2 (two) times daily as needed for mild constipation.    polyvinyl alcohol 1.4 % ophthalmic solution    Commonly known as: LIQUIFILM TEARS    Place 1 drop into both eyes daily.    predniSONE 10 MG tablet    Commonly known as: DELTASONE    Give 10 mg by tube every morning.    TWOCAL HN Liqd    Give 1 Can by tube continuous. Run@ 55cc/hr for 20 hrs. Turn off pump 1 hour before and keep off until 1 hour after phenytoin dose     No orders of the defined types were placed in this encounter.  Immunization History   Administered  Date(s) Administered   .  Influenza Whole  12/31/2012    History   Substance Use Topics   .  Smoking status:  Never Smoker   .  Smokeless tobacco:  Never Used   .  Alcohol Use:  No   Review of Systems somewhat limited secondary to patient's aphasic status-apparently there  have been occasional episodes of vomiting.  When asked using the information board indicates his pain is about 6-7/10  Nursing staff patient remains essentially at his baseline                      Physical Exam  Temperature 99.2 pulse 90 respirations 18 blood pressure 109/70 always appear relatively baseline.   GENERAL APPEARANCE: Alert, nonconversant. Appropriately groomed. No acute distress  SKIN: warm and dry  HEENT: Unremarkable  RESPIRATORY: Breathing is even, unlabored. Lung sounds are clear  CARDIOVASCULAR: Heart RRR no murmurs, rubs or gallops. No peripheral edema  GASTROINTESTINAL: Abdomen is soft, non-tender, not distended w/ normal bowel sounds slightly hypoactive.  GENITOURINARY: Bladder non tender, not distended  MUSCULOSKELETAL: contractures --has some generalized stiffness of his back he does not appear to be acutely changed there is some discomfort when patient is moved forward in bed but this did not appear to be entirely new either NEUROLOGIC: Cranial nerves intact; no movement of extremities  PSYCHIATRIC: Mood and affect appropriate to situation, no behavioral issues  Patient Active Problem List    Diagnosis  Date Noted   .  PEG (percutaneous endoscopic gastrostomy) status  10/04/2013   .  Rash and nonspecific skin eruption  07/08/2013   .  Thrush  07/08/2013   .  Heme positive stool  07/07/2013   .  Severe sepsis  06/23/2013   .  E. coli pyelonephritis  06/22/2013   .  Leukocytosis  06/19/2013   .  Bronchitis  06/19/2013   .  Dysphagia  06/19/2013   .  Aphasia  06/19/2013   .  Fever, unspecified  06/19/2013   .  Other convulsions  08/10/2012   .  Iron deficiency anemia, unspecified  08/10/2012   .  Glucocorticoid deficiency  08/10/2012   .  Paralysis agitans  08/10/2012   .  Nausea vomiting and diarrhea  06/15/2012   .  Fever  06/15/2012   .  UTI (lower  urinary tract infection)  06/15/2012   .  Hypokalemia  11/10/2011   .  Nausea & vomiting   11/08/2011   .  Ileus  11/08/2011   .  Hyponatremia  11/08/2011   .  Adrenal insufficiency  11/08/2011   .  Quadriplegia  11/08/2011   .  Parkinson's disease    .  Seizures    .  Anemia    .  Depression      Labs.  10/24/2013.  Iron 42-total iron binding capacity 301 iron saturation 14.  Ferritin 146.  Dilantin level XVI.5.     Component  Value  Date/Time    WBC  7.0  09/24/2013    WBC  7.8  06/24/2013 0310    RBC  3.97*  06/24/2013 0310    HGB  10.3*  09/24/2013    HCT  33*  09/24/2013    PLT  238  09/24/2013    MCV  74.3*  06/24/2013 0310    LYMPHSABS  1.2  06/19/2013 1555    MONOABS  2.1*  06/19/2013 1555    EOSABS  0.1  06/19/2013 1555    BASOSABS  0.0  06/19/2013 1555   CMP    Component  Value  Date/Time    NA  134*  09/24/2013    NA  137  06/24/2013 0310    K  3.8  09/24/2013    CL  99  06/24/2013 0310    CO2  27  06/24/2013 0310    GLUCOSE  85  06/24/2013 0310    BUN  18  09/24/2013    BUN  15  06/24/2013 0310    CREATININE  0.5*  09/24/2013    CREATININE  0.45*  06/24/2013 0310    CALCIUM  8.8  06/24/2013 0310    PROT  6.7  06/21/2013 0910    ALBUMIN  2.6*  06/21/2013 0910    AST  20  09/24/2013    ALT  17  09/24/2013    ALKPHOS  139*  09/24/2013    BILITOT  <0.2*  06/21/2013 0910    GFRNONAA  >90  06/24/2013 0310    GFRAA  >90  06/24/2013 0310   Assessment and Plan  Pain-this appears to be somewhat more located in the back although difficult to  tell-at rest patient appears to be fairly comfortable-Will start when necessary tramadol 50 mg every 6 hours when necessary for hopefully this will give a little bit more relief than the Tylenol-this will have to be monitored.  Also will x-ray his spine .  #2-vomiting-apparently this is intermittent -tube feedings are held at timesI see an x-ray of the abdomen was ordered yesterday will write order to obtain those results  2 Will.update Lab work including a CBC with differential and comprehensive metabolic panel.  Of note also a  urine culture is pending will await results  --  this was discussed with Dr. Lyn Hollingshead via phone.   [id number]

## 2013-12-27 DIAGNOSIS — R112 Nausea with vomiting, unspecified: Secondary | ICD-10-CM | POA: Diagnosis not present

## 2013-12-27 DIAGNOSIS — M546 Pain in thoracic spine: Secondary | ICD-10-CM | POA: Diagnosis not present

## 2013-12-27 DIAGNOSIS — M545 Low back pain: Secondary | ICD-10-CM | POA: Diagnosis not present

## 2014-01-01 ENCOUNTER — Inpatient Hospital Stay (HOSPITAL_COMMUNITY)
Admission: EM | Admit: 2014-01-01 | Discharge: 2014-01-06 | DRG: 064 | Disposition: A | Discharge status: Skilled Nursing Facility | Payer: Medicare Other | Attending: Internal Medicine | Admitting: Internal Medicine

## 2014-01-01 ENCOUNTER — Encounter (HOSPITAL_COMMUNITY): Payer: Self-pay | Admitting: Emergency Medicine

## 2014-01-01 DIAGNOSIS — R05 Cough: Secondary | ICD-10-CM | POA: Diagnosis not present

## 2014-01-01 DIAGNOSIS — R0602 Shortness of breath: Secondary | ICD-10-CM | POA: Diagnosis not present

## 2014-01-01 DIAGNOSIS — I6200 Nontraumatic subdural hemorrhage, unspecified: Secondary | ICD-10-CM | POA: Diagnosis not present

## 2014-01-01 DIAGNOSIS — G9341 Metabolic encephalopathy: Secondary | ICD-10-CM | POA: Diagnosis present

## 2014-01-01 DIAGNOSIS — E2740 Unspecified adrenocortical insufficiency: Secondary | ICD-10-CM | POA: Diagnosis present

## 2014-01-01 DIAGNOSIS — N189 Chronic kidney disease, unspecified: Secondary | ICD-10-CM | POA: Diagnosis present

## 2014-01-01 DIAGNOSIS — J9602 Acute respiratory failure with hypercapnia: Secondary | ICD-10-CM | POA: Diagnosis not present

## 2014-01-01 DIAGNOSIS — F329 Major depressive disorder, single episode, unspecified: Secondary | ICD-10-CM | POA: Diagnosis present

## 2014-01-01 DIAGNOSIS — G8250 Quadriplegia, unspecified: Secondary | ICD-10-CM | POA: Diagnosis present

## 2014-01-01 DIAGNOSIS — I6201 Nontraumatic acute subdural hemorrhage: Secondary | ICD-10-CM | POA: Diagnosis not present

## 2014-01-01 DIAGNOSIS — R059 Cough, unspecified: Secondary | ICD-10-CM

## 2014-01-01 DIAGNOSIS — N390 Urinary tract infection, site not specified: Secondary | ICD-10-CM | POA: Diagnosis not present

## 2014-01-01 DIAGNOSIS — K5641 Fecal impaction: Secondary | ICD-10-CM | POA: Diagnosis not present

## 2014-01-01 DIAGNOSIS — S065X9A Traumatic subdural hemorrhage with loss of consciousness of unspecified duration, initial encounter: Secondary | ICD-10-CM

## 2014-01-01 DIAGNOSIS — J9811 Atelectasis: Secondary | ICD-10-CM | POA: Diagnosis not present

## 2014-01-01 DIAGNOSIS — I6203 Nontraumatic chronic subdural hemorrhage: Principal | ICD-10-CM | POA: Diagnosis present

## 2014-01-01 DIAGNOSIS — G20 Parkinson's disease: Secondary | ICD-10-CM | POA: Diagnosis present

## 2014-01-01 DIAGNOSIS — R400 Somnolence: Secondary | ICD-10-CM | POA: Diagnosis not present

## 2014-01-01 DIAGNOSIS — Z931 Gastrostomy status: Secondary | ICD-10-CM

## 2014-01-01 DIAGNOSIS — Z4682 Encounter for fitting and adjustment of non-vascular catheter: Secondary | ICD-10-CM | POA: Diagnosis not present

## 2014-01-01 DIAGNOSIS — R4701 Aphasia: Secondary | ICD-10-CM | POA: Diagnosis present

## 2014-01-01 DIAGNOSIS — S065XAA Traumatic subdural hemorrhage with loss of consciousness status unknown, initial encounter: Secondary | ICD-10-CM

## 2014-01-01 DIAGNOSIS — B9689 Other specified bacterial agents as the cause of diseases classified elsewhere: Secondary | ICD-10-CM | POA: Diagnosis not present

## 2014-01-01 DIAGNOSIS — D649 Anemia, unspecified: Secondary | ICD-10-CM | POA: Diagnosis present

## 2014-01-01 DIAGNOSIS — S065X0A Traumatic subdural hemorrhage without loss of consciousness, initial encounter: Secondary | ICD-10-CM | POA: Diagnosis not present

## 2014-01-01 DIAGNOSIS — M47817 Spondylosis without myelopathy or radiculopathy, lumbosacral region: Secondary | ICD-10-CM | POA: Diagnosis not present

## 2014-01-01 DIAGNOSIS — M549 Dorsalgia, unspecified: Secondary | ICD-10-CM

## 2014-01-01 DIAGNOSIS — M5126 Other intervertebral disc displacement, lumbar region: Secondary | ICD-10-CM | POA: Diagnosis not present

## 2014-01-01 DIAGNOSIS — G9340 Encephalopathy, unspecified: Secondary | ICD-10-CM | POA: Diagnosis not present

## 2014-01-01 DIAGNOSIS — J9601 Acute respiratory failure with hypoxia: Secondary | ICD-10-CM | POA: Diagnosis present

## 2014-01-01 DIAGNOSIS — R140 Abdominal distension (gaseous): Secondary | ICD-10-CM

## 2014-01-01 DIAGNOSIS — R51 Headache: Secondary | ICD-10-CM | POA: Diagnosis not present

## 2014-01-01 DIAGNOSIS — S0083XA Contusion of other part of head, initial encounter: Secondary | ICD-10-CM | POA: Diagnosis not present

## 2014-01-01 DIAGNOSIS — R569 Unspecified convulsions: Secondary | ICD-10-CM | POA: Diagnosis present

## 2014-01-01 DIAGNOSIS — S22009A Unspecified fracture of unspecified thoracic vertebra, initial encounter for closed fracture: Secondary | ICD-10-CM | POA: Diagnosis not present

## 2014-01-01 DIAGNOSIS — R069 Unspecified abnormalities of breathing: Secondary | ICD-10-CM | POA: Diagnosis not present

## 2014-01-01 NOTE — ED Notes (Signed)
Bed: WA25 Expected date:  Expected time:  Means of arrival:  Comments: EMS  

## 2014-01-01 NOTE — ED Notes (Signed)
Brought in by EMS from [facility] with c/o fever.  Staff at the facility reported that pt started having fever tonight--- his temp was T100.2 at 1600 and was given Tylenol with little effect: pt's temp went down to T99.5.  Pt was audibly wheezing with a "very bad cough" on EMS' arrival--- pt was given Albuterol 10 mg and Atrovent 0.5 mg per neb tx and Solu-Medrol 125 mg IV en route to ED.  Pt presents to ED still on neb tx, alert but nonverbal; pt is quadraplegic, has PEG tube in place.

## 2014-01-02 ENCOUNTER — Emergency Department (HOSPITAL_COMMUNITY): Payer: Medicare Other

## 2014-01-02 ENCOUNTER — Inpatient Hospital Stay (HOSPITAL_COMMUNITY): Payer: Medicare Other

## 2014-01-02 ENCOUNTER — Encounter (HOSPITAL_COMMUNITY): Payer: Self-pay | Admitting: Internal Medicine

## 2014-01-02 DIAGNOSIS — J9602 Acute respiratory failure with hypercapnia: Secondary | ICD-10-CM | POA: Diagnosis not present

## 2014-01-02 DIAGNOSIS — S065X0A Traumatic subdural hemorrhage without loss of consciousness, initial encounter: Secondary | ICD-10-CM | POA: Diagnosis not present

## 2014-01-02 DIAGNOSIS — S065XAA Traumatic subdural hemorrhage with loss of consciousness status unknown, initial encounter: Secondary | ICD-10-CM

## 2014-01-02 DIAGNOSIS — G9340 Encephalopathy, unspecified: Secondary | ICD-10-CM

## 2014-01-02 DIAGNOSIS — E2740 Unspecified adrenocortical insufficiency: Secondary | ICD-10-CM

## 2014-01-02 DIAGNOSIS — G9341 Metabolic encephalopathy: Secondary | ICD-10-CM | POA: Diagnosis present

## 2014-01-02 DIAGNOSIS — M47817 Spondylosis without myelopathy or radiculopathy, lumbosacral region: Secondary | ICD-10-CM | POA: Diagnosis not present

## 2014-01-02 DIAGNOSIS — D649 Anemia, unspecified: Secondary | ICD-10-CM | POA: Diagnosis present

## 2014-01-02 DIAGNOSIS — R05 Cough: Secondary | ICD-10-CM | POA: Diagnosis not present

## 2014-01-02 DIAGNOSIS — J9811 Atelectasis: Secondary | ICD-10-CM | POA: Diagnosis not present

## 2014-01-02 DIAGNOSIS — Z931 Gastrostomy status: Secondary | ICD-10-CM | POA: Diagnosis not present

## 2014-01-02 DIAGNOSIS — R0602 Shortness of breath: Secondary | ICD-10-CM

## 2014-01-02 DIAGNOSIS — Z4682 Encounter for fitting and adjustment of non-vascular catheter: Secondary | ICD-10-CM | POA: Diagnosis not present

## 2014-01-02 DIAGNOSIS — S065X9A Traumatic subdural hemorrhage with loss of consciousness of unspecified duration, initial encounter: Secondary | ICD-10-CM | POA: Diagnosis present

## 2014-01-02 DIAGNOSIS — J9601 Acute respiratory failure with hypoxia: Secondary | ICD-10-CM | POA: Diagnosis present

## 2014-01-02 DIAGNOSIS — N390 Urinary tract infection, site not specified: Secondary | ICD-10-CM

## 2014-01-02 DIAGNOSIS — N189 Chronic kidney disease, unspecified: Secondary | ICD-10-CM | POA: Diagnosis present

## 2014-01-02 DIAGNOSIS — I6201 Nontraumatic acute subdural hemorrhage: Secondary | ICD-10-CM | POA: Diagnosis not present

## 2014-01-02 DIAGNOSIS — I6200 Nontraumatic subdural hemorrhage, unspecified: Secondary | ICD-10-CM | POA: Diagnosis not present

## 2014-01-02 DIAGNOSIS — R51 Headache: Secondary | ICD-10-CM | POA: Diagnosis not present

## 2014-01-02 DIAGNOSIS — R140 Abdominal distension (gaseous): Secondary | ICD-10-CM | POA: Diagnosis not present

## 2014-01-02 DIAGNOSIS — F329 Major depressive disorder, single episode, unspecified: Secondary | ICD-10-CM | POA: Diagnosis present

## 2014-01-02 DIAGNOSIS — G8250 Quadriplegia, unspecified: Secondary | ICD-10-CM | POA: Diagnosis present

## 2014-01-02 DIAGNOSIS — G20 Parkinson's disease: Secondary | ICD-10-CM | POA: Diagnosis present

## 2014-01-02 DIAGNOSIS — S0083XA Contusion of other part of head, initial encounter: Secondary | ICD-10-CM | POA: Diagnosis not present

## 2014-01-02 DIAGNOSIS — I6203 Nontraumatic chronic subdural hemorrhage: Secondary | ICD-10-CM | POA: Diagnosis present

## 2014-01-02 DIAGNOSIS — R4701 Aphasia: Secondary | ICD-10-CM | POA: Diagnosis present

## 2014-01-02 DIAGNOSIS — S22009A Unspecified fracture of unspecified thoracic vertebra, initial encounter for closed fracture: Secondary | ICD-10-CM | POA: Diagnosis not present

## 2014-01-02 DIAGNOSIS — R569 Unspecified convulsions: Secondary | ICD-10-CM | POA: Diagnosis present

## 2014-01-02 DIAGNOSIS — K5641 Fecal impaction: Secondary | ICD-10-CM | POA: Diagnosis not present

## 2014-01-02 DIAGNOSIS — M5126 Other intervertebral disc displacement, lumbar region: Secondary | ICD-10-CM | POA: Diagnosis not present

## 2014-01-02 DIAGNOSIS — R400 Somnolence: Secondary | ICD-10-CM | POA: Diagnosis not present

## 2014-01-02 HISTORY — DX: Traumatic subdural hemorrhage with loss of consciousness status unknown, initial encounter: S06.5XAA

## 2014-01-02 HISTORY — DX: Encephalopathy, unspecified: G93.40

## 2014-01-02 HISTORY — DX: Traumatic subdural hemorrhage with loss of consciousness of unspecified duration, initial encounter: S06.5X9A

## 2014-01-02 LAB — BLOOD GAS, VENOUS
ACID-BASE EXCESS: 2.7 mmol/L — AB (ref 0.0–2.0)
BICARBONATE: 29.1 meq/L — AB (ref 20.0–24.0)
FIO2: 1 %
O2 Content: 15 L/min
O2 Saturation: 90 %
PO2 VEN: 65.2 mmHg — AB (ref 30.0–45.0)
Patient temperature: 99.4
TCO2: 27.2 mmol/L (ref 0–100)
pCO2, Ven: 58.2 mmHg — ABNORMAL HIGH (ref 45.0–50.0)
pH, Ven: 7.322 — ABNORMAL HIGH (ref 7.250–7.300)

## 2014-01-02 LAB — BLOOD GAS, ARTERIAL
Acid-base deficit: 1.3 mmol/L (ref 0.0–2.0)
Bicarbonate: 24.4 mEq/L — ABNORMAL HIGH (ref 20.0–24.0)
DRAWN BY: 11249
FIO2: 0.4 %
LHR: 18 {breaths}/min
Mode: POSITIVE
O2 Saturation: 99.1 %
PCO2 ART: 49.3 mmHg — AB (ref 35.0–45.0)
PEEP: 5 cmH2O
PH ART: 7.319 — AB (ref 7.350–7.450)
Patient temperature: 99.4
Pressure control: 10 cmH2O
TCO2: 22.8 mmol/L (ref 0–100)
pO2, Arterial: 186 mmHg — ABNORMAL HIGH (ref 80.0–100.0)

## 2014-01-02 LAB — URINE MICROSCOPIC-ADD ON

## 2014-01-02 LAB — COMPREHENSIVE METABOLIC PANEL
ALK PHOS: 121 U/L — AB (ref 39–117)
ALT: 11 U/L (ref 0–53)
ALT: 8 U/L (ref 0–53)
ANION GAP: 15 (ref 5–15)
AST: 33 U/L (ref 0–37)
AST: 23 U/L (ref 0–37)
Albumin: 3.2 g/dL — ABNORMAL LOW (ref 3.5–5.2)
Albumin: 3 g/dL — ABNORMAL LOW (ref 3.5–5.2)
Alkaline Phosphatase: 114 U/L (ref 39–117)
Anion gap: 12 (ref 5–15)
BILIRUBIN TOTAL: 0.4 mg/dL (ref 0.3–1.2)
BUN: 16 mg/dL (ref 6–23)
BUN: 20 mg/dL (ref 6–23)
CHLORIDE: 96 meq/L (ref 96–112)
CO2: 24 mEq/L (ref 19–32)
CO2: 26 meq/L (ref 19–32)
Calcium: 9.2 mg/dL (ref 8.4–10.5)
Calcium: 9.1 mg/dL (ref 8.4–10.5)
Chloride: 102 mEq/L (ref 96–112)
Creatinine, Ser: 0.65 mg/dL (ref 0.50–1.35)
Creatinine, Ser: 0.55 mg/dL (ref 0.50–1.35)
GFR calc Af Amer: >90 mL/min (ref >90)
GFR calc non Af Amer: >90 mL/min (ref >90)
GLUCOSE: 120 mg/dL — AB (ref 70–99)
Glucose, Bld: 163 mg/dL — ABNORMAL HIGH (ref 70–99)
POTASSIUM: 4 meq/L (ref 3.7–5.3)
Potassium: 3.9 mEq/L (ref 3.7–5.3)
Sodium: 137 mEq/L (ref 137–147)
Sodium: 138 mEq/L (ref 137–147)
Total Bilirubin: 0.4 mg/dL (ref 0.3–1.2)
Total Protein: 8.7 g/dL — ABNORMAL HIGH (ref 6.0–8.3)
Total Protein: 8.4 g/dL — ABNORMAL HIGH (ref 6.0–8.3)

## 2014-01-02 LAB — CBC WITH DIFFERENTIAL/PLATELET
BASOS PCT: 0 % (ref 0–1)
Basophils Absolute: 0 10*3/uL (ref 0.0–0.1)
Basophils Absolute: 0 10*3/uL (ref 0.0–0.1)
Basophils Relative: 0 % (ref 0–1)
EOS ABS: 0.3 10*3/uL (ref 0.0–0.7)
EOS PCT: 4 % (ref 0–5)
Eosinophils Absolute: 0 10*3/uL (ref 0.0–0.7)
Eosinophils Relative: 0 % (ref 0–5)
HCT: 29.4 % — ABNORMAL LOW (ref 39.0–52.0)
HEMATOCRIT: 31.2 % — AB (ref 39.0–52.0)
HEMOGLOBIN: 9.9 g/dL — AB (ref 13.0–17.0)
Hemoglobin: 9.3 g/dL — ABNORMAL LOW (ref 13.0–17.0)
LYMPHS PCT: 24 % (ref 12–46)
Lymphocytes Relative: 10 % — ABNORMAL LOW (ref 12–46)
Lymphs Abs: 2 10*3/uL (ref 0.7–4.0)
Lymphs Abs: 0.9 10*3/uL (ref 0.7–4.0)
MCH: 23.6 pg — ABNORMAL LOW (ref 26.0–34.0)
MCH: 24.1 pg — ABNORMAL LOW (ref 26.0–34.0)
MCHC: 31.7 g/dL (ref 30.0–36.0)
MCHC: 31.6 g/dL (ref 30.0–36.0)
MCV: 74.3 fL — ABNORMAL LOW (ref 78.0–100.0)
MCV: 76.2 fL — ABNORMAL LOW (ref 78.0–100.0)
Monocytes Absolute: 0.5 10*3/uL (ref 0.1–1.0)
Monocytes Absolute: 0.5 10*3/uL (ref 0.1–1.0)
Monocytes Relative: 6 % (ref 3–12)
Monocytes Relative: 6 % (ref 3–12)
NEUTROS ABS: 5.6 10*3/uL (ref 1.7–7.7)
Neutro Abs: 7.8 10*3/uL — ABNORMAL HIGH (ref 1.7–7.7)
Neutrophils Relative %: 66 % (ref 43–77)
Neutrophils Relative %: 84 % — ABNORMAL HIGH (ref 43–77)
Platelets: 316 10*3/uL (ref 150–400)
Platelets: 282 10*3/uL (ref 150–400)
RBC: 4.2 MIL/uL — ABNORMAL LOW (ref 4.22–5.81)
RBC: 3.86 MIL/uL — ABNORMAL LOW (ref 4.22–5.81)
RDW: 13.3 % (ref 11.5–15.5)
RDW: 13.5 % (ref 11.5–15.5)
WBC: 8.4 10*3/uL (ref 4.0–10.5)
WBC: 9.3 10*3/uL (ref 4.0–10.5)

## 2014-01-02 LAB — PROTIME-INR
INR: 1.27 (ref 0.00–1.49)
Prothrombin Time: 15.9 seconds — ABNORMAL HIGH (ref 11.6–15.2)

## 2014-01-02 LAB — GLUCOSE, CAPILLARY
GLUCOSE-CAPILLARY: 138 mg/dL — AB (ref 70–99)
GLUCOSE-CAPILLARY: 129 mg/dL — AB (ref 70–99)
GLUCOSE-CAPILLARY: 124 mg/dL — AB (ref 70–99)
Glucose-Capillary: 173 mg/dL — ABNORMAL HIGH (ref 70–99)

## 2014-01-02 LAB — URINALYSIS, ROUTINE W REFLEX MICROSCOPIC
BILIRUBIN URINE: NEGATIVE
Glucose, UA: NEGATIVE mg/dL
KETONES UR: NEGATIVE mg/dL
NITRITE: NEGATIVE
Protein, ur: 100 mg/dL — AB
Specific Gravity, Urine: 1.035 — ABNORMAL HIGH (ref 1.005–1.030)
Urobilinogen, UA: 0.2 mg/dL (ref 0.0–1.0)
pH: 8 (ref 5.0–8.0)

## 2014-01-02 LAB — MRSA PCR SCREENING: MRSA by PCR: NEGATIVE

## 2014-01-02 LAB — PHENYTOIN LEVEL, TOTAL: PHENYTOIN LVL: 15.4 ug/mL (ref 10.0–20.0)

## 2014-01-02 LAB — AMMONIA: Ammonia: 34 umol/L (ref 11–60)

## 2014-01-02 LAB — I-STAT CG4 LACTIC ACID, ED: Lactic Acid, Venous: 1.7 mmol/L (ref 0.5–2.2)

## 2014-01-02 LAB — TSH: TSH: 1.35 u[IU]/mL (ref 0.350–4.500)

## 2014-01-02 MED ORDER — ACETAMINOPHEN 325 MG PO TABS: 650.0000 mg | ORAL_TABLET | Freq: Four times a day (QID) | ORAL | Status: DC | PRN

## 2014-01-02 MED ORDER — HYDROCORTISONE NA SUCCINATE PF 100 MG IJ SOLR
50.0000 mg | Freq: Three times a day (TID) | INTRAMUSCULAR | Status: DC
  Administered 2014-01-02: 50 mg via INTRAVENOUS
  Filled 2014-01-02 (×4): qty 1

## 2014-01-02 MED ORDER — SACCHAROMYCES BOULARDII 250 MG PO CAPS
250.0000 mg | ORAL_CAPSULE | Freq: Two times a day (BID) | ORAL | Status: DC
  Administered 2014-01-02 – 2014-01-06 (×9): 250 mg via ORAL
  Filled 2014-01-02 (×9): qty 1

## 2014-01-02 MED ORDER — DEXTROSE 5 % IV SOLN
2.0000 g | Freq: Once | INTRAVENOUS | Status: AC
  Administered 2014-01-02: 2 g via INTRAVENOUS
  Filled 2014-01-02: qty 2

## 2014-01-02 MED ORDER — CETYLPYRIDINIUM CHLORIDE 0.05 % MT LIQD
7.0000 mL | Freq: Two times a day (BID) | OROMUCOSAL | Status: DC
  Administered 2014-01-02 – 2014-01-05 (×4): 7 mL via OROMUCOSAL

## 2014-01-02 MED ORDER — IPRATROPIUM-ALBUTEROL 0.5-2.5 (3) MG/3ML IN SOLN
3.0000 mL | RESPIRATORY_TRACT | Status: DC
Start: 2014-01-02 — End: 2014-01-02
  Administered 2014-01-02: 3 mL via RESPIRATORY_TRACT
  Filled 2014-01-02 (×2): qty 3

## 2014-01-02 MED ORDER — VANCOMYCIN HCL IN DEXTROSE 1-5 GM/200ML-% IV SOLN
1000.0000 mg | Freq: Two times a day (BID) | INTRAVENOUS | Status: DC
  Filled 2014-01-02: qty 200

## 2014-01-02 MED ORDER — KCL IN DEXTROSE-NACL 40-5-0.45 MEQ/L-%-% IV SOLN
INTRAVENOUS | Status: DC
  Administered 2014-01-02 – 2014-01-03 (×2): via INTRAVENOUS
  Filled 2014-01-02 (×2): qty 1000

## 2014-01-02 MED ORDER — FLUDROCORTISONE ACETATE 0.1 MG PO TABS
0.1000 mg | ORAL_TABLET | Freq: Every evening | ORAL | Status: DC
Start: 2014-01-02 — End: 2014-01-02

## 2014-01-02 MED ORDER — SODIUM CHLORIDE 0.9 % IV SOLN
INTRAVENOUS | Status: DC
  Administered 2014-01-02 (×2): via INTRAVENOUS

## 2014-01-02 MED ORDER — FOLIC ACID 1 MG PO TABS
1.0000 mg | ORAL_TABLET | Freq: Every morning | ORAL | Status: DC
  Administered 2014-01-02 – 2014-01-06 (×5): 1 mg
  Filled 2014-01-02 (×5): qty 1

## 2014-01-02 MED ORDER — DEXTROSE 5 % IV SOLN
2.0000 g | Freq: Two times a day (BID) | INTRAVENOUS | Status: DC
  Filled 2014-01-02 (×3): qty 2

## 2014-01-02 MED ORDER — POLYVINYL ALCOHOL 1.4 % OP SOLN
1.0000 [drp] | Freq: Every day | OPHTHALMIC | Status: DC
Start: 2014-01-02 — End: 2014-01-06
  Administered 2014-01-02 – 2014-01-06 (×5): 1 [drp] via OPHTHALMIC
  Filled 2014-01-02: qty 15

## 2014-01-02 MED ORDER — VANCOMYCIN HCL 10 G IV SOLR
1500.0000 mg | Freq: Once | INTRAVENOUS | Status: AC
  Administered 2014-01-02: 1500 mg via INTRAVENOUS
  Filled 2014-01-02: qty 1500

## 2014-01-02 MED ORDER — OSMOLITE 1.5 CAL PO LIQD
1000.0000 mL | ORAL | Status: DC
  Administered 2014-01-02 – 2014-01-05 (×3): 1000 mL
  Filled 2014-01-02 (×12): qty 1000

## 2014-01-02 MED ORDER — PHENYTOIN 125 MG/5ML PO SUSP
125.0000 mg | Freq: Two times a day (BID) | ORAL | Status: DC
  Administered 2014-01-02 – 2014-01-06 (×9): 125 mg
  Filled 2014-01-02 (×13): qty 5

## 2014-01-02 MED ORDER — FERROUS SULFATE 300 (60 FE) MG/5ML PO SYRP
300.0000 mg | ORAL_SOLUTION | Freq: Every morning | ORAL | Status: DC
Start: 2014-01-02 — End: 2014-01-06
  Administered 2014-01-02 – 2014-01-05 (×4): 300 mg
  Filled 2014-01-02 (×5): qty 5

## 2014-01-02 MED ORDER — ONDANSETRON HCL 4 MG PO TABS: 4.0000 mg | ORAL_TABLET | Freq: Four times a day (QID) | ORAL | Status: DC | PRN

## 2014-01-02 MED ORDER — BACLOFEN 10 MG PO TABS
10.0000 mg | ORAL_TABLET | Freq: Three times a day (TID) | ORAL | Status: DC
Start: 2014-01-02 — End: 2014-01-06
  Administered 2014-01-02 – 2014-01-06 (×13): 10 mg
  Filled 2014-01-02 (×13): qty 1

## 2014-01-02 MED ORDER — ONDANSETRON HCL 4 MG/2ML IJ SOLN
4.0000 mg | Freq: Four times a day (QID) | INTRAMUSCULAR | Status: DC | PRN
  Administered 2014-01-04: 4 mg via INTRAVENOUS
  Filled 2014-01-02: qty 2

## 2014-01-02 MED ORDER — OLOPATADINE HCL 0.1 % OP SOLN
1.0000 [drp] | Freq: Every morning | OPHTHALMIC | Status: DC
  Administered 2014-01-02 – 2014-01-06 (×5): 1 [drp] via OPHTHALMIC
  Filled 2014-01-02: qty 5

## 2014-01-02 MED ORDER — PRO-STAT SUGAR FREE PO LIQD
30.0000 mL | Freq: Three times a day (TID) | ORAL | Status: DC
  Administered 2014-01-02 – 2014-01-06 (×13): 30 mL
  Filled 2014-01-02 (×15): qty 30

## 2014-01-02 MED ORDER — SODIUM CHLORIDE 0.9 % IV BOLUS (SEPSIS)
1000.0000 mL | Freq: Once | INTRAVENOUS | Status: AC
  Administered 2014-01-02: 1000 mL via INTRAVENOUS

## 2014-01-02 MED ORDER — INSULIN ASPART 100 UNIT/ML ~~LOC~~ SOLN
0.0000 [IU] | SUBCUTANEOUS | Status: DC
  Administered 2014-01-02: 3 [IU] via SUBCUTANEOUS
  Administered 2014-01-02 – 2014-01-03 (×5): 2 [IU] via SUBCUTANEOUS

## 2014-01-02 MED ORDER — CHLORHEXIDINE GLUCONATE 0.12 % MT SOLN
15.0000 mL | Freq: Two times a day (BID) | OROMUCOSAL | Status: DC
  Administered 2014-01-02 – 2014-01-06 (×7): 15 mL via OROMUCOSAL
  Filled 2014-01-02 (×7): qty 15

## 2014-01-02 MED ORDER — LORATADINE 10 MG PO TABS
10.0000 mg | ORAL_TABLET | Freq: Every day | ORAL | Status: DC
  Filled 2014-01-02: qty 1

## 2014-01-02 MED ORDER — ACETAMINOPHEN 650 MG RE SUPP: 650.0000 mg | Freq: Four times a day (QID) | RECTAL | Status: DC | PRN

## 2014-01-02 MED ORDER — HYDROCORTISONE NA SUCCINATE PF 100 MG IJ SOLR
50.0000 mg | Freq: Two times a day (BID) | INTRAMUSCULAR | Status: DC
  Administered 2014-01-02 – 2014-01-03 (×2): 50 mg via INTRAVENOUS
  Filled 2014-01-02 (×4): qty 1

## 2014-01-02 MED ORDER — BISACODYL 10 MG RE SUPP: 10.0000 mg | RECTAL | Status: DC | PRN

## 2014-01-02 NOTE — Progress Notes (Signed)
INITIAL NUTRITION ASSESSMENT  DOCUMENTATION CODES Per approved criteria  -Not Applicable   INTERVENTION: Initiate Osmolite 1.5 @ 55 ml/hr via PEG.   30 ml Prostat TID.  At goal rate, tube feeding regimen will provide 1950 kcal, 114 grams of protein, and 922 ml of H2O.   Once IVF's are d/c'ed recommend:  260 ml H2O every 6 hours.   NUTRITION DIAGNOSIS: Inadequate oral intake related to inability to eat as evidenced by NPO status  Goal: Pt to meet >/= 90% of their estimated nutrition needs   Monitor:  TF tolerance, weight trends, labs  Reason for Assessment: Consult received to initiate and manage enteral nutrition support.  58 y.o. male  Admitting Dx: Subdural hematoma  ASSESSMENT: Pt with history of quadriplegia, dysphagia status post peg tube placement, aphasia, depression, seizure disorder, adrenal insufficiency, Parkinson's disease and resides at nursing home. TF regimen is TwoCal HN at 55 ml/hr for 20 hours. This provides 2200 kcal, and 92 gram protein  Pt admitted with AMS, per CT pt with chronic SDH.   Pt on KCl, ferrous sulfate, folic acid, and florastor.   Height: Ht Readings from Last 1 Encounters:  01/02/14 6' 2.02" (1.88 m)    Weight: Wt Readings from Last 1 Encounters:  01/02/14 155 lb 6.8 oz (70.5 kg)    Ideal Body Weight: 77.7 kg   % Ideal Body Weight: 91%  Wt Readings from Last 10 Encounters:  01/02/14 155 lb 6.8 oz (70.5 kg)  12/11/13 155 lb 6.4 oz (70.489 kg)  10/30/13 156 lb 3.2 oz (70.852 kg)  07/23/13 156 lb (70.761 kg)  06/24/13 159 lb 9.6 oz (72.394 kg)  05/27/13 158 lb 9.6 oz (71.94 kg)  06/15/12 150 lb 2.1 oz (68.1 kg)  06/13/12 150 lb (68.04 kg)  11/11/11 151 lb 3.8 oz (68.6 kg)    Usual Body Weight: 150-158 lb   % Usual Body Weight: 100%  BMI:  Body mass index is 19.95 kg/(m^2).  Estimated Nutritional Needs: Kcal: 1900-2100  Protein: 105-115 gram  Fluid: >/=1900 ml/daily  Skin: intact  Diet Order: NPO  EDUCATION  NEEDS: -No education needs identified at this time   Intake/Output Summary (Last 24 hours) at 01/02/14 1056 Last data filed at 01/02/14 1000  Gross per 24 hour  Intake 1407.92 ml  Output    425 ml  Net 982.92 ml    Last BM: PTA   Labs:   Recent Labs Lab 01/02/14 0033 01/02/14 0730  NA 137 138  K 4.0 3.9  CL 96 102  CO2 26 24  BUN 20 16  CREATININE 0.65 0.55  CALCIUM 9.2 9.1  GLUCOSE 120* 163*    CBG (last 3)   Recent Labs  01/02/14 0654  GLUCAP 173*    Scheduled Meds: . baclofen  10 mg Per Tube TID  . ferrous sulfate  300 mg Per Tube q morning - 01U  . folic acid  1 mg Per Tube q morning - 10a  . hydrocortisone sod succinate (SOLU-CORTEF) inj  50 mg Intravenous Q12H  . insulin aspart  0-15 Units Subcutaneous 6 times per day  . olopatadine  1 drop Both Eyes q morning - 10a  . phenytoin  125 mg Per Tube BID  . polyvinyl alcohol  1 drop Both Eyes Daily  . saccharomyces boulardii  250 mg Oral BID    Continuous Infusions: . dextrose 5 % and 0.45 % NaCl with KCl 40 mEq/L    . feeding supplement (OSMOLITE 1.5 CAL)  Past Medical History  Diagnosis Date  . Quadriplegia   . Depression   . Anemia   . Parkinson's disease   . Seizures   . Adrenal insufficiency   . Dysphagia 06/19/2013  . Aphasia 06/19/2013    Past Surgical History  Procedure Laterality Date  . Peg tube placement    . Peripherally inserted central catheter insertion      Bonny Doon, LDN, [place] Pager [phone] After Hours Pager

## 2014-01-02 NOTE — H&P (Signed)
[facility] History and Physical  [name] UJW:[id number] DOB: March 03, 1956 DOA: 01/01/2014  Referring physician: ER physician. PCP: [name], MD   Chief Complaint: Drowsiness.  History obtained from patient's mother.  HPI: [name] is a 58 y.o. male with history of quadriplegia, seizures, chronic anemia, renal insufficiency was brought to the ER patient was found to be increasingly drowsy. Patient's mother states that patient's urine has been increasingly odorous and fell that patient may have had a UTI and patient's primary care physician had ordered a urine study and was placed on antibiotics yesterday despite which patient became more drowsy. Patient was brought to the ER and chest x-ray were showing some nonspecific findings in UA were showing possible UTI. Patient was mildly febrile and drowsy on exam. ABG were showing mild hypercarbia after patient was placed on BiPAP. CT of the head was ordered and were showing acute on chronic subdural hematoma with mass shift. I did discuss with on-call neurosurgeon Dr. Newell Coral who will be seeing patient in consult at [facility]. I have also discussed with on-call critical care Dr.Sood will be seeing patient in consult at [facility]. I have discussed with patient's parents were at the bedside about the transfer patient to [facility].  Review of Systems: As presented in the history of presenting illness, rest negative.  Past Medical History  Diagnosis Date  . Quadriplegia   . Depression   . Anemia   . Parkinson's disease   . Seizures   . Adrenal insufficiency   . Dysphagia 06/19/2013  . Aphasia 06/19/2013   Past Surgical History  Procedure Laterality Date  . Peg tube placement    . Peripherally inserted central catheter insertion     Social History:  reports that he has never smoked. He has never used smokeless tobacco. He reports that he does not drink alcohol or use illicit drugs. Where does patient  live home. Can patient participate in ADLs? No.  Allergies  Allergen Reactions  . Aspirin     unk reaction  . Nsaids     unk reaction  . Penicillins     unk reaction    Family History:  Family History  Problem Relation Age of Onset  . Hypothyroidism Mother   . Hypertension Mother   . Diabetes Father       Prior to Admission medications   Medication Sig Start Date End Date Taking? Authorizing Provider  acetaminophen (TYLENOL) 650 MG suppository Place 650 mg rectally every 4 (four) hours as needed for mild pain.   Yes Historical Provider, MD  baclofen (LIORESAL) 10 MG tablet Give 10 mg by tube 3 (three) times daily.   Yes Historical Provider, MD  bisacodyl (DULCOLAX) 10 MG suppository Place 10 mg rectally as needed for mild constipation or moderate constipation.   Yes Historical Provider, MD  cetirizine (ZYRTEC) 5 MG tablet 5 mg by PEG Tube route every morning.    Yes Historical Provider, MD  ferrous sulfate 220 (44 FE) MG/5ML solution 330 mg by PEG Tube route every morning.    Yes Historical Provider, MD  fludrocortisone (FLORINEF) 0.1 MG tablet 0.1 mg by PEG Tube route every evening. Adrenocortical Insufficiency   Yes Historical Provider, MD  folic acid (FOLVITE) 1 MG tablet 1 mg by PEG Tube route every morning.    Yes Historical Provider, MD  nitrofurantoin, macrocrystal-monohydrate, (MACROBID) 100 MG capsule Take 100 mg by mouth 2 (two) times daily. For 7 days.   Yes Historical Provider, MD  Nutritional Supplements (TWOCAL HN) LIQD Give 1 Can by tube continuous. Run@ 55cc/hr for 20 hrs. Turn off pump 1 hour before and keep off until 1 hour after phenytoin dose   Yes Historical Provider, MD  olopatadine (PATANOL) 0.1 % ophthalmic solution Place 1 drop into both eyes every morning.    Yes Historical Provider, MD  phenytoin (DILANTIN) 125 MG/5ML suspension Place 125 mg into feeding tube 2 (two) times daily.    Yes Historical Provider, MD  polyvinyl alcohol (LIQUIFILM TEARS) 1.4 %  ophthalmic solution Place 1 drop into both eyes daily.    Yes Historical Provider, MD  saccharomyces boulardii (FLORASTOR) 250 MG capsule Give 250 mg by tube 2 (two) times daily.   Yes Historical Provider, MD  Sodium Phosphates (RA SALINE ENEMA RE) Place 1 enema rectally once. If no relief from bisacodyl   Yes Historical Provider, MD  traMADol (ULTRAM) 50 MG tablet Take 50 mg by mouth every 6 (six) hours as needed for moderate pain.   Yes Historical Provider, MD    Physical Exam: Filed Vitals:   01/02/14 0145 01/02/14 0200 01/02/14 0230 01/02/14 0231  BP:  121/70 116/67   Pulse: 88 87 81   Temp:    98.8 F (37.1 C)  TempSrc:    Rectal  Resp:      SpO2: 100% 100% 100%      General:  Moderately built and nourished.  Eyes: Anicteric no pallor. PERRLA positive.  ENT: No discharge from the ears eyes nose mouth.  Neck: No neck rigidity.  Cardiovascular: S1-S2 heard.  Respiratory: No rhonchi or crepitations.  Abdomen: Soft nontender bowel sounds present. PEG tube in place.  Skin: No rash.  Musculoskeletal: Left hand is having contractures.  Psychiatric: Patient is drowsy.  Neurologic: Patient is drowsy but follows commands.  Labs on Admission:  Basic Metabolic Panel:  Recent Labs Lab 01/02/14 0033  NA 137  K 4.0  CL 96  CO2 26  GLUCOSE 120*  BUN 20  CREATININE 0.65  CALCIUM 9.2   Liver Function Tests:  Recent Labs Lab 01/02/14 0033  AST 33  ALT 11  ALKPHOS 121*  BILITOT 0.4  PROT 8.7*  ALBUMIN 3.2*   No results found for this basename: LIPASE, AMYLASE,  in the last 168 hours No results found for this basename: AMMONIA,  in the last 168 hours CBC:  Recent Labs Lab 01/02/14 0033  WBC 8.4  NEUTROABS 5.6  HGB 9.9*  HCT 31.2*  MCV 74.3*  PLT 316   Cardiac Enzymes: No results found for this basename: CKTOTAL, CKMB, CKMBINDEX, TROPONINI,  in the last 168 hours  BNP (last 3 results) No results found for this basename: PROBNP,  in the last 8760  hours CBG: No results found for this basename: GLUCAP,  in the last 168 hours  Radiological Exams on Admission: Dg Chest Port 1 View  01/02/2014   CLINICAL DATA:  Fever beginning yesterday. Shortness of breath and wheezing. Initial encounter.  EXAM: PORTABLE CHEST - 1 VIEW  COMPARISON:  One-view chest 06/20/2013.  FINDINGS: The patient is rotated to the right. The heart size is upper limits of normal. This is exaggerated by low lung volumes. Right peritracheal soft tissue prominence is likely vascular and projectional. Chronic interstitial coarsening is evident. The right hemidiaphragm is further elevated with asymmetric subsegmental atelectasis at the right base.  IMPRESSION: 1. Low lung volumes with new elevation of the right hemidiaphragm. 2. Bibasilar subsegmental atelectasis, right greater than left. 3. Chronic  interstitial coarsening.   Electronically Signed   By: Gennette Pac M.D.   On: 01/02/2014 00:36     Assessment/Plan Principal Problem:   Subdural hematoma Active Problems:   Adrenal insufficiency   UTI (lower urinary tract infection)   Acute encephalopathy   Acute respiratory failure with hypercapnia   Chronic anemia   1. Acute on chronic subdural hematoma with mass shift - I have discussed with on-call neurosurgeon Dr. Newell Coral who will be seeing patient in consult at [facility] ICU. I have also discussed with critical care Dr. Craige Cotta. They'll also be seeing patient in consult. I did discuss with patient's parents about patient's transfer to [facility] and they are in agreement with transfer. Patient will be kept n.p.o. and on BiPAP. Further recommendations per neurosurgery and critical care. 2. Acute encephalopathy with hypercapnic respiratory failure - probably from #1. 3. Possible UTI - patient has been empirically given antibiotics. Follow cultures. 4. Adrenal insufficiency - patient has been placed on stress dose steroids. 5. History of seizures - on Dilantin.  Check Dilantin levels. 6. Chronic anemia - follow CBC. 7. Quadriplegia. 8. Patient is on PEG tube feeds.    Code Status: Full code.  Family Communication: Patient's parents at the bedside.  Disposition Plan: Admit to inpatient.    [name] N. [facility] Pager [phone]. If 7PM-7AM, please contact night-coverage [email or web address] Password [email or web address] 01/02/2014, 4:02 AM

## 2014-01-02 NOTE — Consult Note (Signed)
PULMONARY / CRITICAL CARE MEDICINE   Name: Kennet Alworth MRN: [id number] DOB: 04-30-1955    ADMISSION DATE:  01/01/2014 CONSULTATION DATE:  01/02/2014  REFERRING MD :  Toniann Fail  CHIEF COMPLAINT:  AMS  INITIAL PRESENTATION: 58 y.o. M brought to [facility] ED on 10/8 for increasing drowsiness / AMS.  In ED, CT head revealed acute on chronic SDH with mass effect and MLS.  Neurosurgery was consulted and requested transfer to [facility] and PCCM consult.     STUDIES:  CT Head 10/8 >>> large left sided acute on chronic SDH measuring 1.3cm at level of lateral ventricles, 3cm at vertex.  Scattered foci of acute blood noted within the hematoma.  Marked mass effect with 1.4cm of MLS. CT Lumbar Spine 10/8 >>> no definite discrete fracture.  Stable anterior wedging of T11 and T12.  Degenerative disc bulge with superimposed right foraminal disc protrusion at L4-L5 with resultant mild canal bilateral foraminal stenosis.  SIGNIFICANT EVENTS: 10/8 - presented to [facility] with AMS / increasing drowsiness.  Found to have acute on chronic SDH.  Transferred to [facility].   HISTORY OF PRESENT ILLNESS:  Pt is aphasic; therefore, this HPI is obtained from chart review. Mr. Severs is a 58 y.o. M with PMH as outlined below who was taken to [facility] ED on 10/8 for increased drowsiness / AMS x 2 days.  Mother stated that pt's urine had foul odor to it so took him to PCP 1 day ago who placed pt on abx for UTI.  Pt remained drowsy even after initiation of abx. In ED, pt was found to have mild hypercarbic respiratory failure.  In addition, CT head revealed acute on chronic SDH with marked mass effect and 1.4cm of MLS. Neurosurgery Newell Coral) was consulted and request was made for transfer to [facility] and PCCM consult.  PAST MEDICAL HISTORY :  Past Medical History  Diagnosis Date  . Quadriplegia   . Depression   . Anemia   . Parkinson's disease   . Seizures   . Adrenal insufficiency   . Dysphagia 06/19/2013  . Aphasia 06/19/2013   Past Surgical  History  Procedure Laterality Date  . Peg tube placement    . Peripherally inserted central catheter insertion     Prior to Admission medications   Medication Sig Start Date End Date Taking? Authorizing Provider  acetaminophen (TYLENOL) 650 MG suppository Place 650 mg rectally every 4 (four) hours as needed for mild pain.   Yes Historical Provider, MD  baclofen (LIORESAL) 10 MG tablet Give 10 mg by tube 3 (three) times daily.   Yes Historical Provider, MD  bisacodyl (DULCOLAX) 10 MG suppository Place 10 mg rectally as needed for mild constipation or moderate constipation.   Yes Historical Provider, MD  cetirizine (ZYRTEC) 5 MG tablet 5 mg by PEG Tube route every morning.    Yes Historical Provider, MD  ferrous sulfate 220 (44 FE) MG/5ML solution 330 mg by PEG Tube route every morning.    Yes Historical Provider, MD  fludrocortisone (FLORINEF) 0.1 MG tablet 0.1 mg by PEG Tube route every evening. Adrenocortical Insufficiency   Yes Historical Provider, MD  folic acid (FOLVITE) 1 MG tablet 1 mg by PEG Tube route every morning.    Yes Historical Provider, MD  nitrofurantoin, macrocrystal-monohydrate, (MACROBID) 100 MG capsule Take 100 mg by mouth 2 (two) times daily. For 7 days.   Yes Historical Provider, MD  Nutritional Supplements (TWOCAL HN) LIQD Give 1 Can by tube continuous. Run@ 55cc/hr for 20 hrs.  Turn off pump 1 hour before and keep off until 1 hour after phenytoin dose   Yes Historical Provider, MD  olopatadine (PATANOL) 0.1 % ophthalmic solution Place 1 drop into both eyes every morning.    Yes Historical Provider, MD  phenytoin (DILANTIN) 125 MG/5ML suspension Place 125 mg into feeding tube 2 (two) times daily.    Yes Historical Provider, MD  polyvinyl alcohol (LIQUIFILM TEARS) 1.4 % ophthalmic solution Place 1 drop into both eyes daily.    Yes Historical Provider, MD  saccharomyces boulardii (FLORASTOR) 250 MG capsule Give 250 mg by tube 2 (two) times daily.   Yes Historical Provider, MD   Sodium Phosphates (RA SALINE ENEMA RE) Place 1 enema rectally once. If no relief from bisacodyl   Yes Historical Provider, MD  traMADol (ULTRAM) 50 MG tablet Take 50 mg by mouth every 6 (six) hours as needed for moderate pain.   Yes Historical Provider, MD   Allergies  Allergen Reactions  . Aspirin     unk reaction  . Nsaids     unk reaction  . Penicillins     unk reaction    FAMILY HISTORY:  Family History  Problem Relation Age of Onset  . Hypothyroidism Mother   . Hypertension Mother   . Diabetes Father    SOCIAL HISTORY:  reports that he has never smoked. He has never used smokeless tobacco. He reports that he does not drink alcohol or use illicit drugs.  REVIEW OF SYSTEMS:  Unable to obtain as pt is aphasic  SUBJECTIVE:   VITAL SIGNS: Temp:  [98.2 F (36.8 C)-99.4 F (37.4 C)] 98.2 F (36.8 C) (10/08 0405) Pulse Rate:  [78-90] 90 (10/08 0453) Resp:  [18-21] 21 (10/08 0453) BP: (104-132)/(60-73) 125/69 mmHg (10/08 0453) SpO2:  [100 %] 100 % (10/08 0453) FiO2 (%):  [40 %] 40 % (10/08 0056) Weight:  [70.5 kg (155 lb 6.8 oz)] 70.5 kg (155 lb 6.8 oz) (10/08 0452) HEMODYNAMICS:   VENTILATOR SETTINGS: Vent Mode:  [-] BIPAP FiO2 (%):  [40 %] 40 % Set Rate:  [18 bmp] 18 bmp PEEP:  [5 cmH20] 5 cmH20 INTAKE / OUTPUT: Intake/Output     10/07 0701 - 10/08 0700   I.V. (mL/kg) 1000 (14.2)   Total Intake(mL/kg) 1000 (14.2)   Urine (mL/kg/hr) 200   Total Output 200   Net +800         PHYSICAL EXAMINATION: General: WDWN male, in NAD. Neuro: Aphasic, no focal deficits noted. HEENT: [place]/AT. PERRL, sclerae anicteric. Cardiovascular: RRR, no M/R/G.  Lungs: Respirations even and unlabored.  CTA bilaterally, No W/R/R. Abdomen: BS x 4, soft, NT/ND. PEG in place. Musculoskeletal: Bilateral UE chronic contractures, no edema.  Skin: Intact, warm, no rashes.  LABS:  CBC  Recent Labs Lab 01/02/14 0033  WBC 8.4  HGB 9.9*  HCT 31.2*  PLT 316   Coag's No results  found for this basename: APTT, INR,  in the last 168 hours BMET  Recent Labs Lab 01/02/14 0033  NA 137  K 4.0  CL 96  CO2 26  BUN 20  CREATININE 0.65  GLUCOSE 120*   Electrolytes  Recent Labs Lab 01/02/14 0033  CALCIUM 9.2   Sepsis Markers  Recent Labs Lab 01/02/14 0115  LATICACIDVEN 1.70   ABG  Recent Labs Lab 01/02/14 0210  PHART 7.319*  PCO2ART 49.3*  PO2ART 186.0*   Liver Enzymes  Recent Labs Lab 01/02/14 0033  AST 33  ALT 11  ALKPHOS 121*  BILITOT 0.4  ALBUMIN 3.2*   Cardiac Enzymes No results found for this basename: TROPONINI, PROBNP,  in the last 168 hours Glucose No results found for this basename: GLUCAP,  in the last 168 hours  Imaging Ct Head Wo Contrast  01/02/2014   CLINICAL DATA:  Acute onset of fever. Acute encephalopathy. Personal history of seizures.  EXAM: CT HEAD WITHOUT CONTRAST  TECHNIQUE: Contiguous axial images were obtained from the base of the skull through the vertex without intravenous contrast.  COMPARISON:  CT of the head performed 04/27/2004  FINDINGS: There is a large left-sided acute on chronic subdural hematoma. This measures approximately 1.3 cm in thickness at the level of the lateral ventricles. At the vertex, it measures up to 3 cm, though this reflects the plane of imaging. Scattered foci of acute blood are noted within the hematoma. There is marked associated mass effect, with approximately 1.4 cm of rightward midline shift.  Underlying moderate cortical volume loss is noted. Cerebellar atrophy is seen. Scattered periventricular white matter change likely reflects small vessel ischemic microangiopathy. Chronic ischemic change is noted with regard to the external capsule bilaterally.  The brainstem and fourth ventricle are within normal limits.  There is no evidence of fracture; visualized osseous structures are unremarkable in appearance. The visualized portions of the orbits are within normal limits. The paranasal sinuses  and mastoid air cells are well-aerated. No significant soft tissue abnormalities are seen.  IMPRESSION: 1. Large left-sided acute on chronic subdural hematoma, measuring 1.3 cm in thickness at the level of the lateral ventricles. It measures up to 3 cm at the vertex, though this reflects the plane of imaging. Scattered foci of acute blood noted within the hematoma. Marked associated mass effect, with 1.4 cm of rightward midline shift. 2. Moderate cortical volume loss and scattered small vessel ischemic microangiopathy. Chronic ischemic change noted with regard to the external capsule bilaterally.  Critical Value/emergent results were called by telephone at the time of interpretation on 01/02/2014 at 3:57 am to Dr. Midge Minium, who verbally acknowledged these results.   Electronically Signed   By: Roanna Raider M.D.   On: 01/02/2014 04:00   Ct Lumbar Spine Wo Contrast  01/02/2014   CLINICAL DATA:  Back pain.  No injury.  History of quadriplegia.  EXAM: CT LUMBAR SPINE WITHOUT CONTRAST  TECHNIQUE: Multidetector CT imaging of the lumbar spine was performed without intravenous contrast administration. Multiplanar CT image reconstructions were also generated.  COMPARISON:  Prior CT from 06/22/2013  FINDINGS: Vertebral bodies are normally aligned with preservation of the normal lumbar lordosis. Osteopenia noted.  Chronic anterior height loss at the superior endplate of T12 with associated degenerative endplate Schmorl's node is stable from prior study. Minimal height loss at T11 also unchanged. There is increased central height loss at the superior endplate of L5 as compared to prior CT from 06/22/2013. No discrete fracture line is seen. Otherwise, vertebral body heights are preserved.  Vertebral bodies are normally aligned with preservation of the normal lumbar lordosis.  The conus medullaris terminates at a normal level, and is grossly unremarkable.  Fatty atrophy noted within the paraspinous musculature. No  acute soft tissue abnormality.  At T10-11 through L1-2, there is no disc bulge or disc protrusion. No significant canal or foraminal stenosis.  L2-3: There is mild disc bulge with bilateral facet hypertrophy. No focal disc protrusion. No canal or foraminal stenosis.  L3-4: There is mild diffuse disc bulge with bilateral facet hypertrophy and ligamentum flavum thickening. No  significant canal or foraminal stenosis. No focal disc protrusion.  L4-5: There is diffuse disc bulging. There is a superimposed right foraminal disc protrusion without definite nerve root impingement (series 3, image 109). Bilateral facet hypertrophy with ligamentum flavum thickening present. There is resultant mild canal stenosis. Mild bilateral foraminal narrowing.  L5-S1 mild bilateral facet hypertrophy with mild diffuse disc bulge. No canal or foraminal stenosis.  IMPRESSION: 1. Increased central height loss at the superior endplate of L5 as compared to most recent CT from 11/13/2013. No definite discrete fracture line identified. Correlation with MRI could be performed if there is clinical concern for possible occult compression fracture at this level. 2. Stable anterior wedging of the T11 and T12 vertebral bodies. 3. Degenerative disc bulge with superimposed right foraminal disc protrusion at L4-5 with resultant mild canal bilateral foraminal stenosis. 4. Additional mild degenerative changes as above.   Electronically Signed   By: Rise Mu M.D.   On: 01/02/2014 04:24   Dg Chest Port 1 View  01/02/2014   CLINICAL DATA:  Fever beginning yesterday. Shortness of breath and wheezing. Initial encounter.  EXAM: PORTABLE CHEST - 1 VIEW  COMPARISON:  One-view chest 06/20/2013.  FINDINGS: The patient is rotated to the right. The heart size is upper limits of normal. This is exaggerated by low lung volumes. Right peritracheal soft tissue prominence is likely vascular and projectional. Chronic interstitial coarsening is evident. The  right hemidiaphragm is further elevated with asymmetric subsegmental atelectasis at the right base.  IMPRESSION: 1. Low lung volumes with new elevation of the right hemidiaphragm. 2. Bibasilar subsegmental atelectasis, right greater than left. 3. Chronic interstitial coarsening.   Electronically Signed   By: Gennette Pac M.D.   On: 01/02/2014 00:36     ASSESSMENT / PLAN:  PULMONARY A: Acute hypercarbic respiratory failure - resolved s/p BiPAP. Atelectasis P:   Pulmonary hygiene. BiPAP PRN. CXR in AM.  CARDIOVASCULAR A:  No acute issues P:  Monitor hemodynamics.  RENAL A:   No acute issues P:   NS @ 100. Follow BMP.  GASTROINTESTINAL A:   PEG tube status P:   NPO. Nutrition consult for TF's.  HEMATOLOGIC A:   Chronic anemia VTE Prophylaxis P:  SCD's only. Follow CBC.  INFECTIOUS A:   UTI P:   BCx2 10/8 >>> UCx 10/8 >>> Abx: Cefepime, start date 10/8, day 1/x.  ENDOCRINE A:   Hx Adrenal Insufficiency  P:   Stress steroids. Hold outpatient florinef. Check TSH. SSI.  NEUROLOGIC A:   Acute on chronic SDH - with marked mass effect and 1.4 MLS. Acute metabolic encephalopathy - due to SDH + hypercarbic respiratory failure Quadriplegia Parkinson's Depression Seizures P:   Management per neurosurgery. Continue outpatient Dilantin, folic acid, baclofen.   TODAY'S SUMMARY: 58 y.o. M with acute on chronic SDH with mass effect and MLS.  Neurosurgery consult pending.   Rutherford Guys, PA - C [facility] Pgr: [phone]  or [phone] 01/02/2014, 5:27 AM  Attending:  I have seen and examined the patient with nurse practitioner/resident and agree with the note above.   Seen and examined this morning. He seems to be awake and alert. We will restart his home tube feeding regimen. We will closely monitor him in the ICU with neurosurgery. At this time does not seem that he needs immediate surgery. Will narrow  antibiotic coverage for a UTI to cefepime and then narrow again tomorrow if he remains stable.  BIPAP can come off at this point, but will watch respiratory status closely.  I have personally obtained a history, examined the patient, evaluated laboratory and imaging results, formulated the assessment and plan and placed orders. CRITICAL CARE: The patient is critically ill with multiple organ systems failure and requires high complexity decision making for assessment and support, frequent evaluation and titration of therapies, application of advanced monitoring technologies and extensive interpretation of multiple databases. Critical Care Time devoted to patient care services described in this note is 35 minutes.   Heber Emison, MD [place] [facility] Pager: [phone] Cell: [phone] If no response, call [phone]

## 2014-01-02 NOTE — Progress Notes (Addendum)
ANTIBIOTIC CONSULT NOTE - INITIAL  Pharmacy Consult for Cefepime  Indication: Bacteremia (urinary source?)  Allergies  Allergen Reactions  . Aspirin     unk reaction  . Nsaids     unk reaction  . Penicillins     unk reaction    Patient Measurements: Height: 6' 2.02" (188 cm) Weight: 155 lb 6.8 oz (70.5 kg) IBW/kg (Calculated) : 82.24   Vital Signs: Temp: 98.2 F (36.8 C) (10/08 0405) Temp Source: Axillary (10/08 0405) BP: 125/69 mmHg (10/08 0453) Pulse Rate: 90 (10/08 0453) Intake/Output from previous day: 10/07 0701 - 10/08 0700 In: 1000 [I.V.:1000] Out: 200 [Urine:200] Intake/Output from this shift: Total I/O In: 1000 [I.V.:1000] Out: 200 [Urine:200]  Labs:  Recent Labs  01/02/14 0033  WBC 8.4  HGB 9.9*  PLT 316  CREATININE 0.65   Estimated Creatinine Clearance: 101.6 ml/min (by C-G formula based on Cr of 0.65). No results found for this basename: VANCOTROUGH, VANCOPEAK, VANCORANDOM, GENTTROUGH, GENTPEAK, GENTRANDOM, TOBRATROUGH, TOBRAPEAK, TOBRARND, AMIKACINPEAK, AMIKACINTROU, AMIKACIN,  in the last 72 hours   Microbiology: No results found for this or any previous visit (from the past 720 hour(s)).  Medical History: Past Medical History  Diagnosis Date  . Quadriplegia   . Depression   . Anemia   . Parkinson's disease   . Seizures   . Adrenal insufficiency   . Dysphagia 06/19/2013  . Aphasia 06/19/2013    Medications:  Scheduled:  . ceFEPime (MAXIPIME) IV  2 g Intravenous Q12H  . ipratropium-albuterol  3 mL Nebulization Q4H   Infusions:   Assessment: 21 yoM admitted with increased drowsiness.  Cefepime per Rx for bacteremia probable urinary source.   Goal of Therapy:  Treat infection  Plan:   Cefepime 2Gm IV q12h  F/U SCr/cultures as needed  Lawana Pai R 01/02/2014,5:18 AM   5:44 AM add vancomycin        vanc 1gm q12h   Fu/van trough at 4th dose  Curlene Dolphin  5:48 AM Correction. vanc is now cancelled.

## 2014-01-02 NOTE — Progress Notes (Signed)
Pt has a subdural hematoma as found on CT scan of the head and being transferred to [facility] for proper neurological care.  Pt's PaCO2 on BiPAP was 49; [facility] transport impending.  Pt not placed back on BiPAP at this time.  Pt was responsive and alert.  Report given to the neuro RT at [facility].

## 2014-01-02 NOTE — Progress Notes (Signed)
UR completed.  Ewing Fandino, RN BSN [facility] CCM Trauma/Neuro ICU Case Manager [phone]  

## 2014-01-02 NOTE — Consult Note (Signed)
Reason for Consult:  Subdural hematoma Referring Physician:  Dr. Gean Birchwood (triad hospitalist at [facility])  [name] is an 58 y.o. black male.  HPI: Patient due to pre-existing deficits is unable to provide any history. No family is present. Limited historical information was provided by the referring physician and the nursing staff. Patient currently is a resident at [facility] and has a history of aphasia and spastic quadriparesis. He has a PEG in place, and currently has a condom catheter. He apparently was brought to [facility] ER because of drowsiness and increasingly odorous urine. Urinalysis showed 3-6 WBC and many bacteria. CT of the head showed a chronic left hemispheric subdural hematoma with mild mass effect and shift.  Dr. Hal Hope admitted the patient, and subsequently arranged for transfer of the patient to the [facility] ICU, on the CCM service. Neurosurgical consultation was requested.  Past Medical History:  Past Medical History  Diagnosis Date  . Quadriplegia   . Depression   . Anemia   . Parkinson's disease   . Seizures   . Adrenal insufficiency   . Dysphagia 06/19/2013  . Aphasia 06/19/2013    Past Surgical History:  Past Surgical History  Procedure Laterality Date  . Peg tube placement    . Peripherally inserted central catheter insertion      Family History:  Family History  Problem Relation Age of Onset  . Hypothyroidism Mother   . Hypertension Mother   . Diabetes Father     Social History:  reports that he has never smoked. He has never used smokeless tobacco. He reports that he does not drink alcohol or use illicit drugs.  Allergies:  Allergies  Allergen Reactions  . Aspirin     unk reaction  . Nsaids     unk reaction  . Penicillins     unk reaction    Medications: I have reviewed the patient's current medications.  ROS:  Unobtainable due to the patient's aphasia.  Physical Examination: Middle-aged male  with spastic contractures of extremities, in no acute distress. Blood pressure 125/69, pulse 90, temperature 98.2 F (36.8 C), temperature source Axillary, resp. rate 21, height 6' 2.02" (1.88 m), weight 70.5 kg (155 lb 6.8 oz), SpO2 100.00%.  Neurological Examination: Mental Status Examination:  Awake and alert, seems to follow some simple commands. No speech. Cranial Nerve Examination:  Pupils 3 mm bilaterally, equal, round, reactive to light. Face is grossly symmetrical. Motor Examination:  Weak movement of all 4 extremities, with spastic contractures of all 4 extremities. Sensory Examination:  Seems to sense touch in all 4 extremities. Reflex Examination:   Limited due to spasticity. Gait and Stance Examination:  Not testable.   Results for orders placed during the hospital encounter of 01/01/14 (from the past 48 hour(s))  BLOOD GAS, VENOUS     Status: Abnormal   Collection Time    01/02/14 12:13 AM      Result Value Ref Range   FIO2 1.00     O2 Content 15.0     Delivery systems NON-REBREATHER OXYGEN MASK     pH, Ven 7.322 (*) 7.250 - 7.300   pCO2, Ven 58.2 (*) 45.0 - 50.0 mmHg   pO2, Ven 65.2 (*) 30.0 - 45.0 mmHg   Bicarbonate 29.1 (*) 20.0 - 24.0 mEq/L   TCO2 27.2  0 - 100 mmol/L   Acid-Base Excess 2.7 (*) 0.0 - 2.0 mmol/L   O2 Saturation 90.0     Patient temperature 99.4  Collection site VEIN     Drawn by COLLECTED BY NURSE     Sample type VEIN    CBC WITH DIFFERENTIAL     Status: Abnormal   Collection Time    01/02/14 12:33 AM      Result Value Ref Range   WBC 8.4  4.0 - 10.5 K/uL   RBC 4.20 (*) 4.22 - 5.81 MIL/uL   Hemoglobin 9.9 (*) 13.0 - 17.0 g/dL   HCT 31.2 (*) 39.0 - 52.0 %   MCV 74.3 (*) 78.0 - 100.0 fL   MCH 23.6 (*) 26.0 - 34.0 pg   MCHC 31.7  30.0 - 36.0 g/dL   RDW 13.3  11.5 - 15.5 %   Platelets 316  150 - 400 K/uL   Neutrophils Relative % 66  43 - 77 %   Lymphocytes Relative 24  12 - 46 %   Monocytes Relative 6  3 - 12 %   Eosinophils Relative 4   0 - 5 %   Basophils Relative 0  0 - 1 %   Neutro Abs 5.6  1.7 - 7.7 K/uL   Lymphs Abs 2.0  0.7 - 4.0 K/uL   Monocytes Absolute 0.5  0.1 - 1.0 K/uL   Eosinophils Absolute 0.3  0.0 - 0.7 K/uL   Basophils Absolute 0.0  0.0 - 0.1 K/uL   RBC Morphology POLYCHROMASIA PRESENT     Comment: ELLIPTOCYTES     SPHEROCYTES  COMPREHENSIVE METABOLIC PANEL     Status: Abnormal   Collection Time    01/02/14 12:33 AM      Result Value Ref Range   Sodium 137  137 - 147 mEq/L   Potassium 4.0  3.7 - 5.3 mEq/L   Chloride 96  96 - 112 mEq/L   CO2 26  19 - 32 mEq/L   Glucose, Bld 120 (*) 70 - 99 mg/dL   BUN 20  6 - 23 mg/dL   Creatinine, Ser 0.65  0.50 - 1.35 mg/dL   Calcium 9.2  8.4 - 10.5 mg/dL   Total Protein 8.7 (*) 6.0 - 8.3 g/dL   Albumin 3.2 (*) 3.5 - 5.2 g/dL   AST 33  0 - 37 U/L   Comment: SLIGHT HEMOLYSIS     HEMOLYSIS AT THIS LEVEL MAY AFFECT RESULT   ALT 11  0 - 53 U/L   Alkaline Phosphatase 121 (*) 39 - 117 U/L   Total Bilirubin 0.4  0.3 - 1.2 mg/dL   GFR calc non Af Amer >90  >90 mL/min   GFR calc Af Amer >90  >90 mL/min   Comment: (NOTE)     The eGFR has been calculated using the CKD EPI equation.     This calculation has not been validated in all clinical situations.     eGFR's persistently <90 mL/min signify possible Chronic Kidney     Disease.   Anion gap 15  5 - 15  URINALYSIS, ROUTINE W REFLEX MICROSCOPIC     Status: Abnormal   Collection Time    01/02/14 12:35 AM      Result Value Ref Range   Color, Urine AMBER (*) YELLOW   Comment: BIOCHEMICALS MAY BE AFFECTED BY COLOR   APPearance CLOUDY (*) CLEAR   Specific Gravity, Urine 1.035 (*) 1.005 - 1.030   pH 8.0  5.0 - 8.0   Glucose, UA NEGATIVE  NEGATIVE mg/dL   Hgb urine dipstick MODERATE (*) NEGATIVE   Bilirubin Urine NEGATIVE  NEGATIVE  Ketones, ur NEGATIVE  NEGATIVE mg/dL   Protein, ur 770 (*) NEGATIVE mg/dL   Urobilinogen, UA 0.2  0.0 - 1.0 mg/dL   Nitrite NEGATIVE  NEGATIVE   Leukocytes, UA SMALL (*) NEGATIVE   URINE MICROSCOPIC-ADD ON     Status: Abnormal   Collection Time    01/02/14 12:35 AM      Result Value Ref Range   Squamous Epithelial / LPF RARE  RARE   WBC, UA 3-6  <3 WBC/hpf   RBC / HPF 7-10  <3 RBC/hpf   Bacteria, UA MANY (*) RARE   Urine-Other MUCOUS PRESENT    I-STAT CG4 LACTIC ACID, ED     Status: None   Collection Time    01/02/14  1:15 AM      Result Value Ref Range   Lactic Acid, Venous 1.70  0.5 - 2.2 mmol/L  BLOOD GAS, ARTERIAL     Status: Abnormal   Collection Time    01/02/14  2:10 AM      Result Value Ref Range   FIO2 0.40     Delivery systems VENTILATOR     Comment: SERVO I PRESSURE CONTROL BIPAP   Mode BILEVEL POSITIVE AIRWAY PRESSURE     Comment: PRESSURE CONTROL   Rate 18     Peep/cpap 5.0     Pressure control 10     Comment: ABOVE PEEP   pH, Arterial 7.319 (*) 7.350 - 7.450   pCO2 arterial 49.3 (*) 35.0 - 45.0 mmHg   pO2, Arterial 186.0 (*) 80.0 - 100.0 mmHg   Bicarbonate 24.4 (*) 20.0 - 24.0 mEq/L   TCO2 22.8  0 - 100 mmol/L   Acid-base deficit 1.3  0.0 - 2.0 mmol/L   O2 Saturation 99.1     Patient temperature 99.4     Collection site RIGHT RADIAL     Drawn by [id number]     Sample type ARTERIAL DRAW     Allens test (pass/fail) PASS  PASS  MRSA PCR SCREENING     Status: None   Collection Time    01/02/14  3:59 AM      Result Value Ref Range   MRSA by PCR NEGATIVE  NEGATIVE   Comment:            The GeneXpert MRSA Assay (FDA     approved for NASAL specimens     only), is one component of a     comprehensive MRSA colonization     surveillance program. It is not     intended to diagnose MRSA     infection nor to guide or     monitor treatment for     MRSA infections.    Ct Head Wo Contrast  01/02/2014   CLINICAL DATA:  Acute onset of fever. Acute encephalopathy. Personal history of seizures.  EXAM: CT HEAD WITHOUT CONTRAST  TECHNIQUE: Contiguous axial images were obtained from the base of the skull through the vertex without intravenous  contrast.  COMPARISON:  CT of the head performed 04/27/2004  FINDINGS: There is a large left-sided acute on chronic subdural hematoma. This measures approximately 1.3 cm in thickness at the level of the lateral ventricles. At the vertex, it measures up to 3 cm, though this reflects the plane of imaging. Scattered foci of acute blood are noted within the hematoma. There is marked associated mass effect, with approximately 1.4 cm of rightward midline shift.  Underlying moderate cortical volume loss is noted. Cerebellar atrophy is seen. Scattered  periventricular white matter change likely reflects small vessel ischemic microangiopathy. Chronic ischemic change is noted with regard to the external capsule bilaterally.  The brainstem and fourth ventricle are within normal limits.  There is no evidence of fracture; visualized osseous structures are unremarkable in appearance. The visualized portions of the orbits are within normal limits. The paranasal sinuses and mastoid air cells are well-aerated. No significant soft tissue abnormalities are seen.  IMPRESSION: 1. Large left-sided acute on chronic subdural hematoma, measuring 1.3 cm in thickness at the level of the lateral ventricles. It measures up to 3 cm at the vertex, though this reflects the plane of imaging. Scattered foci of acute blood noted within the hematoma. Marked associated mass effect, with 1.4 cm of rightward midline shift. 2. Moderate cortical volume loss and scattered small vessel ischemic microangiopathy. Chronic ischemic change noted with regard to the external capsule bilaterally.  Critical Value/emergent results were called by telephone at the time of interpretation on 01/02/2014 at 3:57 am to Dr. Gean Birchwood, who verbally acknowledged these results.   Electronically Signed   By: Garald Balding M.D.   On: 01/02/2014 04:00   Ct Lumbar Spine Wo Contrast  01/02/2014   CLINICAL DATA:  Back pain.  No injury.  History of quadriplegia.  EXAM: CT  LUMBAR SPINE WITHOUT CONTRAST  TECHNIQUE: Multidetector CT imaging of the lumbar spine was performed without intravenous contrast administration. Multiplanar CT image reconstructions were also generated.  COMPARISON:  Prior CT from 06/22/2013  FINDINGS: Vertebral bodies are normally aligned with preservation of the normal lumbar lordosis. Osteopenia noted.  Chronic anterior height loss at the superior endplate of G31 with associated degenerative endplate Schmorl's node is stable from prior study. Minimal height loss at T11 also unchanged. There is increased central height loss at the superior endplate of L5 as compared to prior CT from 06/22/2013. No discrete fracture line is seen. Otherwise, vertebral body heights are preserved.  Vertebral bodies are normally aligned with preservation of the normal lumbar lordosis.  The conus medullaris terminates at a normal level, and is grossly unremarkable.  Fatty atrophy noted within the paraspinous musculature. No acute soft tissue abnormality.  At T10-11 through L1-2, there is no disc bulge or disc protrusion. No significant canal or foraminal stenosis.  L2-3: There is mild disc bulge with bilateral facet hypertrophy. No focal disc protrusion. No canal or foraminal stenosis.  L3-4: There is mild diffuse disc bulge with bilateral facet hypertrophy and ligamentum flavum thickening. No significant canal or foraminal stenosis. No focal disc protrusion.  L4-5: There is diffuse disc bulging. There is a superimposed right foraminal disc protrusion without definite nerve root impingement (series 3, image 109). Bilateral facet hypertrophy with ligamentum flavum thickening present. There is resultant mild canal stenosis. Mild bilateral foraminal narrowing.  L5-S1 mild bilateral facet hypertrophy with mild diffuse disc bulge. No canal or foraminal stenosis.  IMPRESSION: 1. Increased central height loss at the superior endplate of L5 as compared to most recent CT from 11/13/2013. No  definite discrete fracture line identified. Correlation with MRI could be performed if there is clinical concern for possible occult compression fracture at this level. 2. Stable anterior wedging of the T11 and T12 vertebral bodies. 3. Degenerative disc bulge with superimposed right foraminal disc protrusion at L4-5 with resultant mild canal bilateral foraminal stenosis. 4. Additional mild degenerative changes as above.   Electronically Signed   By: Jeannine Boga M.D.   On: 01/02/2014 04:24   Dg Chest [facility] [address]  01/02/2014   CLINICAL DATA:  Fever beginning yesterday. Shortness of breath and wheezing. Initial encounter.  EXAM: PORTABLE CHEST - 1 VIEW  COMPARISON:  One-view chest 06/20/2013.  FINDINGS: The patient is rotated to the right. The heart size is upper limits of normal. This is exaggerated by low lung volumes. Right peritracheal soft tissue prominence is likely vascular and projectional. Chronic interstitial coarsening is evident. The right hemidiaphragm is further elevated with asymmetric subsegmental atelectasis at the right base.  IMPRESSION: 1. Low lung volumes with new elevation of the right hemidiaphragm. 2. Bibasilar subsegmental atelectasis, right greater than left. 3. Chronic interstitial coarsening.   Electronically Signed   By: Gennette Pac M.D.   On: 01/02/2014 00:36     Assessment/Plan: Patient with significant pre-existing deficits, who was brought to [facility] because of a UTI and drowsiness. Workup included a CT which revealed evidence of a left hemispheric chronic subdural hematoma. Neurosurgical consultation was requested.  Current examination reveals patient is awake alert, and has a spastic quadriparesis. CT is of some limited quality due to the patient being tilted within the gantry.  I favor monitoring the patient, allowing his UTI to be treated, his nutritional support to be resumed, and repeating a CT of the brain without contrast in the a.m. Ultimately he may  require craniotomy for evacuation of the subdural hematoma, but feel the best to monitor him and assess an updated CT.  I discussed the case and reviewed the CT with Dr. Heber [place] from PCCM.  [name] Shorts, MD 01/02/2014, 5:45 AM   Subsequent note:  Patient's family arrived and provided some additional history. He apparently presented in November 2002 to [facility] with a viral infection and diarrhea. He apparently had significant hyponatremia and hypokalemia. He had a long complicated hospitalization, details of which are not available, but which they explained was complicated by a coma for a month and a half and associated seizure activity. Subsequently he had aphasia and quadriparesis.  He was eventually transferred to care in a skilled nursing facility, and most recently has been in [facility] skilled nursing facility for the past 5 years. They felt that he has changed over the past month with some decreased motor dexterity and more recently has been having odorous urine and was somewhat sleeping more. The physician covering the SNF initiated treated with Macrobid the day prior to being evaluated in the emergency room. He has since been given antibiotic therapy.  I spoke with the patient's parents at length, and reviewed the CT images with them. We discussed the nature of the chronic subdural hematoma, and the possible consideration of craniotomy for evacuation of subdural hematoma. However we also discussed risks of surgery including risks of infection, bleeding, recurrent subdural hematoma requiring reoperation, increased neurologic deficit including paralysis, coma, and death, and anesthetic risks of myocardial infarction, stroke, pneumonia, and death. I explained to them that I favor supportive care for now, including treatment of his UTI and resumption of his nutritional support, monitoring his neurologic function, and obtaining an updated CT of the head without contrast  in the morning. Ultimately if he develops increased neurologic deficit or the subdural collection enlarges we will accept the risks of surgery to proceed with such. On the other hand we may be able to monitor this with serial CTs, and there is a chance that this may resolve gradually over a period of 3-6 months.  Their questions regarding his condition and our plans for treatment and care were  answered for them, and I will be with them tomorrow morning to review his condition, and review the updated CT with them.  [name] Spangle, MD 01/02/14, 6:41 AM

## 2014-01-02 NOTE — Progress Notes (Signed)
RT placed patient on BIPAP 10 (above peep) /5 , rate of 18, and 40% on Servo i  Per MD request. Venous gas done with a CO2 of 56.9. RT will continue to monitor.

## 2014-01-02 NOTE — ED Provider Notes (Signed)
CSN: [id number]     Arrival date & time 01/01/14  2310 History   First MD Initiated Contact with Patient 01/01/14 2334     Chief Complaint  Patient presents with  . Fever     (Consider location/radiation/quality/duration/timing/severity/associated sxs/prior Treatment) HPI [name] is a 58 y.o. male with past medical history of Parkinson's disease, adrenal insufficiency, quadriplegia, coming in with altered mental status. Patient is nonverbal at baseline. Per family in the room, they state the patient has had fever for the past 2 weeks. He has been battling a urinary tract infection and is currently on Macrobid. They state his fevers as high as 101.5 at the nursing facility. Family when the patient transferred here for further evaluation. They also note that he is more tired than normal and not interacting he normally does.     Past Medical History  Diagnosis Date  . Quadriplegia   . Depression   . Anemia   . Parkinson's disease   . Seizures   . Adrenal insufficiency   . Dysphagia 06/19/2013  . Aphasia 06/19/2013   Past Surgical History  Procedure Laterality Date  . Peg tube placement    . Peripherally inserted central catheter insertion     Family History  Problem Relation Age of Onset  . Hypothyroidism Mother   . Hypertension Mother   . Diabetes Father    History  Substance Use Topics  . Smoking status: Never Smoker   . Smokeless tobacco: Never Used  . Alcohol Use: No    Review of Systems  Unable to perform ROS: Patient nonverbal      Allergies  Aspirin; Nsaids; and Penicillins  Home Medications   Prior to Admission medications   Medication Sig Start Date End Date Taking? Authorizing Provider  acetaminophen (TYLENOL) 650 MG suppository Place 650 mg rectally every 4 (four) hours as needed for mild pain.   Yes Historical Provider, MD  baclofen (LIORESAL) 10 MG tablet Give 10 mg by tube 3 (three) times daily.   Yes Historical Provider, MD  bisacodyl  (DULCOLAX) 10 MG suppository Place 10 mg rectally as needed for mild constipation or moderate constipation.   Yes Historical Provider, MD  cetirizine (ZYRTEC) 5 MG tablet 5 mg by PEG Tube route every morning.    Yes Historical Provider, MD  ferrous sulfate 220 (44 FE) MG/5ML solution 330 mg by PEG Tube route every morning.    Yes Historical Provider, MD  fludrocortisone (FLORINEF) 0.1 MG tablet 0.1 mg by PEG Tube route every evening. Adrenocortical Insufficiency   Yes Historical Provider, MD  folic acid (FOLVITE) 1 MG tablet 1 mg by PEG Tube route every morning.    Yes Historical Provider, MD  nitrofurantoin, macrocrystal-monohydrate, (MACROBID) 100 MG capsule Take 100 mg by mouth 2 (two) times daily. For 7 days.   Yes Historical Provider, MD  Nutritional Supplements (TWOCAL HN) LIQD Give 1 Can by tube continuous. Run@ 55cc/hr for 20 hrs. Turn off pump 1 hour before and keep off until 1 hour after phenytoin dose   Yes Historical Provider, MD  olopatadine (PATANOL) 0.1 % ophthalmic solution Place 1 drop into both eyes every morning.    Yes Historical Provider, MD  phenytoin (DILANTIN) 125 MG/5ML suspension Place 125 mg into feeding tube 2 (two) times daily.    Yes Historical Provider, MD  polyvinyl alcohol (LIQUIFILM TEARS) 1.4 % ophthalmic solution Place 1 drop into both eyes daily.    Yes Historical Provider, MD  saccharomyces boulardii (FLORASTOR) 250 MG  capsule Give 250 mg by tube 2 (two) times daily.   Yes Historical Provider, MD  Sodium Phosphates (RA SALINE ENEMA RE) Place 1 enema rectally once. If no relief from bisacodyl   Yes Historical Provider, MD  traMADol (ULTRAM) 50 MG tablet Take 50 mg by mouth every 6 (six) hours as needed for moderate pain.   Yes Historical Provider, MD   BP 116/73  Pulse 82  Temp(Src) 99.4 F (37.4 C) (Rectal)  Resp 18  SpO2 100% Physical Exam  Nursing note and vitals reviewed. Constitutional: Vital signs are normal. He appears well-developed and  well-nourished.  Non-toxic appearance. He does not appear ill. No distress.  Drowsy but arousable.  HENT:  Head: Normocephalic and atraumatic.  Nose: Nose normal.  Mouth/Throat: Oropharynx is clear and moist. No oropharyngeal exudate.  Eyes: Conjunctivae and EOM are normal. Pupils are equal, round, and reactive to light. No scleral icterus.  Pupils are 2 mm bilaterally and reactive  Neck: Normal range of motion. Neck supple. No tracheal deviation, no edema, no erythema and normal range of motion present. No mass and no thyromegaly present.  Cardiovascular: Normal rate, regular rhythm, S1 normal, S2 normal, normal heart sounds, intact distal pulses and normal pulses.  Exam reveals no gallop and no friction rub.   No murmur heard. Pulses:      Radial pulses are 2+ on the right side, and 2+ on the left side.       Dorsalis pedis pulses are 2+ on the right side, and 2+ on the left side.  Pulmonary/Chest: Breath sounds normal. He is in respiratory distress. He has no wheezes. He has no rhonchi. He has no rales.  Diffuse rhonchi heard in the anterior fields. Increased work of breathing noted.  Abdominal: Soft. Normal appearance and bowel sounds are normal. He exhibits no distension, no ascites and no mass. There is no hepatosplenomegaly. There is no tenderness. There is no rebound, no guarding and no CVA tenderness.  PEG tube in place. No signs of infection.  Musculoskeletal: Normal range of motion. He exhibits no edema and no tenderness.  Lymphadenopathy:    He has no cervical adenopathy.  Neurological: He has normal strength. No sensory deficit. GCS eye subscore is 4. GCS verbal subscore is 5. GCS motor subscore is 6.  Skin: Skin is warm, dry and intact. Rash noted. No petechiae noted. He is not diaphoretic. No erythema. No pallor.  Skin tears noted over the sacrum. No decubitus ulcer seen.  Psychiatric: He has a normal mood and affect. His behavior is normal. Judgment normal.    ED Course   Procedures (including critical care time) Labs Review Labs Reviewed  BLOOD GAS, VENOUS - Abnormal; Notable for the following:    pH, Ven 7.322 (*)    pCO2, Ven 58.2 (*)    pO2, Ven 65.2 (*)    Bicarbonate 29.1 (*)    Acid-Base Excess 2.7 (*)    All other components within normal limits  CBC WITH DIFFERENTIAL - Abnormal; Notable for the following:    RBC 4.20 (*)    Hemoglobin 9.9 (*)    HCT 31.2 (*)    MCV 74.3 (*)    MCH 23.6 (*)    All other components within normal limits  COMPREHENSIVE METABOLIC PANEL - Abnormal; Notable for the following:    Glucose, Bld 120 (*)    Total Protein 8.7 (*)    Albumin 3.2 (*)    Alkaline Phosphatase 121 (*)    All  other components within normal limits  URINALYSIS, ROUTINE W REFLEX MICROSCOPIC - Abnormal; Notable for the following:    Color, Urine AMBER (*)    APPearance CLOUDY (*)    Specific Gravity, Urine 1.035 (*)    Hgb urine dipstick MODERATE (*)    Protein, ur 100 (*)    Leukocytes, UA SMALL (*)    All other components within normal limits  URINE MICROSCOPIC-ADD ON - Abnormal; Notable for the following:    Bacteria, UA MANY (*)    All other components within normal limits  BLOOD GAS, ARTERIAL - Abnormal; Notable for the following:    pH, Arterial 7.319 (*)    pCO2 arterial 49.3 (*)    pO2, Arterial 186.0 (*)    Bicarbonate 24.4 (*)    All other components within normal limits  MRSA PCR SCREENING  CULTURE, BLOOD (ROUTINE X 2)  CULTURE, BLOOD (ROUTINE X 2)  URINE CULTURE  COMPREHENSIVE METABOLIC PANEL  CBC WITH DIFFERENTIAL  TSH  PHENYTOIN LEVEL, TOTAL  AMMONIA  PROTIME-INR  CBC  I-STAT CG4 LACTIC ACID, ED  I-STAT VENOUS BLOOD GAS, ED    Imaging Review Ct Head Wo Contrast  01/02/2014   CLINICAL DATA:  Acute onset of fever. Acute encephalopathy. Personal history of seizures.  EXAM: CT HEAD WITHOUT CONTRAST  TECHNIQUE: Contiguous axial images were obtained from the base of the skull through the vertex without intravenous  contrast.  COMPARISON:  CT of the head performed 04/27/2004  FINDINGS: There is a large left-sided acute on chronic subdural hematoma. This measures approximately 1.3 cm in thickness at the level of the lateral ventricles. At the vertex, it measures up to 3 cm, though this reflects the plane of imaging. Scattered foci of acute blood are noted within the hematoma. There is marked associated mass effect, with approximately 1.4 cm of rightward midline shift.  Underlying moderate cortical volume loss is noted. Cerebellar atrophy is seen. Scattered periventricular white matter change likely reflects small vessel ischemic microangiopathy. Chronic ischemic change is noted with regard to the external capsule bilaterally.  The brainstem and fourth ventricle are within normal limits.  There is no evidence of fracture; visualized osseous structures are unremarkable in appearance. The visualized portions of the orbits are within normal limits. The paranasal sinuses and mastoid air cells are well-aerated. No significant soft tissue abnormalities are seen.  IMPRESSION: 1. Large left-sided acute on chronic subdural hematoma, measuring 1.3 cm in thickness at the level of the lateral ventricles. It measures up to 3 cm at the vertex, though this reflects the plane of imaging. Scattered foci of acute blood noted within the hematoma. Marked associated mass effect, with 1.4 cm of rightward midline shift. 2. Moderate cortical volume loss and scattered small vessel ischemic microangiopathy. Chronic ischemic change noted with regard to the external capsule bilaterally.  Critical Value/emergent results were called by telephone at the time of interpretation on 01/02/2014 at 3:57 am to Dr. Gean Birchwood, who verbally acknowledged these results.   Electronically Signed   By: Garald Balding M.D.   On: 01/02/2014 04:00   Ct Lumbar Spine Wo Contrast  01/02/2014   CLINICAL DATA:  Back pain.  No injury.  History of quadriplegia.  EXAM: CT  LUMBAR SPINE WITHOUT CONTRAST  TECHNIQUE: Multidetector CT imaging of the lumbar spine was performed without intravenous contrast administration. Multiplanar CT image reconstructions were also generated.  COMPARISON:  Prior CT from 06/22/2013  FINDINGS: Vertebral bodies are normally aligned with preservation of the normal lumbar  lordosis. Osteopenia noted.  Chronic anterior height loss at the superior endplate of U20 with associated degenerative endplate Schmorl's node is stable from prior study. Minimal height loss at T11 also unchanged. There is increased central height loss at the superior endplate of L5 as compared to prior CT from 06/22/2013. No discrete fracture line is seen. Otherwise, vertebral body heights are preserved.  Vertebral bodies are normally aligned with preservation of the normal lumbar lordosis.  The conus medullaris terminates at a normal level, and is grossly unremarkable.  Fatty atrophy noted within the paraspinous musculature. No acute soft tissue abnormality.  At T10-11 through L1-2, there is no disc bulge or disc protrusion. No significant canal or foraminal stenosis.  L2-3: There is mild disc bulge with bilateral facet hypertrophy. No focal disc protrusion. No canal or foraminal stenosis.  L3-4: There is mild diffuse disc bulge with bilateral facet hypertrophy and ligamentum flavum thickening. No significant canal or foraminal stenosis. No focal disc protrusion.  L4-5: There is diffuse disc bulging. There is a superimposed right foraminal disc protrusion without definite nerve root impingement (series 3, image 109). Bilateral facet hypertrophy with ligamentum flavum thickening present. There is resultant mild canal stenosis. Mild bilateral foraminal narrowing.  L5-S1 mild bilateral facet hypertrophy with mild diffuse disc bulge. No canal or foraminal stenosis.  IMPRESSION: 1. Increased central height loss at the superior endplate of L5 as compared to most recent CT from 11/13/2013. No  definite discrete fracture line identified. Correlation with MRI could be performed if there is clinical concern for possible occult compression fracture at this level. 2. Stable anterior wedging of the T11 and T12 vertebral bodies. 3. Degenerative disc bulge with superimposed right foraminal disc protrusion at L4-5 with resultant mild canal bilateral foraminal stenosis. 4. Additional mild degenerative changes as above.   Electronically Signed   By: Jeannine Boga M.D.   On: 01/02/2014 04:24   Dg Chest Port 1 View  01/02/2014   CLINICAL DATA:  Fever beginning yesterday. Shortness of breath and wheezing. Initial encounter.  EXAM: PORTABLE CHEST - 1 VIEW  COMPARISON:  One-view chest 06/20/2013.  FINDINGS: The patient is rotated to the right. The heart size is upper limits of normal. This is exaggerated by low lung volumes. Right peritracheal soft tissue prominence is likely vascular and projectional. Chronic interstitial coarsening is evident. The right hemidiaphragm is further elevated with asymmetric subsegmental atelectasis at the right base.  IMPRESSION: 1. Low lung volumes with new elevation of the right hemidiaphragm. 2. Bibasilar subsegmental atelectasis, right greater than left. 3. Chronic interstitial coarsening.   Electronically Signed   By: Lawrence Santiago M.D.   On: 01/02/2014 00:36     EKG Interpretation None      MDM   Final diagnoses:  SOB (shortness of breath)    Patient presents emergency department out of concern for altered mental status and persistent fever. Rectal temperature does not show fever however he recently had Tylenol per family. Will obtain broad infectious workup, patient was given broad-spectrum antibiotics and will be admitted to the hospital for continued care.  Upon repeat evaluation the patient, his mental status has improved with BiPAP. Initial CO2 was 59 on venous blood gas, and repeat arterial blood gas shows PCO2 of 49. Patient's UTI may be causing his  sepsis and altered mental status. He'll be admitted to the step down unit with the hospitalist service.  CRITICAL CARE Performed by: Everlene Balls   Total critical care time: 40 min.  Critical care time  was exclusive of separately billable procedures and treating other patients.  Critical care was necessary to treat or prevent imminent or life-threatening deterioration.  Critical care was time spent personally by me on the following activities: development of treatment plan with patient and/or surrogate as well as nursing, discussions with consultants, evaluation of patient's response to treatment, examination of patient, obtaining history from patient or surrogate, ordering and performing treatments and interventions, ordering and review of laboratory studies, ordering and review of radiographic studies, pulse oximetry and re-evaluation of patient's condition.    Everlene Balls, MD 01/02/14 [phone]

## 2014-01-02 NOTE — ED Notes (Signed)
Hospitalist at bedside 
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
## 2014-01-03 ENCOUNTER — Inpatient Hospital Stay (HOSPITAL_COMMUNITY): Payer: Medicare Other

## 2014-01-03 LAB — BASIC METABOLIC PANEL
Anion gap: 12 (ref 5–15)
BUN: 18 mg/dL (ref 6–23)
CALCIUM: 8.5 mg/dL (ref 8.4–10.5)
CO2: 25 meq/L (ref 19–32)
Chloride: 104 mEq/L (ref 96–112)
Creatinine, Ser: 0.49 mg/dL — ABNORMAL LOW (ref 0.50–1.35)
GFR calc Af Amer: >90 mL/min (ref >90)
Glucose, Bld: 114 mg/dL — ABNORMAL HIGH (ref 70–99)
Potassium: 3.8 mEq/L (ref 3.7–5.3)
SODIUM: 141 meq/L (ref 137–147)

## 2014-01-03 LAB — GLUCOSE, CAPILLARY
GLUCOSE-CAPILLARY: 113 mg/dL — AB (ref 70–99)
GLUCOSE-CAPILLARY: 135 mg/dL — AB (ref 70–99)
GLUCOSE-CAPILLARY: 105 mg/dL — AB (ref 70–99)
Glucose-Capillary: 100 mg/dL — ABNORMAL HIGH (ref 70–99)
Glucose-Capillary: 109 mg/dL — ABNORMAL HIGH (ref 70–99)
Glucose-Capillary: 131 mg/dL — ABNORMAL HIGH (ref 70–99)

## 2014-01-03 LAB — CBC
HEMATOCRIT: 25.4 % — AB (ref 39.0–52.0)
Hemoglobin: 8.2 g/dL — ABNORMAL LOW (ref 13.0–17.0)
MCH: 24.2 pg — ABNORMAL LOW (ref 26.0–34.0)
MCHC: 32.3 g/dL (ref 30.0–36.0)
MCV: 74.9 fL — ABNORMAL LOW (ref 78.0–100.0)
Platelets: 233 10*3/uL (ref 150–400)
RBC: 3.39 MIL/uL — ABNORMAL LOW (ref 4.22–5.81)
RDW: 13.7 % (ref 11.5–15.5)
WBC: 10.3 10*3/uL (ref 4.0–10.5)

## 2014-01-03 LAB — MAGNESIUM: Magnesium: 2 mg/dL (ref 1.5–2.5)

## 2014-01-03 LAB — PHOSPHORUS: Phosphorus: 1.9 mg/dL — ABNORMAL LOW (ref 2.3–4.6)

## 2014-01-03 MED ORDER — FLUDROCORTISONE ACETATE 0.1 MG PO TABS
0.1000 mg | ORAL_TABLET | Freq: Every day | ORAL | Status: DC
  Administered 2014-01-03 – 2014-01-06 (×4): 0.1 mg
  Filled 2014-01-03 (×5): qty 1

## 2014-01-03 MED ORDER — ACETAMINOPHEN 325 MG PO TABS
650.0000 mg | ORAL_TABLET | Freq: Four times a day (QID) | ORAL | Status: DC | PRN
  Administered 2014-01-06: 650 mg
  Filled 2014-01-03: qty 2

## 2014-01-03 MED ORDER — FLUDROCORTISONE 0.1 MG/ML ORAL SUSPENSION
0.1000 mg | Freq: Every day | ORAL | Status: DC
  Filled 2014-01-03: qty 1

## 2014-01-03 MED ORDER — FREE WATER
200.0000 mL | Freq: Three times a day (TID) | Status: DC
  Administered 2014-01-03 – 2014-01-06 (×8): 200 mL

## 2014-01-03 NOTE — Progress Notes (Signed)
Subjective: Patient without complaints (verbalization is limited, but his mother is able to understand much of his gutteral sounds).  Objective: Vital signs in last 24 hours: Filed Vitals:   01/03/14 0400 01/03/14 0500 01/03/14 0600 01/03/14 0700  BP: 103/52 96/58 110/59 114/62  Pulse: 92 85 86 88  Temp:      TempSrc:      Resp: 26 21 24 22   Height:      Weight:      SpO2: 97% 99% 98% 96%    Intake/Output from previous day: 10/08 0701 - 10/09 0700 In: 2485.8 [I.V.:1445; NG/GT:980.8] Out: 610 [Urine:610] Intake/Output this shift:    Physical Exam:  Awake and alert, following commands briskly with all 4 extremities. Briskly follows commands left or right specifically. Pupils 3 mm equal, round, reactive to light. Spastic quadriparesis with contractions.  CBC  Recent Labs  01/02/14 0730 01/03/14 0224  WBC 9.3 10.3  HGB 9.3* 8.2*  HCT 29.4* 25.4*  PLT 282 233   BMET  Recent Labs  01/02/14 0730 01/03/14 0224  NA 138 141  K 3.9 3.8  CL 102 104  CO2 24 25  GLUCOSE 163* 114*  BUN 16 18  CREATININE 0.55 0.49*  CALCIUM 9.1 8.5   ABG    Component Value Date/Time   PHART 7.319* 01/02/2014 0210   PCO2ART 49.3* 01/02/2014 0210   PO2ART 186.0* 01/02/2014 0210   HCO3 24.4* 01/02/2014 0210   TCO2 22.8 01/02/2014 0210   ACIDBASEDEF 1.3 01/02/2014 0210   O2SAT 99.1 01/02/2014 0210    Studies/Results: Ct Head Wo Contrast  01/03/2014   CLINICAL DATA:  Followup subdural hematoma.  EXAM: CT HEAD WITHOUT CONTRAST  TECHNIQUE: Contiguous axial images were obtained from the base of the skull through the vertex without intravenous contrast.  COMPARISON:  Prior CT from 01/02/2014  FINDINGS: Previously identified acute on chronic left subdural hematoma again seen. This collection is not significantly changed in size measuring up to 1.3 cm in transverse diameter at the level of the left frontal lobe. Again, this measures 3 cm at the vertex. The acute blood components are slightly different  in location, but overall likely not significantly changed in volume with no definite new bleeding. Left-to-right midline shift at the level of the septum pellucidum measures approximately 1.1 cm, likely not significantly changed. Ventricle size is stable without hydrocephalus.  Parenchymal volume loss with small vessel disease again noted. Remote ischemic infarcts within the bilateral external capsules again noted. No acute large vessel territory infarct.  Calvarium and scalp soft tissues within normal limits. No acute abnormality seen about either orbit. Paranasal sinuses and mastoid air cells are clear.  IMPRESSION: 1. Stable size of acute on chronic left subdural hematoma measuring up to 1.3 cm in thickness with similar left-to-right midline shift of 1.1 cm. 2. No new intracranial abnormality.   Electronically Signed   By: Jeannine Boga M.D.   On: 01/03/2014 07:28   Ct Head Wo Contrast  01/02/2014   CLINICAL DATA:  Acute onset of fever. Acute encephalopathy. Personal history of seizures.  EXAM: CT HEAD WITHOUT CONTRAST  TECHNIQUE: Contiguous axial images were obtained from the base of the skull through the vertex without intravenous contrast.  COMPARISON:  CT of the head performed 04/27/2004  FINDINGS: There is a large left-sided acute on chronic subdural hematoma. This measures approximately 1.3 cm in thickness at the level of the lateral ventricles. At the vertex, it measures up to 3 cm, though this reflects the plane  of imaging. Scattered foci of acute blood are noted within the hematoma. There is marked associated mass effect, with approximately 1.4 cm of rightward midline shift.  Underlying moderate cortical volume loss is noted. Cerebellar atrophy is seen. Scattered periventricular white matter change likely reflects small vessel ischemic microangiopathy. Chronic ischemic change is noted with regard to the external capsule bilaterally.  The brainstem and fourth ventricle are within normal limits.   There is no evidence of fracture; visualized osseous structures are unremarkable in appearance. The visualized portions of the orbits are within normal limits. The paranasal sinuses and mastoid air cells are well-aerated. No significant soft tissue abnormalities are seen.  IMPRESSION: 1. Large left-sided acute on chronic subdural hematoma, measuring 1.3 cm in thickness at the level of the lateral ventricles. It measures up to 3 cm at the vertex, though this reflects the plane of imaging. Scattered foci of acute blood noted within the hematoma. Marked associated mass effect, with 1.4 cm of rightward midline shift. 2. Moderate cortical volume loss and scattered small vessel ischemic microangiopathy. Chronic ischemic change noted with regard to the external capsule bilaterally.  Critical Value/emergent results were called by telephone at the time of interpretation on 01/02/2014 at 3:57 am to Dr. Gean Birchwood, who verbally acknowledged these results.   Electronically Signed   By: Garald Balding M.D.   On: 01/02/2014 04:00   Ct Lumbar Spine Wo Contrast  01/02/2014   CLINICAL DATA:  Back pain.  No injury.  History of quadriplegia.  EXAM: CT LUMBAR SPINE WITHOUT CONTRAST  TECHNIQUE: Multidetector CT imaging of the lumbar spine was performed without intravenous contrast administration. Multiplanar CT image reconstructions were also generated.  COMPARISON:  Prior CT from 06/22/2013  FINDINGS: Vertebral bodies are normally aligned with preservation of the normal lumbar lordosis. Osteopenia noted.  Chronic anterior height loss at the superior endplate of J62 with associated degenerative endplate Schmorl's node is stable from prior study. Minimal height loss at T11 also unchanged. There is increased central height loss at the superior endplate of L5 as compared to prior CT from 06/22/2013. No discrete fracture line is seen. Otherwise, vertebral body heights are preserved.  Vertebral bodies are normally aligned with  preservation of the normal lumbar lordosis.  The conus medullaris terminates at a normal level, and is grossly unremarkable.  Fatty atrophy noted within the paraspinous musculature. No acute soft tissue abnormality.  At T10-11 through L1-2, there is no disc bulge or disc protrusion. No significant canal or foraminal stenosis.  L2-3: There is mild disc bulge with bilateral facet hypertrophy. No focal disc protrusion. No canal or foraminal stenosis.  L3-4: There is mild diffuse disc bulge with bilateral facet hypertrophy and ligamentum flavum thickening. No significant canal or foraminal stenosis. No focal disc protrusion.  L4-5: There is diffuse disc bulging. There is a superimposed right foraminal disc protrusion without definite nerve root impingement (series 3, image 109). Bilateral facet hypertrophy with ligamentum flavum thickening present. There is resultant mild canal stenosis. Mild bilateral foraminal narrowing.  L5-S1 mild bilateral facet hypertrophy with mild diffuse disc bulge. No canal or foraminal stenosis.  IMPRESSION: 1. Increased central height loss at the superior endplate of L5 as compared to most recent CT from 11/13/2013. No definite discrete fracture line identified. Correlation with MRI could be performed if there is clinical concern for possible occult compression fracture at this level. 2. Stable anterior wedging of the T11 and T12 vertebral bodies. 3. Degenerative disc bulge with superimposed right foraminal disc protrusion  at L4-5 with resultant mild canal bilateral foraminal stenosis. 4. Additional mild degenerative changes as above.   Electronically Signed   By: Jeannine Boga M.D.   On: 01/02/2014 04:24   Dg Chest Port 1 View  01/02/2014   CLINICAL DATA:  Fever beginning yesterday. Shortness of breath and wheezing. Initial encounter.  EXAM: PORTABLE CHEST - 1 VIEW  COMPARISON:  One-view chest 06/20/2013.  FINDINGS: The patient is rotated to the right. The heart size is upper  limits of normal. This is exaggerated by low lung volumes. Right peritracheal soft tissue prominence is likely vascular and projectional. Chronic interstitial coarsening is evident. The right hemidiaphragm is further elevated with asymmetric subsegmental atelectasis at the right base.  IMPRESSION: 1. Low lung volumes with new elevation of the right hemidiaphragm. 2. Bibasilar subsegmental atelectasis, right greater than left. 3. Chronic interstitial coarsening.   Electronically Signed   By: Lawrence Santiago M.D.   On: 01/02/2014 00:36   Dg Abd Portable 1v  01/02/2014   CLINICAL DATA:  Gastrostomy tube.  Initial evaluation.  EXAM: PORTABLE ABDOMEN - 1 VIEW  COMPARISON:  CT 11/13/2013.  FINDINGS: What appears to be a gastrostomy tube is noted projected over left upper quadrant. Stomach is not distended. No significant bowel distention. Stool in rectum. Calcified pelvic densities consistent with phleboliths.  IMPRESSION: What appears to be a gastrostomy tube is noted projected over left upper quadrant over the stomach. No gastric distention.   Electronically Signed   By: Marcello Moores  Register   On: 01/02/2014 10:44    Assessment/Plan: Repeat CT of the brain without this morning shows no change in left hemispheric chronic subdural hematoma, but is associated with mild mass effect and shift.  Patient was examined this morning in the presence of his parents, and they feel that he is back to his baseline as regards responsiveness, communication, and movement.  Spoke with the patient, his parents, his brother, and his sister-in-law, at the bedside, and reviewed his CT on the computer monitor at the bedside. We discussed the alternatives of craniotomy for evacuation of subdural hematoma versus monitoring the subdural hematoma as an outpatient.   From a surgical perspective there are risks of rehemorrhage and reaccumulation of subdural hematoma, requiring reoperation, craniectomy with implantation of the bone flap into  the abdominal wall, and possible further complications. Additional risks including infection, bleeding, increased neurologic deficit, and anesthetic consultations including myocardial infarction, stroke, pneumonia, and death.  With monitoring I explained that the subdural hematoma may gradually resolve over the next 4-6 months, it could remain unchanged in size, or there could be further hemorrhage into it with increased mass effect, shift, and neurologic dysfunction. The patient would need to periodically return for outpatient followup in the office with a CT scan prior to the office visit, with the initial visit being about a month after discharge from this hospitalization. He should not receive any anticoagulants or antiplatelet agents.   After discussing all this with the patient and his family, the patient clearly indicated that he would rather not undergo surgery, and that is the preference as well of his family. With that in mind I recommend that the patient to be transferred out of the ICU, monitored for 2 or 3 days or so, and then should be able to return to the skilled nursing facility. I will plan to see the patient and his family on the morning of October 12, and if he is neurologically stable, he'll be up to be discharged from  a neurosurgical perspective, and I would need to see him a month later in my office for neurosurgical followup, with a CT scan to be done immediately prior to the appointment.   Continued management of his UTI and other medical conditions is deferred to the PCCM and [facility] services.   Hosie Spangle, MD 01/03/2014, 7:54 AM

## 2014-01-03 NOTE — Progress Notes (Signed)
No new complaints. Communicative but nonverbal (mostly grunts) No distress. Mother indicates that he is essentially back to baseline  Filed Vitals:   01/03/14 0600 01/03/14 0700 01/03/14 0800 01/03/14 0900  BP: 110/59 114/62 111/58 121/57  Pulse: 86 88 86 86  Temp:   97.6 F (36.4 C)   TempSrc:   Oral   Resp: 24 22 21 28   Height:      Weight:      SpO2: 98% 96% 97% 100%   NAD Chest clear RRR NABS No edema Chronic quadriparesis   I have reviewed all of today's lab results. 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  After 5:30 PM or weekends, call [phone]

## 2014-01-04 ENCOUNTER — Inpatient Hospital Stay (HOSPITAL_COMMUNITY): Payer: Medicare Other

## 2014-01-04 DIAGNOSIS — K5641 Fecal impaction: Secondary | ICD-10-CM

## 2014-01-04 LAB — CBC
HCT: 25.7 % — ABNORMAL LOW (ref 39.0–52.0)
Hemoglobin: 8.3 g/dL — ABNORMAL LOW (ref 13.0–17.0)
MCH: 23.7 pg — AB (ref 26.0–34.0)
MCHC: 32.3 g/dL (ref 30.0–36.0)
MCV: 73.4 fL — ABNORMAL LOW (ref 78.0–100.0)
PLATELETS: 299 10*3/uL (ref 150–400)
RBC: 3.5 MIL/uL — AB (ref 4.22–5.81)
RDW: 13.6 % (ref 11.5–15.5)
WBC: 7.6 10*3/uL (ref 4.0–10.5)

## 2014-01-04 LAB — URINE CULTURE
Colony Count: >=100000
SPECIAL REQUESTS: NORMAL

## 2014-01-04 MED ORDER — DEXTROSE 5 % IV SOLN
1.0000 g | INTRAVENOUS | Status: DC
  Administered 2014-01-04 – 2014-01-05 (×2): 1 g via INTRAVENOUS
  Filled 2014-01-04 (×3): qty 10

## 2014-01-04 NOTE — Progress Notes (Signed)
PROGRESS NOTE    Kensen Tierrablanca NGE:[id number] DOB: 1955/05/23 DOA: 01/01/2014 PCP: [name], MD  HPI/Brief narrative 58 year old male, SNF resident with history of aphasia and spastic quadriparesis, PEG tube, seizures, chronic anemia, chronic kidney disease, presented to the ED with increasing drowsiness & foul-smelling urine. He was started on antibiotics for presumed UTI by his M.D. at SNF despite which his mental status did not improve. In the ED, ABG showed mild hypercarbic respiratory failure, UA suggested possible UTI, mildly febrile and drowsy on exam. CT head showed acute on chronic subdural hematoma with mass shift. Neurosurgery and CCM were consulted and patient was admitted to ICU.     Assessment/Plan:  1. Chronic left subdural hematoma with mild mass effect and shift: Neurosurgery consulted and patient underwent serial CT heads x2 without significant change. Dr. Newell Coral, Neurosurgery input much appreciated-he has evaluated extensively and discussed in detail with family on multiple occasions. Patient's mental status has returned to baseline. The benefits and risks of continued observation as we are doing now versus surgical route were discussed at length by neurosurgery with the family and family opted conservative/observation management. At this time the recommendation is to monitor in the hospital for additional 48 hours and possibly discharge to SNF on 01/06/14 and then followup with neurosurgery in one month with repeat CT head.  2. Acute hypercarbic respiratory failure/atelectasis: Mild. Unclear etiology. Resolved after brief BiPAP. BiPAP when necessary. Pulmonary hygiene. 3. S/P PEG tube: Tube feedings ongoing without residuals.  4. Proteus mirabilis UTI: Received a single dose of cefepime and vancomycin on 10/7 and none since. Will resume cefepime pending final urine culture results. Blood cultures x2: Negative to date 5. Chronic anemia: Stable over the last 24  hours. Follow CBCs closely and transfuse if hemoglobin less than 7 g per DL. 6. Chronic aphasia and quadriparesis 7. History of adrenal insufficiency: On chronic fludrocortisone. 8. Partial bowel obstruction/fecal impaction: Patient's abdomen is moderately distended. X-rays reviewed. Trial of enema x1. 9. History of seizures: Continue phenytoin  Code Status: Full  Family Communication: None at bedside  Disposition Plan: SNF when medically stable   Consultants:  Neurosurgery  CCM  Procedures:  PEG tube-present prior to admission  Antibiotics:  IV cefepime and vancomycin x1 dose on 10/7  IV cefepime 10/10 >   Subjective: Communicative but nonverbal-attempting to tell something but unable to understand. Does not appear in pain.  Objective: Filed Vitals:   01/03/14 2211 01/04/14 0232 01/04/14 0641 01/04/14 0912  BP: 117/63 108/58 110/52 102/48  Pulse: 87 86 88 81  Temp: 97.9 F (36.6 C) 98.1 F (36.7 C) 97.6 F (36.4 C) 98.4 F (36.9 C)  TempSrc: Oral Oral Oral Oral  Resp: 20 22 22 22   Height:      Weight:      SpO2: 98% 100% 100% 100%    Intake/Output Summary (Last 24 hours) at 01/04/14 1130 Last data filed at 01/04/14 0600  Gross per 24 hour  Intake   1260 ml  Output    825 ml  Net    435 ml   Filed Weights   01/02/14 0452  Weight: 70.5 kg (155 lb 6.8 oz)     Exam:  General exam: Moderately built and nourished middle-aged male lying comfortably in bed  Respiratory system:  poor inspiratory effort and slightly diminished breath sounds in the bases. Rest of lung fields clear to auscultation. No increased work of breathing. Cardiovascular system: S1 & S2 heard, RRR. No JVD, murmurs, gallops, clicks  or pedal edema. Gastrointestinal system: Abdomen is moderately distended but not tender or tense. Tinkling bowel sounds heard. PEG tube site intact. Normal bowel sounds heard. Central nervous system: Alert and  follow some instructions. Aphasic. No focal  neurological deficits. Extremities:  contractures of upper extremities >lower extremities but able to move all.   Data Reviewed: Basic Metabolic Panel:  Recent Labs Lab 01/02/14 0033 01/02/14 0730 01/03/14 0224  NA 137 138 141  K 4.0 3.9 3.8  CL 96 102 104  CO2 26 24 25   GLUCOSE 120* 163* 114*  BUN 20 16 18   CREATININE 0.65 0.55 0.49*  CALCIUM 9.2 9.1 8.5  MG  --   --  2.0  PHOS  --   --  1.9*   Liver Function Tests:  Recent Labs Lab 01/02/14 0033 01/02/14 0730  AST 33 23  ALT 11 8  ALKPHOS 121* 114  BILITOT 0.4 0.4  PROT 8.7* 8.4*  ALBUMIN 3.2* 3.0*   No results found for this basename: LIPASE, AMYLASE,  in the last 168 hours  Recent Labs Lab 01/02/14 0730  AMMONIA 34   CBC:  Recent Labs Lab 01/02/14 0033 01/02/14 0730 01/03/14 0224 01/04/14 0839  WBC 8.4 9.3 10.3 7.6  NEUTROABS 5.6 7.8*  --   --   HGB 9.9* 9.3* 8.2* 8.3*  HCT 31.2* 29.4* 25.4* 25.7*  MCV 74.3* 76.2* 74.9* 73.4*  PLT 316 282 233 299   Cardiac Enzymes: No results found for this basename: CKTOTAL, CKMB, CKMBINDEX, TROPONINI,  in the last 168 hours BNP (last 3 results) No results found for this basename: PROBNP,  in the last 8760 hours CBG:  Recent Labs Lab 01/03/14 0336 01/03/14 0801 01/03/14 1120 01/03/14 1647 01/03/14 2103  GLUCAP 100* 113* 135* 109* 105*    Recent Results (from the past 240 hour(s))  CULTURE, BLOOD (ROUTINE X 2)     Status: None   Collection Time    01/02/14 12:33 AM      Result Value Ref Range Status   Specimen Description BLOOD RIGHT HAND   Final   Special Requests BOTTLES DRAWN AEROBIC AND ANAEROBIC 5CC   Final   Culture  Setup Time     Final   Value: 01/02/2014 04:02     Performed at [facility]   Culture     Final   Value:        BLOOD CULTURE RECEIVED NO GROWTH TO DATE CULTURE WILL BE HELD FOR 5 DAYS BEFORE ISSUING A FINAL NEGATIVE REPORT     Performed at [facility]   Report Status PENDING   Incomplete  CULTURE, BLOOD  (ROUTINE X 2)     Status: None   Collection Time    01/02/14 12:33 AM      Result Value Ref Range Status   Specimen Description BLOOD LEFT HAND   Final   Special Requests BOTTLES DRAWN AEROBIC AND ANAEROBIC 5CC   Final   Culture  Setup Time     Final   Value: 01/02/2014 04:03     Performed at [facility]   Culture     Final   Value:        BLOOD CULTURE RECEIVED NO GROWTH TO DATE CULTURE WILL BE HELD FOR 5 DAYS BEFORE ISSUING A FINAL NEGATIVE REPORT     Performed at [facility]   Report Status PENDING   Incomplete  URINE CULTURE     Status: None   Collection Time  01/02/14 12:35 AM      Result Value Ref Range Status   Specimen Description URINE, CATHETERIZED   Final   Special Requests Normal   Final   Culture  Setup Time     Final   Value: 01/02/2014 08:55     Performed at [facility]   Colony Count     Final   Value: >=100,000 COLONIES/ML     Performed at [facility]   Culture     Final   Value: PROTEUS MIRABILIS     Performed at [facility]   Report Status PENDING   Incomplete  MRSA PCR SCREENING     Status: None   Collection Time    01/02/14  3:59 AM      Result Value Ref Range Status   MRSA by PCR NEGATIVE  NEGATIVE Final   Comment:            The GeneXpert MRSA Assay (FDA     approved for NASAL specimens     only), is one component of a     comprehensive MRSA colonization     surveillance program. It is not     intended to diagnose MRSA     infection nor to guide or     monitor treatment for     MRSA infections.          Studies: Ct Head Wo Contrast  01/03/2014   CLINICAL DATA:  Followup subdural hematoma.  EXAM: CT HEAD WITHOUT CONTRAST  TECHNIQUE: Contiguous axial images were obtained from the base of the skull through the vertex without intravenous contrast.  COMPARISON:  Prior CT from 01/02/2014  FINDINGS: Previously identified acute on chronic left subdural hematoma again seen. This collection is not  significantly changed in size measuring up to 1.3 cm in transverse diameter at the level of the left frontal lobe. Again, this measures 3 cm at the vertex. The acute blood components are slightly different in location, but overall likely not significantly changed in volume with no definite new bleeding. Left-to-right midline shift at the level of the septum pellucidum measures approximately 1.1 cm, likely not significantly changed. Ventricle size is stable without hydrocephalus.  Parenchymal volume loss with small vessel disease again noted. Remote ischemic infarcts within the bilateral external capsules again noted. No acute large vessel territory infarct.  Calvarium and scalp soft tissues within normal limits. No acute abnormality seen about either orbit. Paranasal sinuses and mastoid air cells are clear.  IMPRESSION: 1. Stable size of acute on chronic left subdural hematoma measuring up to 1.3 cm in thickness with similar left-to-right midline shift of 1.1 cm. 2. No new intracranial abnormality.   Electronically Signed   By: Rise Mu M.D.   On: 01/03/2014 07:28   Dg Abd Portable 1v  01/04/2014   CLINICAL DATA:  Initial encounter for abdominal distention.  EXAM: PORTABLE ABDOMEN - 1 VIEW  COMPARISON:  One-view abdomen 01/02/2014.  FINDINGS: A gastrostomy tube is stable in appearance. Gas is present within the stomach. A distended loop of bowel is noted in the left lower quadrant. A moderate amount of stool is again seen within the rectum. The bowel gas pattern is otherwise unremarkable. The visualized soft tissues and bony thorax are unremarkable.  IMPRESSION: 1. Distended loop of bowel in the left lower quadrant appears to be colon. This may represent a partial obstruction. Moderate stool is present in the rectum. This could be secondary to impaction. 2. The more proximal  bowel is unremarkable. 3. Stable positioning of the gastrostomy tube.   Electronically Signed   By: Gennette Pac M.D.   On:  01/04/2014 09:23        Scheduled Meds: . antiseptic oral rinse  7 mL Mouth Rinse q12n4p  . baclofen  10 mg Per Tube TID  . chlorhexidine  15 mL Mouth Rinse BID  . feeding supplement (PRO-STAT SUGAR FREE 64)  30 mL Per Tube TID  . ferrous sulfate  300 mg Per Tube q morning - 10a  . fludrocortisone  0.1 mg Per Tube Daily  . folic acid  1 mg Per Tube q morning - 10a  . free water  200 mL Per Tube 3 times per day  . olopatadine  1 drop Both Eyes q morning - 10a  . phenytoin  125 mg Per Tube BID  . polyvinyl alcohol  1 drop Both Eyes Daily  . saccharomyces boulardii  250 mg Oral BID   Continuous Infusions: . feeding supplement (OSMOLITE 1.5 CAL) 1,000 mL (01/03/14 2000)    Principal Problem:   Subdural hematoma Active Problems:   Adrenal insufficiency   UTI (lower urinary tract infection)   Acute encephalopathy   Acute respiratory failure with hypercapnia   Chronic anemia    Time spent: 40 minutes.    Marcellus Scott, MD, FACP, FHM. [facility] Pager [phone]  If 7PM-7AM, please contact night-coverage [email or web address] Password [email or web address] 01/04/2014, 11:30 AM    LOS: 3 days

## 2014-01-04 NOTE — Progress Notes (Signed)
Orders for soap suds enema per MD, administered tap water soap suds enema, pt. Tolerated without distress. Moderate return of soft dark greenish/black in color. Hemoccult stool sample sent to lab but was rejected, will have to recollect.

## 2014-01-04 NOTE — Evaluation (Signed)
Physical Therapy Evaluation Patient Details Name: [name] MRN: [id number] DOB: 15-Jun-1955 Today's Date: 01/04/2014   History of Present Illness  58 year old male, SNF resident with history of aphasia and spastic quadriparesis, PEG tube, seizures, chronic anemia, chronic kidney disease, presented to the ED with increasing drowsiness & foul-smelling urine. He was started on antibiotics for presumed UTI by his M.D. at SNF despite which his mental status did not improve. In the ED, ABG showed mild hypercarbic respiratory failure, UA suggested possible UTI, mildly febrile and drowsy on exam. CT head showed acute on chronic subdural hematoma with mass shift.  neuro following and no surgical intervention indicated at this time.   Clinical Impression  Pt adm due to above. Spoke with mother on phone, per mother at [facility] pt was working with PT and ambulatory with 2 person(A). Mother also reports pt was able to transfer to wheelchair with 1 (A). Mother requesting full therapy care in an attempt to increase pt functional mobility at this time. Pt presents with deficits inidcaited below. Evaluation greatly limited to pt lack of participation and pt resisting to sit EOB with 2 person (A). Pt will benefit from skilled PT to maximize mobility and perform ROM exercises to prevent further contractures. Recommend pt OOB with lift to nursing.     Follow Up Recommendations SNF    Equipment Recommendations  None recommended by PT    Recommendations for Other Services OT consult     Precautions / Restrictions Precautions Precautions: Fall Precaution Comments: pt very contracted; non verbal  Restrictions Weight Bearing Restrictions: No      Mobility  Bed Mobility Overal bed mobility: Needs Assistance Bed Mobility: Rolling (scooting HOB ) Rolling: Total assist         General bed mobility comments: pt total (A) for rolling at this time and 2 person (A) for scooting HOB; pt resistive and  became louder with non verbalizations when attemping supine to sit for EOB activities; will plan to address next session; may benefit from attempting therapy with family present  Transfers                 General transfer comment: will require lift OOB at this time  Ambulation/Gait             General Gait Details: non ambulatory at this time  Stairs            Wheelchair Mobility    Modified Rankin (Stroke Patients Only)       Balance Overall balance assessment: Needs assistance     Sitting balance - Comments: unable to achieve sitting position EOB                                     Pertinent Vitals/Pain Pain Assessment: Faces Faces Pain Scale: Hurts whole lot Pain Location: difficult to determine; pt grunting inconsistent with PROM of all extremities     Home Living Family/patient expects to be discharged to:: Skilled nursing facility                 Additional Comments: spoke with mother on the phone; pt is non verbal; pt from [facility] nursing facility; per mother, pt was ambulatory  "short distances with 2 people and therapy"; pt would stand for wheelchair transfers with single person    Prior Function Level of Independence: Needs assistance   Gait / Transfers Assistance Needed: 2  person (A)  ADL's / Homemaking Assistance Needed: total (A)         Hand Dominance        Extremity/Trunk Assessment   Upper Extremity Assessment: Defer to OT evaluation (pt very contracted; resistive to PROM)           Lower Extremity Assessment: RLE deficits/detail;LLE deficits/detail RLE Deficits / Details: pt with minimal non purposeful movements of LEs; followed minimal commands for ROM assessment; pt resistive/spastic with PROM of LEs; -5 in extension due to contractures  LLE Deficits / Details: pt with minimal non purposeful movements of LEs; followed minimal commands for ROM assessment; pt resistive/spastic with PROM of LEs;  -5 in extension due to contractures   Cervical / Trunk Assessment: Kyphotic  Communication   Communication: Other (comment);Expressive difficulties;Receptive difficulties (non-verbal; grunts )  Cognition Arousal/Alertness: Awake/alert Behavior During Therapy: Restless Overall Cognitive Status: Difficult to assess (follows commands < 50% of time)                      General Comments General comments (skin integrity, edema, etc.): pt at risk for skin breakdown and incr contractures due to limited mobility and non verbalizations     Exercises General Exercises - Lower Extremity Ankle Circles/Pumps: PROM;Both;10 reps;Supine Heel Slides: PROM;Both;10 reps;Supine Hip ABduction/ADduction: PROM;Both;10 reps;Supine Other Exercises Other Exercises: PROM of bil wrists, elbow and shoulder to assess ROM and to (A) with prevention of contractures      Assessment/Plan    PT Assessment Patient needs continued PT services  PT Diagnosis Quadraplegia;Acute pain;Generalized weakness   PT Problem List Decreased strength;Decreased range of motion;Decreased activity tolerance;Decreased balance;Decreased mobility;Decreased cognition;Decreased knowledge of use of DME;Decreased safety awareness;Decreased knowledge of precautions;Impaired sensation;Impaired tone;Pain;Decreased coordination  PT Treatment Interventions Therapeutic exercise;Therapeutic activities;Functional mobility training;Balance training;Neuromuscular re-education;Cognitive remediation;Patient/family education;Wheelchair mobility training   PT Goals (Current goals can be found in the Care Plan section) Acute Rehab PT Goals Patient Stated Goal: mother stated " i want my son to try and do more therapy" PT Goal Formulation: With patient/family Time For Goal Achievement: 01/11/14 Potential to Achieve Goals: Fair    Frequency Min 2X/week   Barriers to discharge   pt from SNF     Co-evaluation               End of Session    Activity Tolerance: Treatment limited secondary to agitation;Other (comment) (pt resisting EOB activities ) Patient left: in bed;with call bell/phone within reach;with bed alarm set;with nursing/sitter in room Nurse Communication: Mobility status;Need for lift equipment         Time: 8657-8469 PT Time Calculation (min): 17 min   Charges:   PT Evaluation $Initial PT Evaluation Tier I: 1 Procedure PT Treatments $Therapeutic Exercise: 8-22 mins   PT G CodesDonell Sievert, [place]  [phone] 01/04/2014, 3:52 PM

## 2014-01-04 NOTE — Progress Notes (Signed)
OT Cancellation Note  Patient Details Name: [name] MRN: [id number] DOB: 04-09-1955   Cancelled Treatment:    Reason Eval/Treat Not Completed: OT screened, no needs identified, will sign off (spoke to RN, pt total care at baseline with ADLs, no change from that)  Elvie Maines A 01/04/2014, 9:34 AM

## 2014-01-04 NOTE — Progress Notes (Signed)
Patient arrived around 2000 accompanied by family patient is stable and being observed as he is resting comfortable, all medications given scd's applied.

## 2014-01-04 NOTE — Progress Notes (Signed)
Patient ID: [name], male   DOB: March 25, 1956, 58 y.o.   MRN: [id number] Afeb, vss No new neuro issues. Patient remains at his neuro baseline. Plan per Dr Sherwood Gambler at this time which is observation unless patient worsens.

## 2014-01-05 ENCOUNTER — Inpatient Hospital Stay (HOSPITAL_COMMUNITY): Payer: Medicare Other

## 2014-01-05 LAB — CBC
HEMATOCRIT: 27.1 % — AB (ref 39.0–52.0)
Hemoglobin: 8.6 g/dL — ABNORMAL LOW (ref 13.0–17.0)
MCH: 24 pg — ABNORMAL LOW (ref 26.0–34.0)
MCHC: 31.7 g/dL (ref 30.0–36.0)
MCV: 75.7 fL — AB (ref 78.0–100.0)
Platelets: 310 10*3/uL (ref 150–400)
RBC: 3.58 MIL/uL — ABNORMAL LOW (ref 4.22–5.81)
RDW: 13.7 % (ref 11.5–15.5)
WBC: 7.3 10*3/uL (ref 4.0–10.5)

## 2014-01-05 MED ORDER — SENNA 8.6 MG PO TABS
2.0000 | ORAL_TABLET | Freq: Every day | ORAL | Status: DC
  Administered 2014-01-05 – 2014-01-06 (×2): 17.2 mg
  Filled 2014-01-05 (×2): qty 2

## 2014-01-05 NOTE — Progress Notes (Signed)
Patient ID: [name], male   DOB: 1955/04/01, 58 y.o.   MRN: [id number] Subjective:  the patient is alert and in no apparent distress.  Objective: Vital signs in last 24 hours: Temp:  [97.1 F (36.2 C)-98.1 F (36.7 C)] 98.1 F (36.7 C) (10/11 0536) Pulse Rate:  [78-90] 90 (10/11 0536) Resp:  [23-30] 28 (10/11 0536) BP: (103-111)/(53-61) 105/61 mmHg (10/11 0536) SpO2:  [96 %-100 %] 100 % (10/11 0536)  Intake/Output from previous day: 10/10 0701 - 10/11 0700 In: -  Out: 1025 [Urine:1025] Intake/Output this shift:    Physical exam the patient is alert. He follows commands.  Lab Results:  Recent Labs  01/04/14 0839 01/05/14 0330  WBC 7.6 7.3  HGB 8.3* 8.6*  HCT 25.7* 27.1*  PLT 299 310   BMET  Recent Labs  01/03/14 0224  NA 141  K 3.8  CL 104  CO2 25  GLUCOSE 114*  BUN 18  CREATININE 0.49*  CALCIUM 8.5    Studies/Results: Dg Chest Port 1 View  01/05/2014   CLINICAL DATA:  Persistent cough  EXAM: PORTABLE CHEST - 1 VIEW  COMPARISON:  January 02, 2014 and June 20, 2013  FINDINGS: There is diffuse interstitial prominence without airspace consolidation. Heart is upper normal in size with pulmonary vascularity within normal limits. No adenopathy. No bone lesions.  IMPRESSION: Persistent diffuse interstitial prominence. Suspect a degree of chronic congestive heart failure. No consolidation. Apparent stable compared to recent prior study.   Electronically Signed   By: Lowella Grip M.D.   On: 01/05/2014 09:48   Dg Abd Portable 1v  01/04/2014   CLINICAL DATA:  Initial encounter for abdominal distention.  EXAM: PORTABLE ABDOMEN - 1 VIEW  COMPARISON:  One-view abdomen 01/02/2014.  FINDINGS: A gastrostomy tube is stable in appearance. Gas is present within the stomach. A distended loop of bowel is noted in the left lower quadrant. A moderate amount of stool is again seen within the rectum. The bowel gas pattern is otherwise unremarkable. The visualized soft tissues  and bony thorax are unremarkable.  IMPRESSION: 1. Distended loop of bowel in the left lower quadrant appears to be colon. This may represent a partial obstruction. Moderate stool is present in the rectum. This could be secondary to impaction. 2. The more proximal bowel is unremarkable. 3. Stable positioning of the gastrostomy tube.   Electronically Signed   By: Lawrence Santiago M.D.   On: 01/04/2014 09:23    Assessment/Plan: Neuro: The best I can tell he is at his baseline.   LOS: 4 days     Meosha Castanon D 01/05/2014, 10:29 AM

## 2014-01-05 NOTE — Progress Notes (Signed)
PROGRESS NOTE    [name] ZOX:[id number] DOB: 1955/09/22 DOA: 01/01/2014 PCP: [name], MD  HPI/Brief narrative 58 year old male, SNF resident with history of aphasia and spastic quadriparesis, PEG tube, seizures, chronic anemia, chronic kidney disease, presented to the ED with increasing drowsiness & foul-smelling urine. He was started on antibiotics for presumed UTI by his M.D. at SNF despite which his mental status did not improve. In the ED, ABG showed mild hypercarbic respiratory failure, UA suggested possible UTI, mildly febrile and drowsy on exam. CT head showed acute on chronic subdural hematoma with mass shift. Neurosurgery and CCM were consulted and patient was admitted to ICU.     Assessment/Plan:  1. Chronic left subdural hematoma with mild mass effect and shift: Neurosurgery consulted and patient underwent serial CT heads x2 without significant change. Dr. Newell Coral, Neurosurgery input much appreciated-he has evaluated extensively and discussed in detail with family on multiple occasions. Patient's mental status has returned to baseline. The benefits and risks of continued observation as we are doing now versus surgical route were discussed at length by neurosurgery with the family and family opted conservative/observation management. At this time the recommendation is to monitor in the hospital and possibly discharge to SNF on 01/06/14 and then followup with neurosurgery in one month with repeat CT head.  2. Acute hypercarbic respiratory failure/atelectasis: Mild. Unclear etiology. Resolved after brief BiPAP. BiPAP when necessary. Pulmonary hygiene. 3. S/P PEG tube: Tube feedings ongoing without residuals.  4. Proteus mirabilis UTI: Received a single dose of cefepime and vancomycin on 10/7 and none since. Patient had not received antibiotics after initial dose. Started IV Rocephin on 10/10 based on sensitivity results. Change to oral Ceftin at discharge. Blood  cultures x2: Negative to date 5. Chronic anemia: Stable. 6. Chronic aphasia and quadriparesis: At baseline 7. History of adrenal insufficiency: On chronic fludrocortisone. 8. Partial bowel obstruction/fecal impaction: Patient had a good BM after enema on 10/10 with improvement in abdominal signs. X-ray looks better. Start bowel regimen and follow clinically. 9. History of seizures: Continue phenytoin 10. There was some concern regarding abnormal breathing and wet sounding lungs by nursing this morning. Chest x-ray suggests mild chronic CHF/chronic coarsening-however patient clinically does not have features of volume overload and appears stable. Monitor clinically.  Code Status: Full  Family Communication: None at bedside  Disposition Plan: SNF possibly 10/12   Consultants:  Neurosurgery  CCM  Procedures:  PEG tube-present prior to admission  Antibiotics:  IV cefepime and vancomycin x1 dose on 10/7  IV Rocephin 10/10 >   Subjective: Nonverbal. As per nursing, concerned regarding abnormal breathing overnight and wet sounding lungs this morning.  Objective: Filed Vitals:   01/04/14 2100 01/05/14 0257 01/05/14 0536 01/05/14 1044  BP: 111/56 109/55 105/61 119/63  Pulse: 78 90 90 76  Temp: 98.1 F (36.7 C) 98 F (36.7 C) 98.1 F (36.7 C) 98.6 F (37 C)  TempSrc: Oral Oral Oral Oral  Resp: 30 30 28 26   Height:      Weight:      SpO2: 96% 99% 100% 100%    Intake/Output Summary (Last 24 hours) at 01/05/14 1126 Last data filed at 01/05/14 0537  Gross per 24 hour  Intake      0 ml  Output   1025 ml  Net  -1025 ml   Filed Weights   01/02/14 0452  Weight: 70.5 kg (155 lb 6.8 oz)     Exam:  General exam: Moderately built and nourished middle-aged male  lying comfortably in bed  Respiratory system:  poor inspiratory effort and slightly diminished breath sounds in the bases. Rest of lung fields clear to auscultation. No increased work of breathing. Cardiovascular  system: S1 & S2 heard, RRR. No JVD, murmurs, gallops, clicks or pedal edema. Gastrointestinal system: Abdomen is mildly distended but much improved compared to yesterday, soft and nontender. Normal bowel sounds heard. PEG tube site intact.  Central nervous system: Alert and  follow some instructions. Aphasic. No focal neurological deficits. Extremities:  contractures of upper extremities >lower extremities but able to move all.   Data Reviewed: Basic Metabolic Panel:  Recent Labs Lab 01/02/14 0033 01/02/14 0730 01/03/14 0224  NA 137 138 141  K 4.0 3.9 3.8  CL 96 102 104  CO2 26 24 25   GLUCOSE 120* 163* 114*  BUN 20 16 18   CREATININE 0.65 0.55 0.49*  CALCIUM 9.2 9.1 8.5  MG  --   --  2.0  PHOS  --   --  1.9*   Liver Function Tests:  Recent Labs Lab 01/02/14 0033 01/02/14 0730  AST 33 23  ALT 11 8  ALKPHOS 121* 114  BILITOT 0.4 0.4  PROT 8.7* 8.4*  ALBUMIN 3.2* 3.0*   No results found for this basename: LIPASE, AMYLASE,  in the last 168 hours  Recent Labs Lab 01/02/14 0730  AMMONIA 34   CBC:  Recent Labs Lab 01/02/14 0033 01/02/14 0730 01/03/14 0224 01/04/14 0839 01/05/14 0330  WBC 8.4 9.3 10.3 7.6 7.3  NEUTROABS 5.6 7.8*  --   --   --   HGB 9.9* 9.3* 8.2* 8.3* 8.6*  HCT 31.2* 29.4* 25.4* 25.7* 27.1*  MCV 74.3* 76.2* 74.9* 73.4* 75.7*  PLT 316 282 233 299 310   Cardiac Enzymes: No results found for this basename: CKTOTAL, CKMB, CKMBINDEX, TROPONINI,  in the last 168 hours BNP (last 3 results) No results found for this basename: PROBNP,  in the last 8760 hours CBG:  Recent Labs Lab 01/03/14 0336 01/03/14 0801 01/03/14 1120 01/03/14 1647 01/03/14 2103  GLUCAP 100* 113* 135* 109* 105*    Recent Results (from the past 240 hour(s))  CULTURE, BLOOD (ROUTINE X 2)     Status: None   Collection Time    01/02/14 12:33 AM      Result Value Ref Range Status   Specimen Description BLOOD RIGHT HAND   Final   Special Requests BOTTLES DRAWN AEROBIC AND  ANAEROBIC 5CC   Final   Culture  Setup Time     Final   Value: 01/02/2014 04:02     Performed at [facility]   Culture     Final   Value:        BLOOD CULTURE RECEIVED NO GROWTH TO DATE CULTURE WILL BE HELD FOR 5 DAYS BEFORE ISSUING A FINAL NEGATIVE REPORT     Performed at [facility]   Report Status PENDING   Incomplete  CULTURE, BLOOD (ROUTINE X 2)     Status: None   Collection Time    01/02/14 12:33 AM      Result Value Ref Range Status   Specimen Description BLOOD LEFT HAND   Final   Special Requests BOTTLES DRAWN AEROBIC AND ANAEROBIC 5CC   Final   Culture  Setup Time     Final   Value: 01/02/2014 04:03     Performed at [facility]   Culture     Final   Value:  BLOOD CULTURE RECEIVED NO GROWTH TO DATE CULTURE WILL BE HELD FOR 5 DAYS BEFORE ISSUING A FINAL NEGATIVE REPORT     Performed at [facility]   Report Status PENDING   Incomplete  URINE CULTURE     Status: None   Collection Time    01/02/14 12:35 AM      Result Value Ref Range Status   Specimen Description URINE, CATHETERIZED   Final   Special Requests Normal   Final   Culture  Setup Time     Final   Value: 01/02/2014 08:55     Performed at [facility] Count     Final   Value: >=100,000 COLONIES/ML     Performed at [facility]   Culture     Final   Value: PROTEUS MIRABILIS     Performed at [facility]   Report Status 01/04/2014 FINAL   Final   Organism ID, Bacteria PROTEUS MIRABILIS   Final  MRSA PCR SCREENING     Status: None   Collection Time    01/02/14  3:59 AM      Result Value Ref Range Status   MRSA by PCR NEGATIVE  NEGATIVE Final   Comment:            The GeneXpert MRSA Assay (FDA     approved for NASAL specimens     only), is one component of a     comprehensive MRSA colonization     surveillance program. It is not     intended to diagnose MRSA     infection nor to guide or     monitor treatment for     MRSA  infections.          Studies: Dg Chest Port 1 View  01/05/2014   CLINICAL DATA:  Persistent cough  EXAM: PORTABLE CHEST - 1 VIEW  COMPARISON:  January 02, 2014 and June 20, 2013  FINDINGS: There is diffuse interstitial prominence without airspace consolidation. Heart is upper normal in size with pulmonary vascularity within normal limits. No adenopathy. No bone lesions.  IMPRESSION: Persistent diffuse interstitial prominence. Suspect a degree of chronic congestive heart failure. No consolidation. Apparent stable compared to recent prior study.   Electronically Signed   By: Bretta Bang M.D.   On: 01/05/2014 09:48   Dg Abd Portable 1v  01/05/2014   CLINICAL DATA:  Fecal impaction in rectum  EXAM: PORTABLE ABDOMEN - 1 VIEW  COMPARISON:  01/04/2014  FINDINGS: A gastric feeding tube again noted. There is nonspecific nonobstructive bowel gas pattern. There is some residual stool within rectum with improvement from prior exam. Rectum measures 5 cm in diameter. Persistent some gaseous distension of sigmoid colon.  IMPRESSION: Nonobstructive small bowel bowel gas pattern. Residual stool within rectum with improvement from prior exam. Rectum measures about 5 cm in diameter. Persistent mild gaseous distension of sigmoid colon.   Electronically Signed   By: Natasha Mead M.D.   On: 01/05/2014 10:31   Dg Abd Portable 1v  01/04/2014   CLINICAL DATA:  Initial encounter for abdominal distention.  EXAM: PORTABLE ABDOMEN - 1 VIEW  COMPARISON:  One-view abdomen 01/02/2014.  FINDINGS: A gastrostomy tube is stable in appearance. Gas is present within the stomach. A distended loop of bowel is noted in the left lower quadrant. A moderate amount of stool is again seen within the rectum. The bowel gas pattern is otherwise unremarkable. The visualized soft tissues and bony  thorax are unremarkable.  IMPRESSION: 1. Distended loop of bowel in the left lower quadrant appears to be colon. This may represent a partial  obstruction. Moderate stool is present in the rectum. This could be secondary to impaction. 2. The more proximal bowel is unremarkable. 3. Stable positioning of the gastrostomy tube.   Electronically Signed   By: Gennette Pac M.D.   On: 01/04/2014 09:23        Scheduled Meds: . antiseptic oral rinse  7 mL Mouth Rinse q12n4p  . baclofen  10 mg Per Tube TID  . cefTRIAXone (ROCEPHIN)  IV  1 g Intravenous Q24H  . chlorhexidine  15 mL Mouth Rinse BID  . feeding supplement (PRO-STAT SUGAR FREE 64)  30 mL Per Tube TID  . ferrous sulfate  300 mg Per Tube q morning - 10a  . fludrocortisone  0.1 mg Per Tube Daily  . folic acid  1 mg Per Tube q morning - 10a  . free water  200 mL Per Tube 3 times per day  . olopatadine  1 drop Both Eyes q morning - 10a  . phenytoin  125 mg Per Tube BID  . polyvinyl alcohol  1 drop Both Eyes Daily  . saccharomyces boulardii  250 mg Oral BID  . senna  2 tablet Per Tube Daily   Continuous Infusions: . feeding supplement (OSMOLITE 1.5 CAL) 1,000 mL (01/04/14 1654)    Principal Problem:   Subdural hematoma Active Problems:   Adrenal insufficiency   UTI (lower urinary tract infection)   Acute encephalopathy   Acute respiratory failure with hypercapnia   Chronic anemia   Fecal impaction    Time spent: 20 minutes.    Marcellus Scott, MD, FACP, FHM. [facility] Pager [phone]  If 7PM-7AM, please contact night-coverage [email or web address] Password [email or web address] 01/05/2014, 11:26 AM    LOS: 4 days

## 2014-01-06 ENCOUNTER — Encounter (HOSPITAL_COMMUNITY): Payer: Self-pay | Admitting: Radiology

## 2014-01-06 ENCOUNTER — Inpatient Hospital Stay (HOSPITAL_COMMUNITY): Payer: Medicare Other

## 2014-01-06 DIAGNOSIS — D649 Anemia, unspecified: Secondary | ICD-10-CM

## 2014-01-06 MED ORDER — PRO-STAT SUGAR FREE PO LIQD: 30.0000 mL | Freq: Three times a day (TID) | ORAL | Status: DC

## 2014-01-06 MED ORDER — ACETAMINOPHEN 325 MG PO TABS: 650.0000 mg | ORAL_TABLET | Freq: Four times a day (QID) | ORAL | Status: DC | PRN

## 2014-01-06 MED ORDER — CEFUROXIME AXETIL 500 MG PO TABS: 500.0000 mg | ORAL_TABLET | Freq: Two times a day (BID) | ORAL | Status: DC

## 2014-01-06 MED ORDER — TRAMADOL HCL 50 MG PO TABS: 50.0000 mg | ORAL_TABLET | Freq: Four times a day (QID) | ORAL | Status: DC | PRN

## 2014-01-06 MED ORDER — FREE WATER: 200.0000 mL | Freq: Three times a day (TID) | Status: DC

## 2014-01-06 MED ORDER — SENNA 8.6 MG PO TABS: 2.0000 | ORAL_TABLET | Freq: Every day | ORAL | Status: DC

## 2014-01-06 NOTE — Discharge Summary (Signed)
Physician Discharge Summary  Cobi Tafel VWU:[id number] DOB: 07/08/1955 DOA: 01/01/2014  PCP: [name], MD  Admit date: 01/01/2014 Discharge date: 01/06/2014  Time spent: Greater than 30 minutes  Recommendations for Outpatient Follow-up:  1. Dr. Baltazar Najjar, PCP in 3 days with repeat labs (CBC). Please follow final blood culture results that were sent from the hospital. 2. Dr. Shirlean Kelly, neurosurgery in one month. To be seen with a CT scan head on the day of the appointment or a few days before the appointment. Dr. Earl Gala secretary, Lawanna Kobus [phone], [phone]), should be able to coordinate this with the patient's parents and the staff at the skilled nursing facility. 3. NPO. Patient on tube feeds 4. Do not use antiplatelet or anticoagulant agents secondary to subdural hematoma.  Discharge Diagnoses:  Principal Problem:   Subdural hematoma Active Problems:   Adrenal insufficiency   UTI (lower urinary tract infection)   Acute encephalopathy   Acute respiratory failure with hypercapnia   Chronic anemia   Fecal impaction   Discharge Condition: Improved & Stable  Diet recommendation: Tube feeds. N.p.o.  Filed Weights   01/02/14 0452  Weight: 70.5 kg (155 lb 6.8 oz)    History of present illness:  58 year old male, SNF resident with history of aphasia and spastic quadriparesis, PEG tube, seizures, chronic anemia, chronic kidney disease, presented to the ED with increasing drowsiness & foul-smelling urine. He was started on antibiotics for presumed UTI by his M.D. at SNF despite which his mental status did not improve. In the ED, ABG showed mild hypercarbic respiratory failure, UA suggested possible UTI, mildly febrile and drowsy on exam. CT head showed acute on chronic subdural hematoma with mass shift. Neurosurgery and CCM were consulted and patient was admitted to ICU.   Hospital Course:   1. Chronic left subdural hematoma with mild mass effect and shift:  Neurosurgery consulted and patient underwent serial CT heads x2 without significant change. Dr. Newell Coral, Neurosurgery input much appreciated-he has evaluated extensively and discussed in detail with family on multiple occasions. Patient's mental status has returned to baseline. The benefits and risks of continued observation as we are doing now versus surgical route were discussed at length by neurosurgery with the family and family opted conservative/observation management. On 01/06/14, patient's mother stated that patient was complaining of headache and pain in his eyes. Discussed with neurosurgery and obtained repeat head CT. Discussed with Dr. Newell Coral who has reviewed the CT scan and indicates that the subdural hematoma continues to be stable and has cleared patient for discharge. No antiplatelet or anticoagulants. Followup with neurosurgery in one month with repeat CT head. 2. Acute hypercarbic respiratory failure/atelectasis: Mild. Unclear etiology. Resolved after brief BiPAP. BiPAP when necessary. Pulmonary hygiene. 3. S/P PEG tube: Tube feedings ongoing without residuals.  4. Proteus mirabilis UTI: Received a single dose of cefepime and vancomycin on 10/7 and none since. Patient had not received antibiotics after initial dose. Started IV Rocephin on 10/10 based on sensitivity results. Changed to oral Ceftin at discharge and complete on 01/10/14. Blood cultures x2: Negative to date 5. Chronic anemia: Stable. Outpatient followup and evaluation as deemed necessary. 6. Chronic aphasia and quadriparesis: At baseline 7. History of adrenal insufficiency: On chronic fludrocortisone. 8. Partial bowel obstruction/fecal impaction: Patient had a good BM after enema on 10/10 with improvement in abdominal signs. X-ray looks better. Start bowel regimen and follow clinically. Patient had another small BMs yesterday. 9. History of seizures: Continue phenytoin   Consultations:  Neurosurgery  Critical care  medicine  Procedures:  None    Discharge Exam:  Complaints:  As per patient's mother at bedside, patient complaining of global headache and eye pain this morning. Patient denies chest pain, abdominal pain or dyspnea. As per nursing, had small BMs yesterday otherwise no acute events. Tolerating tube feeds.  Filed Vitals:   01/05/14 2143 01/06/14 0301 01/06/14 0601 01/06/14 0951  BP: 115/67 118/62 116/59 119/61  Pulse: 90 97 108 83  Temp: 98.4 F (36.9 C) 98.1 F (36.7 C) 97.9 F (36.6 C) 98.4 F (36.9 C)  TempSrc: Oral Oral Oral   Resp: 26 40 34 28  Height:      Weight:      SpO2: 99% 99% 97% 97%   General exam: Moderately built and nourished middle-aged male lying comfortably in bed. Patient is able to communicate with parents by grunting which parents understand but not the staff or M.D. Respiratory system: poor inspiratory effort and slightly diminished breath sounds in the bases. Rest of lung fields clear to auscultation. No increased work of breathing.  Cardiovascular system: S1 & S2 heard, RRR. No JVD, murmurs, gallops, clicks or pedal edema.  Gastrointestinal system: Abdomen is not distended, soft and nontender. Normal bowel sounds heard. PEG tube site intact.  Central nervous system: Alert and follows instructions. Aphasic. No focal neurological deficits.  Extremities: contractures of upper extremities >lower extremities but able to move all.   Discharge Instructions      Discharge Instructions   Call MD for:  severe uncontrolled pain    Complete by:  As directed      Call MD for:  temperature >100.4    Complete by:  As directed      Call MD for:    Complete by:  As directed   Altered mental status.     Discharge instructions    Complete by:  As directed   NPO. Patient on tube feeds.     Increase activity slowly    Complete by:  As directed             Medication List    STOP taking these medications       acetaminophen 650 MG suppository  Commonly  known as:  TYLENOL  Replaced by:  acetaminophen 325 MG tablet     nitrofurantoin (macrocrystal-monohydrate) 100 MG capsule  Commonly known as:  MACROBID      TAKE these medications       acetaminophen 325 MG tablet  Commonly known as:  TYLENOL  Place 2 tablets (650 mg total) into feeding tube every 6 (six) hours as needed for mild pain, fever or headache.     baclofen 10 MG tablet  Commonly known as:  LIORESAL  Give 10 mg by tube 3 (three) times daily.     bisacodyl 10 MG suppository  Commonly known as:  DULCOLAX  Place 10 mg rectally as needed for mild constipation or moderate constipation.     cefUROXime 500 MG tablet  Commonly known as:  CEFTIN  Place 1 tablet (500 mg total) into feeding tube 2 (two) times daily with a meal. Discontinue after 01/10/14 doses.     cetirizine 5 MG tablet  Commonly known as:  ZYRTEC  5 mg by PEG Tube route every morning.     feeding supplement (PRO-STAT SUGAR FREE 64) Liqd  Place 30 mLs into feeding tube 3 (three) times daily.     ferrous sulfate 220 (44 FE) MG/5ML solution  330 mg by PEG Tube  route every morning.     fludrocortisone 0.1 MG tablet  Commonly known as:  FLORINEF  0.1 mg by PEG Tube route every evening. Adrenocortical Insufficiency     folic acid 1 MG tablet  Commonly known as:  FOLVITE  1 mg by PEG Tube route every morning.     free water Soln  Place 200 mLs into feeding tube every 8 (eight) hours.     olopatadine 0.1 % ophthalmic solution  Commonly known as:  PATANOL  Place 1 drop into both eyes every morning.     phenytoin 125 MG/5ML suspension  Commonly known as:  DILANTIN  Place 125 mg into feeding tube 2 (two) times daily.     polyvinyl alcohol 1.4 % ophthalmic solution  Commonly known as:  LIQUIFILM TEARS  Place 1 drop into both eyes daily.     RA SALINE ENEMA RE  Place 1 enema rectally once. If no relief from bisacodyl     saccharomyces boulardii 250 MG capsule  Commonly known as:  FLORASTOR  Give  250 mg by tube 2 (two) times daily.     senna 8.6 MG Tabs tablet  Commonly known as:  SENOKOT  Place 2 tablets (17.2 mg total) into feeding tube daily.     traMADol 50 MG tablet  Commonly known as:  ULTRAM  Place 1 tablet (50 mg total) into feeding tube every 6 (six) hours as needed for moderate pain.     TWOCAL HN Liqd  Give 1 Can by tube continuous. Run@ 55cc/hr for 20 hrs. Turn off pump 1 hour before and keep off until 1 hour after phenytoin dose       Follow-up Information   Follow up with [name], MD. Schedule an appointment as soon as possible for a visit in 3 days. (To be seen with repeat labs (CBC).)    Specialty:  Internal Medicine   Contact information:   [address] [place] [address] [phone]       Follow up with Hewitt Shorts, MD. Schedule an appointment as soon as possible for a visit in 1 month. (To be seen with CT scan Head on the day of the appointment or a few days before the appointment. Dr. Earl Gala secretary Lawanna Kobus [phone], [phone]), should be able to coordinate this with the patient's parents and the staff at the skilled nursing facility.)    Specialty:  Neurosurgery   Contact information:   [address] [place] [address] [phone]        The results of significant diagnostics from this hospitalization (including imaging, microbiology, ancillary and laboratory) are listed below for reference.    Significant Diagnostic Studies: Ct Head Wo Contrast  01/06/2014   CLINICAL DATA:  58 year old male with left subdural hematoma. Initial encounter.  EXAM: CT HEAD WITHOUT CONTRAST  TECHNIQUE: Contiguous axial images were obtained from the base of the skull through the vertex without intravenous contrast.  COMPARISON:  01/03/2014 and earlier.  FINDINGS: No acute osseous abnormality identified. No acute orbit or scalp soft tissue findings. Stable minor paranasal sinus mucosal thickening. Calcified atherosclerosis at  the skull base.  Mixed density left subdural hematoma persists measuring up to 12-13 mm in thickness at the level of the operculum, and has not significantly changed since 01/02/2014.  Suggestion of a much smaller right vertex subdural hematoma measuring up to 5 mm (series 2, image 22). This was not evident on earlier exams, but is low density.  Rightward midline  shift of 10 mm is stable. Stable ventricles. Basilar cisterns remain patent. No intra-axial or intraventricular hemorrhage. Stable gray-white matter differentiation throughout the brain. Age advanced cerebral volume loss. Age advanced bilateral external capsule hypodensity. No evidence of cortically based acute infarction identified.  IMPRESSION: 1. Mixed density left subdural hematoma is stable since 01/02/2014, measuring up to 12 to 13 mm thickness at the level of the operculum. 2. Questionable small or trace right side subdural at the vertex, 5 mm. 3. Mass effect is stable, with rightward midline shift of 10 mm.   Electronically Signed   By: Augusto Gamble M.D.   On: 01/06/2014 11:08   Ct Head Wo Contrast  01/03/2014   CLINICAL DATA:  Followup subdural hematoma.  EXAM: CT HEAD WITHOUT CONTRAST  TECHNIQUE: Contiguous axial images were obtained from the base of the skull through the vertex without intravenous contrast.  COMPARISON:  Prior CT from 01/02/2014  FINDINGS: Previously identified acute on chronic left subdural hematoma again seen. This collection is not significantly changed in size measuring up to 1.3 cm in transverse diameter at the level of the left frontal lobe. Again, this measures 3 cm at the vertex. The acute blood components are slightly different in location, but overall likely not significantly changed in volume with no definite new bleeding. Left-to-right midline shift at the level of the septum pellucidum measures approximately 1.1 cm, likely not significantly changed. Ventricle size is stable without hydrocephalus.  Parenchymal volume  loss with small vessel disease again noted. Remote ischemic infarcts within the bilateral external capsules again noted. No acute large vessel territory infarct.  Calvarium and scalp soft tissues within normal limits. No acute abnormality seen about either orbit. Paranasal sinuses and mastoid air cells are clear.  IMPRESSION: 1. Stable size of acute on chronic left subdural hematoma measuring up to 1.3 cm in thickness with similar left-to-right midline shift of 1.1 cm. 2. No new intracranial abnormality.   Electronically Signed   By: Rise Mu M.D.   On: 01/03/2014 07:28   Ct Head Wo Contrast  01/02/2014   CLINICAL DATA:  Acute onset of fever. Acute encephalopathy. Personal history of seizures.  EXAM: CT HEAD WITHOUT CONTRAST  TECHNIQUE: Contiguous axial images were obtained from the base of the skull through the vertex without intravenous contrast.  COMPARISON:  CT of the head performed 04/27/2004  FINDINGS: There is a large left-sided acute on chronic subdural hematoma. This measures approximately 1.3 cm in thickness at the level of the lateral ventricles. At the vertex, it measures up to 3 cm, though this reflects the plane of imaging. Scattered foci of acute blood are noted within the hematoma. There is marked associated mass effect, with approximately 1.4 cm of rightward midline shift.  Underlying moderate cortical volume loss is noted. Cerebellar atrophy is seen. Scattered periventricular white matter change likely reflects small vessel ischemic microangiopathy. Chronic ischemic change is noted with regard to the external capsule bilaterally.  The brainstem and fourth ventricle are within normal limits.  There is no evidence of fracture; visualized osseous structures are unremarkable in appearance. The visualized portions of the orbits are within normal limits. The paranasal sinuses and mastoid air cells are well-aerated. No significant soft tissue abnormalities are seen.  IMPRESSION: 1. Large  left-sided acute on chronic subdural hematoma, measuring 1.3 cm in thickness at the level of the lateral ventricles. It measures up to 3 cm at the vertex, though this reflects the plane of imaging. Scattered foci of acute blood noted  within the hematoma. Marked associated mass effect, with 1.4 cm of rightward midline shift. 2. Moderate cortical volume loss and scattered small vessel ischemic microangiopathy. Chronic ischemic change noted with regard to the external capsule bilaterally.  Critical Value/emergent results were called by telephone at the time of interpretation on 01/02/2014 at 3:57 am to Dr. Midge Minium, who verbally acknowledged these results.   Electronically Signed   By: Roanna Raider M.D.   On: 01/02/2014 04:00   Ct Lumbar Spine Wo Contrast  01/02/2014   CLINICAL DATA:  Back pain.  No injury.  History of quadriplegia.  EXAM: CT LUMBAR SPINE WITHOUT CONTRAST  TECHNIQUE: Multidetector CT imaging of the lumbar spine was performed without intravenous contrast administration. Multiplanar CT image reconstructions were also generated.  COMPARISON:  Prior CT from 06/22/2013  FINDINGS: Vertebral bodies are normally aligned with preservation of the normal lumbar lordosis. Osteopenia noted.  Chronic anterior height loss at the superior endplate of T12 with associated degenerative endplate Schmorl's node is stable from prior study. Minimal height loss at T11 also unchanged. There is increased central height loss at the superior endplate of L5 as compared to prior CT from 06/22/2013. No discrete fracture line is seen. Otherwise, vertebral body heights are preserved.  Vertebral bodies are normally aligned with preservation of the normal lumbar lordosis.  The conus medullaris terminates at a normal level, and is grossly unremarkable.  Fatty atrophy noted within the paraspinous musculature. No acute soft tissue abnormality.  At T10-11 through L1-2, there is no disc bulge or disc protrusion. No significant  canal or foraminal stenosis.  L2-3: There is mild disc bulge with bilateral facet hypertrophy. No focal disc protrusion. No canal or foraminal stenosis.  L3-4: There is mild diffuse disc bulge with bilateral facet hypertrophy and ligamentum flavum thickening. No significant canal or foraminal stenosis. No focal disc protrusion.  L4-5: There is diffuse disc bulging. There is a superimposed right foraminal disc protrusion without definite nerve root impingement (series 3, image 109). Bilateral facet hypertrophy with ligamentum flavum thickening present. There is resultant mild canal stenosis. Mild bilateral foraminal narrowing.  L5-S1 mild bilateral facet hypertrophy with mild diffuse disc bulge. No canal or foraminal stenosis.  IMPRESSION: 1. Increased central height loss at the superior endplate of L5 as compared to most recent CT from 11/13/2013. No definite discrete fracture line identified. Correlation with MRI could be performed if there is clinical concern for possible occult compression fracture at this level. 2. Stable anterior wedging of the T11 and T12 vertebral bodies. 3. Degenerative disc bulge with superimposed right foraminal disc protrusion at L4-5 with resultant mild canal bilateral foraminal stenosis. 4. Additional mild degenerative changes as above.   Electronically Signed   By: Rise Mu M.D.   On: 01/02/2014 04:24   Dg Chest Port 1 View  01/05/2014   CLINICAL DATA:  Persistent cough  EXAM: PORTABLE CHEST - 1 VIEW  COMPARISON:  January 02, 2014 and June 20, 2013  FINDINGS: There is diffuse interstitial prominence without airspace consolidation. Heart is upper normal in size with pulmonary vascularity within normal limits. No adenopathy. No bone lesions.  IMPRESSION: Persistent diffuse interstitial prominence. Suspect a degree of chronic congestive heart failure. No consolidation. Apparent stable compared to recent prior study.   Electronically Signed   By: Bretta Bang M.D.    On: 01/05/2014 09:48   Dg Chest Port 1 View  01/02/2014   CLINICAL DATA:  Fever beginning yesterday. Shortness of breath and wheezing. Initial encounter.  EXAM: PORTABLE CHEST - 1 VIEW  COMPARISON:  One-view chest 06/20/2013.  FINDINGS: The patient is rotated to the right. The heart size is upper limits of normal. This is exaggerated by low lung volumes. Right peritracheal soft tissue prominence is likely vascular and projectional. Chronic interstitial coarsening is evident. The right hemidiaphragm is further elevated with asymmetric subsegmental atelectasis at the right base.  IMPRESSION: 1. Low lung volumes with new elevation of the right hemidiaphragm. 2. Bibasilar subsegmental atelectasis, right greater than left. 3. Chronic interstitial coarsening.   Electronically Signed   By: Gennette Pac M.D.   On: 01/02/2014 00:36   Dg Abd Portable 1v  01/05/2014   CLINICAL DATA:  Fecal impaction in rectum  EXAM: PORTABLE ABDOMEN - 1 VIEW  COMPARISON:  01/04/2014  FINDINGS: A gastric feeding tube again noted. There is nonspecific nonobstructive bowel gas pattern. There is some residual stool within rectum with improvement from prior exam. Rectum measures 5 cm in diameter. Persistent some gaseous distension of sigmoid colon.  IMPRESSION: Nonobstructive small bowel bowel gas pattern. Residual stool within rectum with improvement from prior exam. Rectum measures about 5 cm in diameter. Persistent mild gaseous distension of sigmoid colon.   Electronically Signed   By: Natasha Mead M.D.   On: 01/05/2014 10:31   Dg Abd Portable 1v  01/04/2014   CLINICAL DATA:  Initial encounter for abdominal distention.  EXAM: PORTABLE ABDOMEN - 1 VIEW  COMPARISON:  One-view abdomen 01/02/2014.  FINDINGS: A gastrostomy tube is stable in appearance. Gas is present within the stomach. A distended loop of bowel is noted in the left lower quadrant. A moderate amount of stool is again seen within the rectum. The bowel gas pattern is  otherwise unremarkable. The visualized soft tissues and bony thorax are unremarkable.  IMPRESSION: 1. Distended loop of bowel in the left lower quadrant appears to be colon. This may represent a partial obstruction. Moderate stool is present in the rectum. This could be secondary to impaction. 2. The more proximal bowel is unremarkable. 3. Stable positioning of the gastrostomy tube.   Electronically Signed   By: Gennette Pac M.D.   On: 01/04/2014 09:23   Dg Abd Portable 1v  01/02/2014   CLINICAL DATA:  Gastrostomy tube.  Initial evaluation.  EXAM: PORTABLE ABDOMEN - 1 VIEW  COMPARISON:  CT 11/13/2013.  FINDINGS: What appears to be a gastrostomy tube is noted projected over left upper quadrant. Stomach is not distended. No significant bowel distention. Stool in rectum. Calcified pelvic densities consistent with phleboliths.  IMPRESSION: What appears to be a gastrostomy tube is noted projected over left upper quadrant over the stomach. No gastric distention.   Electronically Signed   By: Maisie Fus  Register   On: 01/02/2014 10:44    Microbiology: Recent Results (from the past 240 hour(s))  CULTURE, BLOOD (ROUTINE X 2)     Status: None   Collection Time    01/02/14 12:33 AM      Result Value Ref Range Status   Specimen Description BLOOD RIGHT HAND   Final   Special Requests BOTTLES DRAWN AEROBIC AND ANAEROBIC 5CC   Final   Culture  Setup Time     Final   Value: 01/02/2014 04:02     Performed at [facility]   Culture     Final   Value:        BLOOD CULTURE RECEIVED NO GROWTH TO DATE CULTURE WILL BE HELD FOR 5 DAYS BEFORE ISSUING A FINAL NEGATIVE  REPORT     Performed at [facility]   Report Status PENDING   Incomplete  CULTURE, BLOOD (ROUTINE X 2)     Status: None   Collection Time    01/02/14 12:33 AM      Result Value Ref Range Status   Specimen Description BLOOD LEFT HAND   Final   Special Requests BOTTLES DRAWN AEROBIC AND ANAEROBIC 5CC   Final   Culture  Setup Time      Final   Value: 01/02/2014 04:03     Performed at [facility]   Culture     Final   Value:        BLOOD CULTURE RECEIVED NO GROWTH TO DATE CULTURE WILL BE HELD FOR 5 DAYS BEFORE ISSUING A FINAL NEGATIVE REPORT     Performed at [facility]   Report Status PENDING   Incomplete  URINE CULTURE     Status: None   Collection Time    01/02/14 12:35 AM      Result Value Ref Range Status   Specimen Description URINE, CATHETERIZED   Final   Special Requests Normal   Final   Culture  Setup Time     Final   Value: 01/02/2014 08:55     Performed at [facility] Count     Final   Value: >=100,000 COLONIES/ML     Performed at [facility]   Culture     Final   Value: PROTEUS MIRABILIS     Performed at [facility]   Report Status 01/04/2014 FINAL   Final   Organism ID, Bacteria PROTEUS MIRABILIS   Final  MRSA PCR SCREENING     Status: None   Collection Time    01/02/14  3:59 AM      Result Value Ref Range Status   MRSA by PCR NEGATIVE  NEGATIVE Final   Comment:            The GeneXpert MRSA Assay (FDA     approved for NASAL specimens     only), is one component of a     comprehensive MRSA colonization     surveillance program. It is not     intended to diagnose MRSA     infection nor to guide or     monitor treatment for     MRSA infections.     Labs: Basic Metabolic Panel:  Recent Labs Lab 01/02/14 0033 01/02/14 0730 01/03/14 0224  NA 137 138 141  K 4.0 3.9 3.8  CL 96 102 104  CO2 26 24 25   GLUCOSE 120* 163* 114*  BUN 20 16 18   CREATININE 0.65 0.55 0.49*  CALCIUM 9.2 9.1 8.5  MG  --   --  2.0  PHOS  --   --  1.9*   Liver Function Tests:  Recent Labs Lab 01/02/14 0033 01/02/14 0730  AST 33 23  ALT 11 8  ALKPHOS 121* 114  BILITOT 0.4 0.4  PROT 8.7* 8.4*  ALBUMIN 3.2* 3.0*   No results found for this basename: LIPASE, AMYLASE,  in the last 168 hours  Recent Labs Lab 01/02/14 0730  AMMONIA 34    CBC:  Recent Labs Lab 01/02/14 0033 01/02/14 0730 01/03/14 0224 01/04/14 0839 01/05/14 0330  WBC 8.4 9.3 10.3 7.6 7.3  NEUTROABS 5.6 7.8*  --   --   --   HGB 9.9* 9.3* 8.2* 8.3* 8.6*  HCT 31.2* 29.4* 25.4*  25.7* 27.1*  MCV 74.3* 76.2* 74.9* 73.4* 75.7*  PLT 316 282 233 299 310   Cardiac Enzymes: No results found for this basename: CKTOTAL, CKMB, CKMBINDEX, TROPONINI,  in the last 168 hours BNP: BNP (last 3 results) No results found for this basename: PROBNP,  in the last 8760 hours CBG:  Recent Labs Lab 01/03/14 0336 01/03/14 0801 01/03/14 1120 01/03/14 1647 01/03/14 2103  GLUCAP 100* 113* 135* 109* 105*       Signed:  Marcellus Scott, MD, FACP, FHM. [facility] Pager [phone]  If 7PM-7AM, please contact night-coverage [email or web address] Password [email or web address] 01/06/2014, 11:57 AM

## 2014-01-06 NOTE — Progress Notes (Signed)
Pt. Discharged today to [facility] skilled facility, report called to [employer], Therapist, sports. Pt. Ready for ambulance transfer. Discharged packet sent with pt at time of ambulance transfer.

## 2014-01-06 NOTE — Progress Notes (Signed)
Clinical Education officer, museum met with pt and pt's parents in reference to discharge plans to return to SNF, [facility]. Pt has been a long term care pt at [facility] for several years. Pt's mother expressed wishes for pt to return to [facility] upon discharge (pt agreed). [facility] admission director, Lexine Baton notified and discharge summary and PT evaluation faxed out. CSW prepared discharge packet and arranged [facility]AR transportation. Nurse notified to call report.   Glendon Axe, MSW, LCSWA [phone] 01/06/2014 4:28 PM

## 2014-01-06 NOTE — Care Management Note (Addendum)
  Page 1 of 1   01/06/2014     2:17:18 PM CARE MANAGEMENT NOTE 01/06/2014  Patient:  [name], [name]   Account Number:  [id number]  Date Initiated:  01/06/2014  Documentation initiated by:  Lorne Skeens  Subjective/Objective Assessment:   Patient was admitted with fever, UTI, SDH. Resides at [facility].     Action/Plan:   Will follow for discharge needs.   Anticipated DC Date:  01/06/2014   Anticipated DC Plan:  SKILLED NURSING FACILITY  In-house referral  Clinical Social Worker         Choice offered to / List presented to:             Status of service:  Completed, signed off Medicare Important Message given?  YES (If response is "NO", the following Medicare IM given date fields will be blank) Date Medicare IM given:  01/06/2014 Medicare IM given by:  Lorne Skeens Date Additional Medicare IM given:   Additional Medicare IM given by:    Discharge Disposition:    Per UR Regulation:  Reviewed for med. necessity/level of care/duration of stay  If discussed at [place] of Stay Meetings, dates discussed:    Comments:

## 2014-01-06 NOTE — Progress Notes (Signed)
Filed Vitals:   01/05/14 1826 01/05/14 2143 01/06/14 0301 01/06/14 0601  BP: 126/66 115/67 118/62 116/59  Pulse: 87 90 97 108  Temp: 97.9 F (36.6 C) 98.4 F (36.9 C) 98.1 F (36.7 C) 97.9 F (36.6 C)  TempSrc: Oral Oral Oral Oral  Resp: 24 26 40 34  Height:      Weight:      SpO2: 98% 99% 99% 97%    CBC  Recent Labs  01/04/14 0839 01/05/14 0330  WBC 7.6 7.3  HGB 8.3* 8.6*  HCT 25.7* 27.1*  PLT 299 310    Patient resting in bed comfortably, without complaints. Nursing staff and patient's parents note a relatively uneventful weekend. He apparently did require disimpaction and extensive out of work a couple days ago. He has been tolerating his tube feedings. He continues on antibiotics for his UTI.  On examination the patient is awake and alert, with grunting communication, understanding will to his mother, but not to the staff or me. He does follow commands briskly, including the distinction between left and right. He is moving all 4 extremities, but has a spastic quadriparesis.  Plan: Neurologically stable, and at baseline according to his parents. From a neurosurgical perspective I will need to see him back in about one month, with a CT scan on the day of the appointment or a few days before the appointment. My secretary, Glenard Haring [phone], [phone]), should be able to coordinate this with the patient's parents and the staff at the skilled nursing facility. Certainly he will need his general medical care continued, including full treatment of his UTI, and this is deferred to the triad hospitalist service and the medical staff at the skilled nursing facility.  Hosie Spangle, MD 01/06/2014, 7:51 AM

## 2014-01-07 ENCOUNTER — Other Ambulatory Visit: Payer: Self-pay | Admitting: *Deleted

## 2014-01-07 DIAGNOSIS — M6249 Contracture of muscle, multiple sites: Secondary | ICD-10-CM | POA: Diagnosis not present

## 2014-01-07 DIAGNOSIS — Z23 Encounter for immunization: Secondary | ICD-10-CM | POA: Diagnosis not present

## 2014-01-07 DIAGNOSIS — M6281 Muscle weakness (generalized): Secondary | ICD-10-CM | POA: Diagnosis not present

## 2014-01-07 MED ORDER — TRAMADOL HCL 50 MG PO TABS: 50.0000 mg | ORAL_TABLET | Freq: Four times a day (QID) | ORAL | Status: DC | PRN

## 2014-01-07 NOTE — Telephone Encounter (Signed)
[facility] 

## 2014-01-08 DIAGNOSIS — M6281 Muscle weakness (generalized): Secondary | ICD-10-CM | POA: Diagnosis not present

## 2014-01-08 DIAGNOSIS — M6249 Contracture of muscle, multiple sites: Secondary | ICD-10-CM | POA: Diagnosis not present

## 2014-01-08 LAB — CULTURE, BLOOD (ROUTINE X 2)
Culture: NO GROWTH
Culture: NO GROWTH

## 2014-01-09 ENCOUNTER — Emergency Department (HOSPITAL_COMMUNITY): Payer: Medicare Other

## 2014-01-09 ENCOUNTER — Inpatient Hospital Stay (HOSPITAL_COMMUNITY)
Admission: EM | Admit: 2014-01-09 | Discharge: 2014-01-17 | DRG: 871 | Payer: Medicare Other | Attending: Internal Medicine | Admitting: Internal Medicine

## 2014-01-09 ENCOUNTER — Encounter (HOSPITAL_COMMUNITY): Payer: Self-pay | Admitting: Emergency Medicine

## 2014-01-09 DIAGNOSIS — D72829 Elevated white blood cell count, unspecified: Secondary | ICD-10-CM | POA: Diagnosis not present

## 2014-01-09 DIAGNOSIS — I6203 Nontraumatic chronic subdural hemorrhage: Secondary | ICD-10-CM | POA: Diagnosis present

## 2014-01-09 DIAGNOSIS — J9602 Acute respiratory failure with hypercapnia: Secondary | ICD-10-CM | POA: Diagnosis not present

## 2014-01-09 DIAGNOSIS — A0472 Enterocolitis due to Clostridium difficile, not specified as recurrent: Secondary | ICD-10-CM

## 2014-01-09 DIAGNOSIS — R918 Other nonspecific abnormal finding of lung field: Secondary | ICD-10-CM | POA: Diagnosis not present

## 2014-01-09 DIAGNOSIS — Y95 Nosocomial condition: Secondary | ICD-10-CM | POA: Diagnosis present

## 2014-01-09 DIAGNOSIS — R1310 Dysphagia, unspecified: Secondary | ICD-10-CM | POA: Diagnosis present

## 2014-01-09 DIAGNOSIS — I9589 Other hypotension: Secondary | ICD-10-CM | POA: Diagnosis present

## 2014-01-09 DIAGNOSIS — F329 Major depressive disorder, single episode, unspecified: Secondary | ICD-10-CM | POA: Diagnosis present

## 2014-01-09 DIAGNOSIS — R509 Fever, unspecified: Secondary | ICD-10-CM | POA: Diagnosis not present

## 2014-01-09 DIAGNOSIS — R6520 Severe sepsis without septic shock: Secondary | ICD-10-CM | POA: Diagnosis present

## 2014-01-09 DIAGNOSIS — S065X0A Traumatic subdural hemorrhage without loss of consciousness, initial encounter: Secondary | ICD-10-CM | POA: Diagnosis not present

## 2014-01-09 DIAGNOSIS — E2740 Unspecified adrenocortical insufficiency: Secondary | ICD-10-CM | POA: Diagnosis present

## 2014-01-09 DIAGNOSIS — G40909 Epilepsy, unspecified, not intractable, without status epilepticus: Secondary | ICD-10-CM | POA: Diagnosis present

## 2014-01-09 DIAGNOSIS — A419 Sepsis, unspecified organism: Secondary | ICD-10-CM | POA: Diagnosis not present

## 2014-01-09 DIAGNOSIS — Z931 Gastrostomy status: Secondary | ICD-10-CM | POA: Diagnosis not present

## 2014-01-09 DIAGNOSIS — S065X9A Traumatic subdural hemorrhage with loss of consciousness of unspecified duration, initial encounter: Secondary | ICD-10-CM | POA: Diagnosis present

## 2014-01-09 DIAGNOSIS — G8250 Quadriplegia, unspecified: Secondary | ICD-10-CM

## 2014-01-09 DIAGNOSIS — R10819 Abdominal tenderness, unspecified site: Secondary | ICD-10-CM | POA: Diagnosis not present

## 2014-01-09 DIAGNOSIS — A047 Enterocolitis due to Clostridium difficile: Secondary | ICD-10-CM | POA: Diagnosis present

## 2014-01-09 DIAGNOSIS — R40241 Glasgow coma scale score 13-15: Secondary | ICD-10-CM | POA: Diagnosis not present

## 2014-01-09 DIAGNOSIS — R0989 Other specified symptoms and signs involving the circulatory and respiratory systems: Secondary | ICD-10-CM

## 2014-01-09 DIAGNOSIS — G9340 Encephalopathy, unspecified: Secondary | ICD-10-CM | POA: Diagnosis present

## 2014-01-09 DIAGNOSIS — N390 Urinary tract infection, site not specified: Secondary | ICD-10-CM | POA: Diagnosis not present

## 2014-01-09 DIAGNOSIS — R569 Unspecified convulsions: Secondary | ICD-10-CM

## 2014-01-09 DIAGNOSIS — Z833 Family history of diabetes mellitus: Secondary | ICD-10-CM | POA: Diagnosis not present

## 2014-01-09 DIAGNOSIS — D649 Anemia, unspecified: Secondary | ICD-10-CM | POA: Diagnosis present

## 2014-01-09 DIAGNOSIS — Z880 Allergy status to penicillin: Secondary | ICD-10-CM | POA: Diagnosis not present

## 2014-01-09 DIAGNOSIS — Z886 Allergy status to analgesic agent status: Secondary | ICD-10-CM

## 2014-01-09 DIAGNOSIS — R4701 Aphasia: Secondary | ICD-10-CM | POA: Diagnosis present

## 2014-01-09 DIAGNOSIS — J9691 Respiratory failure, unspecified with hypoxia: Secondary | ICD-10-CM

## 2014-01-09 DIAGNOSIS — A414 Sepsis due to anaerobes: Secondary | ICD-10-CM | POA: Diagnosis not present

## 2014-01-09 DIAGNOSIS — E871 Hypo-osmolality and hyponatremia: Secondary | ICD-10-CM | POA: Diagnosis not present

## 2014-01-09 DIAGNOSIS — Z8249 Family history of ischemic heart disease and other diseases of the circulatory system: Secondary | ICD-10-CM | POA: Diagnosis not present

## 2014-01-09 DIAGNOSIS — E876 Hypokalemia: Secondary | ICD-10-CM | POA: Diagnosis not present

## 2014-01-09 DIAGNOSIS — K6289 Other specified diseases of anus and rectum: Secondary | ICD-10-CM | POA: Diagnosis not present

## 2014-01-09 DIAGNOSIS — K5289 Other specified noninfective gastroenteritis and colitis: Secondary | ICD-10-CM | POA: Diagnosis not present

## 2014-01-09 DIAGNOSIS — R109 Unspecified abdominal pain: Secondary | ICD-10-CM | POA: Diagnosis not present

## 2014-01-09 DIAGNOSIS — J9600 Acute respiratory failure, unspecified whether with hypoxia or hypercapnia: Secondary | ICD-10-CM | POA: Diagnosis not present

## 2014-01-09 DIAGNOSIS — S065XAA Traumatic subdural hemorrhage with loss of consciousness status unknown, initial encounter: Secondary | ICD-10-CM

## 2014-01-09 DIAGNOSIS — K529 Noninfective gastroenteritis and colitis, unspecified: Secondary | ICD-10-CM

## 2014-01-09 DIAGNOSIS — I6200 Nontraumatic subdural hemorrhage, unspecified: Secondary | ICD-10-CM | POA: Diagnosis not present

## 2014-01-09 DIAGNOSIS — R079 Chest pain, unspecified: Secondary | ICD-10-CM | POA: Diagnosis not present

## 2014-01-09 DIAGNOSIS — G20 Parkinson's disease: Secondary | ICD-10-CM | POA: Diagnosis present

## 2014-01-09 LAB — COMPREHENSIVE METABOLIC PANEL
ALBUMIN: 3 g/dL — AB (ref 3.5–5.2)
ALT: 10 U/L (ref 0–53)
ANION GAP: 14 (ref 5–15)
AST: 23 U/L (ref 0–37)
Alkaline Phosphatase: 152 U/L — ABNORMAL HIGH (ref 39–117)
BUN: 14 mg/dL (ref 6–23)
CO2: 24 mEq/L (ref 19–32)
Calcium: 9 mg/dL (ref 8.4–10.5)
Chloride: 94 mEq/L — ABNORMAL LOW (ref 96–112)
Creatinine, Ser: 0.56 mg/dL (ref 0.50–1.35)
GFR calc Af Amer: >90 mL/min (ref >90)
GFR calc non Af Amer: >90 mL/min (ref >90)
Glucose, Bld: 93 mg/dL (ref 70–99)
POTASSIUM: 3.7 meq/L (ref 3.7–5.3)
SODIUM: 132 meq/L — AB (ref 137–147)
TOTAL PROTEIN: 8.3 g/dL (ref 6.0–8.3)
Total Bilirubin: 0.3 mg/dL (ref 0.3–1.2)

## 2014-01-09 LAB — URINALYSIS, ROUTINE W REFLEX MICROSCOPIC
Bilirubin Urine: NEGATIVE
Glucose, UA: NEGATIVE mg/dL
Ketones, ur: NEGATIVE mg/dL
Leukocytes, UA: NEGATIVE
NITRITE: NEGATIVE
PH: 5.5 (ref 5.0–8.0)
Protein, ur: 30 mg/dL — AB
SPECIFIC GRAVITY, URINE: 1.025 (ref 1.005–1.030)
Urobilinogen, UA: 0.2 mg/dL (ref 0.0–1.0)

## 2014-01-09 LAB — CBC
HCT: 31.2 % — ABNORMAL LOW (ref 39.0–52.0)
Hemoglobin: 10.3 g/dL — ABNORMAL LOW (ref 13.0–17.0)
MCH: 23.9 pg — ABNORMAL LOW (ref 26.0–34.0)
MCHC: 33 g/dL (ref 30.0–36.0)
MCV: 72.4 fL — ABNORMAL LOW (ref 78.0–100.0)
Platelets: 344 10*3/uL (ref 150–400)
RBC: 4.31 MIL/uL (ref 4.22–5.81)
RDW: 13.3 % (ref 11.5–15.5)
WBC: 8.3 10*3/uL (ref 4.0–10.5)

## 2014-01-09 LAB — URINE MICROSCOPIC-ADD ON

## 2014-01-09 LAB — LACTIC ACID, PLASMA: Lactic Acid, Venous: 1.3 mmol/L (ref 0.5–2.2)

## 2014-01-09 MED ORDER — SODIUM CHLORIDE 0.9 % IV SOLN
INTRAVENOUS | Status: DC
  Administered 2014-01-09: 15 mL/h via INTRAVENOUS

## 2014-01-09 MED ORDER — IOHEXOL 300 MG/ML  SOLN
100.0000 mL | Freq: Once | INTRAMUSCULAR | Status: AC | PRN
  Administered 2014-01-09: 100 mL via INTRAVENOUS

## 2014-01-09 MED ORDER — ACETAMINOPHEN 650 MG RE SUPP
650.0000 mg | Freq: Once | RECTAL | Status: AC
  Administered 2014-01-09: 650 mg via RECTAL
  Filled 2014-01-09: qty 1

## 2014-01-09 NOTE — ED Notes (Addendum)
Running a fever since 11am. D/c'd from [facility] on 10/13 for uti. Is on antibiotics. Hx. Of nontraumatic subdural hemorrhage.  Vss: bp 123/69, hr, 98, ra 96%, cbg 136. Pt. From [facility]. Temp at 11am 101 F.  After tylenol - 1300 Temp 99 F; at 1730: 100 F

## 2014-01-09 NOTE — ED Notes (Signed)
Unsuccessful IV attempt x2. Resident to try IV guided by [place].

## 2014-01-09 NOTE — ED Provider Notes (Addendum)
CSN: [id number]     Arrival date & time 01/09/14  1827 History   First MD Initiated Contact with Patient 01/09/14 1831     Chief Complaint  Patient presents with  . Fever     (Consider location/radiation/quality/duration/timing/severity/associated sxs/prior Treatment) Patient is a 58 y.o. male presenting with fever. The history is provided by the patient and the EMS personnel. The history is limited by the condition of the patient.  Fever pt w hx parkinsons, quadriplegia, recent admit for uti, presents via ems from [facility] w c/o fever today.  Pt not verbally responsive to questions, very limited history - level 5 caveat. ems notes pt sounds congested. ?abd tenderness on exam.       Past Medical History  Diagnosis Date  . Quadriplegia   . Depression   . Anemia   . Parkinson's disease   . Seizures   . Adrenal insufficiency   . Dysphagia 06/19/2013  . Aphasia 06/19/2013   Past Surgical History  Procedure Laterality Date  . Peg tube placement    . Peripherally inserted central catheter insertion     Family History  Problem Relation Age of Onset  . Hypothyroidism Mother   . Hypertension Mother   . Diabetes Father    History  Substance Use Topics  . Smoking status: Never Smoker   . Smokeless tobacco: Never Used  . Alcohol Use: No    Review of Systems  Unable to perform ROS: Patient unresponsive  Constitutional: Positive for fever.  level 5 caveat.     Allergies  Aspirin; Nsaids; and Penicillins  Home Medications   Prior to Admission medications   Medication Sig Start Date End Date Taking? Authorizing Provider  acetaminophen (TYLENOL) 325 MG tablet Place 2 tablets (650 mg total) into feeding tube every 6 (six) hours as needed for mild pain, fever or headache. 01/06/14   Modena Jansky, MD  Amino Acids-Protein Hydrolys (FEEDING SUPPLEMENT, PRO-STAT SUGAR FREE 64,) LIQD Place 30 mLs into feeding tube 3 (three) times daily. 01/06/14   Modena Jansky, MD  baclofen  (LIORESAL) 10 MG tablet Give 10 mg by tube 3 (three) times daily.    Historical Provider, MD  bisacodyl (DULCOLAX) 10 MG suppository Place 10 mg rectally as needed for mild constipation or moderate constipation.    Historical Provider, MD  cefUROXime (CEFTIN) 500 MG tablet Place 1 tablet (500 mg total) into feeding tube 2 (two) times daily with a meal. Discontinue after 01/10/14 doses. 01/06/14   Modena Jansky, MD  cetirizine (ZYRTEC) 5 MG tablet 5 mg by PEG Tube route every morning.     Historical Provider, MD  ferrous sulfate 220 (44 FE) MG/5ML solution 330 mg by PEG Tube route every morning.     Historical Provider, MD  fludrocortisone (FLORINEF) 0.1 MG tablet 0.1 mg by PEG Tube route every evening. Adrenocortical Insufficiency    Historical Provider, MD  folic acid (FOLVITE) 1 MG tablet 1 mg by PEG Tube route every morning.     Historical Provider, MD  Nutritional Supplements (TWOCAL HN) LIQD Give 1 Can by tube continuous. Run@ 55cc/hr for 20 hrs. Turn off pump 1 hour before and keep off until 1 hour after phenytoin dose    Historical Provider, MD  olopatadine (PATANOL) 0.1 % ophthalmic solution Place 1 drop into both eyes every morning.     Historical Provider, MD  phenytoin (DILANTIN) 125 MG/5ML suspension Place 125 mg into feeding tube 2 (two) times daily.  Historical Provider, MD  polyvinyl alcohol (LIQUIFILM TEARS) 1.4 % ophthalmic solution Place 1 drop into both eyes daily.     Historical Provider, MD  saccharomyces boulardii (FLORASTOR) 250 MG capsule Give 250 mg by tube 2 (two) times daily.    Historical Provider, MD  senna (SENOKOT) 8.6 MG TABS tablet Place 2 tablets (17.2 mg total) into feeding tube daily. 01/06/14   Modena Jansky, MD  Sodium Phosphates (RA SALINE ENEMA RE) Place 1 enema rectally once. If no relief from bisacodyl    Historical Provider, MD  traMADol (ULTRAM) 50 MG tablet Place 1 tablet (50 mg total) into feeding tube every 6 (six) hours as needed for moderate  pain. 01/07/14   Blanchie Serve, MD  Water For Irrigation, Sterile (FREE WATER) SOLN Place 200 mLs into feeding tube every 8 (eight) hours. 01/06/14   Modena Jansky, MD   BP 120/66  Temp(Src) 102.8 F (39.3 C) (Rectal)  Resp 19  SpO2 96% Physical Exam  Nursing note and vitals reviewed. Constitutional: He appears well-developed and well-nourished. No distress.  HENT:  Head: Atraumatic.  Mouth/Throat: Oropharynx is clear and moist.  Eyes: Conjunctivae are normal. Pupils are equal, round, and reactive to light. No scleral icterus.  Neck: Neck supple. No tracheal deviation present.  No stiffness or rigidity  Cardiovascular: Normal rate, regular rhythm, normal heart sounds and intact distal pulses.   Pulmonary/Chest: Effort normal. No accessory muscle usage. No respiratory distress.  Upper resp congestion.    Abdominal: Soft. Bowel sounds are normal. He exhibits no distension and no mass. There is tenderness. There is no rebound and no guarding.  Moderate mid to lower abd tenderness. No rebound/guarding.   Genitourinary:  No cva tenderness. Normal ext gen. No perirectal/rectal abscess noted. Scant liquid greenish stool, heme neg.   Musculoskeletal: Normal range of motion. He exhibits no edema and no tenderness.  Lymphadenopathy:    He has no cervical adenopathy.  Neurological: He is alert.  Skin: Skin is warm and dry. No rash noted. He is not diaphoretic.    ED Course  Procedures (including critical care time) Labs Review  Results for orders placed during the hospital encounter of 01/09/14  CBC      Result Value Ref Range   WBC 8.3  4.0 - 10.5 K/uL   RBC 4.31  4.22 - 5.81 MIL/uL   Hemoglobin 10.3 (*) 13.0 - 17.0 g/dL   HCT 31.2 (*) 39.0 - 52.0 %   MCV 72.4 (*) 78.0 - 100.0 fL   MCH 23.9 (*) 26.0 - 34.0 pg   MCHC 33.0  30.0 - 36.0 g/dL   RDW 13.3  11.5 - 15.5 %   Platelets 344  150 - 400 K/uL  COMPREHENSIVE METABOLIC PANEL      Result Value Ref Range   Sodium 132 (*)  137 - 147 mEq/L   Potassium 3.7  3.7 - 5.3 mEq/L   Chloride 94 (*) 96 - 112 mEq/L   CO2 24  19 - 32 mEq/L   Glucose, Bld 93  70 - 99 mg/dL   BUN 14  6 - 23 mg/dL   Creatinine, Ser 0.56  0.50 - 1.35 mg/dL   Calcium 9.0  8.4 - 10.5 mg/dL   Total Protein 8.3  6.0 - 8.3 g/dL   Albumin 3.0 (*) 3.5 - 5.2 g/dL   AST 23  0 - 37 U/L   ALT 10  0 - 53 U/L   Alkaline Phosphatase 152 (*) 39 -  117 U/L   Total Bilirubin 0.3  0.3 - 1.2 mg/dL   GFR calc non Af Amer >90  >90 mL/min   GFR calc Af Amer >90  >90 mL/min   Anion gap 14  5 - 15  URINALYSIS, ROUTINE W REFLEX MICROSCOPIC      Result Value Ref Range   Color, Urine AMBER (*) YELLOW   APPearance CLOUDY (*) CLEAR   Specific Gravity, Urine 1.025  1.005 - 1.030   pH 5.5  5.0 - 8.0   Glucose, UA NEGATIVE  NEGATIVE mg/dL   Hgb urine dipstick LARGE (*) NEGATIVE   Bilirubin Urine NEGATIVE  NEGATIVE   Ketones, ur NEGATIVE  NEGATIVE mg/dL   Protein, ur 30 (*) NEGATIVE mg/dL   Urobilinogen, UA 0.2  0.0 - 1.0 mg/dL   Nitrite NEGATIVE  NEGATIVE   Leukocytes, UA NEGATIVE  NEGATIVE  LACTIC ACID, PLASMA      Result Value Ref Range   Lactic Acid, Venous 1.3  0.5 - 2.2 mmol/L  URINE MICROSCOPIC-ADD ON      Result Value Ref Range   Squamous Epithelial / LPF RARE  RARE   WBC, UA 3-6  <3 WBC/hpf   RBC / HPF 11-20  <3 RBC/hpf   Bacteria, UA RARE  RARE   Casts GRANULAR CAST (*) NEGATIVE   Dg Chest 2 View  01/09/2014   CLINICAL DATA:  Fever. Recent diagnosis of urinary tract infection. Pain.  EXAM: CHEST  2 VIEW  COMPARISON:  01/05/2014  FINDINGS: No cardiomegaly. Unchanged aortic contours, distorted by rightward rotation.  There is chronic diffuse interstitial coarsening without pneumonia or overt edema. No effusion or pneumothorax. Stable biapical extrapleural thickening. 23 mm long triangular density overlapping the left mid chest is not definitively seen laterally and is presumably external to the patient.  IMPRESSION: Diffuse interstitial coarsening  which could be congestive or bronchitic.   Electronically Signed   By: Jorje Guild M.D.   On: 01/09/2014 21:15       MDM  Iv ns. Labs. Cultures.  Reviewed nursing notes and prior charts for additional history.   Pt with recent dx sdh, pt/fam opting for conservative management.   Labs.  ua  w few wbc, will cx.  cxr without definite infiltrate. Mild congestion congestion on exam.    No neck stiffness or rigidity.   Persistent mid to lower abd tenderness, and equivocal other source infection (ua w few wbc, chest congestion without definite infiltrate/pna on cxr). Will get ct abd/pelvis.   Family arrives, they indicate pt w ?increased head pain earlier, they note recent sdh, and request repeat imaging.  Given fever, chest congestion on exam, will rx for possible hcap.  vanc and cefepime iv.  Radiology calls at 0220 indicating ct head now w 1.5 cm midline shift as compared w 1 cm prior - will discuss/consult w ns.  Discussed w Dr Sherley Bounds who reviewed cts, states no new/emergent tx required tonight, and that he will have Dr Sherwood Gambler see patient this morning.   Radiology also states colitis/proctitis on ct abd. Will add flagyl iv.   Med service called for admission.  Dr Hal Hope states temp orders/stepdown       Mirna Mires, MD 01/10/14 [phone]

## 2014-01-10 ENCOUNTER — Encounter (HOSPITAL_COMMUNITY): Payer: Self-pay | Admitting: Internal Medicine

## 2014-01-10 ENCOUNTER — Inpatient Hospital Stay (HOSPITAL_COMMUNITY): Payer: Medicare Other

## 2014-01-10 DIAGNOSIS — I6200 Nontraumatic subdural hemorrhage, unspecified: Secondary | ICD-10-CM

## 2014-01-10 DIAGNOSIS — G9340 Encephalopathy, unspecified: Secondary | ICD-10-CM | POA: Diagnosis not present

## 2014-01-10 DIAGNOSIS — Z833 Family history of diabetes mellitus: Secondary | ICD-10-CM | POA: Diagnosis not present

## 2014-01-10 DIAGNOSIS — R6520 Severe sepsis without septic shock: Secondary | ICD-10-CM | POA: Diagnosis not present

## 2014-01-10 DIAGNOSIS — N390 Urinary tract infection, site not specified: Secondary | ICD-10-CM | POA: Diagnosis not present

## 2014-01-10 DIAGNOSIS — J9602 Acute respiratory failure with hypercapnia: Secondary | ICD-10-CM | POA: Diagnosis not present

## 2014-01-10 DIAGNOSIS — Y95 Nosocomial condition: Secondary | ICD-10-CM | POA: Diagnosis present

## 2014-01-10 DIAGNOSIS — I9589 Other hypotension: Secondary | ICD-10-CM | POA: Diagnosis present

## 2014-01-10 DIAGNOSIS — R10819 Abdominal tenderness, unspecified site: Secondary | ICD-10-CM | POA: Diagnosis not present

## 2014-01-10 DIAGNOSIS — G8250 Quadriplegia, unspecified: Secondary | ICD-10-CM | POA: Diagnosis present

## 2014-01-10 DIAGNOSIS — A047 Enterocolitis due to Clostridium difficile: Secondary | ICD-10-CM | POA: Diagnosis not present

## 2014-01-10 DIAGNOSIS — Z931 Gastrostomy status: Secondary | ICD-10-CM | POA: Diagnosis not present

## 2014-01-10 DIAGNOSIS — D649 Anemia, unspecified: Secondary | ICD-10-CM | POA: Diagnosis present

## 2014-01-10 DIAGNOSIS — Z880 Allergy status to penicillin: Secondary | ICD-10-CM | POA: Diagnosis not present

## 2014-01-10 DIAGNOSIS — A414 Sepsis due to anaerobes: Secondary | ICD-10-CM | POA: Diagnosis present

## 2014-01-10 DIAGNOSIS — A419 Sepsis, unspecified organism: Secondary | ICD-10-CM | POA: Diagnosis not present

## 2014-01-10 DIAGNOSIS — E871 Hypo-osmolality and hyponatremia: Secondary | ICD-10-CM | POA: Diagnosis not present

## 2014-01-10 DIAGNOSIS — R4701 Aphasia: Secondary | ICD-10-CM | POA: Diagnosis present

## 2014-01-10 DIAGNOSIS — G20 Parkinson's disease: Secondary | ICD-10-CM | POA: Diagnosis present

## 2014-01-10 DIAGNOSIS — R509 Fever, unspecified: Secondary | ICD-10-CM | POA: Diagnosis not present

## 2014-01-10 DIAGNOSIS — F329 Major depressive disorder, single episode, unspecified: Secondary | ICD-10-CM | POA: Diagnosis present

## 2014-01-10 DIAGNOSIS — R1310 Dysphagia, unspecified: Secondary | ICD-10-CM | POA: Diagnosis present

## 2014-01-10 DIAGNOSIS — E2740 Unspecified adrenocortical insufficiency: Secondary | ICD-10-CM | POA: Diagnosis not present

## 2014-01-10 DIAGNOSIS — R109 Unspecified abdominal pain: Secondary | ICD-10-CM | POA: Diagnosis not present

## 2014-01-10 DIAGNOSIS — E876 Hypokalemia: Secondary | ICD-10-CM | POA: Diagnosis not present

## 2014-01-10 DIAGNOSIS — Z886 Allergy status to analgesic agent status: Secondary | ICD-10-CM | POA: Diagnosis not present

## 2014-01-10 DIAGNOSIS — G40909 Epilepsy, unspecified, not intractable, without status epilepticus: Secondary | ICD-10-CM | POA: Diagnosis present

## 2014-01-10 DIAGNOSIS — R079 Chest pain, unspecified: Secondary | ICD-10-CM | POA: Diagnosis not present

## 2014-01-10 DIAGNOSIS — J9600 Acute respiratory failure, unspecified whether with hypoxia or hypercapnia: Secondary | ICD-10-CM | POA: Diagnosis not present

## 2014-01-10 DIAGNOSIS — I6203 Nontraumatic chronic subdural hemorrhage: Secondary | ICD-10-CM | POA: Diagnosis present

## 2014-01-10 DIAGNOSIS — Z8249 Family history of ischemic heart disease and other diseases of the circulatory system: Secondary | ICD-10-CM | POA: Diagnosis not present

## 2014-01-10 DIAGNOSIS — R918 Other nonspecific abnormal finding of lung field: Secondary | ICD-10-CM | POA: Diagnosis not present

## 2014-01-10 LAB — CBC WITH DIFFERENTIAL/PLATELET
BASOS PCT: 0 % (ref 0–1)
Basophils Absolute: 0 10*3/uL (ref 0.0–0.1)
EOS ABS: 0.3 10*3/uL (ref 0.0–0.7)
Eosinophils Relative: 3 % (ref 0–5)
HEMATOCRIT: 27.8 % — AB (ref 39.0–52.0)
Hemoglobin: 9.2 g/dL — ABNORMAL LOW (ref 13.0–17.0)
Lymphocytes Relative: 19 % (ref 12–46)
Lymphs Abs: 2.2 10*3/uL (ref 0.7–4.0)
MCH: 23.9 pg — AB (ref 26.0–34.0)
MCHC: 33.1 g/dL (ref 30.0–36.0)
MCV: 72.2 fL — AB (ref 78.0–100.0)
MONO ABS: 2 10*3/uL — AB (ref 0.1–1.0)
Monocytes Relative: 17 % — ABNORMAL HIGH (ref 3–12)
NEUTROS ABS: 7 10*3/uL (ref 1.7–7.7)
Neutrophils Relative %: 61 % (ref 43–77)
Platelets: 401 10*3/uL — ABNORMAL HIGH (ref 150–400)
RBC: 3.85 MIL/uL — ABNORMAL LOW (ref 4.22–5.81)
RDW: 13.3 % (ref 11.5–15.5)
WBC: 11.5 10*3/uL — ABNORMAL HIGH (ref 4.0–10.5)

## 2014-01-10 LAB — COMPREHENSIVE METABOLIC PANEL
ALBUMIN: 3.2 g/dL — AB (ref 3.5–5.2)
ALT: 10 U/L (ref 0–53)
AST: 22 U/L (ref 0–37)
Alkaline Phosphatase: 151 U/L — ABNORMAL HIGH (ref 39–117)
Anion gap: 14 (ref 5–15)
BUN: 12 mg/dL (ref 6–23)
CALCIUM: 9.2 mg/dL (ref 8.4–10.5)
CO2: 24 mEq/L (ref 19–32)
Chloride: 93 mEq/L — ABNORMAL LOW (ref 96–112)
Creatinine, Ser: 0.49 mg/dL — ABNORMAL LOW (ref 0.50–1.35)
GFR calc Af Amer: >90 mL/min (ref >90)
GFR calc non Af Amer: >90 mL/min (ref >90)
GLUCOSE: 94 mg/dL (ref 70–99)
POTASSIUM: 3.8 meq/L (ref 3.7–5.3)
SODIUM: 131 meq/L — AB (ref 137–147)
TOTAL PROTEIN: 8.8 g/dL — AB (ref 6.0–8.3)
Total Bilirubin: 0.5 mg/dL (ref 0.3–1.2)

## 2014-01-10 LAB — ABO/RH: ABO/RH(D): O NEG

## 2014-01-10 LAB — TYPE AND SCREEN
ABO/RH(D): O NEG
Antibody Screen: NEGATIVE

## 2014-01-10 LAB — CBG MONITORING, ED: GLUCOSE-CAPILLARY: 92 mg/dL (ref 70–99)

## 2014-01-10 LAB — PROCALCITONIN: Procalcitonin: <0.1 ng/mL

## 2014-01-10 LAB — GLUCOSE, CAPILLARY
Glucose-Capillary: 120 mg/dL — ABNORMAL HIGH (ref 70–99)
Glucose-Capillary: 136 mg/dL — ABNORMAL HIGH (ref 70–99)
Glucose-Capillary: 122 mg/dL — ABNORMAL HIGH (ref 70–99)
Glucose-Capillary: 129 mg/dL — ABNORMAL HIGH (ref 70–99)

## 2014-01-10 LAB — I-STAT ARTERIAL BLOOD GAS, ED
Acid-Base Excess: 1 mmol/L (ref 0.0–2.0)
Bicarbonate: 24.3 mEq/L — ABNORMAL HIGH (ref 20.0–24.0)
O2 Saturation: 94 %
PCO2 ART: 33.1 mmHg — AB (ref 35.0–45.0)
Patient temperature: 98.6
TCO2: 25 mmol/L (ref 0–100)
pH, Arterial: 7.473 — ABNORMAL HIGH (ref 7.350–7.450)
pO2, Arterial: 67 mmHg — ABNORMAL LOW (ref 80.0–100.0)

## 2014-01-10 LAB — PHENYTOIN LEVEL, TOTAL: PHENYTOIN LVL: 14 ug/mL (ref 10.0–20.0)

## 2014-01-10 LAB — CLOSTRIDIUM DIFFICILE BY PCR: Toxigenic C. Difficile by PCR: POSITIVE — AB

## 2014-01-10 LAB — INFLUENZA PANEL BY PCR (TYPE A & B)
H1N1 flu by pcr: NOT DETECTED
Influenza A By PCR: NEGATIVE
Influenza B By PCR: NEGATIVE

## 2014-01-10 LAB — MRSA PCR SCREENING: MRSA BY PCR: POSITIVE — AB

## 2014-01-10 MED ORDER — PHENYTOIN 125 MG/5ML PO SUSP
125.0000 mg | Freq: Two times a day (BID) | ORAL | Status: DC
  Administered 2014-01-10 – 2014-01-17 (×15): 125 mg
  Filled 2014-01-10 (×17): qty 5

## 2014-01-10 MED ORDER — LORATADINE 10 MG PO TABS
10.0000 mg | ORAL_TABLET | Freq: Every day | ORAL | Status: DC
  Administered 2014-01-10 – 2014-01-11 (×2): 10 mg via ORAL
  Filled 2014-01-10 (×2): qty 1

## 2014-01-10 MED ORDER — METRONIDAZOLE IN NACL 5-0.79 MG/ML-% IV SOLN
500.0000 mg | Freq: Three times a day (TID) | INTRAVENOUS | Status: DC
  Administered 2014-01-10 – 2014-01-11 (×4): 500 mg via INTRAVENOUS
  Filled 2014-01-10 (×6): qty 100

## 2014-01-10 MED ORDER — ONDANSETRON HCL 4 MG/2ML IJ SOLN
4.0000 mg | Freq: Four times a day (QID) | INTRAMUSCULAR | Status: DC | PRN
  Administered 2014-01-10 – 2014-01-11 (×2): 4 mg via INTRAVENOUS
  Filled 2014-01-10 (×2): qty 2

## 2014-01-10 MED ORDER — ONDANSETRON HCL 4 MG PO TABS: 4.0000 mg | ORAL_TABLET | Freq: Four times a day (QID) | ORAL | Status: DC | PRN

## 2014-01-10 MED ORDER — HEPARIN SODIUM (PORCINE) 1000 UNIT/ML IJ SOLN
1000.0000 [IU] | INTRAMUSCULAR | Status: DC | PRN
  Filled 2014-01-10 (×2): qty 1

## 2014-01-10 MED ORDER — VANCOMYCIN 50 MG/ML ORAL SOLUTION
500.0000 mg | Freq: Four times a day (QID) | ORAL | Status: DC
  Administered 2014-01-10 – 2014-01-11 (×4): 500 mg via ORAL
  Filled 2014-01-10 (×8): qty 10

## 2014-01-10 MED ORDER — VANCOMYCIN HCL IN DEXTROSE 750-5 MG/150ML-% IV SOLN
750.0000 mg | Freq: Three times a day (TID) | INTRAVENOUS | Status: DC
  Administered 2014-01-10 – 2014-01-11 (×4): 750 mg via INTRAVENOUS
  Filled 2014-01-10 (×6): qty 150

## 2014-01-10 MED ORDER — DEXTROSE 5 % IV SOLN
1.0000 g | Freq: Three times a day (TID) | INTRAVENOUS | Status: DC
  Administered 2014-01-10 – 2014-01-11 (×4): 1 g via INTRAVENOUS
  Filled 2014-01-10 (×6): qty 1

## 2014-01-10 MED ORDER — SODIUM CHLORIDE 0.9 % IV SOLN
INTRAVENOUS | Status: AC
  Administered 2014-01-10: 05:00:00 via INTRAVENOUS

## 2014-01-10 MED ORDER — TRAMADOL HCL 50 MG PO TABS: 50.0000 mg | ORAL_TABLET | Freq: Four times a day (QID) | ORAL | Status: DC | PRN

## 2014-01-10 MED ORDER — BACLOFEN 10 MG PO TABS
10.0000 mg | ORAL_TABLET | Freq: Three times a day (TID) | ORAL | Status: DC
  Administered 2014-01-10 – 2014-01-17 (×22): 10 mg
  Filled 2014-01-10 (×24): qty 1

## 2014-01-10 MED ORDER — BISACODYL 10 MG RE SUPP: 10.0000 mg | RECTAL | Status: DC | PRN

## 2014-01-10 MED ORDER — FREE WATER: 200.0000 mL | Freq: Three times a day (TID) | Status: DC

## 2014-01-10 MED ORDER — HYDROCORTISONE NA SUCCINATE PF 100 MG IJ SOLR
50.0000 mg | Freq: Three times a day (TID) | INTRAMUSCULAR | Status: DC
  Administered 2014-01-10 – 2014-01-11 (×4): 50 mg via INTRAVENOUS
  Filled 2014-01-10 (×6): qty 1
  Filled 2014-01-10: qty 2

## 2014-01-10 MED ORDER — ACETAMINOPHEN 650 MG RE SUPP: 650.0000 mg | Freq: Four times a day (QID) | RECTAL | Status: DC | PRN

## 2014-01-10 MED ORDER — PRO-STAT SUGAR FREE PO LIQD
30.0000 mL | Freq: Three times a day (TID) | ORAL | Status: DC
  Administered 2014-01-10 – 2014-01-17 (×22): 30 mL
  Filled 2014-01-10 (×25): qty 30

## 2014-01-10 MED ORDER — ACETAMINOPHEN 325 MG PO TABS: 650.0000 mg | ORAL_TABLET | Freq: Four times a day (QID) | ORAL | Status: DC | PRN

## 2014-01-10 MED ORDER — SENNA 8.6 MG PO TABS
2.0000 | ORAL_TABLET | Freq: Every day | ORAL | Status: DC
  Administered 2014-01-13 – 2014-01-17 (×5): 17.2 mg
  Filled 2014-01-10 (×9): qty 2

## 2014-01-10 MED ORDER — FREE WATER: 100.0000 mL | Freq: Three times a day (TID) | Status: DC

## 2014-01-10 MED ORDER — FERROUS SULFATE 300 (60 FE) MG/5ML PO SYRP
300.0000 mg | ORAL_SOLUTION | Freq: Every morning | ORAL | Status: DC
  Administered 2014-01-10 – 2014-01-17 (×8): 300 mg
  Filled 2014-01-10 (×8): qty 5

## 2014-01-10 MED ORDER — FOLIC ACID 1 MG PO TABS
1.0000 mg | ORAL_TABLET | Freq: Every morning | ORAL | Status: DC
  Administered 2014-01-10 – 2014-01-17 (×8): 1 mg
  Filled 2014-01-10 (×8): qty 1

## 2014-01-10 MED ORDER — OLOPATADINE HCL 0.1 % OP SOLN
1.0000 [drp] | Freq: Every morning | OPHTHALMIC | Status: DC
  Administered 2014-01-10 – 2014-01-17 (×8): 1 [drp] via OPHTHALMIC
  Filled 2014-01-10: qty 5

## 2014-01-10 MED ORDER — VANCOMYCIN HCL IN DEXTROSE 1-5 GM/200ML-% IV SOLN
1000.0000 mg | Freq: Once | INTRAVENOUS | Status: AC
  Administered 2014-01-10: 1000 mg via INTRAVENOUS
  Filled 2014-01-10: qty 200

## 2014-01-10 MED ORDER — CHLORHEXIDINE GLUCONATE CLOTH 2 % EX PADS
6.0000 | MEDICATED_PAD | Freq: Every day | CUTANEOUS | Status: AC
  Administered 2014-01-10 – 2014-01-14 (×5): 6 via TOPICAL

## 2014-01-10 MED ORDER — SODIUM CHLORIDE 0.9 % IV SOLN
INTRAVENOUS | Status: DC
  Administered 2014-01-10: 02:00:00 via INTRAVENOUS

## 2014-01-10 MED ORDER — METRONIDAZOLE IN NACL 5-0.79 MG/ML-% IV SOLN
500.0000 mg | Freq: Once | INTRAVENOUS | Status: AC
  Administered 2014-01-10: 500 mg via INTRAVENOUS
  Filled 2014-01-10: qty 100

## 2014-01-10 MED ORDER — SACCHAROMYCES BOULARDII 250 MG PO CAPS
250.0000 mg | ORAL_CAPSULE | Freq: Two times a day (BID) | ORAL | Status: DC
  Administered 2014-01-10 – 2014-01-17 (×15): 250 mg via ORAL
  Filled 2014-01-10 (×16): qty 1

## 2014-01-10 MED ORDER — OSMOLITE 1.5 CAL PO LIQD
1000.0000 mL | ORAL | Status: DC
  Administered 2014-01-11 – 2014-01-16 (×3): 1000 mL
  Filled 2014-01-10 (×12): qty 1000

## 2014-01-10 MED ORDER — FLUDROCORTISONE ACETATE 0.1 MG PO TABS
0.1000 mg | ORAL_TABLET | Freq: Every evening | ORAL | Status: DC
Start: 2014-01-10 — End: 2014-01-17
  Administered 2014-01-11 – 2014-01-16 (×6): 0.1 mg
  Filled 2014-01-10 (×8): qty 1

## 2014-01-10 MED ORDER — POLYVINYL ALCOHOL 1.4 % OP SOLN
1.0000 [drp] | Freq: Every day | OPHTHALMIC | Status: DC
  Administered 2014-01-10 – 2014-01-17 (×8): 1 [drp] via OPHTHALMIC
  Filled 2014-01-10: qty 15

## 2014-01-10 MED ORDER — MUPIROCIN 2 % EX OINT
1.0000 "application " | TOPICAL_OINTMENT | Freq: Two times a day (BID) | CUTANEOUS | Status: AC
  Administered 2014-01-10 – 2014-01-14 (×9): 1 via NASAL
  Filled 2014-01-10 (×2): qty 22

## 2014-01-10 NOTE — Consult Note (Signed)
Reason for Consult:  Subdural hematoma Referring Physician:  Dr. Gean Birchwood  [name] is an 58 y.o. male.  HPI: Patient readmitted earlier this morning after a hospitalization a week ago from October 8 to October 12 on the triad hospitalist service and critical care medicine service. At that time he was admitted because of decreased responsiveness, and was found to have a UTI which was treated with antibiotics. Workup also revealed a left hemispheric chronic subdural hematoma with mild mass effect, however the patient stabilized neurologically, and in consultation with the patient and his family (parents, brother, sister-in-law) it was elected not to pursue with surgical evacuation, but rather outpatient monitoring with serial CT scans.  History was notable for a spastic quadriparesis and aphasia. He has resided at a skilled nursing facility since a complicated hospitalization in 2002, with a gastrostomy tube for tube feedings. He has been on chronic Dilantin, 125 mg twice a day via the gastrostomy tube.  Patient's mother explains that he was once again less responsive yesterday, and was brought to the emergency room at about 1900 and underwent evaluation with CT scan of the abdomen and pelvis and CT scan of the brain. Temperature in the emergency room at presentation was 102.8. He had an episode in the emergency room where he became suddenly unresponsive, but his mother did not note any convulsive activity. Dilantin level was subsequently checked and was 14.0 earlier this morning. Patient was admitted to the triad hospitalist service and started on IV antibiotics, and admitted to the medical intensive care unit.  Past Medical History:  Past Medical History  Diagnosis Date  . Quadriplegia   . Depression   . Anemia   . Parkinson's disease   . Seizures   . Adrenal insufficiency   . Dysphagia 06/19/2013  . Aphasia 06/19/2013    Past Surgical History:  Past Surgical History   Procedure Laterality Date  . Peg tube placement    . Peripherally inserted central catheter insertion      Family History:  Family History  Problem Relation Age of Onset  . Hypothyroidism Mother   . Hypertension Mother   . Diabetes Father     Social History:  reports that he has never smoked. He has never used smokeless tobacco. He reports that he does not drink alcohol or use illicit drugs.  Allergies:  Allergies  Allergen Reactions  . Aspirin     unk reaction  . Nsaids     unk reaction  . Penicillins     unk reaction. Pt has tolerated cephalosporins in the past.    Medications: I have reviewed the patient's current medications.  ROS:  Unobtainable due to the patient's aphasia  Physical Examination: Middle-aged male with spastic contractures of extremities, in no acute distress. Blood pressure 125/60, pulse 97, temperature 98.6 F (37 C), temperature source Oral, resp. rate 26, SpO2 98.00%.  Neurological Examination:  Mental Status Examination: Awake and alert, follows commands. No speech.  Cranial Nerve Examination: Pupils 2 mm bilaterally, equal, round, reactive to light.  EOMI. Face is symmetrical.  Motor Examination: Moves all 4 extremities to command, with spastic contractures of all 4 extremities.  Sensory Examination: Senses touch in all 4 extremities.  Reflex Examination: Limited due to spasticity.  Gait and Stance Examination: Not testable.    Results for orders placed during the hospital encounter of 01/09/14 (from the past 48 hour(s))  CBC     Status: Abnormal   Collection Time    01/09/14  7:54 PM      Result Value Ref Range   WBC 8.3  4.0 - 10.5 K/uL   RBC 4.31  4.22 - 5.81 MIL/uL   Hemoglobin 10.3 (*) 13.0 - 17.0 g/dL   HCT 40.0 (*) 18.0 - 97.0 %   MCV 72.4 (*) 78.0 - 100.0 fL   MCH 23.9 (*) 26.0 - 34.0 pg   MCHC 33.0  30.0 - 36.0 g/dL   RDW 44.9  25.2 - 41.5 %   Platelets 344  150 - 400 K/uL  COMPREHENSIVE METABOLIC PANEL     Status:  Abnormal   Collection Time    01/09/14  7:54 PM      Result Value Ref Range   Sodium 132 (*) 137 - 147 mEq/L   Potassium 3.7  3.7 - 5.3 mEq/L   Chloride 94 (*) 96 - 112 mEq/L   CO2 24  19 - 32 mEq/L   Glucose, Bld 93  70 - 99 mg/dL   BUN 14  6 - 23 mg/dL   Creatinine, Ser 9.01  0.50 - 1.35 mg/dL   Calcium 9.0  8.4 - 72.4 mg/dL   Total Protein 8.3  6.0 - 8.3 g/dL   Albumin 3.0 (*) 3.5 - 5.2 g/dL   AST 23  0 - 37 U/L   ALT 10  0 - 53 U/L   Alkaline Phosphatase 152 (*) 39 - 117 U/L   Total Bilirubin 0.3  0.3 - 1.2 mg/dL   GFR calc non Af Amer >90  >90 mL/min   GFR calc Af Amer >90  >90 mL/min   Comment: (NOTE)     The eGFR has been calculated using the CKD EPI equation.     This calculation has not been validated in all clinical situations.     eGFR's persistently <90 mL/min signify possible Chronic Kidney     Disease.   Anion gap 14  5 - 15  LACTIC ACID, PLASMA     Status: None   Collection Time    01/09/14  7:54 PM      Result Value Ref Range   Lactic Acid, Venous 1.3  0.5 - 2.2 mmol/L  URINALYSIS, ROUTINE W REFLEX MICROSCOPIC     Status: Abnormal   Collection Time    01/09/14  9:37 PM      Result Value Ref Range   Color, Urine AMBER (*) YELLOW   Comment: BIOCHEMICALS MAY BE AFFECTED BY COLOR   APPearance CLOUDY (*) CLEAR   Specific Gravity, Urine 1.025  1.005 - 1.030   pH 5.5  5.0 - 8.0   Glucose, UA NEGATIVE  NEGATIVE mg/dL   Hgb urine dipstick LARGE (*) NEGATIVE   Bilirubin Urine NEGATIVE  NEGATIVE   Ketones, ur NEGATIVE  NEGATIVE mg/dL   Protein, ur 30 (*) NEGATIVE mg/dL   Urobilinogen, UA 0.2  0.0 - 1.0 mg/dL   Nitrite NEGATIVE  NEGATIVE   Leukocytes, UA NEGATIVE  NEGATIVE  URINE MICROSCOPIC-ADD ON     Status: Abnormal   Collection Time    01/09/14  9:37 PM      Result Value Ref Range   Squamous Epithelial / LPF RARE  RARE   WBC, UA 3-6  <3 WBC/hpf   RBC / HPF 11-20  <3 RBC/hpf   Bacteria, UA RARE  RARE   Casts GRANULAR CAST (*) NEGATIVE  CBG MONITORING,  ED     Status: None   Collection Time    01/10/14  4:35 AM  Result Value Ref Range   Glucose-Capillary 92  70 - 99 mg/dL  I-STAT ARTERIAL BLOOD GAS, ED     Status: Abnormal   Collection Time    01/10/14  4:54 AM      Result Value Ref Range   pH, Arterial 7.473 (*) 7.350 - 7.450   pCO2 arterial 33.1 (*) 35.0 - 45.0 mmHg   pO2, Arterial 67.0 (*) 80.0 - 100.0 mmHg   Bicarbonate 24.3 (*) 20.0 - 24.0 mEq/L   TCO2 25  0 - 100 mmol/L   O2 Saturation 94.0     Acid-Base Excess 1.0  0.0 - 2.0 mmol/L   Patient temperature 98.6 F     Collection site RADIAL, ALLEN'S TEST ACCEPTABLE     Drawn by RT     Sample type ARTERIAL    COMPREHENSIVE METABOLIC PANEL     Status: Abnormal   Collection Time    01/10/14  5:42 AM      Result Value Ref Range   Sodium 131 (*) 137 - 147 mEq/L   Potassium 3.8  3.7 - 5.3 mEq/L   Chloride 93 (*) 96 - 112 mEq/L   CO2 24  19 - 32 mEq/L   Glucose, Bld 94  70 - 99 mg/dL   BUN 12  6 - 23 mg/dL   Creatinine, Ser 0.49 (*) 0.50 - 1.35 mg/dL   Calcium 9.2  8.4 - 10.5 mg/dL   Total Protein 8.8 (*) 6.0 - 8.3 g/dL   Albumin 3.2 (*) 3.5 - 5.2 g/dL   AST 22  0 - 37 U/L   ALT 10  0 - 53 U/L   Alkaline Phosphatase 151 (*) 39 - 117 U/L   Total Bilirubin 0.5  0.3 - 1.2 mg/dL   GFR calc non Af Amer >90  >90 mL/min   GFR calc Af Amer >90  >90 mL/min   Comment: (NOTE)     The eGFR has been calculated using the CKD EPI equation.     This calculation has not been validated in all clinical situations.     eGFR's persistently <90 mL/min signify possible Chronic Kidney     Disease.   Anion gap 14  5 - 15  CBC WITH DIFFERENTIAL     Status: Abnormal   Collection Time    01/10/14  5:42 AM      Result Value Ref Range   WBC 11.5 (*) 4.0 - 10.5 K/uL   RBC 3.85 (*) 4.22 - 5.81 MIL/uL   Hemoglobin 9.2 (*) 13.0 - 17.0 g/dL   HCT 27.8 (*) 39.0 - 52.0 %   MCV 72.2 (*) 78.0 - 100.0 fL   MCH 23.9 (*) 26.0 - 34.0 pg   MCHC 33.1  30.0 - 36.0 g/dL   RDW 13.3  11.5 - 15.5 %    Platelets 401 (*) 150 - 400 K/uL   Neutrophils Relative % 61  43 - 77 %   Lymphocytes Relative 19  12 - 46 %   Monocytes Relative 17 (*) 3 - 12 %   Eosinophils Relative 3  0 - 5 %   Basophils Relative 0  0 - 1 %   Neutro Abs 7.0  1.7 - 7.7 K/uL   Lymphs Abs 2.2  0.7 - 4.0 K/uL   Monocytes Absolute 2.0 (*) 0.1 - 1.0 K/uL   Eosinophils Absolute 0.3  0.0 - 0.7 K/uL   Basophils Absolute 0.0  0.0 - 0.1 K/uL   WBC Morphology ATYPICAL LYMPHOCYTES  PHENYTOIN LEVEL, TOTAL     Status: None   Collection Time    01/10/14  5:42 AM      Result Value Ref Range   Phenytoin Lvl 14.0  10.0 - 20.0 ug/mL    Dg Chest 2 View  01/09/2014   CLINICAL DATA:  Fever. Recent diagnosis of urinary tract infection. Pain.  EXAM: CHEST  2 VIEW  COMPARISON:  01/05/2014  FINDINGS: No cardiomegaly. Unchanged aortic contours, distorted by rightward rotation.  There is chronic diffuse interstitial coarsening without pneumonia or overt edema. No effusion or pneumothorax. Stable biapical extrapleural thickening. 23 mm long triangular density overlapping the left mid chest is not definitively seen laterally and is presumably external to the patient.  IMPRESSION: Diffuse interstitial coarsening which could be congestive or bronchitic.   Electronically Signed   By: Jorje Guild M.D.   On: 01/09/2014 21:15   Ct Head Wo Contrast  01/10/2014   CLINICAL DATA:  History of nontraumatic subdural hematoma. Increasing pain. Initial encounter.  EXAM: CT HEAD WITHOUT CONTRAST  TECHNIQUE: Contiguous axial images were obtained from the base of the skull through the vertex without intravenous contrast.  COMPARISON:  01/06/2014  FINDINGS: Skull and Sinuses:Negative for fracture or destructive process. The mastoids, middle ears, and imaged paranasal sinuses are clear.  Orbits: No acute abnormality.  Brain: There is a mixed density subdural collection around the left cerebral convexity which is increased from before. The collection is  predominantly isodense to hypodense, compatible with proteinaceous fluid. There are also thin high-density areas that could represent recent hemorrhage or septations. The scalloped appearance of the collection superiorly is also consistent with loculation. Rightward midline shift is increased, currently 15 mm previously 10 mm. There is marked subfalcine herniation and early left uncal herniation which was also present previously. No evidence of ACA infarct or ventricular entrapment.  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 The brain is globally atrophic. No evidence of acute infarct or hydrocephalus.  Critical Value/emergent results were called by telephone at the time of interpretation on 01/10/2014 at 12:17 am to Dr. Ashok Cordia, who verbally acknowledged these results.  IMPRESSION: 1. Increased mixed density subdural hematoma around the left cerebral convexity. Midline shift has increased to 15 mm, with associated herniations described above. 2. Atrophy and chronic putaminal necrosis, as above.   Electronically Signed   By: Jorje Guild M.D.   On: 01/10/2014 00:24   Ct Abdomen Pelvis W Contrast  01/10/2014   CLINICAL DATA:  Abdominal tenderness, pain and fever.  EXAM: CT ABDOMEN AND PELVIS WITH CONTRAST  TECHNIQUE: Multidetector CT imaging of the abdomen and pelvis was performed using the standard protocol following bolus administration of intravenous contrast.  CONTRAST:  180mL OMNIPAQUE IOHEXOL 300 MG/ML  SOLN  COMPARISON:  CT abdomen pelvis - 06/15/2012; lumbar spine CT -01/02/2014  FINDINGS: Normal hepatic contour. No discrete hepatic lesions. Normal appearance of the gallbladder. No radiopaque gallstones. No intra extrahepatic biliary duct dilatation. No ascites.  There is symmetric enhancement and excretion of the bilateral kidneys. Note is made of an approximately 2.5 cm  hypo attenuating (-8 Hounsfield unit) partially exophytic left-sided renal cyst. No discrete right-sided renal lesions. No definite renal stones on this postcontrast examination. No urinary obstruction or perinephric stranding.  Normal appearance of the bilateral adrenal glands, pancreas and spleen.  Enteric contrast extends to the level of the proximal ascending colon. A gastrostomy tube balloon is appropriately  insufflated within the mid body of the stomach. There is diffuse wall thickening involving the distal sigmoid colon (image 72, series 21), extending to the level of the rectum. The sigmoid colon is noted to be redundant but otherwise normal in appearance. Normal appearance of the appendix. No pneumoperitoneum, pneumatosis or portal venous gas.  Scattered mixed calcified and noncalcified atherosclerotic plaque with a normal caliber abdominal aorta. The major branch vessels of the abdominal aorta appear widely patent on this non CTA examination though exuberant calcification is noted involving the origin of the solitary right renal artery.  No retroperitoneal, mesenteric pelvic or inguinal lymphadenopathy. No retroperitoneal, mesenteric  Limited visualization of the lower thorax demonstrates minimal dependent ground-glass atelectasis. No discrete focal airspace opacities. No pleural effusion.  Normal heart size. No pericardial effusion. Coronary artery calcifications.  No acute or aggressive osseus abnormalities. Grossly unchanged moderate (approximately 30%) compression deformity involving the L5 vertebral body, and mild (under 25%) compression deformity involving the superior endplates of the X79 and T12 vertebral bodies.  IMPRESSION: 1. Diffuse wall thickening involving the distal sigmoid colon extending through the level of the rectum, the etiology of which is not depicted as examination with differential considerations including infectious, inflammatory and (less likely) ischemic etiologies. These  findings are associated with adjacent mesenteric stranding but without evidence of perforation or definable/drainable fluid collection. No enteric obstruction. 2. Appropriately positioned gastrostomy tube. 3. Grossly unchanged moderate compression deformity involving the L5 vertebral body and mild compression deformities involving the T11 and T12 vertebral bodies. Correlation with report from lumbar spine CT performed 01/02/2014 is recommended.   Electronically Signed   By: Sandi Mariscal M.D.   On: 01/10/2014 00:20     Assessment/Plan: Patient presenting with decreased responsiveness again, with fever, the source which is uncertain. CT of the abdomen and pelvis shows changes of uncertain significance. Patient had an episode of decreased responsiveness in the emergency room, but no convulsive activity. Patient's Dilantin level is therapeutic. CT of the head without contrast does show increased size of the chronic left hemispheric subdural hematoma with mild mass effect.  From a neurologic perspective, the patient is essentially back to his baseline once again. However the increased size of his chronic subdural hematoma may necessitate craniotomy for evacuation. I did speak with his mother at length again today regarding his overall condition, his neurologic condition, and his most recent CT scan results. We once again discussed risks of surgery including risks of infection, bleeding, neurologic deficit and dysfunction including paralysis,, and death, the risk of recurrence of the subdural hematoma and possibly for further surgery, and anesthetic risks of myocardial function, stroke, pneumonia, and death.  From a neurosurgical perspective I've advised continued monitoring in the intensive care unit for at least 48 hours, including neurochecks. Certainly his Dilantin needs to be continued, and if there is any further seizure activity, neurology consultation for anticonvulsant management is indicated.  The  neurosurgical service will continue to follow patient in consultation with the triad hospitalist service and critical care medicine (CCM) service. Hopefully his medical/infectious difficulties will be further clarified, and we will continue to monitor his neurologic function.  Hosie Spangle, MD 01/10/2014, 8:05 AM

## 2014-01-10 NOTE — Progress Notes (Signed)
Patient placed on c-diff precautions to rule c-diff. Family and patient educated about the precautions and the use of personal protective equipment and strict handwashing. Lesli Issa, Lauralyn Primes, RN

## 2014-01-10 NOTE — ED Notes (Signed)
RN, Tiffany in room and pushed duress button, This RN to room right away, pt unresponsive for approximately 1 minute. Pt did not respond to sternal rub. Pt HR 136.

## 2014-01-10 NOTE — Progress Notes (Signed)
[facility] Progress Note Patient Name: Jadon Harbaugh DOB: 1955-06-14 MRN: [id number]   Date of Service  01/10/2014  HPI/Events of Note  Patient with vomiting and nausea x 2  eICU Interventions  Hold TF until AM, monitor CBG, if CBGs 70mg /dL will consider starting IVFs     Intervention Category Intermediate Interventions: Other:  Rickelle Sylvestre 01/10/2014, 4:36 PM

## 2014-01-10 NOTE — Progress Notes (Signed)
Utilization review completed. Raijon Lindfors, RN, BSN. 

## 2014-01-10 NOTE — ED Provider Notes (Signed)
Nurse pulled alarm bell. Pt had unresponsive episode, she states his eyes deviated and he did not respond to sternal rub.  Lasted about 30 sec and face got red and seemed to stop breathing. He had trouble focusing afterward and seemed confused.  Pt didn't loose pulses per nursing staff.   Rolland Porter, MD, FACEP   Janice Norrie, MD 01/10/14 [phone]

## 2014-01-10 NOTE — Progress Notes (Signed)
INITIAL NUTRITION ASSESSMENT  DOCUMENTATION CODES Per approved criteria  -Not Applicable   INTERVENTION: Recommend initiation of Osmolite 1.5 @ 55 ml/hr via PEG.   30 ml Prostat TID.   At goal rate, tube feeding regimen will provide 1950 kcal, 114 grams of protein, and 922 ml of H2O.  Once IVF's are d/c'ed recommend:  260 ml H2O every 6 hours.   NUTRITION DIAGNOSIS: Inadequate oral intake related to inability to eat as evidenced by NPO status.   Goal: Pt to meet >/= 90% of their estimated nutrition needs   Monitor:  TF tolerance, weight trends, labs  Reason for Assessment: New TF  58 y.o. male  Admitting Dx: Fever  ASSESSMENT: 58 y.o. male with history of quadriplegia, seizures, adrenal insufficiency, peg tube, Parkinson's Disease who was recently admitted for subdural hematoma was brought to the ER because of fever.  - TF regimen is TwoCal HN at 55 ml/hr for 20 hours. This provides 2200 kcal, and 92 gram protein  - Spoke with RN who said that TF had not yet been started. TF order in chart: Osmolite 1.5 @ 55 ml/ hr with 30 ml Prostat TID to provide 1950 kcal, 114 g protein and 922 ml of water.   -Per chart history, pt's weight is stable.   Labs: CBGs: 92-102 Na low K WNL BUN WNL  Height: Ht Readings from Last 1 Encounters:  01/02/14 6' 2.02" (1.88 m)    Weight: Wt Readings from Last 1 Encounters:  01/02/14 155 lb 6.8 oz (70.5 kg)    Ideal Body Weight: 82.2 kg  % Ideal Body Weight: 86%  Wt Readings from Last 10 Encounters:  01/02/14 155 lb 6.8 oz (70.5 kg)  12/11/13 155 lb 6.4 oz (70.489 kg)  10/30/13 156 lb 3.2 oz (70.852 kg)  07/23/13 156 lb (70.761 kg)  06/24/13 159 lb 9.6 oz (72.394 kg)  05/27/13 158 lb 9.6 oz (71.94 kg)  06/15/12 150 lb 2.1 oz (68.1 kg)  06/13/12 150 lb (68.04 kg)  11/11/11 151 lb 3.8 oz (68.6 kg)    Usual Body Weight: 155 lb  % Usual Body Weight: 100%  BMI:  There is no weight on file to calculate BMI.  Estimated  Nutritional Needs: Kcal: 1900-2100 Protein: 105-115 g Fluid: 1.9-2.1 L/day  Skin: intact  Diet Order:    EDUCATION NEEDS: -Education needs addressed  No intake or output data in the 24 hours ending 01/10/14 1013  Last BM: 10/16   Labs:   Recent Labs Lab 01/09/14 1954 01/10/14 0542  NA 132* 131*  K 3.7 3.8  CL 94* 93*  CO2 24 24  BUN 14 12  CREATININE 0.56 0.49*  CALCIUM 9.0 9.2  GLUCOSE 93 94    CBG (last 3)   Recent Labs  01/10/14 0435 01/10/14 0809  GLUCAP 92 120*    Scheduled Meds: . baclofen  10 mg Per Tube TID  . ceFEPime (MAXIPIME) IV  1 g Intravenous 3 times per day  . feeding supplement (PRO-STAT SUGAR FREE 64)  30 mL Per Tube TID  . ferrous sulfate  300 mg Per Tube q morning - 10a  . fludrocortisone  0.1 mg Per Tube QPM  . folic acid  1 mg Per Tube q morning - 10a  . free water  200 mL Per Tube 3 times per day  . hydrocortisone sod succinate (SOLU-CORTEF) inj  50 mg Intravenous Q8H  . loratadine  10 mg Oral Daily  . metronidazole  500 mg Intravenous  Q8H  . olopatadine  1 drop Both Eyes q morning - 10a  . phenytoin  125 mg Per Tube BID  . polyvinyl alcohol  1 drop Both Eyes Daily  . saccharomyces boulardii  250 mg Oral BID  . senna  2 tablet Per Tube Daily  . vancomycin  750 mg Intravenous Q8H    Continuous Infusions: . sodium chloride 100 mL/hr at 01/10/14 0525  . feeding supplement (OSMOLITE 1.5 CAL)      Past Medical History  Diagnosis Date  . Quadriplegia   . Depression   . Anemia   . Parkinson's disease   . Seizures   . Adrenal insufficiency   . Dysphagia 06/19/2013  . Aphasia 06/19/2013    Past Surgical History  Procedure Laterality Date  . Peg tube placement    . Peripherally inserted central catheter insertion      Laurette Schimke RD, LDN

## 2014-01-10 NOTE — H&P (Addendum)
[facility] History and Physical  Traivon Juneau NWG:[id number] DOB: January 27, 1956 DOA: 01/09/2014  Referring physician: ER physician. PCP: [name], MD   History obtained from patient's mother.  Chief Complaint: Fever.  HPI: Cam Sadusky is a 58 y.o. male with history of quadriplegia, seizures, adrenal insufficiency who was recently admitted for subdural hematoma was brought to the ER because of fever. Patient's mother noticed that patient is becoming increasingly lethargic since morning and was found to have fever. Patient also had some congestion with cough. Did not have any nausea vomiting or diarrhea. In the ER chest x-ray shows congestion and since patient had some abdominal distention and mild tenderness as noted by ER physician patient had a CAT scan done which shows inflammatory changes in the sigmoid and rectal area. Patient on arrival was drowsy but became more alert after arrival as per the family. CT of the head done shows worsening of the known subdural hematoma with worsening midline shift and herniation. On-call neurosurgeon Dr. Yetta Barre was consulted by the ETT and they will be following as consult. During recent admission patient was found to have Proteus mirabilis UTI.  Review of Systems: As presented in the history of presenting illness, rest negative.  Past Medical History  Diagnosis Date  . Quadriplegia   . Depression   . Anemia   . Parkinson's disease   . Seizures   . Adrenal insufficiency   . Dysphagia 06/19/2013  . Aphasia 06/19/2013   Past Surgical History  Procedure Laterality Date  . Peg tube placement    . Peripherally inserted central catheter insertion     Social History:  reports that he has never smoked. He has never used smokeless tobacco. He reports that he does not drink alcohol or use illicit drugs. Where does patient live home. Can patient participate in ADLs? No.  Allergies  Allergen Reactions  . Aspirin     unk reaction  .  Nsaids     unk reaction  . Penicillins     unk reaction. Pt has tolerated cephalosporins in the past.    Family History:  Family History  Problem Relation Age of Onset  . Hypothyroidism Mother   . Hypertension Mother   . Diabetes Father       Prior to Admission medications   Medication Sig Start Date End Date Taking? Authorizing Provider  acetaminophen (TYLENOL) 325 MG tablet Place 2 tablets (650 mg total) into feeding tube every 6 (six) hours as needed for mild pain, fever or headache. 01/06/14  Yes Elease Etienne, MD  Amino Acids-Protein Hydrolys (FEEDING SUPPLEMENT, PRO-STAT SUGAR FREE 64,) LIQD Place 30 mLs into feeding tube 3 (three) times daily. 01/06/14  Yes Elease Etienne, MD  baclofen (LIORESAL) 10 MG tablet Give 10 mg by tube 3 (three) times daily.   Yes Historical Provider, MD  bisacodyl (DULCOLAX) 10 MG suppository Place 10 mg rectally as needed for mild constipation or moderate constipation.   Yes Historical Provider, MD  cefUROXime (CEFTIN) 500 MG tablet Place 1 tablet (500 mg total) into feeding tube 2 (two) times daily with a meal. Discontinue after 01/10/14 doses. 01/06/14  Yes Elease Etienne, MD  cetirizine (ZYRTEC) 5 MG tablet 5 mg by PEG Tube route every morning.    Yes Historical Provider, MD  ferrous sulfate 220 (44 FE) MG/5ML solution 330 mg by PEG Tube route every morning.    Yes Historical Provider, MD  fludrocortisone (FLORINEF) 0.1 MG tablet 0.1 mg by PEG Tube route every  evening. Adrenocortical Insufficiency   Yes Historical Provider, MD  folic acid (FOLVITE) 1 MG tablet 1 mg by PEG Tube route every morning.    Yes Historical Provider, MD  Nutritional Supplements (TWOCAL HN) LIQD Give 1 Can by tube continuous. Run@ 55cc/hr for 20 hrs. Turn off pump 1 hour before and keep off until 1 hour after phenytoin dose   Yes Historical Provider, MD  olopatadine (PATANOL) 0.1 % ophthalmic solution Place 1 drop into both eyes every morning.    Yes Historical Provider,  MD  phenytoin (DILANTIN) 125 MG/5ML suspension Place 125 mg into feeding tube 2 (two) times daily.    Yes Historical Provider, MD  polyvinyl alcohol (LIQUIFILM TEARS) 1.4 % ophthalmic solution Place 1 drop into both eyes daily.    Yes Historical Provider, MD  saccharomyces boulardii (FLORASTOR) 250 MG capsule Give 250 mg by tube 2 (two) times daily.   Yes Historical Provider, MD  senna (SENOKOT) 8.6 MG TABS tablet Place 2 tablets (17.2 mg total) into feeding tube daily. 01/06/14  Yes Elease Etienne, MD  Sodium Phosphates (RA SALINE ENEMA RE) Place 1 enema rectally once. If no relief from bisacodyl   Yes Historical Provider, MD  traMADol (ULTRAM) 50 MG tablet Place 1 tablet (50 mg total) into feeding tube every 6 (six) hours as needed for moderate pain. 01/07/14  Yes Mahima Glade Lloyd, MD  Water For Irrigation, Sterile (FREE WATER) SOLN Place 200 mLs into feeding tube every 8 (eight) hours. 01/06/14   Elease Etienne, MD    Physical Exam: Filed Vitals:   01/09/14 2030 01/09/14 2149 01/09/14 2200 01/09/14 2250  BP: 117/60 110/62 110/64 121/48  Pulse: 93 89 90 90  Temp:    100.3 F (37.9 C)  TempSrc:    Rectal  Resp: 23 21 18 21   SpO2: 94% 100% 100% 99%     General:  Moderately built and nourished.  Eyes: Anicteric no pallor.  ENT: No discharge from the ears eyes nose mouth.  Neck: No mass felt.  Cardiovascular: S1-S2 heard.  Respiratory: No rhonchi or crepitations.  Abdomen: PEG tube in place. Mildly distended abdomen nontender bowel sounds present.  Skin: No rash.  Musculoskeletal: No edema. Contractures of the extremities.  Psychiatric: Patient is nonverbal at this time.  Neurologic: Alert awake nonverbal. Quadriplegic.  Labs on Admission:  Basic Metabolic Panel:  Recent Labs Lab 01/03/14 0224 01/09/14 1954  NA 141 132*  K 3.8 3.7  CL 104 94*  CO2 25 24  GLUCOSE 114* 93  BUN 18 14  CREATININE 0.49* 0.56  CALCIUM 8.5 9.0  MG 2.0  --   PHOS 1.9*  --     Liver Function Tests:  Recent Labs Lab 01/09/14 1954  AST 23  ALT 10  ALKPHOS 152*  BILITOT 0.3  PROT 8.3  ALBUMIN 3.0*   No results found for this basename: LIPASE, AMYLASE,  in the last 168 hours No results found for this basename: AMMONIA,  in the last 168 hours CBC:  Recent Labs Lab 01/03/14 0224 01/04/14 0839 01/05/14 0330 01/09/14 1954  WBC 10.3 7.6 7.3 8.3  HGB 8.2* 8.3* 8.6* 10.3*  HCT 25.4* 25.7* 27.1* 31.2*  MCV 74.9* 73.4* 75.7* 72.4*  PLT 233 299 310 344   Cardiac Enzymes: No results found for this basename: CKTOTAL, CKMB, CKMBINDEX, TROPONINI,  in the last 168 hours  BNP (last 3 results) No results found for this basename: PROBNP,  in the last 8760 hours CBG:  Recent  Labs Lab 01/03/14 0336 01/03/14 0801 01/03/14 1120 01/03/14 1647 01/03/14 2103  GLUCAP 100* 113* 135* 109* 105*    Radiological Exams on Admission: Dg Chest 2 View  01/09/2014   CLINICAL DATA:  Fever. Recent diagnosis of urinary tract infection. Pain.  EXAM: CHEST  2 VIEW  COMPARISON:  01/05/2014  FINDINGS: No cardiomegaly. Unchanged aortic contours, distorted by rightward rotation.  There is chronic diffuse interstitial coarsening without pneumonia or overt edema. No effusion or pneumothorax. Stable biapical extrapleural thickening. 23 mm long triangular density overlapping the left mid chest is not definitively seen laterally and is presumably external to the patient.  IMPRESSION: Diffuse interstitial coarsening which could be congestive or bronchitic.   Electronically Signed   By: Tiburcio Pea M.D.   On: 01/09/2014 21:15   Ct Head Wo Contrast  01/10/2014   CLINICAL DATA:  History of nontraumatic subdural hematoma. Increasing pain. Initial encounter.  EXAM: CT HEAD WITHOUT CONTRAST  TECHNIQUE: Contiguous axial images were obtained from the base of the skull through the vertex without intravenous contrast.  COMPARISON:  01/06/2014  FINDINGS: Skull and Sinuses:Negative for fracture  or destructive process. The mastoids, middle ears, and imaged paranasal sinuses are clear.  Orbits: No acute abnormality.  Brain: There is a mixed density subdural collection around the left cerebral convexity which is increased from before. The collection is predominantly isodense to hypodense, compatible with proteinaceous fluid. There are also thin high-density areas that could represent recent hemorrhage or septations. The scalloped appearance of the collection superiorly is also consistent with loculation. Rightward midline shift is increased, currently 15 mm previously 10 mm. There is marked subfalcine herniation and early left uncal herniation which was also present previously. No evidence of ACA infarct or ventricular entrapment.  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 The brain is globally atrophic. No evidence of acute infarct or hydrocephalus.  Critical Value/emergent results were called by telephone at the time of interpretation on 01/10/2014 at 12:17 am to Dr. Denton Lank, who verbally acknowledged these results.  IMPRESSION: 1. Increased mixed density subdural hematoma around the left cerebral convexity. Midline shift has increased to 15 mm, with associated herniations described above. 2. Atrophy and chronic putaminal necrosis, as above.   Electronically Signed   By: Tiburcio Pea M.D.   On: 01/10/2014 00:24   Ct Abdomen Pelvis W Contrast  01/10/2014   CLINICAL DATA:  Abdominal tenderness, pain and fever.  EXAM: CT ABDOMEN AND PELVIS WITH CONTRAST  TECHNIQUE: Multidetector CT imaging of the abdomen and pelvis was performed using the standard protocol following bolus administration of intravenous contrast.  CONTRAST:  OMNIPAQUE IOHEXOL 300 MG/ML  SOLN  COMPARISON:  CT abdomen pelvis - 06/15/2012; lumbar spine CT -01/02/2014  FINDINGS: Normal hepatic contour. No  discrete hepatic lesions. Normal appearance of the gallbladder. No radiopaque gallstones. No intra extrahepatic biliary duct dilatation. No ascites.  There is symmetric enhancement and excretion of the bilateral kidneys. Note is made of an approximately 2.5 cm hypo attenuating (-8 Hounsfield unit) partially exophytic left-sided renal cyst. No discrete right-sided renal lesions. No definite renal stones on this postcontrast examination. No urinary obstruction or perinephric stranding.  Normal appearance of the bilateral adrenal glands, pancreas and spleen.  Enteric contrast extends to the level of the proximal ascending colon. A gastrostomy tube balloon is appropriately insufflated within the mid body of the stomach. There is diffuse wall thickening involving the distal  sigmoid colon (image 72, series 21), extending to the level of the rectum. The sigmoid colon is noted to be redundant but otherwise normal in appearance. Normal appearance of the appendix. No pneumoperitoneum, pneumatosis or portal venous gas.  Scattered mixed calcified and noncalcified atherosclerotic plaque with a normal caliber abdominal aorta. The major branch vessels of the abdominal aorta appear widely patent on this non CTA examination though exuberant calcification is noted involving the origin of the solitary right renal artery.  No retroperitoneal, mesenteric pelvic or inguinal lymphadenopathy. No retroperitoneal, mesenteric  Limited visualization of the lower thorax demonstrates minimal dependent ground-glass atelectasis. No discrete focal airspace opacities. No pleural effusion.  Normal heart size. No pericardial effusion. Coronary artery calcifications.  No acute or aggressive osseus abnormalities. Grossly unchanged moderate (approximately 30%) compression deformity involving the L5 vertebral body, and mild (under 25%) compression deformity involving the superior endplates of the T11 and T12 vertebral bodies.  IMPRESSION: 1. Diffuse wall  thickening involving the distal sigmoid colon extending through the level of the rectum, the etiology of which is not depicted as examination with differential considerations including infectious, inflammatory and (less likely) ischemic etiologies. These findings are associated with adjacent mesenteric stranding but without evidence of perforation or definable/drainable fluid collection. No enteric obstruction. 2. Appropriately positioned gastrostomy tube. 3. Grossly unchanged moderate compression deformity involving the L5 vertebral body and mild compression deformities involving the T11 and T12 vertebral bodies. Correlation with report from lumbar spine CT performed 01/02/2014 is recommended.   Electronically Signed   By: Simonne Come M.D.   On: 01/10/2014 00:20    Assessment/Plan Principal Problem:   Fever Active Problems:   Adrenal insufficiency   Quadriplegia   Seizures   Acute encephalopathy   Subdural hematoma   1. Fever - source could be either respiratory or abdominal. Blood cultures and urine cultures and ordered patient has been empirically placed on vancomycin cefepime and Flagyl. Check influenza PCR. Closely follow fever trends. 2. Worsening of the subdural hematoma - further recommendations per neurosurgery. 3. Acute encephalopathy - probably secondary to #1 and also #2. Check ABG to rule out any carbon dioxide retention. Closely follow mental status. 4. History of addrenal insufficiency - patient has been placed on stress dose steroids. 5. History of seizures - continue Dilantin. Check Dilantin levels. 6. Chronic anemia - follow CBC. 7. PEG tube feeds. 8. Quadriplegia.  Addendum - patient brief episode of unresponsiveness. After sternal rub and a minute later patient's mental status came back to baseline. CBG was within normal limits. ABG was ordered which did not show any carbon dioxide retention. I did discuss with on-call neurosurgeon Dr. Yetta Barre. Patient probably may have had  a seizure. And Dr. Yetta Barre has advised to continue with Dilantin. Neurosurgery will be seeing patient in consult. Check Dilantin levels.    Code Status: Full code.  Family Communication: Patient's mother at the bedside.  Disposition Plan: Admit to inpatient to step down.    Georgeanna Radziewicz N. [facility] Pager [phone].  If 7PM-7AM, please contact night-coverage [email or web address] Password [email or web address] 01/10/2014, 2:13 AM

## 2014-01-10 NOTE — Progress Notes (Signed)
ANTIBIOTIC CONSULT NOTE - INITIAL  Pharmacy Consult for Vancomycin/Cefepime  Indication: rule out pneumonia  Allergies  Allergen Reactions  . Aspirin     unk reaction  . Nsaids     unk reaction  . Penicillins     unk reaction. Pt has tolerated cephalosporins in the past.    Patient Measurements: ~70.5 kg  Vital Signs: Temp: 100.3 F (37.9 C) (10/15 2250) Temp Source: Rectal (10/15 2250) BP: 121/48 mmHg (10/15 2250) Pulse Rate: 90 (10/15 2250)  Labs:  Recent Labs  01/09/14 1954  WBC 8.3  HGB 10.3*  PLT 344  CREATININE 0.56    Medical History: Past Medical History  Diagnosis Date  . Quadriplegia   . Depression   . Anemia   . Parkinson's disease   . Seizures   . Adrenal insufficiency   . Dysphagia 06/19/2013  . Aphasia 06/19/2013   Assessment: 58 y/o quadriplegic M with recent DC from hospital, starting vancomycin/cefepime for possible HCAP, WBC wnl, Scr 0.56 (but harder to assess renal function given quadriplegia), other labs as above.   Goal of Therapy:  Vancomycin trough level 15-20 mcg/ml  Plan:  -Vancomycin 1000 mg IV x 1, then 750 mg IV q8h -Cefepime 1g IV q8h -Trend WBC, temp, Scr -Drug levels ASAP given quadriplegia  Narda Bonds 01/10/2014,12:27 AM

## 2014-01-10 NOTE — Progress Notes (Signed)
  [facility] TEAM 1 - Stepdown/ICU TEAM  Discussed care w/ Dr. Harland Dingwall w/ PCCM.  Noted NS recommendation that pt be monitored in the ICU for 48hrs.  According to protocol established by Dr. Titus Mould, PCCM will assume care until such time that pt can be transferred out of ICU.  Cherene Altes, MD [facility] For Consults/Admissions - Flow Manager - [phone] Office  [phone] Pager [phone]  On-Call/Text Page:      [email or web address]      password [facility]

## 2014-01-10 NOTE — Consult Note (Signed)
PULMONARY / CRITICAL CARE MEDICINE   Name: [name] MRN: [id number] DOB: 1955/10/03    ADMISSION DATE:  01/09/2014 CONSULTATION DATE: 10/16  REFERRING MD :  [facility]  CHIEF COMPLAINT:  Increased SDH  INITIAL PRESENTATION: Fever  STUDIES:  CT 10/16 increased left sdh  SIGNIFICANT EVENTS: 10/16 ICU admit for fever and increased SDH   HISTORY OF PRESENT ILLNESS:   58 yo quadriparetic, snf resident, who has been admitted x 2 over the last 8 days. 10/8 with decreased LOC(chronic subdural hematomas) and on 10/16 with fever 102.5. Ct of head revealed increase in Left SDH with some shift and mass affect. Due to his complex PMH neurosurgery wants him in ICU for at least 48 hours and PCCM will assume his care till he leaves ICU.   PAST MEDICAL HISTORY :   has a past medical history of Quadriplegia; Depression; Anemia; Parkinson's disease; Seizures; Adrenal insufficiency; Dysphagia (06/19/2013); and Aphasia (06/19/2013).  has past surgical history that includes PEG tube placement and Peripherally inserted central catheter insertion. Prior to Admission medications   Medication Sig Start Date End Date Taking? Authorizing Provider  acetaminophen (TYLENOL) 325 MG tablet Place 2 tablets (650 mg total) into feeding tube every 6 (six) hours as needed for mild pain, fever or headache. 01/06/14  Yes Elease Etienne, MD  Amino Acids-Protein Hydrolys (FEEDING SUPPLEMENT, PRO-STAT SUGAR FREE 64,) LIQD Place 30 mLs into feeding tube 3 (three) times daily. 01/06/14  Yes Elease Etienne, MD  baclofen (LIORESAL) 10 MG tablet Give 10 mg by tube 3 (three) times daily.   Yes Historical Provider, MD  bisacodyl (DULCOLAX) 10 MG suppository Place 10 mg rectally as needed for mild constipation or moderate constipation.   Yes Historical Provider, MD  cefUROXime (CEFTIN) 500 MG tablet Place 1 tablet (500 mg total) into feeding tube 2 (two) times daily with a meal. Discontinue after 01/10/14 doses. 01/06/14  Yes  Elease Etienne, MD  cetirizine (ZYRTEC) 5 MG tablet 5 mg by PEG Tube route every morning.    Yes Historical Provider, MD  ferrous sulfate 220 (44 FE) MG/5ML solution 330 mg by PEG Tube route every morning.    Yes Historical Provider, MD  fludrocortisone (FLORINEF) 0.1 MG tablet 0.1 mg by PEG Tube route every evening. Adrenocortical Insufficiency   Yes Historical Provider, MD  folic acid (FOLVITE) 1 MG tablet 1 mg by PEG Tube route every morning.    Yes Historical Provider, MD  Nutritional Supplements (TWOCAL HN) LIQD Give 1 Can by tube continuous. Run@ 55cc/hr for 20 hrs. Turn off pump 1 hour before and keep off until 1 hour after phenytoin dose   Yes Historical Provider, MD  olopatadine (PATANOL) 0.1 % ophthalmic solution Place 1 drop into both eyes every morning.    Yes Historical Provider, MD  phenytoin (DILANTIN) 125 MG/5ML suspension Place 125 mg into feeding tube 2 (two) times daily.    Yes Historical Provider, MD  polyvinyl alcohol (LIQUIFILM TEARS) 1.4 % ophthalmic solution Place 1 drop into both eyes daily.    Yes Historical Provider, MD  saccharomyces boulardii (FLORASTOR) 250 MG capsule Give 250 mg by tube 2 (two) times daily.   Yes Historical Provider, MD  senna (SENOKOT) 8.6 MG TABS tablet Place 2 tablets (17.2 mg total) into feeding tube daily. 01/06/14  Yes Elease Etienne, MD  Sodium Phosphates (RA SALINE ENEMA RE) Place 1 enema rectally once. If no relief from bisacodyl   Yes Historical Provider, MD  traMADol Janean Sark) 50  MG tablet Place 1 tablet (50 mg total) into feeding tube every 6 (six) hours as needed for moderate pain. 01/07/14  Yes Mahima Glade Lloyd, MD  Water For Irrigation, Sterile (FREE WATER) SOLN Place 200 mLs into feeding tube every 8 (eight) hours. 01/06/14   Elease Etienne, MD   Allergies  Allergen Reactions  . Aspirin     unk reaction  . Nsaids     unk reaction  . Penicillins     unk reaction. Pt has tolerated cephalosporins in the past.    FAMILY HISTORY:   indicated that his mother is alive. He indicated that his father is alive. He indicated that his brother is alive.  SOCIAL HISTORY:  reports that he has never smoked. He has never used smokeless tobacco. He reports that he does not drink alcohol or use illicit drugs.  REVIEW OF SYSTEMS:  NA  SUBJECTIVE:   VITAL SIGNS: Temp:  [98.6 F (37 C)-102.8 F (39.3 C)] 100 F (37.8 C) (10/16 0811) Pulse Rate:  [87-97] 96 (10/16 1000) Resp:  [18-41] 21 (10/16 1000) BP: (104-130)/(48-114) 112/56 mmHg (10/16 0945) SpO2:  [93 %-100 %] 97 % (10/16 1000) HEMODYNAMICS:   VENTILATOR SETTINGS:   INTAKE / OUTPUT:  Intake/Output Summary (Last 24 hours) at 01/10/14 1041 Last data filed at 01/10/14 1000  Gross per 24 hour  Intake 558.33 ml  Output      0 ml  Net 558.33 ml    PHYSICAL EXAMINATION: General:  Contracted 58 yo who comunicates with grunts and head nods Neuro:  Non verbal, quadriparetic l>r, follows commands, contracted extremiteis HEENT:  Stridor from retained oral secretions, no JVD/LAN Cardiovascular:  HSR RRR Lungs: +rhonchi Abdomen:  +peg. +bs Musculoskeletal:  Contractures as noted Skin:  Hot and dry  LABS:  CBC  Recent Labs Lab 01/05/14 0330 01/09/14 1954 01/10/14 0542  WBC 7.3 8.3 11.5*  HGB 8.6* 10.3* 9.2*  HCT 27.1* 31.2* 27.8*  PLT 310 344 401*   Coag's No results found for this basename: APTT, INR,  in the last 168 hours BMET  Recent Labs Lab 01/09/14 1954 01/10/14 0542  NA 132* 131*  K 3.7 3.8  CL 94* 93*  CO2 24 24  BUN 14 12  CREATININE 0.56 0.49*  GLUCOSE 93 94   Electrolytes  Recent Labs Lab 01/09/14 1954 01/10/14 0542  CALCIUM 9.0 9.2   Sepsis Markers  Recent Labs Lab 01/09/14 1954  LATICACIDVEN 1.3   ABG  Recent Labs Lab 01/10/14 0454  PHART 7.473*  PCO2ART 33.1*  PO2ART 67.0*   Liver Enzymes  Recent Labs Lab 01/09/14 1954 01/10/14 0542  AST 23 22  ALT 10 10  ALKPHOS 152* 151*  BILITOT 0.3 0.5  ALBUMIN  3.0* 3.2*   Cardiac Enzymes No results found for this basename: TROPONINI, PROBNP,  in the last 168 hours Glucose  Recent Labs Lab 01/03/14 1120 01/03/14 1647 01/03/14 2103 01/10/14 0435 01/10/14 0809  GLUCAP 135* 109* 105* 92 120*    Imaging Dg Chest 2 View  01/09/2014   CLINICAL DATA:  Fever. Recent diagnosis of urinary tract infection. Pain.  EXAM: CHEST  2 VIEW  COMPARISON:  01/05/2014  FINDINGS: No cardiomegaly. Unchanged aortic contours, distorted by rightward rotation.  There is chronic diffuse interstitial coarsening without pneumonia or overt edema. No effusion or pneumothorax. Stable biapical extrapleural thickening. 23 mm long triangular density overlapping the left mid chest is not definitively seen laterally and is presumably external to the patient.  IMPRESSION: Diffuse  interstitial coarsening which could be congestive or bronchitic.   Electronically Signed   By: Tiburcio Pea M.D.   On: 01/09/2014 21:15     ASSESSMENT / PLAN:  PULMONARY OETT A: Chronic bronchitis  P:   O2 as needed Pulmonary toilet Consider swallow eval if he can co operate  CARDIOVASCULAR CVL A:  Chronic hypotension secondary to adrenal inuff New fever (lactic acid 1.3) P:  Solucortef Check procal for completeness  RENAL A:  Hyper natremia now hyponatremic   Recent Labs Lab 01/09/14 1954 01/10/14 0542  NA 132* 131*    P:   DC free H2O (note he is on NS @100 ) Follow chemistries, will probably allow some slight hypernatremia  GASTROINTESTINAL A:   Dysphagia Peg Chronic ileus  P:   Monitor for ileus TF  HEMATOLOGIC A:   Chronic anemia Fe def P:  Follow cbc  INFECTIOUS A:   Fever in nursing home resident Recent UTI with proteus MRSA swab + this admit P:   BCx2 10/15>> UC 10/15>> 10/8 uc >>proteus Abx cefepime: , start date10/16, day 0/x Abx: flagyl, start date 10/16, day 0/x Abx: vanco, start date10/16, day 0/x    ENDOCRINE A:  Adrenal insuff P:    On solucortef No recent cortisol level  NEUROLOGIC A:   Left SDH with shift. Quadriparesis Decreased LOC  Seizure disorder  Parkinson Dz  P:   RASS goal: 1 Monitor in ICU for decreased LOC May need craniotomy per NS. Continue dilantin(level 14)   Family updated:  Family update at bedside.  Interdisciplinary Family Meeting v Palliative Care Meeting:    TODAY'S SUMMARY: 58 yo quadriparetic, snf resident, who has been admitted x 2 over the last 8 days. 10/8 with decreased LOC (chronic subdural hematomas) and on 10/16 with fever 102.5. Ct of head revealed increase in Left SDH with some shift and mass affect. Due to his complex PMH neurosurgery wants him in ICU for at least 48 hours and due to [facility] protocol PCCM will assume his care till he leaves ICU.    Brett Canales Minor ACNP [facility] PCCM Pager [phone] till 3 pm If no answer page [phone] 01/10/2014, 10:42 AM   I have personally obtained a history, examined the patient, evaluated laboratory and imaging results, formulated the assessment and plan and placed orders.  CRITICAL CARE: The patient is critically ill with multiple organ systems failure and requires high complexity decision making for assessment and support, frequent evaluation and titration of therapies, application of advanced monitoring technologies and extensive interpretation of multiple databases. Critical Care Time devoted to patient care services described in this note is 45  minutes.    Levy Pupa, MD, PhD 01/10/2014, 11:49 AM [facility] [phone] or if no answer [phone]

## 2014-01-11 DIAGNOSIS — A047 Enterocolitis due to Clostridium difficile: Secondary | ICD-10-CM

## 2014-01-11 DIAGNOSIS — R6520 Severe sepsis without septic shock: Secondary | ICD-10-CM

## 2014-01-11 DIAGNOSIS — A419 Sepsis, unspecified organism: Secondary | ICD-10-CM

## 2014-01-11 LAB — BASIC METABOLIC PANEL
Anion gap: 15 (ref 5–15)
BUN: 13 mg/dL (ref 6–23)
CALCIUM: 8.8 mg/dL (ref 8.4–10.5)
CHLORIDE: 96 meq/L (ref 96–112)
CO2: 21 mEq/L (ref 19–32)
CREATININE: 0.46 mg/dL — AB (ref 0.50–1.35)
Glucose, Bld: 113 mg/dL — ABNORMAL HIGH (ref 70–99)
Potassium: 3.9 mEq/L (ref 3.7–5.3)
Sodium: 132 mEq/L — ABNORMAL LOW (ref 137–147)

## 2014-01-11 LAB — CBC
HCT: 26.4 % — ABNORMAL LOW (ref 39.0–52.0)
Hemoglobin: 8.5 g/dL — ABNORMAL LOW (ref 13.0–17.0)
MCH: 23.3 pg — AB (ref 26.0–34.0)
MCHC: 32.2 g/dL (ref 30.0–36.0)
MCV: 72.3 fL — AB (ref 78.0–100.0)
PLATELETS: 353 10*3/uL (ref 150–400)
RBC: 3.65 MIL/uL — ABNORMAL LOW (ref 4.22–5.81)
RDW: 13.3 % (ref 11.5–15.5)
WBC: 8.5 10*3/uL (ref 4.0–10.5)

## 2014-01-11 LAB — VANCOMYCIN, TROUGH: VANCOMYCIN TR: 11.9 ug/mL (ref 10.0–20.0)

## 2014-01-11 LAB — URINE CULTURE
COLONY COUNT: NO GROWTH
Culture: NO GROWTH

## 2014-01-11 LAB — MAGNESIUM: Magnesium: 2.1 mg/dL (ref 1.5–2.5)

## 2014-01-11 LAB — PROCALCITONIN: Procalcitonin: <0.1 ng/mL

## 2014-01-11 LAB — GLUCOSE, CAPILLARY
GLUCOSE-CAPILLARY: 103 mg/dL — AB (ref 70–99)
GLUCOSE-CAPILLARY: 112 mg/dL — AB (ref 70–99)
Glucose-Capillary: 105 mg/dL — ABNORMAL HIGH (ref 70–99)

## 2014-01-11 LAB — PHOSPHORUS: Phosphorus: 3.4 mg/dL (ref 2.3–4.6)

## 2014-01-11 MED ORDER — HYDROCORTISONE NA SUCCINATE PF 100 MG IJ SOLR
50.0000 mg | Freq: Two times a day (BID) | INTRAMUSCULAR | Status: AC
  Administered 2014-01-12 – 2014-01-13 (×4): 50 mg via INTRAVENOUS
  Filled 2014-01-11 (×5): qty 1

## 2014-01-11 MED ORDER — ACETAMINOPHEN 325 MG PO TABS: 650.0000 mg | ORAL_TABLET | Freq: Four times a day (QID) | ORAL | Status: DC | PRN

## 2014-01-11 MED ORDER — CETYLPYRIDINIUM CHLORIDE 0.05 % MT LIQD
7.0000 mL | Freq: Two times a day (BID) | OROMUCOSAL | Status: DC
  Administered 2014-01-11 – 2014-01-17 (×13): 7 mL via OROMUCOSAL

## 2014-01-11 MED ORDER — SODIUM CHLORIDE 0.9 % IV SOLN
1250.0000 mg | Freq: Three times a day (TID) | INTRAVENOUS | Status: DC
  Filled 2014-01-11: qty 1250

## 2014-01-11 MED ORDER — VANCOMYCIN 50 MG/ML ORAL SOLUTION
500.0000 mg | Freq: Four times a day (QID) | ORAL | Status: DC
  Administered 2014-01-11 – 2014-01-17 (×23): 500 mg
  Filled 2014-01-11 (×34): qty 10

## 2014-01-11 MED ORDER — CHLORHEXIDINE GLUCONATE 0.12 % MT SOLN
15.0000 mL | Freq: Two times a day (BID) | OROMUCOSAL | Status: DC
  Administered 2014-01-11 – 2014-01-17 (×13): 15 mL via OROMUCOSAL
  Filled 2014-01-11 (×12): qty 15

## 2014-01-11 MED ORDER — ACETAMINOPHEN 650 MG RE SUPP: 650.0000 mg | Freq: Four times a day (QID) | RECTAL | Status: DC | PRN

## 2014-01-11 MED ORDER — SODIUM CHLORIDE 0.9 % IV SOLN: INTRAVENOUS | Status: DC

## 2014-01-11 NOTE — Progress Notes (Signed)
RASS 0. Moaning and grunting  Filed Vitals:   01/11/14 1000 01/11/14 1100 01/11/14 1200 01/11/14 1300  BP: 99/56 99/56 109/61 124/50  Pulse: 85 88 87 92  Temp:   98.9 F (37.2 C)   TempSrc:   Oral   Resp: 15 26 14    Height:      Weight:      SpO2: 98% 98% 100% 100%   NAD Chest clear RRR s M abd soft, +BS Ext warm Chronic contractures  I have reviewed all of today's lab results. Relevant abnormalities are discussed in the A/P section  No new CXR  IMPRESSION Severe sepsis C diff colitis Subacute SDH - followed by Dr Sherwood Gambler Chronic OBS Chronic hypotension - on fludrocortisone chronically Chronic G tube  PLAN: Cont enteral vanc DC IV vanc and cefepime Taper hydrocortisone as permitted by BP Cont rest of med rx as previously Transfer to SDU [facility] to assume care AM 10/18 and PCCM to sign off   Merton Border, MD ; [facility] [phone].  After 5:30 PM or weekends, call [phone]

## 2014-01-11 NOTE — Progress Notes (Signed)
Pt with vomiting x 2 at 8 pm. TF stopped and zofran given. Dr. Lincoln Maxin in [place] notified. Will keep tube feeding off for now. Plan to transfer to SDU.

## 2014-01-11 NOTE — Progress Notes (Signed)
Overall stable.  Fever better. No sz  Afeb VSS awake and aware minimally verbal ; F/C bilat UE/LE's stable spastic quadraparesis  Cont Observation reCT Monday

## 2014-01-11 NOTE — Progress Notes (Signed)
ANTIBIOTIC CONSULT NOTE - FOLLOW UP  Pharmacy Consult for Vancomycin + Cefepime Indication: rule out pneumonia  Allergies  Allergen Reactions  . Aspirin     unk reaction  . Nsaids     unk reaction  . Penicillins     unk reaction. Pt has tolerated cephalosporins in the past.    Patient Measurements: Height: 6\' 2"  (188 cm) Weight: 168 lb 3.4 oz (76.3 kg) IBW/kg (Calculated) : 82.2  Vital Signs: Temp: 98.9 F (37.2 C) (10/17 1200) Temp Source: Oral (10/17 1200) BP: 119/70 mmHg (10/17 0900) Pulse Rate: 82 (10/17 0900) Intake/Output from previous day: 10/16 0701 - 10/17 0700 In: 3538.3 [I.V.:2258.3; IV Piggyback:900] Out: 4782 [Urine:1485] Intake/Output from this shift: Total I/O In: 150 [Other:150] Out: 135 [Urine:135]  Labs:  Recent Labs  01/09/14 1954 01/10/14 0542 01/11/14 0229  WBC 8.3 11.5* 8.5  HGB 10.3* 9.2* 8.5*  PLT 344 401* 353  CREATININE 0.56 0.49* 0.46*   Estimated Creatinine Clearance: 109.9 ml/min (by C-G formula based on Cr of 0.46).  Recent Labs  01/11/14 1115  [place] 11.9     Microbiology: Recent Results (from the past 720 hour(s))  CULTURE, BLOOD (ROUTINE X 2)     Status: None   Collection Time    01/02/14 12:33 AM      Result Value Ref Range Status   Specimen Description BLOOD RIGHT HAND   Final   Special Requests BOTTLES DRAWN AEROBIC AND ANAEROBIC 5CC   Final   Culture  Setup Time     Final   Value: 01/02/2014 04:02     Performed at [facility]   Culture     Final   Value: NO GROWTH 5 DAYS     Performed at [facility]   Report Status 01/08/2014 FINAL   Final  CULTURE, BLOOD (ROUTINE X 2)     Status: None   Collection Time    01/02/14 12:33 AM      Result Value Ref Range Status   Specimen Description BLOOD LEFT HAND   Final   Special Requests BOTTLES DRAWN AEROBIC AND ANAEROBIC 5CC   Final   Culture  Setup Time     Final   Value: 01/02/2014 04:03     Performed at [facility]   Culture      Final   Value: NO GROWTH 5 DAYS     Performed at [facility]   Report Status 01/08/2014 FINAL   Final  URINE CULTURE     Status: None   Collection Time    01/02/14 12:35 AM      Result Value Ref Range Status   Specimen Description URINE, CATHETERIZED   Final   Special Requests Normal   Final   Culture  Setup Time     Final   Value: 01/02/2014 08:55     Performed at [place]     Final   Value: >=100,000 COLONIES/ML     Performed at [facility]   Culture     Final   Value: PROTEUS MIRABILIS     Performed at [facility]   Report Status 01/04/2014 FINAL   Final   Organism ID, Bacteria PROTEUS MIRABILIS   Final  MRSA PCR SCREENING     Status: None   Collection Time    01/02/14  3:59 AM      Result Value Ref Range Status   MRSA by PCR NEGATIVE  NEGATIVE  Final   Comment:            The GeneXpert MRSA Assay (FDA     approved for NASAL specimens     only), is one component of a     comprehensive MRSA colonization     surveillance program. It is not     intended to diagnose MRSA     infection nor to guide or     monitor treatment for     MRSA infections.  URINE CULTURE     Status: None   Collection Time    01/09/14  9:37 PM      Result Value Ref Range Status   Specimen Description URINE, CATHETERIZED   Final   Special Requests ADDED [id number]   Final   Culture  Setup Time     Final   Value: 01/10/2014 08:53     Performed at [place]     Final   Value: NO GROWTH     Performed at [facility]   Culture     Final   Value: NO GROWTH     Performed at [facility]   Report Status 01/11/2014 FINAL   Final  MRSA PCR SCREENING     Status: Abnormal   Collection Time    01/10/14  7:01 AM      Result Value Ref Range Status   MRSA by PCR POSITIVE (*) NEGATIVE Final   Comment:            The GeneXpert MRSA Assay (FDA     approved for NASAL specimens     only), is one component of a      comprehensive MRSA colonization     surveillance program. It is not     intended to diagnose MRSA     infection nor to guide or     monitor treatment for     MRSA infections.     CRITICAL RESULT CALLED TO, READ BACK BY AND VERIFIED WITH:     KSOR Q RN 01/10/14 0922 COSTELLO B  CLOSTRIDIUM DIFFICILE BY PCR     Status: Abnormal   Collection Time    01/10/14 10:00 AM      Result Value Ref Range Status   C difficile by pcr POSITIVE (*) NEGATIVE Final   Comment: CRITICAL RESULT CALLED TO, READ BACK BY AND VERIFIED WITH:     Gladstone Pih RN 12:40 01/10/14 ([email or web address])    Anti-infectives   Start     Dose/Rate Route Frequency Ordered Stop   01/11/14 2000  vancomycin (VANCOCIN) 1,250 mg in sodium chloride 0.9 % 250 mL IVPB     1,250 mg 166.7 mL/hr over 90 Minutes Intravenous Every 8 hours 01/11/14 1303     01/10/14 1300  vancomycin (VANCOCIN) 50 mg/mL oral solution 500 mg     500 mg Oral 4 times per day 01/10/14 1240     01/10/14 0900  vancomycin (VANCOCIN) IVPB 750 mg/150 ml premix  Status:  Discontinued     750 mg 150 mL/hr over 60 Minutes Intravenous Every 8 hours 01/10/14 0026 01/11/14 1303   01/10/14 0515  metroNIDAZOLE (FLAGYL) IVPB 500 mg     500 mg 100 mL/hr over 60 Minutes Intravenous Every 8 hours 01/10/14 0512     01/10/14 0045  metroNIDAZOLE (FLAGYL) IVPB 500 mg     500 mg 100 mL/hr over 60 Minutes Intravenous  Once 01/10/14 0030 01/10/14 0220   01/10/14  0030  ceFEPIme (MAXIPIME) 1 g in dextrose 5 % 50 mL IVPB     1 g 100 mL/hr over 30 Minutes Intravenous 3 times per day 01/10/14 0025     01/10/14 0030  vancomycin (VANCOCIN) IVPB 1000 mg/200 mL premix     1,000 mg 200 mL/hr over 60 Minutes Intravenous  Once 01/10/14 0026 01/10/14 0220      Assessment: 52 YOM with quadraplegia who continues on Vancomycin + Cefepime for r/o PNA. A measured Vancomycin trough this AM was SUBtherapeutic (VT 11.9 mcg/ml however AM dose given late - true trough likely lower, goal of 15-20  mcg/ml). SCr stable - 0.46 << 0.49, CrCl hard to estimate given quadraplegia. UOP/24h: 0.8 ml/kg/hr.   Goal of Therapy:  Vancomycin trough level 15-20 mcg/ml  Plan:  1. Increase Vancomycin to 1250 mg IV every 8 hours 2. Continue Cefepime 1g IV every 8 hours 3. Will continue to follow renal function, culture results, LOT, and antibiotic de-escalation plans   Alycia Rossetti, PharmD, BCPS Clinical Pharmacist Pager: [phone] 01/11/2014 1:06 PM

## 2014-01-11 NOTE — Progress Notes (Signed)
Subjective: Patient resting in bed, family not at bedside, nor in waiting room.  Nursing reports that he had an episode of vomiting yesterday, his tube feedings have not been started, he has been continued on IVF.  Objective: Vital signs in last 24 hours: Filed Vitals:   01/11/14 0359 01/11/14 0400 01/11/14 0500 01/11/14 0600  BP:  105/60 106/58 120/67  Pulse:  81 91 93  Temp: 98.3 F (36.8 C)     TempSrc: Oral     Resp:  28 28 24   Height:      Weight:   76.3 kg (168 lb 3.4 oz)   SpO2:  97% 100% 100%    Intake/Output from previous day: 10/16 0701 - 10/17 0700 In: 3438.3 [I.V.:2258.3; IV Piggyback:800] Out: 7846 [Urine:1485] Intake/Output this shift:    Physical Exam:  No fever for 24 hours. Awake and alert, grunting verbalizations, following commands with all 4 extremities. Pupils 2 mm, round, reactive to light. EOMI. face symmetrical. Spastic quadriparesis.  CBC  Recent Labs  01/10/14 0542 01/11/14 0229  WBC 11.5* 8.5  HGB 9.2* 8.5*  HCT 27.8* 26.4*  PLT 401* 353   BMET  Recent Labs  01/10/14 0542 01/11/14 0229  NA 131* 132*  K 3.8 3.9  CL 93* 96  CO2 24 21  GLUCOSE 94 113*  BUN 12 13  CREATININE 0.49* 0.46*  CALCIUM 9.2 8.8   ABG    Component Value Date/Time   PHART 7.473* 01/10/2014 0454   PCO2ART 33.1* 01/10/2014 0454   PO2ART 67.0* 01/10/2014 0454   HCO3 24.3* 01/10/2014 0454   TCO2 25 01/10/2014 0454   ACIDBASEDEF 1.3 01/02/2014 0210   O2SAT 94.0 01/10/2014 0454    Studies/Results: Dg Chest 2 View  01/09/2014   CLINICAL DATA:  Fever. Recent diagnosis of urinary tract infection. Pain.  EXAM: CHEST  2 VIEW  COMPARISON:  01/05/2014  FINDINGS: No cardiomegaly. Unchanged aortic contours, distorted by rightward rotation.  There is chronic diffuse interstitial coarsening without pneumonia or overt edema. No effusion or pneumothorax. Stable biapical extrapleural thickening. 23 mm long triangular density overlapping the left mid chest is not  definitively seen laterally and is presumably external to the patient.  IMPRESSION: Diffuse interstitial coarsening which could be congestive or bronchitic.   Electronically Signed   By: Jorje Guild M.D.   On: 01/09/2014 21:15   Ct Head Wo Contrast  01/10/2014   CLINICAL DATA:  History of nontraumatic subdural hematoma. Increasing pain. Initial encounter.  EXAM: CT HEAD WITHOUT CONTRAST  TECHNIQUE: Contiguous axial images were obtained from the base of the skull through the vertex without intravenous contrast.  COMPARISON:  01/06/2014  FINDINGS: Skull and Sinuses:Negative for fracture or destructive process. The mastoids, middle ears, and imaged paranasal sinuses are clear.  Orbits: No acute abnormality.  Brain: There is a mixed density subdural collection around the left cerebral convexity which is increased from before. The collection is predominantly isodense to hypodense, compatible with proteinaceous fluid. There are also thin high-density areas that could represent recent hemorrhage or septations. The scalloped appearance of the collection superiorly is also consistent with loculation. Rightward midline shift is increased, currently 15 mm previously 10 mm. There is marked subfalcine herniation and early left uncal herniation which was also present previously. No evidence of ACA infarct or ventricular entrapment.  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 The brain is globally atrophic. No evidence of acute  infarct or hydrocephalus.  Critical Value/emergent results were called by telephone at the time of interpretation on 01/10/2014 at 12:17 am to Dr. Ashok Cordia, who verbally acknowledged these results.  IMPRESSION: 1. Increased mixed density subdural hematoma around the left cerebral convexity. Midline shift has increased to 15 mm, with associated herniations described above. 2.  Atrophy and chronic putaminal necrosis, as above.   Electronically Signed   By: Jorje Guild M.D.   On: 01/10/2014 00:24   Ct Abdomen Pelvis W Contrast  01/10/2014   CLINICAL DATA:  Abdominal tenderness, pain and fever.  EXAM: CT ABDOMEN AND PELVIS WITH CONTRAST  TECHNIQUE: Multidetector CT imaging of the abdomen and pelvis was performed using the standard protocol following bolus administration of intravenous contrast.  CONTRAST:  195mL OMNIPAQUE IOHEXOL 300 MG/ML  SOLN  COMPARISON:  CT abdomen pelvis - 06/15/2012; lumbar spine CT -01/02/2014  FINDINGS: Normal hepatic contour. No discrete hepatic lesions. Normal appearance of the gallbladder. No radiopaque gallstones. No intra extrahepatic biliary duct dilatation. No ascites.  There is symmetric enhancement and excretion of the bilateral kidneys. Note is made of an approximately 2.5 cm hypo attenuating (-8 Hounsfield unit) partially exophytic left-sided renal cyst. No discrete right-sided renal lesions. No definite renal stones on this postcontrast examination. No urinary obstruction or perinephric stranding.  Normal appearance of the bilateral adrenal glands, pancreas and spleen.  Enteric contrast extends to the level of the proximal ascending colon. A gastrostomy tube balloon is appropriately insufflated within the mid body of the stomach. There is diffuse wall thickening involving the distal sigmoid colon (image 72, series 21), extending to the level of the rectum. The sigmoid colon is noted to be redundant but otherwise normal in appearance. Normal appearance of the appendix. No pneumoperitoneum, pneumatosis or portal venous gas.  Scattered mixed calcified and noncalcified atherosclerotic plaque with a normal caliber abdominal aorta. The major branch vessels of the abdominal aorta appear widely patent on this non CTA examination though exuberant calcification is noted involving the origin of the solitary right renal artery.  No retroperitoneal,  mesenteric pelvic or inguinal lymphadenopathy. No retroperitoneal, mesenteric  Limited visualization of the lower thorax demonstrates minimal dependent ground-glass atelectasis. No discrete focal airspace opacities. No pleural effusion.  Normal heart size. No pericardial effusion. Coronary artery calcifications.  No acute or aggressive osseus abnormalities. Grossly unchanged moderate (approximately 30%) compression deformity involving the L5 vertebral body, and mild (under 25%) compression deformity involving the superior endplates of the H85 and T12 vertebral bodies.  IMPRESSION: 1. Diffuse wall thickening involving the distal sigmoid colon extending through the level of the rectum, the etiology of which is not depicted as examination with differential considerations including infectious, inflammatory and (less likely) ischemic etiologies. These findings are associated with adjacent mesenteric stranding but without evidence of perforation or definable/drainable fluid collection. No enteric obstruction. 2. Appropriately positioned gastrostomy tube. 3. Grossly unchanged moderate compression deformity involving the L5 vertebral body and mild compression deformities involving the T11 and T12 vertebral bodies. Correlation with report from lumbar spine CT performed 01/02/2014 is recommended.   Electronically Signed   By: Sandi Mariscal M.D.   On: 01/10/2014 00:20   Dg Chest Port 1 View  01/10/2014   CLINICAL DATA:  Respiratory failure with hypoxia.  EXAM: PORTABLE CHEST - 1 VIEW  COMPARISON:  January 09, 2014.  FINDINGS: Stable cardiomediastinal silhouette. No pneumothorax is noted. Stable chronic diffuse interstitial coarsening is noted. Mild left basilar opacity is noted concerning for subsegmental atelectasis or pneumonia. Possible  mild associated pleural effusion may be present. Bony thorax appears intact.  IMPRESSION: Mild left basilar opacity is noted concerning for pneumonia or subsegmental atelectasis, with  possible associated pleural effusion. Follow-up radiographs are recommended.   Electronically Signed   By: Sabino Dick M.D.   On: 01/10/2014 13:20    Assessment/Plan: Patient back to baseline neurologically. Cause of fevers remain uncertain. CT of the abdomen and pelvis findings not clearly addressed by THS/CCM. It is unclear to me whether this represents a possible source of his fevers. WBC count normalized. Continues on IV antibiotics.  From a neurosurgical perspective, for now I favor continued monitoring, and repeating a CT of the brain without contrast on Monday, October 19 a.m.   Hosie Spangle, MD 01/11/2014, 7:17 AM

## 2014-01-12 LAB — PROCALCITONIN: Procalcitonin: <0.1 ng/mL

## 2014-01-12 NOTE — Progress Notes (Signed)
[facility] TEAM 1 - Stepdown/ICU TEAM Progress Note  [name] QIO:[id number] DOB: Oct 25, 1955 DOA: 01/09/2014 PCP: [name], MD  Admit HPI / Brief Narrative: 58 y.o. male with history of quadriplegia, seizures, and adrenal insufficiency who was admitted for subdural hematoma 10/08 - 10/12 who was brought to the ER because of fever. Patient's mother noticed that patient was becoming increasingly lethargic.  In the ER CT of the abdom noted inflammatory changes in the sigmoid and rectal area. CT of the head noted worsening of the known subdural hematoma with worsening midline shift and herniation.  Neurosurgery was consulted, and the pt was admitted to the ICU, w/ PCCM assuming his care.    Since admission, the pt has been found to have C diff colitis, w/ positive PCR and CT abdom noting colitis.  His SDH has not required intervention.    HPI/Subjective: Pt opens eyes to voice/touch, but is non communicative otherwise.    Assessment/Plan:  Severe sepsis due to C diff colitis  Cont enteral vanc via feeding tube - no clinical evidence of megacolon at this time   Subacute L SDH w/ midline shift  NS following - for repeat CT head in AM   Chronic OBS - Quadriplegia - Chronic G tube PEG tube dependent  Chronic illeus  Parkinson's disease  Sz d/o No evidence of active seizures   Chronic adrenal insuff / hypotension on fludrocortisone chronically, and currently on stress dose hydrocortisone - BP stable - taper steroid as able   MRSA screen +  Code Status: FULL Family Communication: no family present at time of exam Disposition Plan: SDU  Consultants: PCCM > TRH NS  Procedures: none  Antibiotics: Cefepime 10/15 > 10/16 IV Vanc 10/15 > 10/17 Flagyl 10/15 > 10/17 Oral Vanc 10/16 >  DVT prophylaxis: SCDs  Objective: Blood pressure 117/54, pulse 94, temperature 99.1 F (37.3 C), temperature source Axillary, resp. rate 27, height 6\' 2"  (1.88 m), weight  76.6 kg (168 lb 14 oz), SpO2 99.00%.  Intake/Output Summary (Last 24 hours) at 01/12/14 0924 Last data filed at 01/12/14 0600  Gross per 24 hour  Intake 609.25 ml  Output   1665 ml  Net -1055.75 ml   Exam: General: No acute respiratory distress Lungs: Clear to auscultation bilaterally without wheezes or crackles - poor air movement in B bases Cardiovascular: Regular rate and rhythm without murmur gallop or rub normal S1 and S2 Abdomen: Mildly distended, PEG insertion clean and dry, soft, bowel sounds positive, no rebound, no appreciable mass Extremities: No significant cyanosis, clubbing, or edema bilateral lower extremities  Data Reviewed: Basic Metabolic Panel:  Recent Labs Lab 01/09/14 1954 01/10/14 0542 01/11/14 0229  NA 132* 131* 132*  K 3.7 3.8 3.9  CL 94* 93* 96  CO2 24 24 21   GLUCOSE 93 94 113*  BUN 14 12 13   CREATININE 0.56 0.49* 0.46*  CALCIUM 9.0 9.2 8.8  MG  --   --  2.1  PHOS  --   --  3.4    Liver Function Tests:  Recent Labs Lab 01/09/14 1954 01/10/14 0542  AST 23 22  ALT 10 10  ALKPHOS 152* 151*  BILITOT 0.3 0.5  PROT 8.3 8.8*  ALBUMIN 3.0* 3.2*   CBC:  Recent Labs Lab 01/09/14 1954 01/10/14 0542 01/11/14 0229  WBC 8.3 11.5* 8.5  NEUTROABS  --  7.0  --   HGB 10.3* 9.2* 8.5*  HCT 31.2* 27.8* 26.4*  MCV 72.4* 72.2* 72.3*  PLT 344 401*  353    CBG:  Recent Labs Lab 01/10/14 1604 01/10/14 1950 01/11/14 0002 01/11/14 0400 01/11/14 1147  GLUCAP 122* 129* 103* 105* 112*    Recent Results (from the past 240 hour(s))  CULTURE, BLOOD (ROUTINE X 2)     Status: None   Collection Time    01/09/14  7:49 PM      Result Value Ref Range Status   Specimen Description BLOOD ARM RIGHT   Final   Special Requests BOTTLES DRAWN AEROBIC AND ANAEROBIC 5CC   Final   Culture  Setup Time     Final   Value: 01/10/2014 01:07     Performed at [facility]   Culture     Final   Value:        BLOOD CULTURE RECEIVED NO GROWTH TO DATE CULTURE  WILL BE HELD FOR 5 DAYS BEFORE ISSUING A FINAL NEGATIVE REPORT     Performed at [facility]   Report Status PENDING   Incomplete  CULTURE, BLOOD (ROUTINE X 2)     Status: None   Collection Time    01/09/14  8:12 PM      Result Value Ref Range Status   Specimen Description BLOOD HAND RIGHT   Final   Special Requests BOTTLES DRAWN AEROBIC AND ANAEROBIC 5CC   Final   Culture  Setup Time     Final   Value: 01/10/2014 01:08     Performed at [facility]   Culture     Final   Value:        BLOOD CULTURE RECEIVED NO GROWTH TO DATE CULTURE WILL BE HELD FOR 5 DAYS BEFORE ISSUING A FINAL NEGATIVE REPORT     Performed at [facility]   Report Status PENDING   Incomplete  URINE CULTURE     Status: None   Collection Time    01/09/14  9:37 PM      Result Value Ref Range Status   Specimen Description URINE, CATHETERIZED   Final   Special Requests ADDED [id number]   Final   Culture  Setup Time     Final   Value: 01/10/2014 08:53     Performed at [facility]   Colony Count     Final   Value: NO GROWTH     Performed at [facility]   Culture     Final   Value: NO GROWTH     Performed at [facility]   Report Status 01/11/2014 FINAL   Final  MRSA PCR SCREENING     Status: Abnormal   Collection Time    01/10/14  7:01 AM      Result Value Ref Range Status   MRSA by PCR POSITIVE (*) NEGATIVE Final   Comment:            The GeneXpert MRSA Assay (FDA     approved for NASAL specimens     only), is one component of a     comprehensive MRSA colonization     surveillance program. It is not     intended to diagnose MRSA     infection nor to guide or     monitor treatment for     MRSA infections.     CRITICAL RESULT CALLED TO, READ BACK BY AND VERIFIED WITH:     KSOR Q RN 01/10/14 0922 COSTELLO B  CLOSTRIDIUM DIFFICILE BY PCR     Status: Abnormal   Collection Time  01/10/14 10:00 AM      Result Value Ref Range Status   C difficile by pcr  POSITIVE (*) NEGATIVE Final   Comment: CRITICAL RESULT CALLED TO, READ BACK BY AND VERIFIED WITH:     Saralyn Pilar RN 12:40 01/10/14 ([email or web address])     Studies:  Recent x-ray studies have been reviewed in detail by the Attending Physician  Scheduled Meds:  Scheduled Meds: . antiseptic oral rinse  7 mL Mouth Rinse q12n4p  . baclofen  10 mg Per Tube TID  . chlorhexidine  15 mL Mouth Rinse BID  . Chlorhexidine Gluconate Cloth  6 each Topical Q0600  . feeding supplement (PRO-STAT SUGAR FREE 64)  30 mL Per Tube TID  . ferrous sulfate  300 mg Per Tube q morning - 10a  . fludrocortisone  0.1 mg Per Tube QPM  . folic acid  1 mg Per Tube q morning - 10a  . hydrocortisone sod succinate (SOLU-CORTEF) inj  50 mg Intravenous Q12H  . mupirocin ointment  1 application Nasal BID  . olopatadine  1 drop Both Eyes q morning - 10a  . phenytoin  125 mg Per Tube BID  . polyvinyl alcohol  1 drop Both Eyes Daily  . saccharomyces boulardii  250 mg Oral BID  . senna  2 tablet Per Tube Daily  . vancomycin  500 mg Per Tube 4 times per day    Time spent on care of this patient: 35 mins   Bernell Chase T , MD   [facility] Office  [phone] Pager - Text Page per Loretha Stapler as per below:  On-Call/Text Page:      [email or web address]      password [email or web address]  If 7PM-7AM, please contact night-coverage [email or web address] Password [email or web address] 01/12/2014, 9:24 AM   LOS: 3 days

## 2014-01-13 ENCOUNTER — Inpatient Hospital Stay (HOSPITAL_COMMUNITY): Payer: Medicare Other

## 2014-01-13 LAB — COMPREHENSIVE METABOLIC PANEL
ALBUMIN: 2.9 g/dL — AB (ref 3.5–5.2)
ALK PHOS: 120 U/L — AB (ref 39–117)
ALT: 14 U/L (ref 0–53)
ANION GAP: 13 (ref 5–15)
AST: 36 U/L (ref 0–37)
BILIRUBIN TOTAL: 0.3 mg/dL (ref 0.3–1.2)
BUN: 15 mg/dL (ref 6–23)
CHLORIDE: 100 meq/L (ref 96–112)
CO2: 26 mEq/L (ref 19–32)
Calcium: 8.9 mg/dL (ref 8.4–10.5)
Creatinine, Ser: 0.47 mg/dL — ABNORMAL LOW (ref 0.50–1.35)
GFR calc Af Amer: >90 mL/min (ref >90)
GFR calc non Af Amer: >90 mL/min (ref >90)
GLUCOSE: 99 mg/dL (ref 70–99)
Potassium: 3.2 mEq/L — ABNORMAL LOW (ref 3.7–5.3)
SODIUM: 139 meq/L (ref 137–147)
Total Protein: 8.2 g/dL (ref 6.0–8.3)

## 2014-01-13 LAB — APTT: aPTT: 40 seconds — ABNORMAL HIGH (ref 24–37)

## 2014-01-13 LAB — OCCULT BLOOD X 1 CARD TO LAB, STOOL: Fecal Occult Bld: NEGATIVE

## 2014-01-13 LAB — PROTIME-INR
INR: 1.47 (ref 0.00–1.49)
Prothrombin Time: 18 seconds — ABNORMAL HIGH (ref 11.6–15.2)

## 2014-01-13 MED ORDER — POTASSIUM CHLORIDE 20 MEQ/15ML (10%) PO LIQD
20.0000 meq | Freq: Three times a day (TID) | ORAL | Status: AC
  Administered 2014-01-13 (×3): 20 meq
  Filled 2014-01-13 (×3): qty 15

## 2014-01-13 MED ORDER — FREE WATER
200.0000 mL | Freq: Three times a day (TID) | Status: DC
  Administered 2014-01-13 – 2014-01-15 (×7): 200 mL

## 2014-01-13 MED ORDER — JEVITY 1.2 CAL PO LIQD: 1000.0000 mL | ORAL | Status: DC

## 2014-01-13 MED ORDER — PREDNISONE 5 MG PO TABS
10.0000 mg | ORAL_TABLET | Freq: Two times a day (BID) | ORAL | Status: DC
  Administered 2014-01-14 – 2014-01-17 (×7): 10 mg
  Filled 2014-01-13 (×7): qty 2

## 2014-01-13 NOTE — Clinical Social Work Psychosocial (Signed)
Clinical Social Work Department BRIEF PSYCHOSOCIAL ASSESSMENT 01/13/2014  Patient:  [name], [name]     Account Number:  [id number]     Admit date:  01/09/2014  Clinical Social Worker:  Marciano Sequin  Date/Time:  01/13/2014 04:20 PM  Referred by:  RN  Date Referred:  01/13/2014 Referred for  SNF Placement   Other Referral:   Interview type:  Patient Other interview type:   Pt is not oriented; pt's mother Mihail, [place] [phone], [phone]    PSYCHOSOCIAL DATA Living Status:  FACILITY Admitted from facility:  [facility] Level of care:  Long Term Acute Primary support name:  Ellenwood,[name] Primary support relationship to patient:  PARENT Degree of support available:   strong    CURRENT CONCERNS  Other Concerns:    SOCIAL WORK ASSESSMENT / PLAN Clinical Social Worker spoke with patient's mother and father at the bedside. Patient family states that patient is a long term resident at [facility].  Patient's mother repoorted that she is interested in placing the pt in another SNFs. CSW and patient's mother discussed the process for transitioning a long-term care patient to another SNF. Patient's mother reported that she will reseach other SNFs, but the patient can return back to [facility]. CSW will contoine to assist and aid in discharge needs.   Assessment/plan status:  Psychosocial Support/Ongoing Assessment of Needs Other assessment/ plan:   Information/referral to community resources:   SNF List    PATIENT'S/FAMILY'S RESPONSE TO PLAN OF CARE: Pt's parents agreed with plan.   [place], MSW, [place]

## 2014-01-13 NOTE — Progress Notes (Signed)
[place] TEAM 1 - Stepdown/ICU TEAM Progress Note  [name] WGN:[id number] DOB: 1955-07-22 DOA: 01/09/2014 PCP: [name], MD  Admit HPI / Brief Narrative: 58 y.o. male with history of quadriplegia, seizures, and adrenal insufficiency who was admitted for subdural hematoma 10/08 - 10/12 who was brought to the ER because of fever. Patient's mother noticed that patient was becoming increasingly lethargic.  In the ER CT of the abdom noted inflammatory changes in the sigmoid and rectal area. CT of the head noted worsening of the known subdural hematoma with worsening midline shift and herniation.  Neurosurgery was consulted, and the pt was admitted to the ICU, w/ PCCM assuming his care.    Since admission, the pt has been found to have C diff colitis, w/ positive PCR and CT abdom noting colitis.  His SDH has not required intervention.    HPI/Subjective: Pt is alert, and will follow examiner around room w/ his eyes.  He is non-communicative.    Assessment/Plan:  Severe sepsis due to C diff colitis  Cont enteral vanc via feeding tube - no clinical evidence of megacolon at this time - sepsis physiology has resolved   Subacute L SDH w/ midline shift  NS following - repeat CT head suggests stability in size    Hypokalemia Likely due to GI losses - replace and follow - Mg is normla   Chronic OBS - Quadriplegia - Chronic G tube PEG tube dependent  Chronic illeus  Parkinson's disease  Sz d/o No evidence of active seizures   Chronic adrenal insuff / hypotension on fludrocortisone chronically, and currently on stress dose hydrocortisone - BP stable - taper steroid as able   MRSA screen +  Code Status: FULL Family Communication: no family present at time of exam Disposition Plan: stable for transfer to medical bed  Consultants: PCCM > TRH NS  Procedures: none  Antibiotics: Cefepime 10/15 > 10/16 IV Vanc 10/15 > 10/17 Flagyl 10/15 > 10/17 Oral Vanc 10/16  >  DVT prophylaxis: SCDs  Objective: Blood pressure 124/66, pulse 79, temperature 98.5 F (36.9 C), temperature source Oral, resp. rate 29, height 6\' 2"  (1.88 m), weight 74.4 kg (164 lb 0.4 oz), SpO2 100.00%.  Intake/Output Summary (Last 24 hours) at 01/13/14 1033 Last data filed at 01/13/14 0600  Gross per 24 hour  Intake    180 ml  Output    727 ml  Net   -547 ml   Exam: General: No acute respiratory distress Lungs: Clear to auscultation bilaterally without wheezes or crackles - poor air movement in B bases Cardiovascular: Regular rate and rhythm without murmur gallop or rub  Abdomen: Mildly distended, PEG insertion clean and dry, soft, bowel sounds positive, no rebound, no appreciable mass Extremities: No significant cyanosis, clubbing, edema bilateral lower extremities  Data Reviewed: Basic Metabolic Panel:  Recent Labs Lab 01/09/14 1954 01/10/14 0542 01/11/14 0229 01/13/14 0330  NA 132* 131* 132* 139  K 3.7 3.8 3.9 3.2*  CL 94* 93* 96 100  CO2 24 24 21 26   GLUCOSE 93 94 113* 99  BUN 14 12 13 15   CREATININE 0.56 0.49* 0.46* 0.47*  CALCIUM 9.0 9.2 8.8 8.9  MG  --   --  2.1  --   PHOS  --   --  3.4  --     Liver Function Tests:  Recent Labs Lab 01/09/14 1954 01/10/14 0542 01/13/14 0330  AST 23 22 36  ALT 10 10 14   ALKPHOS 152* 151* 120*  BILITOT 0.3 0.5 0.3  PROT 8.3 8.8* 8.2  ALBUMIN 3.0* 3.2* 2.9*   CBC:  Recent Labs Lab 01/09/14 1954 01/10/14 0542 01/11/14 0229  WBC 8.3 11.5* 8.5  NEUTROABS  --  7.0  --   HGB 10.3* 9.2* 8.5*  HCT 31.2* 27.8* 26.4*  MCV 72.4* 72.2* 72.3*  PLT 344 401* 353    CBG:  Recent Labs Lab 01/10/14 1604 01/10/14 1950 01/11/14 0002 01/11/14 0400 01/11/14 1147  GLUCAP 122* 129* 103* 105* 112*    Recent Results (from the past 240 hour(s))  CULTURE, BLOOD (ROUTINE X 2)     Status: None   Collection Time    01/09/14  7:49 PM      Result Value Ref Range Status   Specimen Description BLOOD ARM RIGHT   Final    Special Requests BOTTLES DRAWN AEROBIC AND ANAEROBIC 5CC   Final   Culture  Setup Time     Final   Value: 01/10/2014 01:07     Performed at [facility]   Culture     Final   Value:        BLOOD CULTURE RECEIVED NO GROWTH TO DATE CULTURE WILL BE HELD FOR 5 DAYS BEFORE ISSUING A FINAL NEGATIVE REPORT     Performed at [facility]   Report Status PENDING   Incomplete  CULTURE, BLOOD (ROUTINE X 2)     Status: None   Collection Time    01/09/14  8:12 PM      Result Value Ref Range Status   Specimen Description BLOOD HAND RIGHT   Final   Special Requests BOTTLES DRAWN AEROBIC AND ANAEROBIC 5CC   Final   Culture  Setup Time     Final   Value: 01/10/2014 01:08     Performed at [facility]   Culture     Final   Value:        BLOOD CULTURE RECEIVED NO GROWTH TO DATE CULTURE WILL BE HELD FOR 5 DAYS BEFORE ISSUING A FINAL NEGATIVE REPORT     Performed at [facility]   Report Status PENDING   Incomplete  URINE CULTURE     Status: None   Collection Time    01/09/14  9:37 PM      Result Value Ref Range Status   Specimen Description URINE, CATHETERIZED   Final   Special Requests ADDED [id number]   Final   Culture  Setup Time     Final   Value: 01/10/2014 08:53     Performed at [facility]   Colony Count     Final   Value: NO GROWTH     Performed at [facility]   Culture     Final   Value: NO GROWTH     Performed at [facility]   Report Status 01/11/2014 FINAL   Final  MRSA PCR SCREENING     Status: Abnormal   Collection Time    01/10/14  7:01 AM      Result Value Ref Range Status   MRSA by PCR POSITIVE (*) NEGATIVE Final   Comment:            The GeneXpert MRSA Assay (FDA     approved for NASAL specimens     only), is one component of a     comprehensive MRSA colonization     surveillance program. It is not     intended to diagnose MRSA  infection nor to guide or     monitor treatment for     MRSA infections.      CRITICAL RESULT CALLED TO, READ BACK BY AND VERIFIED WITH:     KSOR Q RN 01/10/14 0922 COSTELLO B  CLOSTRIDIUM DIFFICILE BY PCR     Status: Abnormal   Collection Time    01/10/14 10:00 AM      Result Value Ref Range Status   C difficile by pcr POSITIVE (*) NEGATIVE Final   Comment: CRITICAL RESULT CALLED TO, READ BACK BY AND VERIFIED WITH:     Saralyn Pilar RN 12:40 01/10/14 ([email or web address])     Studies:  Recent x-ray studies have been reviewed in detail by the Attending Physician  Scheduled Meds:  Scheduled Meds: . antiseptic oral rinse  7 mL Mouth Rinse q12n4p  . baclofen  10 mg Per Tube TID  . chlorhexidine  15 mL Mouth Rinse BID  . Chlorhexidine Gluconate Cloth  6 each Topical Q0600  . feeding supplement (PRO-STAT SUGAR FREE 64)  30 mL Per Tube TID  . ferrous sulfate  300 mg Per Tube q morning - 10a  . fludrocortisone  0.1 mg Per Tube QPM  . folic acid  1 mg Per Tube q morning - 10a  . hydrocortisone sod succinate (SOLU-CORTEF) inj  50 mg Intravenous Q12H  . mupirocin ointment  1 application Nasal BID  . olopatadine  1 drop Both Eyes q morning - 10a  . phenytoin  125 mg Per Tube BID  . polyvinyl alcohol  1 drop Both Eyes Daily  . saccharomyces boulardii  250 mg Oral BID  . senna  2 tablet Per Tube Daily  . vancomycin  500 mg Per Tube 4 times per day    Time spent on care of this patient: 35 mins   MCCLUNG,JEFFREY T , MD   [facility] Office  [phone] Pager - Text Page per Loretha Stapler as per below:  On-Call/Text Page:      [email or web address]      password [email or web address]  If 7PM-7AM, please contact night-coverage [email or web address] Password [email or web address] 01/13/2014, 10:33 AM   LOS: 4 days

## 2014-01-13 NOTE — Consult Note (Addendum)
WOC wound consult note Reason for Consult: Consult requested to assess buttocks.  Wound type: Darker pigmented skin across bilat buttocks and lower thighs; affected areas approx 30X30cm.  Appearance consistent with a deep tissue injury.  Periwound: No odor or drainage, unknown etiology.  Pt is frequently incontinent of dark black, tarry liquid stool.   Dressing procedure/placement/frequency: Barrier cream to protect and repel moisture.  Air mattress to reduce pressure to affected areas.   No family present to discuss plan of care and pt does not verbalize. Please re-consult if further assistance is needed.  Thank-you,  Julien Girt MSN, [facility], [place], [place], [place]

## 2014-01-13 NOTE — Care Management Note (Signed)
    Page 1 of 1   01/13/2014     9:43:13 AM CARE MANAGEMENT NOTE 01/13/2014  Patient:  [name], [name]   Account Number:  [id number]  Date Initiated:  01/10/2014  Documentation initiated by:  Elissa Hefty  Subjective/Objective Assessment:   adm w fever     Action/Plan:   from nsg facility   Anticipated DC Date:     Anticipated DC Plan:  [facility] referral  Clinical Social Worker         Choice offered to / List presented to:             Status of service:   Medicare Important Message given?  YES (If response is "NO", the following Medicare IM given date fields will be blank) Date Medicare IM given:  01/13/2014 Medicare IM given by:  Elissa Hefty Date Additional Medicare IM given:   Additional Medicare IM given by:    Discharge Disposition:    Per UR Regulation:  Reviewed for med. necessity/level of care/duration of stay  If discussed at [place] of Stay Meetings, dates discussed:   01/14/2014    Comments:

## 2014-01-13 NOTE — Progress Notes (Signed)
Patient has arrived on the unit.

## 2014-01-13 NOTE — Progress Notes (Signed)
Subjective: Patient resting in bed comfortably. Parents not in waiting room are at bedside. CT brain without contrast this morning again shows left hemispheric chronic subdural hematoma with mild mass effect and shift.  Objective: Vital signs in last 24 hours: Filed Vitals:   01/13/14 0312 01/13/14 0400 01/13/14 0500 01/13/14 0600  BP: 120/66     Pulse: 80 54 82 76  Temp: 98.2 F (36.8 C)     TempSrc: Oral     Resp: 24 22 28 30   Height:      Weight: 74.4 kg (164 lb 0.4 oz)     SpO2: 100% 96% 95% 98%    Intake/Output from previous day: 10/18 0701 - 10/19 0700 In: 180  Out: 727 [Urine:725; Stool:2] Intake/Output this shift:    Physical Exam:  Neurologic examination remained stable:  Awake and alert, grunting verbalizations. Follows commands. Spastic quadriparesis. Moves all 4 extremities to command, with ability for specific right and left distinction.  CBC  Recent Labs  01/11/14 0229  WBC 8.5  HGB 8.5*  HCT 26.4*  PLT 353   BMET  Recent Labs  01/11/14 0229 01/13/14 0330  NA 132* 139  K 3.9 3.2*  CL 96 100  CO2 21 26  GLUCOSE 113* 99  BUN 13 15  CREATININE 0.46* 0.47*  CALCIUM 8.8 8.9   ABG    Component Value Date/Time   PHART 7.473* 01/10/2014 0454   PCO2ART 33.1* 01/10/2014 0454   PO2ART 67.0* 01/10/2014 0454   HCO3 24.3* 01/10/2014 0454   TCO2 25 01/10/2014 0454   ACIDBASEDEF 1.3 01/02/2014 0210   O2SAT 94.0 01/10/2014 0454    Studies/Results: Ct Head Wo Contrast  01/13/2014   CLINICAL DATA:  Follow-up LEFT subdural hematoma. History of seizure and adrenal insufficiency.  EXAM: CT HEAD WITHOUT CONTRAST  TECHNIQUE: Contiguous axial images were obtained from the base of the skull through the vertex without intravenous contrast.  COMPARISON:  CT of the head January 09, 2014  FINDINGS: Similar to slightly decreased heterogeneous LEFT holohemispheric subdural hematoma. 12 mm LEFT-to-RIGHT midline shift with partial effacement LEFT lateral ventricle. No  hydrocephalus or CT findings of entrapment. No rebleed. No intraparenchymal hemorrhage. No acute large vascular territory infarct. Small remote bilateral basal ganglia lacunar infarcts. Moderate generalized global parenchymal brain volume loss for age.  Prominent LEFT posterior fossa extra-axial fluid collection may reflect remote subdural hematoma, unchanged. Basal cisterns are patent.  Mild paranasal sinus mucosal thickening without air-fluid levels. Mastoid air cells are well aerated. No skull fracture.  IMPRESSION: Similar to slightly decreased size of LEFT hole atmospheric acute on chronic subdural hematoma cauda 12 mm LEFT-to-RIGHT midline shift, decreased. No CT findings is hydrocephalus/entrapment.  Remote bilateral basal ganglia lacunar infarcts.  Global parenchymal brain volume loss, advanced for age.   Electronically Signed   By: Elon Alas   On: 01/13/2014 05:16    Assessment/Plan: Patient presented a week and a half ago with decreased responsiveness and UTI. CT of brain without contrast revealed incidental chronic subdural hematoma. Patient stabilized neurologically, and was discharged back to SNF.  Readmitted 4 days ago with fever and decreased responsiveness. Found to have C. difficile colitis, currently under treatment with vancomycin by [facility]. Followup CT showed some enlargement of chronic subdural hematoma. CT this morning shows no further enlargement, possibly some slight decrease in size. Patient remains neurologically stable. We'll continue to monitor. Will speak with the patient's parents when available.   Hosie Spangle, MD 01/13/2014, 7:46 AM

## 2014-01-13 NOTE — Progress Notes (Signed)
UR complete.  Eulalia Ellerman RN, MSN 

## 2014-01-13 NOTE — Clinical Social Work Note (Signed)
Once pt is medically ready the pt can return back to [facility] where he is a long-term care resident there. CSW will continue to follow and assist with discharge needs.   [place], MSW, [place]

## 2014-01-14 DIAGNOSIS — G8250 Quadriplegia, unspecified: Secondary | ICD-10-CM

## 2014-01-14 DIAGNOSIS — Z931 Gastrostomy status: Secondary | ICD-10-CM

## 2014-01-14 DIAGNOSIS — E2740 Unspecified adrenocortical insufficiency: Secondary | ICD-10-CM

## 2014-01-14 LAB — COMPREHENSIVE METABOLIC PANEL
ALBUMIN: 3 g/dL — AB (ref 3.5–5.2)
ALT: 30 U/L (ref 0–53)
AST: 57 U/L — AB (ref 0–37)
Alkaline Phosphatase: 143 U/L — ABNORMAL HIGH (ref 39–117)
Anion gap: 13 (ref 5–15)
BUN: 17 mg/dL (ref 6–23)
CO2: 26 meq/L (ref 19–32)
CREATININE: 0.52 mg/dL (ref 0.50–1.35)
Calcium: 8.9 mg/dL (ref 8.4–10.5)
Chloride: 102 mEq/L (ref 96–112)
GFR calc Af Amer: >90 mL/min (ref >90)
GFR calc non Af Amer: >90 mL/min (ref >90)
Glucose, Bld: 86 mg/dL (ref 70–99)
POTASSIUM: 3 meq/L — AB (ref 3.7–5.3)
SODIUM: 141 meq/L (ref 137–147)
Total Bilirubin: 0.3 mg/dL (ref 0.3–1.2)
Total Protein: 8.6 g/dL — ABNORMAL HIGH (ref 6.0–8.3)

## 2014-01-14 LAB — CBC
HCT: 26.2 % — ABNORMAL LOW (ref 39.0–52.0)
HEMOGLOBIN: 8.5 g/dL — AB (ref 13.0–17.0)
MCH: 23.8 pg — ABNORMAL LOW (ref 26.0–34.0)
MCHC: 32.4 g/dL (ref 30.0–36.0)
MCV: 73.4 fL — AB (ref 78.0–100.0)
Platelets: 403 10*3/uL — ABNORMAL HIGH (ref 150–400)
RBC: 3.57 MIL/uL — ABNORMAL LOW (ref 4.22–5.81)
RDW: 13.5 % (ref 11.5–15.5)
WBC: 10.1 10*3/uL (ref 4.0–10.5)

## 2014-01-14 LAB — PHENYTOIN LEVEL, TOTAL: PHENYTOIN LVL: 10.7 ug/mL (ref 10.0–20.0)

## 2014-01-14 MED ORDER — POTASSIUM CHLORIDE 10 MEQ/100ML IV SOLN
10.0000 meq | INTRAVENOUS | Status: AC
  Administered 2014-01-14 (×4): 10 meq via INTRAVENOUS
  Filled 2014-01-14 (×4): qty 100

## 2014-01-14 NOTE — Progress Notes (Addendum)
[place] TEAM 1 - Stepdown/ICU TEAM Progress Note  [name] ZOX:[id number] DOB: 10-07-1955 DOA: 01/09/2014 PCP: [name], MD  Admit HPI / Brief Narrative: 58 y.o. male with history of quadriplegia, seizures, and adrenal insufficiency who was admitted for subdural hematoma 10/08 - 10/12 who was brought to the ER because of fever. Patient's mother noticed that patient was becoming increasingly lethargic.  In the ER CT of the abdom noted inflammatory changes in the sigmoid and rectal area. CT of the head noted worsening of the known subdural hematoma with worsening midline shift and herniation.  Neurosurgery was consulted, and the pt was admitted to the ICU, w/ PCCM assuming his care.    Since admission, the pt has been found to have C diff colitis, w/ positive PCR and CT abdom noting colitis.  His SDH has not required intervention.    HPI/Subjective: Pt opens eyes to voice/touch, but is non communicative otherwise.    Assessment/Plan:  Severe sepsis due to C diff colitis  Cont enteral vanc via feeding tube - no clinical evidence of megacolon at this time  Labs showed white count of 10,100, he is afebrile, hemodynamically stable Continue vancomycin  Subacute L SDH w/ midline shift  NS following  Patient evaluated by neurosurgery, patient remaining stable and at his baseline. Recommended conservative management as the risk of surgical intervention may outweigh benefits Plan to monitor chronic subdural hematoma with serial CT scans  Chronic OBS - Quadriplegia - Chronic G tube PEG tube dependent Restarting tube feeds today, nutrition consulted  Hypokalemia Likely secondary to GI loss from diarrhea -Will provide 40 mEq of IV potassium, repeat labs in a.m.  Parkinson's disease  Sz d/o No evidence of active seizures   Chronic adrenal insuff / hypotension on fludrocortisone chronically, and currently on stress dose hydrocortisone - BP stable - taper steroid as  able   MRSA screen +  Code Status: FULL Family Communication: no family present at time of exam Disposition Plan: SDU  Consultants: PCCM > TRH NS  Procedures: none  Antibiotics: Cefepime 10/15 > 10/16 IV Vanc 10/15 > 10/17 Flagyl 10/15 > 10/17 Oral Vanc 10/16 >  DVT prophylaxis: SCDs  Objective: Blood pressure 125/59, pulse 78, temperature 98.6 F (37 C), temperature source Oral, resp. rate 20, height 6\' 2"  (1.88 m), weight 78.563 kg (173 lb 3.2 oz), SpO2 100.00%. No intake or output data in the 24 hours ending 01/14/14 1629 Exam: General: No acute respiratory distress Lungs: Clear to auscultation bilaterally without wheezes or crackles - poor air movement in B bases Cardiovascular: Regular rate and rhythm without murmur gallop or rub normal S1 and S2 Abdomen: Mildly distended, PEG insertion clean and dry, soft, bowel sounds positive, no rebound, no appreciable mass Extremities: No significant cyanosis, clubbing, or edema bilateral lower extremities  Data Reviewed: Basic Metabolic Panel:  Recent Labs Lab 01/09/14 1954 01/10/14 0542 01/11/14 0229 01/13/14 0330 01/14/14 0656  NA 132* 131* 132* 139 141  K 3.7 3.8 3.9 3.2* 3.0*  CL 94* 93* 96 100 102  CO2 24 24 21 26 26   GLUCOSE 93 94 113* 99 86  BUN 14 12 13 15 17   CREATININE 0.56 0.49* 0.46* 0.47* 0.52  CALCIUM 9.0 9.2 8.8 8.9 8.9  MG  --   --  2.1  --   --   PHOS  --   --  3.4  --   --     Liver Function Tests:  Recent Labs Lab 01/09/14 1954 01/10/14 0542  01/13/14 0330 01/14/14 0656  AST 23 22 36 57*  ALT 10 10 14 30   ALKPHOS 152* 151* 120* 143*  BILITOT 0.3 0.5 0.3 0.3  PROT 8.3 8.8* 8.2 8.6*  ALBUMIN 3.0* 3.2* 2.9* 3.0*   CBC:  Recent Labs Lab 01/09/14 1954 01/10/14 0542 01/11/14 0229 01/14/14 0656  WBC 8.3 11.5* 8.5 10.1  NEUTROABS  --  7.0  --   --   HGB 10.3* 9.2* 8.5* 8.5*  HCT 31.2* 27.8* 26.4* 26.2*  MCV 72.4* 72.2* 72.3* 73.4*  PLT 344 401* 353 403*    CBG:  Recent  Labs Lab 01/10/14 1604 01/10/14 1950 01/11/14 0002 01/11/14 0400 01/11/14 1147  GLUCAP 122* 129* 103* 105* 112*    Recent Results (from the past 240 hour(s))  CULTURE, BLOOD (ROUTINE X 2)     Status: None   Collection Time    01/09/14  7:49 PM      Result Value Ref Range Status   Specimen Description BLOOD ARM RIGHT   Final   Special Requests BOTTLES DRAWN AEROBIC AND ANAEROBIC 5CC   Final   Culture  Setup Time     Final   Value: 01/10/2014 01:07     Performed at [facility]   Culture     Final   Value:        BLOOD CULTURE RECEIVED NO GROWTH TO DATE CULTURE WILL BE HELD FOR 5 DAYS BEFORE ISSUING A FINAL NEGATIVE REPORT     Performed at [facility]   Report Status PENDING   Incomplete  CULTURE, BLOOD (ROUTINE X 2)     Status: None   Collection Time    01/09/14  8:12 PM      Result Value Ref Range Status   Specimen Description BLOOD HAND RIGHT   Final   Special Requests BOTTLES DRAWN AEROBIC AND ANAEROBIC 5CC   Final   Culture  Setup Time     Final   Value: 01/10/2014 01:08     Performed at [facility]   Culture     Final   Value:        BLOOD CULTURE RECEIVED NO GROWTH TO DATE CULTURE WILL BE HELD FOR 5 DAYS BEFORE ISSUING A FINAL NEGATIVE REPORT     Performed at [facility]   Report Status PENDING   Incomplete  URINE CULTURE     Status: None   Collection Time    01/09/14  9:37 PM      Result Value Ref Range Status   Specimen Description URINE, CATHETERIZED   Final   Special Requests ADDED [id number]   Final   Culture  Setup Time     Final   Value: 01/10/2014 08:53     Performed at [facility]   Colony Count     Final   Value: NO GROWTH     Performed at [facility]   Culture     Final   Value: NO GROWTH     Performed at [facility]   Report Status 01/11/2014 FINAL   Final  MRSA PCR SCREENING     Status: Abnormal   Collection Time    01/10/14  7:01 AM      Result Value Ref Range Status   MRSA by  PCR POSITIVE (*) NEGATIVE Final   Comment:            The GeneXpert MRSA Assay (FDA     approved for NASAL  specimens     only), is one component of a     comprehensive MRSA colonization     surveillance program. It is not     intended to diagnose MRSA     infection nor to guide or     monitor treatment for     MRSA infections.     CRITICAL RESULT CALLED TO, READ BACK BY AND VERIFIED WITH:     KSOR Q RN 01/10/14 0922 COSTELLO B  CLOSTRIDIUM DIFFICILE BY PCR     Status: Abnormal   Collection Time    01/10/14 10:00 AM      Result Value Ref Range Status   C difficile by pcr POSITIVE (*) NEGATIVE Final   Comment: CRITICAL RESULT CALLED TO, READ BACK BY AND VERIFIED WITH:     Saralyn Pilar RN 12:40 01/10/14 ([email or web address])     Studies:  Recent x-ray studies have been reviewed in detail by the Attending Physician  Scheduled Meds:  Scheduled Meds: . antiseptic oral rinse  7 mL Mouth Rinse q12n4p  . baclofen  10 mg Per Tube TID  . chlorhexidine  15 mL Mouth Rinse BID  . feeding supplement (PRO-STAT SUGAR FREE 64)  30 mL Per Tube TID  . ferrous sulfate  300 mg Per Tube q morning - 10a  . fludrocortisone  0.1 mg Per Tube QPM  . folic acid  1 mg Per Tube q morning - 10a  . free water  200 mL Per Tube 3 times per day  . mupirocin ointment  1 application Nasal BID  . olopatadine  1 drop Both Eyes q morning - 10a  . phenytoin  125 mg Per Tube BID  . polyvinyl alcohol  1 drop Both Eyes Daily  . predniSONE  10 mg Per Tube BID  . saccharomyces boulardii  250 mg Oral BID  . senna  2 tablet Per Tube Daily  . vancomycin  500 mg Per Tube 4 times per day    Time spent on care of this patient: 25 min  Jeralyn Bennett , MD   [facility] Office  [phone] Pager - Text Page per Loretha Stapler as per below:  On-Call/Text Page:      [email or web address]      password [email or web address]  If 7PM-7AM, please contact night-coverage [email or web address] Password [email or web address] 01/14/2014, 4:29 PM   LOS: 5 days

## 2014-01-14 NOTE — Progress Notes (Signed)
NUTRITION FOLLOW UP  Intervention:   Re-start tube feedings: Initiate Osmolite 1.5 @ 20 ml/hr via PEG and increase by 10 ml every 4 hours to goal rate of 55 ml/hr. Run for 20 hours daily. Turn off for one hour before and one hour after phenytoin dose.   30 ml Prostat TID.    Tube feeding regimen provides 1950 kcal (100% of needs), 114 grams of protein, and 838 ml of H2O.  Continue free water flushes, 200 ml every 8 hours, to provide additional 600 ml daily. Total free water daily: 1438 ml   Nutrition Dx:   Inadequate oral intake related to inability to eat as evidenced by NPO status; ongoing  Goal:   Pt to meet >/= 90% of their estimated nutrition needs; unmet  Monitor:   TF tolerance, weight trend, labs  Assessment:   58 y.o. male with history of quadriplegia, seizures, adrenal insufficiency, peg tube, Parkinson's Disease who was recently admitted for subdural hematoma was brought to the ER because of fever.  - TF regimen is TwoCal HN at 55 ml/hr for 20 hours. This provides 2200 kcal, and 92 gram protein  Pt not receiving TF at time of visit. Tube feeds were stopped the evening of 10/17 due to vomiting per RN. Per nursing notes patient has continued to receive Pro-stat and water thought PEG. RD received permission from MD to re-start tube feedings today.  Pt's weight has gone up 18 lbs in the past 2 weeks.  +0.6 L net fluid balance since admission Labs: low potassium, low hemoglobin  Height: Ht Readings from Last 1 Encounters:  01/10/14 6\' 2"  (1.88 m)    Weight Status:   Wt Readings from Last 1 Encounters:  01/14/14 173 lb 3.2 oz (78.563 kg)  01/02/14 155 lb 10/30/13 156 lb  Re-estimated needs:  Kcal: 1900-2100  Protein: 105-115 g  Fluid: 1.9-2.1 L/day   Skin: +1 generalized edema; stage II pressure ulcer on abdomen and deep tissue injury on buttocks  Diet Order:    No intake or output data in the 24 hours ending 01/14/14 1356  Last BM:  10/18   Labs:   Recent Labs Lab 01/11/14 0229 01/13/14 0330 01/14/14 0656  NA 132* 139 141  K 3.9 3.2* 3.0*  CL 96 100 102  CO2 21 26 26   BUN 13 15 17   CREATININE 0.46* 0.47* 0.52  CALCIUM 8.8 8.9 8.9  MG 2.1  --   --   PHOS 3.4  --   --   GLUCOSE 113* 99 86    CBG (last 3)  No results found for this basename: GLUCAP,  in the last 72 hours  Scheduled Meds: . antiseptic oral rinse  7 mL Mouth Rinse q12n4p  . baclofen  10 mg Per Tube TID  . chlorhexidine  15 mL Mouth Rinse BID  . feeding supplement (PRO-STAT SUGAR FREE 64)  30 mL Per Tube TID  . ferrous sulfate  300 mg Per Tube q morning - 10a  . fludrocortisone  0.1 mg Per Tube QPM  . folic acid  1 mg Per Tube q morning - 10a  . free water  200 mL Per Tube 3 times per day  . mupirocin ointment  1 application Nasal BID  . olopatadine  1 drop Both Eyes q morning - 10a  . phenytoin  125 mg Per Tube BID  . polyvinyl alcohol  1 drop Both Eyes Daily  . predniSONE  10 mg Per Tube BID  .  saccharomyces boulardii  250 mg Oral BID  . senna  2 tablet Per Tube Daily  . vancomycin  500 mg Per Tube 4 times per day    Continuous Infusions: . sodium chloride Stopped (01/12/14 0500)  . feeding supplement (OSMOLITE 1.5 CAL) Stopped (01/11/14 2000)    Pryor Ochoa RD, LDN Inpatient Clinical Dietitian Pager: [phone] After Hours Pager: [phone]

## 2014-01-14 NOTE — Progress Notes (Signed)
Subjective: Patient resting in bed, appears comfortable. Patient's father at bedside, and we had an opportunity discuss his condition and yesterday's CT of the brain. Currently has rectal tube, receiving free water via the a G-tube, but no nutrition including no tube feedings. Continues on vancomycin and oral solution via GT.  Objective: Vital signs in last 24 hours: Filed Vitals:   01/13/14 1906 01/13/14 2249 01/14/14 0210 01/14/14 0500  BP: 122/58 104/66 110/57 109/61  Pulse: 90 87 85 79  Temp: 99.2 F (37.3 C) 98.4 F (36.9 C) 98.5 F (36.9 C) 97.6 F (36.4 C)  TempSrc: Oral Oral Oral Oral  Resp: 20 22 20 20   Height:      Weight:    78.563 kg (173 lb 3.2 oz)  SpO2: 97% 100% 99% 100%    Intake/Output from previous day: 10/19 0701 - 10/20 0700 In: 60  Out: 1 [Stool:1] Intake/Output this shift:    Physical Exam:  Awake and alert, following commands with all 4 extremities. 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  BMET  Recent Labs  01/13/14 0330  NA 139  K 3.2*  CL 100  CO2 26  GLUCOSE 99  BUN 15  CREATININE 0.47*  CALCIUM 8.9    Assessment/Plan: Patient remains neurologically stable, and at baseline. Remains afebrile on vancomycin via GT.  Spoke with the patient's father and explained that it remains unclear that he would benefit from surgical intervention, and overall I feel the potential risks outweigh the potential benefits of surgical intervention at this time. We will plan to continue to follow this chronic subdural hematoma with serial CT scans, eventually as an outpatient.  Medical management including infection disease and nutritional management are deferred to the [facility] hospitalist service.   Hosie Spangle, MD 01/14/2014, 8:02 AM

## 2014-01-15 DIAGNOSIS — J9602 Acute respiratory failure with hypercapnia: Secondary | ICD-10-CM

## 2014-01-15 LAB — GLUCOSE, CAPILLARY
GLUCOSE-CAPILLARY: 75 mg/dL (ref 70–99)
Glucose-Capillary: 90 mg/dL (ref 70–99)

## 2014-01-15 LAB — BASIC METABOLIC PANEL
Anion gap: 14 (ref 5–15)
BUN: 16 mg/dL (ref 6–23)
CHLORIDE: 101 meq/L (ref 96–112)
CO2: 26 mEq/L (ref 19–32)
CREATININE: 0.48 mg/dL — AB (ref 0.50–1.35)
Calcium: 9 mg/dL (ref 8.4–10.5)
GFR calc Af Amer: >90 mL/min (ref >90)
GFR calc non Af Amer: >90 mL/min (ref >90)
GLUCOSE: 84 mg/dL (ref 70–99)
POTASSIUM: 3.2 meq/L — AB (ref 3.7–5.3)
Sodium: 141 mEq/L (ref 137–147)

## 2014-01-15 LAB — CBC
HCT: 26.5 % — ABNORMAL LOW (ref 39.0–52.0)
HEMOGLOBIN: 8.4 g/dL — AB (ref 13.0–17.0)
MCH: 23.9 pg — AB (ref 26.0–34.0)
MCHC: 31.7 g/dL (ref 30.0–36.0)
MCV: 75.5 fL — AB (ref 78.0–100.0)
Platelets: 401 10*3/uL — ABNORMAL HIGH (ref 150–400)
RBC: 3.51 MIL/uL — AB (ref 4.22–5.81)
RDW: 13.8 % (ref 11.5–15.5)
WBC: 10.9 10*3/uL — ABNORMAL HIGH (ref 4.0–10.5)

## 2014-01-15 MED ORDER — POTASSIUM CHLORIDE 10 MEQ/100ML IV SOLN: 10.0000 meq | INTRAVENOUS | Status: DC

## 2014-01-15 MED ORDER — POTASSIUM CHLORIDE CRYS ER 20 MEQ PO TBCR
40.0000 meq | EXTENDED_RELEASE_TABLET | Freq: Every day | ORAL | Status: DC
  Administered 2014-01-15 – 2014-01-17 (×3): 40 meq via ORAL
  Filled 2014-01-15 (×3): qty 2

## 2014-01-15 MED ORDER — POTASSIUM CHLORIDE 10 MEQ/100ML IV SOLN
10.0000 meq | INTRAVENOUS | Status: AC
  Administered 2014-01-15 (×3): 10 meq via INTRAVENOUS
  Filled 2014-01-15 (×3): qty 100

## 2014-01-15 NOTE — Progress Notes (Signed)
Subjective: Patient resting in bed, appears comfortable. White blood cell count mildly elevated today. Mild hypokalemia.  Objective: Vital signs in last 24 hours: Filed Vitals:   01/14/14 2300 01/15/14 0244 01/15/14 0500 01/15/14 0606  BP: 119/62 126/61  115/64  Pulse: 75 79  81  Temp: 98.2 F (36.8 C) 99 F (37.2 C)  97.5 F (36.4 C)  TempSrc: Oral Oral  Oral  Resp: 18 22  24   Height:      Weight:   79.7 kg (175 lb 11.3 oz)   SpO2: 98% 100%  100%    Intake/Output from previous day: 10/20 0701 - 10/21 0700 In: -  Out: 400 [Urine:400] Intake/Output this shift:    Physical Exam:  Awake and alert, following commands with all 4 extremities. Spastic quadriparesis.  CBC  Recent Labs  01/14/14 0656 01/15/14 0440  WBC 10.1 10.9*  HGB 8.5* 8.4*  HCT 26.2* 26.5*  PLT 403* 401*   BMET  Recent Labs  01/14/14 0656 01/15/14 0440  NA 141 141  K 3.0* 3.2*  CL 102 101  CO2 26 26  GLUCOSE 86 84  BUN 17 16  CREATININE 0.52 0.48*  CALCIUM 8.9 9.0    Assessment/Plan: Neurologically stable. Started on tube feedings, continues on vancomycin per G-tube. We'll plan on followup in about a month to my office with a CT of the brain without contrast the week of the appointment.   Hosie Spangle, MD 01/15/2014, 10:43 AM

## 2014-01-15 NOTE — Progress Notes (Signed)
Patient ID: [name]  male  WUJ:[id number]    DOB: Mar 11, 1956    DOA: 01/09/2014  PCP: [name], MD  Admit HPI / Brief Narrative:  58 y.o. male with history of quadriplegia, seizures, and adrenal insufficiency who was admitted for subdural hematoma 10/08 - 10/12 who was brought to the ER because of fever. Patient's mother noticed that patient was becoming increasingly lethargic.  In the ER CT of the abdom noted inflammatory changes in the sigmoid and rectal area. CT of the head noted worsening of the known subdural hematoma with worsening midline shift and herniation. Neurosurgery was consulted, and the pt was admitted to the ICU, w/ PCCM assuming his care.  Since admission, the pt has been found to have C diff colitis, w/ positive PCR and CT abdom noting colitis. His SDH has not required intervention.      Assessment/Plan: Principal Problem: Severe sepsis due to C diff colitis  Cont enteral vanc via feeding tube - no clinical evidence of megacolon at this time, currently hemodynamically stable, afebrile  Continue vancomycin, still has diarrhea, rectal tube  Subacute L SDH w/ midline shift  Patient evaluated by neurosurgery, patient remaining stable and at his baseline. Recommended conservative management as the risk of surgical intervention may outweigh benefits  Plan to monitor chronic subdural hematoma with serial CT scans, outpatient per neurosurgery , discussed with Dr. Newell Coral  Chronic OBS - Quadriplegia - Chronic G tube  PEG tube dependent, tolerating tube feeds   Hypokalemia  Likely secondary to GI loss from diarrhea , replaced  Parkinson's disease  Sz d/o  No evidence of active seizures   Chronic adrenal insuff / hypotension  on fludrocortisone chronically, and currently on stress dose hydrocortisone - BP stable - taper steroid as able   DVT Prophylaxis:  Code Status: Full code  Family Communication: Discussed in detail with patient's father at  the bedside  Disposition:  Consultants:  Neurosurgery  Procedures:  None  Antibiotics:  Oral vancomycin    Subjective: No acute complaints or issues overnight, alert and awake, not communicative, father at the bedside  Objective: Weight change: 1.137 kg (2 lb 8.1 oz)  Intake/Output Summary (Last 24 hours) at 01/15/14 1245 Last data filed at 01/14/14 1725  Gross per 24 hour  Intake      0 ml  Output    400 ml  Net   -400 ml   Blood pressure 122/63, pulse 80, temperature 98.6 F (37 C), temperature source Oral, resp. rate 20, height 6\' 2"  (1.88 m), weight 79.7 kg (175 lb 11.3 oz), SpO2 100.00%.  Physical Exam: General: Alert and awake,  not in any acute distress. CVS: S1-S2 clear, no murmur rubs or gallops Chest: clear to auscultation bilaterally, no wheezing, rales or rhonchi Abdomen: PEG tube, soft normal bowel sounds  Extremities: no cyanosis, clubbing or edema noted bilaterally   Lab Results: Basic Metabolic Panel:  Recent Labs Lab 01/11/14 0229  01/14/14 0656 01/15/14 0440  NA 132*  < > 141 141  K 3.9  < > 3.0* 3.2*  CL 96  < > 102 101  CO2 21  < > 26 26  GLUCOSE 113*  < > 86 84  BUN 13  < > 17 16  CREATININE 0.46*  < > 0.52 0.48*  CALCIUM 8.8  < > 8.9 9.0  MG 2.1  --   --   --   PHOS 3.4  --   --   --   < > =  values in this interval not displayed. Liver Function Tests:  Recent Labs Lab 01/13/14 0330 01/14/14 0656  AST 36 57*  ALT 14 30  ALKPHOS 120* 143*  BILITOT 0.3 0.3  PROT 8.2 8.6*  ALBUMIN 2.9* 3.0*   No results found for this basename: LIPASE, AMYLASE,  in the last 168 hours No results found for this basename: AMMONIA,  in the last 168 hours CBC:  Recent Labs Lab 01/10/14 0542  01/14/14 0656 01/15/14 0440  WBC 11.5*  < > 10.1 10.9*  NEUTROABS 7.0  --   --   --   HGB 9.2*  < > 8.5* 8.4*  HCT 27.8*  < > 26.2* 26.5*  MCV 72.2*  < > 73.4* 75.5*  PLT 401*  < > 403* 401*  < > = values in this interval not displayed. Cardiac  Enzymes: No results found for this basename: CKTOTAL, CKMB, CKMBINDEX, TROPONINI,  in the last 168 hours BNP: No components found with this basename: POCBNP,  CBG:  Recent Labs Lab 01/10/14 1950 01/11/14 0002 01/11/14 0400 01/11/14 1147 01/15/14 1159  GLUCAP 129* 103* 105* 112* 90     Micro Results: Recent Results (from the past 240 hour(s))  CULTURE, BLOOD (ROUTINE X 2)     Status: None   Collection Time    01/09/14  7:49 PM      Result Value Ref Range Status   Specimen Description BLOOD ARM RIGHT   Final   Special Requests BOTTLES DRAWN AEROBIC AND ANAEROBIC 5CC   Final   Culture  Setup Time     Final   Value: 01/10/2014 01:07     Performed at [facility]   Culture     Final   Value:        BLOOD CULTURE RECEIVED NO GROWTH TO DATE CULTURE WILL BE HELD FOR 5 DAYS BEFORE ISSUING A FINAL NEGATIVE REPORT     Performed at [facility]   Report Status PENDING   Incomplete  CULTURE, BLOOD (ROUTINE X 2)     Status: None   Collection Time    01/09/14  8:12 PM      Result Value Ref Range Status   Specimen Description BLOOD HAND RIGHT   Final   Special Requests BOTTLES DRAWN AEROBIC AND ANAEROBIC 5CC   Final   Culture  Setup Time     Final   Value: 01/10/2014 01:08     Performed at [facility]   Culture     Final   Value:        BLOOD CULTURE RECEIVED NO GROWTH TO DATE CULTURE WILL BE HELD FOR 5 DAYS BEFORE ISSUING A FINAL NEGATIVE REPORT     Performed at [facility]   Report Status PENDING   Incomplete  URINE CULTURE     Status: None   Collection Time    01/09/14  9:37 PM      Result Value Ref Range Status   Specimen Description URINE, CATHETERIZED   Final   Special Requests ADDED [id number]   Final   Culture  Setup Time     Final   Value: 01/10/2014 08:53     Performed at [facility]   Colony Count     Final   Value: NO GROWTH     Performed at [facility]   Culture     Final   Value: NO GROWTH     Performed at  [facility]  Report Status 01/11/2014 FINAL   Final  MRSA PCR SCREENING     Status: Abnormal   Collection Time    01/10/14  7:01 AM      Result Value Ref Range Status   MRSA by PCR POSITIVE (*) NEGATIVE Final   Comment:            The GeneXpert MRSA Assay (FDA     approved for NASAL specimens     only), is one component of a     comprehensive MRSA colonization     surveillance program. It is not     intended to diagnose MRSA     infection nor to guide or     monitor treatment for     MRSA infections.     CRITICAL RESULT CALLED TO, READ BACK BY AND VERIFIED WITH:     KSOR Q RN 01/10/14 0922 COSTELLO B  CLOSTRIDIUM DIFFICILE BY PCR     Status: Abnormal   Collection Time    01/10/14 10:00 AM      Result Value Ref Range Status   C difficile by pcr POSITIVE (*) NEGATIVE Final   Comment: CRITICAL RESULT CALLED TO, READ BACK BY AND VERIFIED WITH:     Saralyn Pilar RN 12:40 01/10/14 ([email or web address])    Studies/Results: Dg Chest 2 View  01/09/2014   CLINICAL DATA:  Fever. Recent diagnosis of urinary tract infection. Pain.  EXAM: CHEST  2 VIEW  COMPARISON:  01/05/2014  FINDINGS: No cardiomegaly. Unchanged aortic contours, distorted by rightward rotation.  There is chronic diffuse interstitial coarsening without pneumonia or overt edema. No effusion or pneumothorax. Stable biapical extrapleural thickening. 23 mm long triangular density overlapping the left mid chest is not definitively seen laterally and is presumably external to the patient.  IMPRESSION: Diffuse interstitial coarsening which could be congestive or bronchitic.   Electronically Signed   By: Tiburcio Pea M.D.   On: 01/09/2014 21:15   Ct Head Wo Contrast  01/13/2014   CLINICAL DATA:  Follow-up LEFT subdural hematoma. History of seizure and adrenal insufficiency.  EXAM: CT HEAD WITHOUT CONTRAST  TECHNIQUE: Contiguous axial images were obtained from the base of the skull through the vertex without intravenous contrast.   COMPARISON:  CT of the head January 09, 2014  FINDINGS: Similar to slightly decreased heterogeneous LEFT holohemispheric subdural hematoma. 12 mm LEFT-to-RIGHT midline shift with partial effacement LEFT lateral ventricle. No hydrocephalus or CT findings of entrapment. No rebleed. No intraparenchymal hemorrhage. No acute large vascular territory infarct. Small remote bilateral basal ganglia lacunar infarcts. Moderate generalized global parenchymal brain volume loss for age.  Prominent LEFT posterior fossa extra-axial fluid collection may reflect remote subdural hematoma, unchanged. Basal cisterns are patent.  Mild paranasal sinus mucosal thickening without air-fluid levels. Mastoid air cells are well aerated. No skull fracture.  IMPRESSION: Similar to slightly decreased size of LEFT hole atmospheric acute on chronic subdural hematoma cauda 12 mm LEFT-to-RIGHT midline shift, decreased. No CT findings is hydrocephalus/entrapment.  Remote bilateral basal ganglia lacunar infarcts.  Global parenchymal brain volume loss, advanced for age.   Electronically Signed   By: Awilda Metro   On: 01/13/2014 05:16   Ct Head Wo Contrast  01/10/2014   CLINICAL DATA:  History of nontraumatic subdural hematoma. Increasing pain. Initial encounter.  EXAM: CT HEAD WITHOUT CONTRAST  TECHNIQUE: Contiguous axial images were obtained from the base of the skull through the vertex without intravenous contrast.  COMPARISON:  01/06/2014  FINDINGS: Skull and Sinuses:Negative for fracture  or destructive process. The mastoids, middle ears, and imaged paranasal sinuses are clear.  Orbits: No acute abnormality.  Brain: There is a mixed density subdural collection around the left cerebral convexity which is increased from before. The collection is predominantly isodense to hypodense, compatible with proteinaceous fluid. There are also thin high-density areas that could represent recent hemorrhage or septations. The scalloped appearance of the  collection superiorly is also consistent with loculation. Rightward midline shift is increased, currently 15 mm previously 10 mm. There is marked subfalcine herniation and early left uncal herniation which was also present previously. No evidence of ACA infarct or ventricular entrapment.  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 The brain is globally atrophic. No evidence of acute infarct or hydrocephalus.  Critical Value/emergent results were called by telephone at the time of interpretation on 01/10/2014 at 12:17 am to Dr. Denton Lank, who verbally acknowledged these results.  IMPRESSION: 1. Increased mixed density subdural hematoma around the left cerebral convexity. Midline shift has increased to 15 mm, with associated herniations described above. 2. Atrophy and chronic putaminal necrosis, as above.   Electronically Signed   By: Tiburcio Pea M.D.   On: 01/10/2014 00:24   Ct Head Wo Contrast  01/06/2014   CLINICAL DATA:  58 year old male with left subdural hematoma. Initial encounter.  EXAM: CT HEAD WITHOUT CONTRAST  TECHNIQUE: Contiguous axial images were obtained from the base of the skull through the vertex without intravenous contrast.  COMPARISON:  01/03/2014 and earlier.  FINDINGS: No acute osseous abnormality identified. No acute orbit or scalp soft tissue findings. Stable minor paranasal sinus mucosal thickening. Calcified atherosclerosis at the skull base.  Mixed density left subdural hematoma persists measuring up to 12-13 mm in thickness at the level of the operculum, and has not significantly changed since 01/02/2014.  Suggestion of a much smaller right vertex subdural hematoma measuring up to 5 mm (series 2, image 22). This was not evident on earlier exams, but is low density.  Rightward midline shift of 10 mm is stable. Stable ventricles. Basilar cisterns remain  patent. No intra-axial or intraventricular hemorrhage. Stable gray-white matter differentiation throughout the brain. Age advanced cerebral volume loss. Age advanced bilateral external capsule hypodensity. No evidence of cortically based acute infarction identified.  IMPRESSION: 1. Mixed density left subdural hematoma is stable since 01/02/2014, measuring up to 12 to 13 mm thickness at the level of the operculum. 2. Questionable small or trace right side subdural at the vertex, 5 mm. 3. Mass effect is stable, with rightward midline shift of 10 mm.   Electronically Signed   By: Augusto Gamble M.D.   On: 01/06/2014 11:08   Ct Head Wo Contrast  01/03/2014   CLINICAL DATA:  Followup subdural hematoma.  EXAM: CT HEAD WITHOUT CONTRAST  TECHNIQUE: Contiguous axial images were obtained from the base of the skull through the vertex without intravenous contrast.  COMPARISON:  Prior CT from 01/02/2014  FINDINGS: Previously identified acute on chronic left subdural hematoma again seen. This collection is not significantly changed in size measuring up to 1.3 cm in transverse diameter at the level of the left frontal lobe. Again, this measures 3 cm at the vertex. The acute blood components are slightly different in location, but overall likely not significantly changed in volume with no definite new bleeding. Left-to-right midline shift at the level of the septum pellucidum measures approximately 1.1 cm, likely not significantly changed. Ventricle  size is stable without hydrocephalus.  Parenchymal volume loss with small vessel disease again noted. Remote ischemic infarcts within the bilateral external capsules again noted. No acute large vessel territory infarct.  Calvarium and scalp soft tissues within normal limits. No acute abnormality seen about either orbit. Paranasal sinuses and mastoid air cells are clear.  IMPRESSION: 1. Stable size of acute on chronic left subdural hematoma measuring up to 1.3 cm in thickness with similar  left-to-right midline shift of 1.1 cm. 2. No new intracranial abnormality.   Electronically Signed   By: Rise Mu M.D.   On: 01/03/2014 07:28   Ct Head Wo Contrast  01/02/2014   CLINICAL DATA:  Acute onset of fever. Acute encephalopathy. Personal history of seizures.  EXAM: CT HEAD WITHOUT CONTRAST  TECHNIQUE: Contiguous axial images were obtained from the base of the skull through the vertex without intravenous contrast.  COMPARISON:  CT of the head performed 04/27/2004  FINDINGS: There is a large left-sided acute on chronic subdural hematoma. This measures approximately 1.3 cm in thickness at the level of the lateral ventricles. At the vertex, it measures up to 3 cm, though this reflects the plane of imaging. Scattered foci of acute blood are noted within the hematoma. There is marked associated mass effect, with approximately 1.4 cm of rightward midline shift.  Underlying moderate cortical volume loss is noted. Cerebellar atrophy is seen. Scattered periventricular white matter change likely reflects small vessel ischemic microangiopathy. Chronic ischemic change is noted with regard to the external capsule bilaterally.  The brainstem and fourth ventricle are within normal limits.  There is no evidence of fracture; visualized osseous structures are unremarkable in appearance. The visualized portions of the orbits are within normal limits. The paranasal sinuses and mastoid air cells are well-aerated. No significant soft tissue abnormalities are seen.  IMPRESSION: 1. Large left-sided acute on chronic subdural hematoma, measuring 1.3 cm in thickness at the level of the lateral ventricles. It measures up to 3 cm at the vertex, though this reflects the plane of imaging. Scattered foci of acute blood noted within the hematoma. Marked associated mass effect, with 1.4 cm of rightward midline shift. 2. Moderate cortical volume loss and scattered small vessel ischemic microangiopathy. Chronic ischemic change  noted with regard to the external capsule bilaterally.  Critical Value/emergent results were called by telephone at the time of interpretation on 01/02/2014 at 3:57 am to Dr. Midge Minium, who verbally acknowledged these results.   Electronically Signed   By: Roanna Raider M.D.   On: 01/02/2014 04:00   Ct Lumbar Spine Wo Contrast  01/02/2014   CLINICAL DATA:  Back pain.  No injury.  History of quadriplegia.  EXAM: CT LUMBAR SPINE WITHOUT CONTRAST  TECHNIQUE: Multidetector CT imaging of the lumbar spine was performed without intravenous contrast administration. Multiplanar CT image reconstructions were also generated.  COMPARISON:  Prior CT from 06/22/2013  FINDINGS: Vertebral bodies are normally aligned with preservation of the normal lumbar lordosis. Osteopenia noted.  Chronic anterior height loss at the superior endplate of T12 with associated degenerative endplate Schmorl's node is stable from prior study. Minimal height loss at T11 also unchanged. There is increased central height loss at the superior endplate of L5 as compared to prior CT from 06/22/2013. No discrete fracture line is seen. Otherwise, vertebral body heights are preserved.  Vertebral bodies are normally aligned with preservation of the normal lumbar lordosis.  The conus medullaris terminates at a normal level, and is grossly unremarkable.  Fatty atrophy  noted within the paraspinous musculature. No acute soft tissue abnormality.  At T10-11 through L1-2, there is no disc bulge or disc protrusion. No significant canal or foraminal stenosis.  L2-3: There is mild disc bulge with bilateral facet hypertrophy. No focal disc protrusion. No canal or foraminal stenosis.  L3-4: There is mild diffuse disc bulge with bilateral facet hypertrophy and ligamentum flavum thickening. No significant canal or foraminal stenosis. No focal disc protrusion.  L4-5: There is diffuse disc bulging. There is a superimposed right foraminal disc protrusion without  definite nerve root impingement (series 3, image 109). Bilateral facet hypertrophy with ligamentum flavum thickening present. There is resultant mild canal stenosis. Mild bilateral foraminal narrowing.  L5-S1 mild bilateral facet hypertrophy with mild diffuse disc bulge. No canal or foraminal stenosis.  IMPRESSION: 1. Increased central height loss at the superior endplate of L5 as compared to most recent CT from 11/13/2013. No definite discrete fracture line identified. Correlation with MRI could be performed if there is clinical concern for possible occult compression fracture at this level. 2. Stable anterior wedging of the T11 and T12 vertebral bodies. 3. Degenerative disc bulge with superimposed right foraminal disc protrusion at L4-5 with resultant mild canal bilateral foraminal stenosis. 4. Additional mild degenerative changes as above.   Electronically Signed   By: Rise Mu M.D.   On: 01/02/2014 04:24   Ct Abdomen Pelvis W Contrast  01/10/2014   CLINICAL DATA:  Abdominal tenderness, pain and fever.  EXAM: CT ABDOMEN AND PELVIS WITH CONTRAST  TECHNIQUE: Multidetector CT imaging of the abdomen and pelvis was performed using the standard protocol following bolus administration of intravenous contrast.  CONTRAST:  OMNIPAQUE IOHEXOL 300 MG/ML  SOLN  COMPARISON:  CT abdomen pelvis - 06/15/2012; lumbar spine CT -01/02/2014  FINDINGS: Normal hepatic contour. No discrete hepatic lesions. Normal appearance of the gallbladder. No radiopaque gallstones. No intra extrahepatic biliary duct dilatation. No ascites.  There is symmetric enhancement and excretion of the bilateral kidneys. Note is made of an approximately 2.5 cm hypo attenuating (-8 Hounsfield unit) partially exophytic left-sided renal cyst. No discrete right-sided renal lesions. No definite renal stones on this postcontrast examination. No urinary obstruction or perinephric stranding.  Normal appearance of the bilateral adrenal glands,  pancreas and spleen.  Enteric contrast extends to the level of the proximal ascending colon. A gastrostomy tube balloon is appropriately insufflated within the mid body of the stomach. There is diffuse wall thickening involving the distal sigmoid colon (image 72, series 21), extending to the level of the rectum. The sigmoid colon is noted to be redundant but otherwise normal in appearance. Normal appearance of the appendix. No pneumoperitoneum, pneumatosis or portal venous gas.  Scattered mixed calcified and noncalcified atherosclerotic plaque with a normal caliber abdominal aorta. The major branch vessels of the abdominal aorta appear widely patent on this non CTA examination though exuberant calcification is noted involving the origin of the solitary right renal artery.  No retroperitoneal, mesenteric pelvic or inguinal lymphadenopathy. No retroperitoneal, mesenteric  Limited visualization of the lower thorax demonstrates minimal dependent ground-glass atelectasis. No discrete focal airspace opacities. No pleural effusion.  Normal heart size. No pericardial effusion. Coronary artery calcifications.  No acute or aggressive osseus abnormalities. Grossly unchanged moderate (approximately 30%) compression deformity involving the L5 vertebral body, and mild (under 25%) compression deformity involving the superior endplates of the T11 and T12 vertebral bodies.  IMPRESSION: 1. Diffuse wall thickening involving the distal sigmoid colon extending through the level of the rectum,  the etiology of which is not depicted as examination with differential considerations including infectious, inflammatory and (less likely) ischemic etiologies. These findings are associated with adjacent mesenteric stranding but without evidence of perforation or definable/drainable fluid collection. No enteric obstruction. 2. Appropriately positioned gastrostomy tube. 3. Grossly unchanged moderate compression deformity involving the L5 vertebral  body and mild compression deformities involving the T11 and T12 vertebral bodies. Correlation with report from lumbar spine CT performed 01/02/2014 is recommended.   Electronically Signed   By: Simonne Come M.D.   On: 01/10/2014 00:20   Dg Chest Port 1 View  01/10/2014   CLINICAL DATA:  Respiratory failure with hypoxia.  EXAM: PORTABLE CHEST - 1 VIEW  COMPARISON:  January 09, 2014.  FINDINGS: Stable cardiomediastinal silhouette. No pneumothorax is noted. Stable chronic diffuse interstitial coarsening is noted. Mild left basilar opacity is noted concerning for subsegmental atelectasis or pneumonia. Possible mild associated pleural effusion may be present. Bony thorax appears intact.  IMPRESSION: Mild left basilar opacity is noted concerning for pneumonia or subsegmental atelectasis, with possible associated pleural effusion. Follow-up radiographs are recommended.   Electronically Signed   By: Roque Lias M.D.   On: 01/10/2014 13:20   Dg Chest Port 1 View  01/05/2014   CLINICAL DATA:  Persistent cough  EXAM: PORTABLE CHEST - 1 VIEW  COMPARISON:  January 02, 2014 and June 20, 2013  FINDINGS: There is diffuse interstitial prominence without airspace consolidation. Heart is upper normal in size with pulmonary vascularity within normal limits. No adenopathy. No bone lesions.  IMPRESSION: Persistent diffuse interstitial prominence. Suspect a degree of chronic congestive heart failure. No consolidation. Apparent stable compared to recent prior study.   Electronically Signed   By: Bretta Bang M.D.   On: 01/05/2014 09:48   Dg Chest Port 1 View  01/02/2014   CLINICAL DATA:  Fever beginning yesterday. Shortness of breath and wheezing. Initial encounter.  EXAM: PORTABLE CHEST - 1 VIEW  COMPARISON:  One-view chest 06/20/2013.  FINDINGS: The patient is rotated to the right. The heart size is upper limits of normal. This is exaggerated by low lung volumes. Right peritracheal soft tissue prominence is likely  vascular and projectional. Chronic interstitial coarsening is evident. The right hemidiaphragm is further elevated with asymmetric subsegmental atelectasis at the right base.  IMPRESSION: 1. Low lung volumes with new elevation of the right hemidiaphragm. 2. Bibasilar subsegmental atelectasis, right greater than left. 3. Chronic interstitial coarsening.   Electronically Signed   By: Gennette Pac M.D.   On: 01/02/2014 00:36   Dg Abd Portable 1v  01/05/2014   CLINICAL DATA:  Fecal impaction in rectum  EXAM: PORTABLE ABDOMEN - 1 VIEW  COMPARISON:  01/04/2014  FINDINGS: A gastric feeding tube again noted. There is nonspecific nonobstructive bowel gas pattern. There is some residual stool within rectum with improvement from prior exam. Rectum measures 5 cm in diameter. Persistent some gaseous distension of sigmoid colon.  IMPRESSION: Nonobstructive small bowel bowel gas pattern. Residual stool within rectum with improvement from prior exam. Rectum measures about 5 cm in diameter. Persistent mild gaseous distension of sigmoid colon.   Electronically Signed   By: Natasha Mead M.D.   On: 01/05/2014 10:31   Dg Abd Portable 1v  01/04/2014   CLINICAL DATA:  Initial encounter for abdominal distention.  EXAM: PORTABLE ABDOMEN - 1 VIEW  COMPARISON:  One-view abdomen 01/02/2014.  FINDINGS: A gastrostomy tube is stable in appearance. Gas is present within the stomach. A distended loop  of bowel is noted in the left lower quadrant. A moderate amount of stool is again seen within the rectum. The bowel gas pattern is otherwise unremarkable. The visualized soft tissues and bony thorax are unremarkable.  IMPRESSION: 1. Distended loop of bowel in the left lower quadrant appears to be colon. This may represent a partial obstruction. Moderate stool is present in the rectum. This could be secondary to impaction. 2. The more proximal bowel is unremarkable. 3. Stable positioning of the gastrostomy tube.   Electronically Signed   By:  Gennette Pac M.D.   On: 01/04/2014 09:23   Dg Abd Portable 1v  01/02/2014   CLINICAL DATA:  Gastrostomy tube.  Initial evaluation.  EXAM: PORTABLE ABDOMEN - 1 VIEW  COMPARISON:  CT 11/13/2013.  FINDINGS: What appears to be a gastrostomy tube is noted projected over left upper quadrant. Stomach is not distended. No significant bowel distention. Stool in rectum. Calcified pelvic densities consistent with phleboliths.  IMPRESSION: What appears to be a gastrostomy tube is noted projected over left upper quadrant over the stomach. No gastric distention.   Electronically Signed   By: Maisie Fus  Register   On: 01/02/2014 10:44    Medications: Scheduled Meds: . antiseptic oral rinse  7 mL Mouth Rinse q12n4p  . baclofen  10 mg Per Tube TID  . chlorhexidine  15 mL Mouth Rinse BID  . feeding supplement (PRO-STAT SUGAR FREE 64)  30 mL Per Tube TID  . ferrous sulfate  300 mg Per Tube q morning - 10a  . fludrocortisone  0.1 mg Per Tube QPM  . folic acid  1 mg Per Tube q morning - 10a  . free water  200 mL Per Tube 3 times per day  . olopatadine  1 drop Both Eyes q morning - 10a  . phenytoin  125 mg Per Tube BID  . polyvinyl alcohol  1 drop Both Eyes Daily  . potassium chloride  10 mEq Intravenous Q1 Hr x 3  . potassium chloride  40 mEq Oral Daily  . predniSONE  10 mg Per Tube BID  . saccharomyces boulardii  250 mg Oral BID  . senna  2 tablet Per Tube Daily  . vancomycin  500 mg Per Tube 4 times per day      LOS: 6 days   Sidra Oldfield M.D. [facility] 01/15/2014, 12:45 PM Pager: [phone]  If 7PM-7AM, please contact night-coverage [email or web address] Password [email or web address]

## 2014-01-16 LAB — GLUCOSE, CAPILLARY
GLUCOSE-CAPILLARY: 104 mg/dL — AB (ref 70–99)
GLUCOSE-CAPILLARY: 124 mg/dL — AB (ref 70–99)
Glucose-Capillary: 115 mg/dL — ABNORMAL HIGH (ref 70–99)

## 2014-01-16 LAB — CULTURE, BLOOD (ROUTINE X 2)
CULTURE: NO GROWTH
Culture: NO GROWTH

## 2014-01-16 MED ORDER — METRONIDAZOLE IN NACL 5-0.79 MG/ML-% IV SOLN
500.0000 mg | Freq: Three times a day (TID) | INTRAVENOUS | Status: DC
  Administered 2014-01-16 – 2014-01-17 (×4): 500 mg via INTRAVENOUS
  Filled 2014-01-16 (×4): qty 100

## 2014-01-16 MED ORDER — CHOLESTYRAMINE LIGHT 4 G PO PACK
4.0000 g | PACK | Freq: Once | ORAL | Status: AC
  Administered 2014-01-16: 4 g via ORAL
  Filled 2014-01-16: qty 1

## 2014-01-16 MED ORDER — FREE WATER
200.0000 mL | Freq: Four times a day (QID) | Status: DC
  Administered 2014-01-16 – 2014-01-17 (×3): 200 mL

## 2014-01-16 NOTE — Progress Notes (Signed)
Subjective: Patient resting in bed, appears comfortable. Denies discomfort. Patient's father at bedside.  Objective: Vital signs in last 24 hours: Filed Vitals:   01/15/14 2249 01/16/14 0123 01/16/14 0142 01/16/14 0500  BP: 111/58  114/58   Pulse: 90  93   Temp: 100.2 F (37.9 C) 99.5 F (37.5 C) 98.7 F (37.1 C)   TempSrc: Oral Oral Oral   Resp: 20  24   Height:      Weight:    77.202 kg (170 lb 3.2 oz)  SpO2: 99%  98%     Intake/Output from previous day: 10/21 0701 - 10/22 0700 In: 530 [NG/GT:530] Out: 925 [Urine:725; Stool:200] Intake/Output this shift:    Physical Exam:  Awake alert, follows commands with all 4 extremities. Spastic quadriparesis.  CBC  Recent Labs  01/14/14 0656 01/15/14 0440  WBC 10.1 10.9*  HGB 8.5* 8.4*  HCT 26.2* 26.5*  PLT 403* 401*   BMET  Recent Labs  01/14/14 0656 01/15/14 0440  NA 141 141  K 3.0* 3.2*  CL 102 101  CO2 26 26  GLUCOSE 86 84  BUN 17 16  CREATININE 0.52 0.48*  CALCIUM 8.9 9.0    Assessment/Plan: Patient remains neurologically stable. Continues on tube feedings. Continues on vancomycin per G-tube. Spoke with the patient's father about his condition and explained that I will be out until Monday, October 28, and if the patient is still in the hospital, I will return to see him then. If he is discharged, I will need to see him back in my office in about a month, with a CT scan of the brain without contrast, the week of the appointment.   Hosie Spangle, MD 01/16/2014, 7:56 AM

## 2014-01-16 NOTE — Progress Notes (Signed)
UR complete.  Tynlee Bayle RN, MSN 

## 2014-01-16 NOTE — Progress Notes (Signed)
Patient ID: [name]  male  VHQ:[id number]    DOB: 1955-06-02    DOA: 01/09/2014  PCP: [name], MD  Admit HPI / Brief Narrative:  58 y.o. male with history of quadriplegia, seizures, and adrenal insufficiency who was admitted for subdural hematoma 10/08 - 10/12 who was brought to the ER because of fever. Patient's mother noticed that patient was becoming increasingly lethargic.  In the ER CT of the abdom noted inflammatory changes in the sigmoid and rectal area. CT of the head noted worsening of the known subdural hematoma with worsening midline shift and herniation. Neurosurgery was consulted, and the pt was admitted to the ICU, w/ PCCM assuming his care.  Since admission, the pt has been found to have C diff colitis, w/ positive PCR and CT abdom noting colitis. His SDH has not required intervention.      Assessment/Plan: Principal Problem: Severe sepsis due to C diff colitis : Still very ongoing Cont enteral vanc via feeding tube - no clinical evidence of megacolon at this time, currently hemodynamically stable, afebrile  Continue vancomycin, added IV Flagyl Continue rectal tube, still has diarrhea, one dose of cholestyramine  Subacute L SDH w/ midline shift  Patient evaluated by neurosurgery, patient remaining stable and at his baseline. Recommended conservative management as the risk of surgical intervention may outweigh benefits  Plan to monitor chronic subdural hematoma with serial CT scans, outpatient per neurosurgery , discussed with Dr. Newell Coral  Chronic OBS - Quadriplegia - Chronic G tube  PEG tube dependent, tolerating tube feeds   Hypokalemia  Likely secondary to GI loss from diarrhea , replaced  Parkinson's disease  Sz d/o  No evidence of active seizures   Chronic adrenal insuff / hypotension  on fludrocortisone chronically, and currently on stress dose hydrocortisone - BP stable - taper steroid as able   DVT Prophylaxis:  Code Status: Full  code  Family Communication:  No family member at bedside  Disposition: Once diarrhea improves  Consultants:  Neurosurgery  Procedures:  None  Antibiotics:  Oral vancomycin  IV vancomycin  Subjective: No acute complaints or issues overnight, alert and awake, not communicative  Objective: Weight change: -2.498 kg (-5 lb 8.1 oz)  Intake/Output Summary (Last 24 hours) at 01/16/14 1307 Last data filed at 01/15/14 1530  Gross per 24 hour  Intake    530 ml  Output    925 ml  Net   -395 ml   Blood pressure 112/62, pulse 92, temperature 97.8 F (36.6 C), temperature source Oral, resp. rate 22, height 6\' 2"  (1.88 m), weight 77.202 kg (170 lb 3.2 oz), SpO2 98.00%.  Physical Exam: General: Alert and awake  CVS: S1-S2 clear, no murmur rubs or gallops Chest: clear to auscultation bilaterally Abdomen: PEG tube, soft normal bowel sounds  Extremities: no cyanosis, clubbing or edema noted bilaterally GU: Rectal tube, condom cath  Lab Results: Basic Metabolic Panel:  Recent Labs Lab 01/11/14 0229  01/14/14 0656 01/15/14 0440  NA 132*  < > 141 141  K 3.9  < > 3.0* 3.2*  CL 96  < > 102 101  CO2 21  < > 26 26  GLUCOSE 113*  < > 86 84  BUN 13  < > 17 16  CREATININE 0.46*  < > 0.52 0.48*  CALCIUM 8.8  < > 8.9 9.0  MG 2.1  --   --   --   PHOS 3.4  --   --   --   < > =  values in this interval not displayed. Liver Function Tests:  Recent Labs Lab 01/13/14 0330 01/14/14 0656  AST 36 57*  ALT 14 30  ALKPHOS 120* 143*  BILITOT 0.3 0.3  PROT 8.2 8.6*  ALBUMIN 2.9* 3.0*   No results found for this basename: LIPASE, AMYLASE,  in the last 168 hours No results found for this basename: AMMONIA,  in the last 168 hours CBC:  Recent Labs Lab 01/10/14 0542  01/14/14 0656 01/15/14 0440  WBC 11.5*  < > 10.1 10.9*  NEUTROABS 7.0  --   --   --   HGB 9.2*  < > 8.5* 8.4*  HCT 27.8*  < > 26.2* 26.5*  MCV 72.2*  < > 73.4* 75.5*  PLT 401*  < > 403* 401*  < > = values in this  interval not displayed. Cardiac Enzymes: No results found for this basename: CKTOTAL, CKMB, CKMBINDEX, TROPONINI,  in the last 168 hours BNP: No components found with this basename: POCBNP,  CBG:  Recent Labs Lab 01/11/14 0400 01/11/14 1147 01/15/14 1159 01/15/14 1650 01/16/14 1142  GLUCAP 105* 112* 90 75 104*     Micro Results: Recent Results (from the past 240 hour(s))  CULTURE, BLOOD (ROUTINE X 2)     Status: None   Collection Time    01/09/14  7:49 PM      Result Value Ref Range Status   Specimen Description BLOOD ARM RIGHT   Final   Special Requests BOTTLES DRAWN AEROBIC AND ANAEROBIC 5CC   Final   Culture  Setup Time     Final   Value: 01/10/2014 01:07     Performed at [facility]   Culture     Final   Value: NO GROWTH 5 DAYS     Performed at [facility]   Report Status 01/16/2014 FINAL   Final  CULTURE, BLOOD (ROUTINE X 2)     Status: None   Collection Time    01/09/14  8:12 PM      Result Value Ref Range Status   Specimen Description BLOOD HAND RIGHT   Final   Special Requests BOTTLES DRAWN AEROBIC AND ANAEROBIC 5CC   Final   Culture  Setup Time     Final   Value: 01/10/2014 01:08     Performed at [facility]   Culture     Final   Value: NO GROWTH 5 DAYS     Performed at [facility]   Report Status 01/16/2014 FINAL   Final  URINE CULTURE     Status: None   Collection Time    01/09/14  9:37 PM      Result Value Ref Range Status   Specimen Description URINE, CATHETERIZED   Final   Special Requests ADDED [id number]   Final   Culture  Setup Time     Final   Value: 01/10/2014 08:53     Performed at [facility]   Colony Count     Final   Value: NO GROWTH     Performed at [facility]   Culture     Final   Value: NO GROWTH     Performed at [facility]   Report Status 01/11/2014 FINAL   Final  MRSA PCR SCREENING     Status: Abnormal   Collection Time    01/10/14  7:01 AM      Result Value  Ref Range Status   MRSA by PCR  POSITIVE (*) NEGATIVE Final   Comment:            The GeneXpert MRSA Assay (FDA     approved for NASAL specimens     only), is one component of a     comprehensive MRSA colonization     surveillance program. It is not     intended to diagnose MRSA     infection nor to guide or     monitor treatment for     MRSA infections.     CRITICAL RESULT CALLED TO, READ BACK BY AND VERIFIED WITH:     KSOR Q RN 01/10/14 0922 COSTELLO B  CLOSTRIDIUM DIFFICILE BY PCR     Status: Abnormal   Collection Time    01/10/14 10:00 AM      Result Value Ref Range Status   C difficile by pcr POSITIVE (*) NEGATIVE Final   Comment: CRITICAL RESULT CALLED TO, READ BACK BY AND VERIFIED WITH:     Saralyn Pilar RN 12:40 01/10/14 ([email or web address])    Studies/Results: Dg Chest 2 View  01/09/2014   CLINICAL DATA:  Fever. Recent diagnosis of urinary tract infection. Pain.  EXAM: CHEST  2 VIEW  COMPARISON:  01/05/2014  FINDINGS: No cardiomegaly. Unchanged aortic contours, distorted by rightward rotation.  There is chronic diffuse interstitial coarsening without pneumonia or overt edema. No effusion or pneumothorax. Stable biapical extrapleural thickening. 23 mm long triangular density overlapping the left mid chest is not definitively seen laterally and is presumably external to the patient.  IMPRESSION: Diffuse interstitial coarsening which could be congestive or bronchitic.   Electronically Signed   By: Tiburcio Pea M.D.   On: 01/09/2014 21:15   Ct Head Wo Contrast  01/13/2014   CLINICAL DATA:  Follow-up LEFT subdural hematoma. History of seizure and adrenal insufficiency.  EXAM: CT HEAD WITHOUT CONTRAST  TECHNIQUE: Contiguous axial images were obtained from the base of the skull through the vertex without intravenous contrast.  COMPARISON:  CT of the head January 09, 2014  FINDINGS: Similar to slightly decreased heterogeneous LEFT holohemispheric subdural hematoma. 12 mm LEFT-to-RIGHT midline shift  with partial effacement LEFT lateral ventricle. No hydrocephalus or CT findings of entrapment. No rebleed. No intraparenchymal hemorrhage. No acute large vascular territory infarct. Small remote bilateral basal ganglia lacunar infarcts. Moderate generalized global parenchymal brain volume loss for age.  Prominent LEFT posterior fossa extra-axial fluid collection may reflect remote subdural hematoma, unchanged. Basal cisterns are patent.  Mild paranasal sinus mucosal thickening without air-fluid levels. Mastoid air cells are well aerated. No skull fracture.  IMPRESSION: Similar to slightly decreased size of LEFT hole atmospheric acute on chronic subdural hematoma cauda 12 mm LEFT-to-RIGHT midline shift, decreased. No CT findings is hydrocephalus/entrapment.  Remote bilateral basal ganglia lacunar infarcts.  Global parenchymal brain volume loss, advanced for age.   Electronically Signed   By: Awilda Metro   On: 01/13/2014 05:16   Ct Head Wo Contrast  01/10/2014   CLINICAL DATA:  History of nontraumatic subdural hematoma. Increasing pain. Initial encounter.  EXAM: CT HEAD WITHOUT CONTRAST  TECHNIQUE: Contiguous axial images were obtained from the base of the skull through the vertex without intravenous contrast.  COMPARISON:  01/06/2014  FINDINGS: Skull and Sinuses:Negative for fracture or destructive process. The mastoids, middle ears, and imaged paranasal sinuses are clear.  Orbits: No acute abnormality.  Brain: There is a mixed density subdural collection around the left cerebral convexity which is increased from before. The collection is predominantly isodense to  hypodense, compatible with proteinaceous fluid. There are also thin high-density areas that could represent recent hemorrhage or septations. The scalloped appearance of the collection superiorly is also consistent with loculation. Rightward midline shift is increased, currently 15 mm previously 10 mm. There is marked subfalcine herniation and  early left uncal herniation which was also present previously. No evidence of ACA infarct or ventricular entrapment.  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 The brain is globally atrophic. No evidence of acute infarct or hydrocephalus.  Critical Value/emergent results were called by telephone at the time of interpretation on 01/10/2014 at 12:17 am to Dr. Denton Lank, who verbally acknowledged these results.  IMPRESSION: 1. Increased mixed density subdural hematoma around the left cerebral convexity. Midline shift has increased to 15 mm, with associated herniations described above. 2. Atrophy and chronic putaminal necrosis, as above.   Electronically Signed   By: Tiburcio Pea M.D.   On: 01/10/2014 00:24   Ct Head Wo Contrast  01/06/2014   CLINICAL DATA:  58 year old male with left subdural hematoma. Initial encounter.  EXAM: CT HEAD WITHOUT CONTRAST  TECHNIQUE: Contiguous axial images were obtained from the base of the skull through the vertex without intravenous contrast.  COMPARISON:  01/03/2014 and earlier.  FINDINGS: No acute osseous abnormality identified. No acute orbit or scalp soft tissue findings. Stable minor paranasal sinus mucosal thickening. Calcified atherosclerosis at the skull base.  Mixed density left subdural hematoma persists measuring up to 12-13 mm in thickness at the level of the operculum, and has not significantly changed since 01/02/2014.  Suggestion of a much smaller right vertex subdural hematoma measuring up to 5 mm (series 2, image 22). This was not evident on earlier exams, but is low density.  Rightward midline shift of 10 mm is stable. Stable ventricles. Basilar cisterns remain patent. No intra-axial or intraventricular hemorrhage. Stable gray-white matter differentiation throughout the brain. Age advanced cerebral volume loss. Age advanced bilateral  external capsule hypodensity. No evidence of cortically based acute infarction identified.  IMPRESSION: 1. Mixed density left subdural hematoma is stable since 01/02/2014, measuring up to 12 to 13 mm thickness at the level of the operculum. 2. Questionable small or trace right side subdural at the vertex, 5 mm. 3. Mass effect is stable, with rightward midline shift of 10 mm.   Electronically Signed   By: Augusto Gamble M.D.   On: 01/06/2014 11:08   Ct Head Wo Contrast  01/03/2014   CLINICAL DATA:  Followup subdural hematoma.  EXAM: CT HEAD WITHOUT CONTRAST  TECHNIQUE: Contiguous axial images were obtained from the base of the skull through the vertex without intravenous contrast.  COMPARISON:  Prior CT from 01/02/2014  FINDINGS: Previously identified acute on chronic left subdural hematoma again seen. This collection is not significantly changed in size measuring up to 1.3 cm in transverse diameter at the level of the left frontal lobe. Again, this measures 3 cm at the vertex. The acute blood components are slightly different in location, but overall likely not significantly changed in volume with no definite new bleeding. Left-to-right midline shift at the level of the septum pellucidum measures approximately 1.1 cm, likely not significantly changed. Ventricle size is stable without hydrocephalus.  Parenchymal volume loss with small vessel disease again noted. Remote ischemic infarcts within the bilateral external capsules again noted. No acute large vessel territory infarct.  Calvarium and scalp soft tissues within normal limits. No acute abnormality  seen about either orbit. Paranasal sinuses and mastoid air cells are clear.  IMPRESSION: 1. Stable size of acute on chronic left subdural hematoma measuring up to 1.3 cm in thickness with similar left-to-right midline shift of 1.1 cm. 2. No new intracranial abnormality.   Electronically Signed   By: Rise Mu M.D.   On: 01/03/2014 07:28   Ct Head Wo  Contrast  01/02/2014   CLINICAL DATA:  Acute onset of fever. Acute encephalopathy. Personal history of seizures.  EXAM: CT HEAD WITHOUT CONTRAST  TECHNIQUE: Contiguous axial images were obtained from the base of the skull through the vertex without intravenous contrast.  COMPARISON:  CT of the head performed 04/27/2004  FINDINGS: There is a large left-sided acute on chronic subdural hematoma. This measures approximately 1.3 cm in thickness at the level of the lateral ventricles. At the vertex, it measures up to 3 cm, though this reflects the plane of imaging. Scattered foci of acute blood are noted within the hematoma. There is marked associated mass effect, with approximately 1.4 cm of rightward midline shift.  Underlying moderate cortical volume loss is noted. Cerebellar atrophy is seen. Scattered periventricular white matter change likely reflects small vessel ischemic microangiopathy. Chronic ischemic change is noted with regard to the external capsule bilaterally.  The brainstem and fourth ventricle are within normal limits.  There is no evidence of fracture; visualized osseous structures are unremarkable in appearance. The visualized portions of the orbits are within normal limits. The paranasal sinuses and mastoid air cells are well-aerated. No significant soft tissue abnormalities are seen.  IMPRESSION: 1. Large left-sided acute on chronic subdural hematoma, measuring 1.3 cm in thickness at the level of the lateral ventricles. It measures up to 3 cm at the vertex, though this reflects the plane of imaging. Scattered foci of acute blood noted within the hematoma. Marked associated mass effect, with 1.4 cm of rightward midline shift. 2. Moderate cortical volume loss and scattered small vessel ischemic microangiopathy. Chronic ischemic change noted with regard to the external capsule bilaterally.  Critical Value/emergent results were called by telephone at the time of interpretation on 01/02/2014 at 3:57 am to  Dr. Midge Minium, who verbally acknowledged these results.   Electronically Signed   By: Roanna Raider M.D.   On: 01/02/2014 04:00   Ct Lumbar Spine Wo Contrast  01/02/2014   CLINICAL DATA:  Back pain.  No injury.  History of quadriplegia.  EXAM: CT LUMBAR SPINE WITHOUT CONTRAST  TECHNIQUE: Multidetector CT imaging of the lumbar spine was performed without intravenous contrast administration. Multiplanar CT image reconstructions were also generated.  COMPARISON:  Prior CT from 06/22/2013  FINDINGS: Vertebral bodies are normally aligned with preservation of the normal lumbar lordosis. Osteopenia noted.  Chronic anterior height loss at the superior endplate of T12 with associated degenerative endplate Schmorl's node is stable from prior study. Minimal height loss at T11 also unchanged. There is increased central height loss at the superior endplate of L5 as compared to prior CT from 06/22/2013. No discrete fracture line is seen. Otherwise, vertebral body heights are preserved.  Vertebral bodies are normally aligned with preservation of the normal lumbar lordosis.  The conus medullaris terminates at a normal level, and is grossly unremarkable.  Fatty atrophy noted within the paraspinous musculature. No acute soft tissue abnormality.  At T10-11 through L1-2, there is no disc bulge or disc protrusion. No significant canal or foraminal stenosis.  L2-3: There is mild disc bulge with bilateral facet hypertrophy. No focal disc  protrusion. No canal or foraminal stenosis.  L3-4: There is mild diffuse disc bulge with bilateral facet hypertrophy and ligamentum flavum thickening. No significant canal or foraminal stenosis. No focal disc protrusion.  L4-5: There is diffuse disc bulging. There is a superimposed right foraminal disc protrusion without definite nerve root impingement (series 3, image 109). Bilateral facet hypertrophy with ligamentum flavum thickening present. There is resultant mild canal stenosis. Mild  bilateral foraminal narrowing.  L5-S1 mild bilateral facet hypertrophy with mild diffuse disc bulge. No canal or foraminal stenosis.  IMPRESSION: 1. Increased central height loss at the superior endplate of L5 as compared to most recent CT from 11/13/2013. No definite discrete fracture line identified. Correlation with MRI could be performed if there is clinical concern for possible occult compression fracture at this level. 2. Stable anterior wedging of the T11 and T12 vertebral bodies. 3. Degenerative disc bulge with superimposed right foraminal disc protrusion at L4-5 with resultant mild canal bilateral foraminal stenosis. 4. Additional mild degenerative changes as above.   Electronically Signed   By: Rise Mu M.D.   On: 01/02/2014 04:24   Ct Abdomen Pelvis W Contrast  01/10/2014   CLINICAL DATA:  Abdominal tenderness, pain and fever.  EXAM: CT ABDOMEN AND PELVIS WITH CONTRAST  TECHNIQUE: Multidetector CT imaging of the abdomen and pelvis was performed using the standard protocol following bolus administration of intravenous contrast.  CONTRAST:  OMNIPAQUE IOHEXOL 300 MG/ML  SOLN  COMPARISON:  CT abdomen pelvis - 06/15/2012; lumbar spine CT -01/02/2014  FINDINGS: Normal hepatic contour. No discrete hepatic lesions. Normal appearance of the gallbladder. No radiopaque gallstones. No intra extrahepatic biliary duct dilatation. No ascites.  There is symmetric enhancement and excretion of the bilateral kidneys. Note is made of an approximately 2.5 cm hypo attenuating (-8 Hounsfield unit) partially exophytic left-sided renal cyst. No discrete right-sided renal lesions. No definite renal stones on this postcontrast examination. No urinary obstruction or perinephric stranding.  Normal appearance of the bilateral adrenal glands, pancreas and spleen.  Enteric contrast extends to the level of the proximal ascending colon. A gastrostomy tube balloon is appropriately insufflated within the mid body of  the stomach. There is diffuse wall thickening involving the distal sigmoid colon (image 72, series 21), extending to the level of the rectum. The sigmoid colon is noted to be redundant but otherwise normal in appearance. Normal appearance of the appendix. No pneumoperitoneum, pneumatosis or portal venous gas.  Scattered mixed calcified and noncalcified atherosclerotic plaque with a normal caliber abdominal aorta. The major branch vessels of the abdominal aorta appear widely patent on this non CTA examination though exuberant calcification is noted involving the origin of the solitary right renal artery.  No retroperitoneal, mesenteric pelvic or inguinal lymphadenopathy. No retroperitoneal, mesenteric  Limited visualization of the lower thorax demonstrates minimal dependent ground-glass atelectasis. No discrete focal airspace opacities. No pleural effusion.  Normal heart size. No pericardial effusion. Coronary artery calcifications.  No acute or aggressive osseus abnormalities. Grossly unchanged moderate (approximately 30%) compression deformity involving the L5 vertebral body, and mild (under 25%) compression deformity involving the superior endplates of the T11 and T12 vertebral bodies.  IMPRESSION: 1. Diffuse wall thickening involving the distal sigmoid colon extending through the level of the rectum, the etiology of which is not depicted as examination with differential considerations including infectious, inflammatory and (less likely) ischemic etiologies. These findings are associated with adjacent mesenteric stranding but without evidence of perforation or definable/drainable fluid collection. No enteric obstruction. 2. Appropriately positioned  gastrostomy tube. 3. Grossly unchanged moderate compression deformity involving the L5 vertebral body and mild compression deformities involving the T11 and T12 vertebral bodies. Correlation with report from lumbar spine CT performed 01/02/2014 is recommended.    Electronically Signed   By: Simonne Come M.D.   On: 01/10/2014 00:20   Dg Chest Port 1 View  01/10/2014   CLINICAL DATA:  Respiratory failure with hypoxia.  EXAM: PORTABLE CHEST - 1 VIEW  COMPARISON:  January 09, 2014.  FINDINGS: Stable cardiomediastinal silhouette. No pneumothorax is noted. Stable chronic diffuse interstitial coarsening is noted. Mild left basilar opacity is noted concerning for subsegmental atelectasis or pneumonia. Possible mild associated pleural effusion may be present. Bony thorax appears intact.  IMPRESSION: Mild left basilar opacity is noted concerning for pneumonia or subsegmental atelectasis, with possible associated pleural effusion. Follow-up radiographs are recommended.   Electronically Signed   By: Roque Lias M.D.   On: 01/10/2014 13:20   Dg Chest Port 1 View  01/05/2014   CLINICAL DATA:  Persistent cough  EXAM: PORTABLE CHEST - 1 VIEW  COMPARISON:  January 02, 2014 and June 20, 2013  FINDINGS: There is diffuse interstitial prominence without airspace consolidation. Heart is upper normal in size with pulmonary vascularity within normal limits. No adenopathy. No bone lesions.  IMPRESSION: Persistent diffuse interstitial prominence. Suspect a degree of chronic congestive heart failure. No consolidation. Apparent stable compared to recent prior study.   Electronically Signed   By: Bretta Bang M.D.   On: 01/05/2014 09:48   Dg Chest Port 1 View  01/02/2014   CLINICAL DATA:  Fever beginning yesterday. Shortness of breath and wheezing. Initial encounter.  EXAM: PORTABLE CHEST - 1 VIEW  COMPARISON:  One-view chest 06/20/2013.  FINDINGS: The patient is rotated to the right. The heart size is upper limits of normal. This is exaggerated by low lung volumes. Right peritracheal soft tissue prominence is likely vascular and projectional. Chronic interstitial coarsening is evident. The right hemidiaphragm is further elevated with asymmetric subsegmental atelectasis at the right  base.  IMPRESSION: 1. Low lung volumes with new elevation of the right hemidiaphragm. 2. Bibasilar subsegmental atelectasis, right greater than left. 3. Chronic interstitial coarsening.   Electronically Signed   By: Gennette Pac M.D.   On: 01/02/2014 00:36   Dg Abd Portable 1v  01/05/2014   CLINICAL DATA:  Fecal impaction in rectum  EXAM: PORTABLE ABDOMEN - 1 VIEW  COMPARISON:  01/04/2014  FINDINGS: A gastric feeding tube again noted. There is nonspecific nonobstructive bowel gas pattern. There is some residual stool within rectum with improvement from prior exam. Rectum measures 5 cm in diameter. Persistent some gaseous distension of sigmoid colon.  IMPRESSION: Nonobstructive small bowel bowel gas pattern. Residual stool within rectum with improvement from prior exam. Rectum measures about 5 cm in diameter. Persistent mild gaseous distension of sigmoid colon.   Electronically Signed   By: Natasha Mead M.D.   On: 01/05/2014 10:31   Dg Abd Portable 1v  01/04/2014   CLINICAL DATA:  Initial encounter for abdominal distention.  EXAM: PORTABLE ABDOMEN - 1 VIEW  COMPARISON:  One-view abdomen 01/02/2014.  FINDINGS: A gastrostomy tube is stable in appearance. Gas is present within the stomach. A distended loop of bowel is noted in the left lower quadrant. A moderate amount of stool is again seen within the rectum. The bowel gas pattern is otherwise unremarkable. The visualized soft tissues and bony thorax are unremarkable.  IMPRESSION: 1. Distended loop of bowel  in the left lower quadrant appears to be colon. This may represent a partial obstruction. Moderate stool is present in the rectum. This could be secondary to impaction. 2. The more proximal bowel is unremarkable. 3. Stable positioning of the gastrostomy tube.   Electronically Signed   By: Gennette Pac M.D.   On: 01/04/2014 09:23   Dg Abd Portable 1v  01/02/2014   CLINICAL DATA:  Gastrostomy tube.  Initial evaluation.  EXAM: PORTABLE ABDOMEN - 1 VIEW   COMPARISON:  CT 11/13/2013.  FINDINGS: What appears to be a gastrostomy tube is noted projected over left upper quadrant. Stomach is not distended. No significant bowel distention. Stool in rectum. Calcified pelvic densities consistent with phleboliths.  IMPRESSION: What appears to be a gastrostomy tube is noted projected over left upper quadrant over the stomach. No gastric distention.   Electronically Signed   By: Maisie Fus  Chase   On: 01/02/2014 10:44    Medications: Scheduled Meds: . antiseptic oral rinse  7 mL Mouth Rinse q12n4p  . baclofen  10 mg Per Tube TID  . chlorhexidine  15 mL Mouth Rinse BID  . feeding supplement (PRO-STAT SUGAR FREE 64)  30 mL Per Tube TID  . ferrous sulfate  300 mg Per Tube q morning - 10a  . fludrocortisone  0.1 mg Per Tube QPM  . folic acid  1 mg Per Tube q morning - 10a  . free water  200 mL Per Tube 4 times per day  . metronidazole  500 mg Intravenous Q8H  . olopatadine  1 drop Both Eyes q morning - 10a  . phenytoin  125 mg Per Tube BID  . polyvinyl alcohol  1 drop Both Eyes Daily  . potassium chloride  40 mEq Oral Daily  . predniSONE  10 mg Per Tube BID  . saccharomyces boulardii  250 mg Oral BID  . senna  2 tablet Per Tube Daily  . vancomycin  500 mg Per Tube 4 times per day      LOS: 7 days   Jamoni Broadfoot M.D. [facility] 01/16/2014, 1:07 PM Pager: [phone]  If 7PM-7AM, please contact night-coverage [email or web address] Password [email or web address]

## 2014-01-16 NOTE — Progress Notes (Signed)
NUTRITION FOLLOW UP  Intervention:   Osmolite 1.5 @ 55 ml/hr. Run for 20 hours daily. Turn off for one hour before and one hour after phenytoin dose.   30 ml Prostat TID.    Tube feeding regimen provides 1950 kcal (100% of needs), 114 grams of protein, and 838 ml of H2O.  Increase free water flushes, 200 ml every 6 hours, to provide additional 800 ml daily. Total free water daily: 1638 ml   Nutrition Dx:   Inadequate oral intake related to inability to eat as evidenced by NPO status; ongoing  Goal:   Pt to meet >/= 90% of their estimated nutrition needs; unmet  Monitor:   TF tolerance, weight trend, labs  Assessment:   58 y.o. male with history of quadriplegia, seizures, adrenal insufficiency, peg tube, Parkinson's Disease who was recently admitted for subdural hematoma was brought to the ER because of fever.  - TF regimen is TwoCal HN at 55 ml/hr for 20 hours. This provides 2200 kcal, and 92 gram protein  Pt is receiving tube feeds at goal rate. Osmolite 1.5 is infusing at 55 ml/hr via PEG. Pt is tolerating feeds; 0-50 ml resisuals per nursing notes.  Pt's weight has gone up 15 lbs in the past 2 weeks.  -0.12 L net fluid balance since admission Labs: low potassium, low hemoglobin  Height: Ht Readings from Last 1 Encounters:  01/10/14 6\' 2"  (1.88 m)    Weight Status:   Wt Readings from Last 1 Encounters:  01/16/14 170 lb 3.2 oz (77.202 kg)  01/02/14 155 lb 10/30/13 156 lb  Re-estimated needs:  Kcal: 1900-2100  Protein: 105-115 g  Fluid: 1.9-2.1 L/day   Skin: +1 generalized edema; stage II pressure ulcer on abdomen and deep tissue injury on buttocks  Diet Order:     Intake/Output Summary (Last 24 hours) at 01/16/14 1242 Last data filed at 01/15/14 1530  Gross per 24 hour  Intake    530 ml  Output    925 ml  Net   -395 ml    Last BM: 10/20   Labs:   Recent Labs Lab 01/11/14 0229 01/13/14 0330 01/14/14 0656 01/15/14 0440  NA 132* 139 141 141  K  3.9 3.2* 3.0* 3.2*  CL 96 100 102 101  CO2 21 26 26 26   BUN 13 15 17 16   CREATININE 0.46* 0.47* 0.52 0.48*  CALCIUM 8.8 8.9 8.9 9.0  MG 2.1  --   --   --   PHOS 3.4  --   --   --   GLUCOSE 113* 99 86 84    CBG (last 3)   Recent Labs  01/15/14 1159 01/15/14 1650 01/16/14 1142  GLUCAP 90 75 104*    Scheduled Meds: . antiseptic oral rinse  7 mL Mouth Rinse q12n4p  . baclofen  10 mg Per Tube TID  . chlorhexidine  15 mL Mouth Rinse BID  . feeding supplement (PRO-STAT SUGAR FREE 64)  30 mL Per Tube TID  . ferrous sulfate  300 mg Per Tube q morning - 10a  . fludrocortisone  0.1 mg Per Tube QPM  . folic acid  1 mg Per Tube q morning - 10a  . free water  200 mL Per Tube 3 times per day  . metronidazole  500 mg Intravenous Q8H  . olopatadine  1 drop Both Eyes q morning - 10a  . phenytoin  125 mg Per Tube BID  . polyvinyl alcohol  1 drop Both Eyes  Daily  . potassium chloride  40 mEq Oral Daily  . predniSONE  10 mg Per Tube BID  . saccharomyces boulardii  250 mg Oral BID  . senna  2 tablet Per Tube Daily  . vancomycin  500 mg Per Tube 4 times per day    Continuous Infusions: . sodium chloride Stopped (01/12/14 0500)  . feeding supplement (OSMOLITE 1.5 CAL) 1,000 mL (01/16/14 0251)    Pryor Ochoa RD, LDN Inpatient Clinical Dietitian Pager: [phone] After Hours Pager: [phone]

## 2014-01-16 NOTE — Progress Notes (Signed)
Pt tolerating tube feed well.  0 Gastric residual noted.  Will continue to monitor.  Cori Razor, RN 01/16/2014

## 2014-01-17 DIAGNOSIS — I6200 Nontraumatic subdural hemorrhage, unspecified: Secondary | ICD-10-CM | POA: Diagnosis not present

## 2014-01-17 DIAGNOSIS — R6520 Severe sepsis without septic shock: Secondary | ICD-10-CM | POA: Diagnosis not present

## 2014-01-17 DIAGNOSIS — Z886 Allergy status to analgesic agent status: Secondary | ICD-10-CM | POA: Diagnosis not present

## 2014-01-17 DIAGNOSIS — Z7952 Long term (current) use of systemic steroids: Secondary | ICD-10-CM | POA: Diagnosis not present

## 2014-01-17 DIAGNOSIS — G8250 Quadriplegia, unspecified: Secondary | ICD-10-CM | POA: Diagnosis present

## 2014-01-17 DIAGNOSIS — D649 Anemia, unspecified: Secondary | ICD-10-CM | POA: Diagnosis not present

## 2014-01-17 DIAGNOSIS — R509 Fever, unspecified: Secondary | ICD-10-CM | POA: Diagnosis not present

## 2014-01-17 DIAGNOSIS — N179 Acute kidney failure, unspecified: Secondary | ICD-10-CM | POA: Diagnosis not present

## 2014-01-17 DIAGNOSIS — G20 Parkinson's disease: Secondary | ICD-10-CM | POA: Diagnosis present

## 2014-01-17 DIAGNOSIS — E876 Hypokalemia: Secondary | ICD-10-CM | POA: Diagnosis not present

## 2014-01-17 DIAGNOSIS — R000 Tachycardia, unspecified: Secondary | ICD-10-CM | POA: Diagnosis not present

## 2014-01-17 DIAGNOSIS — R1310 Dysphagia, unspecified: Secondary | ICD-10-CM | POA: Diagnosis present

## 2014-01-17 DIAGNOSIS — G40909 Epilepsy, unspecified, not intractable, without status epilepticus: Secondary | ICD-10-CM | POA: Diagnosis present

## 2014-01-17 DIAGNOSIS — Y95 Nosocomial condition: Secondary | ICD-10-CM | POA: Diagnosis not present

## 2014-01-17 DIAGNOSIS — R4701 Aphasia: Secondary | ICD-10-CM | POA: Diagnosis present

## 2014-01-17 DIAGNOSIS — Z880 Allergy status to penicillin: Secondary | ICD-10-CM | POA: Diagnosis not present

## 2014-01-17 DIAGNOSIS — B9689 Other specified bacterial agents as the cause of diseases classified elsewhere: Secondary | ICD-10-CM | POA: Diagnosis not present

## 2014-01-17 DIAGNOSIS — Z452 Encounter for adjustment and management of vascular access device: Secondary | ICD-10-CM | POA: Diagnosis not present

## 2014-01-17 DIAGNOSIS — K529 Noninfective gastroenteritis and colitis, unspecified: Secondary | ICD-10-CM

## 2014-01-17 DIAGNOSIS — G9341 Metabolic encephalopathy: Secondary | ICD-10-CM | POA: Diagnosis not present

## 2014-01-17 DIAGNOSIS — Z79899 Other long term (current) drug therapy: Secondary | ICD-10-CM | POA: Diagnosis not present

## 2014-01-17 DIAGNOSIS — N390 Urinary tract infection, site not specified: Secondary | ICD-10-CM | POA: Diagnosis not present

## 2014-01-17 DIAGNOSIS — Z8249 Family history of ischemic heart disease and other diseases of the circulatory system: Secondary | ICD-10-CM | POA: Diagnosis not present

## 2014-01-17 DIAGNOSIS — I6203 Nontraumatic chronic subdural hemorrhage: Secondary | ICD-10-CM | POA: Diagnosis not present

## 2014-01-17 DIAGNOSIS — R6521 Severe sepsis with septic shock: Secondary | ICD-10-CM | POA: Diagnosis not present

## 2014-01-17 DIAGNOSIS — J90 Pleural effusion, not elsewhere classified: Secondary | ICD-10-CM | POA: Diagnosis not present

## 2014-01-17 DIAGNOSIS — I9589 Other hypotension: Secondary | ICD-10-CM | POA: Diagnosis not present

## 2014-01-17 DIAGNOSIS — A414 Sepsis due to anaerobes: Secondary | ICD-10-CM | POA: Diagnosis not present

## 2014-01-17 DIAGNOSIS — E2740 Unspecified adrenocortical insufficiency: Secondary | ICD-10-CM | POA: Diagnosis present

## 2014-01-17 DIAGNOSIS — E162 Hypoglycemia, unspecified: Secondary | ICD-10-CM | POA: Diagnosis not present

## 2014-01-17 DIAGNOSIS — A419 Sepsis, unspecified organism: Secondary | ICD-10-CM | POA: Diagnosis not present

## 2014-01-17 DIAGNOSIS — Z833 Family history of diabetes mellitus: Secondary | ICD-10-CM | POA: Diagnosis not present

## 2014-01-17 DIAGNOSIS — R0902 Hypoxemia: Secondary | ICD-10-CM | POA: Diagnosis not present

## 2014-01-17 DIAGNOSIS — F329 Major depressive disorder, single episode, unspecified: Secondary | ICD-10-CM | POA: Diagnosis not present

## 2014-01-17 DIAGNOSIS — Z431 Encounter for attention to gastrostomy: Secondary | ICD-10-CM | POA: Diagnosis not present

## 2014-01-17 DIAGNOSIS — E871 Hypo-osmolality and hyponatremia: Secondary | ICD-10-CM | POA: Diagnosis not present

## 2014-01-17 DIAGNOSIS — G9340 Encephalopathy, unspecified: Secondary | ICD-10-CM | POA: Diagnosis not present

## 2014-01-17 DIAGNOSIS — A047 Enterocolitis due to Clostridium difficile: Secondary | ICD-10-CM | POA: Diagnosis not present

## 2014-01-17 DIAGNOSIS — R532 Functional quadriplegia: Secondary | ICD-10-CM | POA: Diagnosis present

## 2014-01-17 DIAGNOSIS — I10 Essential (primary) hypertension: Secondary | ICD-10-CM | POA: Diagnosis not present

## 2014-01-17 DIAGNOSIS — Z931 Gastrostomy status: Secondary | ICD-10-CM | POA: Diagnosis not present

## 2014-01-17 DIAGNOSIS — Z9109 Other allergy status, other than to drugs and biological substances: Secondary | ICD-10-CM | POA: Diagnosis not present

## 2014-01-17 DIAGNOSIS — E119 Type 2 diabetes mellitus without complications: Secondary | ICD-10-CM | POA: Diagnosis not present

## 2014-01-17 LAB — GLUCOSE, CAPILLARY
GLUCOSE-CAPILLARY: 111 mg/dL — AB (ref 70–99)
Glucose-Capillary: 106 mg/dL — ABNORMAL HIGH (ref 70–99)
Glucose-Capillary: 120 mg/dL — ABNORMAL HIGH (ref 70–99)

## 2014-01-17 MED ORDER — POTASSIUM CHLORIDE CRYS ER 20 MEQ PO TBCR
40.0000 meq | EXTENDED_RELEASE_TABLET | Freq: Once | ORAL | Status: AC
  Administered 2014-01-17: 40 meq via ORAL
  Filled 2014-01-17: qty 2

## 2014-01-17 MED ORDER — WHITE PETROLATUM GEL
Status: DC | PRN
  Administered 2014-01-17: 0.2 via TOPICAL
  Filled 2014-01-17 (×3): qty 5

## 2014-01-17 MED ORDER — METRONIDAZOLE IN NACL 5-0.79 MG/ML-% IV SOLN: 500.0000 mg | Freq: Three times a day (TID) | INTRAVENOUS | Status: DC

## 2014-01-17 MED ORDER — POTASSIUM CHLORIDE CRYS ER 20 MEQ PO TBCR: 40.0000 meq | EXTENDED_RELEASE_TABLET | Freq: Every day | ORAL | Status: DC

## 2014-01-17 MED ORDER — WHITE PETROLATUM GEL: 1.0000 "application " | Status: DC | PRN

## 2014-01-17 MED ORDER — PREDNISONE 10 MG PO TABS
10.0000 mg | ORAL_TABLET | Freq: Every day | ORAL | Status: DC
Start: 2014-01-17 — End: 2014-01-28

## 2014-01-17 MED ORDER — VANCOMYCIN 50 MG/ML ORAL SOLUTION: 500.0000 mg | Freq: Four times a day (QID) | ORAL | Status: DC

## 2014-01-17 NOTE — Progress Notes (Signed)
LTAC referral placed; patient has been accepted to [facility]; Alisa with [facility] in to talk to the mother and father of the patient/ all questions answered and they are in agreement for the transfer to [facility]; clinical information faxed as requested; awaiting on room number/ number for bedside nurse to call report to; Mindi Slicker RN,BSN,MHA [phone]

## 2014-01-17 NOTE — Discharge Summary (Signed)
Physician Discharge Summary  Patient ID: [name] MRN: [id number] DOB/AGE: [birth year] 58 y.o.  Admit date: 01/09/2014 Discharge date: 01/17/2014  Primary Care Physician:  [name], MD  Discharge Diagnoses:    . Fever with acute C. difficile colitis  . Acute encephalopathy . Subdural hematoma . Adrenal insufficiency . Quadriplegia  Consults:  Critical care medicine                    Neurosurgery   Recommendations for Outpatient Follow-up:  Patient will followup with Dr. Newell Coral in one month and will need CT head without contrast.  Diet: Osmolite 1.5 CAL at 55 cc an hour Pro stat 30 mL 3 times a day Free water 200 cc every 6 hours  Please note I have tapered his prednisone to 10 mg daily, continue for 4 days then off  Allergies:   Allergies  Allergen Reactions  . Aspirin     unk reaction  . Nsaids     unk reaction  . Penicillins     unk reaction. Pt has tolerated cephalosporins in the past.     Discharge Medications:   Medication List    STOP taking these medications       bisacodyl 10 MG suppository  Commonly known as:  DULCOLAX     cefUROXime 500 MG tablet  Commonly known as:  CEFTIN     RA SALINE ENEMA RE     senna 8.6 MG Tabs tablet  Commonly known as:  SENOKOT      TAKE these medications       acetaminophen 325 MG tablet  Commonly known as:  TYLENOL  Place 2 tablets (650 mg total) into feeding tube every 6 (six) hours as needed for mild pain, fever or headache.     baclofen 10 MG tablet  Commonly known as:  LIORESAL  Give 10 mg by tube 3 (three) times daily.     cetirizine 5 MG tablet  Commonly known as:  ZYRTEC  5 mg by PEG Tube route every morning.     feeding supplement (PRO-STAT SUGAR FREE 64) Liqd  Place 30 mLs into feeding tube 3 (three) times daily.     ferrous sulfate 220 (44 FE) MG/5ML solution  330 mg by PEG Tube route every morning.     fludrocortisone 0.1 MG tablet  Commonly known as:  FLORINEF  0.1  mg by PEG Tube route every evening. Adrenocortical Insufficiency     folic acid 1 MG tablet  Commonly known as:  FOLVITE  1 mg by PEG Tube route every morning.     free water Soln  Place 200 mLs into feeding tube every 8 (eight) hours.     metroNIDAZOLE 5-0.79 MG/ML-% IVPB  Commonly known as:  FLAGYL  Inject 100 mLs (500 mg total) into the vein every 8 (eight) hours.     olopatadine 0.1 % ophthalmic solution  Commonly known as:  PATANOL  Place 1 drop into both eyes every morning.     phenytoin 125 MG/5ML suspension  Commonly known as:  DILANTIN  Place 125 mg into feeding tube 2 (two) times daily.     polyvinyl alcohol 1.4 % ophthalmic solution  Commonly known as:  LIQUIFILM TEARS  Place 1 drop into both eyes daily.     potassium chloride SA 20 MEQ tablet  Commonly known as:  K-DUR,KLOR-CON  Take 2 tablets (40 mEq total) by mouth daily.     predniSONE 10 MG tablet  Commonly known  as:  DELTASONE  Place 1 tablet (10 mg total) into feeding tube daily.     saccharomyces boulardii 250 MG capsule  Commonly known as:  FLORASTOR  Give 250 mg by tube 2 (two) times daily.     traMADol 50 MG tablet  Commonly known as:  ULTRAM  Place 1 tablet (50 mg total) into feeding tube every 6 (six) hours as needed for moderate pain.     TWOCAL HN Liqd  Give 1 Can by tube continuous. Run@ 55cc/hr for 20 hrs. Turn off pump 1 hour before and keep off until 1 hour after phenytoin dose     vancomycin 50 mg/mL oral solution  Commonly known as:  VANCOCIN  Place 10 mLs (500 mg total) into feeding tube every 6 (six) hours.         Brief H and P: For complete details please refer to admission H and P, but in brief [name] is a 58 y.o. male with history of quadriplegia, seizures, chronic anemia, renal insufficiency was brought to the ER patient was found to be increasingly drowsy. Patient's mother states that patient's urine has been increasingly odorous and fell that patient may have had a  UTI and patient's primary care physician had ordered a urine study and was placed on antibiotics yesterday despite which patient became more drowsy. Patient was brought to the ER and chest x-ray were showing some nonspecific findings in UA were showing possible UTI. Patient was mildly febrile and drowsy on exam. ABG were showing mild hypercarbia after patient was placed on BiPAP. CT of the head was ordered and were showing acute on chronic subdural hematoma with mass shift. Patient was transferred to [facility] for further workup.  Hospital Course:  58 y.o. male with history of quadriplegia, seizures, and adrenal insufficiency who was admitted for subdural hematoma 10/08 - 10/12 who was brought to the ER because of fever. Patient's mother noticed that patient was becoming increasingly lethargic.  In the ER CT of the abdom noted inflammatory changes in the sigmoid and rectal area. CT of the head noted worsening of the known subdural hematoma with worsening midline shift and herniation. Neurosurgery was consulted, and the pt was admitted to the ICU, w/ PCCM assuming his care.  Since admission, the pt has been found to have C diff colitis, w/ positive PCR and CT abdom noting colitis. His SDH has not required intervention.   Severe sepsis due to C diff colitis :  Cont enteral vanc via feeding tube - no clinical evidence of megacolon at this time, currently hemodynamically stable, afebrile  Continue vancomycin, added IV Flagyl on 10/22. Once patient has clinical improvement with the diarrhea, Flagyl can be discontinued. Continue rectal tube. Continue oral vancomycin for at least 14 days more. Can be extended to prolonged tapered does if slow improvement.   Subacute L SDH w/ midline shift  Patient evaluated by neurosurgery, patient remaining stable and at his baseline. Neurosurgery recommended conservative management as the risk of surgical intervention may outweigh benefits  Plan to monitor chronic  subdural hematoma with serial CT scans, outpatient per neurosurgery. Dr. Newell Coral outpatient with serial CT scans.  Chronic OBS - Quadriplegia - Chronic G tube  PEG tube dependent, tolerating tube feeds   Hypokalemia  Likely secondary to GI loss from diarrhea , replaced   Parkinson's disease  Sz d/o  No evidence of active seizures   Chronic adrenal insuff / hypotension  on fludrocortisone chronically, and currently on stress dose  hydrocortisone - BP stable - taper steroid as able   Day of Discharge BP 98/74  Pulse 80  Temp(Src) 98.9 F (37.2 C) (Oral)  Resp 20  Ht 6\' 2"  (1.88 m)  Wt 79.3 kg (174 lb 13.2 oz)  BMI 22.44 kg/m2  SpO2 98%  Physical Exam: General: Alert and awake  CVS: S1-S2 clear, no murmur rubs or gallops  Chest: clear to auscultation bilaterally  Abdomen: PEG tube, soft normal bowel sounds  Extremities: no cyanosis, clubbing or edema noted bilaterally  GU: Rectal tube, condom cath  The results of significant diagnostics from this hospitalization (including imaging, microbiology, ancillary and laboratory) are listed below for reference.    LAB RESULTS: Basic Metabolic Panel:  Recent Labs Lab 01/11/14 0229  01/14/14 0656 01/15/14 0440  NA 132*  < > 141 141  K 3.9  < > 3.0* 3.2*  CL 96  < > 102 101  CO2 21  < > 26 26  GLUCOSE 113*  < > 86 84  BUN 13  < > 17 16  CREATININE 0.46*  < > 0.52 0.48*  CALCIUM 8.8  < > 8.9 9.0  MG 2.1  --   --   --   PHOS 3.4  --   --   --   < > = values in this interval not displayed. Liver Function Tests:  Recent Labs Lab 01/13/14 0330 01/14/14 0656  AST 36 57*  ALT 14 30  ALKPHOS 120* 143*  BILITOT 0.3 0.3  PROT 8.2 8.6*  ALBUMIN 2.9* 3.0*   No results found for this basename: LIPASE, AMYLASE,  in the last 168 hours No results found for this basename: AMMONIA,  in the last 168 hours CBC:  Recent Labs Lab 01/14/14 0656 01/15/14 0440  WBC 10.1 10.9*  HGB 8.5* 8.4*  HCT 26.2* 26.5*  MCV 73.4* 75.5*   PLT 403* 401*   Cardiac Enzymes: No results found for this basename: CKTOTAL, CKMB, CKMBINDEX, TROPONINI,  in the last 168 hours BNP: No components found with this basename: POCBNP,  CBG:  Recent Labs Lab 01/17/14 0458 01/17/14 1104  GLUCAP 120* 111*    Significant Diagnostic Studies:  Dg Chest 2 View  01/09/2014   CLINICAL DATA:  Fever. Recent diagnosis of urinary tract infection. Pain.  EXAM: CHEST  2 VIEW  COMPARISON:  01/05/2014  FINDINGS: No cardiomegaly. Unchanged aortic contours, distorted by rightward rotation.  There is chronic diffuse interstitial coarsening without pneumonia or overt edema. No effusion or pneumothorax. Stable biapical extrapleural thickening. 23 mm long triangular density overlapping the left mid chest is not definitively seen laterally and is presumably external to the patient.  IMPRESSION: Diffuse interstitial coarsening which could be congestive or bronchitic.   Electronically Signed   By: Tiburcio Pea M.D.   On: 01/09/2014 21:15   Ct Head Wo Contrast  01/10/2014   CLINICAL DATA:  History of nontraumatic subdural hematoma. Increasing pain. Initial encounter.  EXAM: CT HEAD WITHOUT CONTRAST  TECHNIQUE: Contiguous axial images were obtained from the base of the skull through the vertex without intravenous contrast.  COMPARISON:  01/06/2014  FINDINGS: Skull and Sinuses:Negative for fracture or destructive process. The mastoids, middle ears, and imaged paranasal sinuses are clear.  Orbits: No acute abnormality.  Brain: There is a mixed density subdural collection around the left cerebral convexity which is increased from before. The collection is predominantly isodense to hypodense, compatible with proteinaceous fluid. There are also thin high-density areas that could represent recent  hemorrhage or septations. The scalloped appearance of the collection superiorly is also consistent with loculation. Rightward midline shift is increased, currently 15 mm previously  10 mm. There is marked subfalcine herniation and early left uncal herniation which was also present previously. No evidence of ACA infarct or ventricular entrapment.  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 The brain is globally atrophic. No evidence of acute infarct or hydrocephalus.  Critical Value/emergent results were called by telephone at the time of interpretation on 01/10/2014 at 12:17 am to Dr. Denton Lank, who verbally acknowledged these results.  IMPRESSION: 1. Increased mixed density subdural hematoma around the left cerebral convexity. Midline shift has increased to 15 mm, with associated herniations described above. 2. Atrophy and chronic putaminal necrosis, as above.   Electronically Signed   By: Tiburcio Pea M.D.   On: 01/10/2014 00:24   Ct Abdomen Pelvis W Contrast  01/10/2014   CLINICAL DATA:  Abdominal tenderness, pain and fever.  EXAM: CT ABDOMEN AND PELVIS WITH CONTRAST  TECHNIQUE: Multidetector CT imaging of the abdomen and pelvis was performed using the standard protocol following bolus administration of intravenous contrast.  CONTRAST:  OMNIPAQUE IOHEXOL 300 MG/ML  SOLN  COMPARISON:  CT abdomen pelvis - 06/15/2012; lumbar spine CT -01/02/2014  FINDINGS: Normal hepatic contour. No discrete hepatic lesions. Normal appearance of the gallbladder. No radiopaque gallstones. No intra extrahepatic biliary duct dilatation. No ascites.  There is symmetric enhancement and excretion of the bilateral kidneys. Note is made of an approximately 2.5 cm hypo attenuating (-8 Hounsfield unit) partially exophytic left-sided renal cyst. No discrete right-sided renal lesions. No definite renal stones on this postcontrast examination. No urinary obstruction or perinephric stranding.  Normal appearance of the bilateral adrenal glands, pancreas and spleen.  Enteric contrast  extends to the level of the proximal ascending colon. A gastrostomy tube balloon is appropriately insufflated within the mid body of the stomach. There is diffuse wall thickening involving the distal sigmoid colon (image 72, series 21), extending to the level of the rectum. The sigmoid colon is noted to be redundant but otherwise normal in appearance. Normal appearance of the appendix. No pneumoperitoneum, pneumatosis or portal venous gas.  Scattered mixed calcified and noncalcified atherosclerotic plaque with a normal caliber abdominal aorta. The major branch vessels of the abdominal aorta appear widely patent on this non CTA examination though exuberant calcification is noted involving the origin of the solitary right renal artery.  No retroperitoneal, mesenteric pelvic or inguinal lymphadenopathy. No retroperitoneal, mesenteric  Limited visualization of the lower thorax demonstrates minimal dependent ground-glass atelectasis. No discrete focal airspace opacities. No pleural effusion.  Normal heart size. No pericardial effusion. Coronary artery calcifications.  No acute or aggressive osseus abnormalities. Grossly unchanged moderate (approximately 30%) compression deformity involving the L5 vertebral body, and mild (under 25%) compression deformity involving the superior endplates of the T11 and T12 vertebral bodies.  IMPRESSION: 1. Diffuse wall thickening involving the distal sigmoid colon extending through the level of the rectum, the etiology of which is not depicted as examination with differential considerations including infectious, inflammatory and (less likely) ischemic etiologies. These findings are associated with adjacent mesenteric stranding but without evidence of perforation or definable/drainable fluid collection. No enteric obstruction. 2. Appropriately positioned gastrostomy tube. 3. Grossly unchanged moderate compression deformity involving the L5 vertebral body and mild compression deformities  involving the T11 and T12 vertebral bodies. Correlation with report from lumbar spine  CT performed 01/02/2014 is recommended.   Electronically Signed   By: Simonne Come M.D.   On: 01/10/2014 00:20   Dg Chest Port 1 View  01/10/2014   CLINICAL DATA:  Respiratory failure with hypoxia.  EXAM: PORTABLE CHEST - 1 VIEW  COMPARISON:  January 09, 2014.  FINDINGS: Stable cardiomediastinal silhouette. No pneumothorax is noted. Stable chronic diffuse interstitial coarsening is noted. Mild left basilar opacity is noted concerning for subsegmental atelectasis or pneumonia. Possible mild associated pleural effusion may be present. Bony thorax appears intact.  IMPRESSION: Mild left basilar opacity is noted concerning for pneumonia or subsegmental atelectasis, with possible associated pleural effusion. Follow-up radiographs are recommended.   Electronically Signed   By: Roque Lias M.D.   On: 01/10/2014 13:20      Disposition and Follow-up:    DISPOSITION: LTACH  DISCHARGE FOLLOW-UP Follow-up Information   Follow up with [name], MD. Schedule an appointment as soon as possible for a visit in 10 days. (for hospital follow-up)    Specialty:  Internal Medicine   Contact information:   [address] [place] [address] [phone]       Follow up with Hewitt Shorts, MD. Schedule an appointment as soon as possible for a visit in 1 month. (for hospital follow-up)    Specialty:  Neurosurgery   Contact information:   [address] [place] [address] [phone]       Time spent on Discharge: 40 mins  Signed:   Izael Bessinger M.D. [facility] 01/17/2014, 11:57 AM Pager: [phone]

## 2014-01-17 NOTE — Progress Notes (Addendum)
Discharge orders received.  Report called to Chase Caller at [facility] arrived for transport.  VSS.  Peg tube disconnected from feeding and IV saline locked. Cori Razor, RN

## 2014-01-17 NOTE — Progress Notes (Signed)
Received call from [place]acra at [facility]; pt room # 401/ accepting MD is Dr Lynnae Prude report to be called to tel # [phone] ext [phone]; information given to [facility]; transportation provided by Candis Musa RN,BSN,MHA [phone]

## 2014-01-17 NOTE — Clinical Social Work Note (Signed)
CSW received chart. The pt has been accepted to [facility]. CSW updated [employer]. The pt's family is aware. CSW will sign off.    [place], MSW, [place]

## 2014-01-25 ENCOUNTER — Inpatient Hospital Stay (HOSPITAL_COMMUNITY)
Admission: AD | Admit: 2014-01-25 | Discharge: 2014-01-28 | DRG: 871 | Disposition: A | Discharge status: Other Acute Inpatient Hospital | Payer: Medicare Other | Source: Other Acute Inpatient Hospital | Attending: Internal Medicine | Admitting: Internal Medicine

## 2014-01-25 ENCOUNTER — Inpatient Hospital Stay (HOSPITAL_COMMUNITY): Payer: Medicare Other

## 2014-01-25 DIAGNOSIS — I6200 Nontraumatic subdural hemorrhage, unspecified: Secondary | ICD-10-CM | POA: Diagnosis present

## 2014-01-25 DIAGNOSIS — R140 Abdominal distension (gaseous): Secondary | ICD-10-CM

## 2014-01-25 DIAGNOSIS — A419 Sepsis, unspecified organism: Secondary | ICD-10-CM | POA: Diagnosis not present

## 2014-01-25 DIAGNOSIS — Z9109 Other allergy status, other than to drugs and biological substances: Secondary | ICD-10-CM | POA: Diagnosis not present

## 2014-01-25 DIAGNOSIS — R569 Unspecified convulsions: Secondary | ICD-10-CM

## 2014-01-25 DIAGNOSIS — R0902 Hypoxemia: Secondary | ICD-10-CM | POA: Diagnosis present

## 2014-01-25 DIAGNOSIS — A047 Enterocolitis due to Clostridium difficile: Secondary | ICD-10-CM | POA: Diagnosis not present

## 2014-01-25 DIAGNOSIS — J9811 Atelectasis: Secondary | ICD-10-CM | POA: Diagnosis not present

## 2014-01-25 DIAGNOSIS — E119 Type 2 diabetes mellitus without complications: Secondary | ICD-10-CM | POA: Diagnosis present

## 2014-01-25 DIAGNOSIS — N390 Urinary tract infection, site not specified: Secondary | ICD-10-CM | POA: Diagnosis present

## 2014-01-25 DIAGNOSIS — G20 Parkinson's disease: Secondary | ICD-10-CM | POA: Diagnosis present

## 2014-01-25 DIAGNOSIS — E2740 Unspecified adrenocortical insufficiency: Secondary | ICD-10-CM | POA: Diagnosis present

## 2014-01-25 DIAGNOSIS — B9689 Other specified bacterial agents as the cause of diseases classified elsewhere: Secondary | ICD-10-CM | POA: Diagnosis present

## 2014-01-25 DIAGNOSIS — R6521 Severe sepsis with septic shock: Secondary | ICD-10-CM | POA: Diagnosis not present

## 2014-01-25 DIAGNOSIS — G9341 Metabolic encephalopathy: Secondary | ICD-10-CM | POA: Diagnosis present

## 2014-01-25 DIAGNOSIS — Z880 Allergy status to penicillin: Secondary | ICD-10-CM

## 2014-01-25 DIAGNOSIS — Z931 Gastrostomy status: Secondary | ICD-10-CM

## 2014-01-25 DIAGNOSIS — E871 Hypo-osmolality and hyponatremia: Secondary | ICD-10-CM | POA: Diagnosis present

## 2014-01-25 DIAGNOSIS — G9340 Encephalopathy, unspecified: Secondary | ICD-10-CM | POA: Diagnosis present

## 2014-01-25 DIAGNOSIS — Z7952 Long term (current) use of systemic steroids: Secondary | ICD-10-CM

## 2014-01-25 DIAGNOSIS — G8250 Quadriplegia, unspecified: Secondary | ICD-10-CM | POA: Diagnosis present

## 2014-01-25 DIAGNOSIS — F329 Major depressive disorder, single episode, unspecified: Secondary | ICD-10-CM | POA: Diagnosis present

## 2014-01-25 DIAGNOSIS — K8020 Calculus of gallbladder without cholecystitis without obstruction: Secondary | ICD-10-CM | POA: Diagnosis not present

## 2014-01-25 DIAGNOSIS — S065X9A Traumatic subdural hemorrhage with loss of consciousness of unspecified duration, initial encounter: Secondary | ICD-10-CM | POA: Diagnosis present

## 2014-01-25 DIAGNOSIS — D649 Anemia, unspecified: Secondary | ICD-10-CM | POA: Diagnosis present

## 2014-01-25 DIAGNOSIS — K769 Liver disease, unspecified: Secondary | ICD-10-CM

## 2014-01-25 DIAGNOSIS — S065XAA Traumatic subdural hemorrhage with loss of consciousness status unknown, initial encounter: Secondary | ICD-10-CM | POA: Diagnosis present

## 2014-01-25 DIAGNOSIS — Z4659 Encounter for fitting and adjustment of other gastrointestinal appliance and device: Secondary | ICD-10-CM

## 2014-01-25 DIAGNOSIS — I10 Essential (primary) hypertension: Secondary | ICD-10-CM | POA: Diagnosis present

## 2014-01-25 DIAGNOSIS — Z886 Allergy status to analgesic agent status: Secondary | ICD-10-CM | POA: Diagnosis not present

## 2014-01-25 DIAGNOSIS — Z79899 Other long term (current) drug therapy: Secondary | ICD-10-CM

## 2014-01-25 LAB — AMYLASE: Amylase: 57 U/L (ref 0–105)

## 2014-01-25 LAB — CBC WITH DIFFERENTIAL/PLATELET
BASOS ABS: 0 10*3/uL (ref 0.0–0.1)
Basophils Relative: 0 % (ref 0–1)
EOS ABS: 0 10*3/uL (ref 0.0–0.7)
Eosinophils Relative: 0 % (ref 0–5)
HEMATOCRIT: 24.4 % — AB (ref 39.0–52.0)
HEMOGLOBIN: 7.9 g/dL — AB (ref 13.0–17.0)
LYMPHS ABS: 1.7 10*3/uL (ref 0.7–4.0)
Lymphocytes Relative: 12 % (ref 12–46)
MCH: 23.8 pg — AB (ref 26.0–34.0)
MCHC: 32.4 g/dL (ref 30.0–36.0)
MCV: 73.5 fL — ABNORMAL LOW (ref 78.0–100.0)
MONO ABS: 0.9 10*3/uL (ref 0.1–1.0)
Monocytes Relative: 6 % (ref 3–12)
Neutro Abs: 11.8 10*3/uL — ABNORMAL HIGH (ref 1.7–7.7)
Neutrophils Relative %: 82 % — ABNORMAL HIGH (ref 43–77)
Platelets: 154 10*3/uL (ref 150–400)
RBC: 3.32 MIL/uL — ABNORMAL LOW (ref 4.22–5.81)
RDW: 14.6 % (ref 11.5–15.5)
WBC: 14.4 10*3/uL — ABNORMAL HIGH (ref 4.0–10.5)

## 2014-01-25 LAB — LIPASE, BLOOD: LIPASE: 15 U/L (ref 11–59)

## 2014-01-25 LAB — COMPREHENSIVE METABOLIC PANEL
ALBUMIN: 2 g/dL — AB (ref 3.5–5.2)
ALT: 28 U/L (ref 0–53)
AST: 78 U/L — AB (ref 0–37)
Alkaline Phosphatase: 104 U/L (ref 39–117)
Anion gap: 12 (ref 5–15)
BUN: 17 mg/dL (ref 6–23)
CO2: 18 meq/L — AB (ref 19–32)
CREATININE: 1.2 mg/dL (ref 0.50–1.35)
Calcium: 7.1 mg/dL — ABNORMAL LOW (ref 8.4–10.5)
Chloride: 109 mEq/L (ref 96–112)
GFR calc Af Amer: 76 mL/min — ABNORMAL LOW (ref >90)
GFR, EST NON AFRICAN AMERICAN: 65 mL/min — AB (ref >90)
Glucose, Bld: 113 mg/dL — ABNORMAL HIGH (ref 70–99)
Potassium: 3.8 mEq/L (ref 3.7–5.3)
SODIUM: 139 meq/L (ref 137–147)
Total Bilirubin: 0.5 mg/dL (ref 0.3–1.2)
Total Protein: 6.3 g/dL (ref 6.0–8.3)

## 2014-01-25 LAB — BLOOD GAS, ARTERIAL
Acid-base deficit: 5.1 mmol/L — ABNORMAL HIGH (ref 0.0–2.0)
Bicarbonate: 19.2 mEq/L — ABNORMAL LOW (ref 20.0–24.0)
DRAWN BY: 331761
FIO2: 0.32 %
O2 Saturation: 86.5 %
PATIENT TEMPERATURE: 99.2
PH ART: 7.37 (ref 7.350–7.450)
TCO2: 20.2 mmol/L (ref 0–100)
pCO2 arterial: 34.1 mmHg — ABNORMAL LOW (ref 35.0–45.0)
pO2, Arterial: 51.1 mmHg — ABNORMAL LOW (ref 80.0–100.0)

## 2014-01-25 LAB — LACTIC ACID, PLASMA: Lactic Acid, Venous: 2.1 mmol/L (ref 0.5–2.2)

## 2014-01-25 LAB — PROTIME-INR
INR: 2.17 — AB (ref 0.00–1.49)
Prothrombin Time: 24.4 seconds — ABNORMAL HIGH (ref 11.6–15.2)

## 2014-01-25 LAB — PRO B NATRIURETIC PEPTIDE: Pro B Natriuretic peptide (BNP): 1776 pg/mL — ABNORMAL HIGH (ref 0–125)

## 2014-01-25 LAB — TROPONIN I

## 2014-01-25 LAB — APTT: APTT: 49 s — AB (ref 24–37)

## 2014-01-25 LAB — GLUCOSE, CAPILLARY: Glucose-Capillary: 102 mg/dL — ABNORMAL HIGH (ref 70–99)

## 2014-01-25 LAB — PHOSPHORUS: Phosphorus: 2.5 mg/dL (ref 2.3–4.6)

## 2014-01-25 LAB — MAGNESIUM: MAGNESIUM: 1.4 mg/dL — AB (ref 1.5–2.5)

## 2014-01-25 LAB — FIBRINOGEN: Fibrinogen: 448 mg/dL (ref 204–475)

## 2014-01-25 MED ORDER — FOLIC ACID 1 MG PO TABS
1.0000 mg | ORAL_TABLET | Freq: Every day | ORAL | Status: DC
  Administered 2014-01-26 – 2014-01-28 (×3): 1 mg via ORAL
  Filled 2014-01-25 (×3): qty 1

## 2014-01-25 MED ORDER — SACCHAROMYCES BOULARDII 250 MG PO CAPS
250.0000 mg | ORAL_CAPSULE | Freq: Two times a day (BID) | ORAL | Status: DC
  Administered 2014-01-26 – 2014-01-28 (×6): 250 mg via ORAL
  Filled 2014-01-25 (×7): qty 1

## 2014-01-25 MED ORDER — HYDROCORTISONE NA SUCCINATE PF 100 MG IJ SOLR
50.0000 mg | Freq: Four times a day (QID) | INTRAMUSCULAR | Status: DC
  Administered 2014-01-26 – 2014-01-27 (×6): 50 mg via INTRAVENOUS
  Filled 2014-01-25 (×10): qty 1

## 2014-01-25 MED ORDER — DEXTROSE 5 % IV SOLN
2.0000 g | Freq: Two times a day (BID) | INTRAVENOUS | Status: AC
  Administered 2014-01-25: 2 g via INTRAVENOUS
  Filled 2014-01-25: qty 2

## 2014-01-25 MED ORDER — VANCOMYCIN 50 MG/ML ORAL SOLUTION
125.0000 mg | Freq: Four times a day (QID) | ORAL | Status: DC
  Administered 2014-01-26 – 2014-01-28 (×12): 125 mg
  Filled 2014-01-25 (×15): qty 2.5

## 2014-01-25 MED ORDER — DEXTROSE 5 % IV SOLN
1.0000 g | Freq: Three times a day (TID) | INTRAVENOUS | Status: DC
  Administered 2014-01-26 – 2014-01-28 (×8): 1 g via INTRAVENOUS
  Filled 2014-01-25 (×12): qty 1

## 2014-01-25 MED ORDER — VANCOMYCIN HCL 10 G IV SOLR
1250.0000 mg | Freq: Three times a day (TID) | INTRAVENOUS | Status: DC
  Administered 2014-01-25 – 2014-01-27 (×5): 1250 mg via INTRAVENOUS
  Filled 2014-01-25 (×6): qty 1250

## 2014-01-25 MED ORDER — FERROUS SULFATE 300 (60 FE) MG/5ML PO SYRP
300.0000 mg | ORAL_SOLUTION | Freq: Every day | ORAL | Status: DC
  Administered 2014-01-26 – 2014-01-28 (×3): 300 mg
  Filled 2014-01-25 (×3): qty 5

## 2014-01-25 MED ORDER — TWOCAL HN PO LIQD: 55.0000 mL | ORAL | Status: DC

## 2014-01-25 MED ORDER — NOREPINEPHRINE BITARTRATE 1 MG/ML IV SOLN
0.0000 ug/min | INTRAVENOUS | Status: DC
  Administered 2014-01-26: 12.5 ug/min via INTRAVENOUS
  Administered 2014-01-26: 10 ug/min via INTRAVENOUS
  Filled 2014-01-25 (×2): qty 4

## 2014-01-25 MED ORDER — PRO-STAT SUGAR FREE PO LIQD
30.0000 mL | Freq: Three times a day (TID) | ORAL | Status: DC
  Administered 2014-01-26 – 2014-01-28 (×8): 30 mL
  Filled 2014-01-25 (×10): qty 30

## 2014-01-25 MED ORDER — BACLOFEN 10 MG PO TABS
10.0000 mg | ORAL_TABLET | Freq: Three times a day (TID) | ORAL | Status: DC
  Administered 2014-01-26 – 2014-01-28 (×9): 10 mg
  Filled 2014-01-25 (×12): qty 1

## 2014-01-25 MED ORDER — OLOPATADINE HCL 0.1 % OP SOLN
1.0000 [drp] | Freq: Every morning | OPHTHALMIC | Status: DC
  Administered 2014-01-26 – 2014-01-28 (×3): 1 [drp] via OPHTHALMIC
  Filled 2014-01-25 (×2): qty 5

## 2014-01-25 MED ORDER — METRONIDAZOLE 50 MG/ML ORAL SUSPENSION
500.0000 mg | Freq: Three times a day (TID) | ORAL | Status: DC
Start: 2014-01-25 — End: 2014-01-28
  Administered 2014-01-25 – 2014-01-28 (×9): 500 mg
  Filled 2014-01-25 (×11): qty 10

## 2014-01-25 MED ORDER — VITAMIN K1 10 MG/ML IJ SOLN
10.0000 mg | Freq: Once | INTRAVENOUS | Status: AC
  Administered 2014-01-26: 10 mg via INTRAVENOUS
  Filled 2014-01-25: qty 1

## 2014-01-25 MED ORDER — SODIUM CHLORIDE 0.9 % IV BOLUS (SEPSIS)
2000.0000 mL | Freq: Once | INTRAVENOUS | Status: AC
  Administered 2014-01-25: 2000 mL via INTRAVENOUS

## 2014-01-25 MED ORDER — FERROUS SULFATE 220 (44 FE) MG/5ML PO ELIX
220.0000 mg | ORAL_SOLUTION | Freq: Every morning | ORAL | Status: DC
Start: 2014-01-26 — End: 2014-01-25

## 2014-01-25 MED ORDER — OSMOLITE 1.5 CAL PO LIQD
1000.0000 mL | ORAL | Status: DC
  Administered 2014-01-26: 1000 mL
  Filled 2014-01-25 (×7): qty 1000

## 2014-01-25 MED ORDER — POLYVINYL ALCOHOL 1.4 % OP SOLN
1.0000 [drp] | Freq: Every day | OPHTHALMIC | Status: DC
  Administered 2014-01-26 – 2014-01-28 (×3): 1 [drp] via OPHTHALMIC
  Filled 2014-01-25 (×2): qty 15

## 2014-01-25 MED ORDER — FREE WATER
200.0000 mL | Freq: Three times a day (TID) | Status: DC
  Administered 2014-01-26 – 2014-01-28 (×6): 200 mL

## 2014-01-25 MED ORDER — IOHEXOL 240 MG/ML SOLN: 150.0000 mL | Freq: Once | INTRAMUSCULAR | Status: AC | PRN

## 2014-01-25 MED ORDER — PHENYTOIN 125 MG/5ML PO SUSP
125.0000 mg | Freq: Two times a day (BID) | ORAL | Status: DC
  Administered 2014-01-26 – 2014-01-28 (×6): 125 mg
  Filled 2014-01-25 (×7): qty 5

## 2014-01-25 MED ORDER — SODIUM CHLORIDE 0.9 % IV SOLN
250.0000 mL | INTRAVENOUS | Status: DC | PRN
  Administered 2014-01-26: 250 mL via INTRAVENOUS

## 2014-01-25 MED ORDER — LORATADINE 10 MG PO TABS
10.0000 mg | ORAL_TABLET | Freq: Every day | ORAL | Status: DC
  Administered 2014-01-26 – 2014-01-28 (×3): 10 mg via ORAL
  Filled 2014-01-25 (×3): qty 1

## 2014-01-25 MED ORDER — WHITE PETROLATUM GEL: 1.0000 "application " | Status: DC | PRN

## 2014-01-25 MED ORDER — TRAMADOL HCL 50 MG PO TABS
50.0000 mg | ORAL_TABLET | Freq: Four times a day (QID) | ORAL | Status: DC | PRN
Start: 2014-01-25 — End: 2014-01-28
  Administered 2014-01-28: 50 mg
  Filled 2014-01-25: qty 1

## 2014-01-25 NOTE — H&P (Signed)
PULMONARY / CRITICAL CARE MEDICINE HISTORY AND PHYSICAL EXAMINATION   Name: [name] MRN: [id number] DOB: November 30, 1955    ADMISSION DATE:  01/25/2014  PRIMARY SERVICE: PCCM  CHIEF COMPLAINT:  Sepsis  BRIEF PATIENT DESCRIPTION: 58 y/o man with a history of spastic quadriplegia and dense encephalopathy admitted w/ septic shock after recent hospitalization w/ C. Diff infection.  SIGNIFICANT EVENTS / STUDIES:  Seizures prior to transfer  LINES / TUBES: L sided PICC  CULTURES: Urine (ordered/pending 10/31): - Blood (ordered/pending 10/31): - Sputum (ordered/pending 10/31): -  ANTIBIOTICS: PO Vanc (had been on at [facility]): 10/31- PO Flagyl: 10/31- IV Vanc: 10/31 - IV Cefepime: 10/31 -    HISTORY OF PRESENT ILLNESS:   [name] is a 59 y/o man with a history of neurological injury in 2004 (non-traumatic, possibly related to alcohol abuse vs central pontine myelinolysis as family reported that his injury was from "brain swelling" during hospitalization for dehydration) that has left with dense quadriplegia, seizures, adrenal insuffiencey, HTN, DM, and recent subdural hematoma of unclear etiology. He was admitted 01/02/14 with lethargy and a head CT at that time showed a large subdural hematoma with both an acute and a chronic component to it, including 1.4 cm of midline shift. Neurosurgery was consulted and reccommended conservative management with close follow-up imaging. A follow up head CT 24 and 96 hours later demonstrated stability of the hematoma, and return of his mental status to baseline. He was re-admitted 10/16 again with increasing lethargy but also with fever, and he was found to have C. Diff colitis (he had been treated for Proteus infection during his prior hospitalization). He was discharged 10/23 on PO flagyl to complete a 14-day course. He did have two other CT scans of his head which did demonstrate stability of his sub-dural. He was discharged to [facility],  and per his family, he was in his usual state of health 48 hours ago, but when family came to visit today, pt was altered. He apparently was febrile and hypotensive, so a PICC was placed. Over the course of the evening on the day of admission, he become more hypotensive and less responsive. Levophed was started and he was transferred to [facility]. At baseline, pt is able to communicate some with family members, although speech is usually confused. Has PEG.  PAST MEDICAL HISTORY :  Past Medical History  Diagnosis Date  . Quadriplegia   . Depression   . Anemia   . Parkinson's disease   . Seizures   . Adrenal insufficiency   . Dysphagia 06/19/2013  . Aphasia 06/19/2013   Past Surgical History  Procedure Laterality Date  . Peg tube placement    . Peripherally inserted central catheter insertion     Prior to Admission medications   Medication Sig Start Date End Date Taking? Authorizing Provider  acetaminophen (TYLENOL) 325 MG tablet Place 2 tablets (650 mg total) into feeding tube every 6 (six) hours as needed for mild pain, fever or headache. 01/06/14   Elease Etienne, MD  Amino Acids-Protein Hydrolys (FEEDING SUPPLEMENT, PRO-STAT SUGAR FREE 64,) LIQD Place 30 mLs into feeding tube 3 (three) times daily. 01/06/14   Elease Etienne, MD  baclofen (LIORESAL) 10 MG tablet Give 10 mg by tube 3 (three) times daily.    Historical Provider, MD  cetirizine (ZYRTEC) 5 MG tablet 5 mg by PEG Tube route every morning.     Historical Provider, MD  ferrous sulfate 220 (44 FE) MG/5ML solution 330 mg  by PEG Tube route every morning.     Historical Provider, MD  fludrocortisone (FLORINEF) 0.1 MG tablet 0.1 mg by PEG Tube route every evening. Adrenocortical Insufficiency    Historical Provider, MD  folic acid (FOLVITE) 1 MG tablet 1 mg by PEG Tube route every morning.     Historical Provider, MD  metroNIDAZOLE (FLAGYL) 5-0.79 MG/ML-% IVPB Inject 100 mLs (500 mg total) into the vein every 8 (eight)  hours. 01/17/14   Ripudeep Jenna Luo, MD  Nutritional Supplements (TWOCAL HN) LIQD Give 1 Can by tube continuous. Run@ 55cc/hr for 20 hrs. Turn off pump 1 hour before and keep off until 1 hour after phenytoin dose    Historical Provider, MD  olopatadine (PATANOL) 0.1 % ophthalmic solution Place 1 drop into both eyes every morning.     Historical Provider, MD  phenytoin (DILANTIN) 125 MG/5ML suspension Place 125 mg into feeding tube 2 (two) times daily.     Historical Provider, MD  polyvinyl alcohol (LIQUIFILM TEARS) 1.4 % ophthalmic solution Place 1 drop into both eyes daily.     Historical Provider, MD  potassium chloride SA (K-DUR,KLOR-CON) 20 MEQ tablet Take 2 tablets (40 mEq total) by mouth daily. 01/17/14   Ripudeep Jenna Luo, MD  predniSONE (DELTASONE) 10 MG tablet Place 1 tablet (10 mg total) into feeding tube daily. 01/17/14   Ripudeep Jenna Luo, MD  saccharomyces boulardii (FLORASTOR) 250 MG capsule Give 250 mg by tube 2 (two) times daily.    Historical Provider, MD  traMADol (ULTRAM) 50 MG tablet Place 1 tablet (50 mg total) into feeding tube every 6 (six) hours as needed for moderate pain. 01/07/14   Oneal Grout, MD  vancomycin (VANCOCIN) 50 mg/mL oral solution Place 10 mLs (500 mg total) into feeding tube every 6 (six) hours. 01/17/14   Ripudeep Jenna Luo, MD  Water For Irrigation, Sterile (FREE WATER) SOLN Place 200 mLs into feeding tube every 8 (eight) hours. 01/06/14   Elease Etienne, MD  white petrolatum (VASELINE) GEL Apply 1 application topically as needed for lip care or dry skin. 01/17/14   Ripudeep Jenna Luo, MD   Allergies  Allergen Reactions  . Aspirin     unk reaction  . Nsaids     unk reaction  . Penicillins     unk reaction. Pt has tolerated cephalosporins in the past.    FAMILY HISTORY:  Family History  Problem Relation Age of Onset  . Hypothyroidism Mother   . Hypertension Mother   . Diabetes Father    SOCIAL HISTORY:  reports that he has never smoked. He has never used  smokeless tobacco. He reports that he does not drink alcohol or use illicit drugs.  REVIEW OF SYSTEMS:  Cannot obtain 2/2 AMS  SUBJECTIVE: 58 y/o man with chronic CNS injury admitted with sepsis of unclear etiology.  VITAL SIGNS: Temp:  [99.2 F (37.3 C)] 99.2 F (37.3 C) (10/31 2200) Weight:  [167 lb 5.3 oz (75.9 kg)] 167 lb 5.3 oz (75.9 kg) (10/31 2200) HEMODYNAMICS: MAP ~60 on levophed of 12.5   VENTILATOR SETTINGS: maintain SpO2 on 2L [place].   INTAKE / OUTPUT: Intake/Output   None     PHYSICAL EXAMINATION: General: Somnolent man in mild distress Neuro: Does not open eyes, but will withdraw to pain. Contractures in all extremities, L worse than R. HEENT:  MMM. No stridor or glottic sounds. Normal breathing pattern. Neck: no JVD Cardiovascular: Hypotensive, heart sounds dual and normal. Lungs:  CTAB Abdomen:  Distended abd. bilious drainage from PEG. PEG site appears normal. Musculoskeletal:  Intact Skin:  Intact anteriorly.  LABS:  CBC No results found for this basename: WBC, HGB, HCT, PLT,  in the last 168 hours Coag's No results found for this basename: APTT, INR,  in the last 168 hours BMET No results found for this basename: NA, K, CL, CO2, BUN, CREATININE, GLUCOSE,  in the last 168 hours Electrolytes No results found for this basename: CALCIUM, MG, PHOS,  in the last 168 hours Sepsis Markers No results found for this basename: LATICACIDVEN, PROCALCITON, O2SATVEN,  in the last 168 hours ABG  Recent Labs Lab 01/25/14 2216  PHART 7.370  PCO2ART 34.1*  PO2ART 51.1*   Liver Enzymes No results found for this basename: AST, ALT, ALKPHOS, BILITOT, ALBUMIN,  in the last 168 hours Cardiac Enzymes No results found for this basename: TROPONINI, PROBNP,  in the last 168 hours Glucose No results found for this basename: GLUCAP,  in the last 168 hours  Imaging No results found.  EKG: None CXR: Pending  ASSESSMENT / PLAN:  Active Problems:    Sepsis   PULMONARY A: Hypoxia due to Sepsis P:   PaO2 of 50 on ABG, sats not recording well. Increasing O2 from 3L [place] to 6L [place]. Will place A-line and follow ABGs.  CARDIOVASCULAR A: Septic Shock P:   2L bolus now, maintain MAPs > 60 with levophed, may need to add vasopressin. Will treat underlying cause. Trend lactates.   RENAL A: Normal at baseline, renal function is pending. Expect at least some AKI due to sepsis. P:   Await labs, monitor electrolytes and Acid/base status. Patient has mild metabolic acidosis based on blood gas.  GASTROINTESTINAL A: C. Diff Colitis High PEG output. P:   Will obtain CT abd/pelvis and hold TFs for now. Will treat C. Diff with PO flagyl and PO vanc.  HEMATOLOGIC A: Subdural Hematoma.  P:   Holding heparin due to history of recent CNS bleed  INFECTIOUS A: Septic Shock, unclear source P:   Will follow up Chest Xray, U/A, sputum, and blood cultures. Will treat empirically with PO vanc and flagyl, IV vanc, and cefepime. Sepsis protocol.   ENDOCRINE A: History of DM P:   Sensitive SSI per protocol.  NEUROLOGIC A: Acute on Chronic Encephalopathy Subdural Hematoma P:   Likely due to sepsis, but may also be due to changes in hematoma. Will obtain CT head, and discuss with neurosurgery tomorrow.  BEST PRACTICE / DISPOSITION Level of Care:  ICU Primary Service:  PCCM Consultants:  None at present Code Status:  Full Diet:  NPO for now DVT Px:  SCD (hold heparin due to recent CNS bleed) GI Px:  H2 blocker Skin Integrity:  Per RN report Social / Family:  Updated. Had long discuss re: code status. Family has not discussed this before. Patient is full code.  TODAY'S SUMMARY: 58 y/o man with severe CNS injury admitted with sepsis of unknown origin from facility while undergoing treatment for C. Diff colitis.  I have personally obtained a history, examined the patient, evaluated laboratory and imaging results, formulated the assessment  and plan and placed orders.  CRITICAL CARE: The patient is critically ill with multiple organ systems failure and requires high complexity decision making for assessment and support, frequent evaluation and titration of therapies, application of advanced monitoring technologies and extensive interpretation of multiple databases. Critical Care Time devoted to patient care services described in this note is 124 minutes.  Jamie Kato, MD Pulmonary & Critical Care Medicine January 25, 2014, 10:51 PM

## 2014-01-25 NOTE — Progress Notes (Signed)
ANTIBIOTIC CONSULT NOTE - INITIAL  Pharmacy Consult for vancomycin, cefepime, flagyl, PO vancomycin Indication: rule out sepsis and c diff  Allergies  Allergen Reactions  . Aspirin     unk reaction  . Nsaids     unk reaction  . Penicillins     unk reaction. Pt has tolerated cephalosporins in the past.    Patient Measurements:   Ht 6'222 Wt 79.3 kg IBE 82.2 kg  Vital Signs:   Intake/Output from previous day:   Intake/Output from this shift:    Labs: No results found for this basename: WBC, HGB, PLT, LABCREA, CREATININE,  in the last 72 hours The CrCl is unknown because both a height and weight (above a minimum accepted value) are required for this calculation. No results found for this basename: VANCOTROUGH, VANCOPEAK, VANCORANDOM, [place], [place], [place], [place], [place], TOBRARND, AMIKACINPEAK, AMIKACINTROU, AMIKACIN,  in the last 72 hours   Microbiology: Recent Results (from the past 720 hour(s))  CULTURE, BLOOD (ROUTINE X 2)     Status: None   Collection Time    01/02/14 12:33 AM      Result Value Ref Range Status   Specimen Description BLOOD RIGHT HAND   Final   Special Requests BOTTLES DRAWN AEROBIC AND ANAEROBIC 5CC   Final   Culture  Setup Time     Final   Value: 01/02/2014 04:02     Performed at [place]     Final   Value: NO GROWTH 5 DAYS     Performed at [facility]   Report Status 01/08/2014 FINAL   Final  CULTURE, BLOOD (ROUTINE X 2)     Status: None   Collection Time    01/02/14 12:33 AM      Result Value Ref Range Status   Specimen Description BLOOD LEFT HAND   Final   Special Requests BOTTLES DRAWN AEROBIC AND ANAEROBIC 5CC   Final   Culture  Setup Time     Final   Value: 01/02/2014 04:03     Performed at [facility]   Culture     Final   Value: NO GROWTH 5 DAYS     Performed at [facility]   Report Status 01/08/2014 FINAL   Final  URINE CULTURE     Status: None   Collection  Time    01/02/14 12:35 AM      Result Value Ref Range Status   Specimen Description URINE, CATHETERIZED   Final   Special Requests Normal   Final   Culture  Setup Time     Final   Value: 01/02/2014 08:55     Performed at [place]     Final   Value: >=100,000 COLONIES/ML     Performed at [facility]   Culture     Final   Value: PROTEUS MIRABILIS     Performed at [facility]   Report Status 01/04/2014 FINAL   Final   Organism ID, Bacteria PROTEUS MIRABILIS   Final  MRSA PCR SCREENING     Status: None   Collection Time    01/02/14  3:59 AM      Result Value Ref Range Status   MRSA by PCR NEGATIVE  NEGATIVE Final   Comment:            The GeneXpert MRSA Assay (FDA     approved for NASAL specimens     only), is  one component of a     comprehensive MRSA colonization     surveillance program. It is not     intended to diagnose MRSA     infection nor to guide or     monitor treatment for     MRSA infections.  CULTURE, BLOOD (ROUTINE X 2)     Status: None   Collection Time    01/09/14  7:49 PM      Result Value Ref Range Status   Specimen Description BLOOD ARM RIGHT   Final   Special Requests BOTTLES DRAWN AEROBIC AND ANAEROBIC 5CC   Final   Culture  Setup Time     Final   Value: 01/10/2014 01:07     Performed at [facility]   Culture     Final   Value: NO GROWTH 5 DAYS     Performed at [facility]   Report Status 01/16/2014 FINAL   Final  CULTURE, BLOOD (ROUTINE X 2)     Status: None   Collection Time    01/09/14  8:12 PM      Result Value Ref Range Status   Specimen Description BLOOD HAND RIGHT   Final   Special Requests BOTTLES DRAWN AEROBIC AND ANAEROBIC 5CC   Final   Culture  Setup Time     Final   Value: 01/10/2014 01:08     Performed at [facility]   Culture     Final   Value: NO GROWTH 5 DAYS     Performed at [facility]   Report Status 01/16/2014 FINAL   Final  URINE CULTURE      Status: None   Collection Time    01/09/14  9:37 PM      Result Value Ref Range Status   Specimen Description URINE, CATHETERIZED   Final   Special Requests ADDED [id number]   Final   Culture  Setup Time     Final   Value: 01/10/2014 08:53     Performed at [place]     Final   Value: NO GROWTH     Performed at [facility]   Culture     Final   Value: NO GROWTH     Performed at [facility]   Report Status 01/11/2014 FINAL   Final  MRSA PCR SCREENING     Status: Abnormal   Collection Time    01/10/14  7:01 AM      Result Value Ref Range Status   MRSA by PCR POSITIVE (*) NEGATIVE Final   Comment:            The GeneXpert MRSA Assay (FDA     approved for NASAL specimens     only), is one component of a     comprehensive MRSA colonization     surveillance program. It is not     intended to diagnose MRSA     infection nor to guide or     monitor treatment for     MRSA infections.     CRITICAL RESULT CALLED TO, READ BACK BY AND VERIFIED WITH:     KSOR Q RN 01/10/14 0922 COSTELLO B  CLOSTRIDIUM DIFFICILE BY PCR     Status: Abnormal   Collection Time    01/10/14 10:00 AM      Result Value Ref Range Status   C difficile by pcr POSITIVE (*) NEGATIVE Final   Comment: CRITICAL  RESULT CALLED TO, READ BACK BY AND VERIFIED WITH:     Gladstone Pih RN 12:40 01/10/14 ([email or web address])    Medical History: Past Medical History  Diagnosis Date  . Quadriplegia   . Depression   . Anemia   . Parkinson's disease   . Seizures   . Adrenal insufficiency   . Dysphagia 06/19/2013  . Aphasia 06/19/2013    Medications:  Prescriptions prior to admission  Medication Sig Dispense Refill  . acetaminophen (TYLENOL) 325 MG tablet Place 2 tablets (650 mg total) into feeding tube every 6 (six) hours as needed for mild pain, fever or headache.      . Amino Acids-Protein Hydrolys (FEEDING SUPPLEMENT, PRO-STAT SUGAR FREE 64,) LIQD Place 30 mLs into feeding tube 3 (three)  times daily.      . baclofen (LIORESAL) 10 MG tablet Give 10 mg by tube 3 (three) times daily.      . cetirizine (ZYRTEC) 5 MG tablet 5 mg by PEG Tube route every morning.       . ferrous sulfate 220 (44 FE) MG/5ML solution 330 mg by PEG Tube route every morning.       . fludrocortisone (FLORINEF) 0.1 MG tablet 0.1 mg by PEG Tube route every evening. Adrenocortical Insufficiency      . folic acid (FOLVITE) 1 MG tablet 1 mg by PEG Tube route every morning.       . metroNIDAZOLE (FLAGYL) 5-0.79 MG/ML-% IVPB Inject 100 mLs (500 mg total) into the vein every 8 (eight) hours.  100 mL    . Nutritional Supplements (TWOCAL HN) LIQD Give 1 Can by tube continuous. Run@ 55cc/hr for 20 hrs. Turn off pump 1 hour before and keep off until 1 hour after phenytoin dose      . olopatadine (PATANOL) 0.1 % ophthalmic solution Place 1 drop into both eyes every morning.       . phenytoin (DILANTIN) 125 MG/5ML suspension Place 125 mg into feeding tube 2 (two) times daily.       . polyvinyl alcohol (LIQUIFILM TEARS) 1.4 % ophthalmic solution Place 1 drop into both eyes daily.       . potassium chloride SA (K-DUR,KLOR-CON) 20 MEQ tablet Take 2 tablets (40 mEq total) by mouth daily.      . predniSONE (DELTASONE) 10 MG tablet Place 1 tablet (10 mg total) into feeding tube daily.      Marland Kitchen saccharomyces boulardii (FLORASTOR) 250 MG capsule Give 250 mg by tube 2 (two) times daily.      . traMADol (ULTRAM) 50 MG tablet Place 1 tablet (50 mg total) into feeding tube every 6 (six) hours as needed for moderate pain.  120 tablet  5  . vancomycin (VANCOCIN) 50 mg/mL oral solution Place 10 mLs (500 mg total) into feeding tube every 6 (six) hours.      . Water For Irrigation, Sterile (FREE WATER) SOLN Place 200 mLs into feeding tube every 8 (eight) hours.      . white petrolatum (VASELINE) GEL Apply 1 application topically as needed for lip care or dry skin.    0   Assessment: 58 yo M with quadriplegia and dense encephalopathy admitted  with septic shock after recent hospitalizaion with C diff infection. Pharmacy consulted to adjust abx doses for renal function.  During his most recent admission, his vancomycin trough was 11.9 mcg/ml on 01/11/14.  The AM dose was given late, with true vanc trough likely lower on vancomycin 750 mg IV q8h and dose was  increased to 1250 mg IV q8h.  No labs drawn yet.   CULTURES:  Urine (ordered/pending 10/31): -  Blood (ordered/pending 10/31): -  Sputum (ordered/pending 10/31): -   ANTIBIOTICS:  PO Vanc (had been on at [facility]): 10/31-  PO Flagyl: 10/31-  IV Vanc: 10/31 -  IV Cefepime: 10/31 -   Goal of Therapy:  Vancomycin trough level 15-20 mcg/ml  Plan:  -cefepime 2 gm IV x 1, then cefepime 1 gm IV q8h -vancomycin 1250 mg IV q8h -flagyl 500 mg per tube q8h -oral vancomycin 125 mg per tube q6h -trend WBC, temp, Scr -drug levels ASAP given quadriplegia  Eudelia Bunch, Pharm.D. [phone] 01/25/2014 10:03 PM

## 2014-01-25 NOTE — Progress Notes (Signed)
2330 1/2 of contrast given per peg tube

## 2014-01-26 ENCOUNTER — Inpatient Hospital Stay (HOSPITAL_COMMUNITY): Payer: Medicare Other

## 2014-01-26 DIAGNOSIS — R6521 Severe sepsis with septic shock: Secondary | ICD-10-CM

## 2014-01-26 DIAGNOSIS — A419 Sepsis, unspecified organism: Principal | ICD-10-CM

## 2014-01-26 DIAGNOSIS — G9340 Encephalopathy, unspecified: Secondary | ICD-10-CM

## 2014-01-26 DIAGNOSIS — I6200 Nontraumatic subdural hemorrhage, unspecified: Secondary | ICD-10-CM

## 2014-01-26 LAB — URINE MICROSCOPIC-ADD ON

## 2014-01-26 LAB — URINALYSIS, ROUTINE W REFLEX MICROSCOPIC
Bilirubin Urine: NEGATIVE
Glucose, UA: NEGATIVE mg/dL
KETONES UR: NEGATIVE mg/dL
Leukocytes, UA: NEGATIVE
NITRITE: NEGATIVE
PH: 5 (ref 5.0–8.0)
Protein, ur: NEGATIVE mg/dL
Specific Gravity, Urine: 1.008 (ref 1.005–1.030)
Urobilinogen, UA: 0.2 mg/dL (ref 0.0–1.0)

## 2014-01-26 LAB — TROPONIN I: Troponin I: <0.3 ng/mL (ref <0.30)

## 2014-01-26 LAB — LACTIC ACID, PLASMA: Lactic Acid, Venous: 1.8 mmol/L (ref 0.5–2.2)

## 2014-01-26 LAB — GLUCOSE, CAPILLARY
GLUCOSE-CAPILLARY: 119 mg/dL — AB (ref 70–99)
Glucose-Capillary: 160 mg/dL — ABNORMAL HIGH (ref 70–99)
Glucose-Capillary: 127 mg/dL — ABNORMAL HIGH (ref 70–99)
Glucose-Capillary: 138 mg/dL — ABNORMAL HIGH (ref 70–99)

## 2014-01-26 LAB — TYPE AND SCREEN
ABO/RH(D): O NEG
Antibody Screen: NEGATIVE

## 2014-01-26 LAB — CORTISOL: CORTISOL PLASMA: 5.2 ug/dL

## 2014-01-26 LAB — STREP PNEUMONIAE URINARY ANTIGEN: STREP PNEUMO URINARY ANTIGEN: NEGATIVE

## 2014-01-26 MED ORDER — SODIUM CHLORIDE 0.9 % IV SOLN
INTRAVENOUS | Status: DC | PRN
  Administered 2014-01-27: 17:00:00 via INTRA_ARTERIAL

## 2014-01-26 NOTE — Progress Notes (Signed)
Utilization review completed.  

## 2014-01-26 NOTE — Progress Notes (Signed)
Orthopedic Tech Progress Note Patient Details:  [name] 23-Oct-1955 [id number] Delivered abdominal binder to pt's nurse.   Ortho Devices Type of Ortho Device: Abdominal binder Ortho Device/Splint Interventions: Other (comment)   Darrol Poke 01/26/2014, 10:23 AM

## 2014-01-26 NOTE — Progress Notes (Signed)
0030 rest of contrast given, CT's scheduled for 0130

## 2014-01-26 NOTE — Progress Notes (Signed)
PULMONARY / CRITICAL CARE MEDICINE HISTORY AND PHYSICAL EXAMINATION   Name: [name] MRN: [id number] DOB: [birth year]    ADMISSION DATE:  01/25/2014  PRIMARY SERVICE: PCCM  CHIEF COMPLAINT:  Sepsis  BRIEF PATIENT DESCRIPTION: 58 y/o man with a history of spastic quadriplegia and dense encephalopathy admitted w/ septic shock after recent hospitalization w/ C. Diff infection.  SIGNIFICANT EVENTS / STUDIES:  Seizures prior to transfer  LINES / TUBES: L sided PICC  CULTURES: Urine (ordered/pending 10/31): - Blood (ordered/pending 10/31): - Sputum (ordered/pending 10/31): -  ANTIBIOTICS: PO Vanc (had been on at [facility]): 10/31- PO Flagyl: 10/31- IV Vanc: 10/31 - IV Cefepime: 10/31 -    SUBJECTIVE: 58 y/o man with chronic CNS injury admitted with sepsis of unclear etiology.  VITAL SIGNS: Temp:  [97.3 F (36.3 C)-100 F (37.8 C)] 97.3 F (36.3 C) (11/01 0400) Pulse Rate:  [81-133] 85 (11/01 0930) Resp:  [22-36] 28 (11/01 0930) BP: (97-142)/(46-98) 126/64 mmHg (11/01 0930) SpO2:  [99 %-100 %] 100 % (11/01 0930) Arterial Line BP: (95-169)/(51-64) 132/56 mmHg (11/01 0930) Weight:  [75.9 kg (167 lb 5.3 oz)] 75.9 kg (167 lb 5.3 oz) (11/01 0413) HEMODYNAMICS: MAP ~60 on levophed of 12.5   VENTILATOR SETTINGS: maintain SpO2 on 2L [place].   INTAKE / OUTPUT: Intake/Output      10/31 0701 - 11/01 0700 11/01 0701 - 11/02 0700   I.V. (mL/kg) 352 (4.6) 86.9 (1.1)   IV Piggyback 600    Total Intake(mL/kg) 952 (12.5) 86.9 (1.1)   Urine (mL/kg/hr) 2575    Total Output 2575     Net -1623 +86.9          PHYSICAL EXAMINATION: General: Somnolent man in mild distress Neuro: Does not open eyes, but will withdraw to pain. Contractures in all extremities, L worse than R. HEENT:  MMM. No stridor or glottic sounds. Normal breathing pattern. Neck: no JVD Cardiovascular: Hypotensive, heart sounds dual and normal. Lungs:  CTAB Abdomen:  Distended abd. bilious drainage from PEG. PEG  site appears normal. Musculoskeletal:  Intact Skin: Large friable dark nevus on back  LABS:  CBC  Recent Labs Lab 01/25/14 2225  WBC 14.4*  HGB 7.9*  HCT 24.4*  PLT 154   Coag's  Recent Labs Lab 01/25/14 2225  APTT 49*  INR 2.17*   BMET  Recent Labs Lab 01/25/14 2225  NA 139  K 3.8  CL 109  CO2 18*  BUN 17  CREATININE 1.20  GLUCOSE 113*   Electrolytes  Recent Labs Lab 01/25/14 2225  CALCIUM 7.1*  MG 1.4*  PHOS 2.5   Sepsis Markers  Recent Labs Lab 01/25/14 2225 01/26/14 0140  LATICACIDVEN 2.1 1.8   ABG  Recent Labs Lab 01/25/14 2216  PHART 7.370  PCO2ART 34.1*  PO2ART 51.1*   Liver Enzymes  Recent Labs Lab 01/25/14 2225  AST 78*  ALT 28  ALKPHOS 104  BILITOT 0.5  ALBUMIN 2.0*   Cardiac Enzymes  Recent Labs Lab 01/25/14 2225 01/26/14 0250  TROPONINI <0.30 <0.30  PROBNP 1776.0*  --    Glucose  Recent Labs Lab 01/25/14 2355 01/26/14 0746  GLUCAP 102* 119*    Imaging Ct Abdomen Pelvis Wo Contrast  01/26/2014   CLINICAL DATA:  Elevated white blood cell count, abdominal distention and agitation.  EXAM: CT ABDOMEN AND PELVIS WITHOUT CONTRAST  TECHNIQUE: Multidetector CT imaging of the abdomen and pelvis was performed following the standard protocol without IV contrast.  COMPARISON:  01/09/2014  FINDINGS: Visualized lung  bases show bibasilar atelectasis and trace amount of pleural fluid bilaterally.  Unenhanced appearance of the liver, gallbladder, pancreas, spleen, adrenal glands and kidneys are unremarkable. Bowel shows no evidence of obstruction or significant ileus. No free intraperitoneal air identified. In the pelvis, degree of thickening noted of the sigmoid colon and rectum on the prior exam appears improved with a rectal tube now present. There remains stable soft tissue stranding in the presacral region without focal abscess.  No ascites identified. Stable appearance of balloon retention gastrostomy tube in the lumen of  the stomach. The bladder is decompressed by a Foley catheter. No hernias, masses or enlarged lymph nodes seen. Bony structures show osteopenia and Stable mild compression fractures of the T11, T12 and L5 vertebral bodies.  IMPRESSION: 1. Bibasilar atelectasis with new trace bilateral pleural effusions. 2. Improved thickening of the sigmoid colon and rectum with rectal decompression tube now present. No evidence of acute bowel obstruction. 3. Stable soft tissue stranding in the presacral space without focal abscess.   Electronically Signed   By: Irish Lack M.D.   On: 01/26/2014 09:53   Ct Head Wo Contrast  01/26/2014   CLINICAL DATA:  Seizure.  Subdural hematoma  EXAM: CT HEAD WITHOUT CONTRAST  TECHNIQUE: Contiguous axial images were obtained from the base of the skull through the vertex without intravenous contrast.  COMPARISON:  CT 01/13/2014  FINDINGS: This study is significantly degraded by motion. Two attempts were made however both are degraded by significant motion.  Left-sided subdural hematoma is predominant low-density with several small areas of intermediate density. This is smaller compared with the prior study. There is less mass-effect on the left cerebral hemisphere. No new hemorrhage is present since the prior study.  13 mm midline shift to the right appears slightly improved. Negative for hydrocephalus. Left posterior fossa subdural hygroma unchanged.  Generalized atrophy. No acute infarct. Chronic ischemia in the basal ganglia external capsule bilaterally.  IMPRESSION: Left-sided subdural hematoma is mildly improved in size without interval acute hemorrhage. Improvement in mass effect and midline shift since the prior CT.   Electronically Signed   By: Marlan Palau M.D.   On: 01/26/2014 09:55   Dg Chest Port 1 View  01/25/2014   CLINICAL DATA:  Sepsis.  EXAM: PORTABLE CHEST - 1 VIEW  COMPARISON:  01/10/2014  FINDINGS: There is a left arm PICC line with tip in the projection of the  expected location of the left innominate vein. Heart size is normal. Lung volumes are low and there is asymmetric elevation of the right hemidiaphragm. Small pleural effusions and mild interstitial edema is noted.  IMPRESSION: The left arm PICC line tip is in the expected location of the left innominate vein.  Pulmonary edema and small effusions.   Electronically Signed   By: Signa Kell M.D.   On: 01/25/2014 23:56    EKG: None CXR: Pending  ASSESSMENT / PLAN:  Active Problems:   Sepsis   PULMONARY A: Hypoxia due to Sepsis P:   Oxygenation improved, wean O2 as able  CARDIOVASCULAR A: Septic Shock Baseline relative hypotension P:   IVF resuscitation Wean norepi for SBP > 90, MAP > 60  RENAL A: Mild ARF due to sepsis P:   Follow BMP and UOP with volume  GASTROINTESTINAL A: Recent C. Diff Colitis (still being treated). CT abd > improved sigmoid/rectal thickening High PEG output. P:   Hold TF PO flagyl and PO vanc.  HEMATOLOGIC A: Chronic hx Subdural Hematoma.  P:  Holding heparin due to history of recent CNS bleed  INFECTIOUS Urine 10/31 >>  Blood 10/31 >>  Sputum 10/31 >>  C diff 11/1 >>   PO Vanc (had been on at [facility]): 10/31 >  PO Flagyl: 10/31 >  IV Vanc: 10/31 >  IV Cefepime: 10/31 >  A: Septic Shock, unclear source.  CT abd suggests he ahs responded to rx for his C diff. There are B small infiltrates and effusions but no overt infiltrate P:   Follow cx data [place] abx as above, including PO vanco + Po flagyl   ENDOCRINE A: History of DM P:   Sensitive SSI per protocol.  DERM:  May need derm re-eval for his nevus on his back > not clear that this has ever been bx'd  NEUROLOGIC A: Acute on Chronic Encephalopathy Subdural Hematoma > mild improvement on CT head 11/1 P:   Likely due to sepsis, Head CT scan stable to improved.   BEST PRACTICE / DISPOSITION Level of Care:  ICU Primary Service:  PCCM Consultants:  None at present Code  Status:  Full Diet:  NPO for now DVT Px:  SCD (hold heparin due to recent CNS bleed) GI Px:  H2 blocker Skin Integrity:  Per RN report Social / Family:  Updated. Had long discuss re: code status. Family has not discussed this before. Patient is full code.  TODAY'S SUMMARY: 58 y/o man with severe CNS injury admitted with sepsis of unknown origin from facility while undergoing treatment for C. Diff colitis.  I have personally obtained a history, examined the patient, evaluated laboratory and imaging results, formulated the assessment and plan and placed orders.  CRITICAL CARE: The patient is critically ill with multiple organ systems failure and requires high complexity decision making for assessment and support, frequent evaluation and titration of therapies, application of advanced monitoring technologies and extensive interpretation of multiple databases. Critical Care Time devoted to patient care services described in this note is 35 minutes.    Levy Pupa, MD, PhD 01/26/2014, 10:30 AM  Pulmonary and Critical Care [phone] or if no answer [phone]

## 2014-01-26 NOTE — Procedures (Signed)
Arterial Catheter Insertion Procedure Note [name] [id number] [birth year]  Procedure: Insertion of Arterial Catheter  Indications: Blood pressure monitoring and Frequent blood sampling  Procedure Details Consent: Risks of procedure as well as the alternatives and risks of each were explained to the (patient/caregiver).  Consent for procedure obtained. Time Out: Verified patient identification, verified procedure, site/side was marked, verified correct patient position, special equipment/implants available, medications/allergies/relevent history reviewed, required imaging and test results available.  Performed  Maximum sterile technique was used including antiseptics, cap, gloves, gown, hand hygiene, mask and sheet. Skin prep: Chlorhexidine; local anesthetic administered 20 gauge catheter was inserted into right radial artery using the Seldinger technique.  Evaluation Blood flow good; BP tracing good. Complications: No apparent complications. Assisted by Willey Blade RRT RCP   Clare Gandy, Chales Abrahams 01/26/2014

## 2014-01-27 DIAGNOSIS — A047 Enterocolitis due to Clostridium difficile: Secondary | ICD-10-CM

## 2014-01-27 DIAGNOSIS — N390 Urinary tract infection, site not specified: Secondary | ICD-10-CM

## 2014-01-27 LAB — GLUCOSE, CAPILLARY
GLUCOSE-CAPILLARY: 127 mg/dL — AB (ref 70–99)
GLUCOSE-CAPILLARY: 124 mg/dL — AB (ref 70–99)
GLUCOSE-CAPILLARY: 142 mg/dL — AB (ref 70–99)
Glucose-Capillary: 146 mg/dL — ABNORMAL HIGH (ref 70–99)
Glucose-Capillary: 158 mg/dL — ABNORMAL HIGH (ref 70–99)

## 2014-01-27 LAB — CBC
HCT: 23.2 % — ABNORMAL LOW (ref 39.0–52.0)
HEMOGLOBIN: 7.6 g/dL — AB (ref 13.0–17.0)
MCH: 23.5 pg — ABNORMAL LOW (ref 26.0–34.0)
MCHC: 32.8 g/dL (ref 30.0–36.0)
MCV: 71.8 fL — ABNORMAL LOW (ref 78.0–100.0)
Platelets: 127 10*3/uL — ABNORMAL LOW (ref 150–400)
RBC: 3.23 MIL/uL — ABNORMAL LOW (ref 4.22–5.81)
RDW: 14.7 % (ref 11.5–15.5)
WBC: 20.5 10*3/uL — AB (ref 4.0–10.5)

## 2014-01-27 LAB — BASIC METABOLIC PANEL
ANION GAP: 11 (ref 5–15)
BUN: 17 mg/dL (ref 6–23)
CHLORIDE: 113 meq/L — AB (ref 96–112)
CO2: 23 mEq/L (ref 19–32)
Calcium: 8.1 mg/dL — ABNORMAL LOW (ref 8.4–10.5)
Creatinine, Ser: 0.59 mg/dL (ref 0.50–1.35)
GFR calc Af Amer: >90 mL/min (ref >90)
GFR calc non Af Amer: >90 mL/min (ref >90)
Glucose, Bld: 132 mg/dL — ABNORMAL HIGH (ref 70–99)
Potassium: 3.2 mEq/L — ABNORMAL LOW (ref 3.7–5.3)
SODIUM: 147 meq/L (ref 137–147)

## 2014-01-27 LAB — VANCOMYCIN, TROUGH: Vancomycin Tr: 31.2 ug/mL (ref 10.0–20.0)

## 2014-01-27 MED ORDER — ONDANSETRON HCL 4 MG/2ML IJ SOLN
4.0000 mg | Freq: Four times a day (QID) | INTRAMUSCULAR | Status: DC | PRN
  Administered 2014-01-27 (×2): 4 mg via INTRAVENOUS
  Filled 2014-01-27 (×2): qty 2

## 2014-01-27 MED ORDER — WHITE PETROLATUM GEL
Status: AC
  Filled 2014-01-27: qty 5

## 2014-01-27 MED ORDER — SODIUM CHLORIDE 0.9 % IV SOLN: INTRAVENOUS | Status: DC | PRN

## 2014-01-27 MED ORDER — HYDROCORTISONE NA SUCCINATE PF 100 MG IJ SOLR
50.0000 mg | Freq: Two times a day (BID) | INTRAMUSCULAR | Status: DC
  Administered 2014-01-27 – 2014-01-28 (×3): 50 mg via INTRAVENOUS
  Filled 2014-01-27 (×3): qty 1

## 2014-01-27 NOTE — Progress Notes (Signed)
Pt vomiting, continued over 15-20 minutes. Simeon Craft, RN called Elink for me to request anti-emetic and reported that tube feedings were being held for now.   Anti-emetic has been adminstered, refer to [facility]. Miyoko Hashimi GARNER

## 2014-01-27 NOTE — Progress Notes (Signed)
PULMONARY / CRITICAL CARE MEDICINE HISTORY AND PHYSICAL EXAMINATION   Name: [name] MRN: [id number] DOB: Jan 20, 1956    ADMISSION DATE:  01/25/2014  PRIMARY SERVICE: PCCM  CHIEF COMPLAINT:  Sepsis  BRIEF PATIENT DESCRIPTION: 58 y/o man with a history of spastic quadriplegia and dense encephalopathy admitted w/ septic shock after recent hospitalization w/ C. Diff infection.  SIGNIFICANT EVENTS / STUDIES:  Seizures prior to transfer 11/1 RUQ U/S> cholelithiasis 11/1 CT head > improved chronic SDU 11/1 CT abdomen> improved bowel distension, stable pelvic stranding  LINES / TUBES: L sided PICC Placed prior to admission  CULTURES: Urine (ordered/pending 10/31): - aerobic gram neg rod Blood (ordered/pending 10/31): - Sputum (ordered/pending 10/31): -  ANTIBIOTICS: PO Vanc (had been on at [facility]): 10/31- PO Flagyl: 10/31- IV Vanc: 10/31 - IV Cefepime: 10/31 -    SUBJECTIVE:  Off pressors since 1130 11/1 AM  VITAL SIGNS: Temp:  [97.9 F (36.6 C)-98 F (36.7 C)] 98 F (36.7 C) (11/02 0356) Pulse Rate:  [81-100] 81 (11/02 0800) Resp:  [15-32] 15 (11/02 0800) BP: (86-161)/(46-141) 116/73 mmHg (11/02 0800) SpO2:  [93 %-100 %] 99 % (11/02 0800) Arterial Line BP: (67-150)/(48-75) 69/62 mmHg (11/02 0800) HEMODYNAMICS: MAP ~60 on levophed of 12.5   VENTILATOR SETTINGS: maintain SpO2 on 2L [place].   INTAKE / OUTPUT: Intake/Output      11/01 0701 - 11/02 0700 11/02 0701 - 11/03 0700   I.V. (mL/kg) 374.4 (4.9)    Other 70    NG/GT 671.7    IV Piggyback 600    Total Intake(mL/kg) 1716.1 (22.6)    Urine (mL/kg/hr) 450 (0.2)    Emesis/NG output 150 (0.1)    Stool 200 (0.1)    Total Output 800     Net +916.1            PHYSICAL EXAMINATION:  Gen: groans, no verbal response, awake HEENT: NCAT OP clear PULM: CTA B CV: RRR, no mgr AB: BS+, binder in place, PEG Ext: contractures, mild chronic edema Neuro: Awake, groans, no verbal response  LABS:  CBC  Recent  Labs Lab 01/25/14 2225  WBC 14.4*  HGB 7.9*  HCT 24.4*  PLT 154   Coag's  Recent Labs Lab 01/25/14 2225  APTT 49*  INR 2.17*   BMET  Recent Labs Lab 01/25/14 2225  NA 139  K 3.8  CL 109  CO2 18*  BUN 17  CREATININE 1.20  GLUCOSE 113*   Electrolytes  Recent Labs Lab 01/25/14 2225  CALCIUM 7.1*  MG 1.4*  PHOS 2.5   Sepsis Markers  Recent Labs Lab 01/25/14 2225 01/26/14 0140  LATICACIDVEN 2.1 1.8   ABG  Recent Labs Lab 01/25/14 2216  PHART 7.370  PCO2ART 34.1*  PO2ART 51.1*   Liver Enzymes  Recent Labs Lab 01/25/14 2225  AST 78*  ALT 28  ALKPHOS 104  BILITOT 0.5  ALBUMIN 2.0*   Cardiac Enzymes  Recent Labs Lab 01/25/14 2225 01/26/14 0250 01/26/14 1020  TROPONINI <0.30 <0.30 <0.30  PROBNP 1776.0*  --   --    Glucose  Recent Labs Lab 01/25/14 2355 01/26/14 0502 01/26/14 0746 01/26/14 1119 01/26/14 1548 01/26/14 2034  GLUCAP 102* 124* 119* 160* 127* 138*    Imaging Ct Abdomen Pelvis Wo Contrast  01/26/2014   CLINICAL DATA:  Elevated white blood cell count, abdominal distention and agitation.  EXAM: CT ABDOMEN AND PELVIS WITHOUT CONTRAST  TECHNIQUE: Multidetector CT imaging of the abdomen and pelvis was performed following the standard  protocol without IV contrast.  COMPARISON:  01/09/2014  FINDINGS: Visualized lung bases show bibasilar atelectasis and trace amount of pleural fluid bilaterally.  Unenhanced appearance of the liver, gallbladder, pancreas, spleen, adrenal glands and kidneys are unremarkable. Bowel shows no evidence of obstruction or significant ileus. No free intraperitoneal air identified. In the pelvis, degree of thickening noted of the sigmoid colon and rectum on the prior exam appears improved with a rectal tube now present. There remains stable soft tissue stranding in the presacral region without focal abscess.  No ascites identified. Stable appearance of balloon retention gastrostomy tube in the lumen of the  stomach. The bladder is decompressed by a Foley catheter. No hernias, masses or enlarged lymph nodes seen. Bony structures show osteopenia and Stable mild compression fractures of the T11, T12 and L5 vertebral bodies.  IMPRESSION: 1. Bibasilar atelectasis with new trace bilateral pleural effusions. 2. Improved thickening of the sigmoid colon and rectum with rectal decompression tube now present. No evidence of acute bowel obstruction. 3. Stable soft tissue stranding in the presacral space without focal abscess.   Electronically Signed   By: Irish Lack M.D.   On: 01/26/2014 09:53   Ct Head Wo Contrast  01/26/2014   CLINICAL DATA:  Seizure.  Subdural hematoma  EXAM: CT HEAD WITHOUT CONTRAST  TECHNIQUE: Contiguous axial images were obtained from the base of the skull through the vertex without intravenous contrast.  COMPARISON:  CT 01/13/2014  FINDINGS: This study is significantly degraded by motion. Two attempts were made however both are degraded by significant motion.  Left-sided subdural hematoma is predominant low-density with several small areas of intermediate density. This is smaller compared with the prior study. There is less mass-effect on the left cerebral hemisphere. No new hemorrhage is present since the prior study.  13 mm midline shift to the right appears slightly improved. Negative for hydrocephalus. Left posterior fossa subdural hygroma unchanged.  Generalized atrophy. No acute infarct. Chronic ischemia in the basal ganglia external capsule bilaterally.  IMPRESSION: Left-sided subdural hematoma is mildly improved in size without interval acute hemorrhage. Improvement in mass effect and midline shift since the prior CT.   Electronically Signed   By: Marlan Palau M.D.   On: 01/26/2014 09:55   Dg Chest Port 1 View  01/25/2014   CLINICAL DATA:  Sepsis.  EXAM: PORTABLE CHEST - 1 VIEW  COMPARISON:  01/10/2014  FINDINGS: There is a left arm PICC line with tip in the projection of the expected  location of the left innominate vein. Heart size is normal. Lung volumes are low and there is asymmetric elevation of the right hemidiaphragm. Small pleural effusions and mild interstitial edema is noted.  IMPRESSION: The left arm PICC line tip is in the expected location of the left innominate vein.  Pulmonary edema and small effusions.   Electronically Signed   By: Signa Kell M.D.   On: 01/25/2014 23:56   US Abdomen Limited Ruq  01/26/2014   CLINICAL DATA:  Sepsis  EXAM: US ABDOMEN LIMITED - RIGHT UPPER QUADRANT  COMPARISON:  CT abdomen and pelvis January 26, 2014  FINDINGS: Gallbladder:  Within the gallbladder, there are multiple echogenic foci which move and shadow consistent with gallstones. Largest gallstone measures 6 mm in length. There is no gallbladder wall thickening or pericholecystic fluid. No sonographic Murphy sign noted.  Common bile duct:  Diameter: 4 mm. There is no intrahepatic or extrahepatic biliary duct dilatation.  Liver:  No focal lesion identified. Within normal limits  in parenchymal echogenicity.  IMPRESSION: Cholelithiasis.   Electronically Signed   By: Bretta Bang M.D.   On: 01/26/2014 14:55    EKG: None CXR: Pending  ASSESSMENT / PLAN:  Active Problems:   Acute encephalopathy   Subdural hematoma   Sepsis   Septic shock   PULMONARY A: Hypoxia due to Sepsis P:   Oxygenation improved, wean O2 as able  CARDIOVASCULAR A: Septic Shock Baseline relative hypotension P:   IVF resuscitation D/c nor-epi Wean stress dose steroids  RENAL A: Mild ARF due to sepsis > good UOP P:   Follow BMP and UOP with volume  GASTROINTESTINAL A: C. Diff Colitis >.  CT abd > improved sigmoid/rectal thickening P:   Continue TF PO flagyl and PO vanc.  HEMATOLOGIC A: Chronic hx Subdural Hematoma.  P:   Holding heparin due to history of recent CNS bleed  INFECTIOUS Urine 10/31 >> gram neg rods Blood 10/31 >>  Sputum 10/31 >>   PO Vanc (had been on at  [facility]): 10/31 >  PO Flagyl: 10/31 >  IV Vanc: 10/31 >  IV Cefepime: 10/31 >  A: Septic Shock, UTI P:   Cefepime until organism identified Continue PO vanco + Po flagyl  ENDOCRINE A: History of DM P:   Sensitive SSI per protocol. Wean solu-cortef to off  DERM:  May need derm re-eval for his nevus on his back > not clear that this has ever been bx'd  NEUROLOGIC A: Acute on Chronic Encephalopathy Subdural Hematoma > mild improvement on CT head 11/1 P:   Likely due to sepsis, Head CT scan stable to improved.   BEST PRACTICE / DISPOSITION Level of Care:  ICU > transfer to floor Primary Service:  PCCM Consultants:  None at present Code Status:  Full Diet:  Tube feedings DVT Px:  SCD (hold heparin due to recent CNS bleed) GI Px:  H2 blocker Skin Integrity:  Per RN report Social / Family:  Updated. Had long discuss re: code status. Family has not discussed this before. Patient is full code.  TODAY'S SUMMARY: 58 y/o man with severe CNS injury admitted with urosepsis from [facility] while undergoing treatment for C. Diff colitis.  Anticipate discharge back to [facility] on 11/3.   Heber Avalon, MD [place] P[facility]M Pager: [phone] Cell: [phone] If no response, call [phone]

## 2014-01-27 NOTE — Clinical Documentation Improvement (Signed)
Hgb and HCT as below.  Please document any associated clinical conditions in your progress note and carry over to the discharge summary.     Component      RBC Hemoglobin  Latest Ref Rng      4.22 - 5.81 MIL/uL 13.0 - 17.0 g/dL  01/25/2014     10:25 PM 3.32 (L) 7.9 (L)  01/27/2014      3.23 (L) 7.6 (L)   Possible Clinical Conditions: -Anemia (if present, please specify acuity and type) -Other Condition  -Not clinically significant -Unable to determine at present   Thank you, Mateo Flow, RN [phone] Clinical Documentation Specialist

## 2014-01-27 NOTE — Progress Notes (Signed)
[facility] PCCM  Lab called to clarify culture result  HE HAS GNR in BLOOD, not URINE  Will remove PICC, await speciation  Roselie Awkward, MD [place] [facility]M Pager: [phone] Cell: [phone] If no response, call [phone]

## 2014-01-27 NOTE — Progress Notes (Signed)
Pt vomiting/bed change during this time

## 2014-01-27 NOTE — Progress Notes (Signed)
CRITICAL VALUE ALERT  Critical value received:  Vanc Trough: 31.2  Date of notification:  111/04/2013  Time of notification:  0610  Critical value read back:Yes.    Nurse who received alert:  Vita Barley

## 2014-01-27 NOTE — Progress Notes (Signed)
ANTIBIOTIC CONSULT NOTE   Pharmacy Consult for vancomycin Indication: rule out sepsis   Allergies  Allergen Reactions  . Aspirin Other (See Comments)    Unknown reaction  . Nsaids Other (See Comments)    Unknown reaction  . Penicillins Other (See Comments)    Unknown reaction.  Pt has tolerated cephalosporins in the past.    Patient Measurements: Height: 6\' 2"  (188 cm) Weight: 167 lb 5.3 oz (75.9 kg) IBW/kg (Calculated) : 82.2 Ht 6'2 Wt 79.3 kg IBE 82.2 kg  Vital Signs: Temp: 98 F (36.7 C) (11/02 0356) Temp Source: Oral (11/02 0356) BP: 104/61 mmHg (11/02 0600) Pulse Rate: 86 (11/02 0600) Intake/Output from previous day: 11/01 0701 - 11/02 0700 In: 1716.1 [I.V.:374.4; NG/GT:671.7; IV Piggyback:600] Out: 800 [Urine:450; Emesis/NG output:150; Stool:200] Intake/Output from this shift:    Labs:  Recent Labs  01/25/14 2225  WBC 14.4*  HGB 7.9*  PLT 154  CREATININE 1.20   Estimated Creatinine Clearance: 72.9 mL/min (by C-G formula based on Cr of 1.2).  Recent Labs  01/27/14 0610  VANCOTROUGH 31.2*     Microbiology: Recent Results (from the past 720 hour(s))  Culture, blood (routine x 2)     Status: None   Collection Time: 01/02/14 12:33 AM  Result Value Ref Range Status   Specimen Description BLOOD RIGHT HAND  Final   Special Requests BOTTLES DRAWN AEROBIC AND ANAEROBIC 5CC  Final   Culture  Setup Time   Final    01/02/2014 04:02 Performed at [place]   Final    NO GROWTH 5 DAYS Performed at [facility]   Report Status 01/08/2014 FINAL  Final  Culture, blood (routine x 2)     Status: None   Collection Time: 01/02/14 12:33 AM  Result Value Ref Range Status   Specimen Description BLOOD LEFT HAND  Final   Special Requests BOTTLES DRAWN AEROBIC AND ANAEROBIC 5CC  Final   Culture  Setup Time   Final    01/02/2014 04:03 Performed at [place]   Final    NO GROWTH 5 DAYS Performed at [facility]   Report Status 01/08/2014 FINAL  Final  Urine culture     Status: None   Collection Time: 01/02/14 12:35 AM  Result Value Ref Range Status   Specimen Description URINE, CATHETERIZED  Final   Special Requests Normal  Final   Culture  Setup Time   Final    01/02/2014 08:55 Performed at [place]   Final    >=100,000 COLONIES/ML Performed at [place] Performed at [facility]   Report Status 01/04/2014 FINAL  Final   Organism ID, Bacteria PROTEUS MIRABILIS  Final      Susceptibility   Proteus mirabilis - MIC*    AMPICILLIN <=2 SENSITIVE Sensitive     CEFAZOLIN <=4 SENSITIVE Sensitive     CEFTRIAXONE <=1 SENSITIVE Sensitive     CIPROFLOXACIN >=4 RESISTANT Resistant     GENTAMICIN <=1 SENSITIVE Sensitive     LEVOFLOXACIN >=8 RESISTANT Resistant     NITROFURANTOIN 128 RESISTANT Resistant     TOBRAMYCIN <=1 SENSITIVE Sensitive     TRIMETH/SULFA >=320 RESISTANT Resistant     PIP/TAZO <=4 SENSITIVE Sensitive     * PROTEUS MIRABILIS  MRSA PCR Screening     Status: None   Collection Time: 01/02/14  3:59 AM  Result Value Ref Range Status   MRSA by PCR NEGATIVE NEGATIVE Final    Comment:        The GeneXpert MRSA Assay (FDA approved for NASAL specimens only), is one component of a comprehensive MRSA colonization surveillance program. It is not intended to diagnose MRSA infection nor to guide or monitor treatment for MRSA infections.  Blood culture (routine x 2)     Status: None   Collection Time: 01/09/14  7:49 PM  Result Value Ref Range Status   Specimen Description BLOOD ARM RIGHT  Final   Special Requests BOTTLES DRAWN AEROBIC AND ANAEROBIC 5CC  Final   Culture  Setup Time   Final    01/10/2014 01:07 Performed at [facility]   Culture   Final    NO GROWTH 5 DAYS Performed at [facility]   Report Status 01/16/2014 FINAL  Final  Blood culture (routine x 2)      Status: None   Collection Time: 01/09/14  8:12 PM  Result Value Ref Range Status   Specimen Description BLOOD HAND RIGHT  Final   Special Requests BOTTLES DRAWN AEROBIC AND ANAEROBIC 5CC  Final   Culture  Setup Time   Final    01/10/2014 01:08 Performed at [facility]   Culture   Final    NO GROWTH 5 DAYS Performed at [facility]   Report Status 01/16/2014 FINAL  Final  Urine culture     Status: None   Collection Time: 01/09/14  9:37 PM  Result Value Ref Range Status   Specimen Description URINE, CATHETERIZED  Final   Special Requests ADDED [id number]  Final   Culture  Setup Time   Final    01/10/2014 08:53 Performed at [place] Performed at [facility]  Final   Culture NO GROWTH Performed at [facility]  Final   Report Status 01/11/2014 FINAL  Final  MRSA PCR Screening     Status: Abnormal   Collection Time: 01/10/14  7:01 AM  Result Value Ref Range Status   MRSA by PCR POSITIVE (A) NEGATIVE Final    Comment:        The GeneXpert MRSA Assay (FDA approved for NASAL specimens only), is one component of a comprehensive MRSA colonization surveillance program. It is not intended to diagnose MRSA infection nor to guide or monitor treatment for MRSA infections. CRITICAL RESULT CALLED TO, READ BACK BY AND VERIFIED WITH: KSOR Q RN 01/10/14 0922 COSTELLO B  Clostridium Difficile by PCR     Status: Abnormal   Collection Time: 01/10/14 10:00 AM  Result Value Ref Range Status   C difficile by pcr POSITIVE (A) NEGATIVE Final    Comment: CRITICAL RESULT CALLED TO, READ BACK BY AND VERIFIED WITH: Gladstone Pih RN 12:40 01/10/14 ([email or web address])    Medical History: Past Medical History  Diagnosis Date  . Quadriplegia   . Depression   . Anemia   . Parkinson's disease   . Seizures   . Adrenal insufficiency   . Dysphagia 06/19/2013  . Aphasia 06/19/2013    Medications:  Prescriptions prior to admission   Medication Sig Dispense Refill Last Dose  . azelastine (OPTIVAR) 0.05 % ophthalmic solution Place 1 drop into both eyes every 12 (twelve) hours.   unknown  . baclofen (LIORESAL) 10 MG tablet 10 mg by PEG Tube route every 8 (eight) hours.    unknown  . ferrous  sulfate 300 (60 FE) MG/5ML syrup 300 mg by PEG Tube route daily.   unknown  . fludrocortisone (FLORINEF) 0.1 MG tablet 0.1 mg by PEG Tube route daily. Adrenocortical Insufficiency   unknown  . folic acid (FOLVITE) 1 MG tablet 1 mg by PEG Tube route daily.    unknown  . levETIRAcetam (KEPPRA) 500 MG/5ML injection Inject 500 mg into the vein every 12 (twelve) hours.   unknown  . Loratadine 5 MG TBDP 5 mg by PEG Tube route daily.   unknown  . metronidazole (FLAGYL) 500-0.74 MG/100ML-% Inject 500 mg into the vein every 12 (twelve) hours.   unknown  . norepinephrine 4 mg in dextrose 5 % 250 mL Inject 0-40 mcg/min into the vein. 12.5 mcg/minute   unknown  . phenytoin (DILANTIN) 125 MG/5ML suspension 125 mg by PEG Tube route every 12 (twelve) hours.    unknown  . polyvinyl alcohol (LIQUIFILM TEARS) 1.4 % ophthalmic solution Place 1 drop into both eyes daily.    unknown  . potassium chloride SA (K-DUR,KLOR-CON) 20 MEQ tablet Take 2 tablets (40 mEq total) by mouth daily. (Patient taking differently: 40 mEq by PEG Tube route daily. )   unknown  . predniSONE (DELTASONE) 10 MG tablet Place 1 tablet (10 mg total) into feeding tube daily. (Patient taking differently: 10 mg by PEG Tube route daily. )     . PRESCRIPTION MEDICATION Inject 500 mg into the vein every 6 (six) hours. Imipenem 500 mg IV   unknown  . PRESCRIPTION MEDICATION 1 tablet by PEG Tube route daily. Lactobacillus acidophilus   unknown  . vancomycin (VANCOCIN) 50 mg/mL oral solution Place 10 mLs (500 mg total) into feeding tube every 6 (six) hours. (Patient taking differently: 500 mg by PEG Tube route every 6 (six) hours. )   unknown  . vancomycin 1,500 mg in sodium chloride 0.9 % 500 mL  Inject 1,500 mg into the vein every 12 (twelve) hours.   unknown  . Water For Irrigation, Sterile (FREE WATER) SOLN Place 200 mLs into feeding tube every 8 (eight) hours. (Patient taking differently: 200 mLs by PEG Tube route every 8 (eight) hours. )   unknown  . Amino Acids-Protein Hydrolys (FEEDING SUPPLEMENT, PRO-STAT SUGAR FREE 64,) LIQD Place 30 mLs into feeding tube 3 (three) times daily.   Past Week at Unknown time   Assessment: 58 yo M with quadriplegia and dense encephalopathy admitted with septic shock after recent hospitalizaion with C diff infection. Pharmacy consulted to adjust abx doses for renal function.  Vanc trough this am 31.2 mg/L  CULTURES:  Urine (ordered/pending 10/31): -  Blood (ordered/pending 10/31): -  Sputum (ordered/pending 10/31): -   ANTIBIOTICS:  PO Vanc (had been on at [facility]): 10/31-  PO Flagyl: 10/31-  IV Vanc: 10/31 -  IV Cefepime: 10/31 -   Goal of Therapy:  Vancomycin trough level 15-20 mcg/ml  Plan:  -Cont cefepime 1 gm IV q8h -Cont flagyl 500 mg per tube q8h -Cont oral vancomycin 125 mg per tube q6h -Will recheck vanc level with am labs and hold scheduled IV vanc for now -trend WBC, temp, Scr  Thanks for allowing pharmacy to be a part of this patient's care.  Excell Seltzer, PharmD Clinical Pharmacist, [phone] 01/27/2014 7:30 AM

## 2014-01-27 NOTE — Progress Notes (Signed)
CRITICAL VALUE ALERT  Critical value received:  Blood culture; gram negative bacteria  Date of notification:  01/27/2014  Time of notification: 10:40am  Critical value read back:Yes.    Nurse who received alert:  Therese Sarah, RN  MD notified (1st page):  Dr. Lake Bells  Time of first page: 10:40am  MD notified (2nd page):  Time of second page:  Responding MD:  Dr. Lake Bells  Time MD responded: 10:40

## 2014-01-27 NOTE — Progress Notes (Signed)
[facility] Progress Note Patient Name: [name] DOB: 1955/09/09 MRN: [id number]   Date of Service  01/27/2014  HPI/Events of Note  n/v  eICU Interventions  zofran     Intervention Category Minor Interventions: Routine modifications to care plan (e.g. PRN medications for pain, fever)  Raylene Miyamoto. 01/27/2014, 12:49 AM

## 2014-01-27 NOTE — Progress Notes (Signed)
INITIAL NUTRITION ASSESSMENT  DOCUMENTATION CODES Per approved criteria  -Not Applicable   INTERVENTION:  Recommend continuing to increase by 10 ml every 4 hours to goal rate of 55 ml/hr via PEG.   30 ml Prostat TID.    Tube feeding regimen provides 1950 kcal, 114 grams of protein, and 922 ml of H2O.  Total free water: 1522 ml   NUTRITION DIAGNOSIS: Inadequate oral intake related to inability to eat as evidenced by NPO status.    Goal: Pt to meet >/= 90% of their estimated nutrition needs   Monitor:  TF initiation and tolerance, weight trends, labs  Reason for Assessment: MD consult - assessment and TF recommendations  58 y.o. male  Admitting Dx: Sepsis  ASSESSMENT: Pt with history of quadriplegia, dysphagia status post peg tube placement, aphasia, depression, seizure disorder, adrenal insufficiency, Parkinson's disease and resides at nursing home. TF regimen is TwoCal HN at 55 ml/hr for 20 hours. This provides 2200 kcal, and 92 gram protein Pt well known from previous admissions who was admitted with septic shock after recent hospitalization with c.diff.   Pt with vomiting this am, anti-emetic requested and TF held. Per notes pt with high residuals (110 ml only documented residual) Pt resumed TF at 10 ml/hr.  Pt on ferrous sulfate, folic acid, flagyl, and florastor  CBG's: 127-160 Magnesium: 1.4  Free water: 200 ml every 8 hours = 600 ml  Nutrition-focused physical exam not done due to quad status.   Height: Ht Readings from Last 1 Encounters:  01/25/14 6\' 2"  (1.88 m)    Weight: Wt Readings from Last 1 Encounters:  01/26/14 167 lb 5.3 oz (75.9 kg)    Ideal Body Weight: 77.7 kg   % Ideal Body Weight: 98%  Wt Readings from Last 10 Encounters:  01/26/14 167 lb 5.3 oz (75.9 kg)  01/17/14 174 lb 13.2 oz (79.3 kg)  01/02/14 155 lb 6.8 oz (70.5 kg)  12/11/13 155 lb 6.4 oz (70.489 kg)  10/30/13 156 lb 3.2 oz (70.852 kg)  07/23/13 156 lb (70.761 kg)  06/24/13  159 lb 9.6 oz (72.394 kg)  05/27/13 158 lb 9.6 oz (71.94 kg)  06/15/12 150 lb 2.1 oz (68.1 kg)  06/13/12 150 lb (68.04 kg)    Usual Body Weight: 155 lb   % Usual Body Weight: >100%  BMI:  Body mass index is 21.47 kg/(m^2).  Estimated Nutritional Needs: Kcal: 1900-2100  Protein: 105-115 gram  Fluid: >/=1900 ml/daily  Skin: wound on sacrum per RN, not documented yet as pt has dressing on.   Diet Order: Diet NPO time specified  EDUCATION NEEDS: -No education needs identified at this time   Intake/Output Summary (Last 24 hours) at 01/27/14 0930 Last data filed at 01/27/14 0600  Gross per 24 hour  Intake 1619.17 ml  Output    800 ml  Net 819.17 ml    Last BM: via rectal pouch 11/1: 200 ml  Labs:   Recent Labs Lab 01/25/14 2225  NA 139  K 3.8  CL 109  CO2 18*  BUN 17  CREATININE 1.20  CALCIUM 7.1*  MG 1.4*  PHOS 2.5  GLUCOSE 113*    CBG (last 3)   Recent Labs  01/26/14 1119 01/26/14 1548 01/26/14 2034  GLUCAP 160* 127* 138*    Scheduled Meds: . baclofen  10 mg Per Tube TID  . ceFEPime (MAXIPIME) IV  1 g Intravenous 3 times per day  . feeding supplement (PRO-STAT SUGAR FREE 64)  30 mL  Per Tube TID  . ferrous sulfate  300 mg Per Tube Daily  . folic acid  1 mg Oral Daily  . free water  200 mL Per Tube 3 times per day  . hydrocortisone sod succinate (SOLU-CORTEF) inj  50 mg Intravenous Q6H  . loratadine  10 mg Oral Daily  . metroNIDAZOLE  500 mg Per Tube 3 times per day  . olopatadine  1 drop Both Eyes q morning - 10a  . phenytoin  125 mg Per Tube BID  . polyvinyl alcohol  1 drop Both Eyes Daily  . saccharomyces boulardii  250 mg Oral BID  . vancomycin  125 mg Per Tube 4 times per day    Continuous Infusions: . feeding supplement (OSMOLITE 1.5 CAL) 10 mL/hr (01/27/14 0345)  . norepinephrine (LEVOPHED) Adult infusion Stopped (01/26/14 1200)    Past Medical History  Diagnosis Date  . Quadriplegia   . Depression   . Anemia   .  Parkinson's disease   . Seizures   . Adrenal insufficiency   . Dysphagia 06/19/2013  . Aphasia 06/19/2013    Past Surgical History  Procedure Laterality Date  . Peg tube placement    . Peripherally inserted central catheter insertion      [place], LDN, [place] Pager [phone] After Hours Pager

## 2014-01-28 ENCOUNTER — Other Ambulatory Visit (HOSPITAL_COMMUNITY): Payer: Medicare Other

## 2014-01-28 ENCOUNTER — Inpatient Hospital Stay
Admission: RE | Admit: 2014-01-28 | Discharge: 2014-02-18 | Disposition: A | Discharge status: Skilled Nursing Facility | Payer: Medicare Other | Source: Other Acute Inpatient Hospital | Attending: Internal Medicine | Admitting: Internal Medicine

## 2014-01-28 DIAGNOSIS — R6889 Other general symptoms and signs: Secondary | ICD-10-CM

## 2014-01-28 DIAGNOSIS — J188 Other pneumonia, unspecified organism: Secondary | ICD-10-CM | POA: Diagnosis not present

## 2014-01-28 DIAGNOSIS — R0602 Shortness of breath: Secondary | ICD-10-CM

## 2014-01-28 DIAGNOSIS — J811 Chronic pulmonary edema: Secondary | ICD-10-CM | POA: Diagnosis not present

## 2014-01-28 DIAGNOSIS — J918 Pleural effusion in other conditions classified elsewhere: Secondary | ICD-10-CM | POA: Diagnosis not present

## 2014-01-28 DIAGNOSIS — Z6822 Body mass index (BMI) 22.0-22.9, adult: Secondary | ICD-10-CM | POA: Diagnosis not present

## 2014-01-28 DIAGNOSIS — R918 Other nonspecific abnormal finding of lung field: Secondary | ICD-10-CM | POA: Diagnosis not present

## 2014-01-28 DIAGNOSIS — D649 Anemia, unspecified: Secondary | ICD-10-CM | POA: Diagnosis present

## 2014-01-28 DIAGNOSIS — Z4682 Encounter for fitting and adjustment of non-vascular catheter: Secondary | ICD-10-CM | POA: Diagnosis not present

## 2014-01-28 DIAGNOSIS — G20 Parkinson's disease: Secondary | ICD-10-CM | POA: Diagnosis present

## 2014-01-28 DIAGNOSIS — E2740 Unspecified adrenocortical insufficiency: Secondary | ICD-10-CM | POA: Diagnosis present

## 2014-01-28 DIAGNOSIS — N390 Urinary tract infection, site not specified: Secondary | ICD-10-CM | POA: Diagnosis not present

## 2014-01-28 DIAGNOSIS — E870 Hyperosmolality and hypernatremia: Secondary | ICD-10-CM | POA: Diagnosis present

## 2014-01-28 DIAGNOSIS — B372 Candidiasis of skin and nail: Secondary | ICD-10-CM | POA: Diagnosis not present

## 2014-01-28 DIAGNOSIS — D638 Anemia in other chronic diseases classified elsewhere: Secondary | ICD-10-CM | POA: Diagnosis present

## 2014-01-28 DIAGNOSIS — J9690 Respiratory failure, unspecified, unspecified whether with hypoxia or hypercapnia: Secondary | ICD-10-CM | POA: Diagnosis not present

## 2014-01-28 DIAGNOSIS — R6520 Severe sepsis without septic shock: Secondary | ICD-10-CM | POA: Diagnosis not present

## 2014-01-28 DIAGNOSIS — Z431 Encounter for attention to gastrostomy: Secondary | ICD-10-CM | POA: Diagnosis not present

## 2014-01-28 DIAGNOSIS — G9340 Encephalopathy, unspecified: Secondary | ICD-10-CM | POA: Diagnosis not present

## 2014-01-28 DIAGNOSIS — L89152 Pressure ulcer of sacral region, stage 2: Secondary | ICD-10-CM | POA: Diagnosis present

## 2014-01-28 DIAGNOSIS — J90 Pleural effusion, not elsewhere classified: Secondary | ICD-10-CM | POA: Diagnosis not present

## 2014-01-28 DIAGNOSIS — G894 Chronic pain syndrome: Secondary | ICD-10-CM | POA: Diagnosis present

## 2014-01-28 DIAGNOSIS — G40909 Epilepsy, unspecified, not intractable, without status epilepticus: Secondary | ICD-10-CM | POA: Diagnosis present

## 2014-01-28 DIAGNOSIS — E876 Hypokalemia: Secondary | ICD-10-CM | POA: Diagnosis not present

## 2014-01-28 DIAGNOSIS — A4150 Gram-negative sepsis, unspecified: Secondary | ICD-10-CM | POA: Diagnosis present

## 2014-01-28 DIAGNOSIS — B999 Unspecified infectious disease: Secondary | ICD-10-CM | POA: Diagnosis not present

## 2014-01-28 DIAGNOSIS — Z4659 Encounter for fitting and adjustment of other gastrointestinal appliance and device: Secondary | ICD-10-CM

## 2014-01-28 DIAGNOSIS — R531 Weakness: Secondary | ICD-10-CM | POA: Diagnosis not present

## 2014-01-28 DIAGNOSIS — R1310 Dysphagia, unspecified: Secondary | ICD-10-CM | POA: Diagnosis present

## 2014-01-28 DIAGNOSIS — B9689 Other specified bacterial agents as the cause of diseases classified elsewhere: Secondary | ICD-10-CM | POA: Diagnosis present

## 2014-01-28 DIAGNOSIS — J309 Allergic rhinitis, unspecified: Secondary | ICD-10-CM | POA: Diagnosis not present

## 2014-01-28 DIAGNOSIS — J690 Pneumonitis due to inhalation of food and vomit: Secondary | ICD-10-CM | POA: Diagnosis not present

## 2014-01-28 DIAGNOSIS — I509 Heart failure, unspecified: Secondary | ICD-10-CM | POA: Diagnosis not present

## 2014-01-28 DIAGNOSIS — Z931 Gastrostomy status: Secondary | ICD-10-CM

## 2014-01-28 DIAGNOSIS — A419 Sepsis, unspecified organism: Secondary | ICD-10-CM | POA: Diagnosis not present

## 2014-01-28 DIAGNOSIS — J9601 Acute respiratory failure with hypoxia: Secondary | ICD-10-CM | POA: Diagnosis not present

## 2014-01-28 DIAGNOSIS — E119 Type 2 diabetes mellitus without complications: Secondary | ICD-10-CM | POA: Diagnosis present

## 2014-01-28 DIAGNOSIS — A047 Enterocolitis due to Clostridium difficile: Secondary | ICD-10-CM | POA: Diagnosis not present

## 2014-01-28 DIAGNOSIS — J189 Pneumonia, unspecified organism: Secondary | ICD-10-CM

## 2014-01-28 DIAGNOSIS — G8250 Quadriplegia, unspecified: Secondary | ICD-10-CM | POA: Diagnosis present

## 2014-01-28 DIAGNOSIS — E440 Moderate protein-calorie malnutrition: Secondary | ICD-10-CM | POA: Diagnosis present

## 2014-01-28 DIAGNOSIS — J9600 Acute respiratory failure, unspecified whether with hypoxia or hypercapnia: Secondary | ICD-10-CM | POA: Diagnosis not present

## 2014-01-28 DIAGNOSIS — K9422 Gastrostomy infection: Secondary | ICD-10-CM | POA: Diagnosis not present

## 2014-01-28 LAB — CBC WITH DIFFERENTIAL/PLATELET
BASOS PCT: 0 % (ref 0–1)
Basophils Absolute: 0 10*3/uL (ref 0.0–0.1)
Eosinophils Absolute: 0 10*3/uL (ref 0.0–0.7)
Eosinophils Relative: 0 % (ref 0–5)
HCT: 25 % — ABNORMAL LOW (ref 39.0–52.0)
HEMOGLOBIN: 8.1 g/dL — AB (ref 13.0–17.0)
Lymphocytes Relative: 12 % (ref 12–46)
Lymphs Abs: 1.2 10*3/uL (ref 0.7–4.0)
MCH: 23.5 pg — AB (ref 26.0–34.0)
MCHC: 32.4 g/dL (ref 30.0–36.0)
MCV: 72.7 fL — AB (ref 78.0–100.0)
MONO ABS: 0.3 10*3/uL (ref 0.1–1.0)
MONOS PCT: 3 % (ref 3–12)
NEUTROS PCT: 85 % — AB (ref 43–77)
Neutro Abs: 8.2 10*3/uL — ABNORMAL HIGH (ref 1.7–7.7)
Platelets: 129 10*3/uL — ABNORMAL LOW (ref 150–400)
RBC: 3.44 MIL/uL — ABNORMAL LOW (ref 4.22–5.81)
RDW: 14.8 % (ref 11.5–15.5)
WBC: 9.7 10*3/uL (ref 4.0–10.5)

## 2014-01-28 LAB — URINE CULTURE
CULTURE: NO GROWTH
Colony Count: NO GROWTH

## 2014-01-28 LAB — BASIC METABOLIC PANEL
Anion gap: 12 (ref 5–15)
BUN: 20 mg/dL (ref 6–23)
CHLORIDE: 111 meq/L (ref 96–112)
CO2: 25 mEq/L (ref 19–32)
Calcium: 8.1 mg/dL — ABNORMAL LOW (ref 8.4–10.5)
Creatinine, Ser: 0.57 mg/dL (ref 0.50–1.35)
GFR calc Af Amer: >90 mL/min (ref >90)
GFR calc non Af Amer: >90 mL/min (ref >90)
GLUCOSE: 113 mg/dL — AB (ref 70–99)
POTASSIUM: 3.3 meq/L — AB (ref 3.7–5.3)
SODIUM: 148 meq/L — AB (ref 137–147)

## 2014-01-28 LAB — VANCOMYCIN, RANDOM: Vancomycin Rm: 12.9 ug/mL

## 2014-01-28 MED ORDER — DEXTROSE-NACL 5-0.45 % IV SOLN: 100.0000 mL/h | INTRAVENOUS | Status: DC

## 2014-01-28 MED ORDER — ONDANSETRON HCL 4 MG/2ML IJ SOLN: 4.0000 mg | Freq: Four times a day (QID) | INTRAMUSCULAR | Status: DC | PRN

## 2014-01-28 MED ORDER — DEXTROSE 5 % IV SOLN: 1.0000 g | Freq: Three times a day (TID) | INTRAVENOUS | Status: AC

## 2014-01-28 MED ORDER — SACCHAROMYCES BOULARDII 250 MG PO CAPS: 250.0000 mg | ORAL_CAPSULE | Freq: Two times a day (BID) | ORAL | Status: DC

## 2014-01-28 MED ORDER — BACLOFEN 10 MG PO TABS: 10.0000 mg | ORAL_TABLET | Freq: Three times a day (TID) | ORAL | Status: DC

## 2014-01-28 MED ORDER — POLYVINYL ALCOHOL 1.4 % OP SOLN: 1.0000 [drp] | Freq: Every day | OPHTHALMIC | Status: DC

## 2014-01-28 MED ORDER — METRONIDAZOLE 50 MG/ML ORAL SUSPENSION: 500.0000 mg | Freq: Three times a day (TID) | ORAL | Status: DC

## 2014-01-28 MED ORDER — PHENYTOIN 125 MG/5ML PO SUSP: 125.0000 mg | Freq: Two times a day (BID) | ORAL | Status: DC

## 2014-01-28 MED ORDER — FREE WATER: 250.0000 mL | Freq: Four times a day (QID) | Status: DC

## 2014-01-28 MED ORDER — OLOPATADINE HCL 0.1 % OP SOLN: 1.0000 [drp] | Freq: Every morning | OPHTHALMIC | Status: DC

## 2014-01-28 MED ORDER — VANCOMYCIN HCL 10 G IV SOLR
1250.0000 mg | INTRAVENOUS | Status: DC
  Filled 2014-01-28: qty 1250

## 2014-01-28 MED ORDER — PRO-STAT SUGAR FREE PO LIQD: 30.0000 mL | Freq: Three times a day (TID) | ORAL | Status: DC

## 2014-01-28 MED ORDER — DEXTROSE-NACL 5-0.45 % IV SOLN
INTRAVENOUS | Status: DC
  Administered 2014-01-28: 100 mL/h via INTRAVENOUS

## 2014-01-28 MED ORDER — FERROUS SULFATE 300 (60 FE) MG/5ML PO SYRP: 300.0000 mg | ORAL_SOLUTION | Freq: Every day | ORAL | Status: DC

## 2014-01-28 MED ORDER — FREE WATER
250.0000 mL | Freq: Four times a day (QID) | Status: DC
  Administered 2014-01-28: 250 mL

## 2014-01-28 MED ORDER — IOHEXOL 300 MG/ML  SOLN
50.0000 mL | Freq: Once | INTRAMUSCULAR | Status: AC | PRN
  Administered 2014-01-28: 35 mL via ORAL

## 2014-01-28 MED ORDER — VANCOMYCIN 50 MG/ML ORAL SOLUTION: 125.0000 mg | Freq: Four times a day (QID) | ORAL | Status: DC

## 2014-01-28 MED ORDER — OSMOLITE 1.5 CAL PO LIQD: 1000.0000 mL | ORAL | Status: DC

## 2014-01-28 MED ORDER — HYDROCORTISONE NA SUCCINATE PF 100 MG IJ SOLR: 50.0000 mg | Freq: Two times a day (BID) | INTRAMUSCULAR | Status: DC

## 2014-01-28 NOTE — Progress Notes (Deleted)
Patient Demographics  [name], is a 58 y.o. male, DOB - February 11, 1956, ZOX:[id number]  Admit date - 01/25/2014   Admitting Physician Nelda Bucks, MD  Outpatient Primary MD for the patient is [name], MD  LOS - 3   No chief complaint on file.     [name] is a 58 y/o man with a history of neurological injury in 2004 (non-traumatic, possibly related to alcohol abuse vs central pontine myelinolysis as family reported that his injury was from "brain swelling" during hospitalization for dehydration) that has left with dense quadriplegia, seizures, adrenal insuffiencey, HTN, DM, and recent subdural hematoma of unclear etiology. Patient presents from [facility] on 10/31 with septic shock, hypotensive, according procedure,  So he was admitted to [facility] service, he has been treated for C. Difficile at [facility], 2 sisters who've thought to be secondary to to gram-negative rods bacteremia and UTI, patient is off pressors, so transferred to [facility] on 11/3  Subjective:   [name] nonverbal , no events overnight.  Assessment & Plan    Active Problems:   Acute encephalopathy   Subdural hematoma   Sepsis   Septic shock  Sepsis -Septic physiology has resolved, and febrile, no leukocytosis. -In the setting of C. Difficile colitis, gram-negative bacteremia, UTI  Gram-negative rods bacteremia -PICC line was discontinued 11/2. -Patient is on cefepime 10/31 - repeat blood cultures  UTI -Continue with IV cefepime  C. Difficile colitis -Continue with by mouth vancomycin and Flagyl  History of subdural hematoma -Hold chemical anticoagulation due to history of recent CNS bleed  Diabetes mellitus -Continue with insulin sliding scale  Acute on chronic encephalopathy -Mentation continues to improve -metabolic encephalopathy  secondary to sepsis          Code Status: Full  Family Communication: spoke with brother over the phone  Disposition Plan: remains as inpatient, brother want him to be transferred to [facility] for the time being   Procedures  11/1 RUQ U/S> cholelithiasis 11/1 CT head > improved chronic SDU 11/1 CT abdomen> improved bowel distension, stable pelvic stranding L sided PICC Placed prior to admission d/c 11/2  Consults   Admitted initially under pulmonary critical care service  Medications  Scheduled Meds: . baclofen  10 mg Per Tube TID  . ceFEPime (MAXIPIME) IV  1 g Intravenous 3 times per day  . feeding supplement (PRO-STAT SUGAR FREE 64)  30 mL Per Tube TID  . ferrous sulfate  300 mg Per Tube Daily  . folic acid  1 mg Oral Daily  . free water  250 mL Per Tube Q6H  . hydrocortisone sod succinate (SOLU-CORTEF) inj  50 mg Intravenous Q12H  . loratadine  10 mg Oral Daily  . metroNIDAZOLE  500 mg Per Tube 3 times per day  . olopatadine  1 drop Both Eyes q morning - 10a  . phenytoin  125 mg Per Tube BID  . polyvinyl alcohol  1 drop Both Eyes Daily  . saccharomyces boulardii  250 mg Oral BID  . vancomycin  125 mg Per Tube 4 times per day   Continuous Infusions: . dextrose 5 % and 0.45% NaCl    . feeding supplement (OSMOLITE 1.5 CAL) 1,000 mL (01/28/14 0128)  PRN Meds:.sodium chloride, Place/Maintain arterial line **AND** sodium chloride, Place/Maintain arterial line **AND** sodium chloride, ondansetron (ZOFRAN) IV, traMADol, white petrolatum  DVT Prophylaxis   SCDs   Lab Results  Component Value Date   PLT 129* 01/28/2014    Antibiotics    PO Vanc (had been on at [facility]): 10/31- PO Flagyl: 10/31- IV Vanc: 10/31 - IV Cefepime: 10/31 -   Anti-infectives    Start     Dose/Rate Route Frequency Ordered Stop   01/28/14 1000  vancomycin (VANCOCIN) 1,250 mg in sodium chloride 0.9 % 250 mL IVPB  Status:  Discontinued     1,250 mg166.7 mL/hr over 90 Minutes Intravenous  Every 24 hours 01/28/14 0851 01/28/14 0852   01/26/14 0600  ceFEPIme (MAXIPIME) 1 g in dextrose 5 % 50 mL IVPB     1 g100 mL/hr over 30 Minutes Intravenous 3 times per day 01/25/14 2154     01/26/14 0000  vancomycin (VANCOCIN) 50 mg/mL oral solution 125 mg     125 mg Per Tube 4 times per day 01/25/14 2129     01/25/14 2200  vancomycin (VANCOCIN) 1,250 mg in sodium chloride 0.9 % 250 mL IVPB  Status:  Discontinued     1,250 mg166.7 mL/hr over 90 Minutes Intravenous 3 times per day 01/25/14 2129 01/27/14 0738   01/25/14 2200  ceFEPIme (MAXIPIME) 2 g in dextrose 5 % 50 mL IVPB     2 g100 mL/hr over 30 Minutes Intravenous Every 12 hours 01/25/14 2129 01/25/14 2330   01/25/14 2200  metroNIDAZOLE (FLAGYL) 50 mg/ml oral suspension 500 mg     500 mg Per Tube 3 times per day 01/25/14 2129            Objective:   Filed Vitals:   01/27/14 1542 01/27/14 2121 01/28/14 0510 01/28/14 1050  BP: 113/70 103/69 123/69 120/64  Pulse: 84 82 75 83  Temp: 98.4 F (36.9 C) 98 F (36.7 C) 98.1 F (36.7 C) 98.6 F (37 C)  TempSrc: Oral  Oral Oral  Resp: 21 19 18 20   Height:      Weight:  75.901 kg (167 lb 5.3 oz)    SpO2: 100% 99% 100% 100%    Wt Readings from Last 3 Encounters:  01/27/14 75.901 kg (167 lb 5.3 oz)  01/17/14 79.3 kg (174 lb 13.2 oz)  01/02/14 70.5 kg (155 lb 6.8 oz)     Intake/Output Summary (Last 24 hours) at 01/28/14 1359 Last data filed at 01/28/14 0516  Gross per 24 hour  Intake     10 ml  Output    695 ml  Net   -685 ml     Physical Exam  Gen: groans, no verbal response, awake HEENT: NCAT OP clear PULM: CTA B CV: RRR, no mgr AB: BS+, binder in place, PEG Ext: contractures, mild chronic edema Neuro: Awake, groans, no verbal response   Data Review   Micro Results Recent Results (from the past 240 hour(s))  Culture, blood (routine x 2)     Status: None (Preliminary result)   Collection Time: 01/25/14 10:25 PM  Result Value Ref Range Status   Specimen  Description BLOOD LEFT PICC LINE  Final   Special Requests BOTTLES DRAWN AEROBIC AND ANAEROBIC 5CC EA  Final   Culture  Setup Time   Final    01/26/2014 04:11 Performed at [facility]    Culture   Final    GRAM NEGATIVE RODS Note: Gram Stain Report Called to,Read  Back By and Verified With: DEVON S. @ 9:00AM 11.2.15 BY DESIS Performed at [facility]    Report Status PENDING  Incomplete  Culture, blood (routine x 2)     Status: None (Preliminary result)   Collection Time: 01/25/14 10:37 PM  Result Value Ref Range Status   Specimen Description BLOOD RIGHT HAND  Final   Special Requests BOTTLES DRAWN AEROBIC ONLY 2CC  Final   Culture  Setup Time   Final    01/26/2014 04:11 Performed at [facility]    Culture   Final           BLOOD CULTURE RECEIVED NO GROWTH TO DATE CULTURE WILL BE HELD FOR 5 DAYS BEFORE ISSUING A FINAL NEGATIVE REPORT Performed at [facility]    Report Status PENDING  Incomplete  Urine culture     Status: None   Collection Time: 01/25/14 11:32 PM  Result Value Ref Range Status   Specimen Description URINE, CATHETERIZED  Final   Special Requests NONE  Final   Culture  Setup Time   Final    01/26/2014 20:20 Performed at [facility]    Colony Count NO GROWTH Performed at [facility]   Final   Culture NO GROWTH Performed at [facility]   Final   Report Status 01/28/2014 FINAL  Final    Radiology Reports No results found.  CBC  Recent Labs Lab 01/25/14 2225 01/27/14 1003 01/28/14 0723  WBC 14.4* 20.5* 9.7  HGB 7.9* 7.6* 8.1*  HCT 24.4* 23.2* 25.0*  PLT 154 127* 129*  MCV 73.5* 71.8* 72.7*  MCH 23.8* 23.5* 23.5*  MCHC 32.4 32.8 32.4  RDW 14.6 14.7 14.8  LYMPHSABS 1.7  --  1.2  MONOABS 0.9  --  0.3  EOSABS 0.0  --  0.0  BASOSABS 0.0  --  0.0    Chemistries   Recent Labs Lab 01/25/14 2225 01/27/14 1003 01/28/14 0723  NA 139 147 148*  K 3.8 3.2* 3.3*  CL 109 113* 111  CO2  18* 23 25  GLUCOSE 113* 132* 113*  BUN 17 17 20   CREATININE 1.20 0.59 0.57  CALCIUM 7.1* 8.1* 8.1*  MG 1.4*  --   --   AST 78*  --   --   ALT 28  --   --   ALKPHOS 104  --   --   BILITOT 0.5  --   --    ------------------------------------------------------------------------------------------------------------------ estimated creatinine clearance is 109.4 mL/min (by C-G formula based on Cr of 0.57). ------------------------------------------------------------------------------------------------------------------ No results for input(s): HGBA1C in the last 72 hours. ------------------------------------------------------------------------------------------------------------------ No results for input(s): CHOL, HDL, LDLCALC, TRIG, CHOLHDL, LDLDIRECT in the last 72 hours. ------------------------------------------------------------------------------------------------------------------ No results for input(s): TSH, T4TOTAL, T3FREE, THYROIDAB in the last 72 hours.  Invalid input(s): FREET3 ------------------------------------------------------------------------------------------------------------------ No results for input(s): VITAMINB12, FOLATE, FERRITIN, TIBC, IRON, RETICCTPCT in the last 72 hours.  Coagulation profile  Recent Labs Lab 01/25/14 2225  INR 2.17*    No results for input(s): DDIMER in the last 72 hours.  Cardiac Enzymes  Recent Labs Lab 01/25/14 2225 01/26/14 0250 01/26/14 1020  TROPONINI <0.30 <0.30 <0.30   ------------------------------------------------------------------------------------------------------------------ Invalid input(s): POCBNP     Time Spent in minutes   30 minutes   Sherre Wooton M.D on 01/28/2014 at 1:59 PM  Between 7am to 7pm - Pager - [phone]  After 7pm go to [email or web address] - password [email or web address]  And look for the night coverage person covering for me  after hours  [facility] Office   [phone]   **Disclaimer: This note may have been dictated with voice recognition software. Similar sounding words can inadvertently be transcribed and this note may contain transcription errors which may not have been corrected upon publication of note.**

## 2014-01-28 NOTE — Discharge Instructions (Signed)
To continue care as per sitting facility physician recommendation.

## 2014-01-28 NOTE — Discharge Summary (Signed)
[name], 58 y.o., DOB 01-Jan-1956, MRN [id number]. Admission date: 01/25/2014 Discharge Date 01/28/2014 Primary MD [name] Sleeper, MD Admitting Physician [name] Bucks, MD  Admission Diagnosis  SEIZURE  Discharge Diagnosis   Active Problems:   Acute encephalopathy   Subdural hematoma   Sepsis   Septic shock   Past Medical History  Diagnosis Date  . Quadriplegia   . Depression   . Anemia   . Parkinson's disease   . Seizures   . Adrenal insufficiency   . Dysphagia 06/19/2013  . Aphasia 06/19/2013    Past Surgical History  Procedure Laterality Date  . Peg tube placement    . Peripherally inserted central catheter insertion     Admission history of present illness/brief narrative:   [name] is a 58 y/o man with a history of neurological injury in 2004 (non-traumatic, possibly related to alcohol abuse vs central pontine myelinolysis as family reported that his injury was from "brain swelling" during hospitalization for dehydration) that has left with dense quadriplegia, seizures, adrenal insuffiencey, HTN, DM, and recent subdural hematoma of unclear etiology. He was admitted 01/02/14 with lethargy and a head CT at that time showed a large subdural hematoma with both an acute and a chronic component to it, including 1.4 cm of midline shift. Neurosurgery was consulted and reccommended conservative management with close follow-up imaging. A follow up head CT 24 and 96 hours later demonstrated stability of the hematoma, and return of his mental status to baseline. He was re-admitted 10/16 again with increasing lethargy but also with fever, and he was found to have C. Diff colitis (he had been treated for Proteus infection during his prior hospitalization). He was discharged 10/23 on PO flagyl to complete a 14-day course. He did have two other CT scans of his head which did demonstrate stability of his sub-dural. He was discharged to [facility], and per his family, he  was in his usual state of health 48 hours ago, but when family came to visit today, pt was altered. He apparently was febrile and hypotensive, so a PICC was placed Patient presents from [facility] on 10/31 with septic shock, hypotensive, according procedure, So he was admitted to [facility] service, he has been treated for C. Difficile at [facility], 2 sisters who've thought to be secondary to to gram-negative rods bacteremia and UTI, patient is off pressors, so transferred to [facility] on 11/3, is [facility] to finish total of 14 days of IV cefepime,  Hospital Course See H&P, Labs, Consult and Test reports for all details in brief, patient was admitted for **  Active Problems:   Acute encephalopathy   Subdural hematoma   Sepsis   Septic shock  Sepsis -Septic physiology has resolved, and febrile, no leukocytosis. -In the setting of C. Difficile colitis, gram-negative bacteremia, UTI  Gram-negative rods bacteremia -PICC line was discontinued 11/2. -Patient is on cefepime 10/31, will need to finish total of 14 days. - repeat blood cultures  UTI -Continue with IV cefepime  C. Difficile colitis -Continue with by mouth vancomycin and Flagyl  History of subdural hematoma -Hold chemical anticoagulation due to history of recent CNS bleed  Diabetes mellitus -Continue with insulin sliding scale  Acute on chronic encephalopathy -Mentation continues to improve -metabolic encephalopathy secondary to sepsis   hyponatremia -secondary to volume depletion, start D5 half-normal saline at 75 mL per hour, and increase water flushes to 250 every 6 hours.   Significant Tests:  See full reports  for all details    Ct Abdomen Pelvis Wo Contrast  01/26/2014   CLINICAL DATA:  Elevated white blood cell count, abdominal distention and agitation.  EXAM: CT ABDOMEN AND PELVIS WITHOUT CONTRAST  TECHNIQUE: Multidetector CT imaging of the abdomen and pelvis was performed  following the standard protocol without IV contrast.  COMPARISON:  01/09/2014  FINDINGS: Visualized lung bases show bibasilar atelectasis and trace amount of pleural fluid bilaterally.  Unenhanced appearance of the liver, gallbladder, pancreas, spleen, adrenal glands and kidneys are unremarkable. Bowel shows no evidence of obstruction or significant ileus. No free intraperitoneal air identified. In the pelvis, degree of thickening noted of the sigmoid colon and rectum on the prior exam appears improved with a rectal tube now present. There remains stable soft tissue stranding in the presacral region without focal abscess.  No ascites identified. Stable appearance of balloon retention gastrostomy tube in the lumen of the stomach. The bladder is decompressed by a Foley catheter. No hernias, masses or enlarged lymph nodes seen. Bony structures show osteopenia and Stable mild compression fractures of the T11, T12 and L5 vertebral bodies.  IMPRESSION: 1. Bibasilar atelectasis with new trace bilateral pleural effusions. 2. Improved thickening of the sigmoid colon and rectum with rectal decompression tube now present. No evidence of acute bowel obstruction. 3. Stable soft tissue stranding in the presacral space without focal abscess.   Electronically Signed   By: [name] M.D.   On: 01/26/2014 09:53   Dg Chest 2 View  01/09/2014   CLINICAL DATA:  Fever. Recent diagnosis of urinary tract infection. Pain.  EXAM: CHEST  2 VIEW  COMPARISON:  01/05/2014  FINDINGS: No cardiomegaly. Unchanged aortic contours, distorted by rightward rotation.  There is chronic diffuse interstitial coarsening without pneumonia or overt edema. No effusion or pneumothorax. Stable biapical extrapleural thickening. 23 mm long triangular density overlapping the left mid chest is not definitively seen laterally and is presumably external to the patient.  IMPRESSION: Diffuse interstitial coarsening which could be congestive or bronchitic.    Electronically Signed   By: [name] M.D.   On: 01/09/2014 21:15   Ct Head Wo Contrast  01/26/2014   CLINICAL DATA:  Seizure.  Subdural hematoma  EXAM: CT HEAD WITHOUT CONTRAST  TECHNIQUE: Contiguous axial images were obtained from the base of the skull through the vertex without intravenous contrast.  COMPARISON:  CT 01/13/2014  FINDINGS: This study is significantly degraded by motion. Two attempts were made however both are degraded by significant motion.  Left-sided subdural hematoma is predominant low-density with several small areas of intermediate density. This is smaller compared with the prior study. There is less mass-effect on the left cerebral hemisphere. No new hemorrhage is present since the prior study.  13 mm midline shift to the right appears slightly improved. Negative for hydrocephalus. Left posterior fossa subdural hygroma unchanged.  Generalized atrophy. No acute infarct. Chronic ischemia in the basal ganglia external capsule bilaterally.  IMPRESSION: Left-sided subdural hematoma is mildly improved in size without interval acute hemorrhage. Improvement in mass effect and midline shift since the prior CT.   Electronically Signed   By: Marlan Palau M.D.   On: 01/26/2014 09:55   Ct Head Wo Contrast  01/13/2014   CLINICAL DATA:  Follow-up LEFT subdural hematoma. History of seizure and adrenal insufficiency.  EXAM: CT HEAD WITHOUT CONTRAST  TECHNIQUE: Contiguous axial images were obtained from the base of the skull through the vertex without intravenous contrast.  COMPARISON:  CT of the  head January 09, 2014  FINDINGS: Similar to slightly decreased heterogeneous LEFT holohemispheric subdural hematoma. 12 mm LEFT-to-RIGHT midline shift with partial effacement LEFT lateral ventricle. No hydrocephalus or CT findings of entrapment. No rebleed. No intraparenchymal hemorrhage. No acute large vascular territory infarct. Small remote bilateral basal ganglia lacunar infarcts. Moderate  generalized global parenchymal brain volume loss for age.  Prominent LEFT posterior fossa extra-axial fluid collection may reflect remote subdural hematoma, unchanged. Basal cisterns are patent.  Mild paranasal sinus mucosal thickening without air-fluid levels. Mastoid air cells are well aerated. No skull fracture.  IMPRESSION: Similar to slightly decreased size of LEFT hole atmospheric acute on chronic subdural hematoma cauda 12 mm LEFT-to-RIGHT midline shift, decreased. No CT findings is hydrocephalus/entrapment.  Remote bilateral basal ganglia lacunar infarcts.  Global parenchymal brain volume loss, advanced for age.   Electronically Signed   By: Awilda Metro   On: 01/13/2014 05:16   Ct Head Wo Contrast  01/10/2014   CLINICAL DATA:  History of nontraumatic subdural hematoma. Increasing pain. Initial encounter.  EXAM: CT HEAD WITHOUT CONTRAST  TECHNIQUE: Contiguous axial images were obtained from the base of the skull through the vertex without intravenous contrast.  COMPARISON:  01/06/2014  FINDINGS: Skull and Sinuses:Negative for fracture or destructive process. The mastoids, middle ears, and imaged paranasal sinuses are clear.  Orbits: No acute abnormality.  Brain: There is a mixed density subdural collection around the left cerebral convexity which is increased from before. The collection is predominantly isodense to hypodense, compatible with proteinaceous fluid. There are also thin high-density areas that could represent recent hemorrhage or septations. The scalloped appearance of the collection superiorly is also consistent with loculation. Rightward midline shift is increased, currently 15 mm previously 10 mm. There is marked subfalcine herniation and early left uncal herniation which was also present previously. No evidence of ACA infarct or ventricular entrapment.  Remote bilateral putamen infarct consistent with toxic/metabolic insult (seen with methanol poisoning, global anoxic injury, extra  pontine myelinolysis and other insults). The brain is globally atrophic. No evidence of acute infarct or hydrocephalus.  Critical Value/emergent results were called by telephone at the time of interpretation on 01/10/2014 at 12:17 am to Dr. Denton Lank, who verbally acknowledged these results.  IMPRESSION: 1. Increased mixed density subdural hematoma around the left cerebral convexity. Midline shift has increased to 15 mm, with associated herniations described above. 2. Atrophy and chronic putaminal necrosis, as above.   Electronically Signed   By: [name] M.D.   On: 01/10/2014 00:24   Ct Head Wo Contrast  01/06/2014   CLINICAL DATA:  58 year old male with left subdural hematoma. Initial encounter.  EXAM: CT HEAD WITHOUT CONTRAST  TECHNIQUE: Contiguous axial images were obtained from the base of the skull through the vertex without intravenous contrast.  COMPARISON:  01/03/2014 and earlier.  FINDINGS: No acute osseous abnormality identified. No acute orbit or scalp soft tissue findings. Stable minor paranasal sinus mucosal thickening. Calcified atherosclerosis at the skull base.  Mixed density left subdural hematoma persists measuring up to 12-13 mm in thickness at the level of the operculum, and has not significantly changed since 01/02/2014.  Suggestion of a much smaller right vertex subdural hematoma measuring up to 5 mm (series 2, image 22). This was not evident on earlier exams, but is low density.  Rightward midline shift of 10 mm is stable. Stable ventricles. Basilar cisterns remain patent. No intra-axial or intraventricular hemorrhage. Stable gray-white matter differentiation throughout the brain. Age advanced cerebral volume loss. Age  advanced bilateral external capsule hypodensity. No evidence of cortically based acute infarction identified.  IMPRESSION: 1. Mixed density left subdural hematoma is stable since 01/02/2014, measuring up to 12 to 13 mm thickness at the level of the operculum. 2.  Questionable small or trace right side subdural at the vertex, 5 mm. 3. Mass effect is stable, with rightward midline shift of 10 mm.   Electronically Signed   By: Augusto Gamble M.D.   On: 01/06/2014 11:08   Ct Head Wo Contrast  01/03/2014   CLINICAL DATA:  Followup subdural hematoma.  EXAM: CT HEAD WITHOUT CONTRAST  TECHNIQUE: Contiguous axial images were obtained from the base of the skull through the vertex without intravenous contrast.  COMPARISON:  Prior CT from 01/02/2014  FINDINGS: Previously identified acute on chronic left subdural hematoma again seen. This collection is not significantly changed in size measuring up to 1.3 cm in transverse diameter at the level of the left frontal lobe. Again, this measures 3 cm at the vertex. The acute blood components are slightly different in location, but overall likely not significantly changed in volume with no definite new bleeding. Left-to-right midline shift at the level of the septum pellucidum measures approximately 1.1 cm, likely not significantly changed. Ventricle size is stable without hydrocephalus.  Parenchymal volume loss with small vessel disease again noted. Remote ischemic infarcts within the bilateral external capsules again noted. No acute large vessel territory infarct.  Calvarium and scalp soft tissues within normal limits. No acute abnormality seen about either orbit. Paranasal sinuses and mastoid air cells are clear.  IMPRESSION: 1. Stable size of acute on chronic left subdural hematoma measuring up to 1.3 cm in thickness with similar left-to-right midline shift of 1.1 cm. 2. No new intracranial abnormality.   Electronically Signed   By: Rise Mu M.D.   On: 01/03/2014 07:28   Ct Head Wo Contrast  01/02/2014   CLINICAL DATA:  Acute onset of fever. Acute encephalopathy. Personal history of seizures.  EXAM: CT HEAD WITHOUT CONTRAST  TECHNIQUE: Contiguous axial images were obtained from the base of the skull through the vertex without  intravenous contrast.  COMPARISON:  CT of the head performed 04/27/2004  FINDINGS: There is a large left-sided acute on chronic subdural hematoma. This measures approximately 1.3 cm in thickness at the level of the lateral ventricles. At the vertex, it measures up to 3 cm, though this reflects the plane of imaging. Scattered foci of acute blood are noted within the hematoma. There is marked associated mass effect, with approximately 1.4 cm of rightward midline shift.  Underlying moderate cortical volume loss is noted. Cerebellar atrophy is seen. Scattered periventricular white matter change likely reflects small vessel ischemic microangiopathy. Chronic ischemic change is noted with regard to the external capsule bilaterally.  The brainstem and fourth ventricle are within normal limits.  There is no evidence of fracture; visualized osseous structures are unremarkable in appearance. The visualized portions of the orbits are within normal limits. The paranasal sinuses and mastoid air cells are well-aerated. No significant soft tissue abnormalities are seen.  IMPRESSION: 1. Large left-sided acute on chronic subdural hematoma, measuring 1.3 cm in thickness at the level of the lateral ventricles. It measures up to 3 cm at the vertex, though this reflects the plane of imaging. Scattered foci of acute blood noted within the hematoma. Marked associated mass effect, with 1.4 cm of rightward midline shift. 2. Moderate cortical volume loss and scattered small vessel ischemic microangiopathy. Chronic ischemic change noted with  regard to the external capsule bilaterally.  Critical Value/emergent results were called by telephone at the time of interpretation on 01/02/2014 at 3:57 am to Dr. Midge Minium, who verbally acknowledged these results.   Electronically Signed   By: Roanna Raider M.D.   On: 01/02/2014 04:00   Ct Lumbar Spine Wo Contrast  01/02/2014   CLINICAL DATA:  Back pain.  No injury.  History of quadriplegia.   EXAM: CT LUMBAR SPINE WITHOUT CONTRAST  TECHNIQUE: Multidetector CT imaging of the lumbar spine was performed without intravenous contrast administration. Multiplanar CT image reconstructions were also generated.  COMPARISON:  Prior CT from 06/22/2013  FINDINGS: Vertebral bodies are normally aligned with preservation of the normal lumbar lordosis. Osteopenia noted.  Chronic anterior height loss at the superior endplate of T12 with associated degenerative endplate Schmorl's node is stable from prior study. Minimal height loss at T11 also unchanged. There is increased central height loss at the superior endplate of L5 as compared to prior CT from 06/22/2013. No discrete fracture line is seen. Otherwise, vertebral body heights are preserved.  Vertebral bodies are normally aligned with preservation of the normal lumbar lordosis.  The conus medullaris terminates at a normal level, and is grossly unremarkable.  Fatty atrophy noted within the paraspinous musculature. No acute soft tissue abnormality.  At T10-11 through L1-2, there is no disc bulge or disc protrusion. No significant canal or foraminal stenosis.  L2-3: There is mild disc bulge with bilateral facet hypertrophy. No focal disc protrusion. No canal or foraminal stenosis.  L3-4: There is mild diffuse disc bulge with bilateral facet hypertrophy and ligamentum flavum thickening. No significant canal or foraminal stenosis. No focal disc protrusion.  L4-5: There is diffuse disc bulging. There is a superimposed right foraminal disc protrusion without definite nerve root impingement (series 3, image 109). Bilateral facet hypertrophy with ligamentum flavum thickening present. There is resultant mild canal stenosis. Mild bilateral foraminal narrowing.  L5-S1 mild bilateral facet hypertrophy with mild diffuse disc bulge. No canal or foraminal stenosis.  IMPRESSION: 1. Increased central height loss at the superior endplate of L5 as compared to most recent CT from  11/13/2013. No definite discrete fracture line identified. Correlation with MRI could be performed if there is clinical concern for possible occult compression fracture at this level. 2. Stable anterior wedging of the T11 and T12 vertebral bodies. 3. Degenerative disc bulge with superimposed right foraminal disc protrusion at L4-5 with resultant mild canal bilateral foraminal stenosis. 4. Additional mild degenerative changes as above.   Electronically Signed   By: Rise Mu M.D.   On: 01/02/2014 04:24   Ct Abdomen Pelvis W Contrast  01/10/2014   CLINICAL DATA:  Abdominal tenderness, pain and fever.  EXAM: CT ABDOMEN AND PELVIS WITH CONTRAST  TECHNIQUE: Multidetector CT imaging of the abdomen and pelvis was performed using the standard protocol following bolus administration of intravenous contrast.  CONTRAST:  OMNIPAQUE IOHEXOL 300 MG/ML  SOLN  COMPARISON:  CT abdomen pelvis - 06/15/2012; lumbar spine CT -01/02/2014  FINDINGS: Normal hepatic contour. No discrete hepatic lesions. Normal appearance of the gallbladder. No radiopaque gallstones. No intra extrahepatic biliary duct dilatation. No ascites.  There is symmetric enhancement and excretion of the bilateral kidneys. Note is made of an approximately 2.5 cm hypo attenuating (-8 Hounsfield unit) partially exophytic left-sided renal cyst. No discrete right-sided renal lesions. No definite renal stones on this postcontrast examination. No urinary obstruction or perinephric stranding.  Normal appearance of the bilateral adrenal glands, pancreas  and spleen.  Enteric contrast extends to the level of the proximal ascending colon. A gastrostomy tube balloon is appropriately insufflated within the mid body of the stomach. There is diffuse wall thickening involving the distal sigmoid colon (image 72, series 21), extending to the level of the rectum. The sigmoid colon is noted to be redundant but otherwise normal in appearance. Normal appearance of the  appendix. No pneumoperitoneum, pneumatosis or portal venous gas.  Scattered mixed calcified and noncalcified atherosclerotic plaque with a normal caliber abdominal aorta. The major branch vessels of the abdominal aorta appear widely patent on this non CTA examination though exuberant calcification is noted involving the origin of the solitary right renal artery.  No retroperitoneal, mesenteric pelvic or inguinal lymphadenopathy. No retroperitoneal, mesenteric  Limited visualization of the lower thorax demonstrates minimal dependent ground-glass atelectasis. No discrete focal airspace opacities. No pleural effusion.  Normal heart size. No pericardial effusion. Coronary artery calcifications.  No acute or aggressive osseus abnormalities. Grossly unchanged moderate (approximately 30%) compression deformity involving the L5 vertebral body, and mild (under 25%) compression deformity involving the superior endplates of the T11 and T12 vertebral bodies.  IMPRESSION: 1. Diffuse wall thickening involving the distal sigmoid colon extending through the level of the rectum, the etiology of which is not depicted as examination with differential considerations including infectious, inflammatory and (less likely) ischemic etiologies. These findings are associated with adjacent mesenteric stranding but without evidence of perforation or definable/drainable fluid collection. No enteric obstruction. 2. Appropriately positioned gastrostomy tube. 3. Grossly unchanged moderate compression deformity involving the L5 vertebral body and mild compression deformities involving the T11 and T12 vertebral bodies. Correlation with report from lumbar spine CT performed 01/02/2014 is recommended.   Electronically Signed   By: Simonne Come M.D.   On: 01/10/2014 00:20   Dg Chest Port 1 View  01/25/2014   CLINICAL DATA:  Sepsis.  EXAM: PORTABLE CHEST - 1 VIEW  COMPARISON:  01/10/2014  FINDINGS: There is a left arm PICC line with tip in the  projection of the expected location of the left innominate vein. Heart size is normal. Lung volumes are low and there is asymmetric elevation of the right hemidiaphragm. Small pleural effusions and mild interstitial edema is noted.  IMPRESSION: The left arm PICC line tip is in the expected location of the left innominate vein.  Pulmonary edema and small effusions.   Electronically Signed   By: Signa Kell M.D.   On: 01/25/2014 23:56   Dg Chest Port 1 View  01/10/2014   CLINICAL DATA:  Respiratory failure with hypoxia.  EXAM: PORTABLE CHEST - 1 VIEW  COMPARISON:  January 09, 2014.  FINDINGS: Stable cardiomediastinal silhouette. No pneumothorax is noted. Stable chronic diffuse interstitial coarsening is noted. Mild left basilar opacity is noted concerning for subsegmental atelectasis or pneumonia. Possible mild associated pleural effusion may be present. Bony thorax appears intact.  IMPRESSION: Mild left basilar opacity is noted concerning for pneumonia or subsegmental atelectasis, with possible associated pleural effusion. Follow-up radiographs are recommended.   Electronically Signed   By: Roque Lias M.D.   On: 01/10/2014 13:20   Dg Chest Port 1 View  01/05/2014   CLINICAL DATA:  Persistent cough  EXAM: PORTABLE CHEST - 1 VIEW  COMPARISON:  January 02, 2014 and June 20, 2013  FINDINGS: There is diffuse interstitial prominence without airspace consolidation. Heart is upper normal in size with pulmonary vascularity within normal limits. No adenopathy. No bone lesions.  IMPRESSION: Persistent diffuse interstitial prominence.  Suspect a degree of chronic congestive heart failure. No consolidation. Apparent stable compared to recent prior study.   Electronically Signed   By: Bretta Bang M.D.   On: 01/05/2014 09:48   Dg Chest Port 1 View  01/02/2014   CLINICAL DATA:  Fever beginning yesterday. Shortness of breath and wheezing. Initial encounter.  EXAM: PORTABLE CHEST - 1 VIEW  COMPARISON:  One-view  chest 06/20/2013.  FINDINGS: The patient is rotated to the right. The heart size is upper limits of normal. This is exaggerated by low lung volumes. Right peritracheal soft tissue prominence is likely vascular and projectional. Chronic interstitial coarsening is evident. The right hemidiaphragm is further elevated with asymmetric subsegmental atelectasis at the right base.  IMPRESSION: 1. Low lung volumes with new elevation of the right hemidiaphragm. 2. Bibasilar subsegmental atelectasis, right greater than left. 3. Chronic interstitial coarsening.   Electronically Signed   By: Gennette Pac M.D.   On: 01/02/2014 00:36   Dg Abd Portable 1v  01/05/2014   CLINICAL DATA:  Fecal impaction in rectum  EXAM: PORTABLE ABDOMEN - 1 VIEW  COMPARISON:  01/04/2014  FINDINGS: A gastric feeding tube again noted. There is nonspecific nonobstructive bowel gas pattern. There is some residual stool within rectum with improvement from prior exam. Rectum measures 5 cm in diameter. Persistent some gaseous distension of sigmoid colon.  IMPRESSION: Nonobstructive small bowel bowel gas pattern. Residual stool within rectum with improvement from prior exam. Rectum measures about 5 cm in diameter. Persistent mild gaseous distension of sigmoid colon.   Electronically Signed   By: Natasha Mead M.D.   On: 01/05/2014 10:31   Dg Abd Portable 1v  01/04/2014   CLINICAL DATA:  Initial encounter for abdominal distention.  EXAM: PORTABLE ABDOMEN - 1 VIEW  COMPARISON:  One-view abdomen 01/02/2014.  FINDINGS: A gastrostomy tube is stable in appearance. Gas is present within the stomach. A distended loop of bowel is noted in the left lower quadrant. A moderate amount of stool is again seen within the rectum. The bowel gas pattern is otherwise unremarkable. The visualized soft tissues and bony thorax are unremarkable.  IMPRESSION: 1. Distended loop of bowel in the left lower quadrant appears to be colon. This may represent a partial obstruction.  Moderate stool is present in the rectum. This could be secondary to impaction. 2. The more proximal bowel is unremarkable. 3. Stable positioning of the gastrostomy tube.   Electronically Signed   By: Gennette Pac M.D.   On: 01/04/2014 09:23   Dg Abd Portable 1v  01/02/2014   CLINICAL DATA:  Gastrostomy tube.  Initial evaluation.  EXAM: PORTABLE ABDOMEN - 1 VIEW  COMPARISON:  CT 11/13/2013.  FINDINGS: What appears to be a gastrostomy tube is noted projected over left upper quadrant. Stomach is not distended. No significant bowel distention. Stool in rectum. Calcified pelvic densities consistent with phleboliths.  IMPRESSION: What appears to be a gastrostomy tube is noted projected over left upper quadrant over the stomach. No gastric distention.   Electronically Signed   By: Maisie Fus  Register   On: 01/02/2014 10:44   US Abdomen Limited Ruq  01/26/2014   CLINICAL DATA:  Sepsis  EXAM: US ABDOMEN LIMITED - RIGHT UPPER QUADRANT  COMPARISON:  CT abdomen and pelvis January 26, 2014  FINDINGS: Gallbladder:  Within the gallbladder, there are multiple echogenic foci which move and shadow consistent with gallstones. Largest gallstone measures 6 mm in length. There is no gallbladder wall thickening or pericholecystic fluid. No  sonographic Murphy sign noted.  Common bile duct:  Diameter: 4 mm. There is no intrahepatic or extrahepatic biliary duct dilatation.  Liver:  No focal lesion identified. Within normal limits in parenchymal echogenicity.  IMPRESSION: Cholelithiasis.   Electronically Signed   By: Bretta Bang M.D.   On: 01/26/2014 14:55     Today   Subjective:   Omran Nohr nonverbal , no events overnight.  Objective:   Blood pressure 120/64, pulse 83, temperature 98.6 F (37 C), temperature source Oral, resp. rate 20, height 6\' 2"  (1.88 m), weight 75.901 kg (167 lb 5.3 oz), SpO2 100 %.  Intake/Output Summary (Last 24 hours) at 01/28/14 1645 Last data filed at 01/28/14 0516  Gross per 24  hour  Intake      0 ml  Output    400 ml  Net   -400 ml    Exam Gen: groans, no verbal response, awake HEENT: NCAT OP clear PULM: CTA B CV: RRR, no mgr AB: BS+, binder in place, PEG Ext: contractures, mild chronic edema Neuro: Awake, groans, no verbal response  Data Review   Cultures -  Results for orders placed or performed during the hospital encounter of 01/25/14  Culture, blood (routine x 2)     Status: None (Preliminary result)   Collection Time: 01/25/14 10:25 PM  Result Value Ref Range Status   Specimen Description BLOOD LEFT PICC LINE  Final   Special Requests BOTTLES DRAWN AEROBIC AND ANAEROBIC 5CC EA  Final   Culture  Setup Time   Final    01/26/2014 04:11 Performed at [facility]    Culture   Final    GRAM NEGATIVE RODS Note: Gram Stain Report Called to,Read Back By and Verified With: DEVON S. @ 9:00AM 11.2.15 BY DESIS Performed at [facility]    Report Status PENDING  Incomplete  Culture, blood (routine x 2)     Status: None (Preliminary result)   Collection Time: 01/25/14 10:37 PM  Result Value Ref Range Status   Specimen Description BLOOD RIGHT HAND  Final   Special Requests BOTTLES DRAWN AEROBIC ONLY 2CC  Final   Culture  Setup Time   Final    01/26/2014 04:11 Performed at [facility]    Culture   Final           BLOOD CULTURE RECEIVED NO GROWTH TO DATE CULTURE WILL BE HELD FOR 5 DAYS BEFORE ISSUING A FINAL NEGATIVE REPORT Performed at [facility]    Report Status PENDING  Incomplete  Urine culture     Status: None   Collection Time: 01/25/14 11:32 PM  Result Value Ref Range Status   Specimen Description URINE, CATHETERIZED  Final   Special Requests NONE  Final   Culture  Setup Time   Final    01/26/2014 20:20 Performed at [facility]    Colony Count NO GROWTH Performed at [facility]   Final   Culture NO GROWTH Performed at [facility]   Final   Report Status 01/28/2014 FINAL   Final     CBC w Diff: Lab Results  Component Value Date   WBC 9.7 01/28/2014   WBC 7.0 09/24/2013   HGB 8.1* 01/28/2014   HCT 25.0* 01/28/2014   PLT 129* 01/28/2014   LYMPHOPCT 12 01/28/2014   BANDSPCT 9 11/20/2010   MONOPCT 3 01/28/2014   EOSPCT 0 01/28/2014   BASOPCT 0 01/28/2014   CMP: Lab Results  Component Value Date  NA 148* 01/28/2014   NA 134* 09/24/2013   K 3.3* 01/28/2014   CL 111 01/28/2014   CO2 25 01/28/2014   BUN 20 01/28/2014   BUN 18 09/24/2013   CREATININE 0.57 01/28/2014   CREATININE 0.5* 09/24/2013   GLU 96 09/24/2013   PROT 6.3 01/25/2014   ALBUMIN 2.0* 01/25/2014   BILITOT 0.5 01/25/2014   ALKPHOS 104 01/25/2014   AST 78* 01/25/2014   ALT 28 01/25/2014  .  Micro Results Recent Results (from the past 240 hour(s))  Culture, blood (routine x 2)     Status: None (Preliminary result)   Collection Time: 01/25/14 10:25 PM  Result Value Ref Range Status   Specimen Description BLOOD LEFT PICC LINE  Final   Special Requests BOTTLES DRAWN AEROBIC AND ANAEROBIC 5CC EA  Final   Culture  Setup Time   Final    01/26/2014 04:11 Performed at [facility]    Culture   Final    GRAM NEGATIVE RODS Note: Gram Stain Report Called to,Read Back By and Verified With: DEVON S. @ 9:00AM 11.2.15 BY DESIS Performed at [facility]    Report Status PENDING  Incomplete  Culture, blood (routine x 2)     Status: None (Preliminary result)   Collection Time: 01/25/14 10:37 PM  Result Value Ref Range Status   Specimen Description BLOOD RIGHT HAND  Final   Special Requests BOTTLES DRAWN AEROBIC ONLY 2CC  Final   Culture  Setup Time   Final    01/26/2014 04:11 Performed at [facility]    Culture   Final           BLOOD CULTURE RECEIVED NO GROWTH TO DATE CULTURE WILL BE HELD FOR 5 DAYS BEFORE ISSUING A FINAL NEGATIVE REPORT Performed at [facility]    Report Status PENDING  Incomplete  Urine culture     Status: None   Collection  Time: 01/25/14 11:32 PM  Result Value Ref Range Status   Specimen Description URINE, CATHETERIZED  Final   Special Requests NONE  Final   Culture  Setup Time   Final    01/26/2014 20:20 Performed at [facility]    Colony Count NO GROWTH Performed at [facility]   Final   Culture NO GROWTH Performed at [facility]   Final   Report Status 01/28/2014 FINAL  Final     Discharge Instructions     Continue with IV cefepime total of 14 days end date 11/14   Discharge Medications     Medication List    STOP taking these medications        azelastine 0.05 % ophthalmic solution  Commonly known as:  OPTIVAR     cetirizine 5 MG tablet  Commonly known as:  ZYRTEC     fludrocortisone 0.1 MG tablet  Commonly known as:  FLORINEF     folic acid 1 MG tablet  Commonly known as:  FOLVITE     KEPPRA 500 MG/5ML injection  Generic drug:  levETIRAcetam     Loratadine 5 MG Tbdp     metronidazole 500-0.74 MG/100ML-%  Commonly known as:  FLAGYL     norepinephrine 4 mg in dextrose 5 % 250 mL     potassium chloride SA 20 MEQ tablet  Commonly known as:  K-DUR,KLOR-CON     predniSONE 10 MG tablet  Commonly known as:  DELTASONE     PRESCRIPTION MEDICATION     PRESCRIPTION MEDICATION  traMADol 50 MG tablet  Commonly known as:  ULTRAM     vancomycin 1,500 mg in sodium chloride 0.9 % 500 mL     white petrolatum Gel  Commonly known as:  VASELINE      TAKE these medications        baclofen 10 MG tablet  Commonly known as:  LIORESAL  Place 1 tablet (10 mg total) into feeding tube 3 (three) times daily.     ceFEPIme 1 g in dextrose 5 % 50 mL  Inject 1 g into the vein every 8 (eight) hours.     dextrose 5 % and 0.45% NaCl infusion  Inject 100 mL/hr into the vein continuous.     feeding supplement (OSMOLITE 1.5 CAL) Liqd  Place 1,000 mLs into feeding tube continuous.     feeding supplement (PRO-STAT SUGAR FREE 64) Liqd  Place 30 mLs into  feeding tube 3 (three) times daily.     ferrous sulfate 300 (60 FE) MG/5ML syrup  Place 5 mLs (300 mg total) into feeding tube daily.     free water Soln  Place 250 mLs into feeding tube every 6 (six) hours.     hydrocortisone sodium succinate 100 MG Solr injection  Commonly known as:  SOLU-CORTEF  Inject 1 mL (50 mg total) into the vein every 12 (twelve) hours.     metroNIDAZOLE 50 mg/ml oral suspension  Commonly known as:  FLAGYL  Place 10 mLs (500 mg total) into feeding tube every 8 (eight) hours.     olopatadine 0.1 % ophthalmic solution  Commonly known as:  PATANOL  Place 1 drop into both eyes every morning.     ondansetron 4 MG/2ML Soln injection  Commonly known as:  ZOFRAN  Inject 2 mLs (4 mg total) into the vein every 6 (six) hours as needed for nausea or vomiting.     phenytoin 125 MG/5ML suspension  Commonly known as:  DILANTIN  Place 5 mLs (125 mg total) into feeding tube 2 (two) times daily.     polyvinyl alcohol 1.4 % ophthalmic solution  Commonly known as:  LIQUIFILM TEARS  Place 1 drop into both eyes daily.     saccharomyces boulardii 250 MG capsule  Commonly known as:  FLORASTOR  Take 1 capsule (250 mg total) by mouth 2 (two) times daily.     vancomycin 50 mg/mL oral solution  Commonly known as:  VANCOCIN  Place 2.5 mLs (125 mg total) into feeding tube every 6 (six) hours.         Total Time in preparing paper work, data evaluation and todays exam - 35 minutes  Aedan Geimer M.D on 01/28/2014 at 4:45 PM  [facility] Office  [phone]

## 2014-01-29 ENCOUNTER — Other Ambulatory Visit (HOSPITAL_COMMUNITY): Payer: Medicare Other

## 2014-01-29 DIAGNOSIS — J811 Chronic pulmonary edema: Secondary | ICD-10-CM | POA: Diagnosis not present

## 2014-01-29 DIAGNOSIS — J918 Pleural effusion in other conditions classified elsewhere: Secondary | ICD-10-CM | POA: Diagnosis not present

## 2014-01-29 LAB — COMPREHENSIVE METABOLIC PANEL
ALBUMIN: 2.4 g/dL — AB (ref 3.5–5.2)
ALK PHOS: 122 U/L — AB (ref 39–117)
ALT: 25 U/L (ref 0–53)
AST: 56 U/L — ABNORMAL HIGH (ref 0–37)
Anion gap: 11 (ref 5–15)
BUN: 16 mg/dL (ref 6–23)
CO2: 28 mEq/L (ref 19–32)
CREATININE: 0.47 mg/dL — AB (ref 0.50–1.35)
Calcium: 8.1 mg/dL — ABNORMAL LOW (ref 8.4–10.5)
Chloride: 109 mEq/L (ref 96–112)
GFR calc Af Amer: >90 mL/min (ref >90)
GFR calc non Af Amer: >90 mL/min (ref >90)
Glucose, Bld: 85 mg/dL (ref 70–99)
Potassium: 3.6 mEq/L — ABNORMAL LOW (ref 3.7–5.3)
Sodium: 148 mEq/L — ABNORMAL HIGH (ref 137–147)
TOTAL PROTEIN: 7.1 g/dL (ref 6.0–8.3)
Total Bilirubin: 0.4 mg/dL (ref 0.3–1.2)

## 2014-01-29 LAB — CBC
HCT: 26.6 % — ABNORMAL LOW (ref 39.0–52.0)
HEMOGLOBIN: 8.5 g/dL — AB (ref 13.0–17.0)
MCH: 23.7 pg — AB (ref 26.0–34.0)
MCHC: 32 g/dL (ref 30.0–36.0)
MCV: 74.3 fL — AB (ref 78.0–100.0)
Platelets: 121 10*3/uL — ABNORMAL LOW (ref 150–400)
RBC: 3.58 MIL/uL — ABNORMAL LOW (ref 4.22–5.81)
RDW: 14.8 % (ref 11.5–15.5)
WBC: 6.3 10*3/uL (ref 4.0–10.5)

## 2014-01-29 LAB — CULTURE, BLOOD (ROUTINE X 2)

## 2014-01-29 LAB — GLUCOSE, CAPILLARY: GLUCOSE-CAPILLARY: 118 mg/dL — AB (ref 70–99)

## 2014-01-29 LAB — TSH: TSH: 2.79 u[IU]/mL (ref 0.350–4.500)

## 2014-01-29 LAB — SEDIMENTATION RATE: Sed Rate: 40 mm/hr — ABNORMAL HIGH (ref 0–16)

## 2014-01-30 LAB — IRON AND TIBC
IRON: 48 ug/dL (ref 42–135)
SATURATION RATIOS: 38 % (ref 20–55)
TIBC: 125 ug/dL — AB (ref 215–435)
UIBC: 77 ug/dL — ABNORMAL LOW (ref 125–400)

## 2014-01-30 LAB — PHENYTOIN LEVEL, TOTAL: Phenytoin Lvl: 10.4 ug/mL (ref 10.0–20.0)

## 2014-01-30 LAB — C-REACTIVE PROTEIN: CRP: 4 mg/dL — ABNORMAL HIGH (ref <0.60)

## 2014-01-30 LAB — FOLATE RBC: RBC Folate: 1291 ng/mL — ABNORMAL HIGH (ref >280)

## 2014-01-30 LAB — VITAMIN B12: Vitamin B-12: 905 pg/mL (ref 211–911)

## 2014-02-01 ENCOUNTER — Other Ambulatory Visit (HOSPITAL_COMMUNITY): Payer: Medicare Other

## 2014-02-01 DIAGNOSIS — J811 Chronic pulmonary edema: Secondary | ICD-10-CM | POA: Diagnosis not present

## 2014-02-01 DIAGNOSIS — I509 Heart failure, unspecified: Secondary | ICD-10-CM | POA: Diagnosis not present

## 2014-02-01 DIAGNOSIS — J90 Pleural effusion, not elsewhere classified: Secondary | ICD-10-CM | POA: Diagnosis not present

## 2014-02-02 LAB — CBC
HEMATOCRIT: 27.8 % — AB (ref 39.0–52.0)
Hemoglobin: 9.1 g/dL — ABNORMAL LOW (ref 13.0–17.0)
MCH: 23.8 pg — ABNORMAL LOW (ref 26.0–34.0)
MCHC: 32.7 g/dL (ref 30.0–36.0)
MCV: 72.8 fL — AB (ref 78.0–100.0)
Platelets: 292 10*3/uL (ref 150–400)
RBC: 3.82 MIL/uL — AB (ref 4.22–5.81)
RDW: 14.5 % (ref 11.5–15.5)
WBC: 7.2 10*3/uL (ref 4.0–10.5)

## 2014-02-02 LAB — COMPREHENSIVE METABOLIC PANEL
ALBUMIN: 2.4 g/dL — AB (ref 3.5–5.2)
ALK PHOS: 136 U/L — AB (ref 39–117)
ALT: 14 U/L (ref 0–53)
ANION GAP: 9 (ref 5–15)
AST: 34 U/L (ref 0–37)
BUN: 9 mg/dL (ref 6–23)
CHLORIDE: 87 meq/L — AB (ref 96–112)
CO2: 40 mEq/L (ref 19–32)
Calcium: 8 mg/dL — ABNORMAL LOW (ref 8.4–10.5)
Creatinine, Ser: 0.46 mg/dL — ABNORMAL LOW (ref 0.50–1.35)
GFR calc Af Amer: >90 mL/min (ref >90)
GFR calc non Af Amer: >90 mL/min (ref >90)
Glucose, Bld: 101 mg/dL — ABNORMAL HIGH (ref 70–99)
Potassium: 2.7 mEq/L — CL (ref 3.7–5.3)
Sodium: 136 mEq/L — ABNORMAL LOW (ref 137–147)
Total Bilirubin: 0.2 mg/dL — ABNORMAL LOW (ref 0.3–1.2)
Total Protein: 7.1 g/dL (ref 6.0–8.3)

## 2014-02-03 LAB — CULTURE, BLOOD (ROUTINE X 2): Culture: NO GROWTH

## 2014-02-03 LAB — POTASSIUM: Potassium: 3.4 mEq/L — ABNORMAL LOW (ref 3.7–5.3)

## 2014-02-04 ENCOUNTER — Other Ambulatory Visit (HOSPITAL_COMMUNITY): Payer: Medicare Other

## 2014-02-04 DIAGNOSIS — J9690 Respiratory failure, unspecified, unspecified whether with hypoxia or hypercapnia: Secondary | ICD-10-CM | POA: Diagnosis not present

## 2014-02-04 DIAGNOSIS — J188 Other pneumonia, unspecified organism: Secondary | ICD-10-CM | POA: Diagnosis not present

## 2014-02-04 DIAGNOSIS — J811 Chronic pulmonary edema: Secondary | ICD-10-CM | POA: Diagnosis not present

## 2014-02-04 LAB — CBC
HCT: 25.5 % — ABNORMAL LOW (ref 39.0–52.0)
HEMOGLOBIN: 8.2 g/dL — AB (ref 13.0–17.0)
MCH: 24.2 pg — ABNORMAL LOW (ref 26.0–34.0)
MCHC: 32.2 g/dL (ref 30.0–36.0)
MCV: 75.2 fL — AB (ref 78.0–100.0)
Platelets: 329 10*3/uL (ref 150–400)
RBC: 3.39 MIL/uL — AB (ref 4.22–5.81)
RDW: 15.1 % (ref 11.5–15.5)
WBC: 6 10*3/uL (ref 4.0–10.5)

## 2014-02-04 LAB — POTASSIUM: Potassium: 3.7 mEq/L (ref 3.7–5.3)

## 2014-02-06 LAB — BASIC METABOLIC PANEL
Anion gap: 13 (ref 5–15)
BUN: 9 mg/dL (ref 6–23)
CO2: 30 meq/L (ref 19–32)
CREATININE: 0.39 mg/dL — AB (ref 0.50–1.35)
Calcium: 9.2 mg/dL (ref 8.4–10.5)
Chloride: 90 mEq/L — ABNORMAL LOW (ref 96–112)
GFR calc Af Amer: >90 mL/min (ref >90)
GFR calc non Af Amer: >90 mL/min (ref >90)
Glucose, Bld: 94 mg/dL (ref 70–99)
Potassium: 4.1 mEq/L (ref 3.7–5.3)
Sodium: 133 mEq/L — ABNORMAL LOW (ref 137–147)

## 2014-02-06 LAB — MAGNESIUM: Magnesium: 2 mg/dL (ref 1.5–2.5)

## 2014-02-07 LAB — CBC
HCT: 26 % — ABNORMAL LOW (ref 39.0–52.0)
HEMOGLOBIN: 8.4 g/dL — AB (ref 13.0–17.0)
MCH: 24.3 pg — ABNORMAL LOW (ref 26.0–34.0)
MCHC: 32.3 g/dL (ref 30.0–36.0)
MCV: 75.4 fL — AB (ref 78.0–100.0)
Platelets: 421 10*3/uL — ABNORMAL HIGH (ref 150–400)
RBC: 3.45 MIL/uL — ABNORMAL LOW (ref 4.22–5.81)
RDW: 15.7 % — ABNORMAL HIGH (ref 11.5–15.5)
WBC: 8.9 10*3/uL (ref 4.0–10.5)

## 2014-02-08 ENCOUNTER — Other Ambulatory Visit (HOSPITAL_COMMUNITY): Payer: Medicare Other

## 2014-02-08 DIAGNOSIS — R918 Other nonspecific abnormal finding of lung field: Secondary | ICD-10-CM | POA: Diagnosis not present

## 2014-02-09 LAB — BASIC METABOLIC PANEL
ANION GAP: 12 (ref 5–15)
BUN: 10 mg/dL (ref 6–23)
CALCIUM: 9 mg/dL (ref 8.4–10.5)
CO2: 27 mEq/L (ref 19–32)
CREATININE: 0.42 mg/dL — AB (ref 0.50–1.35)
Chloride: 90 mEq/L — ABNORMAL LOW (ref 96–112)
GFR calc Af Amer: >90 mL/min (ref >90)
GFR calc non Af Amer: >90 mL/min (ref >90)
Glucose, Bld: 89 mg/dL (ref 70–99)
Potassium: 4.2 mEq/L (ref 3.7–5.3)
Sodium: 129 mEq/L — ABNORMAL LOW (ref 137–147)

## 2014-02-09 LAB — CBC
HCT: 26.2 % — ABNORMAL LOW (ref 39.0–52.0)
Hemoglobin: 8.4 g/dL — ABNORMAL LOW (ref 13.0–17.0)
MCH: 24.1 pg — AB (ref 26.0–34.0)
MCHC: 32.1 g/dL (ref 30.0–36.0)
MCV: 75.1 fL — AB (ref 78.0–100.0)
PLATELETS: 330 10*3/uL (ref 150–400)
RBC: 3.49 MIL/uL — AB (ref 4.22–5.81)
RDW: 15.7 % — AB (ref 11.5–15.5)
WBC: 5.5 10*3/uL (ref 4.0–10.5)

## 2014-02-10 LAB — COMPREHENSIVE METABOLIC PANEL
ALT: 11 U/L (ref 0–53)
AST: 28 U/L (ref 0–37)
Albumin: 2.7 g/dL — ABNORMAL LOW (ref 3.5–5.2)
Alkaline Phosphatase: 148 U/L — ABNORMAL HIGH (ref 39–117)
Anion gap: 10 (ref 5–15)
BUN: 10 mg/dL (ref 6–23)
CALCIUM: 9.1 mg/dL (ref 8.4–10.5)
CO2: 27 meq/L (ref 19–32)
CREATININE: 0.41 mg/dL — AB (ref 0.50–1.35)
Chloride: 95 mEq/L — ABNORMAL LOW (ref 96–112)
GFR calc Af Amer: >90 mL/min (ref >90)
Glucose, Bld: 92 mg/dL (ref 70–99)
Potassium: 4.5 mEq/L (ref 3.7–5.3)
Sodium: 132 mEq/L — ABNORMAL LOW (ref 137–147)
Total Bilirubin: 0.2 mg/dL — ABNORMAL LOW (ref 0.3–1.2)
Total Protein: 7.9 g/dL (ref 6.0–8.3)

## 2014-02-12 LAB — BASIC METABOLIC PANEL
ANION GAP: 11 (ref 5–15)
BUN: 9 mg/dL (ref 6–23)
CHLORIDE: 96 meq/L (ref 96–112)
CO2: 25 mEq/L (ref 19–32)
Calcium: 8.9 mg/dL (ref 8.4–10.5)
Creatinine, Ser: 0.38 mg/dL — ABNORMAL LOW (ref 0.50–1.35)
GFR calc Af Amer: >90 mL/min (ref >90)
GFR calc non Af Amer: >90 mL/min (ref >90)
Glucose, Bld: 90 mg/dL (ref 70–99)
POTASSIUM: 4.3 meq/L (ref 3.7–5.3)
SODIUM: 132 meq/L — AB (ref 137–147)

## 2014-02-12 LAB — CBC
HCT: 26.7 % — ABNORMAL LOW (ref 39.0–52.0)
Hemoglobin: 8.6 g/dL — ABNORMAL LOW (ref 13.0–17.0)
MCH: 24 pg — ABNORMAL LOW (ref 26.0–34.0)
MCHC: 32.2 g/dL (ref 30.0–36.0)
MCV: 74.6 fL — ABNORMAL LOW (ref 78.0–100.0)
PLATELETS: 330 10*3/uL (ref 150–400)
RBC: 3.58 MIL/uL — AB (ref 4.22–5.81)
RDW: 15.2 % (ref 11.5–15.5)
WBC: 4.7 10*3/uL (ref 4.0–10.5)

## 2014-02-14 LAB — COMPREHENSIVE METABOLIC PANEL
ALBUMIN: 2.8 g/dL — AB (ref 3.5–5.2)
ALT: 10 U/L (ref 0–53)
ANION GAP: 14 (ref 5–15)
AST: 28 U/L (ref 0–37)
Alkaline Phosphatase: 132 U/L — ABNORMAL HIGH (ref 39–117)
BUN: 11 mg/dL (ref 6–23)
CHLORIDE: 93 meq/L — AB (ref 96–112)
CO2: 26 mEq/L (ref 19–32)
Calcium: 9.1 mg/dL (ref 8.4–10.5)
Creatinine, Ser: 0.38 mg/dL — ABNORMAL LOW (ref 0.50–1.35)
GFR calc Af Amer: >90 mL/min (ref >90)
GFR calc non Af Amer: >90 mL/min (ref >90)
Glucose, Bld: 99 mg/dL (ref 70–99)
Potassium: 4.1 mEq/L (ref 3.7–5.3)
Sodium: 133 mEq/L — ABNORMAL LOW (ref 137–147)
Total Bilirubin: 0.2 mg/dL — ABNORMAL LOW (ref 0.3–1.2)
Total Protein: 8.1 g/dL (ref 6.0–8.3)

## 2014-02-14 LAB — CBC WITH DIFFERENTIAL/PLATELET
BASOS PCT: 1 % (ref 0–1)
Basophils Absolute: 0 10*3/uL (ref 0.0–0.1)
Eosinophils Absolute: 0.4 10*3/uL (ref 0.0–0.7)
Eosinophils Relative: 7 % — ABNORMAL HIGH (ref 0–5)
HCT: 29.6 % — ABNORMAL LOW (ref 39.0–52.0)
HEMOGLOBIN: 9.4 g/dL — AB (ref 13.0–17.0)
LYMPHS ABS: 1.9 10*3/uL (ref 0.7–4.0)
Lymphocytes Relative: 38 % (ref 12–46)
MCH: 23.4 pg — ABNORMAL LOW (ref 26.0–34.0)
MCHC: 31.8 g/dL (ref 30.0–36.0)
MCV: 73.8 fL — ABNORMAL LOW (ref 78.0–100.0)
Monocytes Absolute: 0.8 10*3/uL (ref 0.1–1.0)
Monocytes Relative: 15 % — ABNORMAL HIGH (ref 3–12)
NEUTROS PCT: 39 % — AB (ref 43–77)
Neutro Abs: 2.1 10*3/uL (ref 1.7–7.7)
Platelets: 305 10*3/uL (ref 150–400)
RBC: 4.01 MIL/uL — AB (ref 4.22–5.81)
RDW: 14.9 % (ref 11.5–15.5)
WBC: 4.8 10*3/uL (ref 4.0–10.5)

## 2014-02-14 LAB — PHOSPHORUS: Phosphorus: 4.5 mg/dL (ref 2.3–4.6)

## 2014-02-14 LAB — MAGNESIUM: Magnesium: 1.9 mg/dL (ref 1.5–2.5)

## 2014-02-17 LAB — BASIC METABOLIC PANEL
Anion gap: 13 (ref 5–15)
BUN: 12 mg/dL (ref 6–23)
CHLORIDE: 92 meq/L — AB (ref 96–112)
CO2: 25 mEq/L (ref 19–32)
Calcium: 8.7 mg/dL (ref 8.4–10.5)
Creatinine, Ser: 0.38 mg/dL — ABNORMAL LOW (ref 0.50–1.35)
GFR calc Af Amer: >90 mL/min (ref >90)
Glucose, Bld: 96 mg/dL (ref 70–99)
POTASSIUM: 4.5 meq/L (ref 3.7–5.3)
Sodium: 130 mEq/L — ABNORMAL LOW (ref 137–147)

## 2014-02-17 LAB — CBC
HCT: 27.7 % — ABNORMAL LOW (ref 39.0–52.0)
HEMOGLOBIN: 9.2 g/dL — AB (ref 13.0–17.0)
MCH: 24.3 pg — AB (ref 26.0–34.0)
MCHC: 33.2 g/dL (ref 30.0–36.0)
MCV: 73.3 fL — AB (ref 78.0–100.0)
Platelets: ADEQUATE 10*3/uL (ref 150–400)
RBC: 3.78 MIL/uL — AB (ref 4.22–5.81)
RDW: 15 % (ref 11.5–15.5)
WBC: 6.3 10*3/uL (ref 4.0–10.5)

## 2014-02-18 LAB — BASIC METABOLIC PANEL
Anion gap: 11 (ref 5–15)
BUN: 11 mg/dL (ref 6–23)
CALCIUM: 8.7 mg/dL (ref 8.4–10.5)
CO2: 26 meq/L (ref 19–32)
CREATININE: 0.39 mg/dL — AB (ref 0.50–1.35)
Chloride: 96 mEq/L (ref 96–112)
GFR calc Af Amer: >90 mL/min (ref >90)
GFR calc non Af Amer: >90 mL/min (ref >90)
GLUCOSE: 94 mg/dL (ref 70–99)
Potassium: 4.1 mEq/L (ref 3.7–5.3)
Sodium: 133 mEq/L — ABNORMAL LOW (ref 137–147)

## 2014-02-19 DIAGNOSIS — M6249 Contracture of muscle, multiple sites: Secondary | ICD-10-CM | POA: Diagnosis not present

## 2014-02-20 DIAGNOSIS — M6249 Contracture of muscle, multiple sites: Secondary | ICD-10-CM | POA: Diagnosis not present

## 2014-02-21 DIAGNOSIS — M6249 Contracture of muscle, multiple sites: Secondary | ICD-10-CM | POA: Diagnosis not present

## 2014-02-22 ENCOUNTER — Non-Acute Institutional Stay (SKILLED_NURSING_FACILITY): Payer: Medicare Other | Admitting: Internal Medicine

## 2014-02-22 DIAGNOSIS — E871 Hypo-osmolality and hyponatremia: Secondary | ICD-10-CM

## 2014-02-22 DIAGNOSIS — Z931 Gastrostomy status: Secondary | ICD-10-CM | POA: Diagnosis not present

## 2014-02-22 DIAGNOSIS — A047 Enterocolitis due to Clostridium difficile: Secondary | ICD-10-CM

## 2014-02-22 DIAGNOSIS — G8250 Quadriplegia, unspecified: Secondary | ICD-10-CM | POA: Diagnosis not present

## 2014-02-22 DIAGNOSIS — A0472 Enterocolitis due to Clostridium difficile, not specified as recurrent: Secondary | ICD-10-CM

## 2014-02-22 DIAGNOSIS — M6249 Contracture of muscle, multiple sites: Secondary | ICD-10-CM | POA: Diagnosis not present

## 2014-02-22 DIAGNOSIS — R569 Unspecified convulsions: Secondary | ICD-10-CM | POA: Diagnosis not present

## 2014-02-22 DIAGNOSIS — I6200 Nontraumatic subdural hemorrhage, unspecified: Secondary | ICD-10-CM

## 2014-02-22 DIAGNOSIS — S065X9A Traumatic subdural hemorrhage with loss of consciousness of unspecified duration, initial encounter: Secondary | ICD-10-CM

## 2014-02-22 DIAGNOSIS — A419 Sepsis, unspecified organism: Secondary | ICD-10-CM

## 2014-02-22 DIAGNOSIS — D649 Anemia, unspecified: Secondary | ICD-10-CM

## 2014-02-22 DIAGNOSIS — E119 Type 2 diabetes mellitus without complications: Secondary | ICD-10-CM

## 2014-02-22 DIAGNOSIS — S065XAA Traumatic subdural hemorrhage with loss of consciousness status unknown, initial encounter: Secondary | ICD-10-CM

## 2014-02-22 DIAGNOSIS — R6521 Severe sepsis with septic shock: Secondary | ICD-10-CM

## 2014-02-22 NOTE — Progress Notes (Signed)
MRN: [id number] Name: [name]  Sex: male Age: 58 y.o. DOB: 12-11-1955  [facility] #: [name] Facility/Room:214 Level Of Care: SNF Provider: Merrilee Seashore D Emergency Contacts: Extended Emergency Contact Information Primary Emergency Contact: Perham,[name] Address: [address]          [place], [place] [address] [place] Home Phone: [phone] Mobile Phone: [phone] Relation: Mother Secondary Emergency Contact: Valinda Party [place] Home Phone: [phone] Relation: Brother Father: B[place], [place] Mobile Phone: [phone]  Code Status: FULL  Allergies: Aspirin; Nsaids; and Penicillins  Chief Complaint  Patient presents with  . nursing home admission    HPI: Patient is 58 y.o. male who quadriplegic, minimally interactive admitted for another round of sepsis AND C diff to [facility] as resident.  Past Medical History  Diagnosis Date  . Quadriplegia   . Depression   . Anemia   . Parkinson's disease   . Seizures   . Adrenal insufficiency   . Dysphagia 06/19/2013  . Aphasia 06/19/2013    Past Surgical History  Procedure Laterality Date  . Peg tube placement    . Peripherally inserted central catheter insertion    . Tonsillectomy  1962      Medication List       This list is accurate as of: 02/22/14 11:59 PM.  Always use your most recent med list.               baclofen 10 MG tablet  Commonly known as:  LIORESAL  Place 1 tablet (10 mg total) into feeding tube 3 (three) times daily.     dextrose 5 % and 0.45% NaCl infusion  Inject 100 mL/hr into the vein continuous.     feeding supplement (OSMOLITE 1.5 CAL) Liqd  Place 1,000 mLs into feeding tube continuous.     feeding supplement (PRO-STAT SUGAR FREE 64) Liqd  Place 30 mLs into feeding tube 3 (three) times daily.     ferrous sulfate 300 (60 FE) MG/5ML syrup  Place 5 mLs (300 mg total) into  feeding tube daily.     free water Soln  Place 250 mLs into feeding tube every 6 (six) hours.     hydrocortisone sodium succinate 100 MG Solr injection  Commonly known as:  SOLU-CORTEF  Inject 1 mL (50 mg total) into the vein every 12 (twelve) hours.     metroNIDAZOLE 50 mg/ml oral suspension  Commonly known as:  FLAGYL  Place 10 mLs (500 mg total) into feeding tube every 8 (eight) hours.     olopatadine 0.1 % ophthalmic solution  Commonly known as:  PATANOL  Place 1 drop into both eyes every morning.     ondansetron 4 MG/2ML Soln injection  Commonly known as:  ZOFRAN  Inject 2 mLs (4 mg total) into the vein every 6 (six) hours as needed for nausea or vomiting.     phenytoin 125 MG/5ML suspension  Commonly known as:  DILANTIN  Place 5 mLs (125 mg total) into feeding tube 2 (two) times daily.     polyvinyl alcohol 1.4 % ophthalmic solution  Commonly known as:  LIQUIFILM TEARS  Place 1 drop into both eyes daily.     saccharomyces boulardii 250 MG capsule  Commonly known as:  FLORASTOR  Take 1 capsule (250 mg total) by mouth 2 (two) times daily.     vancomycin 50 mg/mL oral solution  Commonly known as:  VANCOCIN  Place 2.5 mLs (125 mg total) into feeding tube every 6 (six) hours.        No orders of the defined types were placed in this encounter.    Immunization History  Administered Date(s) Administered  . Influenza Whole 12/31/2012    History  Substance Use Topics  . Smoking status: Never Smoker   . Smokeless tobacco: Never Used  . Alcohol Use: No    Family history is noncontributory    Review of Systems   UTO ;nurse without concerns   Filed Vitals:   02/22/14 2227  BP: 125/70  Pulse: 96  Temp: 99.3 F (37.4 C)  Resp: 20    Physical Exam  GENERAL APPEARANCE: Alert,non conversant,  No acute distress.  SKIN: No diaphoresis rash HEAD: Normocephalic, atraumatic  EYES: Conjunctiva/lids clear. Pupils round,  EARS: External exam WNL, canals clear   NOSE: No deformity or discharge.  MOUTH/THROAT: Lips w/o lesions  RESPIRATORY: Breathing is even, unlabored. Lung sounds are clear   CARDIOVASCULAR: Heart RRR no murmurs, rubs or gallops. No peripheral edema.   GASTROINTESTINAL: Abdomen is soft, non-tender, not distended w/ normal bowel sounds; PEG tube GENITOURINARY: Bladder non tender, not distended  MUSCULOSKELETAL: contractures NEUROLOGIC:  Quad PSYCHIATRIC: no behavioral issues  Patient Active Problem List   Diagnosis Date Noted  . SIRS (systemic inflammatory response syndrome) 03/11/2014  . Acute bronchiolitis due to unspecified organism 03/11/2014  . Enteritis due to Clostridium difficile 03/11/2014  . Type 2 diabetes mellitus without complication 03/11/2014  . Septic shock 01/26/2014  . Sepsis 01/25/2014  . Fecal impaction 01/04/2014  . Acute encephalopathy 01/02/2014  . Acute respiratory failure with hypercapnia 01/02/2014  . Chronic anemia 01/02/2014  . Subdural hematoma 01/02/2014  . Pain in thoracic spine 12/26/2013  . PEG (percutaneous endoscopic gastrostomy) status 10/04/2013  . Rash and nonspecific skin eruption 07/08/2013  . Thrush 07/08/2013  . Heme positive stool 07/07/2013  . Severe sepsis 06/23/2013  . E. coli pyelonephritis 06/22/2013  . Leukocytosis 06/19/2013  . Bronchitis 06/19/2013  . Dysphagia 06/19/2013  . Aphasia 06/19/2013  . Fever, unspecified 06/19/2013  . Other convulsions 08/10/2012  . Iron deficiency anemia, unspecified 08/10/2012  . Glucocorticoid deficiency 08/10/2012  . Paralysis agitans 08/10/2012  . Nausea vomiting and diarrhea 06/15/2012  . Fever 06/15/2012  . UTI (lower urinary tract infection) 06/15/2012  . Hypokalemia 11/10/2011  . Nausea & vomiting 11/08/2011  . Ileus 11/08/2011  . Hyponatremia 11/08/2011  . Adrenal insufficiency 11/08/2011  . Quadriplegia 11/08/2011  . Parkinson's disease   . Seizures   . Anemia   . Depression     CBC    Component Value Date/Time    WBC 8.4 03/11/2014 0558   WBC 7.0 09/24/2013   RBC 3.83* 03/11/2014 0558   HGB 9.3* 03/11/2014 0558   HCT 28.8* 03/11/2014 0558   PLT 277 03/11/2014 0558   MCV 75.2* 03/11/2014 0558   LYMPHSABS 1.9 03/11/2014 0558   MONOABS 0.6 03/11/2014 0558   EOSABS 0.5 03/11/2014 0558   BASOSABS 0.0 03/11/2014 0558    CMP     Component Value Date/Time   NA 139 03/11/2014 0558   NA 134* 09/24/2013   K 3.7 03/11/2014 0558   CL 102 03/11/2014 0558   CO2 26 03/11/2014 0558   GLUCOSE 91 03/11/2014 0558   BUN 20 03/11/2014 0558   BUN 18 09/24/2013   CREATININE 0.42* 03/11/2014 0558   CREATININE 0.5* 09/24/2013   CALCIUM 8.9 03/11/2014 0558   PROT  8.1 03/11/2014 0558   ALBUMIN 2.8* 03/11/2014 0558   AST 25 03/11/2014 0558   ALT 10 03/11/2014 0558   ALKPHOS 151* 03/11/2014 0558   BILITOT 0.5 03/11/2014 0558   GFRNONAA >90 03/11/2014 0558   GFRAA >90 03/11/2014 0558    Assessment and Plan  Septic shock 2/2 to UTI and [facility] to [facility] cefapime until 11/14  Enteritis due to Clostridium difficile [facility] vanc and flagyl until 11/25; had rectal tube in hospital which is d/c  Quadriplegia Underlying problem with quad since 2004 2/2 non traumatic neuro damage 2/2 ETOH abuse  Subdural hematoma Dx 12/2013, acute on chronic,stable at this time  Seizures Continue dilantin  Type 2 diabetes mellitus without complication Diet controlled  Hyponatremia Treated with Na tablets BID for 3 days  Chronic anemia Hb stable at 9.4. Need to continue iron but can d/c folate   Date of visit 02/19/2014 Margit Hanks, MD

## 2014-02-23 DIAGNOSIS — M6249 Contracture of muscle, multiple sites: Secondary | ICD-10-CM | POA: Diagnosis not present

## 2014-02-24 DIAGNOSIS — Z431 Encounter for attention to gastrostomy: Secondary | ICD-10-CM | POA: Diagnosis not present

## 2014-02-24 DIAGNOSIS — M6249 Contracture of muscle, multiple sites: Secondary | ICD-10-CM | POA: Diagnosis not present

## 2014-02-25 DIAGNOSIS — A0472 Enterocolitis due to Clostridium difficile, not specified as recurrent: Secondary | ICD-10-CM

## 2014-02-25 DIAGNOSIS — M6249 Contracture of muscle, multiple sites: Secondary | ICD-10-CM | POA: Diagnosis not present

## 2014-02-25 DIAGNOSIS — M6281 Muscle weakness (generalized): Secondary | ICD-10-CM | POA: Diagnosis not present

## 2014-02-25 HISTORY — DX: Enterocolitis due to Clostridium difficile, not specified as recurrent: A04.72

## 2014-02-26 DIAGNOSIS — M6249 Contracture of muscle, multiple sites: Secondary | ICD-10-CM | POA: Diagnosis not present

## 2014-02-26 DIAGNOSIS — M6281 Muscle weakness (generalized): Secondary | ICD-10-CM | POA: Diagnosis not present

## 2014-02-27 DIAGNOSIS — D638 Anemia in other chronic diseases classified elsewhere: Secondary | ICD-10-CM | POA: Diagnosis not present

## 2014-02-27 DIAGNOSIS — M6249 Contracture of muscle, multiple sites: Secondary | ICD-10-CM | POA: Diagnosis not present

## 2014-02-27 DIAGNOSIS — M6281 Muscle weakness (generalized): Secondary | ICD-10-CM | POA: Diagnosis not present

## 2014-02-28 DIAGNOSIS — M6281 Muscle weakness (generalized): Secondary | ICD-10-CM | POA: Diagnosis not present

## 2014-02-28 DIAGNOSIS — M6249 Contracture of muscle, multiple sites: Secondary | ICD-10-CM | POA: Diagnosis not present

## 2014-03-03 DIAGNOSIS — M6281 Muscle weakness (generalized): Secondary | ICD-10-CM | POA: Diagnosis not present

## 2014-03-03 DIAGNOSIS — M6249 Contracture of muscle, multiple sites: Secondary | ICD-10-CM | POA: Diagnosis not present

## 2014-03-04 DIAGNOSIS — M6281 Muscle weakness (generalized): Secondary | ICD-10-CM | POA: Diagnosis not present

## 2014-03-04 DIAGNOSIS — M6249 Contracture of muscle, multiple sites: Secondary | ICD-10-CM | POA: Diagnosis not present

## 2014-03-06 DIAGNOSIS — M6281 Muscle weakness (generalized): Secondary | ICD-10-CM | POA: Diagnosis not present

## 2014-03-06 DIAGNOSIS — M6249 Contracture of muscle, multiple sites: Secondary | ICD-10-CM | POA: Diagnosis not present

## 2014-03-07 DIAGNOSIS — M6249 Contracture of muscle, multiple sites: Secondary | ICD-10-CM | POA: Diagnosis not present

## 2014-03-07 DIAGNOSIS — M6281 Muscle weakness (generalized): Secondary | ICD-10-CM | POA: Diagnosis not present

## 2014-03-08 DIAGNOSIS — M6281 Muscle weakness (generalized): Secondary | ICD-10-CM | POA: Diagnosis not present

## 2014-03-08 DIAGNOSIS — M6249 Contracture of muscle, multiple sites: Secondary | ICD-10-CM | POA: Diagnosis not present

## 2014-03-10 ENCOUNTER — Emergency Department (HOSPITAL_COMMUNITY): Payer: Medicare Other

## 2014-03-10 ENCOUNTER — Non-Acute Institutional Stay (SKILLED_NURSING_FACILITY): Payer: Medicare Other | Admitting: Internal Medicine

## 2014-03-10 ENCOUNTER — Inpatient Hospital Stay (HOSPITAL_COMMUNITY)
Admission: EM | Admit: 2014-03-10 | Discharge: 2014-03-13 | DRG: 371 | Disposition: A | Discharge status: Skilled Nursing Facility | Payer: Medicare Other | Attending: Internal Medicine | Admitting: Internal Medicine

## 2014-03-10 ENCOUNTER — Encounter (HOSPITAL_COMMUNITY): Payer: Self-pay

## 2014-03-10 DIAGNOSIS — Z931 Gastrostomy status: Secondary | ICD-10-CM

## 2014-03-10 DIAGNOSIS — Z79899 Other long term (current) drug therapy: Secondary | ICD-10-CM | POA: Diagnosis not present

## 2014-03-10 DIAGNOSIS — R6510 Systemic inflammatory response syndrome (SIRS) of non-infectious origin without acute organ dysfunction: Secondary | ICD-10-CM | POA: Diagnosis present

## 2014-03-10 DIAGNOSIS — R404 Transient alteration of awareness: Secondary | ICD-10-CM | POA: Diagnosis not present

## 2014-03-10 DIAGNOSIS — E876 Hypokalemia: Secondary | ICD-10-CM | POA: Diagnosis not present

## 2014-03-10 DIAGNOSIS — Z880 Allergy status to penicillin: Secondary | ICD-10-CM

## 2014-03-10 DIAGNOSIS — R1110 Vomiting, unspecified: Secondary | ICD-10-CM

## 2014-03-10 DIAGNOSIS — Z888 Allergy status to other drugs, medicaments and biological substances status: Secondary | ICD-10-CM | POA: Diagnosis not present

## 2014-03-10 DIAGNOSIS — Z22322 Carrier or suspected carrier of Methicillin resistant Staphylococcus aureus: Secondary | ICD-10-CM

## 2014-03-10 DIAGNOSIS — R5081 Fever presenting with conditions classified elsewhere: Secondary | ICD-10-CM

## 2014-03-10 DIAGNOSIS — G20 Parkinson's disease: Secondary | ICD-10-CM | POA: Diagnosis present

## 2014-03-10 DIAGNOSIS — J440 Chronic obstructive pulmonary disease with acute lower respiratory infection: Secondary | ICD-10-CM | POA: Diagnosis present

## 2014-03-10 DIAGNOSIS — E2740 Unspecified adrenocortical insufficiency: Secondary | ICD-10-CM | POA: Diagnosis present

## 2014-03-10 DIAGNOSIS — N281 Cyst of kidney, acquired: Secondary | ICD-10-CM | POA: Diagnosis not present

## 2014-03-10 DIAGNOSIS — R1310 Dysphagia, unspecified: Secondary | ICD-10-CM | POA: Diagnosis present

## 2014-03-10 DIAGNOSIS — R4020 Unspecified coma: Secondary | ICD-10-CM | POA: Diagnosis not present

## 2014-03-10 DIAGNOSIS — A419 Sepsis, unspecified organism: Secondary | ICD-10-CM | POA: Diagnosis not present

## 2014-03-10 DIAGNOSIS — A047 Enterocolitis due to Clostridium difficile: Secondary | ICD-10-CM | POA: Diagnosis present

## 2014-03-10 DIAGNOSIS — S065X9A Traumatic subdural hemorrhage with loss of consciousness of unspecified duration, initial encounter: Secondary | ICD-10-CM

## 2014-03-10 DIAGNOSIS — E1143 Type 2 diabetes mellitus with diabetic autonomic (poly)neuropathy: Secondary | ICD-10-CM | POA: Diagnosis present

## 2014-03-10 DIAGNOSIS — G20A1 Parkinson's disease without dyskinesia, without mention of fluctuations: Secondary | ICD-10-CM | POA: Diagnosis present

## 2014-03-10 DIAGNOSIS — Z794 Long term (current) use of insulin: Secondary | ICD-10-CM

## 2014-03-10 DIAGNOSIS — R112 Nausea with vomiting, unspecified: Secondary | ICD-10-CM | POA: Diagnosis present

## 2014-03-10 DIAGNOSIS — D638 Anemia in other chronic diseases classified elsewhere: Secondary | ICD-10-CM | POA: Diagnosis present

## 2014-03-10 DIAGNOSIS — R5383 Other fatigue: Secondary | ICD-10-CM | POA: Diagnosis not present

## 2014-03-10 DIAGNOSIS — F329 Major depressive disorder, single episode, unspecified: Secondary | ICD-10-CM | POA: Diagnosis present

## 2014-03-10 DIAGNOSIS — G8250 Quadriplegia, unspecified: Secondary | ICD-10-CM | POA: Diagnosis present

## 2014-03-10 DIAGNOSIS — R569 Unspecified convulsions: Secondary | ICD-10-CM

## 2014-03-10 DIAGNOSIS — R0682 Tachypnea, not elsewhere classified: Secondary | ICD-10-CM

## 2014-03-10 DIAGNOSIS — I6200 Nontraumatic subdural hemorrhage, unspecified: Secondary | ICD-10-CM | POA: Diagnosis not present

## 2014-03-10 DIAGNOSIS — S065XAA Traumatic subdural hemorrhage with loss of consciousness status unknown, initial encounter: Secondary | ICD-10-CM

## 2014-03-10 DIAGNOSIS — R400 Somnolence: Secondary | ICD-10-CM | POA: Diagnosis not present

## 2014-03-10 DIAGNOSIS — R4701 Aphasia: Secondary | ICD-10-CM | POA: Diagnosis present

## 2014-03-10 DIAGNOSIS — J219 Acute bronchiolitis, unspecified: Secondary | ICD-10-CM | POA: Diagnosis present

## 2014-03-10 DIAGNOSIS — K3184 Gastroparesis: Secondary | ICD-10-CM

## 2014-03-10 DIAGNOSIS — R531 Weakness: Secondary | ICD-10-CM | POA: Diagnosis not present

## 2014-03-10 DIAGNOSIS — S065X0D Traumatic subdural hemorrhage without loss of consciousness, subsequent encounter: Secondary | ICD-10-CM | POA: Diagnosis not present

## 2014-03-10 DIAGNOSIS — N3090 Cystitis, unspecified without hematuria: Secondary | ICD-10-CM | POA: Diagnosis not present

## 2014-03-10 DIAGNOSIS — G43A0 Cyclical vomiting, not intractable: Secondary | ICD-10-CM | POA: Diagnosis not present

## 2014-03-10 DIAGNOSIS — R509 Fever, unspecified: Secondary | ICD-10-CM

## 2014-03-10 LAB — BASIC METABOLIC PANEL
Anion gap: 12 (ref 5–15)
BUN: 26 mg/dL — ABNORMAL HIGH (ref 6–23)
CALCIUM: 9.4 mg/dL (ref 8.4–10.5)
CHLORIDE: 98 meq/L (ref 96–112)
CO2: 28 mEq/L (ref 19–32)
Creatinine, Ser: 0.46 mg/dL — ABNORMAL LOW (ref 0.50–1.35)
GFR calc Af Amer: >90 mL/min (ref >90)
GFR calc non Af Amer: >90 mL/min (ref >90)
GLUCOSE: 90 mg/dL (ref 70–99)
Potassium: 3.7 mEq/L (ref 3.7–5.3)
Sodium: 138 mEq/L (ref 137–147)

## 2014-03-10 LAB — CBC WITH DIFFERENTIAL/PLATELET
BASOS PCT: 0 % (ref 0–1)
Basophils Absolute: 0 10*3/uL (ref 0.0–0.1)
EOS ABS: 0.6 10*3/uL (ref 0.0–0.7)
EOS PCT: 5 % (ref 0–5)
HEMATOCRIT: 31.6 % — AB (ref 39.0–52.0)
HEMOGLOBIN: 10.2 g/dL — AB (ref 13.0–17.0)
Lymphocytes Relative: 19 % (ref 12–46)
Lymphs Abs: 2.2 10*3/uL (ref 0.7–4.0)
MCH: 23.6 pg — AB (ref 26.0–34.0)
MCHC: 32.3 g/dL (ref 30.0–36.0)
MCV: 73.1 fL — ABNORMAL LOW (ref 78.0–100.0)
MONO ABS: 0.8 10*3/uL (ref 0.1–1.0)
Monocytes Relative: 7 % (ref 3–12)
Neutro Abs: 8.2 10*3/uL — ABNORMAL HIGH (ref 1.7–7.7)
Neutrophils Relative %: 69 % (ref 43–77)
Platelets: 300 10*3/uL (ref 150–400)
RBC: 4.32 MIL/uL (ref 4.22–5.81)
RDW: 14.2 % (ref 11.5–15.5)
WBC: 11.8 10*3/uL — ABNORMAL HIGH (ref 4.0–10.5)

## 2014-03-10 LAB — URINALYSIS, ROUTINE W REFLEX MICROSCOPIC
Bilirubin Urine: NEGATIVE
GLUCOSE, UA: NEGATIVE mg/dL
Ketones, ur: NEGATIVE mg/dL
LEUKOCYTES UA: NEGATIVE
Nitrite: NEGATIVE
PH: 5.5 (ref 5.0–8.0)
PROTEIN: 30 mg/dL — AB
SPECIFIC GRAVITY, URINE: 1.028 (ref 1.005–1.030)
Urobilinogen, UA: 1 mg/dL (ref 0.0–1.0)

## 2014-03-10 LAB — URINE MICROSCOPIC-ADD ON

## 2014-03-10 LAB — I-STAT CG4 LACTIC ACID, ED: Lactic Acid, Venous: 1.22 mmol/L (ref 0.5–2.2)

## 2014-03-10 MED ORDER — SODIUM CHLORIDE 0.9 % IV BOLUS (SEPSIS)
1000.0000 mL | Freq: Once | INTRAVENOUS | Status: AC
  Administered 2014-03-10: 1000 mL via INTRAVENOUS

## 2014-03-10 MED ORDER — CEFEPIME HCL 1 G IJ SOLR
1.0000 g | Freq: Three times a day (TID) | INTRAMUSCULAR | Status: DC
  Administered 2014-03-10 – 2014-03-11 (×2): 1 g via INTRAVENOUS
  Filled 2014-03-10 (×5): qty 1

## 2014-03-10 MED ORDER — IOHEXOL 300 MG/ML  SOLN
100.0000 mL | Freq: Once | INTRAMUSCULAR | Status: AC | PRN
  Administered 2014-03-10: 100 mL via INTRAVENOUS

## 2014-03-10 MED ORDER — ACETAMINOPHEN 650 MG RE SUPP
650.0000 mg | Freq: Once | RECTAL | Status: AC
  Administered 2014-03-10: 650 mg via RECTAL
  Filled 2014-03-10: qty 1

## 2014-03-10 MED ORDER — VANCOMYCIN HCL IN DEXTROSE 1-5 GM/200ML-% IV SOLN
1000.0000 mg | Freq: Three times a day (TID) | INTRAVENOUS | Status: DC
  Administered 2014-03-10 – 2014-03-11 (×3): 1000 mg via INTRAVENOUS
  Filled 2014-03-10 (×5): qty 200

## 2014-03-10 MED ORDER — SODIUM CHLORIDE 0.9 % IV BOLUS (SEPSIS)
1000.0000 mL | Freq: Once | INTRAVENOUS | Status: AC
Start: 2014-03-10 — End: 2014-03-10
  Administered 2014-03-10: 1000 mL via INTRAVENOUS

## 2014-03-10 MED ORDER — IOHEXOL 300 MG/ML  SOLN: 25.0000 mL | INTRAMUSCULAR | Status: AC

## 2014-03-10 NOTE — ED Notes (Signed)
Per mother, patient has had a decrease in level of consciousness for the past week with low grade fever.

## 2014-03-10 NOTE — ED Notes (Signed)
Pt. Nonverbal at baseline. Pt. Is diaphoretic and tachypnea with labored breathing.

## 2014-03-10 NOTE — ED Notes (Signed)
Per EMS, patient coming from [facility] with a fever x1 week.

## 2014-03-10 NOTE — ED Provider Notes (Signed)
TIME SEEN: 7:54 PM  CHIEF COMPLAINT: Fever, tachypnea, vomiting, diarrhea  HPI: Pt is a 58 y/o man with a history of neurological injury in ?2002 (non-traumatic, possibly related to alcohol abuse vs central pontine myelinolysis?) that has left with dense quadriplegia, seizures, adrenal insuffiency, HTN, DM with recent admissions for subdural hematoma in October 2015, UTI and bacteremia, C. difficile colitis who presents from at [facility] facility with fever, tachypnea, cough and vomiting. Unable to obtain any history from patient given his baseline altered mental status. Most of the history is provided by patient's mother who is an equally poor historian. Patient receives all of his feedings through a PEG tube. PICC line has been removed. Was on 2 weeks of cefepime starting 01/28/14. He does hydrocortisone injections and is currently on and vancomycin orally and Flagyl.     ROS: Unobtainable secondary to altered mental status  PAST MEDICAL HISTORY/PAST SURGICAL HISTORY:  Past Medical History  Diagnosis Date  . Quadriplegia   . Depression   . Anemia   . Parkinson's disease   . Seizures   . Adrenal insufficiency   . Dysphagia 06/19/2013  . Aphasia 06/19/2013    MEDICATIONS:  Prior to Admission medications   Medication Sig Start Date End Date Taking? Authorizing Provider  Amino Acids-Protein Hydrolys (FEEDING SUPPLEMENT, PRO-STAT SUGAR FREE 64,) LIQD Place 30 mLs into feeding tube 3 (three) times daily. 01/28/14   Silver Huguenin Elgergawy, MD  baclofen (LIORESAL) 10 MG tablet Place 1 tablet (10 mg total) into feeding tube 3 (three) times daily. 01/28/14   Silver Huguenin Elgergawy, MD  Dextrose-Sodium Chloride (DEXTROSE 5 % AND 0.45% NACL) infusion Inject 100 mL/hr into the vein continuous. 01/28/14   Silver Huguenin Elgergawy, MD  ferrous sulfate 300 (60 FE) MG/5ML syrup Place 5 mLs (300 mg total) into feeding tube daily. 01/28/14   Silver Huguenin Elgergawy, MD  hydrocortisone sodium succinate  (SOLU-CORTEF) 100 MG SOLR injection Inject 1 mL (50 mg total) into the vein every 12 (twelve) hours. 01/28/14   Silver Huguenin Elgergawy, MD  metroNIDAZOLE (FLAGYL) 50 mg/ml oral suspension Place 10 mLs (500 mg total) into feeding tube every 8 (eight) hours. 01/28/14   Silver Huguenin Elgergawy, MD  Nutritional Supplements (FEEDING SUPPLEMENT, OSMOLITE 1.5 CAL,) LIQD Place 1,000 mLs into feeding tube continuous. 01/28/14   Silver Huguenin Elgergawy, MD  olopatadine (PATANOL) 0.1 % ophthalmic solution Place 1 drop into both eyes every morning. 01/28/14   Silver Huguenin Elgergawy, MD  ondansetron (ZOFRAN) 4 MG/2ML SOLN injection Inject 2 mLs (4 mg total) into the vein every 6 (six) hours as needed for nausea or vomiting. 01/28/14   Albertine Patricia, MD  phenytoin (DILANTIN) 125 MG/5ML suspension Place 5 mLs (125 mg total) into feeding tube 2 (two) times daily. 01/28/14   Silver Huguenin Elgergawy, MD  polyvinyl alcohol (LIQUIFILM TEARS) 1.4 % ophthalmic solution Place 1 drop into both eyes daily. 01/28/14   Silver Huguenin Elgergawy, MD  saccharomyces boulardii (FLORASTOR) 250 MG capsule Take 1 capsule (250 mg total) by mouth 2 (two) times daily. 01/28/14   Silver Huguenin Elgergawy, MD  vancomycin (VANCOCIN) 50 mg/mL oral solution Place 2.5 mLs (125 mg total) into feeding tube every 6 (six) hours. 01/28/14   Albertine Patricia, MD  Water For Irrigation, Sterile (FREE WATER) SOLN Place 250 mLs into feeding tube every 6 (six) hours. 01/28/14   Albertine Patricia, MD    ALLERGIES:  Allergies  Allergen Reactions  . Aspirin Other (See Comments)  Unknown reaction  . Nsaids Other (See Comments)    Unknown reaction  . Penicillins Other (See Comments)    Unknown reaction.  Pt has tolerated cephalosporins in the past.    SOCIAL HISTORY:  History  Substance Use Topics  . Smoking status: Never Smoker   . Smokeless tobacco: Never Used  . Alcohol Use: No    FAMILY HISTORY: Family History  Problem Relation Age of Onset  . Hypothyroidism Mother   .  Hypertension Mother   . Diabetes Father     EXAM: BP 126/74 mmHg  Pulse 67  Temp(Src) 100 F (37.8 C) (Rectal)  Resp 35  SpO2 96% CONSTITUTIONAL: Patient does not answer questions or respond to commands, chronically ill-appearing, a phasic HEAD: Normocephalic EYES: Conjunctivae clear, PERRL ENT: normal nose; no rhinorrhea; moist mucous membranes; pharynx without lesions noted NECK: Supple, no meningismus, no LAD  CARD: RRR; S1 and S2 appreciated; no murmurs, no clicks, no rubs, no gallops RESP: Normal chest excursion without splinting almost patient is tachypneic but not hypoxic, rhonchorous breath sounds diffusely, mild increased work of breathing ABD/GI: Normal bowel sounds; non-distended; soft, non-tender, no rebound, no guarding PEG tube in place BACK:  The back appears normal and is non-tender to palpation, there is no CVA tenderness patient is a sacral decubitus ulcer with no surrounding signs of infection EXT: Normal ROM in all joints; non-tender to palpation; no edema; normal capillary refill; no cyanosis    SKIN: Normal color for age and race; warm NEURO: Almost contracted bilateral upper and lower extremities, extremities are atrophied PSYCH: The patient's mood and manner are appropriate. Grooming and personal hygiene are appropriate.  MEDICAL DECISION MAKING: Patient here with fever, tachypnea. He meets SIRS criteria. He is otherwise hemodynamically stable. Will give broad-spectrum antibiotics, IV fluids. Will obtain labs, cultures, lactate, chest x-ray, and urine. Recently here with C. difficile colitis. We'll send stool cultures if patient has a bowel movement.  ED PROGRESS: Urine shows hematuria but no other sign of infection. Culture pending. Patient does have a leukocytosis of 11.8. Otherwise labs unremarkable. Lactate normal. Chest x-ray shows no acute abnormality but has low lung volumes. Per patient's father patient does appear to be at his baseline mental status given  his recent subdural, will obtain a head CT. Will also obtain chest and abdominal CTs given his rhonchorous breath sounds, tachypnea and vomiting. Anticipate admission.   12:50 PM  Pt's head CT shows improving SDH, improving shift. Does appear to have R sided mastoiditis on CT.  Chest CT shows no obvious infiltrate. Abdominal CT shows bilateral thickening consistent with cystitis. Urine culture is pending.  Possible UTI versus as the source of his SIRS.  Given he has had bacteremia many times in the past and is still tachypneic, will admit for observation for septic workup.  1:20 Am  Spoke with Dr. Posey Pronto for admission to tele, inpt.    CRITICAL CARE Performed by: Nyra Jabs   Total critical care time: 45 minutes  Critical care time was exclusive of separately billable procedures and treating other patients.  Critical care was necessary to treat or prevent imminent or life-threatening deterioration.  Critical care was time spent personally by me on the following activities: development of treatment plan with patient and/or surrogate as well as nursing, discussions with consultants, evaluation of patient's response to treatment, examination of patient, obtaining history from patient or surrogate, ordering and performing treatments and interventions, ordering and review of laboratory studies, ordering and review of radiographic  studies, pulse oximetry and re-evaluation of patient's condition.   [place], DO 03/11/14 0120

## 2014-03-10 NOTE — Progress Notes (Signed)
Patient ID: [name], male   DOB: [birth year], 58 y.o.   MRN: [id number]   This is an acute visit.  Level of care skilled.  [facility].  Chief complaint-acute visit secondary to fever-lethargy-elevated respiratory rate.  History of present illness.  Patient is a very complex 58 year old male with a history of quadriplegia secondary to nontraumatic neurologic injury related to alcohol abuse-.  Patient initially was hospitalized back in October with lethargy and a large subdural hematoma with acute on chronic component.  He was admitted to long-term acute care hospital later that month for increased lethargy and fever with a history of C. difficile. Later he was readmitted to [facility] with septic shock hypotension and gram-negative bacteremia with urinary tract infection.  [facility] C. difficile infection and finished IV cefepime--.  He recently returned to the facility and has been relatively stable however today his mother reports increased lethargy apparently he's had a temperature of slightly over 100 as well and I noted his respiratory rate appears to be elevated in the high 30s.  Patient is a phasic and cannot really give any review of systems.  Family medical social history as been reviewed per readmission note on 02/22/2014.  Medications have been reviewed per MAR.  Review of systems essentially unobtainable as noted above.  Physical exam.  Temperature currently is 98.0 apparently had been elevated previously-pulse 90-respirations of 36 I got varying between 32 and 40-blood pressure 110/70 O2 saturation is 93%.  In general this is a frail middle-aged male somewhat lethargic but easily arousable he does not appear to be in acute distress but does have an elevated respiratory rate with some labored breathing.  Oropharynx he does have some dried film on his  tongue   Eyes he does have an irregular left pupil.  Visual acuity appears grossly intact.  Chest he has an elevated respiratory rate ranging between the high 30s to 40-has slightly labored breathing I could not really appreciate chest congestion but there was somewhat poor respiratory effort.  Heart is regular rate and rhythm without murmur gallop or rub he does not have significant lower extremity edema.  Abdomen he does have a PEG tube which appears within normal limits there is no drainage or bleeding or erythema bowel sounds appear to be positive.  Musculoskeletal he does have a history quadriplegia this appears to be baseline he is able to move his legs to some extent which is not new he does have left upper arm paralysis.  Neurologic as stated above he is relatively phasic with neurologic deficits quadriplegia.   psych he does follow simple verbal commands.  Labs.  02/27/2014.  WBC 6.5 hemoglobin 9.5 platelets 3 and 6.  02/24/2014.  Sodium 136 potassium 4 BUN 15 creatinine 0.4.  Assessment and plan.  #1-intermittent fever of unknown origin-with elevated respiratory rate and some lethargy-patient is a very fragile individual with above-stated issues of recent sepsis C. difficile and bacteremia-I did discuss this fairly extensively with his mother who is at bedside-we will send to the ER for expedient evaluation-  I did evaluate patient serially before EMS arrived he remained stable again with somewhat elevated respiratory rate-he did not appear to be acutely deteriorating although a very fragile individual.  [id number]

## 2014-03-10 NOTE — Progress Notes (Signed)
ANTIBIOTIC CONSULT NOTE - INITIAL  Pharmacy Consult for Vancomycin and Cefepime Indication: rule out sepsis  Allergies  Allergen Reactions  . Aspirin Other (See Comments)    Unknown reaction  . Nsaids Other (See Comments)    Unknown reaction  . Penicillins Other (See Comments)    Unknown reaction.  Pt has tolerated cephalosporins in the past.    Patient Measurements:   Actual Body Weight: 76 kg  Vital Signs: Temp: 100 F (37.8 C) (12/14 1811) Temp Source: Rectal (12/14 1811) BP: 115/55 mmHg (12/14 2000) Pulse Rate: 85 (12/14 2000) Intake/Output from previous day:   Intake/Output from this shift: Total I/O In: -  Out: 250 [Urine:250]  Labs:  Recent Labs  03/10/14 1841  WBC 11.8*  HGB 10.2*  PLT 300  CREATININE 0.46*   CrCl cannot be calculated (Unknown ideal weight.). No results for input(s): VANCOTROUGH, VANCOPEAK, VANCORANDOM, GENTTROUGH, GENTPEAK, GENTRANDOM, TOBRATROUGH, TOBRAPEAK, TOBRARND, AMIKACINPEAK, AMIKACINTROU, AMIKACIN in the last 72 hours.   Microbiology: No results found for this or any previous visit (from the past 720 hour(s)).  Medical History: Past Medical History  Diagnosis Date  . Quadriplegia   . Depression   . Anemia   . Parkinson's disease   . Seizures   . Adrenal insufficiency   . Dysphagia 06/19/2013  . Aphasia 06/19/2013    Medications:   (Not in a hospital admission) Scheduled:     Assessment: 58yo male presents with fever for 1 week. Pharmacy is consulted to dose vancomycin and cefepime for sepsis. Pt is febrile to 100, WBC 11.8, sCr 0.46 with CrCl ~ 100 mL/min. Pt does have allergy to penicillin, but has tolerated cephalosporins in the past.  Goal of Therapy:  Vancomycin trough level 15-20 mcg/ml  Plan:  Vancomycin 1000 mg IV q8h Cefepime 1g IV q8h Measure antibiotic drug levels at steady state Follow up culture results, renal function, and clinical course  Andrey Cota. Diona Foley, PharmD Clinical Pharmacist Pager  [phone] 03/10/2014,8:30 PM

## 2014-03-11 ENCOUNTER — Encounter: Payer: Self-pay | Admitting: Internal Medicine

## 2014-03-11 ENCOUNTER — Emergency Department (HOSPITAL_COMMUNITY): Payer: Medicare Other

## 2014-03-11 ENCOUNTER — Encounter (HOSPITAL_COMMUNITY): Payer: Self-pay

## 2014-03-11 DIAGNOSIS — A0472 Enterocolitis due to Clostridium difficile, not specified as recurrent: Secondary | ICD-10-CM | POA: Insufficient documentation

## 2014-03-11 DIAGNOSIS — Z79899 Other long term (current) drug therapy: Secondary | ICD-10-CM | POA: Diagnosis not present

## 2014-03-11 DIAGNOSIS — A047 Enterocolitis due to Clostridium difficile: Secondary | ICD-10-CM | POA: Diagnosis not present

## 2014-03-11 DIAGNOSIS — Z880 Allergy status to penicillin: Secondary | ICD-10-CM | POA: Diagnosis not present

## 2014-03-11 DIAGNOSIS — F329 Major depressive disorder, single episode, unspecified: Secondary | ICD-10-CM | POA: Diagnosis present

## 2014-03-11 DIAGNOSIS — R6510 Systemic inflammatory response syndrome (SIRS) of non-infectious origin without acute organ dysfunction: Secondary | ICD-10-CM | POA: Diagnosis present

## 2014-03-11 DIAGNOSIS — R1310 Dysphagia, unspecified: Secondary | ICD-10-CM | POA: Diagnosis not present

## 2014-03-11 DIAGNOSIS — R5383 Other fatigue: Secondary | ICD-10-CM | POA: Diagnosis not present

## 2014-03-11 DIAGNOSIS — Z931 Gastrostomy status: Secondary | ICD-10-CM

## 2014-03-11 DIAGNOSIS — Z888 Allergy status to other drugs, medicaments and biological substances status: Secondary | ICD-10-CM | POA: Diagnosis not present

## 2014-03-11 DIAGNOSIS — J219 Acute bronchiolitis, unspecified: Secondary | ICD-10-CM

## 2014-03-11 DIAGNOSIS — G20 Parkinson's disease: Secondary | ICD-10-CM

## 2014-03-11 DIAGNOSIS — Z22322 Carrier or suspected carrier of Methicillin resistant Staphylococcus aureus: Secondary | ICD-10-CM | POA: Diagnosis not present

## 2014-03-11 DIAGNOSIS — J440 Chronic obstructive pulmonary disease with acute lower respiratory infection: Secondary | ICD-10-CM | POA: Diagnosis present

## 2014-03-11 DIAGNOSIS — E876 Hypokalemia: Secondary | ICD-10-CM | POA: Diagnosis not present

## 2014-03-11 DIAGNOSIS — R569 Unspecified convulsions: Secondary | ICD-10-CM

## 2014-03-11 DIAGNOSIS — E119 Type 2 diabetes mellitus without complications: Secondary | ICD-10-CM | POA: Insufficient documentation

## 2014-03-11 DIAGNOSIS — A419 Sepsis, unspecified organism: Secondary | ICD-10-CM

## 2014-03-11 DIAGNOSIS — G8250 Quadriplegia, unspecified: Secondary | ICD-10-CM

## 2014-03-11 DIAGNOSIS — R509 Fever, unspecified: Secondary | ICD-10-CM | POA: Diagnosis not present

## 2014-03-11 DIAGNOSIS — R4701 Aphasia: Secondary | ICD-10-CM | POA: Diagnosis not present

## 2014-03-11 DIAGNOSIS — D638 Anemia in other chronic diseases classified elsewhere: Secondary | ICD-10-CM | POA: Diagnosis present

## 2014-03-11 DIAGNOSIS — G43A0 Cyclical vomiting, not intractable: Secondary | ICD-10-CM | POA: Diagnosis not present

## 2014-03-11 DIAGNOSIS — Z794 Long term (current) use of insulin: Secondary | ICD-10-CM | POA: Diagnosis not present

## 2014-03-11 DIAGNOSIS — E2740 Unspecified adrenocortical insufficiency: Secondary | ICD-10-CM

## 2014-03-11 DIAGNOSIS — R112 Nausea with vomiting, unspecified: Secondary | ICD-10-CM

## 2014-03-11 DIAGNOSIS — S065X0D Traumatic subdural hemorrhage without loss of consciousness, subsequent encounter: Secondary | ICD-10-CM | POA: Diagnosis not present

## 2014-03-11 HISTORY — DX: Type 2 diabetes mellitus without complications: E11.9

## 2014-03-11 LAB — INFLUENZA PANEL BY PCR (TYPE A & B)
H1N1 flu by pcr: NOT DETECTED
Influenza A By PCR: NEGATIVE
Influenza B By PCR: NEGATIVE

## 2014-03-11 LAB — CBC WITH DIFFERENTIAL/PLATELET
BASOS ABS: 0 10*3/uL (ref 0.0–0.1)
Basophils Relative: 0 % (ref 0–1)
EOS ABS: 0.5 10*3/uL (ref 0.0–0.7)
Eosinophils Relative: 6 % — ABNORMAL HIGH (ref 0–5)
HEMATOCRIT: 28.8 % — AB (ref 39.0–52.0)
Hemoglobin: 9.3 g/dL — ABNORMAL LOW (ref 13.0–17.0)
LYMPHS PCT: 22 % (ref 12–46)
Lymphs Abs: 1.9 10*3/uL (ref 0.7–4.0)
MCH: 24.3 pg — AB (ref 26.0–34.0)
MCHC: 32.3 g/dL (ref 30.0–36.0)
MCV: 75.2 fL — ABNORMAL LOW (ref 78.0–100.0)
MONO ABS: 0.6 10*3/uL (ref 0.1–1.0)
Monocytes Relative: 7 % (ref 3–12)
Neutro Abs: 5.5 10*3/uL (ref 1.7–7.7)
Neutrophils Relative %: 65 % (ref 43–77)
PLATELETS: 277 10*3/uL (ref 150–400)
RBC: 3.83 MIL/uL — ABNORMAL LOW (ref 4.22–5.81)
RDW: 14.4 % (ref 11.5–15.5)
WBC: 8.4 10*3/uL (ref 4.0–10.5)

## 2014-03-11 LAB — STREP PNEUMONIAE URINARY ANTIGEN: Strep Pneumo Urinary Antigen: NEGATIVE

## 2014-03-11 LAB — COMPREHENSIVE METABOLIC PANEL
ALBUMIN: 2.8 g/dL — AB (ref 3.5–5.2)
ALK PHOS: 151 U/L — AB (ref 39–117)
ALT: 10 U/L (ref 0–53)
AST: 25 U/L (ref 0–37)
Anion gap: 11 (ref 5–15)
BUN: 20 mg/dL (ref 6–23)
CHLORIDE: 102 meq/L (ref 96–112)
CO2: 26 meq/L (ref 19–32)
CREATININE: 0.42 mg/dL — AB (ref 0.50–1.35)
Calcium: 8.9 mg/dL (ref 8.4–10.5)
GFR calc Af Amer: >90 mL/min (ref >90)
Glucose, Bld: 91 mg/dL (ref 70–99)
POTASSIUM: 3.7 meq/L (ref 3.7–5.3)
Sodium: 139 mEq/L (ref 137–147)
Total Bilirubin: 0.5 mg/dL (ref 0.3–1.2)
Total Protein: 8.1 g/dL (ref 6.0–8.3)

## 2014-03-11 LAB — CLOSTRIDIUM DIFFICILE BY PCR: Toxigenic C. Difficile by PCR: POSITIVE — AB

## 2014-03-11 LAB — PHENYTOIN LEVEL, TOTAL: Phenytoin Lvl: 16.9 ug/mL (ref 10.0–20.0)

## 2014-03-11 LAB — GLUCOSE, CAPILLARY
GLUCOSE-CAPILLARY: 87 mg/dL (ref 70–99)
GLUCOSE-CAPILLARY: 120 mg/dL — AB (ref 70–99)
Glucose-Capillary: 78 mg/dL (ref 70–99)
Glucose-Capillary: 113 mg/dL — ABNORMAL HIGH (ref 70–99)

## 2014-03-11 LAB — MRSA PCR SCREENING: MRSA BY PCR: POSITIVE — AB

## 2014-03-11 MED ORDER — HYDROCORTISONE NA SUCCINATE PF 100 MG IJ SOLR
100.0000 mg | Freq: Three times a day (TID) | INTRAMUSCULAR | Status: DC
  Administered 2014-03-11: 100 mg via INTRAVENOUS
  Filled 2014-03-11 (×4): qty 2

## 2014-03-11 MED ORDER — WHITE PETROLATUM GEL
Status: AC
  Administered 2014-03-11: 0.2
  Filled 2014-03-11: qty 5

## 2014-03-11 MED ORDER — FERROUS SULFATE 300 (60 FE) MG/5ML PO SYRP: 300.0000 mg | ORAL_SOLUTION | Freq: Every day | ORAL | Status: DC

## 2014-03-11 MED ORDER — SODIUM CHLORIDE 0.9 % IV SOLN
INTRAVENOUS | Status: DC
  Administered 2014-03-11: 03:00:00 via INTRAVENOUS

## 2014-03-11 MED ORDER — CHLORHEXIDINE GLUCONATE 0.12 % MT SOLN
15.0000 mL | Freq: Two times a day (BID) | OROMUCOSAL | Status: DC
  Administered 2014-03-11 – 2014-03-13 (×5): 15 mL via OROMUCOSAL
  Filled 2014-03-11 (×7): qty 15

## 2014-03-11 MED ORDER — IOHEXOL 300 MG/ML  SOLN: 100.0000 mL | Freq: Once | INTRAMUSCULAR | Status: AC | PRN

## 2014-03-11 MED ORDER — INSULIN ASPART 100 UNIT/ML ~~LOC~~ SOLN
0.0000 [IU] | Freq: Every day | SUBCUTANEOUS | Status: DC
Start: 2014-03-11 — End: 2014-03-11

## 2014-03-11 MED ORDER — ENOXAPARIN SODIUM 40 MG/0.4ML ~~LOC~~ SOLN
40.0000 mg | Freq: Every day | SUBCUTANEOUS | Status: DC
  Filled 2014-03-11: qty 0.4

## 2014-03-11 MED ORDER — VANCOMYCIN 50 MG/ML ORAL SOLUTION: 125.0000 mg | Freq: Every day | ORAL | Status: DC

## 2014-03-11 MED ORDER — OLOPATADINE HCL 0.1 % OP SOLN
1.0000 [drp] | Freq: Every morning | OPHTHALMIC | Status: DC
  Administered 2014-03-11 – 2014-03-13 (×3): 1 [drp] via OPHTHALMIC
  Filled 2014-03-11: qty 5

## 2014-03-11 MED ORDER — PRO-STAT SUGAR FREE PO LIQD
30.0000 mL | Freq: Three times a day (TID) | ORAL | Status: DC
  Administered 2014-03-11 – 2014-03-13 (×4): 30 mL
  Filled 2014-03-11 (×6): qty 30

## 2014-03-11 MED ORDER — SODIUM CHLORIDE 0.9 % IV SOLN
INTRAVENOUS | Status: DC
  Administered 2014-03-12: 09:00:00 via INTRAVENOUS

## 2014-03-11 MED ORDER — PHENYTOIN 125 MG/5ML PO SUSP
125.0000 mg | Freq: Two times a day (BID) | ORAL | Status: DC
  Administered 2014-03-11: 125 mg
  Filled 2014-03-11 (×2): qty 5

## 2014-03-11 MED ORDER — INSULIN ASPART 100 UNIT/ML ~~LOC~~ SOLN
0.0000 [IU] | Freq: Three times a day (TID) | SUBCUTANEOUS | Status: DC
Start: 2014-03-11 — End: 2014-03-12

## 2014-03-11 MED ORDER — VANCOMYCIN 50 MG/ML ORAL SOLUTION: 125.0000 mg | ORAL | Status: DC

## 2014-03-11 MED ORDER — FERROUS SULFATE 300 (60 FE) MG/5ML PO SYRP
300.0000 mg | ORAL_SOLUTION | Freq: Every day | ORAL | Status: DC
  Administered 2014-03-11 – 2014-03-13 (×3): 300 mg
  Filled 2014-03-11 (×3): qty 5

## 2014-03-11 MED ORDER — PHENYTOIN 125 MG/5ML PO SUSP
100.0000 mg | Freq: Two times a day (BID) | ORAL | Status: DC
  Administered 2014-03-11 – 2014-03-13 (×4): 100 mg
  Filled 2014-03-11 (×5): qty 4

## 2014-03-11 MED ORDER — HYDROCORTISONE SOD SUCCINATE 100 MG PF FOR IT USE: 100.0000 mg | Freq: Three times a day (TID) | INTRAMUSCULAR | Status: DC

## 2014-03-11 MED ORDER — VANCOMYCIN 50 MG/ML ORAL SOLUTION: 125.0000 mg | Freq: Two times a day (BID) | ORAL | Status: DC

## 2014-03-11 MED ORDER — POLYVINYL ALCOHOL 1.4 % OP SOLN
1.0000 [drp] | Freq: Every day | OPHTHALMIC | Status: DC
  Administered 2014-03-11 – 2014-03-13 (×3): 1 [drp] via OPHTHALMIC
  Filled 2014-03-11: qty 15

## 2014-03-11 MED ORDER — FOLIC ACID 1 MG PO TABS
1.0000 mg | ORAL_TABLET | Freq: Every day | ORAL | Status: DC
  Administered 2014-03-11 – 2014-03-13 (×3): 1 mg
  Filled 2014-03-11 (×3): qty 1

## 2014-03-11 MED ORDER — BACLOFEN 10 MG PO TABS
10.0000 mg | ORAL_TABLET | Freq: Two times a day (BID) | ORAL | Status: DC
  Administered 2014-03-11 – 2014-03-13 (×5): 10 mg
  Filled 2014-03-11 (×6): qty 1

## 2014-03-11 MED ORDER — VANCOMYCIN 50 MG/ML ORAL SOLUTION: 125.0000 mg | Freq: Four times a day (QID) | ORAL | Status: DC

## 2014-03-11 MED ORDER — VANCOMYCIN 50 MG/ML ORAL SOLUTION
125.0000 mg | Freq: Four times a day (QID) | ORAL | Status: DC
  Administered 2014-03-11 – 2014-03-13 (×7): 125 mg
  Filled 2014-03-11 (×12): qty 2.5

## 2014-03-11 MED ORDER — HYDROCORTISONE NA SUCCINATE PF 100 MG IJ SOLR
50.0000 mg | Freq: Three times a day (TID) | INTRAMUSCULAR | Status: DC
  Administered 2014-03-11 – 2014-03-12 (×2): 50 mg via INTRAVENOUS
  Filled 2014-03-11 (×6): qty 1

## 2014-03-11 MED ORDER — JEVITY 1.2 CAL PO LIQD: 1000.0000 mL | ORAL | Status: DC

## 2014-03-11 MED ORDER — CEFEPIME HCL 1 G IJ SOLR: 1.0000 g | Freq: Three times a day (TID) | INTRAMUSCULAR | Status: DC

## 2014-03-11 MED ORDER — OSMOLITE 1.5 CAL PO LIQD
1000.0000 mL | ORAL | Status: DC
  Administered 2014-03-11 – 2014-03-13 (×2): 1000 mL
  Filled 2014-03-11 (×4): qty 1000

## 2014-03-11 MED ORDER — CETYLPYRIDINIUM CHLORIDE 0.05 % MT LIQD
7.0000 mL | Freq: Two times a day (BID) | OROMUCOSAL | Status: DC
  Administered 2014-03-11 – 2014-03-12 (×3): 7 mL via OROMUCOSAL

## 2014-03-11 NOTE — Progress Notes (Addendum)
PATIENT DETAILS Name: [name] Age: 58 y.o. Sex: male Date of Birth: 01-18-1956 Admit Date: 03/10/2014 Admitting Physician Lynden Oxford, MD FAO:[id number],HQIONGE Kellie Shropshire, MD  Subjective: No fever overnight. Foul smelling stools noted this am by this MD.   Assessment/Plan: Principal Problem: SIR's:no foci evident on admission, UA neg for UTI, CXR neg for PNA. C Diff PCR positive, since this is recurrent, will start oral Vanco, plan for 6 weeks taper. Will stop IV Vanco/Cefepime for now-and await culture results-recently had a PICC line and Serratia bacteremia.This patient could easily have more than one infection.  Active Problems: Recurrent C Diff Colitis: minimize IV Abx exposure, since no other source if infection evident-cultures pending-will stop IV Vanco/Cefepime. Plan on 6 weeks of tapering Vancomycin.Follow clinically.  Acute Encephalopathy:suspect secondary to above. Has recent hx of SDH, CT head on 12/14 with decreased hematoma burden.Not sure if current mentation is back to baseline.  Adrenal Insufficiency:on chronic steroids-currently on stress dose hydrocortisone-taper slowly  History of Seizures:Dilantin levels supratherapeutic after correction with albumin levels, appreciate pharmacy assistance. Dose reduced to 100 mg BID, recheck in a few days.   Nausea & vomiting:resolved with supportive care. Suspect secondary to SIR's/C Diff Colitis.   Anemia: secondary to chronic disease.   Chronic aphasia and Quadriplegia:hx of spastic quadriparesis and aphasia. Appears to be at baseline.   PEG (percutaneous endoscopic gastrostomy) status:resume peg feeds-will consult Nutrition  Disposition: Remain inpatient  Antibiotics:  See below   Anti-infectives    Start     Dose/Rate Route Frequency Ordered Stop   04/10/14 1000  vancomycin (VANCOCIN) 50 mg/mL oral solution 125 mg  Status:  Discontinued     125 mg Oral Every 3 DAYS 03/11/14 1420 03/11/14 1423   04/10/14  1000  vancomycin (VANCOCIN) 50 mg/mL oral solution 125 mg     125 mg Per Tube Every 3 DAYS 03/11/14 1423 04/25/14 0959   04/02/14 1000  vancomycin (VANCOCIN) 50 mg/mL oral solution 125 mg  Status:  Discontinued     125 mg Oral Every other day 03/11/14 1420 03/11/14 1423   04/02/14 1000  vancomycin (VANCOCIN) 50 mg/mL oral solution 125 mg     125 mg Per Tube Every other day 03/11/14 1423 04/10/14 0959   03/26/14 1000  vancomycin (VANCOCIN) 50 mg/mL oral solution 125 mg  Status:  Discontinued     125 mg Oral Daily 03/11/14 1420 03/11/14 1423   03/26/14 1000  vancomycin (VANCOCIN) 50 mg/mL oral solution 125 mg     125 mg Per Tube Daily 03/11/14 1423 04/02/14 0959   03/18/14 2200  vancomycin (VANCOCIN) 50 mg/mL oral solution 125 mg  Status:  Discontinued     125 mg Oral 2 times daily 03/11/14 1420 03/11/14 1423   03/18/14 2200  vancomycin (VANCOCIN) 50 mg/mL oral solution 125 mg     125 mg Per Tube 2 times daily 03/11/14 1423 03/25/14 2159   03/11/14 1430  vancomycin (VANCOCIN) 50 mg/mL oral solution 125 mg  Status:  Discontinued     125 mg Oral 4 times daily 03/11/14 1420 03/11/14 1423   03/11/14 1430  vancomycin (VANCOCIN) 50 mg/mL oral solution 125 mg     125 mg Per Tube 4 times daily 03/11/14 1423 03/18/14 1359   03/11/14 0600  ceFEPIme (MAXIPIME) 1 g in dextrose 5 % 50 mL IVPB  Status:  Discontinued     1 g100 mL/hr over 30 Minutes Intravenous 3 times per day 03/11/14 9528 03/11/14  1610   03/10/14 2100  vancomycin (VANCOCIN) IVPB 1000 mg/200 mL premix  Status:  Discontinued     1,000 mg200 mL/hr over 60 Minutes Intravenous Every 8 hours 03/10/14 2037 03/11/14 1420   03/10/14 2100  ceFEPIme (MAXIPIME) 1 g in dextrose 5 % 50 mL IVPB  Status:  Discontinued     1 g100 mL/hr over 30 Minutes Intravenous Every 8 hours 03/10/14 2053 03/11/14 1420      DVT Prophylaxis: SCD's-given recent hx of SDH  Code Status: Full code   Family Communication None at  bedside  Procedures:  None  CONSULTS:  None  Time spent 40 minutes-which includes 50% of the time with face-to-face with patient/ family and coordinating care related to the above assessment and plan.  MEDICATIONS: Scheduled Meds: . antiseptic oral rinse  7 mL Mouth Rinse q12n4p  . baclofen  10 mg Per Tube BID  . chlorhexidine  15 mL Mouth Rinse BID  . ferrous sulfate  300 mg Per Tube Daily  . folic acid  1 mg Per Tube Daily  . hydrocortisone sod succinate (SOLU-CORTEF) inj  50 mg Intravenous Q8H  . insulin aspart  0-5 Units Subcutaneous QHS  . insulin aspart  0-9 Units Subcutaneous TID WC  . phenytoin  100 mg Per Tube BID  . vancomycin  125 mg Per Tube QID   Followed by  . [START ON 03/18/2014] vancomycin  125 mg Per Tube BID   Followed by  . [START ON 03/26/2014] vancomycin  125 mg Per Tube Daily   Followed by  . [START ON 04/02/2014] vancomycin  125 mg Per Tube QODAY   Followed by  . [START ON 04/10/2014] vancomycin  125 mg Per Tube Q3 days   Continuous Infusions: . sodium chloride 75 mL/hr at 03/11/14 0420   PRN Meds:.    PHYSICAL EXAM: Vital signs in last 24 hours: Filed Vitals:   03/11/14 0200 03/11/14 0250 03/11/14 0511 03/11/14 1004  BP: 122/62 117/53 107/60 103/63  Pulse: 76 76 80 79  Temp:  97.8 F (36.6 C) 97.5 F (36.4 C) 98 F (36.7 C)  TempSrc:  Oral Oral Oral  Resp: 22 21 23 21   Weight:  75.3 kg (166 lb 0.1 oz)    SpO2: 95% 97% 97% 96%    Weight change:  Filed Weights   03/11/14 0250  Weight: 75.3 kg (166 lb 0.1 oz)   Body mass index is 21.3 kg/(m^2).   Gen Exam: Awake, follows commands, aphasic at baseline.  Neck: Supple, No JVD.   Chest: B/L Clear-anteriorly CVS: S1 S2 Regular, no murmurs.  Abdomen: soft, BS +, non tender, non distended. Peg in place Extremities: no edema, lower extremities warm to touch. Neurologic:quadriparesis.     Intake/Output from previous day:  Intake/Output Summary (Last 24 hours) at 03/11/14 1425 Last  data filed at 03/11/14 1050  Gross per 24 hour  Intake    300 ml  Output    500 ml  Net   -200 ml     LAB RESULTS: CBC  Recent Labs Lab 03/10/14 1841 03/11/14 0558  WBC 11.8* 8.4  HGB 10.2* 9.3*  HCT 31.6* 28.8*  PLT 300 277  MCV 73.1* 75.2*  MCH 23.6* 24.3*  MCHC 32.3 32.3  RDW 14.2 14.4  LYMPHSABS 2.2 1.9  MONOABS 0.8 0.6  EOSABS 0.6 0.5  BASOSABS 0.0 0.0    Chemistries   Recent Labs Lab 03/10/14 1841 03/11/14 0558  NA 138 139  K 3.7 3.7  CL 98 102  CO2 28 26  GLUCOSE 90 91  BUN 26* 20  CREATININE 0.46* 0.42*  CALCIUM 9.4 8.9    CBG:  Recent Labs Lab 03/11/14 1002 03/11/14 1213  GLUCAP 78 87    GFR Estimated Creatinine Clearance: 108.5 mL/min (by C-G formula based on Cr of 0.42).  Coagulation profile No results for input(s): INR, PROTIME in the last 168 hours.  Cardiac Enzymes No results for input(s): CKMB, TROPONINI, MYOGLOBIN in the last 168 hours.  Invalid input(s): CK  Invalid input(s): POCBNP No results for input(s): DDIMER in the last 72 hours. No results for input(s): HGBA1C in the last 72 hours. No results for input(s): CHOL, HDL, LDLCALC, TRIG, CHOLHDL, LDLDIRECT in the last 72 hours. No results for input(s): TSH, T4TOTAL, T3FREE, THYROIDAB in the last 72 hours.  Invalid input(s): FREET3 No results for input(s): VITAMINB12, FOLATE, FERRITIN, TIBC, IRON, RETICCTPCT in the last 72 hours. No results for input(s): LIPASE, AMYLASE in the last 72 hours.  Urine Studies No results for input(s): UHGB, CRYS in the last 72 hours.  Invalid input(s): UACOL, UAPR, USPG, UPH, UTP, UGL, UKET, UBIL, UNIT, UROB, ULEU, UEPI, UWBC, URBC, UBAC, CAST, UCOM, BILUA  MICROBIOLOGY: Recent Results (from the past 240 hour(s))  Blood culture (routine x 2)     Status: None (Preliminary result)   Collection Time: 03/10/14  7:58 PM  Result Value Ref Range Status   Specimen Description BLOOD LEFT  Final   Special Requests   Final    BOTTLES DRAWN  AEROBIC AND ANAEROBIC 5CC INTRAJUGLAR   Culture PENDING  Incomplete   Report Status PENDING  Incomplete  MRSA PCR Screening     Status: Abnormal   Collection Time: 03/11/14  4:32 AM  Result Value Ref Range Status   MRSA by PCR POSITIVE (A) NEGATIVE Final    Comment:        The GeneXpert MRSA Assay (FDA approved for NASAL specimens only), is one component of a comprehensive MRSA colonization surveillance program. It is not intended to diagnose MRSA infection nor to guide or monitor treatment for MRSA infections. RESULT CALLED TO, READ BACK BY AND VERIFIED WITH: CALLED TO RN Liborio Nixon TEMBRINA [id number] @0624  THANEY   Clostridium Difficile by PCR     Status: Abnormal   Collection Time: 03/11/14 10:56 AM  Result Value Ref Range Status   C difficile by pcr POSITIVE (A) NEGATIVE Final    Comment: CRITICAL RESULT CALLED TO, READ BACK BY AND VERIFIED WITH: ODEBERE RN 13:30 03/11/14 ([email or web address])     RADIOLOGY STUDIES/RESULTS: Dg Chest 1 View  03/10/2014   CLINICAL DATA:  Decreased level of consciousness.  Possible fever.  EXAM: CHEST - 1 VIEW  COMPARISON:  Single view of the chest 02/08/2014 and 04/14/2013.  FINDINGS: Lung volumes are low with mild basilar atelectasis. No consolidative process, pneumothorax or effusion is identified. Heart size is normal.  IMPRESSION: No acute finding in a low volume chest.   Electronically Signed   By: Drusilla Kanner M.D.   On: 03/10/2014 19:26   Ct Head Wo Contrast  03/11/2014   CLINICAL DATA:  Decreased level of consciousness with fever.  EXAM: CT HEAD WITHOUT CONTRAST  TECHNIQUE: Contiguous axial images were obtained from the base of the skull through the vertex without intravenous contrast.  COMPARISON:  01/26/2014  FINDINGS: Imaging is post-contrast only (related to previous abdominal CT).  Skull and Sinuses:No acute osseous findings.  There is new fluid levels within the right mastoid air  cells compatible with effusion. No erosive changes identified. Mild  mucosal thickening within the left sphenoid sinus, similar to previous. No paranasal sinus effusion.  Orbits: No acute abnormality.  Brain: A subdural hematoma around the left cerebral convexity is decreased in size, now 4 mm maximal. However, the high density component is mildly increased from previous, cannot exclude interval/acute hemorrhage. Midline shift persists but is decreased, currently 9 mm (previously 15 mm).  There is no evidence of acute infarct, hydrocephalus, or mass lesion.  Brain atrophy is diffuse and advanced. Gliosis and volume loss involving the bilateral putamen is unchanged. This is usually related to a remote toxic or metabolic insult.  These results were called by telephone at the time of interpretation on 03/11/2014 at 12:50 am to Dr. Rochele Raring , who verbally acknowledged these results.  IMPRESSION: 1. Decreased subdural hematoma around the left cerebral convexity compared to 01/26/2014, with decreasing midline shift. The volume of high-density fluid is mildly increased however, cannot exclude recent hemorrhage. 2. Right mastoiditis.   Electronically Signed   By: Tiburcio Pea M.D.   On: 03/11/2014 00:53   Ct Chest W Contrast  03/11/2014   CLINICAL DATA:  Tachypnea  EXAM: CT CHEST, ABDOMEN, AND PELVIS WITH CONTRAST  TECHNIQUE: Multidetector CT imaging of the chest, abdomen and pelvis was performed following the standard protocol during bolus administration of intravenous contrast.  CONTRAST:  100 cc Omnipaque 300 intravenous  COMPARISON:  Abdominal CT 01/26/2014  FINDINGS: CT CHEST FINDINGS  THORACIC INLET/BODY WALL:  Incidental 1 cm nodule in the left thyroid gland. No acute findings in the thoracic inlet or chest wall.  MEDIASTINUM:  No cardiomegaly or pericardial effusion. No acute vascular findings. Prominent right lower paratracheal lymph node at 11 mm short axis, of questionable clinical significance in isolation.  LUNG WINDOWS:  No consolidation. No effusion. No suspicious  pulmonary nodule (3 mm pulmonary nodule in the right lung on image 38 is of doubtful clinical significance).  OSSEOUS:  No acute fracture.  No suspicious lytic or blastic lesions.  CT ABDOMEN AND PELVIS FINDINGS  BODY WALL: Thickened appearance of the right abdominal wall is likely related to patient positioning. Similar appearance is noted on previous studies.  Liver: No focal abnormality.  Biliary: No evidence of biliary obstruction or stone.  Pancreas: Unremarkable.  Spleen: Unremarkable.  Adrenals: Unremarkable.  Kidneys and ureters: No hydronephrosis or stone. Left renal cyst measuring up to 2.5 cm in the interpolar region.  Bladder: Marked circumferential thickening of the bladder with mild surrounding fat haziness.  Reproductive: Unremarkable.  Bowel: Circumferential thickening of the distal rectum. Mesorectal fat edema is mildly decreased from previous. There is no evidence of abscess or extraluminal gas. No bowel obstruction. Filling defect at the cecum is likely stool given absence on previous study. There is a percutaneous gastrostomy tube in good position.  Retroperitoneum: No mass or adenopathy.  Peritoneum: No ascites or pneumoperitoneum.  Vascular: No acute abnormality.  OSSEOUS: L3 superior endplate fracture which is new since 01/26/2014. Height loss is minimal (less than 25%). There are remote superior endplate fractures of T11, T12, and L5.  IMPRESSION: 1. Proctitis findings are mildly improved since 01/26/2014. 2. Chronic bladder wall thickening. Please correlate with urinalysis to exclude an acute cystitis. 3. L3 superior endplate fracture which is new since 01/26/2014. Height loss is mild. 4. No intrathoracic findings to explain tachypnea.   Electronically Signed   By: Tiburcio Pea M.D.   On: 03/11/2014 00:44   Ct Abdomen Pelvis  W Contrast  03/11/2014   CLINICAL DATA:  Tachypnea  EXAM: CT CHEST, ABDOMEN, AND PELVIS WITH CONTRAST  TECHNIQUE: Multidetector CT imaging of the chest, abdomen  and pelvis was performed following the standard protocol during bolus administration of intravenous contrast.  CONTRAST:  100 cc Omnipaque 300 intravenous  COMPARISON:  Abdominal CT 01/26/2014  FINDINGS: CT CHEST FINDINGS  THORACIC INLET/BODY WALL:  Incidental 1 cm nodule in the left thyroid gland. No acute findings in the thoracic inlet or chest wall.  MEDIASTINUM:  No cardiomegaly or pericardial effusion. No acute vascular findings. Prominent right lower paratracheal lymph node at 11 mm short axis, of questionable clinical significance in isolation.  LUNG WINDOWS:  No consolidation. No effusion. No suspicious pulmonary nodule (3 mm pulmonary nodule in the right lung on image 38 is of doubtful clinical significance).  OSSEOUS:  No acute fracture.  No suspicious lytic or blastic lesions.  CT ABDOMEN AND PELVIS FINDINGS  BODY WALL: Thickened appearance of the right abdominal wall is likely related to patient positioning. Similar appearance is noted on previous studies.  Liver: No focal abnormality.  Biliary: No evidence of biliary obstruction or stone.  Pancreas: Unremarkable.  Spleen: Unremarkable.  Adrenals: Unremarkable.  Kidneys and ureters: No hydronephrosis or stone. Left renal cyst measuring up to 2.5 cm in the interpolar region.  Bladder: Marked circumferential thickening of the bladder with mild surrounding fat haziness.  Reproductive: Unremarkable.  Bowel: Circumferential thickening of the distal rectum. Mesorectal fat edema is mildly decreased from previous. There is no evidence of abscess or extraluminal gas. No bowel obstruction. Filling defect at the cecum is likely stool given absence on previous study. There is a percutaneous gastrostomy tube in good position.  Retroperitoneum: No mass or adenopathy.  Peritoneum: No ascites or pneumoperitoneum.  Vascular: No acute abnormality.  OSSEOUS: L3 superior endplate fracture which is new since 01/26/2014. Height loss is minimal (less than 25%). There are  remote superior endplate fractures of T11, T12, and L5.  IMPRESSION: 1. Proctitis findings are mildly improved since 01/26/2014. 2. Chronic bladder wall thickening. Please correlate with urinalysis to exclude an acute cystitis. 3. L3 superior endplate fracture which is new since 01/26/2014. Height loss is mild. 4. No intrathoracic findings to explain tachypnea.   Electronically Signed   By: Tiburcio Pea M.D.   On: 03/11/2014 00:44    Jeoffrey Massed, MD  [facility] Pager:[phone]  If 7PM-7AM, please contact night-coverage [email or web address] Password [email or web address] 03/11/2014, 2:25 PM   LOS: 1 day

## 2014-03-11 NOTE — Assessment & Plan Note (Signed)
Dx 12/2013, acute on chronic,stable at this time

## 2014-03-11 NOTE — Progress Notes (Addendum)
New Admission Note  Arrival: via stretcher from ED Mental Orientation: mumbles; answers yes or no.  Telemetry: n/a Assessment: See flow sheet. IV: Left Hand 125 ml/hr NS Safety Measures: Side rails in place, fall risk assessment complete with patient verbalizing understanding of risks associated with falls. Pt verbalizes an understanding of how to use the call bell and to call for help before getting out of bed. Call bell within reach, bed in lowest position, non-skid socks applied. [address] [place] Orientation: Pt orientation to unit, room and routine. Information packet given to patient and safety video watched.  Admission armband ID verified with patient and in place.  Family: parents present at bedside.  Orders have been reviewed and implemented. Will continue to monitor and assist as needed.  Velora Mediate, RN 03/11/2014 2:45 AM

## 2014-03-11 NOTE — Progress Notes (Signed)
Phenytoin Consult  58 yo male admitted from [facility]F with decreased responsiveness, fever, N/V.  Pt is quadriplegic and has history of seizures, dysphagia and aphasia. He is on phenytoin PTA for seizures 125 mg po BID.  His admit phenytoin level, when corrected for hypoalbuminemia, corrects to 25.6 (supratherapeutic). Given his elevated level and signs of possible toxicity (N/V and lethargy), the dose will be reduced.   Prescriptions prior to admission  Medication Sig Dispense Refill Last Dose  . Amino Acids-Protein Hydrolys (FEEDING SUPPLEMENT, PRO-STAT SUGAR FREE 64,) LIQD Place 30 mLs into feeding tube 3 (three) times daily. 900 mL 0 Past Week at Unknown time  . baclofen (LIORESAL) 10 MG tablet Place 1 tablet (10 mg total) into feeding tube 3 (three) times daily. 30 each 0 unknown at unknown  . ferrous sulfate 220 (44 FE) MG/5ML solution Place 330 mg into feeding tube See admin instructions. Give 7.63ml  Per tube every morning   unknown at unknown  . folic acid (FOLVITE) 1 MG tablet 1 mg by PEG Tube route daily.   unknown at unknown  . furosemide (LASIX) 20 MG tablet 20 mg by PEG Tube route daily.   unknown at unknown  . hydrocortisone sodium succinate (SOLU-CORTEF) 100 MG SOLR injection Inject 1 mL (50 mg total) into the vein every 12 (twelve) hours. 1 each  03/10/2014 at Unknown time  . insulin aspart (NOVOLOG) 100 UNIT/ML injection Inject 2-12 Units into the skin daily. CBG's 70-150=0units, 151-200=2units, 201-250=4units, 251-300=6units,301-350=8units, 351-400=10units, if over 400 give 12 units.   uknown at Unknown time  . loratadine (CLARITIN) 10 MG tablet 10 mg by PEG Tube route daily.   unknown at unknown  . Multiple Vitamins-Minerals (CENTAMIN) LIQD 5 Tubes by PEG Tube route daily.   unknown at unknown  . Nutritional Supplements (FEEDING SUPPLEMENT, OSMOLITE 1.5 CAL,) LIQD Place 1,000 mLs into feeding tube continuous.  0 unknown at unknown  . olopatadine (PATANOL) 0.1 % ophthalmic solution Place  1 drop into both eyes every morning. 5 mL 12 unknown at unknown  . pantoprazole (PROTONIX) 40 MG tablet 40 mg by PEG Tube route daily.   unknown at unknown  . phenytoin (DILANTIN) 125 MG/5ML suspension Place 5 mLs (125 mg total) into feeding tube 2 (two) times daily. 237 mL 12 unknown at unknown  . polyvinyl alcohol (LIQUIFILM TEARS) 1.4 % ophthalmic solution Place 1 drop into both eyes daily. 15 mL 0 unknown at unknown  . saccharomyces boulardii (FLORASTOR) 250 MG capsule 250 mg by PEG Tube route 2 (two) times daily.   unknown at unknown  . Water For Irrigation, Sterile (FREE WATER) SOLN Place 250 mLs into feeding tube every 6 (six) hours.   unknown at unknown  . Dextrose-Sodium Chloride (DEXTROSE 5 % AND 0.45% NACL) infusion Inject 100 mL/hr into the vein continuous. (Patient not taking: Reported on 03/10/2014)   Not Taking at Unknown time  . ferrous sulfate 300 (60 FE) MG/5ML syrup Place 5 mLs (300 mg total) into feeding tube daily. (Patient not taking: Reported on 03/10/2014) 150 mL 3 Not Taking at Unknown time  . metroNIDAZOLE (FLAGYL) 50 mg/ml oral suspension Place 10 mLs (500 mg total) into feeding tube every 8 (eight) hours. (Patient not taking: Reported on 03/10/2014)   Not Taking at Unknown time  . ondansetron (ZOFRAN) 4 MG/2ML SOLN injection Inject 2 mLs (4 mg total) into the vein every 6 (six) hours as needed for nausea or vomiting. (Patient not taking: Reported on 03/10/2014) 2 mL 0 Not  Taking at Unknown time  . saccharomyces boulardii (FLORASTOR) 250 MG capsule Take 1 capsule (250 mg total) by mouth 2 (two) times daily. (Patient not taking: Reported on 03/10/2014)   Not Taking at Unknown time  . vancomycin (VANCOCIN) 50 mg/mL oral solution Place 2.5 mLs (125 mg total) into feeding tube every 6 (six) hours. (Patient not taking: Reported on 03/10/2014)   Not Taking at Unknown time    Plan -Reduce dose to 100 mg po bid -Monitor closely for any seizure activity  -Monitor for improved  responsiveness and resolution of N/V -Recheck level as indicated   Hughes Better, PharmD, BCPS Clinical Pharmacist Pager: [phone] 03/11/2014 2:04 PM

## 2014-03-11 NOTE — H&P (Signed)
[facility] History and Physical  Patient: [name]  ZOX:[id number]  DOB: [birth year]  DOS: the patient was seen and examined on 03/11/2014 PCP: [name], MD  Chief Complaint: Confusion and lethargy  HPI: [name] is a 58 y.o. male with Past medical history of quadriplegia, PEG tube status, Parkinson disease, seizures, adrenal insufficiency, recurrent dysphagia. The patient presents with complaints of one-week history of low-grade fever with lethargy and confusion. The history was obtained from patient's family as the patient is unable to provide any history. Patient's family mentions that since last one week he has been having increasing sleepiness with lethargy. He has been confused and sometimes has not been acting himself. He also had a low-grade fever. Yesterday he had an episode of vomiting. He does not have any diarrhea. He has a condom catheter and no indwelling Foley. There was no recent change in his medication reported.  The patient is coming from SNF. And at his baseline dependent for most of his ADL.  Review of Systems: as mentioned in the history of present illness.  A Comprehensive review of the other systems is negative.  Past Medical History  Diagnosis Date  . Quadriplegia   . Depression   . Anemia   . Parkinson's disease   . Seizures   . Adrenal insufficiency   . Dysphagia 06/19/2013  . Aphasia 06/19/2013   Past Surgical History  Procedure Laterality Date  . Peg tube placement    . Peripherally inserted central catheter insertion    . Tonsillectomy  1962   Social History:  reports that he has never smoked. He has never used smokeless tobacco. He reports that he does not drink alcohol or use illicit drugs.  Allergies  Allergen Reactions  . Aspirin Other (See Comments)    Unknown reaction; "blood doesn't clog too well" per mother.   . Nsaids Other (See Comments)    Unknown reaction  . Penicillins Hives and Other (See  Comments)    Unknown reaction.  Pt has tolerated cephalosporins in the past.    Family History  Problem Relation Age of Onset  . Hypothyroidism Mother   . Hypertension Mother   . Diabetes Father     Prior to Admission medications   Medication Sig Start Date End Date Taking? Authorizing Provider  Amino Acids-Protein Hydrolys (FEEDING SUPPLEMENT, PRO-STAT SUGAR FREE 64,) LIQD Place 30 mLs into feeding tube 3 (three) times daily. 01/28/14  Yes Starleen Arms, MD  baclofen (LIORESAL) 10 MG tablet Place 1 tablet (10 mg total) into feeding tube 3 (three) times daily. 01/28/14  Yes Leana Roe Elgergawy, MD  ferrous sulfate 220 (44 FE) MG/5ML solution Place 330 mg into feeding tube See admin instructions. Give 7.48ml  Per tube every morning   Yes Historical Provider, MD  folic acid (FOLVITE) 1 MG tablet 1 mg by PEG Tube route daily.   Yes Historical Provider, MD  furosemide (LASIX) 20 MG tablet 20 mg by PEG Tube route daily.   Yes Historical Provider, MD  hydrocortisone sodium succinate (SOLU-CORTEF) 100 MG SOLR injection Inject 1 mL (50 mg total) into the vein every 12 (twelve) hours. 01/28/14  Yes Leana Roe Elgergawy, MD  insulin aspart (NOVOLOG) 100 UNIT/ML injection Inject 2-12 Units into the skin daily. CBG's 70-150=0units, 151-200=2units, 201-250=4units, 251-300=6units,301-350=8units, 351-400=10units, if over 400 give 12 units.   Yes Historical Provider, MD  loratadine (CLARITIN) 10 MG tablet 10 mg by PEG Tube route daily.   Yes Historical Provider, MD  Multiple Vitamins-Minerals (  CENTAMIN) LIQD 5 Tubes by PEG Tube route daily.   Yes Historical Provider, MD  Nutritional Supplements (FEEDING SUPPLEMENT, OSMOLITE 1.5 CAL,) LIQD Place 1,000 mLs into feeding tube continuous. 01/28/14  Yes Leana Roe Elgergawy, MD  olopatadine (PATANOL) 0.1 % ophthalmic solution Place 1 drop into both eyes every morning. 01/28/14  Yes Leana Roe Elgergawy, MD  pantoprazole (PROTONIX) 40 MG tablet 40 mg by PEG Tube route  daily.   Yes Historical Provider, MD  phenytoin (DILANTIN) 125 MG/5ML suspension Place 5 mLs (125 mg total) into feeding tube 2 (two) times daily. 01/28/14  Yes Starleen Arms, MD  polyvinyl alcohol (LIQUIFILM TEARS) 1.4 % ophthalmic solution Place 1 drop into both eyes daily. 01/28/14  Yes Starleen Arms, MD  saccharomyces boulardii (FLORASTOR) 250 MG capsule 250 mg by PEG Tube route 2 (two) times daily.   Yes Historical Provider, MD  Water For Irrigation, Sterile (FREE WATER) SOLN Place 250 mLs into feeding tube every 6 (six) hours. 01/28/14  Yes Starleen Arms, MD  Dextrose-Sodium Chloride (DEXTROSE 5 % AND 0.45% NACL) infusion Inject 100 mL/hr into the vein continuous. Patient not taking: Reported on 03/10/2014 01/28/14   Leana Roe Elgergawy, MD  ferrous sulfate 300 (60 FE) MG/5ML syrup Place 5 mLs (300 mg total) into feeding tube daily. Patient not taking: Reported on 03/10/2014 01/28/14   Leana Roe Elgergawy, MD  metroNIDAZOLE (FLAGYL) 50 mg/ml oral suspension Place 10 mLs (500 mg total) into feeding tube every 8 (eight) hours. Patient not taking: Reported on 03/10/2014 01/28/14   Leana Roe Elgergawy, MD  ondansetron [facility]) 4 MG/2ML SOLN injection Inject 2 mLs (4 mg total) into the vein every 6 (six) hours as needed for nausea or vomiting. Patient not taking: Reported on 03/10/2014 01/28/14   Leana Roe Elgergawy, MD  saccharomyces boulardii (FLORASTOR) 250 MG capsule Take 1 capsule (250 mg total) by mouth 2 (two) times daily. Patient not taking: Reported on 03/10/2014 01/28/14   Leana Roe Elgergawy, MD  vancomycin (VANCOCIN) 50 mg/mL oral solution Place 2.5 mLs (125 mg total) into feeding tube every 6 (six) hours. Patient not taking: Reported on 03/10/2014 01/28/14   Starleen Arms, MD    Physical Exam: Filed Vitals:   03/11/14 0144 03/11/14 0200 03/11/14 0250 03/11/14 0511  BP: 112/61 122/62 117/53 107/60  Pulse: 81 76 76 80  Temp:   97.8 F (36.6 C) 97.5 F (36.4 C)  TempSrc:    Oral Oral  Resp: 24 22 21 23   Weight:   75.3 kg (166 lb 0.1 oz)   SpO2: 94% 95% 97% 97%    General: Alert, Awake. Appear in mild distress Eyes: PERRL ENT: Oral Mucosa clear mois Cardiovascular: S1 and S2 Present, no Murmur, Peripheral Pulses Present Respiratory: Bilateral Air entry equal and Decreased, bilateral rhonchi and Crackles, no wheezes Abdomen: Bowel Sound present, Soft and no tender Skin: no Rash Extremities: no Pedal edema, no calf tenderness  Labs on Admission:  CBC:  Recent Labs Lab 03/10/14 1841 03/11/14 0558  WBC 11.8* 8.4  NEUTROABS 8.2* 5.5  HGB 10.2* 9.3*  HCT 31.6* 28.8*  MCV 73.1* 75.2*  PLT 300 277    CMP     Component Value Date/Time   NA 139 03/11/2014 0558   NA 134* 09/24/2013   K 3.7 03/11/2014 0558   CL 102 03/11/2014 0558   CO2 26 03/11/2014 0558   GLUCOSE 91 03/11/2014 0558   BUN 20 03/11/2014 0558   BUN 18 09/24/2013  CREATININE 0.42* 03/11/2014 0558   CREATININE 0.5* 09/24/2013   CALCIUM 8.9 03/11/2014 0558   PROT 8.1 03/11/2014 0558   ALBUMIN 2.8* 03/11/2014 0558   AST 25 03/11/2014 0558   ALT 10 03/11/2014 0558   ALKPHOS 151* 03/11/2014 0558   BILITOT 0.5 03/11/2014 0558   GFRNONAA >90 03/11/2014 0558   GFRAA >90 03/11/2014 0558    No results for input(s): LIPASE, AMYLASE in the last 168 hours. No results for input(s): AMMONIA in the last 168 hours.  No results for input(s): CKTOTAL, CKMB, CKMBINDEX, TROPONINI in the last 168 hours. BNP (last 3 results)  Recent Labs  01/25/14 2225  PROBNP 1776.0*    Radiological Exams on Admission: Dg Chest 1 View  03/10/2014   CLINICAL DATA:  Decreased level of consciousness.  Possible fever.  EXAM: CHEST - 1 VIEW  COMPARISON:  Single view of the chest 02/08/2014 and 04/14/2013.  FINDINGS: Lung volumes are low with mild basilar atelectasis. No consolidative process, pneumothorax or effusion is identified. Heart size is normal.  IMPRESSION: No acute finding in a low volume chest.    Electronically Signed   By: Drusilla Kanner M.D.   On: 03/10/2014 19:26   Ct Head Wo Contrast  03/11/2014   CLINICAL DATA:  Decreased level of consciousness with fever.  EXAM: CT HEAD WITHOUT CONTRAST  TECHNIQUE: Contiguous axial images were obtained from the base of the skull through the vertex without intravenous contrast.  COMPARISON:  01/26/2014  FINDINGS: Imaging is post-contrast only (related to previous abdominal CT).  Skull and Sinuses:No acute osseous findings.  There is new fluid levels within the right mastoid air cells compatible with effusion. No erosive changes identified. Mild mucosal thickening within the left sphenoid sinus, similar to previous. No paranasal sinus effusion.  Orbits: No acute abnormality.  Brain: A subdural hematoma around the left cerebral convexity is decreased in size, now 4 mm maximal. However, the high density component is mildly increased from previous, cannot exclude interval/acute hemorrhage. Midline shift persists but is decreased, currently 9 mm (previously 15 mm).  There is no evidence of acute infarct, hydrocephalus, or mass lesion.  Brain atrophy is diffuse and advanced. Gliosis and volume loss involving the bilateral putamen is unchanged. This is usually related to a remote toxic or metabolic insult.  These results were called by telephone at the time of interpretation on 03/11/2014 at 12:50 am to Dr. Rochele Raring , who verbally acknowledged these results.  IMPRESSION: 1. Decreased subdural hematoma around the left cerebral convexity compared to 01/26/2014, with decreasing midline shift. The volume of high-density fluid is mildly increased however, cannot exclude recent hemorrhage. 2. Right mastoiditis.   Electronically Signed   By: Tiburcio Pea M.D.   On: 03/11/2014 00:53   Ct Chest W Contrast  03/11/2014   CLINICAL DATA:  Tachypnea  EXAM: CT CHEST, ABDOMEN, AND PELVIS WITH CONTRAST  TECHNIQUE: Multidetector CT imaging of the chest, abdomen and pelvis was  performed following the standard protocol during bolus administration of intravenous contrast.  CONTRAST:  100 cc Omnipaque 300 intravenous  COMPARISON:  Abdominal CT 01/26/2014  FINDINGS: CT CHEST FINDINGS  THORACIC INLET/BODY WALL:  Incidental 1 cm nodule in the left thyroid gland. No acute findings in the thoracic inlet or chest wall.  MEDIASTINUM:  No cardiomegaly or pericardial effusion. No acute vascular findings. Prominent right lower paratracheal lymph node at 11 mm short axis, of questionable clinical significance in isolation.  LUNG WINDOWS:  No consolidation. No effusion. No suspicious  pulmonary nodule (3 mm pulmonary nodule in the right lung on image 38 is of doubtful clinical significance).  OSSEOUS:  No acute fracture.  No suspicious lytic or blastic lesions.  CT ABDOMEN AND PELVIS FINDINGS  BODY WALL: Thickened appearance of the right abdominal wall is likely related to patient positioning. Similar appearance is noted on previous studies.  Liver: No focal abnormality.  Biliary: No evidence of biliary obstruction or stone.  Pancreas: Unremarkable.  Spleen: Unremarkable.  Adrenals: Unremarkable.  Kidneys and ureters: No hydronephrosis or stone. Left renal cyst measuring up to 2.5 cm in the interpolar region.  Bladder: Marked circumferential thickening of the bladder with mild surrounding fat haziness.  Reproductive: Unremarkable.  Bowel: Circumferential thickening of the distal rectum. Mesorectal fat edema is mildly decreased from previous. There is no evidence of abscess or extraluminal gas. No bowel obstruction. Filling defect at the cecum is likely stool given absence on previous study. There is a percutaneous gastrostomy tube in good position.  Retroperitoneum: No mass or adenopathy.  Peritoneum: No ascites or pneumoperitoneum.  Vascular: No acute abnormality.  OSSEOUS: L3 superior endplate fracture which is new since 01/26/2014. Height loss is minimal (less than 25%). There are remote superior  endplate fractures of T11, T12, and L5.  IMPRESSION: 1. Proctitis findings are mildly improved since 01/26/2014. 2. Chronic bladder wall thickening. Please correlate with urinalysis to exclude an acute cystitis. 3. L3 superior endplate fracture which is new since 01/26/2014. Height loss is mild. 4. No intrathoracic findings to explain tachypnea.   Electronically Signed   By: Tiburcio Pea M.D.   On: 03/11/2014 00:44   Ct Abdomen Pelvis W Contrast  03/11/2014   CLINICAL DATA:  Tachypnea  EXAM: CT CHEST, ABDOMEN, AND PELVIS WITH CONTRAST  TECHNIQUE: Multidetector CT imaging of the chest, abdomen and pelvis was performed following the standard protocol during bolus administration of intravenous contrast.  CONTRAST:  100 cc Omnipaque 300 intravenous  COMPARISON:  Abdominal CT 01/26/2014  FINDINGS: CT CHEST FINDINGS  THORACIC INLET/BODY WALL:  Incidental 1 cm nodule in the left thyroid gland. No acute findings in the thoracic inlet or chest wall.  MEDIASTINUM:  No cardiomegaly or pericardial effusion. No acute vascular findings. Prominent right lower paratracheal lymph node at 11 mm short axis, of questionable clinical significance in isolation.  LUNG WINDOWS:  No consolidation. No effusion. No suspicious pulmonary nodule (3 mm pulmonary nodule in the right lung on image 38 is of doubtful clinical significance).  OSSEOUS:  No acute fracture.  No suspicious lytic or blastic lesions.  CT ABDOMEN AND PELVIS FINDINGS  BODY WALL: Thickened appearance of the right abdominal wall is likely related to patient positioning. Similar appearance is noted on previous studies.  Liver: No focal abnormality.  Biliary: No evidence of biliary obstruction or stone.  Pancreas: Unremarkable.  Spleen: Unremarkable.  Adrenals: Unremarkable.  Kidneys and ureters: No hydronephrosis or stone. Left renal cyst measuring up to 2.5 cm in the interpolar region.  Bladder: Marked circumferential thickening of the bladder with mild surrounding fat  haziness.  Reproductive: Unremarkable.  Bowel: Circumferential thickening of the distal rectum. Mesorectal fat edema is mildly decreased from previous. There is no evidence of abscess or extraluminal gas. No bowel obstruction. Filling defect at the cecum is likely stool given absence on previous study. There is a percutaneous gastrostomy tube in good position.  Retroperitoneum: No mass or adenopathy.  Peritoneum: No ascites or pneumoperitoneum.  Vascular: No acute abnormality.  OSSEOUS: L3 superior endplate fracture which  is new since 01/26/2014. Height loss is minimal (less than 25%). There are remote superior endplate fractures of T11, T12, and L5.  IMPRESSION: 1. Proctitis findings are mildly improved since 01/26/2014. 2. Chronic bladder wall thickening. Please correlate with urinalysis to exclude an acute cystitis. 3. L3 superior endplate fracture which is new since 01/26/2014. Height loss is mild. 4. No intrathoracic findings to explain tachypnea.   Electronically Signed   By: Tiburcio Pea M.D.   On: 03/11/2014 00:44     Assessment/Plan Principal Problem:   Acute bronchiolitis due to unspecified organism Active Problems:   Nausea & vomiting   Adrenal insufficiency   Quadriplegia   Parkinson's disease   Seizures   Dysphagia   Aphasia   PEG (percutaneous endoscopic gastrostomy) status   SIRS (systemic inflammatory response syndrome)   1. Acute bronchiolitis due to unspecified organism The patient is presenting with complaints of lethargy. Further workup including CT of the head, CT of the chest as well as CT abdomen pelvis does not show any evidence of acute abnormality. He appears to have double up acute bronchiolitis versus bronchitis due to bilateral rhonchi as well as respiratory distress. Due to his recent hospitalization I would treat him with IV vancomycin and cefepime and follow cultures. I would also use cautery device as needed. He will remain nothing by mouth until his  vomiting and nausea has resolved. And he starts having bowel movements.  2. Abdomen insufficiency. Patient will be given stress dose steroids.  3. History of COPD. Continue duo nebs.  4. History of seizure. Continue home medications  5. Lethargy and weakness. Probably secondary to overmedication with Flexeril and infection. At present reducing the frequency of Flexeril from tree times a day to twice a day. Patient recently had a subdural hematoma. CT scan shows resolution of the hematoma at present. Continues close monitoring  Advance goals of care discussion: Full code   DVT Prophylaxis: mechanical compression device Nutrition: Nothing by mouth  Family Communication: Family was present at bedside, opportunity was given to ask question and all questions were answered satisfactorily at the time of interview. Disposition: Admitted to inpatient in telemetry unit.  Author: Lynden Oxford, MD [facility] Pager: [phone] 03/11/2014, 7:22 AM    If 7PM-7AM, please contact night-coverage [email or web address] Password [email or web address]

## 2014-03-11 NOTE — Assessment & Plan Note (Signed)
Hb stable at 9.4. Need to continue iron but can d/c folate

## 2014-03-11 NOTE — Assessment & Plan Note (Signed)
[facility] vanc and flagyl until 11/25; had rectal tube in hospital which is d/c

## 2014-03-11 NOTE — Assessment & Plan Note (Signed)
Diet controlled.  

## 2014-03-11 NOTE — Assessment & Plan Note (Signed)
Continue dilantin.  

## 2014-03-11 NOTE — Assessment & Plan Note (Signed)
Treated with Na tablets BID for 3 days

## 2014-03-11 NOTE — Assessment & Plan Note (Signed)
2/2 to UTI and admitted to [facility] then [facility] cefapime until 11/14

## 2014-03-11 NOTE — Progress Notes (Signed)
INITIAL NUTRITION ASSESSMENT  DOCUMENTATION CODES Per approved criteria  -Not Applicable   INTERVENTION: Initiate Osmolite 1.5 @ 20 ml/hr via PEG and increase by 10 ml every 4 hours to goal rate of 55 ml/hr. Run for 20 hours daily. Turn off for one hour before and one hours after phenytoin dose.  Provide 30 ml Prostat TID per tube.  Tube Feeding regimen provides 1950 kcal (100% of estimated minimum needs, 114 grams of protein, and 838 ml of free water.  Once IV fluids are discontinued, provide free water flushes of 250 ml 4 times daily.  Will continue to monitor.  NUTRITION DIAGNOSIS: Inadequate oral intake related to inability to eat as evidenced by NPO status  Goal: Pt to meet >/= 90% of their estimated nutrition needs   Monitor:  TF initiation and tolerance, weight trends, labs, I/O's  Reason for Assessment: MD consult for enteral/tube feeding initiation and management  58 y.o. male  Admitting Dx: Acute bronchiolitis due to unspecified organism  ASSESSMENT: Pt with Past medical history of quadriplegia, PEG tube status, Parkinson disease, seizures, adrenal insufficiency, recurrent dysphagia. The patient presents with complaints of one-week history of low-grade fever with lethargy and confusion. C-diff positive.  No family at pt bedside. RD consulted for TF initiation and management. Pt is familiar to RD team due to prior hospital admission. Per Epic weight records, pt's weight has been stable. No nutrition focused physical exam performed due to quadriplegia.  Height: Ht Readings from Last 1 Encounters:  01/25/14 6\' 2"  (1.88 m)    Weight: Wt Readings from Last 1 Encounters:  03/11/14 166 lb 0.1 oz (75.3 kg)    Wt Readings from Last 10 Encounters:  03/11/14 166 lb 0.1 oz (75.3 kg)  01/27/14 167 lb 5.3 oz (75.901 kg)  01/17/14 174 lb 13.2 oz (79.3 kg)  01/02/14 155 lb 6.8 oz (70.5 kg)  12/11/13 155 lb 6.4 oz (70.489 kg)  10/30/13 156 lb 3.2 oz (70.852 kg)   07/23/13 156 lb (70.761 kg)  06/24/13 159 lb 9.6 oz (72.394 kg)  05/27/13 158 lb 9.6 oz (71.94 kg)  06/15/12 150 lb 2.1 oz (68.1 kg)    Usual Body Weight: 160 lbs  % Usual Body Weight: 104%  BMI:  Body mass index is 21.3 kg/(m^2).  Estimated Nutritional Needs: Kcal: 1900-2100 Protein: 105-115 grams Fluid: 1.9 - 2.1 L/day  Skin: intact  Diet Order: Diet NPO time specified  EDUCATION NEEDS: -Education not appropriate at this time   Intake/Output Summary (Last 24 hours) at 03/11/14 1540 Last data filed at 03/11/14 1050  Gross per 24 hour  Intake    300 ml  Output    500 ml  Net   -200 ml    Last BM: 12/15  Labs:   Recent Labs Lab 03/10/14 1841 03/11/14 0558  NA 138 139  K 3.7 3.7  CL 98 102  CO2 28 26  BUN 26* 20  CREATININE 0.46* 0.42*  CALCIUM 9.4 8.9  GLUCOSE 90 91    CBG (last 3)   Recent Labs  03/11/14 1002 03/11/14 1213  GLUCAP 78 87    Scheduled Meds: . antiseptic oral rinse  7 mL Mouth Rinse q12n4p  . baclofen  10 mg Per Tube BID  . chlorhexidine  15 mL Mouth Rinse BID  . ferrous sulfate  300 mg Per Tube Daily  . folic acid  1 mg Per Tube Daily  . hydrocortisone sod succinate (SOLU-CORTEF) inj  50 mg Intravenous Q8H  .  insulin aspart  0-9 Units Subcutaneous TID WC  . olopatadine  1 drop Both Eyes q morning - 10a  . phenytoin  100 mg Per Tube BID  . polyvinyl alcohol  1 drop Both Eyes Daily  . vancomycin  125 mg Per Tube QID   Followed by  . [START ON 03/18/2014] vancomycin  125 mg Per Tube BID   Followed by  . [START ON 03/26/2014] vancomycin  125 mg Per Tube Daily   Followed by  . [START ON 04/02/2014] vancomycin  125 mg Per Tube QODAY   Followed by  . [START ON 04/10/2014] vancomycin  125 mg Per Tube Q3 days    Continuous Infusions: . sodium chloride 75 mL/hr at 03/11/14 4037    Past Medical History  Diagnosis Date  . Quadriplegia   . Depression   . Anemia   . Parkinson's disease   . Seizures   . Adrenal insufficiency    . Dysphagia 06/19/2013  . Aphasia 06/19/2013    Past Surgical History  Procedure Laterality Date  . Peg tube placement    . Peripherally inserted central catheter insertion    . Tonsillectomy  1962    Kallie Locks, [place], RD, LDN Pager # [phone] After hours/ weekend pager # [phone]

## 2014-03-11 NOTE — Assessment & Plan Note (Signed)
Underlying problem with quad since 2004 2/2 non traumatic neuro damage 2/2 ETOH abuse

## 2014-03-12 DIAGNOSIS — G43A0 Cyclical vomiting, not intractable: Secondary | ICD-10-CM

## 2014-03-12 DIAGNOSIS — A047 Enterocolitis due to Clostridium difficile: Principal | ICD-10-CM

## 2014-03-12 LAB — GLUCOSE, CAPILLARY
GLUCOSE-CAPILLARY: 100 mg/dL — AB (ref 70–99)
Glucose-Capillary: 100 mg/dL — ABNORMAL HIGH (ref 70–99)
Glucose-Capillary: 85 mg/dL (ref 70–99)
Glucose-Capillary: 96 mg/dL (ref 70–99)
Glucose-Capillary: 98 mg/dL (ref 70–99)
Glucose-Capillary: 98 mg/dL (ref 70–99)

## 2014-03-12 LAB — BASIC METABOLIC PANEL
ANION GAP: 14 (ref 5–15)
BUN: 15 mg/dL (ref 6–23)
CALCIUM: 8.9 mg/dL (ref 8.4–10.5)
CHLORIDE: 103 meq/L (ref 96–112)
CO2: 23 mEq/L (ref 19–32)
CREATININE: 0.44 mg/dL — AB (ref 0.50–1.35)
GFR calc non Af Amer: >90 mL/min (ref >90)
Glucose, Bld: 90 mg/dL (ref 70–99)
Potassium: 3.2 mEq/L — ABNORMAL LOW (ref 3.7–5.3)
SODIUM: 140 meq/L (ref 137–147)

## 2014-03-12 LAB — CBC
HCT: 27.9 % — ABNORMAL LOW (ref 39.0–52.0)
Hemoglobin: 8.6 g/dL — ABNORMAL LOW (ref 13.0–17.0)
MCH: 22.4 pg — ABNORMAL LOW (ref 26.0–34.0)
MCHC: 30.8 g/dL (ref 30.0–36.0)
MCV: 72.7 fL — AB (ref 78.0–100.0)
PLATELETS: 274 10*3/uL (ref 150–400)
RBC: 3.84 MIL/uL — ABNORMAL LOW (ref 4.22–5.81)
RDW: 14.2 % (ref 11.5–15.5)
WBC: 7.3 10*3/uL (ref 4.0–10.5)

## 2014-03-12 LAB — URINE CULTURE
COLONY COUNT: NO GROWTH
CULTURE: NO GROWTH

## 2014-03-12 LAB — LEGIONELLA ANTIGEN, URINE

## 2014-03-12 MED ORDER — MUPIROCIN 2 % EX OINT
1.0000 "application " | TOPICAL_OINTMENT | Freq: Two times a day (BID) | CUTANEOUS | Status: DC
  Administered 2014-03-12 – 2014-03-13 (×3): 1 via NASAL
  Filled 2014-03-12: qty 22

## 2014-03-12 MED ORDER — POTASSIUM CHLORIDE 20 MEQ/15ML (10%) PO SOLN
40.0000 meq | Freq: Once | ORAL | Status: AC
  Administered 2014-03-12: 40 meq
  Filled 2014-03-12: qty 30

## 2014-03-12 MED ORDER — INSULIN ASPART 100 UNIT/ML ~~LOC~~ SOLN
0.0000 [IU] | SUBCUTANEOUS | Status: DC
Start: 2014-03-12 — End: 2014-03-13

## 2014-03-12 MED ORDER — HYDROCORTISONE NA SUCCINATE PF 100 MG IJ SOLR
25.0000 mg | Freq: Two times a day (BID) | INTRAMUSCULAR | Status: DC
  Administered 2014-03-12: 25 mg via INTRAVENOUS
  Filled 2014-03-12 (×2): qty 0.5

## 2014-03-12 MED ORDER — FREE WATER
250.0000 mL | Status: DC
  Administered 2014-03-12 – 2014-03-13 (×6): 250 mL

## 2014-03-12 MED ORDER — CHLORHEXIDINE GLUCONATE CLOTH 2 % EX PADS
6.0000 | MEDICATED_PAD | Freq: Every day | CUTANEOUS | Status: DC
  Administered 2014-03-13: 6 via TOPICAL

## 2014-03-12 MED ORDER — FAMOTIDINE 40 MG/5ML PO SUSR
20.0000 mg | Freq: Two times a day (BID) | ORAL | Status: DC
  Administered 2014-03-12 – 2014-03-13 (×3): 20 mg
  Filled 2014-03-12 (×6): qty 2.5

## 2014-03-12 MED ORDER — HYDROCORTISONE 5 MG/ML ORAL SUSPENSION
15.0000 mg | Freq: Every evening | ORAL | Status: DC
  Filled 2014-03-12: qty 3

## 2014-03-12 MED ORDER — HYDROCORTISONE 5 MG/ML ORAL SUSPENSION: 20.0000 mg | ORAL | Status: DC

## 2014-03-12 MED ORDER — HYDROCORTISONE 5 MG/ML ORAL SUSPENSION: 25.0000 mg | Freq: Every evening | ORAL | Status: DC

## 2014-03-12 MED ORDER — HYDROCORTISONE 5 MG/ML ORAL SUSPENSION
25.0000 mg | Freq: Every day | ORAL | Status: DC
  Administered 2014-03-13: 25 mg
  Filled 2014-03-12 (×2): qty 5

## 2014-03-12 NOTE — Progress Notes (Signed)
PATIENT DETAILS Name: [name] Age: 58 y.o. Sex: male Date of Birth: 11-16-1955 Admit Date: 03/10/2014 Admitting Physician Lynden Oxford, MD [id number] Kellie Shropshire, MD  Subjective: No fever overnight. Per RN no diarrhea. No major events overnight-non verbal at baseline.   Assessment/Plan: Principal Problem: SIR's:no foci evident on admission, UA neg for UTI, CXR neg for PNA. Noted to have foul smelling loose stool on 12/15-C Diff PCR positive, since this is recurrent, will start oral Vanco, plan for 6 weeks taper. Initially given recent hospitalization/Serratia bacteremia, and concern for PNA was started on IV Vanco/Cefepime on admission, this was discontinued. Current plans are to follow blood culture  results-and if neg for 48 hours, to discharge back to SNF on 12/17 as SIR's pathophysiology has resolved.  Active Problems: Recurrent C Diff Colitis: minimize Abx exposure, since no other source if infection evident-blood cultures negative so far, IV Vanco/Cefepime stopped on 12/15. Plan on 6 weeks of tapering Vancomycin.Follow clinically.  Acute Encephalopathy:suspect secondary to above. Has recent hx of SDH, CT head on 12/14 with decreased hematoma burden.Suspect that current mentation is baseline.  Adrenal Insufficiency:on chronic steroids-started on stress dose IV hydrocortisone-taper-will start Hydrocortisone via peg tube from 12/17  History of Seizures:Dilantin levels supratherapeutic after correction with albumin levels, appreciate pharmacy assistance. Dose reduced to 100 mg BID, recheck in a few days.   Nausea & vomiting:resolved with supportive care. Suspect secondary to SIR's/C Diff Colitis.   Hypokalemia:replete and recheck. Secondary to C Diff Colitis   Anemia: secondary to chronic disease.   Chronic aphasia and Quadriplegia:hx of spastic quadriparesis and aphasia. Appears to be at baseline.   PEG (percutaneous endoscopic gastrostomy) status:resume peg  feeds-will consult Nutrition  Disposition: Remain inpatient-back to SNF on 12/17 if cultures remain negative  Antibiotics:  See below   Anti-infectives    Start     Dose/Rate Route Frequency Ordered Stop   04/10/14 1000  vancomycin (VANCOCIN) 50 mg/mL oral solution 125 mg  Status:  Discontinued     125 mg Oral Every 3 DAYS 03/11/14 1420 03/11/14 1423   04/10/14 1000  vancomycin (VANCOCIN) 50 mg/mL oral solution 125 mg     125 mg Per Tube Every 3 DAYS 03/11/14 1423 04/25/14 0959   04/02/14 1000  vancomycin (VANCOCIN) 50 mg/mL oral solution 125 mg  Status:  Discontinued     125 mg Oral Every other day 03/11/14 1420 03/11/14 1423   04/02/14 1000  vancomycin (VANCOCIN) 50 mg/mL oral solution 125 mg     125 mg Per Tube Every other day 03/11/14 1423 04/10/14 0959   03/26/14 1000  vancomycin (VANCOCIN) 50 mg/mL oral solution 125 mg  Status:  Discontinued     125 mg Oral Daily 03/11/14 1420 03/11/14 1423   03/26/14 1000  vancomycin (VANCOCIN) 50 mg/mL oral solution 125 mg     125 mg Per Tube Daily 03/11/14 1423 04/02/14 0959   03/18/14 2200  vancomycin (VANCOCIN) 50 mg/mL oral solution 125 mg  Status:  Discontinued     125 mg Oral 2 times daily 03/11/14 1420 03/11/14 1423   03/18/14 2200  vancomycin (VANCOCIN) 50 mg/mL oral solution 125 mg     125 mg Per Tube 2 times daily 03/11/14 1423 03/25/14 2159   03/11/14 1430  vancomycin (VANCOCIN) 50 mg/mL oral solution 125 mg  Status:  Discontinued     125 mg Oral 4 times daily 03/11/14 1420 03/11/14 1423   03/11/14 1430  vancomycin (VANCOCIN) 50 mg/mL  oral solution 125 mg     125 mg Per Tube 4 times daily 03/11/14 1423 03/18/14 1359   03/11/14 0600  ceFEPIme (MAXIPIME) 1 g in dextrose 5 % 50 mL IVPB  Status:  Discontinued     1 g100 mL/hr over 30 Minutes Intravenous 3 times per day 03/11/14 9147 03/11/14 0357   03/10/14 2100  vancomycin (VANCOCIN) IVPB 1000 mg/200 mL premix  Status:  Discontinued     1,000 mg200 mL/hr over 60 Minutes Intravenous  Every 8 hours 03/10/14 2037 03/11/14 1420   03/10/14 2100  ceFEPIme (MAXIPIME) 1 g in dextrose 5 % 50 mL IVPB  Status:  Discontinued     1 g100 mL/hr over 30 Minutes Intravenous Every 8 hours 03/10/14 2053 03/11/14 1420      DVT Prophylaxis: SCD's-given recent hx of SDH  Code Status: Full code   Family Communication None at bedside  Procedures:  None  CONSULTS:  None  Time spent 40 minutes-which includes 50% of the time with face-to-face with patient/ family and coordinating care related to the above assessment and plan.  MEDICATIONS: Scheduled Meds: . antiseptic oral rinse  7 mL Mouth Rinse q12n4p  . baclofen  10 mg Per Tube BID  . chlorhexidine  15 mL Mouth Rinse BID  . famotidine  20 mg Per Tube BID  . feeding supplement (PRO-STAT SUGAR FREE 64)  30 mL Per Tube TID WC  . ferrous sulfate  300 mg Per Tube Daily  . folic acid  1 mg Per Tube Daily  . hydrocortisone sod succinate (SOLU-CORTEF) inj  50 mg Intravenous Q8H  . insulin aspart  0-9 Units Subcutaneous Q4H  . olopatadine  1 drop Both Eyes q morning - 10a  . phenytoin  100 mg Per Tube BID  . polyvinyl alcohol  1 drop Both Eyes Daily  . vancomycin  125 mg Per Tube QID   Followed by  . [START ON 03/18/2014] vancomycin  125 mg Per Tube BID   Followed by  . [START ON 03/26/2014] vancomycin  125 mg Per Tube Daily   Followed by  . [START ON 04/02/2014] vancomycin  125 mg Per Tube QODAY   Followed by  . [START ON 04/10/2014] vancomycin  125 mg Per Tube Q3 days   Continuous Infusions: . sodium chloride 75 mL/hr at 03/12/14 0837  . feeding supplement (OSMOLITE 1.5 CAL) 1,000 mL (03/12/14 0516)   PRN Meds:.    PHYSICAL EXAM: Vital signs in last 24 hours: Filed Vitals:   03/11/14 1645 03/11/14 2007 03/12/14 0417 03/12/14 0807  BP: 117/66 124/66 118/66 115/65  Pulse: 110 86 82 78  Temp: 98.3 F (36.8 C) 98.4 F (36.9 C) 98 F (36.7 C) 98.3 F (36.8 C)  TempSrc: Oral   Oral  Resp: 20 19 18 18   Weight:   68.947 kg (152 lb)    SpO2: 98% 97% 98% 99%    Weight change: -6.353 kg (-14 lb 0.1 oz) Filed Weights   03/11/14 0250 03/11/14 2007  Weight: 75.3 kg (166 lb 0.1 oz) 68.947 kg (152 lb)   Body mass index is 19.51 kg/(m^2).   Gen Exam: Awake, follows commands, aphasic at baseline. Grunting this am Neck: Supple, No JVD.   Chest: B/L Clear-anteriorly CVS: S1 S2 Regular, no murmurs.  Abdomen: soft, BS +, non tender, non distended. Peg in place Extremities: no edema, lower extremities warm to touch. Neurologic:quadriparesis.     Intake/Output from previous day:  Intake/Output Summary (Last 24 hours)  at 03/12/14 1131 Last data filed at 03/12/14 0841  Gross per 24 hour  Intake 2283.66 ml  Output    950 ml  Net 1333.66 ml     LAB RESULTS: CBC  Recent Labs Lab 03/10/14 1841 03/11/14 0558 03/12/14 0600  WBC 11.8* 8.4 7.3  HGB 10.2* 9.3* 8.6*  HCT 31.6* 28.8* 27.9*  PLT 300 277 274  MCV 73.1* 75.2* 72.7*  MCH 23.6* 24.3* 22.4*  MCHC 32.3 32.3 30.8  RDW 14.2 14.4 14.2  LYMPHSABS 2.2 1.9  --   MONOABS 0.8 0.6  --   EOSABS 0.6 0.5  --   BASOSABS 0.0 0.0  --     Chemistries   Recent Labs Lab 03/10/14 1841 03/11/14 0558 03/12/14 0600  NA 138 139 140  K 3.7 3.7 3.2*  CL 98 102 103  CO2 28 26 23   GLUCOSE 90 91 90  BUN 26* 20 15  CREATININE 0.46* 0.42* 0.44*  CALCIUM 9.4 8.9 8.9    CBG:  Recent Labs Lab 03/11/14 1213 03/11/14 1641 03/11/14 2006 03/12/14 0004 03/12/14 0419  GLUCAP 87 113* 120* 85 96    GFR Estimated Creatinine Clearance: 99.3 mL/min (by C-G formula based on Cr of 0.44).  Coagulation profile No results for input(s): INR, PROTIME in the last 168 hours.  Cardiac Enzymes No results for input(s): CKMB, TROPONINI, MYOGLOBIN in the last 168 hours.  Invalid input(s): CK  Invalid input(s): POCBNP No results for input(s): DDIMER in the last 72 hours. No results for input(s): HGBA1C in the last 72 hours. No results for input(s): CHOL,  HDL, LDLCALC, TRIG, CHOLHDL, LDLDIRECT in the last 72 hours. No results for input(s): TSH, T4TOTAL, T3FREE, THYROIDAB in the last 72 hours.  Invalid input(s): FREET3 No results for input(s): VITAMINB12, FOLATE, FERRITIN, TIBC, IRON, RETICCTPCT in the last 72 hours. No results for input(s): LIPASE, AMYLASE in the last 72 hours.  Urine Studies No results for input(s): UHGB, CRYS in the last 72 hours.  Invalid input(s): UACOL, UAPR, USPG, UPH, UTP, UGL, UKET, UBIL, UNIT, UROB, ULEU, UEPI, UWBC, URBC, UBAC, CAST, UCOM, BILUA  MICROBIOLOGY: Recent Results (from the past 240 hour(s))  Blood culture (routine x 2)     Status: None (Preliminary result)   Collection Time: 03/10/14  6:40 PM  Result Value Ref Range Status   Specimen Description BLOOD HAND RIGHT  Final   Special Requests BOTTLES DRAWN AEROBIC AND ANAEROBIC 5CC  Final   Culture  Setup Time   Final    03/11/2014 01:14 Performed at [facility]    Culture   Final           BLOOD CULTURE RECEIVED NO GROWTH TO DATE CULTURE WILL BE HELD FOR 5 DAYS BEFORE ISSUING A FINAL NEGATIVE REPORT Performed at [facility]    Report Status PENDING  Incomplete  Urine culture     Status: None   Collection Time: 03/10/14  7:22 PM  Result Value Ref Range Status   Specimen Description URINE, CATHETERIZED  Final   Special Requests ADDED [id number]  Final   Culture  Setup Time   Final    03/11/2014 03:50 Performed at [facility]    Colony Count NO GROWTH Performed at [facility]   Final   Culture NO GROWTH Performed at [facility]   Final   Report Status 03/12/2014 FINAL  Final  Blood culture (routine x 2)     Status: None (Preliminary result)  Collection Time: 03/10/14  7:58 PM  Result Value Ref Range Status   Specimen Description BLOOD LEFT  Final   Special Requests   Final    BOTTLES DRAWN AEROBIC AND ANAEROBIC 5CC INTRAJUGLAR   Culture  Setup Time   Final    03/11/2014 01:15 Performed at  [facility]    Culture   Final           BLOOD CULTURE RECEIVED NO GROWTH TO DATE CULTURE WILL BE HELD FOR 5 DAYS BEFORE ISSUING A FINAL NEGATIVE REPORT Performed at [facility]    Report Status PENDING  Incomplete  MRSA PCR Screening     Status: Abnormal   Collection Time: 03/11/14  4:32 AM  Result Value Ref Range Status   MRSA by PCR POSITIVE (A) NEGATIVE Final    Comment:        The GeneXpert MRSA Assay (FDA approved for NASAL specimens only), is one component of a comprehensive MRSA colonization surveillance program. It is not intended to diagnose MRSA infection nor to guide or monitor treatment for MRSA infections. RESULT CALLED TO, READ BACK BY AND VERIFIED WITH: CALLED TO RN Liborio Nixon [facility] [id number] @[id number]  THANEY   Clostridium Difficile by PCR     Status: Abnormal   Collection Time: 03/11/14 10:56 AM  Result Value Ref Range Status   C difficile by pcr POSITIVE (A) NEGATIVE Final    Comment: CRITICAL RESULT CALLED TO, READ BACK BY AND VERIFIED WITH: ODEBERE RN 13:30 03/11/14 ([email or web address])     RADIOLOGY STUDIES/RESULTS: Dg Chest 1 View  03/10/2014   CLINICAL DATA:  Decreased level of consciousness.  Possible fever.  EXAM: CHEST - 1 VIEW  COMPARISON:  Single view of the chest 02/08/2014 and 04/14/2013.  FINDINGS: Lung volumes are low with mild basilar atelectasis. No consolidative process, pneumothorax or effusion is identified. Heart size is normal.  IMPRESSION: No acute finding in a low volume chest.   Electronically Signed   By: Drusilla Kanner M.D.   On: 03/10/2014 19:26   Ct Head Wo Contrast  03/11/2014   CLINICAL DATA:  Decreased level of consciousness with fever.  EXAM: CT HEAD WITHOUT CONTRAST  TECHNIQUE: Contiguous axial images were obtained from the base of the skull through the vertex without intravenous contrast.  COMPARISON:  01/26/2014  FINDINGS: Imaging is post-contrast only (related to previous abdominal CT).  Skull and Sinuses:No acute osseous  findings.  There is new fluid levels within the right mastoid air cells compatible with effusion. No erosive changes identified. Mild mucosal thickening within the left sphenoid sinus, similar to previous. No paranasal sinus effusion.  Orbits: No acute abnormality.  Brain: A subdural hematoma around the left cerebral convexity is decreased in size, now 4 mm maximal. However, the high density component is mildly increased from previous, cannot exclude interval/acute hemorrhage. Midline shift persists but is decreased, currently 9 mm (previously 15 mm).  There is no evidence of acute infarct, hydrocephalus, or mass lesion.  Brain atrophy is diffuse and advanced. Gliosis and volume loss involving the bilateral putamen is unchanged. This is usually related to a remote toxic or metabolic insult.  These results were called by telephone at the time of interpretation on 03/11/2014 at 12:50 am to Dr. Rochele Raring , who verbally acknowledged these results.  IMPRESSION: 1. Decreased subdural hematoma around the left cerebral convexity compared to 01/26/2014, with decreasing midline shift. The volume of high-density fluid is mildly increased however, cannot exclude recent hemorrhage. 2. Right mastoiditis.  Electronically Signed   By: Tiburcio Pea M.D.   On: 03/11/2014 00:53   Ct Chest W Contrast  03/11/2014   CLINICAL DATA:  Tachypnea  EXAM: CT CHEST, ABDOMEN, AND PELVIS WITH CONTRAST  TECHNIQUE: Multidetector CT imaging of the chest, abdomen and pelvis was performed following the standard protocol during bolus administration of intravenous contrast.  CONTRAST:  100 cc Omnipaque 300 intravenous  COMPARISON:  Abdominal CT 01/26/2014  FINDINGS: CT CHEST FINDINGS  THORACIC INLET/BODY WALL:  Incidental 1 cm nodule in the left thyroid gland. No acute findings in the thoracic inlet or chest wall.  MEDIASTINUM:  No cardiomegaly or pericardial effusion. No acute vascular findings. Prominent right lower paratracheal lymph node  at 11 mm short axis, of questionable clinical significance in isolation.  LUNG WINDOWS:  No consolidation. No effusion. No suspicious pulmonary nodule (3 mm pulmonary nodule in the right lung on image 38 is of doubtful clinical significance).  OSSEOUS:  No acute fracture.  No suspicious lytic or blastic lesions.  CT ABDOMEN AND PELVIS FINDINGS  BODY WALL: Thickened appearance of the right abdominal wall is likely related to patient positioning. Similar appearance is noted on previous studies.  Liver: No focal abnormality.  Biliary: No evidence of biliary obstruction or stone.  Pancreas: Unremarkable.  Spleen: Unremarkable.  Adrenals: Unremarkable.  Kidneys and ureters: No hydronephrosis or stone. Left renal cyst measuring up to 2.5 cm in the interpolar region.  Bladder: Marked circumferential thickening of the bladder with mild surrounding fat haziness.  Reproductive: Unremarkable.  Bowel: Circumferential thickening of the distal rectum. Mesorectal fat edema is mildly decreased from previous. There is no evidence of abscess or extraluminal gas. No bowel obstruction. Filling defect at the cecum is likely stool given absence on previous study. There is a percutaneous gastrostomy tube in good position.  Retroperitoneum: No mass or adenopathy.  Peritoneum: No ascites or pneumoperitoneum.  Vascular: No acute abnormality.  OSSEOUS: L3 superior endplate fracture which is new since 01/26/2014. Height loss is minimal (less than 25%). There are remote superior endplate fractures of T11, T12, and L5.  IMPRESSION: 1. Proctitis findings are mildly improved since 01/26/2014. 2. Chronic bladder wall thickening. Please correlate with urinalysis to exclude an acute cystitis. 3. L3 superior endplate fracture which is new since 01/26/2014. Height loss is mild. 4. No intrathoracic findings to explain tachypnea.   Electronically Signed   By: Tiburcio Pea M.D.   On: 03/11/2014 00:44   Ct Abdomen Pelvis W Contrast  03/11/2014    CLINICAL DATA:  Tachypnea  EXAM: CT CHEST, ABDOMEN, AND PELVIS WITH CONTRAST  TECHNIQUE: Multidetector CT imaging of the chest, abdomen and pelvis was performed following the standard protocol during bolus administration of intravenous contrast.  CONTRAST:  100 cc Omnipaque 300 intravenous  COMPARISON:  Abdominal CT 01/26/2014  FINDINGS: CT CHEST FINDINGS  THORACIC INLET/BODY WALL:  Incidental 1 cm nodule in the left thyroid gland. No acute findings in the thoracic inlet or chest wall.  MEDIASTINUM:  No cardiomegaly or pericardial effusion. No acute vascular findings. Prominent right lower paratracheal lymph node at 11 mm short axis, of questionable clinical significance in isolation.  LUNG WINDOWS:  No consolidation. No effusion. No suspicious pulmonary nodule (3 mm pulmonary nodule in the right lung on image 38 is of doubtful clinical significance).  OSSEOUS:  No acute fracture.  No suspicious lytic or blastic lesions.  CT ABDOMEN AND PELVIS FINDINGS  BODY WALL: Thickened appearance of the right abdominal wall is likely related to  patient positioning. Similar appearance is noted on previous studies.  Liver: No focal abnormality.  Biliary: No evidence of biliary obstruction or stone.  Pancreas: Unremarkable.  Spleen: Unremarkable.  Adrenals: Unremarkable.  Kidneys and ureters: No hydronephrosis or stone. Left renal cyst measuring up to 2.5 cm in the interpolar region.  Bladder: Marked circumferential thickening of the bladder with mild surrounding fat haziness.  Reproductive: Unremarkable.  Bowel: Circumferential thickening of the distal rectum. Mesorectal fat edema is mildly decreased from previous. There is no evidence of abscess or extraluminal gas. No bowel obstruction. Filling defect at the cecum is likely stool given absence on previous study. There is a percutaneous gastrostomy tube in good position.  Retroperitoneum: No mass or adenopathy.  Peritoneum: No ascites or pneumoperitoneum.  Vascular: No acute  abnormality.  OSSEOUS: L3 superior endplate fracture which is new since 01/26/2014. Height loss is minimal (less than 25%). There are remote superior endplate fractures of T11, T12, and L5.  IMPRESSION: 1. Proctitis findings are mildly improved since 01/26/2014. 2. Chronic bladder wall thickening. Please correlate with urinalysis to exclude an acute cystitis. 3. L3 superior endplate fracture which is new since 01/26/2014. Height loss is mild. 4. No intrathoracic findings to explain tachypnea.   Electronically Signed   By: Tiburcio Pea M.D.   On: 03/11/2014 00:44    Jeoffrey Massed, MD  [facility] Pager:[phone]  If 7PM-7AM, please contact night-coverage [email or web address] Password [email or web address] 03/12/2014, 11:31 AM   LOS: 2 days

## 2014-03-13 LAB — GLUCOSE, CAPILLARY
GLUCOSE-CAPILLARY: 115 mg/dL — AB (ref 70–99)
GLUCOSE-CAPILLARY: 103 mg/dL — AB (ref 70–99)
Glucose-Capillary: 84 mg/dL (ref 70–99)

## 2014-03-13 MED ORDER — INSULIN ASPART 100 UNIT/ML ~~LOC~~ SOLN: SUBCUTANEOUS | Status: DC

## 2014-03-13 MED ORDER — HYDROCORTISONE 5 MG/ML ORAL SUSPENSION: 25.0000 mg | Freq: Every day | ORAL | Status: DC

## 2014-03-13 MED ORDER — PRO-STAT SUGAR FREE PO LIQD: 30.0000 mL | Freq: Three times a day (TID) | ORAL | Status: DC

## 2014-03-13 MED ORDER — FREE WATER
250.0000 mL | Status: DC
Start: 2014-03-13 — End: 2015-05-29

## 2014-03-13 MED ORDER — HYDROCORTISONE 5 MG/ML ORAL SUSPENSION: 15.0000 mg | Freq: Every evening | ORAL | Status: DC

## 2014-03-13 MED ORDER — VANCOMYCIN 50 MG/ML ORAL SOLUTION: 125.0000 mg | Freq: Four times a day (QID) | ORAL | Status: AC

## 2014-03-13 MED ORDER — VANCOMYCIN 50 MG/ML ORAL SOLUTION: 125.0000 mg | Freq: Every day | ORAL | Status: AC

## 2014-03-13 MED ORDER — VANCOMYCIN 50 MG/ML ORAL SOLUTION: 125.0000 mg | ORAL | Status: AC

## 2014-03-13 MED ORDER — FAMOTIDINE 40 MG/5ML PO SUSR: 20.0000 mg | Freq: Two times a day (BID) | ORAL | Status: DC

## 2014-03-13 MED ORDER — VANCOMYCIN 50 MG/ML ORAL SOLUTION: 125.0000 mg | Freq: Two times a day (BID) | ORAL | Status: AC

## 2014-03-13 MED ORDER — OSMOLITE 1.5 CAL PO LIQD
1000.0000 mL | ORAL | Status: DC
  Administered 2014-03-13: 1000 mL
  Filled 2014-03-13 (×2): qty 1000

## 2014-03-13 MED ORDER — VANCOMYCIN 50 MG/ML ORAL SOLUTION: 125.0000 mg | ORAL | Status: DC

## 2014-03-13 MED ORDER — OSMOLITE 1.5 CAL PO LIQD: 1000.0000 mL | ORAL | Status: DC

## 2014-03-13 MED ORDER — PHENYTOIN 125 MG/5ML PO SUSP: 100.0000 mg | Freq: Two times a day (BID) | ORAL | Status: DC

## 2014-03-13 MED ORDER — VANCOMYCIN 50 MG/ML ORAL SOLUTION: 125.0000 mg | Freq: Four times a day (QID) | ORAL | Status: DC

## 2014-03-13 MED ORDER — MUPIROCIN 2 % EX OINT: 1.0000 "application " | TOPICAL_OINTMENT | Freq: Two times a day (BID) | CUTANEOUS | Status: DC

## 2014-03-13 NOTE — Progress Notes (Signed)
Report called to [place] at [facility]. All questions answered.

## 2014-03-13 NOTE — Discharge Summary (Signed)
PATIENT DETAILS Name: [name] Age: 58 y.o. Sex: male Date of Birth: 06-04-1955 MRN: [id number]. Admitting Physician: Lynden Oxford, MD GUY:[id number] Kellie Shropshire, MD  Admit Date: 03/10/2014 Discharge date: 03/13/2014  Recommendations for Outpatient Follow-up:  1. Please check CBC and chemistries in 1 week 2. Vancomycin April-6 weeks 3. Judicious use of antibiotics in the future-has recurrent C. difficile colitis. 4. Please check Dilantin level in one week 5. Please follow-up blood cultures till final-negative at the time of discharge  PRIMARY DISCHARGE DIAGNOSIS:  Principal Problem:   Recurrent C. difficile colitis Active Problems:   Nausea & vomiting   Adrenal insufficiency   Quadriplegia   Parkinson's disease   Seizures   Dysphagia   Aphasia   PEG (percutaneous endoscopic gastrostomy) status   SIRS (systemic inflammatory response syndrome)      PAST MEDICAL HISTORY: Past Medical History  Diagnosis Date  . Quadriplegia   . Depression   . Anemia   . Parkinson's disease   . Seizures   . Adrenal insufficiency   . Dysphagia 06/19/2013  . Aphasia 06/19/2013    DISCHARGE MEDICATIONS: Current Discharge Medication List    START taking these medications   Details  famotidine (PEPCID) 40 MG/5ML suspension Place 2.5 mLs (20 mg total) into feeding tube 2 (two) times daily. Qty: 50 mL, Refills: 0    !! hydrocortisone (CORTEF) 5 mg/mL SUSP Take 3 mLs (15 mg total) by mouth every evening.    !! hydrocortisone (CORTEF) 5 mg/mL SUSP Place 5 mLs (25 mg total) into feeding tube daily at 6 (six) AM.    mupirocin ointment (BACTROBAN) 2 % Place 1 application into the nose 2 (two) times daily. For 4 more days from 03/13/14 Qty: 22 g, Refills: 0     !! - Potential duplicate medications found. Please discuss with provider.    CONTINUE these medications which have CHANGED   Details  Amino Acids-Protein Hydrolys (FEEDING SUPPLEMENT, PRO-STAT SUGAR FREE 64,) LIQD Place 30  mLs into feeding tube 3 (three) times daily with meals. Qty: 900 mL, Refills: 0    insulin aspart (NOVOLOG) 100 UNIT/ML injection insulin aspart (novoLOG) injection 0-9 Units 0-9 Units, Subcutaneous, Every 4 hours CBG < 70: implement hypoglycemia protocol CBG 70 - 120: 0 units CBG 121 - 150: 1 unit CBG 151 - 200: 2 units CBG 201 - 250: 3 units CBG 251 - 300: 5 units CBG 301 - 350: 7 units CBG 351 - 400: 9 units CBG > 400: call MD Qty: 10 mL, Refills: 11    Nutritional Supplements (FEEDING SUPPLEMENT, OSMOLITE 1.5 CAL,) LIQD Place 1,000 mLs into feeding tube continuous. Refills: 0    phenytoin (DILANTIN) 125 MG/5ML suspension Place 4 mLs (100 mg total) into feeding tube 2 (two) times daily. Qty: 237 mL, Refills: 12    !! vancomycin (VANCOCIN) 50 mg/mL oral solution Place 2.5 mLs (125 mg total) into feeding tube 2 (two) times daily. From 12/22 to 03/24/14    !! vancomycin (VANCOCIN) 50 mg/mL oral solution Place 2.5 mLs (125 mg total) into feeding tube daily. From 03/25/14 to 01/014/16    !! vancomycin (VANCOCIN) 50 mg/mL oral solution Place 2.5 mLs (125 mg total) into feeding tube every other day. From 04/01/14 to 04/07/14    !! vancomycin (VANCOCIN) 50 mg/mL oral solution Place 2.5 mLs (125 mg total) into feeding tube every 3 (three) days. For 14 days from 04/09/14    !! vancomycin (VANCOCIN) 50 mg/mL oral solution Place 2.5 mLs (125 mg total)  into feeding tube 4 (four) times daily. From 12/15 to 03/17/14    Water For Irrigation, Sterile (FREE WATER) SOLN Place 250 mLs into feeding tube every 4 (four) hours.     !! - Potential duplicate medications found. Please discuss with provider.    CONTINUE these medications which have NOT CHANGED   Details  baclofen (LIORESAL) 10 MG tablet Place 1 tablet (10 mg total) into feeding tube 3 (three) times daily. Qty: 30 each, Refills: 0    folic acid (FOLVITE) 1 MG tablet 1 mg by PEG Tube route daily.    loratadine (CLARITIN) 10 MG tablet  10 mg by PEG Tube route daily.    Multiple Vitamins-Minerals (CENTAMIN) LIQD 5 Tubes by PEG Tube route daily.    olopatadine (PATANOL) 0.1 % ophthalmic solution Place 1 drop into both eyes every morning. Qty: 5 mL, Refills: 12    polyvinyl alcohol (LIQUIFILM TEARS) 1.4 % ophthalmic solution Place 1 drop into both eyes daily. Qty: 15 mL, Refills: 0    saccharomyces boulardii (FLORASTOR) 250 MG capsule 250 mg by PEG Tube route 2 (two) times daily.    ferrous sulfate 220 (44 FE) MG/5ML solution Place 330 mg into feeding tube See admin instructions. Give 7.36ml  Per tube every morning      STOP taking these medications     furosemide (LASIX) 20 MG tablet      hydrocortisone sodium succinate (SOLU-CORTEF) 100 MG SOLR injection      pantoprazole (PROTONIX) 40 MG tablet      Dextrose-Sodium Chloride (DEXTROSE 5 % AND 0.45% NACL) infusion      ferrous sulfate 300 (60 FE) MG/5ML syrup      metroNIDAZOLE (FLAGYL) 50 mg/ml oral suspension      ondansetron (ZOFRAN) 4 MG/2ML SOLN injection         ALLERGIES:   Allergies  Allergen Reactions  . Aspirin Other (See Comments)    Unknown reaction; "blood doesn't clog too well" per mother.   . Nsaids Other (See Comments)    Unknown reaction  . Penicillins Hives and Other (See Comments)    Unknown reaction.  Pt has tolerated cephalosporins in the past.    BRIEF HPI:  See H&P, Labs, Consult and Test reports for all details in brief, patient is a 58 year old unfortunate male with a history of quadriplegia, nonverbal status, PEG tube, history of seizure disorder, adrenal insufficiency who presented with one-week history of lethargy, fever and mild confusion than usual baseline. Patient was admitted for further evaluation and treatment  CONSULTATIONS:   None  PERTINENT RADIOLOGIC STUDIES: Dg Chest 1 View  03/10/2014   CLINICAL DATA:  Decreased level of consciousness.  Possible fever.  EXAM: CHEST - 1 VIEW  COMPARISON:  Single view of the  chest 02/08/2014 and 04/14/2013.  FINDINGS: Lung volumes are low with mild basilar atelectasis. No consolidative process, pneumothorax or effusion is identified. Heart size is normal.  IMPRESSION: No acute finding in a low volume chest.   Electronically Signed   By: Drusilla Kanner M.D.   On: 03/10/2014 19:26   Ct Head Wo Contrast  03/11/2014   CLINICAL DATA:  Decreased level of consciousness with fever.  EXAM: CT HEAD WITHOUT CONTRAST  TECHNIQUE: Contiguous axial images were obtained from the base of the skull through the vertex without intravenous contrast.  COMPARISON:  01/26/2014  FINDINGS: Imaging is post-contrast only (related to previous abdominal CT).  Skull and Sinuses:No acute osseous findings.  There is new fluid levels within  the right mastoid air cells compatible with effusion. No erosive changes identified. Mild mucosal thickening within the left sphenoid sinus, similar to previous. No paranasal sinus effusion.  Orbits: No acute abnormality.  Brain: A subdural hematoma around the left cerebral convexity is decreased in size, now 4 mm maximal. However, the high density component is mildly increased from previous, cannot exclude interval/acute hemorrhage. Midline shift persists but is decreased, currently 9 mm (previously 15 mm).  There is no evidence of acute infarct, hydrocephalus, or mass lesion.  Brain atrophy is diffuse and advanced. Gliosis and volume loss involving the bilateral putamen is unchanged. This is usually related to a remote toxic or metabolic insult.  These results were called by telephone at the time of interpretation on 03/11/2014 at 12:50 am to Dr. Rochele Raring , who verbally acknowledged these results.  IMPRESSION: 1. Decreased subdural hematoma around the left cerebral convexity compared to 01/26/2014, with decreasing midline shift. The volume of high-density fluid is mildly increased however, cannot exclude recent hemorrhage. 2. Right mastoiditis.   Electronically Signed    By: Tiburcio Pea M.D.   On: 03/11/2014 00:53   Ct Chest W Contrast  03/11/2014   CLINICAL DATA:  Tachypnea  EXAM: CT CHEST, ABDOMEN, AND PELVIS WITH CONTRAST  TECHNIQUE: Multidetector CT imaging of the chest, abdomen and pelvis was performed following the standard protocol during bolus administration of intravenous contrast.  CONTRAST:  100 cc Omnipaque 300 intravenous  COMPARISON:  Abdominal CT 01/26/2014  FINDINGS: CT CHEST FINDINGS  THORACIC INLET/BODY WALL:  Incidental 1 cm nodule in the left thyroid gland. No acute findings in the thoracic inlet or chest wall.  MEDIASTINUM:  No cardiomegaly or pericardial effusion. No acute vascular findings. Prominent right lower paratracheal lymph node at 11 mm short axis, of questionable clinical significance in isolation.  LUNG WINDOWS:  No consolidation. No effusion. No suspicious pulmonary nodule (3 mm pulmonary nodule in the right lung on image 38 is of doubtful clinical significance).  OSSEOUS:  No acute fracture.  No suspicious lytic or blastic lesions.  CT ABDOMEN AND PELVIS FINDINGS  BODY WALL: Thickened appearance of the right abdominal wall is likely related to patient positioning. Similar appearance is noted on previous studies.  Liver: No focal abnormality.  Biliary: No evidence of biliary obstruction or stone.  Pancreas: Unremarkable.  Spleen: Unremarkable.  Adrenals: Unremarkable.  Kidneys and ureters: No hydronephrosis or stone. Left renal cyst measuring up to 2.5 cm in the interpolar region.  Bladder: Marked circumferential thickening of the bladder with mild surrounding fat haziness.  Reproductive: Unremarkable.  Bowel: Circumferential thickening of the distal rectum. Mesorectal fat edema is mildly decreased from previous. There is no evidence of abscess or extraluminal gas. No bowel obstruction. Filling defect at the cecum is likely stool given absence on previous study. There is a percutaneous gastrostomy tube in good position.  Retroperitoneum: No  mass or adenopathy.  Peritoneum: No ascites or pneumoperitoneum.  Vascular: No acute abnormality.  OSSEOUS: L3 superior endplate fracture which is new since 01/26/2014. Height loss is minimal (less than 25%). There are remote superior endplate fractures of T11, T12, and L5.  IMPRESSION: 1. Proctitis findings are mildly improved since 01/26/2014. 2. Chronic bladder wall thickening. Please correlate with urinalysis to exclude an acute cystitis. 3. L3 superior endplate fracture which is new since 01/26/2014. Height loss is mild. 4. No intrathoracic findings to explain tachypnea.   Electronically Signed   By: Tiburcio Pea M.D.   On: 03/11/2014 00:44  Ct Abdomen Pelvis W Contrast  03/11/2014   CLINICAL DATA:  Tachypnea  EXAM: CT CHEST, ABDOMEN, AND PELVIS WITH CONTRAST  TECHNIQUE: Multidetector CT imaging of the chest, abdomen and pelvis was performed following the standard protocol during bolus administration of intravenous contrast.  CONTRAST:  100 cc Omnipaque 300 intravenous  COMPARISON:  Abdominal CT 01/26/2014  FINDINGS: CT CHEST FINDINGS  THORACIC INLET/BODY WALL:  Incidental 1 cm nodule in the left thyroid gland. No acute findings in the thoracic inlet or chest wall.  MEDIASTINUM:  No cardiomegaly or pericardial effusion. No acute vascular findings. Prominent right lower paratracheal lymph node at 11 mm short axis, of questionable clinical significance in isolation.  LUNG WINDOWS:  No consolidation. No effusion. No suspicious pulmonary nodule (3 mm pulmonary nodule in the right lung on image 38 is of doubtful clinical significance).  OSSEOUS:  No acute fracture.  No suspicious lytic or blastic lesions.  CT ABDOMEN AND PELVIS FINDINGS  BODY WALL: Thickened appearance of the right abdominal wall is likely related to patient positioning. Similar appearance is noted on previous studies.  Liver: No focal abnormality.  Biliary: No evidence of biliary obstruction or stone.  Pancreas: Unremarkable.  Spleen:  Unremarkable.  Adrenals: Unremarkable.  Kidneys and ureters: No hydronephrosis or stone. Left renal cyst measuring up to 2.5 cm in the interpolar region.  Bladder: Marked circumferential thickening of the bladder with mild surrounding fat haziness.  Reproductive: Unremarkable.  Bowel: Circumferential thickening of the distal rectum. Mesorectal fat edema is mildly decreased from previous. There is no evidence of abscess or extraluminal gas. No bowel obstruction. Filling defect at the cecum is likely stool given absence on previous study. There is a percutaneous gastrostomy tube in good position.  Retroperitoneum: No mass or adenopathy.  Peritoneum: No ascites or pneumoperitoneum.  Vascular: No acute abnormality.  OSSEOUS: L3 superior endplate fracture which is new since 01/26/2014. Height loss is minimal (less than 25%). There are remote superior endplate fractures of T11, T12, and L5.  IMPRESSION: 1. Proctitis findings are mildly improved since 01/26/2014. 2. Chronic bladder wall thickening. Please correlate with urinalysis to exclude an acute cystitis. 3. L3 superior endplate fracture which is new since 01/26/2014. Height loss is mild. 4. No intrathoracic findings to explain tachypnea.   Electronically Signed   By: Tiburcio Pea M.D.   On: 03/11/2014 00:44     PERTINENT LAB RESULTS: CBC:  Recent Labs  03/11/14 0558 03/12/14 0600  WBC 8.4 7.3  HGB 9.3* 8.6*  HCT 28.8* 27.9*  PLT 277 274   CMET CMP     Component Value Date/Time   NA 140 03/12/2014 0600   NA 134* 09/24/2013   K 3.2* 03/12/2014 0600   CL 103 03/12/2014 0600   CO2 23 03/12/2014 0600   GLUCOSE 90 03/12/2014 0600   BUN 15 03/12/2014 0600   BUN 18 09/24/2013   CREATININE 0.44* 03/12/2014 0600   CREATININE 0.5* 09/24/2013   CALCIUM 8.9 03/12/2014 0600   PROT 8.1 03/11/2014 0558   ALBUMIN 2.8* 03/11/2014 0558   AST 25 03/11/2014 0558   ALT 10 03/11/2014 0558   ALKPHOS 151* 03/11/2014 0558   BILITOT 0.5 03/11/2014 0558    GFRNONAA >90 03/12/2014 0600   GFRAA >90 03/12/2014 0600    GFR Estimated Creatinine Clearance: 99.3 mL/min (by C-G formula based on Cr of 0.44). No results for input(s): LIPASE, AMYLASE in the last 72 hours. No results for input(s): CKTOTAL, CKMB, CKMBINDEX, TROPONINI in the last 72 hours.  Invalid input(s): POCBNP No results for input(s): DDIMER in the last 72 hours. No results for input(s): HGBA1C in the last 72 hours. No results for input(s): CHOL, HDL, LDLCALC, TRIG, CHOLHDL, LDLDIRECT in the last 72 hours. No results for input(s): TSH, T4TOTAL, T3FREE, THYROIDAB in the last 72 hours.  Invalid input(s): FREET3 No results for input(s): VITAMINB12, FOLATE, FERRITIN, TIBC, IRON, RETICCTPCT in the last 72 hours. Coags: No results for input(s): INR in the last 72 hours.  Invalid input(s): PT Microbiology: Recent Results (from the past 240 hour(s))  Blood culture (routine x 2)     Status: None (Preliminary result)   Collection Time: 03/10/14  6:40 PM  Result Value Ref Range Status   Specimen Description BLOOD HAND RIGHT  Final   Special Requests BOTTLES DRAWN AEROBIC AND ANAEROBIC 5CC  Final   Culture  Setup Time   Final    03/11/2014 01:14 Performed at [facility]    Culture   Final           BLOOD CULTURE RECEIVED NO GROWTH TO DATE CULTURE WILL BE HELD FOR 5 DAYS BEFORE ISSUING A FINAL NEGATIVE REPORT Performed at [facility]    Report Status PENDING  Incomplete  Urine culture     Status: None   Collection Time: 03/10/14  7:22 PM  Result Value Ref Range Status   Specimen Description URINE, CATHETERIZED  Final   Special Requests ADDED [id number]  Final   Culture  Setup Time   Final    03/11/2014 03:50 Performed at [facility]    Colony Count NO GROWTH Performed at [facility]   Final   Culture NO GROWTH Performed at [facility]   Final   Report Status 03/12/2014 FINAL  Final  Blood culture (routine x 2)     Status: None  (Preliminary result)   Collection Time: 03/10/14  7:58 PM  Result Value Ref Range Status   Specimen Description BLOOD LEFT  Final   Special Requests   Final    BOTTLES DRAWN AEROBIC AND ANAEROBIC 5CC INTRAJUGLAR   Culture  Setup Time   Final    03/11/2014 01:15 Performed at [facility]    Culture   Final           BLOOD CULTURE RECEIVED NO GROWTH TO DATE CULTURE WILL BE HELD FOR 5 DAYS BEFORE ISSUING A FINAL NEGATIVE REPORT Performed at [facility]    Report Status PENDING  Incomplete  MRSA PCR Screening     Status: Abnormal   Collection Time: 03/11/14  4:32 AM  Result Value Ref Range Status   MRSA by PCR POSITIVE (A) NEGATIVE Final    Comment:        The GeneXpert MRSA Assay (FDA approved for NASAL specimens only), is one component of a comprehensive MRSA colonization surveillance program. It is not intended to diagnose MRSA infection nor to guide or monitor treatment for MRSA infections. RESULT CALLED TO, READ BACK BY AND VERIFIED WITH: CALLED TO RN Liborio Nixon [facility] [phone] @[phone]  THANEY   Clostridium Difficile by PCR     Status: Abnormal   Collection Time: 03/11/14 10:56 AM  Result Value Ref Range Status   C difficile by pcr POSITIVE (A) NEGATIVE Final    Comment: CRITICAL RESULT CALLED TO, READ BACK BY AND VERIFIED WITH: ODEBERE RN 13:30 03/11/14 ([email or web address])   Stool culture     Status: None (Preliminary result)   Collection Time: 03/11/14 10:56 AM  Result Value Ref Range Status   Specimen Description STOOL  Final   Special Requests NONE  Final   Culture   Final    NO SUSPICIOUS COLONIES, CONTINUING TO HOLD Performed at [facility]    Report Status PENDING  Incomplete     BRIEF HOSPITAL COURSE:  SIR's:no foci evident on admission, UA neg for UTI, CXR neg for PNA. Noted to have foul smelling loose stool on 12/15-C Diff PCR positive, since this is recurrent, will start oral Vanco, plan for 6 weeks taper. Initially given recent  hospitalization/[place] bacteremia, and concern for PNA was started on IV Vanco/Cefepime on admission, this was discontinued. Fortunately, blood cultures negative for a for more than 48 hours, patient remains afebrile since admission hardly any diarrhea. Suspect patient is stable to be discharged back to SNF today.   Active Problems: Recurrent C Diff Colitis: minimize Abx exposure, since no other source if infection evident-blood cultures negative so far, IV Vanco/Cefepime stopped on 12/15. Plan on 6 weeks of tapering Vancomycin.  Acute Encephalopathy:suspect secondary to above. Has recent hx of SDH, CT head on 12/14 with decreased hematoma burden.Suspect that current mentation is baseline-this was confirmed with patient's brother on 12/16.  Adrenal Insufficiency:on chronic steroids-started on stress dose IV hydrocortisone-taper-will start Hydrocortisone via peg tube from 12/17. Please continue to monitor closely in the outpatient setting.  History of Seizures:Dilantin levels supratherapeutic after correction with albumin levels, appreciate pharmacy assistance. Dose reduced to 100 mg BID, recheck Dilantin level in one week.  Nausea & vomiting:resolved with supportive care. Suspect secondary to SIR's/C Diff Colitis.  Hypokalemia:repleted. Secondary to C Diff Colitis. Please recheck chemistries in 1 week.  Anemia: secondary to chronic disease. Hemoglobin remains stable, please check CBC in 1 week  Chronic aphasia and Quadriplegia:hx of spastic quadriparesis and aphasia. Appears to be at baseline.  PEG (percutaneous endoscopic gastrostomy) status:resumed peg feeds,continue on discharge   TODAY-DAY OF DISCHARGE:  Subjective:   [name] today remains without fever. He is nonverbal at baseline. No significant diarrhea.  Objective:   Blood pressure 112/74, pulse 83, temperature 98 F (36.7 C), temperature source Oral, resp. rate 20, weight 68.947 kg (152 lb), SpO2 96  %.  Intake/Output Summary (Last 24 hours) at 03/13/14 1001 Last data filed at 03/13/14 0815  Gross per 24 hour  Intake     80 ml  Output   1975 ml  Net  -1895 ml   Filed Weights   03/11/14 0250 03/11/14 2007 03/13/14 0222  Weight: 75.3 kg (166 lb 0.1 oz) 68.947 kg (152 lb) 68.947 kg (152 lb)    Exam Gen Exam: Awake, follows commands, aphasic at baseline.  Neck: Supple, No JVD.  Chest: B/L Clear-anteriorly CVS: S1 S2 Regular, no murmurs.  Abdomen: soft, BS +, non tender, non distended. Peg in place Extremities: no edema, lower extremities warm to touch. Neurologic:quadriparesis.   DISCHARGE CONDITION: Stable  DISPOSITION: SNF  DISCHARGE INSTRUCTIONS:    Activity:  As tolerated  Diet recommendation: NPO-peg feeds  Discharge Instructions    Call MD for:  temperature >100.4    Complete by:  As directed      Call MD for:    Complete by:  As directed   diarrhea     Increase activity slowly    Complete by:  As directed            Follow-up Information    Follow up with [name], MD. Schedule an appointment as soon as possible for  a visit in 2 days.   Specialty:  Internal Medicine   Contact information:   [address] [place] [address] [phone]       Total Time spent on discharge equals 45 minutes.  SignedJeoffrey Massed 03/13/2014 10:01 AM

## 2014-03-13 NOTE — Clinical Social Work Note (Addendum)
Patient discharging back to [facility] today. Patient mother contacted and informed of discharge. Discharge paperwork transmitted to facility. Patient will be transported to facility by ambulance.  3:29 pm - CSW contacted patient's father to inform him that patient in route to facility. Mr. Lepera reported that his wife is already at [facility] waiting for patient to arrive.   Azlee Monforte Givens, MSW, LCSW Licensed Clinical Social Worker [place] [phone]

## 2014-03-13 NOTE — Plan of Care (Signed)
Problem: Phase I Progression Outcomes Goal: Tolerating diet Outcome: Completed/Met Date Met:  03/13/14 Pt on tube feedings through peg tube.  Problem: Phase II Progression Outcomes Goal: ADLs completed with minimal assistance Outcome: Not Applicable Date Met:  09/32/67 Pt is total care.

## 2014-03-13 NOTE — Clinical Social Work Psychosocial (Signed)
Clinical Social Work Department BRIEF PSYCHOSOCIAL ASSESSMENT 03/13/2014  Patient:  [name], [name]     Account Number:  [id number]     Admit date:  03/10/2014  Clinical Social Worker:  Frederico Hamman  Date/Time:  03/13/2014 10:25 AM  Referred by:  Physician  Date Referred:  03/10/2014 Referred for  SNF Placement   Other Referral:   Interview type:  Family Other interview type:    PSYCHOSOCIAL DATA Living Status:  FACILITY Admitted from facility:  [place] Level of care:  [place] Primary support name:  [name] Primary support relationship to patient:  PARENT Degree of support available:   Patient's family supportive. Family includes patient parents [name] and [name] and his brother [name].    CURRENT CONCERNS Current Concerns  Post-Acute Placement   Other Concerns:    SOCIAL WORK ASSESSMENT / PLAN CSW talked by phone with patient's mother [name] and informed her that patient medically stable for discharge. CSW confirmed with mother that patient will return to [facility]. CSW advised mother that she will be contacted once transport called. Call made to admissions director at [facility] and message left regarding patient discharge.   Assessment/plan status:  No Further Intervention Required Other assessment/ plan:   Information/referral to community resources:   None needed or requested at this time.    PATIENT'S/FAMILY'S RESPONSE TO PLAN OF CARE: Mrs. Kerschner was pleasant and receptive to talking with CSW about discharge. She expressed appreciation to the staff for the good work they have done in caring for her son.     Rykin Route Givens, MSW, [place] [phone]

## 2014-03-14 DIAGNOSIS — M6281 Muscle weakness (generalized): Secondary | ICD-10-CM | POA: Diagnosis not present

## 2014-03-15 LAB — STOOL CULTURE

## 2014-03-16 ENCOUNTER — Encounter: Payer: Self-pay | Admitting: Internal Medicine

## 2014-03-16 ENCOUNTER — Non-Acute Institutional Stay (SKILLED_NURSING_FACILITY): Payer: Medicare Other | Admitting: Internal Medicine

## 2014-03-16 DIAGNOSIS — E876 Hypokalemia: Secondary | ICD-10-CM | POA: Diagnosis not present

## 2014-03-16 DIAGNOSIS — E2740 Unspecified adrenocortical insufficiency: Secondary | ICD-10-CM | POA: Diagnosis not present

## 2014-03-16 DIAGNOSIS — G8250 Quadriplegia, unspecified: Secondary | ICD-10-CM | POA: Diagnosis not present

## 2014-03-16 DIAGNOSIS — R569 Unspecified convulsions: Secondary | ICD-10-CM

## 2014-03-16 DIAGNOSIS — G9340 Encephalopathy, unspecified: Secondary | ICD-10-CM | POA: Diagnosis not present

## 2014-03-16 DIAGNOSIS — D508 Other iron deficiency anemias: Secondary | ICD-10-CM | POA: Diagnosis not present

## 2014-03-16 DIAGNOSIS — M6281 Muscle weakness (generalized): Secondary | ICD-10-CM | POA: Diagnosis not present

## 2014-03-16 DIAGNOSIS — A047 Enterocolitis due to Clostridium difficile: Secondary | ICD-10-CM | POA: Diagnosis not present

## 2014-03-16 DIAGNOSIS — Z931 Gastrostomy status: Secondary | ICD-10-CM

## 2014-03-16 DIAGNOSIS — R5081 Fever presenting with conditions classified elsewhere: Secondary | ICD-10-CM | POA: Insufficient documentation

## 2014-03-16 DIAGNOSIS — A0472 Enterocolitis due to Clostridium difficile, not specified as recurrent: Secondary | ICD-10-CM

## 2014-03-16 DIAGNOSIS — R4182 Altered mental status, unspecified: Secondary | ICD-10-CM | POA: Insufficient documentation

## 2014-03-16 NOTE — Progress Notes (Signed)
MRN: [id number] Name: [name]  Sex: male Age: 58 y.o. DOB: [birth year]  [facility] #:[facility]  Facility/Room: Level Of Care: SNF Provider: Merrilee Seashore D Emergency Contacts: Extended Emergency Contact Information Primary Emergency Contact: Cude,[name] Address: [address]          [place], [place] [address] [place] Home Phone: [phone] Mobile Phone: [phone] Relation: Mother Secondary Emergency Contact: Valinda Party [place] Home Phone: [phone] Relation: Brother Father: Graves, Hanson [place] Mobile Phone: [phone]  Code Status:FULL   Allergies: Aspirin; Nsaids; and Penicillins  Chief Complaint  Patient presents with  . New Admit To SNF    HPI: Patient is 58 y.o. male who was again septic this time with recurrent C. Diff, to be treated for 6 week taper, admitted back to SNF.  Past Medical History  Diagnosis Date  . Quadriplegia   . Depression   . Anemia   . Parkinson's disease   . Seizures   . Adrenal insufficiency   . Dysphagia 06/19/2013  . Aphasia 06/19/2013    Past Surgical History  Procedure Laterality Date  . Peg tube placement    . Peripherally inserted central catheter insertion    . Tonsillectomy  1962      Medication List       This list is accurate as of: 03/16/14 11:59 PM.  Always use your most recent med list.               baclofen 10 MG tablet  Commonly known as:  LIORESAL  Place 1 tablet (10 mg total) into feeding tube 3 (three) times daily.     CENTAMIN Liqd  5 Tubes by PEG Tube route daily.     famotidine 40 MG/5ML suspension  Commonly known as:  PEPCID  Place 2.5 mLs (20 mg total) into feeding tube 2 (two) times daily.     feeding supplement (OSMOLITE 1.5 CAL) Liqd  Place 1,000 mLs into feeding tube continuous.     feeding supplement (PRO-STAT SUGAR FREE 64) Liqd  Place 30 mLs into feeding tube 3 (three) times daily with meals.     ferrous  sulfate 220 (44 FE) MG/5ML solution  Place 330 mg into feeding tube See admin instructions. Give 7.29ml  Per tube every morning     folic acid 1 MG tablet  Commonly known as:  FOLVITE  1 mg by PEG Tube route daily.     free water Soln  Place 250 mLs into feeding tube every 4 (four) hours.     hydrocortisone 5 mg/mL Susp  Commonly known as:  CORTEF  Take 3 mLs (15 mg total) by mouth every evening.     hydrocortisone 5 mg/mL Susp  Commonly known as:  CORTEF  Place 5 mLs (25 mg total) into feeding tube daily at 6 (six) AM.     insulin aspart 100 UNIT/ML injection  Commonly known as:  novoLOG  - insulin aspart (novoLOG) injection 0-9 Units  - 0-9 Units, Subcutaneous, Every 4 hours  - CBG < 70: implement hypoglycemia protocol  - CBG 70 - 120: 0 units  - CBG 121 - 150: 1 unit  - CBG 151 - 200: 2 units  - CBG 201 - 250: 3 units  - CBG 251 - 300: 5 units  - CBG 301 - 350: 7 units  - CBG 351 - 400: 9 units  - CBG > 400: call MD     loratadine 10  MG tablet  Commonly known as:  CLARITIN  10 mg by PEG Tube route daily.     mupirocin ointment 2 %  Commonly known as:  BACTROBAN  Place 1 application into the nose 2 (two) times daily. For 4 more days from 03/13/14     olopatadine 0.1 % ophthalmic solution  Commonly known as:  PATANOL  Place 1 drop into both eyes every morning.     phenytoin 125 MG/5ML suspension  Commonly known as:  DILANTIN  Place 4 mLs (100 mg total) into feeding tube 2 (two) times daily.     polyvinyl alcohol 1.4 % ophthalmic solution  Commonly known as:  LIQUIFILM TEARS  Place 1 drop into both eyes daily.     saccharomyces boulardii 250 MG capsule  Commonly known as:  FLORASTOR  250 mg by PEG Tube route 2 (two) times daily.     vancomycin 50 mg/mL oral solution  Commonly known as:  VANCOCIN  Place 2.5 mLs (125 mg total) into feeding tube 4 (four) times daily. From 12/15 to 03/17/14     vancomycin 50 mg/mL oral solution  Commonly known as:   VANCOCIN  Place 2.5 mLs (125 mg total) into feeding tube 2 (two) times daily. From 12/22 to 03/24/14     vancomycin 50 mg/mL oral solution  Commonly known as:  VANCOCIN  Place 2.5 mLs (125 mg total) into feeding tube daily. From 03/25/14 to 01/014/16  Start taking on:  03/25/2014     vancomycin 50 mg/mL oral solution  Commonly known as:  VANCOCIN  Place 2.5 mLs (125 mg total) into feeding tube every other day. From 04/01/14 to 04/07/14  Start taking on:  04/01/2014     vancomycin 50 mg/mL oral solution  Commonly known as:  VANCOCIN  Place 2.5 mLs (125 mg total) into feeding tube every 3 (three) days. For 14 days from 04/09/14  Start taking on:  04/09/2014        No orders of the defined types were placed in this encounter.    Immunization History  Administered Date(s) Administered  . Influenza Whole 12/31/2012    History  Substance Use Topics  . Smoking status: Never Smoker   . Smokeless tobacco: Never Used  . Alcohol Use: No    Family history is noncontributory    Review of Systems    UTO    Filed Vitals:   03/16/14 1255  BP: 109/72  Pulse: 74  Temp: 98.3 F (36.8 C)  Resp: 18    Physical Exam  GENERAL APPEARANCE: Alert, nonconversant,  No acute distress.  SKIN: No diaphoresis rash HEAD: Normocephalic, atraumatic  EYES: Conjunctiva/lids clear. Pupils round, reactive  EARS: External exam WNL, canals clear.PEG tube.  NOSE: No deformity or discharge.  MOUTH/THROAT: Lips w/o lesions  RESPIRATORY: Breathing is even, unlabored. Lung sounds are clear   CARDIOVASCULAR: Heart RRR no murmurs, rubs or gallops. No peripheral edema.   GASTROINTESTINAL: Abdomen is soft, non-tender, not distended w/ normal bowel sounds. GENITOURINARY: Bladder non tender, not distended  MUSCULOSKELETAL: contractures NEUROLOGIC:  Quad, with some movement UE PSYCHIATRIC: , no behavioral issues  Patient Active Problem List   Diagnosis Date Noted  . Fever presenting with conditions  classified elsewhere 03/16/2014  . Altered mental status 03/16/2014  . SIRS (systemic inflammatory response syndrome) 03/11/2014  . Acute bronchiolitis due to unspecified organism 03/11/2014  . Enteritis due to Clostridium difficile 03/11/2014  . Type 2 diabetes mellitus without complication 03/11/2014  . Septic shock 01/26/2014  .  Sepsis 01/25/2014  . Fecal impaction 01/04/2014  . Acute encephalopathy 01/02/2014  . Acute respiratory failure with hypercapnia 01/02/2014  . Chronic anemia 01/02/2014  . Subdural hematoma 01/02/2014  . Pain in thoracic spine 12/26/2013  . PEG (percutaneous endoscopic gastrostomy) status 10/04/2013  . Rash and nonspecific skin eruption 07/08/2013  . Thrush 07/08/2013  . Heme positive stool 07/07/2013  . Severe sepsis 06/23/2013  . E. coli pyelonephritis 06/22/2013  . Leukocytosis 06/19/2013  . Bronchitis 06/19/2013  . Dysphagia 06/19/2013  . Aphasia 06/19/2013  . Fever, unspecified 06/19/2013  . Other convulsions 08/10/2012  . Iron deficiency anemia, unspecified 08/10/2012  . Glucocorticoid deficiency 08/10/2012  . Paralysis agitans 08/10/2012  . Nausea vomiting and diarrhea 06/15/2012  . Fever 06/15/2012  . UTI (lower urinary tract infection) 06/15/2012  . Hypokalemia 11/10/2011  . Nausea & vomiting 11/08/2011  . Ileus 11/08/2011  . Hyponatremia 11/08/2011  . Adrenal insufficiency 11/08/2011  . Quadriplegia 11/08/2011  . Parkinson's disease   . Seizures   . Anemia   . Depression     CBC    Component Value Date/Time   WBC 7.3 03/12/2014 0600   WBC 7.0 09/24/2013   RBC 3.84* 03/12/2014 0600   HGB 8.6* 03/12/2014 0600   HCT 27.9* 03/12/2014 0600   PLT 274 03/12/2014 0600   MCV 72.7* 03/12/2014 0600   LYMPHSABS 1.9 03/11/2014 0558   MONOABS 0.6 03/11/2014 0558   EOSABS 0.5 03/11/2014 0558   BASOSABS 0.0 03/11/2014 0558    CMP     Component Value Date/Time   NA 140 03/12/2014 0600   NA 134* 09/24/2013   K 3.2* 03/12/2014 0600    CL 103 03/12/2014 0600   CO2 23 03/12/2014 0600   GLUCOSE 90 03/12/2014 0600   BUN 15 03/12/2014 0600   BUN 18 09/24/2013   CREATININE 0.44* 03/12/2014 0600   CREATININE 0.5* 09/24/2013   CALCIUM 8.9 03/12/2014 0600   PROT 8.1 03/11/2014 0558   ALBUMIN 2.8* 03/11/2014 0558   AST 25 03/11/2014 0558   ALT 10 03/11/2014 0558   ALKPHOS 151* 03/11/2014 0558   BILITOT 0.5 03/11/2014 0558   GFRNONAA >90 03/12/2014 0600   GFRAA >90 03/12/2014 0600    Assessment and Plan  Enteritis due to Clostridium difficile minimize Abx exposure, since no other source if infection evident-blood cultures negative so far, IV Vanco/Cefepime stopped on 12/15. Plan on 6 weeks of tapering Vancomycin.   Acute encephalopathy suspect secondary to above. Has recent hx of SDH, CT head on 12/14 with decreased hematoma burden.Suspect that current mentation is baseline-this was confirmed with patient's brother on 12/16  Adrenal insufficiency :on chronic steroids-started on stress dose IV hydrocortisone-taper-will start Hydrocortisone via peg tube from 12/17. Please continue to monitor closely in the outpatient setting  Seizures Dilantin levels supratherapeutic after correction with albumin levels, appreciate pharmacy assistance. Dose reduced to 100 mg BID, recheck Dilantin level in one week  Hypokalemia repleted. Secondary to C Diff Colitis. Please recheck chemistries in 1 week.  Anemia : secondary to chronic disease. Hemoglobin remains stable, please check CBC in 1 week  Quadriplegia :hx of spastic quadriparesis and aphasia. Appears to be at baseline  PEG (percutaneous endoscopic gastrostomy) status Chronic tube feeds    Margit Hanks, MD

## 2014-03-17 DIAGNOSIS — M6281 Muscle weakness (generalized): Secondary | ICD-10-CM | POA: Diagnosis not present

## 2014-03-17 LAB — CULTURE, BLOOD (ROUTINE X 2)
Culture: NO GROWTH
Culture: NO GROWTH

## 2014-03-18 DIAGNOSIS — M6281 Muscle weakness (generalized): Secondary | ICD-10-CM | POA: Diagnosis not present

## 2014-03-19 ENCOUNTER — Encounter: Payer: Self-pay | Admitting: Internal Medicine

## 2014-03-19 DIAGNOSIS — M6281 Muscle weakness (generalized): Secondary | ICD-10-CM | POA: Diagnosis not present

## 2014-03-19 NOTE — Assessment & Plan Note (Signed)
repleted. Secondary to C Diff Colitis. Please recheck chemistries in 1 week.

## 2014-03-19 NOTE — Assessment & Plan Note (Signed)
:   secondary to chronic disease. Hemoglobin remains stable, please check CBC in 1 week

## 2014-03-19 NOTE — Assessment & Plan Note (Signed)
Chronic tube feeds

## 2014-03-19 NOTE — Assessment & Plan Note (Signed)
Dilantin levels supratherapeutic after correction with albumin levels, appreciate pharmacy assistance. Dose reduced to 100 mg BID, recheck Dilantin level in one week

## 2014-03-19 NOTE — Assessment & Plan Note (Signed)
:  on chronic steroids-started on stress dose IV hydrocortisone-taper-will start Hydrocortisone via peg tube from 12/17. Please continue to monitor closely in the outpatient setting

## 2014-03-19 NOTE — Assessment & Plan Note (Signed)
minimize Abx exposure, since no other source if infection evident-blood cultures negative so far, IV Vanco/Cefepime stopped on 12/15. Plan on 6 weeks of tapering Vancomycin.

## 2014-03-19 NOTE — Assessment & Plan Note (Signed)
:  hx of spastic quadriparesis and aphasia. Appears to be at baseline

## 2014-03-19 NOTE — Assessment & Plan Note (Signed)
suspect secondary to above. Has recent hx of SDH, CT head on 12/14 with decreased hematoma burden.Suspect that current mentation is baseline-this was confirmed with patient's brother on 12/16

## 2014-03-24 DIAGNOSIS — M6281 Muscle weakness (generalized): Secondary | ICD-10-CM | POA: Diagnosis not present

## 2014-03-24 DIAGNOSIS — R569 Unspecified convulsions: Secondary | ICD-10-CM | POA: Diagnosis not present

## 2014-03-24 DIAGNOSIS — D638 Anemia in other chronic diseases classified elsewhere: Secondary | ICD-10-CM | POA: Diagnosis not present

## 2014-03-25 DIAGNOSIS — M6281 Muscle weakness (generalized): Secondary | ICD-10-CM | POA: Diagnosis not present

## 2014-03-26 DIAGNOSIS — M6281 Muscle weakness (generalized): Secondary | ICD-10-CM | POA: Diagnosis not present

## 2014-03-27 DIAGNOSIS — M6281 Muscle weakness (generalized): Secondary | ICD-10-CM | POA: Diagnosis not present

## 2014-03-28 DIAGNOSIS — M6281 Muscle weakness (generalized): Secondary | ICD-10-CM | POA: Diagnosis not present

## 2014-04-01 DIAGNOSIS — G8250 Quadriplegia, unspecified: Secondary | ICD-10-CM | POA: Diagnosis not present

## 2014-04-01 DIAGNOSIS — M79674 Pain in right toe(s): Secondary | ICD-10-CM | POA: Diagnosis not present

## 2014-04-01 DIAGNOSIS — M79675 Pain in left toe(s): Secondary | ICD-10-CM | POA: Diagnosis not present

## 2014-04-01 DIAGNOSIS — G20 Parkinson's disease: Secondary | ICD-10-CM | POA: Diagnosis not present

## 2014-04-01 DIAGNOSIS — B351 Tinea unguium: Secondary | ICD-10-CM | POA: Diagnosis not present

## 2014-04-28 DIAGNOSIS — D649 Anemia, unspecified: Secondary | ICD-10-CM | POA: Diagnosis not present

## 2014-04-28 DIAGNOSIS — Z86010 Personal history of colonic polyps: Secondary | ICD-10-CM | POA: Diagnosis not present

## 2014-04-28 DIAGNOSIS — R911 Solitary pulmonary nodule: Secondary | ICD-10-CM | POA: Diagnosis not present

## 2014-04-28 DIAGNOSIS — Z8371 Family history of colonic polyps: Secondary | ICD-10-CM | POA: Diagnosis not present

## 2014-05-07 ENCOUNTER — Non-Acute Institutional Stay (SKILLED_NURSING_FACILITY): Payer: Medicare Other | Admitting: Internal Medicine

## 2014-05-07 ENCOUNTER — Encounter: Payer: Self-pay | Admitting: Internal Medicine

## 2014-05-07 DIAGNOSIS — A047 Enterocolitis due to Clostridium difficile: Secondary | ICD-10-CM | POA: Diagnosis not present

## 2014-05-07 DIAGNOSIS — E2740 Unspecified adrenocortical insufficiency: Secondary | ICD-10-CM

## 2014-05-07 DIAGNOSIS — D509 Iron deficiency anemia, unspecified: Secondary | ICD-10-CM

## 2014-05-07 DIAGNOSIS — R1310 Dysphagia, unspecified: Secondary | ICD-10-CM | POA: Diagnosis not present

## 2014-05-07 DIAGNOSIS — K3184 Gastroparesis: Principal | ICD-10-CM

## 2014-05-07 DIAGNOSIS — A0472 Enterocolitis due to Clostridium difficile, not specified as recurrent: Secondary | ICD-10-CM

## 2014-05-07 DIAGNOSIS — G8250 Quadriplegia, unspecified: Secondary | ICD-10-CM | POA: Diagnosis not present

## 2014-05-07 DIAGNOSIS — E119 Type 2 diabetes mellitus without complications: Secondary | ICD-10-CM

## 2014-05-07 DIAGNOSIS — E1143 Type 2 diabetes mellitus with diabetic autonomic (poly)neuropathy: Secondary | ICD-10-CM

## 2014-05-07 DIAGNOSIS — Z931 Gastrostomy status: Secondary | ICD-10-CM

## 2014-05-07 NOTE — Assessment & Plan Note (Signed)
Pt continues to have intermiddent vomiting, both when feeding tube is going and when its off; pt already on famotidine BID; will start Reglan c]ac ,qHS for what could be diabetic gastrroparesis

## 2014-05-07 NOTE — Assessment & Plan Note (Signed)
Pt on hydroocortisone 25 mg per tube qAM and 20 mg qPM;needs stress doses for illness

## 2014-05-07 NOTE — Progress Notes (Signed)
MRN: [id number] Name: [name]  Sex: male Age: 59 y.o. DOB: 03/28/1956  [facility] #: [name] Facility/Room:214D Level Of Care: SNF Provider: Merrilee Seashore D Emergency Contacts: Extended Emergency Contact Information Primary Emergency Contact: Adames,[name] Address: [address]          [place], [place] [address] [place] Home Phone: [phone] Mobile Phone: [phone] Relation: Mother Secondary Emergency Contact: Valinda Party [place] Home Phone: [phone] Relation: Brother Father: Emileo, [place] Mobile Phone: [phone]  Code Status: FULL  Allergies: Aspirin; Nsaids; and Penicillins  Chief Complaint  Patient presents with  . Medical Management of Chronic Issues    HPI: Patient is 59 y.o. male who is aphasix quad being seen for routine issues.  Past Medical History  Diagnosis Date  . Quadriplegia   . Depression   . Anemia   . Parkinson's disease   . Seizures   . Adrenal insufficiency   . Dysphagia 06/19/2013  . Aphasia 06/19/2013    Past Surgical History  Procedure Laterality Date  . Peg tube placement    . Peripherally inserted central catheter insertion    . Tonsillectomy  1962      Medication List       This list is accurate as of: 05/07/14 10:46 AM.  Always use your most recent med list.               baclofen 10 MG tablet  Commonly known as:  LIORESAL  Place 1 tablet (10 mg total) into feeding tube 3 (three) times daily.     CENTAMIN Liqd  5 Tubes by PEG Tube route daily.     famotidine 40 MG/5ML suspension  Commonly known as:  PEPCID  Place 2.5 mLs (20 mg total) into feeding tube 2 (two) times daily.     feeding supplement (OSMOLITE 1.5 CAL) Liqd  Place 1,000 mLs into feeding tube continuous.     feeding supplement (PRO-STAT SUGAR FREE 64) Liqd  Place 30 mLs into feeding tube 3 (three) times daily with meals.     ferrous sulfate 220 (44 FE) MG/5ML solution   Place 330 mg into feeding tube See admin instructions. Give 7.63ml  Per tube every morning     folic acid 1 MG tablet  Commonly known as:  FOLVITE  1 mg by PEG Tube route daily.     free water Soln  Place 250 mLs into feeding tube every 4 (four) hours.     hydrocortisone 5 mg/mL Susp  Commonly known as:  CORTEF  Take 3 mLs (15 mg total) by mouth every evening.     hydrocortisone 5 mg/mL Susp  Commonly known as:  CORTEF  Place 5 mLs (25 mg total) into feeding tube daily at 6 (six) AM.     insulin aspart 100 UNIT/ML injection  Commonly known as:  novoLOG  - insulin aspart (novoLOG) injection 0-9 Units  - 0-9 Units, Subcutaneous, Every 4 hours  - CBG < 70: implement hypoglycemia protocol  - CBG 70 - 120: 0 units  - CBG 121 - 150: 1 unit  - CBG 151 - 200: 2 units  - CBG 201 - 250: 3 units  - CBG 251 - 300: 5 units  - CBG 301 - 350: 7 units  - CBG 351 - 400: 9 units  - CBG > 400: call MD     loratadine 10 MG tablet  Commonly known as:  CLARITIN  10 mg  by PEG Tube route daily.     mupirocin ointment 2 %  Commonly known as:  BACTROBAN  Place 1 application into the nose 2 (two) times daily. For 4 more days from 03/13/14     olopatadine 0.1 % ophthalmic solution  Commonly known as:  PATANOL  Place 1 drop into both eyes every morning.     phenytoin 125 MG/5ML suspension  Commonly known as:  DILANTIN  Place 4 mLs (100 mg total) into feeding tube 2 (two) times daily.     polyvinyl alcohol 1.4 % ophthalmic solution  Commonly known as:  LIQUIFILM TEARS  Place 1 drop into both eyes daily.     saccharomyces boulardii 250 MG capsule  Commonly known as:  FLORASTOR  250 mg by PEG Tube route 2 (two) times daily.     vancomycin 50 mg/mL oral solution  Commonly known as:  VANCOCIN  Place 2.5 mLs (125 mg total) into feeding tube every 3 (three) days. For 14 days from 04/09/14        No orders of the defined types were placed in this encounter.    Immunization  History  Administered Date(s) Administered  . Influenza Whole 12/31/2012    History  Substance Use Topics  . Smoking status: Never Smoker   . Smokeless tobacco: Never Used  . Alcohol Use: No    Review of Systems  DATA OBTAINED: from nurse, medical record GENERAL:  no fevers, fatigue, appetite changes SKIN: No itching, rash HEENT: No complaint RESPIRATORY: No cough, wheezing, SOB CARDIAC: No chest pain, palpitations, lower extremity edema  GI: No abdominal pain, No intermiddent vomiting, chronic,  No diarrheaor constipation  GU: No dysuria, frequency or urgency, or incontinence  MUSCULOSKELETAL: No unrelieved bone/joint pain NEUROLOGIC: No headache, dizziness  PSYCHIATRIC: No overt anxiety or sadness  Filed Vitals:   05/07/14 0952  BP: 125/70  Pulse: 96  Temp: 99 F (37.2 C)  Resp: 20    Physical Exam  GENERAL APPEARANCE: Alert, nonconversant, No acute distress;pt cab communicate with gestures some,a book, but I can never understand him  SKIN: No diaphoresis rash, or wounds HEENT: Unremarkable RESPIRATORY: Breathing is even, unlabored. Lung sounds are clear   CARDIOVASCULAR: Heart RRR no murmurs, rubs or gallops. No peripheral edema  GASTROINTESTINAL: Abdomen is soft, non-tender, not distended w/ normal bowel sounds.  GENITOURINARY: Bladder non tender, not distended  MUSCULOSKELETAL: contractures,wasting NEUROLOGIC: Cranial nerves 2-12 grossly intact; quadriplegic PSYCHIATRIC: Mood and affect appropriate to situation, no behavioral issues  Patient Active Problem List   Diagnosis Date Noted  . Fever presenting with conditions classified elsewhere 03/16/2014  . Altered mental status 03/16/2014  . SIRS (systemic inflammatory response syndrome) 03/11/2014  . Acute bronchiolitis due to unspecified organism 03/11/2014  . Enteritis due to Clostridium difficile 03/11/2014  . Type 2 diabetes mellitus without complication 03/11/2014  . Septic shock 01/26/2014  . Sepsis  01/25/2014  . Fecal impaction 01/04/2014  . Acute encephalopathy 01/02/2014  . Acute respiratory failure with hypercapnia 01/02/2014  . Chronic anemia 01/02/2014  . Subdural hematoma 01/02/2014  . Pain in thoracic spine 12/26/2013  . PEG (percutaneous endoscopic gastrostomy) status 10/04/2013  . Rash and nonspecific skin eruption 07/08/2013  . Thrush 07/08/2013  . Heme positive stool 07/07/2013  . Severe sepsis 06/23/2013  . E. coli pyelonephritis 06/22/2013  . Leukocytosis 06/19/2013  . Bronchitis 06/19/2013  . Dysphagia 06/19/2013  . Aphasia 06/19/2013  . Fever, unspecified 06/19/2013  . Other convulsions 08/10/2012  . Iron deficiency anemia  08/10/2012  . Glucocorticoid deficiency 08/10/2012  . Paralysis agitans 08/10/2012  . Nausea vomiting and diarrhea 06/15/2012  . Fever 06/15/2012  . UTI (lower urinary tract infection) 06/15/2012  . Hypokalemia 11/10/2011  . Nausea & vomiting 11/08/2011  . Ileus 11/08/2011  . Hyponatremia 11/08/2011  . Adrenal insufficiency 11/08/2011  . Quadriplegia 11/08/2011  . Parkinson's disease   . Seizures   . Anemia   . Depression     CBC    Component Value Date/Time   WBC 7.3 03/12/2014 0600   WBC 7.0 09/24/2013   RBC 3.84* 03/12/2014 0600   HGB 8.6* 03/12/2014 0600   HCT 27.9* 03/12/2014 0600   PLT 274 03/12/2014 0600   MCV 72.7* 03/12/2014 0600   LYMPHSABS 1.9 03/11/2014 0558   MONOABS 0.6 03/11/2014 0558   EOSABS 0.5 03/11/2014 0558   BASOSABS 0.0 03/11/2014 0558    CMP     Component Value Date/Time   NA 140 03/12/2014 0600   NA 134* 09/24/2013   K 3.2* 03/12/2014 0600   CL 103 03/12/2014 0600   CO2 23 03/12/2014 0600   GLUCOSE 90 03/12/2014 0600   BUN 15 03/12/2014 0600   BUN 18 09/24/2013   CREATININE 0.44* 03/12/2014 0600   CREATININE 0.5* 09/24/2013   CALCIUM 8.9 03/12/2014 0600   PROT 8.1 03/11/2014 0558   ALBUMIN 2.8* 03/11/2014 0558   AST 25 03/11/2014 0558   ALT 10 03/11/2014 0558   ALKPHOS 151*  03/11/2014 0558   BILITOT 0.5 03/11/2014 0558   GFRNONAA >90 03/12/2014 0600   GFRAA >90 03/12/2014 0600    Assessment and Plan  Nausea & vomiting Pt continues to have intermiddent vomiting, both when feeding tube is going and when its off; pt already on famotidine BID; will start Reglan c]ac ,qHS for what could be diabetic gastrroparesis   Enteritis due to Clostridium difficile Pt continues on tapering vancomycin for 6 weeks, until 05/27/2014   Adrenal insufficiency Pt on hydroocortisone 25 mg per tube qAM and 20 mg qPM;needs stress doses for illness   Type 2 diabetes mellitus without complication Nursing reports no problem with BS;diet controlled   Iron deficiency anemia Hb 12/28  12.0/37.1; continue iron   Dysphagia PEG tube feedings forever   Quadriplegia Chronic ,stabela and no change   PEG (percutaneous endoscopic gastrostomy) status Noted     Margit Hanks, MD

## 2014-05-07 NOTE — Assessment & Plan Note (Signed)
Nursing reports no problem with BS;diet controlled

## 2014-05-07 NOTE — Assessment & Plan Note (Signed)
Chronic ,stabela and no change

## 2014-05-07 NOTE — Assessment & Plan Note (Signed)
Hb 12/28  12.0/37.1; continue iron

## 2014-05-07 NOTE — Assessment & Plan Note (Signed)
PEG tube feedings forever

## 2014-05-07 NOTE — Assessment & Plan Note (Signed)
Noted  

## 2014-05-07 NOTE — Assessment & Plan Note (Signed)
Pt continues on tapering vancomycin for 6 weeks, until 05/27/2014

## 2014-05-27 ENCOUNTER — Other Ambulatory Visit (HOSPITAL_COMMUNITY): Payer: Self-pay | Admitting: Gastroenterology

## 2014-05-27 DIAGNOSIS — R911 Solitary pulmonary nodule: Secondary | ICD-10-CM

## 2014-06-02 ENCOUNTER — Encounter (HOSPITAL_COMMUNITY): Payer: Self-pay

## 2014-06-02 ENCOUNTER — Ambulatory Visit (HOSPITAL_COMMUNITY)
Admission: RE | Admit: 2014-06-02 | Discharge: 2014-06-02 | Disposition: A | Discharge status: Home / Self Care | Payer: Medicare Other | Source: Ambulatory Visit | Attending: Gastroenterology | Admitting: Gastroenterology

## 2014-06-02 DIAGNOSIS — R918 Other nonspecific abnormal finding of lung field: Secondary | ICD-10-CM | POA: Diagnosis not present

## 2014-06-02 DIAGNOSIS — I2510 Atherosclerotic heart disease of native coronary artery without angina pectoris: Secondary | ICD-10-CM | POA: Diagnosis not present

## 2014-06-02 DIAGNOSIS — R911 Solitary pulmonary nodule: Secondary | ICD-10-CM

## 2014-06-02 MED ORDER — IOHEXOL 300 MG/ML  SOLN
80.0000 mL | Freq: Once | INTRAMUSCULAR | Status: AC | PRN
  Administered 2014-06-02: 80 mL via INTRAVENOUS

## 2014-06-03 DIAGNOSIS — H1013 Acute atopic conjunctivitis, bilateral: Secondary | ICD-10-CM | POA: Diagnosis not present

## 2014-06-03 DIAGNOSIS — H2513 Age-related nuclear cataract, bilateral: Secondary | ICD-10-CM | POA: Diagnosis not present

## 2014-06-03 DIAGNOSIS — Z7951 Long term (current) use of inhaled steroids: Secondary | ICD-10-CM | POA: Diagnosis not present

## 2014-06-09 DIAGNOSIS — D509 Iron deficiency anemia, unspecified: Secondary | ICD-10-CM | POA: Diagnosis not present

## 2014-06-25 DIAGNOSIS — G20 Parkinson's disease: Secondary | ICD-10-CM | POA: Diagnosis not present

## 2014-06-25 DIAGNOSIS — M24542 Contracture, left hand: Secondary | ICD-10-CM | POA: Diagnosis not present

## 2014-06-26 DIAGNOSIS — M24542 Contracture, left hand: Secondary | ICD-10-CM | POA: Diagnosis not present

## 2014-06-26 DIAGNOSIS — G20 Parkinson's disease: Secondary | ICD-10-CM | POA: Diagnosis not present

## 2014-06-27 DIAGNOSIS — M24542 Contracture, left hand: Secondary | ICD-10-CM | POA: Diagnosis not present

## 2014-06-27 DIAGNOSIS — G9340 Encephalopathy, unspecified: Secondary | ICD-10-CM | POA: Diagnosis not present

## 2014-06-27 DIAGNOSIS — G20 Parkinson's disease: Secondary | ICD-10-CM | POA: Diagnosis not present

## 2014-06-29 DIAGNOSIS — G20 Parkinson's disease: Secondary | ICD-10-CM | POA: Diagnosis not present

## 2014-06-29 DIAGNOSIS — G9340 Encephalopathy, unspecified: Secondary | ICD-10-CM | POA: Diagnosis not present

## 2014-06-29 DIAGNOSIS — M24542 Contracture, left hand: Secondary | ICD-10-CM | POA: Diagnosis not present

## 2014-06-30 DIAGNOSIS — M24542 Contracture, left hand: Secondary | ICD-10-CM | POA: Diagnosis not present

## 2014-06-30 DIAGNOSIS — G9340 Encephalopathy, unspecified: Secondary | ICD-10-CM | POA: Diagnosis not present

## 2014-06-30 DIAGNOSIS — G20 Parkinson's disease: Secondary | ICD-10-CM | POA: Diagnosis not present

## 2014-07-01 DIAGNOSIS — M24542 Contracture, left hand: Secondary | ICD-10-CM | POA: Diagnosis not present

## 2014-07-01 DIAGNOSIS — G9340 Encephalopathy, unspecified: Secondary | ICD-10-CM | POA: Diagnosis not present

## 2014-07-01 DIAGNOSIS — G20 Parkinson's disease: Secondary | ICD-10-CM | POA: Diagnosis not present

## 2014-07-02 DIAGNOSIS — G9340 Encephalopathy, unspecified: Secondary | ICD-10-CM | POA: Diagnosis not present

## 2014-07-02 DIAGNOSIS — M24542 Contracture, left hand: Secondary | ICD-10-CM | POA: Diagnosis not present

## 2014-07-02 DIAGNOSIS — G20 Parkinson's disease: Secondary | ICD-10-CM | POA: Diagnosis not present

## 2014-07-03 DIAGNOSIS — G20 Parkinson's disease: Secondary | ICD-10-CM | POA: Diagnosis not present

## 2014-07-03 DIAGNOSIS — G9340 Encephalopathy, unspecified: Secondary | ICD-10-CM | POA: Diagnosis not present

## 2014-07-03 DIAGNOSIS — M24542 Contracture, left hand: Secondary | ICD-10-CM | POA: Diagnosis not present

## 2014-07-04 DIAGNOSIS — G9340 Encephalopathy, unspecified: Secondary | ICD-10-CM | POA: Diagnosis not present

## 2014-07-04 DIAGNOSIS — M24542 Contracture, left hand: Secondary | ICD-10-CM | POA: Diagnosis not present

## 2014-07-04 DIAGNOSIS — G20 Parkinson's disease: Secondary | ICD-10-CM | POA: Diagnosis not present

## 2014-07-05 DIAGNOSIS — M24542 Contracture, left hand: Secondary | ICD-10-CM | POA: Diagnosis not present

## 2014-07-05 DIAGNOSIS — G20 Parkinson's disease: Secondary | ICD-10-CM | POA: Diagnosis not present

## 2014-07-05 DIAGNOSIS — G9340 Encephalopathy, unspecified: Secondary | ICD-10-CM | POA: Diagnosis not present

## 2014-07-07 DIAGNOSIS — G9340 Encephalopathy, unspecified: Secondary | ICD-10-CM | POA: Diagnosis not present

## 2014-07-07 DIAGNOSIS — M24542 Contracture, left hand: Secondary | ICD-10-CM | POA: Diagnosis not present

## 2014-07-07 DIAGNOSIS — G20 Parkinson's disease: Secondary | ICD-10-CM | POA: Diagnosis not present

## 2014-07-08 DIAGNOSIS — B351 Tinea unguium: Secondary | ICD-10-CM | POA: Diagnosis not present

## 2014-07-08 DIAGNOSIS — M79674 Pain in right toe(s): Secondary | ICD-10-CM | POA: Diagnosis not present

## 2014-07-08 DIAGNOSIS — G20 Parkinson's disease: Secondary | ICD-10-CM | POA: Diagnosis not present

## 2014-07-08 DIAGNOSIS — M79675 Pain in left toe(s): Secondary | ICD-10-CM | POA: Diagnosis not present

## 2014-07-08 DIAGNOSIS — M24542 Contracture, left hand: Secondary | ICD-10-CM | POA: Diagnosis not present

## 2014-07-08 DIAGNOSIS — G9340 Encephalopathy, unspecified: Secondary | ICD-10-CM | POA: Diagnosis not present

## 2014-07-09 DIAGNOSIS — G9340 Encephalopathy, unspecified: Secondary | ICD-10-CM | POA: Diagnosis not present

## 2014-07-09 DIAGNOSIS — G20 Parkinson's disease: Secondary | ICD-10-CM | POA: Diagnosis not present

## 2014-07-09 DIAGNOSIS — M24542 Contracture, left hand: Secondary | ICD-10-CM | POA: Diagnosis not present

## 2014-07-10 DIAGNOSIS — G20 Parkinson's disease: Secondary | ICD-10-CM | POA: Diagnosis not present

## 2014-07-10 DIAGNOSIS — M24542 Contracture, left hand: Secondary | ICD-10-CM | POA: Diagnosis not present

## 2014-07-10 DIAGNOSIS — G9340 Encephalopathy, unspecified: Secondary | ICD-10-CM | POA: Diagnosis not present

## 2014-07-11 DIAGNOSIS — G20 Parkinson's disease: Secondary | ICD-10-CM | POA: Diagnosis not present

## 2014-07-11 DIAGNOSIS — M24542 Contracture, left hand: Secondary | ICD-10-CM | POA: Diagnosis not present

## 2014-07-11 DIAGNOSIS — G9340 Encephalopathy, unspecified: Secondary | ICD-10-CM | POA: Diagnosis not present

## 2014-07-14 DIAGNOSIS — M24542 Contracture, left hand: Secondary | ICD-10-CM | POA: Diagnosis not present

## 2014-07-14 DIAGNOSIS — G20 Parkinson's disease: Secondary | ICD-10-CM | POA: Diagnosis not present

## 2014-07-14 DIAGNOSIS — G9340 Encephalopathy, unspecified: Secondary | ICD-10-CM | POA: Diagnosis not present

## 2014-07-15 DIAGNOSIS — G20 Parkinson's disease: Secondary | ICD-10-CM | POA: Diagnosis not present

## 2014-07-15 DIAGNOSIS — G9340 Encephalopathy, unspecified: Secondary | ICD-10-CM | POA: Diagnosis not present

## 2014-07-15 DIAGNOSIS — M24542 Contracture, left hand: Secondary | ICD-10-CM | POA: Diagnosis not present

## 2014-07-16 DIAGNOSIS — M24542 Contracture, left hand: Secondary | ICD-10-CM | POA: Diagnosis not present

## 2014-07-16 DIAGNOSIS — G9340 Encephalopathy, unspecified: Secondary | ICD-10-CM | POA: Diagnosis not present

## 2014-07-16 DIAGNOSIS — G20 Parkinson's disease: Secondary | ICD-10-CM | POA: Diagnosis not present

## 2014-07-17 DIAGNOSIS — G9340 Encephalopathy, unspecified: Secondary | ICD-10-CM | POA: Diagnosis not present

## 2014-07-17 DIAGNOSIS — G20 Parkinson's disease: Secondary | ICD-10-CM | POA: Diagnosis not present

## 2014-07-17 DIAGNOSIS — M24542 Contracture, left hand: Secondary | ICD-10-CM | POA: Diagnosis not present

## 2014-07-27 DIAGNOSIS — J189 Pneumonia, unspecified organism: Secondary | ICD-10-CM

## 2014-07-27 DIAGNOSIS — N390 Urinary tract infection, site not specified: Secondary | ICD-10-CM

## 2014-07-27 HISTORY — DX: Pneumonia, unspecified organism: J18.9

## 2014-07-27 HISTORY — DX: Urinary tract infection, site not specified: N39.0

## 2014-07-28 ENCOUNTER — Encounter: Payer: Self-pay | Admitting: *Deleted

## 2014-08-01 ENCOUNTER — Non-Acute Institutional Stay (SKILLED_NURSING_FACILITY): Payer: Medicare Other | Admitting: Internal Medicine

## 2014-08-01 ENCOUNTER — Encounter: Payer: Self-pay | Admitting: Internal Medicine

## 2014-08-01 DIAGNOSIS — H109 Unspecified conjunctivitis: Secondary | ICD-10-CM | POA: Diagnosis not present

## 2014-08-01 NOTE — Progress Notes (Signed)
MRN: [id number] Name: [name]  Sex: male Age: 59 y.o. DOB: [birth year]  [facility] #: [name]  Facility/Room:214 Level Of Care: SNF Provider: Merrilee Seashore D Emergency Contacts: Extended Emergency Contact Information Primary Emergency Contact: Agar,[name] Address: [address]          [place], [place] [address] [place] Home Phone: [phone] Mobile Phone: [phone] Relation: Mother Secondary Emergency Contact: Valinda Party [place] Home Phone: [phone] Relation: Brother Father: Mel[place], [place] Mobile Phone: [phone]  Code Status: FULL  Allergies: Aspirin; Nsaids; and Penicillins  Chief Complaint  Patient presents with  . Acute Visit    HPI: Patient is 58 y.o. male who nursing asked me to see for a new onset L eye redness.  Past Medical History  Diagnosis Date  . Quadriplegia   . Depression   . Anemia   . Parkinson's disease   . Seizures   . Adrenal insufficiency   . Dysphagia 06/19/2013  . Aphasia 06/19/2013    Past Surgical History  Procedure Laterality Date  . Peg tube placement    . Peripherally inserted central catheter insertion    . Tonsillectomy  1962      Medication List       This list is accurate as of: 08/01/14 11:59 PM.  Always use your most recent med list.               baclofen 10 MG tablet  Commonly known as:  LIORESAL  Place 1 tablet (10 mg total) into feeding tube 3 (three) times daily.     CENTAMIN Liqd  5 Tubes by PEG Tube route daily.     famotidine 40 MG/5ML suspension  Commonly known as:  PEPCID  Place 2.5 mLs (20 mg total) into feeding tube 2 (two) times daily.     feeding supplement (OSMOLITE 1.5 CAL) Liqd  Place 1,000 mLs into feeding tube continuous.     feeding supplement (PRO-STAT SUGAR FREE 64) Liqd  Place 30 mLs into feeding tube 3 (three) times daily with meals.     ferrous sulfate 220 (44 FE) MG/5ML solution  Place 330 mg into  feeding tube See admin instructions. Give 7.32ml  Per tube every morning     folic acid 1 MG tablet  Commonly known as:  FOLVITE  1 mg by PEG Tube route daily.     free water Soln  Place 250 mLs into feeding tube every 4 (four) hours.     hydrocortisone 5 mg/mL Susp  Commonly known as:  CORTEF  Take 3 mLs (15 mg total) by mouth every evening.     hydrocortisone 5 mg/mL Susp  Commonly known as:  CORTEF  Place 5 mLs (25 mg total) into feeding tube daily at 6 (six) AM.     loratadine 10 MG tablet  Commonly known as:  CLARITIN  10 mg by PEG Tube route daily.     metoCLOPramide 10 MG tablet  Commonly known as:  REGLAN  Take 10 mg by mouth 2 (two) times daily after a meal.     olopatadine 0.1 % ophthalmic solution  Commonly known as:  PATANOL  Place 1 drop into both eyes every morning.     phenytoin 125 MG/5ML suspension  Commonly known as:  DILANTIN  Place 4 mLs (100 mg total) into feeding tube 2 (two) times daily.     polyvinyl alcohol 1.4 % ophthalmic solution  Commonly known as:  LIQUIFILM  TEARS  Place 1 drop into both eyes daily.     saccharomyces boulardii 250 MG capsule  Commonly known as:  FLORASTOR  250 mg by PEG Tube route 2 (two) times daily.     vancomycin 50 mg/mL oral solution  Commonly known as:  VANCOCIN  Place 2.5 mLs (125 mg total) into feeding tube every 3 (three) days. For 14 days from 04/09/14        No orders of the defined types were placed in this encounter.    Immunization History  Administered Date(s) Administered  . Influenza Whole 12/31/2012  . Influenza-Unspecified 01/07/2014  . PPD Test 08/22/2008  . Pneumococcal-Unspecified 06/23/2010    History  Substance Use Topics  . Smoking status: Never Smoker   . Smokeless tobacco: Never Used  . Alcohol Use: No    Review of Systems  DATA OBTAINED: from nurse - pt can't communicate well GENERAL:  no fevers, fatigue, appetite changes SKIN: No itching, rash HEENT:L eye with redness and  some d/c RESPIRATORY: No cough, wheezing, SOB CARDIAC: No chest pain, palpitations, lower extremity edema  GI: No abdominal pain, No N/V/D or constipation, No heartburn or reflux  GU: No dysuria, frequency or urgency, or incontinence  MUSCULOSKELETAL: No unrelieved bone/joint pain NEUROLOGIC: No headache, dizziness  PSYCHIATRIC: No overt anxiety or sadness  Filed Vitals:   08/01/14 1421  BP: 125/70  Pulse: 78  Temp: 98.2 F (36.8 C)  Resp: 17    Physical Exam  GENERAL APPEARANCE: Alert, nonconversant, No acute distress  SKIN: No diaphoresis rash HEENT: l eye- mod injection, slt clear d/c ;r eye - normal RESPIRATORY: Breathing is even, unlabored. Lung sounds are clear   CARDIOVASCULAR: Heart RRR no murmurs, rubs or gallops. No peripheral edema  GASTROINTESTINAL: Abdomen is soft, non-tender, not distended w/ normal bowel sounds.  GENITOURINARY: Bladder non tender, not distended  MUSCULOSKELETAL: contractures, wasting NEUROLOGIC: Cranial nerves 2-12 grossly intact; quad PSYCHIATRIC: Mood and affect appropriate to situation, no behavioral issues  Patient Active Problem List   Diagnosis Date Noted  . Fever presenting with conditions classified elsewhere 03/16/2014  . Altered mental status 03/16/2014  . SIRS (systemic inflammatory response syndrome) 03/11/2014  . Acute bronchiolitis due to unspecified organism 03/11/2014  . Enteritis due to Clostridium difficile 03/11/2014  . Type 2 diabetes mellitus without complication 03/11/2014  . Septic shock 01/26/2014  . Sepsis 01/25/2014  . Fecal impaction 01/04/2014  . Acute encephalopathy 01/02/2014  . Acute respiratory failure with hypercapnia 01/02/2014  . Chronic anemia 01/02/2014  . Subdural hematoma 01/02/2014  . Pain in thoracic spine 12/26/2013  . PEG (percutaneous endoscopic gastrostomy) status 10/04/2013  . Rash and nonspecific skin eruption 07/08/2013  . Thrush 07/08/2013  . Heme positive stool 07/07/2013  . Severe  sepsis 06/23/2013  . E. coli pyelonephritis 06/22/2013  . Leukocytosis 06/19/2013  . Bronchitis 06/19/2013  . Dysphagia 06/19/2013  . Aphasia 06/19/2013  . Fever, unspecified 06/19/2013  . Other convulsions 08/10/2012  . Iron deficiency anemia 08/10/2012  . Glucocorticoid deficiency 08/10/2012  . Paralysis agitans 08/10/2012  . Nausea vomiting and diarrhea 06/15/2012  . Fever 06/15/2012  . UTI (lower urinary tract infection) 06/15/2012  . Hypokalemia 11/10/2011  . Diabetic gastroparesis 11/08/2011  . Ileus 11/08/2011  . Hyponatremia 11/08/2011  . Adrenal insufficiency 11/08/2011  . Quadriplegia 11/08/2011  . Parkinson's disease   . Seizures   . Anemia   . Depression     CBC    Component Value Date/Time   WBC 7.3  03/12/2014 0600   WBC 7.0 09/24/2013   RBC 3.84* 03/12/2014 0600   HGB 8.6* 03/12/2014 0600   HCT 27.9* 03/12/2014 0600   PLT 274 03/12/2014 0600   MCV 72.7* 03/12/2014 0600   LYMPHSABS 1.9 03/11/2014 0558   MONOABS 0.6 03/11/2014 0558   EOSABS 0.5 03/11/2014 0558   BASOSABS 0.0 03/11/2014 0558    CMP     Component Value Date/Time   NA 140 03/12/2014 0600   NA 134* 09/24/2013   K 3.2* 03/12/2014 0600   CL 103 03/12/2014 0600   CO2 23 03/12/2014 0600   GLUCOSE 90 03/12/2014 0600   BUN 15 03/12/2014 0600   BUN 18 09/24/2013   CREATININE 0.44* 03/12/2014 0600   CREATININE 0.5* 09/24/2013   CALCIUM 8.9 03/12/2014 0600   PROT 8.1 03/11/2014 0558   ALBUMIN 2.8* 03/11/2014 0558   AST 25 03/11/2014 0558   ALT 10 03/11/2014 0558   ALKPHOS 151* 03/11/2014 0558   BILITOT 0.5 03/11/2014 0558   GFRNONAA >90 03/12/2014 0600   GFRAA >90 03/12/2014 0600    Assessment and Plan  CONJUNCTIVITIS - L eye, pt is rubbing it some - garamycin 2 ggts QID for 7 days  Margit Hanks, MD

## 2014-08-04 DIAGNOSIS — I10 Essential (primary) hypertension: Secondary | ICD-10-CM | POA: Diagnosis not present

## 2014-08-04 DIAGNOSIS — Z125 Encounter for screening for malignant neoplasm of prostate: Secondary | ICD-10-CM | POA: Diagnosis not present

## 2014-08-04 DIAGNOSIS — E108 Type 1 diabetes mellitus with unspecified complications: Secondary | ICD-10-CM | POA: Diagnosis not present

## 2014-08-06 ENCOUNTER — Non-Acute Institutional Stay (SKILLED_NURSING_FACILITY): Payer: Medicare Other | Admitting: Internal Medicine

## 2014-08-06 ENCOUNTER — Encounter: Payer: Self-pay | Admitting: Internal Medicine

## 2014-08-06 ENCOUNTER — Inpatient Hospital Stay (HOSPITAL_COMMUNITY): Payer: Medicare Other

## 2014-08-06 ENCOUNTER — Inpatient Hospital Stay (HOSPITAL_COMMUNITY)
Admission: EM | Admit: 2014-08-06 | Discharge: 2014-08-13 | DRG: 871 | Disposition: A | Discharge status: Skilled Nursing Facility | Payer: Medicare Other | Attending: Internal Medicine | Admitting: Internal Medicine

## 2014-08-06 ENCOUNTER — Emergency Department (HOSPITAL_COMMUNITY): Payer: Medicare Other

## 2014-08-06 ENCOUNTER — Encounter (HOSPITAL_COMMUNITY): Payer: Self-pay | Admitting: Emergency Medicine

## 2014-08-06 DIAGNOSIS — K3184 Gastroparesis: Secondary | ICD-10-CM | POA: Diagnosis present

## 2014-08-06 DIAGNOSIS — R1313 Dysphagia, pharyngeal phase: Secondary | ICD-10-CM | POA: Diagnosis present

## 2014-08-06 DIAGNOSIS — R4701 Aphasia: Secondary | ICD-10-CM | POA: Diagnosis present

## 2014-08-06 DIAGNOSIS — F329 Major depressive disorder, single episode, unspecified: Secondary | ICD-10-CM | POA: Diagnosis present

## 2014-08-06 DIAGNOSIS — R569 Unspecified convulsions: Secondary | ICD-10-CM | POA: Diagnosis not present

## 2014-08-06 DIAGNOSIS — N390 Urinary tract infection, site not specified: Secondary | ICD-10-CM | POA: Diagnosis not present

## 2014-08-06 DIAGNOSIS — E876 Hypokalemia: Secondary | ICD-10-CM | POA: Diagnosis not present

## 2014-08-06 DIAGNOSIS — B9620 Unspecified Escherichia coli [E. coli] as the cause of diseases classified elsewhere: Secondary | ICD-10-CM | POA: Diagnosis present

## 2014-08-06 DIAGNOSIS — Y95 Nosocomial condition: Secondary | ICD-10-CM | POA: Diagnosis present

## 2014-08-06 DIAGNOSIS — A419 Sepsis, unspecified organism: Principal | ICD-10-CM | POA: Diagnosis present

## 2014-08-06 DIAGNOSIS — E1143 Type 2 diabetes mellitus with diabetic autonomic (poly)neuropathy: Secondary | ICD-10-CM | POA: Diagnosis present

## 2014-08-06 DIAGNOSIS — Z886 Allergy status to analgesic agent status: Secondary | ICD-10-CM | POA: Diagnosis not present

## 2014-08-06 DIAGNOSIS — E2740 Unspecified adrenocortical insufficiency: Secondary | ICD-10-CM | POA: Diagnosis present

## 2014-08-06 DIAGNOSIS — N412 Abscess of prostate: Secondary | ICD-10-CM | POA: Diagnosis not present

## 2014-08-06 DIAGNOSIS — R1311 Dysphagia, oral phase: Secondary | ICD-10-CM | POA: Diagnosis present

## 2014-08-06 DIAGNOSIS — R509 Fever, unspecified: Secondary | ICD-10-CM | POA: Diagnosis not present

## 2014-08-06 DIAGNOSIS — R112 Nausea with vomiting, unspecified: Secondary | ICD-10-CM | POA: Diagnosis not present

## 2014-08-06 DIAGNOSIS — K219 Gastro-esophageal reflux disease without esophagitis: Secondary | ICD-10-CM | POA: Diagnosis present

## 2014-08-06 DIAGNOSIS — R0602 Shortness of breath: Secondary | ICD-10-CM

## 2014-08-06 DIAGNOSIS — N12 Tubulo-interstitial nephritis, not specified as acute or chronic: Secondary | ICD-10-CM | POA: Diagnosis present

## 2014-08-06 DIAGNOSIS — R05 Cough: Secondary | ICD-10-CM | POA: Diagnosis not present

## 2014-08-06 DIAGNOSIS — G20 Parkinson's disease: Secondary | ICD-10-CM | POA: Diagnosis present

## 2014-08-06 DIAGNOSIS — R6510 Systemic inflammatory response syndrome (SIRS) of non-infectious origin without acute organ dysfunction: Secondary | ICD-10-CM

## 2014-08-06 DIAGNOSIS — D638 Anemia in other chronic diseases classified elsewhere: Secondary | ICD-10-CM | POA: Diagnosis present

## 2014-08-06 DIAGNOSIS — Z931 Gastrostomy status: Secondary | ICD-10-CM

## 2014-08-06 DIAGNOSIS — Z880 Allergy status to penicillin: Secondary | ICD-10-CM | POA: Diagnosis not present

## 2014-08-06 DIAGNOSIS — J690 Pneumonitis due to inhalation of food and vomit: Secondary | ICD-10-CM | POA: Diagnosis present

## 2014-08-06 DIAGNOSIS — Z79899 Other long term (current) drug therapy: Secondary | ICD-10-CM | POA: Diagnosis not present

## 2014-08-06 DIAGNOSIS — R311 Benign essential microscopic hematuria: Secondary | ICD-10-CM | POA: Diagnosis not present

## 2014-08-06 DIAGNOSIS — Z452 Encounter for adjustment and management of vascular access device: Secondary | ICD-10-CM | POA: Diagnosis not present

## 2014-08-06 DIAGNOSIS — A4151 Sepsis due to Escherichia coli [E. coli]: Secondary | ICD-10-CM | POA: Diagnosis not present

## 2014-08-06 DIAGNOSIS — G8250 Quadriplegia, unspecified: Secondary | ICD-10-CM | POA: Diagnosis present

## 2014-08-06 DIAGNOSIS — B964 Proteus (mirabilis) (morganii) as the cause of diseases classified elsewhere: Secondary | ICD-10-CM | POA: Diagnosis present

## 2014-08-06 HISTORY — DX: Systemic inflammatory response syndrome (sirs) of non-infectious origin without acute organ dysfunction: R65.10

## 2014-08-06 LAB — CBC WITH DIFFERENTIAL/PLATELET
BASOS ABS: 0 10*3/uL (ref 0.0–0.1)
Basophils Relative: 0 % (ref 0–1)
EOS ABS: 0.1 10*3/uL (ref 0.0–0.7)
Eosinophils Relative: 1 % (ref 0–5)
HCT: 36.2 % — ABNORMAL LOW (ref 39.0–52.0)
HEMOGLOBIN: 11.7 g/dL — AB (ref 13.0–17.0)
LYMPHS PCT: 15 % (ref 12–46)
Lymphs Abs: 1.3 10*3/uL (ref 0.7–4.0)
MCH: 23.8 pg — AB (ref 26.0–34.0)
MCHC: 32.3 g/dL (ref 30.0–36.0)
MCV: 73.7 fL — ABNORMAL LOW (ref 78.0–100.0)
Monocytes Absolute: 1.1 10*3/uL — ABNORMAL HIGH (ref 0.1–1.0)
Monocytes Relative: 13 % — ABNORMAL HIGH (ref 3–12)
NEUTROS ABS: 6.2 10*3/uL (ref 1.7–7.7)
NEUTROS PCT: 71 % (ref 43–77)
PLATELETS: 223 10*3/uL (ref 150–400)
RBC: 4.91 MIL/uL (ref 4.22–5.81)
RDW: 14.5 % (ref 11.5–15.5)
WBC: 8.7 10*3/uL (ref 4.0–10.5)

## 2014-08-06 LAB — COMPREHENSIVE METABOLIC PANEL
ALBUMIN: 3.6 g/dL (ref 3.5–5.0)
ALT: 36 U/L (ref 17–63)
ANION GAP: 10 (ref 5–15)
AST: 43 U/L — AB (ref 15–41)
Alkaline Phosphatase: 146 U/L — ABNORMAL HIGH (ref 38–126)
BUN: 20 mg/dL (ref 6–20)
CO2: 28 mmol/L (ref 22–32)
CREATININE: 0.75 mg/dL (ref 0.61–1.24)
Calcium: 8.6 mg/dL — ABNORMAL LOW (ref 8.9–10.3)
Chloride: 96 mmol/L — ABNORMAL LOW (ref 101–111)
GFR calc Af Amer: >60 mL/min (ref >60)
GFR calc non Af Amer: >60 mL/min (ref >60)
Glucose, Bld: 90 mg/dL (ref 70–99)
POTASSIUM: 3.6 mmol/L (ref 3.5–5.1)
Sodium: 134 mmol/L — ABNORMAL LOW (ref 135–145)
Total Bilirubin: 0.3 mg/dL (ref 0.3–1.2)
Total Protein: 8.7 g/dL — ABNORMAL HIGH (ref 6.5–8.1)

## 2014-08-06 LAB — URINE MICROSCOPIC-ADD ON

## 2014-08-06 LAB — GLUCOSE, CAPILLARY: Glucose-Capillary: 99 mg/dL (ref 70–99)

## 2014-08-06 LAB — URINALYSIS, ROUTINE W REFLEX MICROSCOPIC
Bilirubin Urine: NEGATIVE
Glucose, UA: NEGATIVE mg/dL
KETONES UR: NEGATIVE mg/dL
NITRITE: NEGATIVE
PROTEIN: 100 mg/dL — AB
Specific Gravity, Urine: 1.031 — ABNORMAL HIGH (ref 1.005–1.030)
Urobilinogen, UA: 0.2 mg/dL (ref 0.0–1.0)
pH: 7 (ref 5.0–8.0)

## 2014-08-06 LAB — PROCALCITONIN: Procalcitonin: <0.1 ng/mL

## 2014-08-06 LAB — I-STAT CG4 LACTIC ACID, ED
LACTIC ACID, VENOUS: 1.15 mmol/L (ref 0.5–2.0)
LACTIC ACID, VENOUS: 0.66 mmol/L (ref 0.5–2.0)

## 2014-08-06 LAB — PHENYTOIN LEVEL, TOTAL: PHENYTOIN LVL: 8.7 ug/mL — AB (ref 10.0–20.0)

## 2014-08-06 MED ORDER — BISACODYL 10 MG RE SUPP: 10.0000 mg | Freq: Every day | RECTAL | Status: DC | PRN

## 2014-08-06 MED ORDER — SODIUM CHLORIDE 0.9 % IV SOLN
INTRAVENOUS | Status: AC
  Administered 2014-08-07: via INTRAVENOUS

## 2014-08-06 MED ORDER — ENOXAPARIN SODIUM 40 MG/0.4ML ~~LOC~~ SOLN
40.0000 mg | Freq: Every day | SUBCUTANEOUS | Status: DC
  Administered 2014-08-07 – 2014-08-12 (×7): 40 mg via SUBCUTANEOUS
  Filled 2014-08-06 (×8): qty 0.4

## 2014-08-06 MED ORDER — INSULIN ASPART 100 UNIT/ML ~~LOC~~ SOLN
0.0000 [IU] | Freq: Three times a day (TID) | SUBCUTANEOUS | Status: DC
  Administered 2014-08-07 – 2014-08-08 (×2): 1 [IU] via SUBCUTANEOUS
  Administered 2014-08-09: 2 [IU] via SUBCUTANEOUS

## 2014-08-06 MED ORDER — ONDANSETRON HCL 4 MG PO TABS: 4.0000 mg | ORAL_TABLET | Freq: Four times a day (QID) | ORAL | Status: DC | PRN

## 2014-08-06 MED ORDER — ONDANSETRON HCL 4 MG/2ML IJ SOLN: 4.0000 mg | Freq: Four times a day (QID) | INTRAMUSCULAR | Status: DC | PRN

## 2014-08-06 MED ORDER — PHENYTOIN 125 MG/5ML PO SUSP
100.0000 mg | Freq: Two times a day (BID) | ORAL | Status: DC
  Administered 2014-08-07 – 2014-08-13 (×13): 100 mg
  Filled 2014-08-06 (×16): qty 4

## 2014-08-06 MED ORDER — POLYVINYL ALCOHOL 1.4 % OP SOLN
1.0000 [drp] | Freq: Every day | OPHTHALMIC | Status: DC
  Administered 2014-08-07 – 2014-08-13 (×5): 1 [drp] via OPHTHALMIC
  Filled 2014-08-06: qty 15

## 2014-08-06 MED ORDER — LORATADINE 10 MG PO TABS
10.0000 mg | ORAL_TABLET | Freq: Every day | ORAL | Status: DC
  Administered 2014-08-07 – 2014-08-13 (×6): 10 mg
  Filled 2014-08-06 (×7): qty 1

## 2014-08-06 MED ORDER — BACLOFEN 10 MG PO TABS
10.0000 mg | ORAL_TABLET | Freq: Three times a day (TID) | ORAL | Status: DC
  Administered 2014-08-07 – 2014-08-13 (×18): 10 mg
  Filled 2014-08-06 (×23): qty 1

## 2014-08-06 MED ORDER — OSMOLITE 1.5 CAL PO LIQD
1000.0000 mL | ORAL | Status: DC
  Administered 2014-08-07 – 2014-08-08 (×2): 1000 mL
  Filled 2014-08-06 (×4): qty 1000

## 2014-08-06 MED ORDER — DEXTROSE 5 % IV SOLN
2.0000 g | Freq: Once | INTRAVENOUS | Status: AC
  Administered 2014-08-07: 2 g via INTRAVENOUS
  Filled 2014-08-06: qty 2

## 2014-08-06 MED ORDER — ACETAMINOPHEN 160 MG/5ML PO SOLN
1000.0000 mg | Freq: Once | ORAL | Status: AC
  Administered 2014-08-06: 1000 mg
  Filled 2014-08-06: qty 40.6

## 2014-08-06 MED ORDER — METOCLOPRAMIDE HCL 10 MG PO TABS
10.0000 mg | ORAL_TABLET | Freq: Three times a day (TID) | ORAL | Status: DC
  Administered 2014-08-07 – 2014-08-13 (×22): 10 mg via ORAL
  Filled 2014-08-06 (×30): qty 1

## 2014-08-06 MED ORDER — ONDANSETRON HCL 4 MG/2ML IJ SOLN
4.0000 mg | Freq: Once | INTRAMUSCULAR | Status: AC
  Administered 2014-08-06: 4 mg via INTRAVENOUS
  Filled 2014-08-06: qty 2

## 2014-08-06 MED ORDER — ACETAMINOPHEN 325 MG PO TABS
650.0000 mg | ORAL_TABLET | Freq: Four times a day (QID) | ORAL | Status: DC | PRN
  Administered 2014-08-07 – 2014-08-10 (×5): 650 mg via ORAL
  Filled 2014-08-06 (×5): qty 2

## 2014-08-06 MED ORDER — IOHEXOL 300 MG/ML  SOLN
100.0000 mL | Freq: Once | INTRAMUSCULAR | Status: AC | PRN
  Administered 2014-08-06: 100 mL via INTRAVENOUS

## 2014-08-06 MED ORDER — ACETAMINOPHEN 160 MG/5ML PO SOLN
650.0000 mg | Freq: Once | ORAL | Status: AC
  Administered 2014-08-06: 650 mg
  Filled 2014-08-06: qty 20.3

## 2014-08-06 MED ORDER — SACCHAROMYCES BOULARDII 250 MG PO CAPS
250.0000 mg | ORAL_CAPSULE | Freq: Two times a day (BID) | ORAL | Status: DC
  Administered 2014-08-07 – 2014-08-13 (×13): 250 mg via ORAL
  Filled 2014-08-06 (×15): qty 1

## 2014-08-06 MED ORDER — OLOPATADINE HCL 0.1 % OP SOLN
1.0000 [drp] | Freq: Every morning | OPHTHALMIC | Status: DC
  Administered 2014-08-07 – 2014-08-13 (×5): 1 [drp] via OPHTHALMIC
  Filled 2014-08-06: qty 5

## 2014-08-06 MED ORDER — FERROUS SULFATE 300 (60 FE) MG/5ML PO SYRP
300.0000 mg | ORAL_SOLUTION | Freq: Every day | ORAL | Status: DC
  Administered 2014-08-07 – 2014-08-13 (×6): 300 mg
  Filled 2014-08-06 (×7): qty 5

## 2014-08-06 MED ORDER — ACETAMINOPHEN 650 MG RE SUPP: 650.0000 mg | Freq: Four times a day (QID) | RECTAL | Status: DC | PRN

## 2014-08-06 MED ORDER — CEFTRIAXONE SODIUM 1 G IJ SOLR
1.0000 g | Freq: Once | INTRAMUSCULAR | Status: AC
  Administered 2014-08-06: 1 g via INTRAVENOUS
  Filled 2014-08-06: qty 10

## 2014-08-06 MED ORDER — IBUPROFEN 100 MG/5ML PO SUSP: 600.0000 mg | Freq: Once | ORAL | Status: DC

## 2014-08-06 MED ORDER — VANCOMYCIN HCL IN DEXTROSE 1-5 GM/200ML-% IV SOLN
1000.0000 mg | Freq: Once | INTRAVENOUS | Status: AC
  Administered 2014-08-07: 1000 mg via INTRAVENOUS
  Filled 2014-08-06: qty 200

## 2014-08-06 MED ORDER — FOLIC ACID 1 MG PO TABS
1.0000 mg | ORAL_TABLET | Freq: Every day | ORAL | Status: DC
  Administered 2014-08-07 – 2014-08-13 (×6): 1 mg via ORAL
  Filled 2014-08-06 (×7): qty 1

## 2014-08-06 MED ORDER — HYDROCORTISONE NA SUCCINATE PF 100 MG IJ SOLR
50.0000 mg | Freq: Once | INTRAMUSCULAR | Status: AC
  Administered 2014-08-06: 50 mg via INTRAVENOUS
  Filled 2014-08-06: qty 2

## 2014-08-06 MED ORDER — FAMOTIDINE 20 MG PO TABS
20.0000 mg | ORAL_TABLET | Freq: Two times a day (BID) | ORAL | Status: DC
  Administered 2014-08-07 – 2014-08-13 (×13): 20 mg via ORAL
  Filled 2014-08-06 (×15): qty 1

## 2014-08-06 MED ORDER — ADULT MULTIVITAMIN LIQUID CH
5.0000 mL | Freq: Every day | ORAL | Status: DC
  Administered 2014-08-07 – 2014-08-13 (×6): 5 mL
  Filled 2014-08-06 (×7): qty 5

## 2014-08-06 NOTE — ED Provider Notes (Signed)
CSN: [id number]     Arrival date & time 08/06/14  1547 History   First MD Initiated Contact with Patient 08/06/14 1604     Chief Complaint  Patient presents with  . Fever  . Cough     (Consider location/radiation/quality/duration/timing/severity/associated sxs/prior Treatment) Patient is a 59 y.o. male presenting with fever. The history is provided by the patient. No language interpreter was used.  Fever Max temp prior to arrival:  101 Temp source:  Oral Severity:  Moderate Onset quality:  Gradual Duration:  1 day Timing:  Constant Progression:  Worsening Chronicity:  New Relieved by:  Nothing Ineffective treatments:  None tried Associated symptoms: cough and headaches   Associated symptoms: no myalgias   Risk factors: no sick contacts     Past Medical History  Diagnosis Date  . Quadriplegia   . Depression   . Anemia   . Parkinson's disease   . Seizures   . Adrenal insufficiency   . Dysphagia 06/19/2013  . Aphasia 06/19/2013   Past Surgical History  Procedure Laterality Date  . Peg tube placement    . Peripherally inserted central catheter insertion    . Tonsillectomy  1962   Family History  Problem Relation Age of Onset  . Hypothyroidism Mother   . Hypertension Mother   . Diabetes Father    History  Substance Use Topics  . Smoking status: Never Smoker   . Smokeless tobacco: Never Used  . Alcohol Use: No    Review of Systems  Constitutional: Positive for fever.  Respiratory: Positive for cough.   Genitourinary: Negative for hematuria.  Musculoskeletal: Negative for myalgias.  Neurological: Positive for headaches.  All other systems reviewed and are negative.     Allergies  Aspirin; Nsaids; and Penicillins  Home Medications   Prior to Admission medications   Medication Sig Start Date End Date Taking? Authorizing Provider  Amino Acids-Protein Hydrolys (FEEDING SUPPLEMENT, PRO-STAT SUGAR FREE 64,) LIQD Place 30 mLs into feeding tube 3 (three)  times daily with meals. 03/13/14   Shanker Kristeen Mans, MD  baclofen (LIORESAL) 10 MG tablet Place 1 tablet (10 mg total) into feeding tube 3 (three) times daily. 01/28/14   Silver Huguenin Elgergawy, MD  famotidine (PEPCID) 40 MG/5ML suspension Place 2.5 mLs (20 mg total) into feeding tube 2 (two) times daily. 03/13/14   Shanker Kristeen Mans, MD  ferrous sulfate 220 (44 FE) MG/5ML solution Place 330 mg into feeding tube See admin instructions. Give 7.38ml  Per tube every morning    Historical Provider, MD  folic acid (FOLVITE) 1 MG tablet 1 mg by PEG Tube route daily.    Historical Provider, MD  hydrocortisone (CORTEF) 5 mg/mL SUSP Take 3 mLs (15 mg total) by mouth every evening. Patient taking differently: Place 15 mg into feeding tube every evening.  03/13/14   Shanker Kristeen Mans, MD  hydrocortisone (CORTEF) 5 mg/mL SUSP Place 5 mLs (25 mg total) into feeding tube daily at 6 (six) AM. 03/13/14   Jonetta Osgood, MD  loratadine (CLARITIN) 10 MG tablet 10 mg by PEG Tube route daily.    Historical Provider, MD  metoCLOPramide (REGLAN) 10 MG tablet Take 10 mg by mouth 2 (two) times daily after a meal.    Historical Provider, MD  Multiple Vitamins-Minerals (CENTAMIN) LIQD 5 Tubes by PEG Tube route daily.    Historical Provider, MD  Nutritional Supplements (FEEDING SUPPLEMENT, OSMOLITE 1.5 CAL,) LIQD Place 1,000 mLs into feeding tube continuous. 03/13/14   Shanker Jerilynn Mages  Ghimire, MD  olopatadine (PATANOL) 0.1 % ophthalmic solution Place 1 drop into both eyes every morning. 01/28/14   Silver Huguenin Elgergawy, MD  phenytoin (DILANTIN) 125 MG/5ML suspension Place 4 mLs (100 mg total) into feeding tube 2 (two) times daily. 03/13/14   Shanker Kristeen Mans, MD  polyvinyl alcohol (LIQUIFILM TEARS) 1.4 % ophthalmic solution Place 1 drop into both eyes daily. 01/28/14   Silver Huguenin Elgergawy, MD  saccharomyces boulardii (FLORASTOR) 250 MG capsule 250 mg by PEG Tube route 2 (two) times daily.    Historical Provider, MD  vancomycin (VANCOCIN)  50 mg/mL oral solution Place 2.5 mLs (125 mg total) into feeding tube every 3 (three) days. For 14 days from 04/09/14 04/09/14   Jonetta Osgood, MD  Water For Irrigation, Sterile (FREE WATER) SOLN Place 250 mLs into feeding tube every 4 (four) hours. 03/13/14   Shanker Kristeen Mans, MD   BP 117/71 mmHg  Pulse 103  Temp(Src) 101.5 F (38.6 C) (Oral)  Resp 27  SpO2 94% Physical Exam  Constitutional: He appears well-developed.  HENT:  Head: Normocephalic.  Eyes: Pupils are equal, round, and reactive to light.  Neck: Normal range of motion.  Cardiovascular: Regular rhythm.   tachycardia  Pulmonary/Chest: Effort normal.  rhonchi  Abdominal: Soft.  Musculoskeletal: Normal range of motion.  Skin: Skin is warm.  Nursing note and vitals reviewed.   ED Course  Procedures (including critical care time) Labs Review Labs Reviewed - No data to display  Imaging Review Dg Chest Port 1 View  08/06/2014   CLINICAL DATA:  Cough and fever  EXAM: PORTABLE CHEST - 1 VIEW  COMPARISON:  Chest radiograph March 10, 2014; chest CT June 02, 2014.  FINDINGS: There is no edema or consolidation. Heart size and pulmonary vascularity are normal. No adenopathy. No bone lesions.  IMPRESSION: No edema or consolidation.   Electronically Signed   By: Lowella Grip III M.D.   On: 08/06/2014 17:03     EKG Interpretation None      Results for orders placed or performed during the hospital encounter of 08/06/14  CBC WITH DIFFERENTIAL  Result Value Ref Range   WBC 8.7 4.0 - 10.5 K/uL   RBC 4.91 4.22 - 5.81 MIL/uL   Hemoglobin 11.7 (L) 13.0 - 17.0 g/dL   HCT 36.2 (L) 39.0 - 52.0 %   MCV 73.7 (L) 78.0 - 100.0 fL   MCH 23.8 (L) 26.0 - 34.0 pg   MCHC 32.3 30.0 - 36.0 g/dL   RDW 14.5 11.5 - 15.5 %   Platelets 223 150 - 400 K/uL   Neutrophils Relative % 71 43 - 77 %   Lymphocytes Relative 15 12 - 46 %   Monocytes Relative 13 (H) 3 - 12 %   Eosinophils Relative 1 0 - 5 %   Basophils Relative 0 0 - 1 %    Neutro Abs 6.2 1.7 - 7.7 K/uL   Lymphs Abs 1.3 0.7 - 4.0 K/uL   Monocytes Absolute 1.1 (H) 0.1 - 1.0 K/uL   Eosinophils Absolute 0.1 0.0 - 0.7 K/uL   Basophils Absolute 0.0 0.0 - 0.1 K/uL   Smear Review MORPHOLOGY UNREMARKABLE   Comprehensive metabolic panel  Result Value Ref Range   Sodium 134 (L) 135 - 145 mmol/L   Potassium 3.6 3.5 - 5.1 mmol/L   Chloride 96 (L) 101 - 111 mmol/L   CO2 28 22 - 32 mmol/L   Glucose, Bld 90 70 - 99 mg/dL  BUN 20 6 - 20 mg/dL   Creatinine, Ser 0.75 0.61 - 1.24 mg/dL   Calcium 8.6 (L) 8.9 - 10.3 mg/dL   Total Protein 8.7 (H) 6.5 - 8.1 g/dL   Albumin 3.6 3.5 - 5.0 g/dL   AST 43 (H) 15 - 41 U/L   ALT 36 17 - 63 U/L   Alkaline Phosphatase 146 (H) 38 - 126 U/L   Total Bilirubin 0.3 0.3 - 1.2 mg/dL   GFR calc non Af Amer >60 >60 mL/min   GFR calc Af Amer >60 >60 mL/min   Anion gap 10 5 - 15  Urinalysis, Routine w reflex microscopic  Result Value Ref Range   Color, Urine AMBER (A) YELLOW   APPearance TURBID (A) CLEAR   Specific Gravity, Urine 1.031 (H) 1.005 - 1.030   pH 7.0 5.0 - 8.0   Glucose, UA NEGATIVE NEGATIVE mg/dL   Hgb urine dipstick LARGE (A) NEGATIVE   Bilirubin Urine NEGATIVE NEGATIVE   Ketones, ur NEGATIVE NEGATIVE mg/dL   Protein, ur 100 (A) NEGATIVE mg/dL   Urobilinogen, UA 0.2 0.0 - 1.0 mg/dL   Nitrite NEGATIVE NEGATIVE   Leukocytes, UA MODERATE (A) NEGATIVE  Urine microscopic-add on  Result Value Ref Range   WBC, UA 21-50 <3 WBC/hpf   RBC / HPF 21-50 <3 RBC/hpf   Bacteria, UA MANY (A) RARE  I-Stat CG4 Lactic Acid, ED  (not at [facility])  Result Value Ref Range   Lactic Acid, Venous 1.15 0.5 - 2.0 mmol/L  I-Stat CG4 Lactic Acid, ED  (not at [facility])  Result Value Ref Range   Lactic Acid, Venous 0.66 0.5 - 2.0 mmol/L   Dg Chest Port 1 View  08/06/2014   CLINICAL DATA:  Cough and fever  EXAM: PORTABLE CHEST - 1 VIEW  COMPARISON:  Chest radiograph March 10, 2014; chest CT June 02, 2014.  FINDINGS: There is no edema or  consolidation. Heart size and pulmonary vascularity are normal. No adenopathy. No bone lesions.  IMPRESSION: No edema or consolidation.   Electronically Signed   By: Lowella Grip III M.D.   On: 08/06/2014 17:03     MDM  Pt remains febrile,   Urine shows infection,  Previous cultures show ecoli and proteus which were sensitive to Rocephin.   Pt given rocephin Iv.      Final diagnoses:  Pyelonephritis    Pt admitted to Dr. Hal Hope for treatment.     [place], PA-C 08/06/14 2133

## 2014-08-06 NOTE — ED Notes (Signed)
Bed: WA20 Expected date:  Expected time:  Means of arrival:  Comments: EMS 

## 2014-08-06 NOTE — ED Notes (Signed)
Per EMS-patient from [facility]. Tmax 102. Mother reports yellow sputum. Rhonchi noted on right upper and lower lobes. Gave Acetaminophen 650 mg per feeding tube at [facility]. Denies pain, SOB. VS: BP 100/66 SpO2 97% on RA. CBG 84 mg/dl.

## 2014-08-06 NOTE — Progress Notes (Signed)
CSW met with pt at bedside. Patient was not communicative. Mother was present.  Mother confirms that the pt comes from [facility] and is in [facility]. She states that the pt has been living there for at least over 5 years. Also, she states that pt presents to [facility] due to fever, upset stomach, and coughing.  Mother informed CSW that the pt does not fall often. She states that he does receive assistance with completing his ADL's, Mom states that she and the pt's father are the pt's primary support. Mom informed CSW that she lives in [place] and visits the pt frequently.   Mom informed CSW that she does not have any questions at this time.  Shirely/ Mom [phone]  Willette Brace [phone] ED CSW 08/06/2014 7:31 PM

## 2014-08-06 NOTE — ED Notes (Addendum)
Lactic 1.15

## 2014-08-06 NOTE — ED Provider Notes (Signed)
Medical screening examination/treatment/procedure(s) were conducted as a shared visit with non-physician practitioner(s) and myself.  I personally evaluated the patient during the encounter.  Pt has history of quadraplegia.  Presents to the ED with fever and tachypnea.  Will monitor bp closely.  Plan on sepsis workup.  Workup reveals uti.  Lactic acid normal.  Will consult with medical service for admission, IV abx   Dorie Rank, MD 08/06/14 2144

## 2014-08-06 NOTE — H&P (Signed)
[facility] History and Physical  [name] WUJ:[id number] DOB: 06-30-1955 DOA: 08/06/2014  Referring physician: Ms. Cheron Schaumann. PA. PCP: [name], MD  Specialists: None.  Chief Complaint: Fever and nausea vomiting.  History obtained from ER PA and patient's mother as patient has aphasia.  HPI: [name] is a 59 y.o. male with history of quadriplegia, adrenal insufficiency, seizures, diabetes mellitus type 2, aphasia, dysphagia on PEG tubes, chronic anemia was brought to the ER after patient's mother noticed that patient has been having fever with nausea and vomiting at the nursing facility. Patient's mother states that patient had at least 2-3 episodes of nausea and vomiting last afternoon but did not have any abdominal pain or diarrhea. Patient had another episode of vomiting in the ER. Patient was found to be febrile and tachycardic. Chest x-ray does not show any infiltrates UA shows features concerning for UTI and patient has been admitted for sepsis. On exam patient is not in distress and abdomen appears benign with PEG tube in place.   Review of Systems: As presented in the history of presenting illness, rest negative.  Past Medical History  Diagnosis Date  . Quadriplegia   . Depression   . Anemia   . Parkinson's disease   . Seizures   . Adrenal insufficiency   . Dysphagia 06/19/2013  . Aphasia 06/19/2013   Past Surgical History  Procedure Laterality Date  . Peg tube placement    . Peripherally inserted central catheter insertion    . Tonsillectomy  1962   Social History:  reports that he has never smoked. He has never used smokeless tobacco. He reports that he does not drink alcohol or use illicit drugs. Where does patient live nursing home. Can patient participate in ADLs? No.  Allergies  Allergen Reactions  . Aspirin Other (See Comments)    Unknown reaction; "blood doesn't clog too well" per mother.   . Nsaids Other (See Comments)     Unknown reaction  . Penicillins Hives and Other (See Comments)    Unknown reaction.  Pt has tolerated cephalosporins in the past.    Family History:  Family History  Problem Relation Age of Onset  . Hypothyroidism Mother   . Hypertension Mother   . Diabetes Father       Prior to Admission medications   Medication Sig Start Date End Date Taking? Authorizing Provider  acetaminophen (TYLENOL) 325 MG tablet Take 650 mg by mouth every 6 (six) hours as needed for fever (Give 2 tablets via g-tube every 6 hours as needed for a fever greater than 101 degrees F).   Yes Historical Provider, MD  baclofen (LIORESAL) 10 MG tablet Place 1 tablet (10 mg total) into feeding tube 3 (three) times daily. Patient taking differently: Place 10 mg into feeding tube 3 (three) times daily.  01/28/14  Yes Starleen Arms, MD  bisacodyl (DULCOLAX) 10 MG suppository Place 10 mg rectally daily as needed for moderate constipation. If constipation not relieved by milk of magnesia give one 10 mg suppository in 24hours   Yes Historical Provider, MD  famotidine (PEPCID) 20 MG tablet Take 20 mg by mouth 2 (two) times daily. Give 1 tablet via peg tube twice daily   Yes Historical Provider, MD  ferrous sulfate 220 (44 FE) MG/5ML solution Place 330 mg into feeding tube See admin instructions. Give 7.48ml  Per tube every morning   Yes Historical Provider, MD  folic acid (FOLVITE) 1 MG tablet 1 mg by PEG Tube route  daily.   Yes Historical Provider, MD  gentamicin-prednisoLONE 0.3-1 % ophthalmic drops Place 2 drops into the left eye 4 (four) times daily. 08/02/14 08/09/14 Yes Historical Provider, MD  hydrocortisone (CORTEF) 5 mg/mL SUSP Take 3 mLs (15 mg total) by mouth every evening. Patient taking differently: Place 15 mg into feeding tube every evening.  03/13/14  Yes Shanker Levora Dredge, MD  hydrocortisone (CORTEF) 5 mg/mL SUSP Place 5 mLs (25 mg total) into feeding tube daily at 6 (six) AM. 03/13/14  Yes Shanker Levora Dredge, MD   loratadine (CLARITIN) 10 MG tablet 10 mg by PEG Tube route daily.   Yes Historical Provider, MD  metoCLOPramide (REGLAN) 10 MG tablet Take 10 mg by mouth 4 (four) times daily -  before meals and at bedtime. Via Peg tube   Yes Historical Provider, MD  Multiple Vitamins-Minerals (CENTAMIN) LIQD 5 mLs by PEG Tube route daily.    Yes Historical Provider, MD  Nutritional Supplements (NUTRITIONAL SUPPLEMENT PO) Take 55 mLs by mouth every hour. Two Cal HN via pump via PEG. Continuous at 65ml/hour for 20 hours to provide 2200kcals per day. Hold feedings for 1 hour pre/post phenytoin administration   Yes Historical Provider, MD  olopatadine (PATANOL) 0.1 % ophthalmic solution Place 1 drop into both eyes every morning. Patient taking differently: Place 1 drop into both eyes every morning.  01/28/14  Yes Starleen Arms, MD  phenytoin (DILANTIN) 125 MG/5ML suspension Place 4 mLs (100 mg total) into feeding tube 2 (two) times daily. 03/13/14  Yes Shanker Levora Dredge, MD  polyvinyl alcohol (LIQUIFILM TEARS) 1.4 % ophthalmic solution Place 1 drop into both eyes daily. Patient taking differently: Place 1 drop into both eyes daily.  01/28/14  Yes Starleen Arms, MD  PRESCRIPTION MEDICATION Place 1 enema rectally daily. If constipation not relieved by bisacodyl suppository, give disposable Saline Enema rectally   Yes Historical Provider, MD  PRESCRIPTION MEDICATION 150 mLs by PEG Tube route every 4 (four) hours. Flush with 150 ml water every 4 hours   Yes Historical Provider, MD  PRESCRIPTION MEDICATION 30 mLs by PEG Tube route 3 (three) times daily. Water flushes of 30 ml pre/post meds   Yes Historical Provider, MD  saccharomyces boulardii (FLORASTOR) 250 MG capsule 250 mg by PEG Tube route.    Yes Historical Provider, MD  Amino Acids-Protein Hydrolys (FEEDING SUPPLEMENT, PRO-STAT SUGAR FREE 64,) LIQD Place 30 mLs into feeding tube 3 (three) times daily with meals. Patient not taking: Reported on 08/06/2014  03/13/14   Maretta Bees, MD  famotidine (PEPCID) 40 MG/5ML suspension Place 2.5 mLs (20 mg total) into feeding tube 2 (two) times daily. 03/13/14   Shanker Levora Dredge, MD  Nutritional Supplements (FEEDING SUPPLEMENT, OSMOLITE 1.5 CAL,) LIQD Place 1,000 mLs into feeding tube continuous. Patient not taking: Reported on 08/06/2014 03/13/14   Maretta Bees, MD  vancomycin (VANCOCIN) 50 mg/mL oral solution Place 2.5 mLs (125 mg total) into feeding tube every 3 (three) days. For 14 days from 04/09/14 Patient not taking: Reported on 08/06/2014 04/09/14   Maretta Bees, MD  Water For Irrigation, Sterile (FREE WATER) SOLN Place 250 mLs into feeding tube every 4 (four) hours. Patient not taking: Reported on 08/06/2014 03/13/14   Maretta Bees, MD    Physical Exam: Filed Vitals:   08/06/14 1828 08/06/14 1930 08/06/14 2041 08/06/14 2104  BP: 120/63  100/60   Pulse: 120 108 110   Temp:    103.2 F (39.6 C)  TempSrc:    Rectal  Resp: 31 35 34   SpO2: 99% 100% 97%      General:  Moderately built and poorly nourished.  Eyes: Anicteric no pallor.  ENT: No discharge from ears eyes nose and mouth.  Neck: No mass felt.  Cardiovascular: S1 and S2 heard.  Respiratory: No rhonchi or crepitations.  Abdomen: Soft nontender bowel sounds present. PEG tube in place.  Skin: No rash.  Musculoskeletal: No edema.  Psychiatric: Appears normal.  Neurologic: Alert awake oriented to his name patient has aphasia and has quadriplegia.  Labs on Admission:  Basic Metabolic Panel:  Recent Labs Lab 08/06/14 1719  NA 134*  K 3.6  CL 96*  CO2 28  GLUCOSE 90  BUN 20  CREATININE 0.75  CALCIUM 8.6*   Liver Function Tests:  Recent Labs Lab 08/06/14 1719  AST 43*  ALT 36  ALKPHOS 146*  BILITOT 0.3  PROT 8.7*  ALBUMIN 3.6   No results for input(s): LIPASE, AMYLASE in the last 168 hours. No results for input(s): AMMONIA in the last 168 hours. CBC:  Recent Labs Lab 08/06/14 1719   WBC 8.7  NEUTROABS 6.2  HGB 11.7*  HCT 36.2*  MCV 73.7*  PLT 223   Cardiac Enzymes: No results for input(s): CKTOTAL, CKMB, CKMBINDEX, TROPONINI in the last 168 hours.  BNP (last 3 results) No results for input(s): BNP in the last 8760 hours.  ProBNP (last 3 results)  Recent Labs  01/25/14 2225  PROBNP 1776.0*    CBG: No results for input(s): GLUCAP in the last 168 hours.  Radiological Exams on Admission: Dg Chest Port 1 View  08/06/2014   CLINICAL DATA:  Cough and fever  EXAM: PORTABLE CHEST - 1 VIEW  COMPARISON:  Chest radiograph March 10, 2014; chest CT June 02, 2014.  FINDINGS: There is no edema or consolidation. Heart size and pulmonary vascularity are normal. No adenopathy. No bone lesions.  IMPRESSION: No edema or consolidation.   Electronically Signed   By: Bretta Bang III M.D.   On: 08/06/2014 17:03    Assessment/Plan Principal Problem:   Sepsis Active Problems:   Adrenal insufficiency   Seizures   PEG (percutaneous endoscopic gastrostomy) status   Pyelonephritis   1. Sepsis likely source could be urinary tract infection - since patient has multiple episodes of vomiting I have ordered CT abdomen and pelvis. I have placed patient on vancomycin and cefepime until we get blood cultures and urine cultures results back. Check pro-calcitonin levels and continue with gentle hydration. I have placed patient on stress dose steroids. 2. History of arterial insufficiency - since patient has nausea and vomiting and has sepsis I'm holding off PEG tube hydrocortisone and place patient on IV hydrocortisone for stress dose. 3. History of seizures - continue Dilantin. 4. History of diabetes mellitus type 2 per charts patient is not on any medications. I placed patient on sliding-scale coverage. 5. Chronic anemia - follow CBC. 6. Quadriplegia with aphasia and dysphagia - on PEG tube feeds.   DVT Prophylaxis Lovenox.  Code Status: Full code.  Family Communication:  Patient's mother at the bedside.  Disposition Plan: Admit to inpatient. Likely stay 2-3 days.    Amaziah Ghosh N. [facility] Pager [phone].  If 7PM-7AM, please contact night-coverage [email or web address] Password [facility] 08/06/2014, 9:53 PM

## 2014-08-06 NOTE — Progress Notes (Signed)
MRN: [id number] Name: [name]  Sex: male Age: 59 y.o. DOB: 1955/12/29  [facility] #: [name] Facility/Room:214 Level Of Care: SNF Provider: Merrilee Chase D Emergency Contacts: Extended Emergency Contact Information Primary Emergency Contact: [name] Address: [address]          [place], [place] [address] [place] Home Phone: [phone] Mobile Phone: [phone] Relation: Mother Secondary Emergency Contact: [name] [place] Home Phone: [phone] Relation: Brother Father: [name], [name] [place] Mobile Phone: [phone]  Code Status: FULL  Allergies: Aspirin; Nsaids; and Penicillins  Chief Complaint  Patient presents with  . Acute Visit    HPI: Patient is 59 y.o. male who nursing has asked me to see for fever 102.1.  Past Medical History  Diagnosis Date  . Quadriplegia   . Depression   . Anemia   . Parkinson's disease   . Seizures   . Adrenal insufficiency   . Dysphagia 06/19/2013  . Aphasia 06/19/2013    Past Surgical History  Procedure Laterality Date  . Peg tube placement    . Peripherally inserted central catheter insertion    . Tonsillectomy  1962      Medication List       This list is accurate as of: 08/06/14  3:49 PM.  Always use your most recent med list.               baclofen 10 MG tablet  Commonly known as:  LIORESAL  Place 1 tablet (10 mg total) into feeding tube 3 (three) times daily.     CENTAMIN Liqd  5 Tubes by PEG Tube route daily.     famotidine 40 MG/5ML suspension  Commonly known as:  PEPCID  Place 2.5 mLs (20 mg total) into feeding tube 2 (two) times daily.     feeding supplement (OSMOLITE 1.5 CAL) Liqd  Place 1,000 mLs into feeding tube continuous.     feeding supplement (PRO-STAT SUGAR FREE 64) Liqd  Place 30 mLs into feeding tube 3 (three) times daily with meals.     ferrous sulfate 220 (44 FE) MG/5ML solution  Place 330 mg into feeding  tube See admin instructions. Give 7.58ml  Per tube every morning     folic acid 1 MG tablet  Commonly known as:  FOLVITE  1 mg by PEG Tube route daily.     free water Soln  Place 250 mLs into feeding tube every 4 (four) hours.     hydrocortisone 5 mg/mL Susp  Commonly known as:  CORTEF  Take 3 mLs (15 mg total) by mouth every evening.     hydrocortisone 5 mg/mL Susp  Commonly known as:  CORTEF  Place 5 mLs (25 mg total) into feeding tube daily at 6 (six) AM.     loratadine 10 MG tablet  Commonly known as:  CLARITIN  10 mg by PEG Tube route daily.     metoCLOPramide 10 MG tablet  Commonly known as:  REGLAN  Take 10 mg by mouth 2 (two) times daily after a meal.     olopatadine 0.1 % ophthalmic solution  Commonly known as:  PATANOL  Place 1 drop into both eyes every morning.     phenytoin 125 MG/5ML suspension  Commonly known as:  DILANTIN  Place 4 mLs (100 mg total) into feeding tube 2 (two) times daily.     polyvinyl alcohol 1.4 % ophthalmic solution  Commonly known as:  LIQUIFILM TEARS  Place  1 drop into both eyes daily.     saccharomyces boulardii 250 MG capsule  Commonly known as:  FLORASTOR  250 mg by PEG Tube route 2 (two) times daily.     vancomycin 50 mg/mL oral solution  Commonly known as:  VANCOCIN  Place 2.5 mLs (125 mg total) into feeding tube every 3 (three) days. For 14 days from 04/09/14        No orders of the defined types were placed in this encounter.    Immunization History  Administered Date(s) Administered  . Influenza Whole 12/31/2012  . Influenza-Unspecified 01/07/2014  . PPD Test 08/22/2008  . Pneumococcal-Unspecified 06/23/2010    History  Substance Use Topics  . Smoking status: Never Smoker   . Smokeless tobacco: Never Used  . Alcohol Use: No    Review of Systems  DATA OBTAINED: from nurse GENERAL:  + fevers,no fatigue, appetite changes SKIN: No itching, rash HEENT: No complaint RESPIRATORY: + cough this am, wheezing,  SOB CARDIAC: No chest pain, palpitations, lower extremity edema  GI: No abdominal pain, No N/V/D or constipation,pt said he vomited but it was more regurg GU: No dysuria, frequency or urgency, or incontinence  MUSCULOSKELETAL: No unrelieved bone/joint pain NEUROLOGIC: No headache, dizziness  PSYCHIATRIC: No overt anxiety or sadness  Filed Vitals:   08/06/14 1247  BP: 125/74  Pulse: 76  Temp: 102.1 F (38.9 C)  Resp: 20    Physical Exam  GENERAL APPEARANCE: Alert, looks early sick, not toxic  SKIN: No diaphoresis rash HEENT: Unremarkable RESPIRATORY: Breathing is even, mild increased work. Lung sounds are clear   CARDIOVASCULAR: Heart RRR no murmurs, rubs or gallops. No peripheral edema  GASTROINTESTINAL: Abdomen is soft, non-tender, not distended w/ normal bowel sounds.  GENITOURINARY: Bladder non tender, not distended  MUSCULOSKELETAL: contractures and wasting NEUROLOGIC: Cranial nerves 2-12 grossly intact PSYCHIATRIC: no behavioral issues  Patient Active Problem List   Diagnosis Date Noted  . Fever presenting with conditions classified elsewhere 03/16/2014  . Altered mental status 03/16/2014  . SIRS (systemic inflammatory response syndrome) 03/11/2014  . Acute bronchiolitis due to unspecified organism 03/11/2014  . Enteritis due to Clostridium difficile 03/11/2014  . Type 2 diabetes mellitus without complication 03/11/2014  . Septic shock 01/26/2014  . Sepsis 01/25/2014  . Fecal impaction 01/04/2014  . Acute encephalopathy 01/02/2014  . Acute respiratory failure with hypercapnia 01/02/2014  . Chronic anemia 01/02/2014  . Subdural hematoma 01/02/2014  . Pain in thoracic spine 12/26/2013  . PEG (percutaneous endoscopic gastrostomy) status 10/04/2013  . Rash and nonspecific skin eruption 07/08/2013  . Thrush 07/08/2013  . Heme positive stool 07/07/2013  . Severe sepsis 06/23/2013  . E. coli pyelonephritis 06/22/2013  . Leukocytosis 06/19/2013  . Bronchitis  06/19/2013  . Dysphagia 06/19/2013  . Aphasia 06/19/2013  . Fever, unspecified 06/19/2013  . Other convulsions 08/10/2012  . Iron deficiency anemia 08/10/2012  . Glucocorticoid deficiency 08/10/2012  . Paralysis agitans 08/10/2012  . Nausea vomiting and diarrhea 06/15/2012  . Fever 06/15/2012  . UTI (lower urinary tract infection) 06/15/2012  . Hypokalemia 11/10/2011  . Diabetic gastroparesis 11/08/2011  . Ileus 11/08/2011  . Hyponatremia 11/08/2011  . Adrenal insufficiency 11/08/2011  . Quadriplegia 11/08/2011  . Parkinson's disease   . Seizures   . Anemia   . Depression     CBC    Component Value Date/Time   WBC 7.3 03/12/2014 0600   WBC 7.0 09/24/2013   RBC 3.84* 03/12/2014 0600   HGB 8.6* 03/12/2014 0600  HCT 27.9* 03/12/2014 0600   PLT 274 03/12/2014 0600   MCV 72.7* 03/12/2014 0600   LYMPHSABS 1.9 03/11/2014 0558   MONOABS 0.6 03/11/2014 0558   EOSABS 0.5 03/11/2014 0558   BASOSABS 0.0 03/11/2014 0558    CMP     Component Value Date/Time   NA 140 03/12/2014 0600   NA 134* 09/24/2013   K 3.2* 03/12/2014 0600   CL 103 03/12/2014 0600   CO2 23 03/12/2014 0600   GLUCOSE 90 03/12/2014 0600   BUN 15 03/12/2014 0600   BUN 18 09/24/2013   CREATININE 0.44* 03/12/2014 0600   CREATININE 0.5* 09/24/2013   CALCIUM 8.9 03/12/2014 0600   PROT 8.1 03/11/2014 0558   ALBUMIN 2.8* 03/11/2014 0558   AST 25 03/11/2014 0558   ALT 10 03/11/2014 0558   ALKPHOS 151* 03/11/2014 0558   BILITOT 0.5 03/11/2014 0558   GFRNONAA >90 03/12/2014 0600   GFRAA >90 03/12/2014 0600    Assessment and Plan  No problem-specific assessment & plan notes found for this encounter.   Margit Hanks, MD

## 2014-08-06 NOTE — Assessment & Plan Note (Signed)
Review of pt's chart shows that every time he has a fever he is admitted with SIRS or PNA, UTI, C diff. Problem he is a quad so there are multiple potential problems. If he is worked up in ED he could probably be treated here if he had a PICC line. His mother is here and she always calls 911.

## 2014-08-07 DIAGNOSIS — R569 Unspecified convulsions: Secondary | ICD-10-CM

## 2014-08-07 DIAGNOSIS — Z931 Gastrostomy status: Secondary | ICD-10-CM

## 2014-08-07 LAB — COMPREHENSIVE METABOLIC PANEL
ALT: 37 U/L (ref 17–63)
ANION GAP: 10 (ref 5–15)
AST: 44 U/L — AB (ref 15–41)
Albumin: 3.1 g/dL — ABNORMAL LOW (ref 3.5–5.0)
Alkaline Phosphatase: 116 U/L (ref 38–126)
BILIRUBIN TOTAL: 0.4 mg/dL (ref 0.3–1.2)
BUN: 20 mg/dL (ref 6–20)
CALCIUM: 8.1 mg/dL — AB (ref 8.9–10.3)
CHLORIDE: 100 mmol/L — AB (ref 101–111)
CO2: 26 mmol/L (ref 22–32)
CREATININE: 0.65 mg/dL (ref 0.61–1.24)
GFR calc Af Amer: >60 mL/min (ref >60)
GFR calc non Af Amer: >60 mL/min (ref >60)
Glucose, Bld: 148 mg/dL — ABNORMAL HIGH (ref 65–99)
Potassium: 3.8 mmol/L (ref 3.5–5.1)
Sodium: 136 mmol/L (ref 135–145)
Total Protein: 7.4 g/dL (ref 6.5–8.1)

## 2014-08-07 LAB — CBC WITH DIFFERENTIAL/PLATELET
BASOS ABS: 0 10*3/uL (ref 0.0–0.1)
Basophils Relative: 0 % (ref 0–1)
EOS PCT: 0 % (ref 0–5)
Eosinophils Absolute: 0 10*3/uL (ref 0.0–0.7)
HEMATOCRIT: 32.8 % — AB (ref 39.0–52.0)
Hemoglobin: 10.2 g/dL — ABNORMAL LOW (ref 13.0–17.0)
LYMPHS ABS: 0.8 10*3/uL (ref 0.7–4.0)
LYMPHS PCT: 8 % — AB (ref 12–46)
MCH: 23 pg — ABNORMAL LOW (ref 26.0–34.0)
MCHC: 31.1 g/dL (ref 30.0–36.0)
MCV: 74 fL — AB (ref 78.0–100.0)
MONOS PCT: 7 % (ref 3–12)
Monocytes Absolute: 0.7 10*3/uL (ref 0.1–1.0)
Neutro Abs: 8.1 10*3/uL — ABNORMAL HIGH (ref 1.7–7.7)
Neutrophils Relative %: 85 % — ABNORMAL HIGH (ref 43–77)
Platelets: 185 10*3/uL (ref 150–400)
RBC: 4.43 MIL/uL (ref 4.22–5.81)
RDW: 14.7 % (ref 11.5–15.5)
WBC: 9.6 10*3/uL (ref 4.0–10.5)

## 2014-08-07 LAB — GLUCOSE, CAPILLARY
GLUCOSE-CAPILLARY: 142 mg/dL — AB (ref 65–99)
GLUCOSE-CAPILLARY: 99 mg/dL (ref 65–99)
Glucose-Capillary: 122 mg/dL — ABNORMAL HIGH (ref 65–99)
Glucose-Capillary: 115 mg/dL — ABNORMAL HIGH (ref 65–99)
Glucose-Capillary: 158 mg/dL — ABNORMAL HIGH (ref 65–99)

## 2014-08-07 LAB — PROTIME-INR
INR: 1.26 (ref 0.00–1.49)
Prothrombin Time: 16 seconds — ABNORMAL HIGH (ref 11.6–15.2)

## 2014-08-07 LAB — APTT: APTT: 34 s (ref 24–37)

## 2014-08-07 LAB — MRSA PCR SCREENING: MRSA BY PCR: NEGATIVE

## 2014-08-07 LAB — LACTIC ACID, PLASMA: Lactic Acid, Venous: 1.8 mmol/L (ref 0.5–2.0)

## 2014-08-07 MED ORDER — VANCOMYCIN HCL IN DEXTROSE 750-5 MG/150ML-% IV SOLN
750.0000 mg | Freq: Two times a day (BID) | INTRAVENOUS | Status: DC
  Administered 2014-08-07 – 2014-08-08 (×4): 750 mg via INTRAVENOUS
  Filled 2014-08-07 (×4): qty 150

## 2014-08-07 MED ORDER — HYDROCORTISONE NA SUCCINATE PF 100 MG IJ SOLR
50.0000 mg | Freq: Three times a day (TID) | INTRAMUSCULAR | Status: DC
  Administered 2014-08-07 – 2014-08-10 (×10): 50 mg via INTRAVENOUS
  Filled 2014-08-07 (×13): qty 1

## 2014-08-07 MED ORDER — GUAIFENESIN-CODEINE 100-10 MG/5ML PO SOLN
10.0000 mL | Freq: Four times a day (QID) | ORAL | Status: DC | PRN
  Administered 2014-08-07 – 2014-08-10 (×5): 10 mL
  Administered 2014-08-10: 5 mL
  Administered 2014-08-10 – 2014-08-12 (×2): 10 mL
  Filled 2014-08-07 (×7): qty 10

## 2014-08-07 MED ORDER — CEFEPIME HCL 1 G IJ SOLR
1.0000 g | Freq: Three times a day (TID) | INTRAMUSCULAR | Status: DC
  Administered 2014-08-07 – 2014-08-11 (×13): 1 g via INTRAVENOUS
  Filled 2014-08-07 (×14): qty 1

## 2014-08-07 MED ORDER — DOCUSATE SODIUM 100 MG PO CAPS
200.0000 mg | ORAL_CAPSULE | Freq: Two times a day (BID) | ORAL | Status: DC
Start: 2014-08-07 — End: 2014-08-08
  Administered 2014-08-07 (×2): 200 mg via ORAL
  Filled 2014-08-07 (×3): qty 2

## 2014-08-07 NOTE — Clinical Social Work Note (Signed)
Clinical Social Work Assessment  Patient Details  Name: [name] MRN: [id number] Date of Birth: Sep 18, 1955  Date of referral:  08/07/14               Reason for consult:  Facility Placement                Permission sought to share information with:  Facility Art therapist granted to share information::  Yes, Verbal Permission Granted  Name::        Agency::     Relationship::     Contact Information:     Housing/Transportation Living arrangements for the past 2 months:  [place] of Information:  Parent Patient Interpreter Needed:  None Criminal Activity/Legal Involvement Pertinent to Current Situation/Hospitalization:  No - Comment as needed Significant Relationships:  Parents Lives with:  Facility Resident Do you feel safe going back to the place where you live?  Yes Need for family participation in patient care:  Yes (Comment)  Care giving concerns:  CSW received consult that patient was admitted from [facility].    Social Worker assessment / plan:  CSW confirmed with patient's mother, [name] at bedside that patient will return to [facility] SN[facility] at discharge.   Employment status:  Disabled (Comment on whether or not currently receiving Disability) Insurance information:  Medicare, Medicaid [place] PT Recommendations:  Not assessed at this time Information / Referral to community resources:  [place]  Patient/Family's Response to care:  Patient's mother informed CSW that patient is a long term resident at [facility] and that they have been very pleased with them.   Patient/Family's Understanding of and Emotional Response to Diagnosis, Current Treatment, and Prognosis:   Emotional Assessment Appearance:  Appears older than stated age Attitude/Demeanor/Rapport:    Affect (typically observed):  Pleasant Orientation:  Oriented to Self Alcohol / Substance use:    Psych involvement (Current and /or in the  community):  No (Comment)  Discharge Needs  Concerns to be addressed:    Readmission within the last 30 days:    Current discharge risk:    Barriers to Discharge:      Standley Brooking, LCSW 08/07/2014, 4:56 PM

## 2014-08-07 NOTE — Evaluation (Addendum)
SLP Cancellation Note  Patient Details Name: Khadar Monger MRN: [id number] DOB: 01-17-1956   Cancelled treatment:       Reason Eval/Treat Not Completed: Other (comment) (order for swallow evaluation received, per chart review, pt has relied on tube feeding and has been NPO for several years due to level of dysphagia s/p CVA.  SLP can not definitively determine if pt is aspirating tube feeding.  Note frequent vomiting premorbidly.   SNF MD indicates pt is to have "tube feeding forever" per 05/07/14 note.  SNF MD also questioned if pt could have gastroparesis due to vomiting with tube feed running and without.  Trial of Reglan indicated in SNF MD note from 7/93/90 but uncertain if initiated.    SLP does not have FEES capabilities at this venue but pt may be transferred to [facility] if MD deems indicated to evaluate for aspiration of secretions.  SLP apologizes for inconvenience.    Strict aspiration/reflux precautions indicated with this pt.   Luanna Salk, MS [facility] SLP [phone]   Addendum:  SLP spoke to MD re: order.  MD graciously clarified desire for SLP to help determine if pt is aspirating secretions or tube feeding.  SLP advised that can not definitively determine but will be happy to assist in any way possible.  MD agrees to trial of food coloring in Tube feeding to assess possible aspiration by evaluation of secretion color either coughed/expectorated or suctioned.  RN or RT to document in chart if green secretions suctioned or expectorated.  Will write order for testing and complete this next date.    Also, ? If pt would benefit from glucose testing of tracheobronchial secretions as positive test can indicate aspiration of enteral feedings per Sarina Ill, [place], 1998.

## 2014-08-07 NOTE — Progress Notes (Signed)
Initial Nutrition Assessment   DOCUMENTATION CODES:  Not applicable  INTERVENTION: - Continue Osmolite 1.5 @ 55 mL/hr with 50 mL free water Q4h; run time of 20 hours with Dilantin BID. This regimen is providing 1650 kcal, 69 grams protein, and 1138 mL free water - RD to continue to monitor for needs  NUTRITION DIAGNOSIS:  Inadequate oral intake related to inability to eat, dysphagia as evidenced by NPO status.  GOAL:  Patient will meet greater than or equal to 90% of their needs  MONITOR:  TF tolerance, Labs, Weight trends, I & O's  REASON FOR ASSESSMENT:  Low Braden, Other (Comment) (new TF)    ASSESSMENT: Per H&P, pt is a 59 y.o. male with history of quadriplegia, adrenal insufficiency, seizures, diabetes mellitus type 2, aphasia, dysphagia on PEG tube feeds, chronic anemia. Pt had 2-3 episodes of emesis 5/10 and another episode in the ED yesterday.  Pt is nonverbal and no family present. Physical assessment does not show muscle or fat wasting or edema at this time. Pt with PEG currently receiving Osmolite 1.5 @ 55 mL/hr with 50 mL free water Q4h. TF running over 20 hours due to Dilantin via PEG BID. This regimen is meeting pt's needs. Per chart review, pt receives Osmolite 1.5 at home with ProStat TID; no documentation of amount of TF given/day. Per weight hx review, pt lost 7 lbs (6% body weight) in 6 months which is not significant for time frame.  Will monitor for need to switch TF formula for pt with Type 2 DM. Labs and medications reviewed; CBGs: 99-142 mg/dL and Reglan order in place.  Height:  Ht Readings from Last 1 Encounters:  08/06/14 5\' 8"  (1.727 m)    Weight:  Wt Readings from Last 1 Encounters:  08/06/14 145 lb 11.6 oz (66.1 kg)    Ideal Body Weight:  70 kg (kg)  Wt Readings from Last 10 Encounters:  08/06/14 145 lb 11.6 oz (66.1 kg)  03/13/14 152 lb (68.947 kg)  01/17/14 174 lb 13.2 oz (79.3 kg)  01/02/14 155 lb 6.8 oz (70.5 kg)  12/11/13 155 lb  6.4 oz (70.489 kg)  10/30/13 156 lb 3.2 oz (70.852 kg)  07/23/13 156 lb (70.761 kg)  06/24/13 159 lb 9.6 oz (72.394 kg)  05/27/13 158 lb 9.6 oz (71.94 kg)  06/15/12 150 lb 2.1 oz (68.1 kg)    BMI:  Body mass index is 22.16 kg/(m^2).  Estimated Nutritional Needs:  Kcal:  1400-1600  Protein:  60-80 grams  Fluid:  2L/day  Skin:  Reviewed, no issues  Diet Order:  Tube feeding  EDUCATION NEEDS:  No education needs identified at this time   Intake/Output Summary (Last 24 hours) at 08/07/14 1153 Last data filed at 08/07/14 1146  Gross per 24 hour  Intake 1117.17 ml  Output    400 ml  Net 717.17 ml    Last BM:  PTA   Jarome Matin, RD, LDN Inpatient Clinical Dietitian Pager # [phone] After hours/weekend pager # [phone]

## 2014-08-07 NOTE — Consult Note (Signed)
I have been asked to see the patient by Dr. Lala Lund, for evaluation and management of prostatic abscess.  History of present illness: This is a 59 year old male, quadriplegic, who presented to the emergency department yesterday afternoon with fever and altered mental status.the workup consisted of a urine analysis demonstrated microscopic hematuria, pyuria, and concern for infection.  A CT scan was obtained which demonstrated a small prostatic abscess and a inflamed posterior bladder wall.  We have been counseled for the management of the patient's prostatic abscess. The patient was admitted to the internal medicine service and he was placed on broad-spectrum antibiotics.  Since being started on antibiotics the patient has been afebrile.  He complains of no pain.  The patient is incontinent of her baseline, but has not had any episodes of urinary retention.  His last PVR was less than 50. The patient has had some emesis, once in the emergency department, but none since admission.  The patient is a phasic, and as such, the history was difficult to obtain as his family members are caregivers were not present at the time of the interview.  Review of systems: unable to obtain secondary to patient's mental status.  Patient Active Problem List   Diagnosis Date Noted  . Pyelonephritis 08/06/2014  . Nausea & vomiting   . Fever presenting with conditions classified elsewhere 03/16/2014  . Altered mental status 03/16/2014  . SIRS (systemic inflammatory response syndrome) 03/11/2014  . Acute bronchiolitis due to unspecified organism 03/11/2014  . Enteritis due to Clostridium difficile 03/11/2014  . Type 2 diabetes mellitus without complication 84/69/6295  . Septic shock 01/26/2014  . Sepsis 01/25/2014  . Fecal impaction 01/04/2014  . Acute encephalopathy 01/02/2014  . Acute respiratory failure with hypercapnia 01/02/2014  . Chronic anemia 01/02/2014  . Subdural hematoma 01/02/2014  . Pain in  thoracic spine 12/26/2013  . PEG (percutaneous endoscopic gastrostomy) status 10/04/2013  . Rash and nonspecific skin eruption 07/08/2013  . Thrush 07/08/2013  . Heme positive stool 07/07/2013  . Severe sepsis 06/23/2013  . E. coli pyelonephritis 06/22/2013  . Leukocytosis 06/19/2013  . Bronchitis 06/19/2013  . Dysphagia 06/19/2013  . Aphasia 06/19/2013  . Fever, unspecified 06/19/2013  . Other convulsions 08/10/2012  . Iron deficiency anemia 08/10/2012  . Glucocorticoid deficiency 08/10/2012  . Paralysis agitans 08/10/2012  . Nausea vomiting and diarrhea 06/15/2012  . Febrile illness, acute 06/15/2012  . UTI (lower urinary tract infection) 06/15/2012  . Hypokalemia 11/10/2011  . Diabetic gastroparesis 11/08/2011  . Ileus 11/08/2011  . Hyponatremia 11/08/2011  . Adrenal insufficiency 11/08/2011  . Quadriplegia 11/08/2011  . Parkinson's disease   . Seizures   . Anemia   . Depression     No current facility-administered medications on file prior to encounter.   Current Outpatient Prescriptions on File Prior to Encounter  Medication Sig Dispense Refill  . baclofen (LIORESAL) 10 MG tablet Place 1 tablet (10 mg total) into feeding tube 3 (three) times daily. (Patient taking differently: Place 10 mg into feeding tube 3 (three) times daily. ) 30 each 0  . ferrous sulfate 220 (44 FE) MG/5ML solution Place 330 mg into feeding tube See admin instructions. Give 7.22ml  Per tube every morning    . folic acid (FOLVITE) 1 MG tablet 1 mg by PEG Tube route daily.    . hydrocortisone (CORTEF) 5 mg/mL SUSP Take 3 mLs (15 mg total) by mouth every evening. (Patient taking differently: Place 15 mg into feeding tube every evening. )    .  hydrocortisone (CORTEF) 5 mg/mL SUSP Place 5 mLs (25 mg total) into feeding tube daily at 6 (six) AM.    . loratadine (CLARITIN) 10 MG tablet 10 mg by PEG Tube route daily.    . metoCLOPramide (REGLAN) 10 MG tablet Take 10 mg by mouth 4 (four) times daily -  before  meals and at bedtime. Via Peg tube    . Multiple Vitamins-Minerals (CENTAMIN) LIQD 5 mLs by PEG Tube route daily.     Marland Kitchen olopatadine (PATANOL) 0.1 % ophthalmic solution Place 1 drop into both eyes every morning. (Patient taking differently: Place 1 drop into both eyes every morning. ) 5 mL 12  . phenytoin (DILANTIN) 125 MG/5ML suspension Place 4 mLs (100 mg total) into feeding tube 2 (two) times daily. 237 mL 12  . polyvinyl alcohol (LIQUIFILM TEARS) 1.4 % ophthalmic solution Place 1 drop into both eyes daily. (Patient taking differently: Place 1 drop into both eyes daily. ) 15 mL 0  . saccharomyces boulardii (FLORASTOR) 250 MG capsule 250 mg by PEG Tube route.     . Amino Acids-Protein Hydrolys (FEEDING SUPPLEMENT, PRO-STAT SUGAR FREE 64,) LIQD Place 30 mLs into feeding tube 3 (three) times daily with meals. (Patient not taking: Reported on 08/06/2014) 900 mL 0  . famotidine (PEPCID) 40 MG/5ML suspension Place 2.5 mLs (20 mg total) into feeding tube 2 (two) times daily. (Patient not taking: Reported on 08/07/2014) 50 mL 0  . Nutritional Supplements (FEEDING SUPPLEMENT, OSMOLITE 1.5 CAL,) LIQD Place 1,000 mLs into feeding tube continuous. (Patient not taking: Reported on 08/06/2014)  0  . vancomycin (VANCOCIN) 50 mg/mL oral solution Place 2.5 mLs (125 mg total) into feeding tube every 3 (three) days. For 14 days from 04/09/14 (Patient not taking: Reported on 08/06/2014)    . Water For Irrigation, Sterile (FREE WATER) SOLN Place 250 mLs into feeding tube every 4 (four) hours. (Patient not taking: Reported on 08/06/2014)      Past Medical History  Diagnosis Date  . Quadriplegia   . Depression   . Anemia   . Parkinson's disease   . Seizures   . Adrenal insufficiency   . Dysphagia 06/19/2013  . Aphasia 06/19/2013    Past Surgical History  Procedure Laterality Date  . Peg tube placement    . Peripherally inserted central catheter insertion    . Tonsillectomy  1962    History  Substance Use Topics   . Smoking status: Never Smoker   . Smokeless tobacco: Never Used  . Alcohol Use: No    Family History  Problem Relation Age of Onset  . Hypothyroidism Mother   . Hypertension Mother   . Diabetes Father     PE: Filed Vitals:   08/06/14 2104 08/06/14 2257 08/06/14 2327 08/07/14 0606  BP:   112/55 100/58  Pulse:   100 74  Temp: 103.2 F (39.6 C) 101.6 F (38.7 C) 98.8 F (37.1 C) 98.9 F (37.2 C)  TempSrc: Rectal Rectal Oral Oral  Resp:   20 20  Height:   5\' 8"  (1.727 m)   Weight:   66.1 kg (145 lb 11.6 oz)   SpO2:   100% 100%   Patient appears to be in no acute distress  Difficult to assess patient's orientation and his aphasic state. Atraumatic normocephalic head No cervical or supraclavicular lymphadenopathy appreciated No increased work of breathing, no audible wheezes/rhonchi Regular sinus rhythm/rate Abdomen is soft, nontender, nondistended, no CVA or suprapubic tenderness - Peg tube in place Lower extremity  are slightly contracted but symmetric without significant edema Grossly neurologically intact No identifiable skin lesions   Recent Labs  08/06/14 1719 08/07/14 0545  WBC 8.7 9.6  HGB 11.7* 10.2*  HCT 36.2* 32.8*    Recent Labs  08/06/14 1719 08/07/14 0545  NA 134* 136  K 3.6 3.8  CL 96* 100*  CO2 28 26  GLUCOSE 90 148*  BUN 20 20  CREATININE 0.75 0.65  CALCIUM 8.6* 8.1*    Recent Labs  08/06/14 2330  INR 1.26   No results for input(s): LABURIN in the last 72 hours. Results for orders placed or performed during the hospital encounter of 08/06/14  Blood Culture (routine x 2)     Status: None (Preliminary result)   Collection Time: 08/06/14  5:00 PM  Result Value Ref Range Status   Specimen Description BLOOD RFARM  Final   Special Requests BOTTLES DRAWN AEROBIC ONLY 3CC  Final   Culture   Final           BLOOD CULTURE RECEIVED NO GROWTH TO DATE CULTURE WILL BE HELD FOR 5 DAYS BEFORE ISSUING A FINAL NEGATIVE REPORT Performed at  [facility]    Report Status PENDING  Incomplete  Blood Culture (routine x 2)     Status: None (Preliminary result)   Collection Time: 08/06/14  5:15 PM  Result Value Ref Range Status   Specimen Description BLOOD RAC  Final   Special Requests BOTTLES DRAWN AEROBIC AND ANAEROBIC 5CC  Final   Culture   Final           BLOOD CULTURE RECEIVED NO GROWTH TO DATE CULTURE WILL BE HELD FOR 5 DAYS BEFORE ISSUING A FINAL NEGATIVE REPORT Performed at [facility]    Report Status PENDING  Incomplete    Imaging: I have independently reviewed the patient's CT scan.  This demonstrates a small areao of dense material within the patient's right lateral lobe prostate concerning for a small prostatic abscess.  In addition, the patient had a posterior wall thickening and stranding consistent with Inflammation.  Imp: Patient presented with fever and altered mental status, he may well have a small prostatic abscess.  However, I think a trial of conservative management with broad-spectrum antibiotics is worth a try given that he is hemodynamically stable and otherwise asymptomatic.  If the patient fails to improve, he would need a trip to the operating room for a transurethral unroofing of the prostatic abscess.  Recommendations: Continue conservative therapy with IV antibiotics.  At this point, I don't think placing a catheter with the necessarily helpful.  We will continue to follow along, and intervene if necessary.   Thank you for involving me in this patient's care, I will continue to follow along. Louis Meckel W

## 2014-08-07 NOTE — Progress Notes (Signed)
ANTIBIOTIC CONSULT NOTE - INITIAL  Pharmacy Consult for Vancomycin, cefepime  Indication: UTI/sepsis  Allergies  Allergen Reactions  . Aspirin Other (See Comments)    Unknown reaction; "blood doesn't clog too well" per mother.   . Nsaids Other (See Comments)    Unknown reaction  . Penicillins Hives and Other (See Comments)    Unknown reaction.  Pt has tolerated cephalosporins in the past.    Patient Measurements: Height: 5\' 8"  (172.7 cm) Weight: 145 lb 11.6 oz (66.1 kg) IBW/kg (Calculated) : 68.4 Adjusted Body Weight:   Vital Signs: Temp: 98.8 F (37.1 C) (05/11 2327) Temp Source: Oral (05/11 2327) BP: 112/55 mmHg (05/11 2327) Pulse Rate: 100 (05/11 2327) Intake/Output from previous day: 05/11 0701 - 05/12 0700 In: 817.2 [I.V.:300; NG/GT:97.2; IV Piggyback:300] Out: 200 [Emesis/NG output:200] Intake/Output from this shift: Total I/O In: 757.2 [I.V.:300; Other:60; NG/GT:97.2; IV Piggyback:300] Out: -   Labs:  Recent Labs  08/06/14 1719  WBC 8.7  HGB 11.7*  PLT 223  CREATININE 0.75   Estimated Creatinine Clearance: 95.2 mL/min (by C-G formula based on Cr of 0.75). No results for input(s): VANCOTROUGH, VANCOPEAK, VANCORANDOM, GENTTROUGH, GENTPEAK, GENTRANDOM, TOBRATROUGH, TOBRAPEAK, TOBRARND, AMIKACINPEAK, AMIKACINTROU, AMIKACIN in the last 72 hours.   Microbiology: No results found for this or any previous visit (from the past 720 hour(s)).  Medical History: Past Medical History  Diagnosis Date  . Quadriplegia   . Depression   . Anemia   . Parkinson's disease   . Seizures   . Adrenal insufficiency   . Dysphagia 06/19/2013  . Aphasia 06/19/2013    Medications:  Anti-infectives    Start     Dose/Rate Route Frequency Ordered Stop   08/06/14 2330  vancomycin (VANCOCIN) IVPB 1000 mg/200 mL premix     1,000 mg 200 mL/hr over 60 Minutes Intravenous  Once 08/06/14 2327 08/07/14 0205   08/06/14 2330  ceFEPIme (MAXIPIME) 2 g in dextrose 5 % 50 mL IVPB     2  g 100 mL/hr over 30 Minutes Intravenous  Once 08/06/14 2327 08/07/14 0136   08/06/14 2015  cefTRIAXone (ROCEPHIN) 1 g in dextrose 5 % 50 mL IVPB     1 g 100 mL/hr over 30 Minutes Intravenous  Once 08/06/14 2013 08/06/14 2152     Assessment: Patient with UTI and sepsis.  Patient with quadriplegia so CrCl could be actually lower than report in EPIC. Will estimate that true renal function between 60-80 mL/min  Goal of Therapy:  Vancomycin trough level 15-20 mcg/ml  Cefepime dosed based on patient weight and renal function   Plan:  Measure antibiotic drug levels at steady state Follow up culture results  Vancomycin 750mg  iv q12hr Cefepime 1gm iv q8hr  [name], [name] 08/07/2014,5:10 AM

## 2014-08-07 NOTE — Progress Notes (Addendum)
Patient Demographics  [name], is a 59 y.o. male, DOB - 1956/01/19, ZOX:[id number]  Admit date - 08/06/2014   Admitting Physician Eduard Clos, MD  Outpatient Primary MD for the patient is No primary care provider on file.  LOS - 1   Chief Complaint  Patient presents with  . Fever  . Cough        Subjective:   Kasmer Seepersad today has, No headache, No chest pain, No abdominal pain - No Nausea, No new weakness tingling or numbness, No Cough - SOB. Come in indicates by head nod and eye closing.  Assessment & Plan    1. Sepsis due to prostatitis/prostate abscess along with UTI along with possible HCAP. Much improved after empiric IV vancomycin and cefepime, since pneumonias and differential will continue both for now. Monitor culture results. Have consulted urology for possible prostate abscess. Continue supportive care with IV fluids, aspiration precautions, note his feeding is through PEG tube. We will monitor for any overt signs of aspiration. Likely his infiltrate is atelectasis more than pneumonia but will monitor. Post void residual less than 50 mL.   2. Quadriplegia with dysphagia and aphasia - supportive care, nothing by mouth by mouth medications and feeding through PEG tube. PEG site appears clean.    3. GERD. On PPI.    4. DM type II on sliding scale continue.  No results found for: HGBA1C  CBG (last 3)   Recent Labs  08/06/14 2336 08/07/14 0603 08/07/14 0744  GLUCAP 99 142* 122*     6. Adrenal insufficiency. Currently on stress dose hydrocortisone will gently taper. On chronic hydrocortisone orally at home.    7. A OCD. Monitor.    8. Hx of seizures. On Dilantin continue.      Code Status: Full  Family Communication: None  present  Disposition Plan: SNF   Consults  urology   Procedures CT scan abdomen and pelvis with possible prostatitis/prostate abscess and UTI   DVT Prophylaxis  Lovenox    Lab Results  Component Value Date   PLT 185 08/07/2014    Medications  Scheduled Meds: . baclofen  10 mg Per Tube TID  . ceFEPime (MAXIPIME) IV  1 g Intravenous Q8H  . enoxaparin (LOVENOX) injection  40 mg Subcutaneous QHS  . famotidine  20 mg Oral BID  . ferrous sulfate  300 mg Per Tube Daily  . folic acid  1 mg Oral Daily  . hydrocortisone sod succinate (SOLU-CORTEF) inj  50 mg Intravenous 3 times per day  . insulin aspart  0-9 Units Subcutaneous TID WC  . loratadine  10 mg Per Tube Daily  . metoCLOPramide  10 mg Oral TID AC & HS  . multivitamin  5 mL Per Tube Daily  . olopatadine  1 drop Both Eyes q morning - 10a  . phenytoin  100 mg Per Tube BID  . polyvinyl alcohol  1 drop Both Eyes Daily  . saccharomyces boulardii  250 mg Oral BID  . vancomycin  750 mg Intravenous Q12H   Continuous Infusions: . sodium chloride 100 mL/hr at 08/07/14 0000  . feeding supplement (OSMOLITE 1.5 CAL) 1,000 mL (08/07/14 0114)   PRN Meds:.acetaminophen **OR** acetaminophen, bisacodyl, ondansetron **OR** ondansetron (ZOFRAN) IV  Antibiotics    Anti-infectives    Start     Dose/Rate Route Frequency Ordered Stop   08/07/14 1000  vancomycin (VANCOCIN) IVPB 750 mg/150 ml premix     750 mg 150 mL/hr over 60 Minutes Intravenous Every 12 hours 08/07/14 0518     08/07/14 0800  ceFEPIme (MAXIPIME) 1 g in dextrose 5 % 50 mL IVPB     1 g 100 mL/hr over 30 Minutes Intravenous Every 8 hours 08/07/14 0518     08/06/14 2330  vancomycin (VANCOCIN) IVPB 1000 mg/200 mL premix     1,000 mg 200 mL/hr over 60 Minutes Intravenous  Once 08/06/14 2327 08/07/14 0205   08/06/14 2330  ceFEPIme (MAXIPIME) 2 g in dextrose 5 % 50 mL IVPB     2 g 100 mL/hr over 30 Minutes Intravenous  Once 08/06/14 2327 08/07/14 0136   08/06/14 2015   cefTRIAXone (ROCEPHIN) 1 g in dextrose 5 % 50 mL IVPB     1 g 100 mL/hr over 30 Minutes Intravenous  Once 08/06/14 2013 08/06/14 2152        Objective:   Filed Vitals:   08/06/14 2104 08/06/14 2257 08/06/14 2327 08/07/14 0606  BP:   112/55 100/58  Pulse:   100 74  Temp: 103.2 F (39.6 C) 101.6 F (38.7 C) 98.8 F (37.1 C) 98.9 F (37.2 C)  TempSrc: Rectal Rectal Oral Oral  Resp:   20 20  Height:   5\' 8"  (1.727 m)   Weight:   66.1 kg (145 lb 11.6 oz)   SpO2:   100% 100%    Wt Readings from Last 3 Encounters:  08/06/14 66.1 kg (145 lb 11.6 oz)  03/13/14 68.947 kg (152 lb)  01/17/14 79.3 kg (174 lb 13.2 oz)     Intake/Output Summary (Last 24 hours) at 08/07/14 1044 Last data filed at 08/07/14 0300  Gross per 24 hour  Intake 817.17 ml  Output    200 ml  Net 617.17 ml     Physical Exam  Awake Alert, Non verbal, quadriplegic at baseline  [place].AT,PERRAL Supple Neck,No JVD, No cervical lymphadenopathy appriciated.  Symmetrical Chest wall movement, Good air movement bilaterally, CTAB RRR,No Gallops,Rubs or new Murmurs, No Parasternal Heave +ve B.Sounds, Abd Soft, No tenderness, No organomegaly appriciated, No rebound - guarding or rigidity. PEG site stable No Cyanosis, Clubbing or edema, No new Rash or bruise      Data Review   Micro Results Recent Results (from the past 240 hour(s))  Blood Culture (routine x 2)     Status: None (Preliminary result)   Collection Time: 08/06/14  5:00 PM  Result Value Ref Range Status   Specimen Description BLOOD RFARM  Final   Special Requests BOTTLES DRAWN AEROBIC ONLY 3CC  Final   Culture   Final           BLOOD CULTURE RECEIVED NO GROWTH TO DATE CULTURE WILL BE HELD FOR 5 DAYS BEFORE ISSUING A FINAL NEGATIVE REPORT Performed at [facility]    Report Status PENDING  Incomplete  Blood Culture (routine x 2)     Status: None (Preliminary result)   Collection Time: 08/06/14  5:15 PM  Result Value Ref Range Status    Specimen Description BLOOD RAC  Final   Special Requests BOTTLES DRAWN AEROBIC AND ANAEROBIC 5CC  Final   Culture   Final           BLOOD CULTURE RECEIVED NO GROWTH TO DATE CULTURE WILL BE  HELD FOR 5 DAYS BEFORE ISSUING A FINAL NEGATIVE REPORT Performed at [facility]    Report Status PENDING  Incomplete    Radiology Reports Ct Abdomen Pelvis W Contrast  08/06/2014   CLINICAL DATA:  Fever, cough, abdominal distention. Unable to tolerate contrast via the PEG. Patient is quadriplegic. Nausea and vomiting.  EXAM: CT ABDOMEN AND PELVIS WITH CONTRAST  TECHNIQUE: Multidetector CT imaging of the abdomen and pelvis was performed using the standard protocol following bolus administration of intravenous contrast.  CONTRAST:  OMNIPAQUE IOHEXOL 300 MG/ML  SOLN  COMPARISON:  CT chest abdomen and pelvis 03/11/2014  FINDINGS: Airspace infiltration in both lung bases, most prominent on the right. This suggests probable pneumonia versus edema. Coronary artery calcifications.  The liver, spleen, gallbladder, pancreas, adrenal glands, abdominal aorta, inferior vena cava, and retroperitoneal lymph nodes are unremarkable. Low-attenuation lesions in the left kidney consistent with cysts. Largest measures 2.4 cm diameter. No hydronephrosis in either kidney. Gastrostomy tube is positioned with balloon in the stomach. Mild infiltration in the subcutaneous fat around the tube insertion site. Stomach, small bowel, and colon are mostly decompressed. Scattered stool in the colon. No free air or free fluid in the abdomen. Abdominal wall musculature appears intact.  Pelvis: Appendix is normal. Stool-filled rectosigmoid colon. Prostate gland is enlarged since prior study with suggestion of low-attenuation and peripheral enhancement demonstrated in the right lobe of the prostate gland. This was not definitely present previously. This could represent prostate lesions such as prostatitis with focal abscess. There is diffuse  wall thickening of the bladder with asymmetric prominence in the bladder base. This may indicate cystitis although bladder hypertrophy or neoplasm are not excluded. No free or loculated pelvic fluid collections. No pelvic lymphadenopathy. Endplate compression demonstrated at L5, L3, T12, and T11 levels without change since prior study. No destructive bone lesions are identified.  IMPRESSION: Infiltration in both lung bases, mostly on the right suggesting pneumonia. Diffuse and asymmetric wall thickening of the bladder may indicate cystitis or infiltrating process such as neoplasm. Lesion in the right lobe of the prostate may represent prostatitis with prostatic abscess.   Electronically Signed   By: Burman Nieves M.D.   On: 08/06/2014 23:11   Dg Chest Port 1 View  08/06/2014   CLINICAL DATA:  Cough and fever  EXAM: PORTABLE CHEST - 1 VIEW  COMPARISON:  Chest radiograph March 10, 2014; chest CT June 02, 2014.  FINDINGS: There is no edema or consolidation. Heart size and pulmonary vascularity are normal. No adenopathy. No bone lesions.  IMPRESSION: No edema or consolidation.   Electronically Signed   By: Bretta Bang III M.D.   On: 08/06/2014 17:03     CBC  Recent Labs Lab 08/06/14 1719 08/07/14 0545  WBC 8.7 9.6  HGB 11.7* 10.2*  HCT 36.2* 32.8*  PLT 223 185  MCV 73.7* 74.0*  MCH 23.8* 23.0*  MCHC 32.3 31.1  RDW 14.5 14.7  LYMPHSABS 1.3 0.8  MONOABS 1.1* 0.7  EOSABS 0.1 0.0  BASOSABS 0.0 0.0    Chemistries   Recent Labs Lab 08/06/14 1719 08/07/14 0545  NA 134* 136  K 3.6 3.8  CL 96* 100*  CO2 28 26  GLUCOSE 90 148*  BUN 20 20  CREATININE 0.75 0.65  CALCIUM 8.6* 8.1*  AST 43* 44*  ALT 36 37  ALKPHOS 146* 116  BILITOT 0.3 0.4   ------------------------------------------------------------------------------------------------------------------ estimated creatinine clearance is 95.2 mL/min (by C-G formula based on Cr of  0.65). ------------------------------------------------------------------------------------------------------------------  No results for input(s): HGBA1C in the last 72 hours. ------------------------------------------------------------------------------------------------------------------ No results for input(s): CHOL, HDL, LDLCALC, TRIG, CHOLHDL, LDLDIRECT in the last 72 hours. ------------------------------------------------------------------------------------------------------------------ No results for input(s): TSH, T4TOTAL, T3FREE, THYROIDAB in the last 72 hours.  Invalid input(s): FREET3 ------------------------------------------------------------------------------------------------------------------ No results for input(s): VITAMINB12, FOLATE, FERRITIN, TIBC, IRON, RETICCTPCT in the last 72 hours.  Coagulation profile  Recent Labs Lab 08/06/14 2330  INR 1.26    No results for input(s): DDIMER in the last 72 hours.  Cardiac Enzymes No results for input(s): CKMB, TROPONINI, MYOGLOBIN in the last 168 hours.  Invalid input(s): CK ------------------------------------------------------------------------------------------------------------------ Invalid input(s): POCBNP   Time Spent in minutes  35   Susa Raring K M.D on 08/07/2014 at 10:44 AM  Between 7am to 7pm - Pager - [phone]  After 7pm go to [email or web address] - password [facility]  [facility]   Office  [phone]

## 2014-08-08 ENCOUNTER — Inpatient Hospital Stay (HOSPITAL_COMMUNITY): Payer: Medicare Other

## 2014-08-08 LAB — HEMOGLOBIN A1C
HEMOGLOBIN A1C: 5.3 % (ref 4.8–5.6)
Mean Plasma Glucose: 105 mg/dL

## 2014-08-08 LAB — BASIC METABOLIC PANEL
ANION GAP: 11 (ref 5–15)
BUN: 13 mg/dL (ref 6–20)
CALCIUM: 8.1 mg/dL — AB (ref 8.9–10.3)
CO2: 26 mmol/L (ref 22–32)
Chloride: 97 mmol/L — ABNORMAL LOW (ref 101–111)
Creatinine, Ser: 0.58 mg/dL — ABNORMAL LOW (ref 0.61–1.24)
GFR calc non Af Amer: >60 mL/min (ref >60)
GLUCOSE: 114 mg/dL — AB (ref 65–99)
POTASSIUM: 4 mmol/L (ref 3.5–5.1)
Sodium: 134 mmol/L — ABNORMAL LOW (ref 135–145)

## 2014-08-08 LAB — GLUCOSE, CAPILLARY
GLUCOSE-CAPILLARY: 117 mg/dL — AB (ref 65–99)
GLUCOSE-CAPILLARY: 111 mg/dL — AB (ref 65–99)
GLUCOSE-CAPILLARY: 125 mg/dL — AB (ref 65–99)
Glucose-Capillary: 131 mg/dL — ABNORMAL HIGH (ref 65–99)

## 2014-08-08 LAB — CBC
HEMATOCRIT: 32.6 % — AB (ref 39.0–52.0)
Hemoglobin: 10.6 g/dL — ABNORMAL LOW (ref 13.0–17.0)
MCH: 23.9 pg — AB (ref 26.0–34.0)
MCHC: 32.5 g/dL (ref 30.0–36.0)
MCV: 73.4 fL — AB (ref 78.0–100.0)
PLATELETS: 133 10*3/uL — AB (ref 150–400)
RBC: 4.44 MIL/uL (ref 4.22–5.81)
RDW: 14.6 % (ref 11.5–15.5)
WBC: 8.4 10*3/uL (ref 4.0–10.5)

## 2014-08-08 LAB — VANCOMYCIN, TROUGH: Vancomycin Tr: 8 ug/mL — ABNORMAL LOW (ref 10.0–20.0)

## 2014-08-08 MED ORDER — PANTOPRAZOLE SODIUM 40 MG IV SOLR
40.0000 mg | Freq: Two times a day (BID) | INTRAVENOUS | Status: DC
Start: 2014-08-08 — End: 2014-08-09
  Administered 2014-08-08 – 2014-08-09 (×3): 40 mg via INTRAVENOUS
  Filled 2014-08-08 (×4): qty 40

## 2014-08-08 MED ORDER — VANCOMYCIN HCL IN DEXTROSE 750-5 MG/150ML-% IV SOLN
750.0000 mg | Freq: Three times a day (TID) | INTRAVENOUS | Status: DC
  Administered 2014-08-09 – 2014-08-11 (×7): 750 mg via INTRAVENOUS
  Filled 2014-08-08 (×9): qty 150

## 2014-08-08 MED ORDER — DOCUSATE SODIUM 50 MG/5ML PO LIQD
200.0000 mg | Freq: Two times a day (BID) | ORAL | Status: DC
  Administered 2014-08-08 – 2014-08-13 (×8): 200 mg via ORAL
  Filled 2014-08-08 (×12): qty 20

## 2014-08-08 NOTE — Progress Notes (Signed)
Patient Demographics  [name], is a 59 y.o. male, DOB - Jul 14, 1955, QMV:[id number]  Admit date - 08/06/2014   Admitting Physician Eduard Clos, MD  Outpatient Primary MD for the patient is No primary care provider on file.  LOS - 2   Chief Complaint  Patient presents with  . Fever  . Cough        Subjective:   [name] today has, No headache, No chest pain, No abdominal pain - No Nausea, No new weakness tingling or numbness, No Cough - SOB. Come in indicates by head nod and eye closing. With some finger-pointing he is complaining of some throat discomfort.   Assessment & Plan    1. Sepsis due to prostatitis/prostate abscess along with UTI along with possible HCAP. Much improved after empiric IV vancomycin and cefepime, since pneumonias and differential will continue both for now. Monitor culture results. Have consulted urology for possible prostate abscess. For now urology wants to monitor on IV antibiotics.   Continue supportive care with IV fluids, aspiration precautions, note his feeding is through PEG tube. We will monitor for any overt signs of aspiration. Color has been added to tube feed will monitor if any colored feeding is being suctioned by respiratory therapist or nursing. Likely his infiltrate is atelectasis more than pneumonia but will monitor.    2. Quadriplegia with dysphagia and aphasia - supportive care, nothing by mouth by mouth medications and feeding through PEG tube. PEG site appears clean.    3. GERD. IV PPI.    4. DM type II on sliding scale continue.  Lab Results  Component Value Date   HGBA1C 5.3 08/07/2014    CBG (last 3)   Recent Labs  08/07/14 1655 08/07/14 2230 08/08/14 0756  GLUCAP 99 158* 117*     6. Adrenal  insufficiency. Currently on stress dose hydrocortisone will gently taper. On chronic hydrocortisone orally at home.    7. A OCD. Monitor.    8. Hx of seizures. On Dilantin continue.      Code Status: Full  Family Communication: Mom over the phone  Disposition Plan: SNF   Consults  urology   Procedures CT scan abdomen and pelvis with possible prostatitis/prostate abscess and UTI   DVT Prophylaxis  Lovenox    Lab Results  Component Value Date   PLT 133* 08/08/2014    Medications  Scheduled Meds: . baclofen  10 mg Per Tube TID  . ceFEPime (MAXIPIME) IV  1 g Intravenous Q8H  . docusate sodium  200 mg Oral BID  . enoxaparin (LOVENOX) injection  40 mg Subcutaneous QHS  . famotidine  20 mg Oral BID  . ferrous sulfate  300 mg Per Tube Daily  . folic acid  1 mg Oral Daily  . hydrocortisone sod succinate (SOLU-CORTEF) inj  50 mg Intravenous 3 times per day  . insulin aspart  0-9 Units Subcutaneous TID WC  . loratadine  10 mg Per Tube Daily  . metoCLOPramide  10 mg Oral TID AC & HS  . multivitamin  5 mL Per Tube Daily  . olopatadine  1 drop Both Eyes q morning - 10a  . pantoprazole (PROTONIX) IV  40 mg Intravenous Q12H  . phenytoin  100  mg Per Tube BID  . polyvinyl alcohol  1 drop Both Eyes Daily  . saccharomyces boulardii  250 mg Oral BID  . vancomycin  750 mg Intravenous Q12H   Continuous Infusions: . feeding supplement (OSMOLITE 1.5 CAL) 1,000 mL (08/08/14 0209)   PRN Meds:.acetaminophen **OR** acetaminophen, bisacodyl, guaiFENesin-codeine, ondansetron **OR** ondansetron (ZOFRAN) IV  Antibiotics    Anti-infectives    Start     Dose/Rate Route Frequency Ordered Stop   08/07/14 1000  vancomycin (VANCOCIN) IVPB 750 mg/150 ml premix     750 mg 150 mL/hr over 60 Minutes Intravenous Every 12 hours 08/07/14 0518     08/07/14 0800  ceFEPIme (MAXIPIME) 1 g in dextrose 5 % 50 mL IVPB     1 g 100 mL/hr over 30 Minutes Intravenous Every 8 hours 08/07/14 0518      08/06/14 2330  vancomycin (VANCOCIN) IVPB 1000 mg/200 mL premix     1,000 mg 200 mL/hr over 60 Minutes Intravenous  Once 08/06/14 2327 08/07/14 0205   08/06/14 2330  ceFEPIme (MAXIPIME) 2 g in dextrose 5 % 50 mL IVPB     2 g 100 mL/hr over 30 Minutes Intravenous  Once 08/06/14 2327 08/07/14 0136   08/06/14 2015  cefTRIAXone (ROCEPHIN) 1 g in dextrose 5 % 50 mL IVPB     1 g 100 mL/hr over 30 Minutes Intravenous  Once 08/06/14 2013 08/06/14 2152        Objective:   Filed Vitals:   08/07/14 1518 08/07/14 2118 08/07/14 2349 08/08/14 0544  BP: 110/52 132/66  117/70  Pulse: 98 89  102  Temp: 100.2 F (37.9 C)  100.4 F (38 C)   TempSrc: Oral Oral  Oral  Resp: 20 20  18   Height:      Weight:    68.1 kg (150 lb 2.1 oz)  SpO2: 100% 100%  100%    Wt Readings from Last 3 Encounters:  08/08/14 68.1 kg (150 lb 2.1 oz)  03/13/14 68.947 kg (152 lb)  01/17/14 79.3 kg (174 lb 13.2 oz)     Intake/Output Summary (Last 24 hours) at 08/08/14 1008 Last data filed at 08/08/14 0554  Gross per 24 hour  Intake   2380 ml  Output    850 ml  Net   1530 ml     Physical Exam  Awake Alert, Non verbal, quadriplegic at baseline  [place].AT,PERRAL Supple Neck,No JVD, No cervical lymphadenopathy appriciated.  Symmetrical Chest wall movement, Good air movement bilaterally, CTAB RRR,No Gallops,Rubs or new Murmurs, No Parasternal Heave +ve B.Sounds, Abd Soft, No tenderness, No organomegaly appriciated, No rebound - guarding or rigidity. PEG site stable No Cyanosis, Clubbing or edema, No new Rash or bruise      Data Review   Micro Results Recent Results (from the past 240 hour(s))  Blood Culture (routine x 2)     Status: None (Preliminary result)   Collection Time: 08/06/14  5:00 PM  Result Value Ref Range Status   Specimen Description BLOOD RFARM  Final   Special Requests BOTTLES DRAWN AEROBIC ONLY 3CC  Final   Culture   Final           BLOOD CULTURE RECEIVED NO GROWTH TO DATE CULTURE WILL BE  HELD FOR 5 DAYS BEFORE ISSUING A FINAL NEGATIVE REPORT Performed at [facility]    Report Status PENDING  Incomplete  Blood Culture (routine x 2)     Status: None (Preliminary result)   Collection Time: 08/06/14  5:15 PM  Result Value Ref Range Status   Specimen Description BLOOD RAC  Final   Special Requests BOTTLES DRAWN AEROBIC AND ANAEROBIC 5CC  Final   Culture   Final           BLOOD CULTURE RECEIVED NO GROWTH TO DATE CULTURE WILL BE HELD FOR 5 DAYS BEFORE ISSUING A FINAL NEGATIVE REPORT Performed at [facility]    Report Status PENDING  Incomplete  Urine culture     Status: None (Preliminary result)   Collection Time: 08/06/14  5:30 PM  Result Value Ref Range Status   Specimen Description URINE, CATHETERIZED  Final   Special Requests NONE  Final   Culture   Final    Culture reincubated for better growth Performed at [facility]    Report Status PENDING  Incomplete  MRSA PCR Screening     Status: None   Collection Time: 08/07/14 10:26 AM  Result Value Ref Range Status   MRSA by PCR NEGATIVE NEGATIVE Final    Comment:        The GeneXpert MRSA Assay (FDA approved for NASAL specimens only), is one component of a comprehensive MRSA colonization surveillance program. It is not intended to diagnose MRSA infection nor to guide or monitor treatment for MRSA infections.     Radiology Reports Ct Abdomen Pelvis W Contrast  08/06/2014   CLINICAL DATA:  Fever, cough, abdominal distention. Unable to tolerate contrast via the PEG. Patient is quadriplegic. Nausea and vomiting.  EXAM: CT ABDOMEN AND PELVIS WITH CONTRAST  TECHNIQUE: Multidetector CT imaging of the abdomen and pelvis was performed using the standard protocol following bolus administration of intravenous contrast.  CONTRAST:  OMNIPAQUE IOHEXOL 300 MG/ML  SOLN  COMPARISON:  CT chest abdomen and pelvis 03/11/2014  FINDINGS: Airspace infiltration in both lung bases, most prominent on the  right. This suggests probable pneumonia versus edema. Coronary artery calcifications.  The liver, spleen, gallbladder, pancreas, adrenal glands, abdominal aorta, inferior vena cava, and retroperitoneal lymph nodes are unremarkable. Low-attenuation lesions in the left kidney consistent with cysts. Largest measures 2.4 cm diameter. No hydronephrosis in either kidney. Gastrostomy tube is positioned with balloon in the stomach. Mild infiltration in the subcutaneous fat around the tube insertion site. Stomach, small bowel, and colon are mostly decompressed. Scattered stool in the colon. No free air or free fluid in the abdomen. Abdominal wall musculature appears intact.  Pelvis: Appendix is normal. Stool-filled rectosigmoid colon. Prostate gland is enlarged since prior study with suggestion of low-attenuation and peripheral enhancement demonstrated in the right lobe of the prostate gland. This was not definitely present previously. This could represent prostate lesions such as prostatitis with focal abscess. There is diffuse wall thickening of the bladder with asymmetric prominence in the bladder base. This may indicate cystitis although bladder hypertrophy or neoplasm are not excluded. No free or loculated pelvic fluid collections. No pelvic lymphadenopathy. Endplate compression demonstrated at L5, L3, T12, and T11 levels without change since prior study. No destructive bone lesions are identified.  IMPRESSION: Infiltration in both lung bases, mostly on the right suggesting pneumonia. Diffuse and asymmetric wall thickening of the bladder may indicate cystitis or infiltrating process such as neoplasm. Lesion in the right lobe of the prostate may represent prostatitis with prostatic abscess.   Electronically Signed   By: Burman Nieves M.D.   On: 08/06/2014 23:11   Dg Chest Port 1 View  08/06/2014   CLINICAL DATA:  Cough and fever  EXAM:  PORTABLE CHEST - 1 VIEW  COMPARISON:  Chest radiograph March 10, 2014; chest  CT June 02, 2014.  FINDINGS: There is no edema or consolidation. Heart size and pulmonary vascularity are normal. No adenopathy. No bone lesions.  IMPRESSION: No edema or consolidation.   Electronically Signed   By: Bretta Bang III M.D.   On: 08/06/2014 17:03     CBC  Recent Labs Lab 08/06/14 1719 08/07/14 0545 08/08/14 0455  WBC 8.7 9.6 8.4  HGB 11.7* 10.2* 10.6*  HCT 36.2* 32.8* 32.6*  PLT 223 185 133*  MCV 73.7* 74.0* 73.4*  MCH 23.8* 23.0* 23.9*  MCHC 32.3 31.1 32.5  RDW 14.5 14.7 14.6  LYMPHSABS 1.3 0.8  --   MONOABS 1.1* 0.7  --   EOSABS 0.1 0.0  --   BASOSABS 0.0 0.0  --     Chemistries   Recent Labs Lab 08/06/14 1719 08/07/14 0545 08/08/14 0455  NA 134* 136 134*  K 3.6 3.8 4.0  CL 96* 100* 97*  CO2 28 26 26   GLUCOSE 90 148* 114*  BUN 20 20 13   CREATININE 0.75 0.65 0.58*  CALCIUM 8.6* 8.1* 8.1*  AST 43* 44*  --   ALT 36 37  --   ALKPHOS 146* 116  --   BILITOT 0.3 0.4  --    ------------------------------------------------------------------------------------------------------------------ estimated creatinine clearance is 98.1 mL/min (by C-G formula based on Cr of 0.58). ------------------------------------------------------------------------------------------------------------------  Recent Labs  08/07/14 0550  HGBA1C 5.3   ------------------------------------------------------------------------------------------------------------------ No results for input(s): CHOL, HDL, LDLCALC, TRIG, CHOLHDL, LDLDIRECT in the last 72 hours. ------------------------------------------------------------------------------------------------------------------ No results for input(s): TSH, T4TOTAL, T3FREE, THYROIDAB in the last 72 hours.  Invalid input(s): FREET3 ------------------------------------------------------------------------------------------------------------------ No results for input(s): VITAMINB12, FOLATE, FERRITIN, TIBC, IRON, RETICCTPCT in the last  72 hours.  Coagulation profile  Recent Labs Lab 08/06/14 2330  INR 1.26    No results for input(s): DDIMER in the last 72 hours.  Cardiac Enzymes No results for input(s): CKMB, TROPONINI, MYOGLOBIN in the last 168 hours.  Invalid input(s): CK ------------------------------------------------------------------------------------------------------------------ Invalid input(s): POCBNP   Time Spent in minutes  35   Susa Raring K M.D on 08/08/2014 at 10:08 AM  Between 7am to 7pm - Pager - [phone]  After 7pm go to [email or web address] - password [email or web address]  [facility]   Office  [phone]

## 2014-08-08 NOTE — Progress Notes (Signed)
SLP Note:  Discussed pt with RN - concern for aspiration of tube feeds, with feeding held at this time due to amount of coughing. SLP to bring food coloring from [facility] on next date in order to add color to feeds to better assess for possible aspiration.    Germain Osgood, M.A. CCC-SLP [phone]

## 2014-08-08 NOTE — Progress Notes (Signed)
Urology Inpatient Progress Report Pyelonephritis [N12] Nausea & vomiting [R11.2] 08/06/2014  Intv/Subj: Patient had been doing well - afebrile.  No complaints. Febrile this PM, no complaints from patient - according to his mom.  Past Medical History  Diagnosis Date  . Quadriplegia   . Depression   . Anemia   . Parkinson's disease   . Seizures   . Adrenal insufficiency   . Dysphagia 06/19/2013  . Aphasia 06/19/2013   Current Facility-Administered Medications  Medication Dose Route Frequency Provider Last Rate Last Dose  . acetaminophen (TYLENOL) tablet 650 mg  650 mg Oral Q6H PRN Rise Patience, MD   650 mg at 08/08/14 1502   Or  . acetaminophen (TYLENOL) suppository 650 mg  650 mg Rectal Q6H PRN Rise Patience, MD      . baclofen (LIORESAL) tablet 10 mg  10 mg Per Tube TID Rise Patience, MD   10 mg at 08/08/14 1502  . bisacodyl (DULCOLAX) suppository 10 mg  10 mg Rectal Daily PRN Rise Patience, MD      . ceFEPIme (MAXIPIME) 1 g in dextrose 5 % 50 mL IVPB  1 g Intravenous Q8H Rise Patience, MD   1 g at 08/08/14 1503  . docusate (COLACE) 50 MG/5ML liquid 200 mg  200 mg Oral BID Thurnell Lose, MD      . enoxaparin (LOVENOX) injection 40 mg  40 mg Subcutaneous QHS Rise Patience, MD   40 mg at 08/07/14 2146  . famotidine (PEPCID) tablet 20 mg  20 mg Oral BID Rise Patience, MD   20 mg at 08/08/14 1024  . feeding supplement (OSMOLITE 1.5 CAL) liquid 1,000 mL  1,000 mL Per Tube Continuous Rise Patience, MD 55 mL/hr at 08/08/14 0209 1,000 mL at 08/08/14 0209  . ferrous sulfate 300 (60 FE) MG/5ML syrup 300 mg  300 mg Per Tube Daily Rise Patience, MD   300 mg at 08/08/14 1024  . folic acid (FOLVITE) tablet 1 mg  1 mg Oral Daily Rise Patience, MD   1 mg at 08/08/14 1023  . guaiFENesin-codeine 100-10 MG/5ML solution 10 mL  10 mL Per Tube Q6H PRN Thurnell Lose, MD   10 mL at 08/08/14 1503  . hydrocortisone sodium succinate  (SOLU-CORTEF) 100 MG injection 50 mg  50 mg Intravenous 3 times per day Rise Patience, MD   50 mg at 08/08/14 1455  . insulin aspart (novoLOG) injection 0-9 Units  0-9 Units Subcutaneous TID WC Rise Patience, MD   1 Units at 08/07/14 0755  . loratadine (CLARITIN) tablet 10 mg  10 mg Per Tube Daily Rise Patience, MD   10 mg at 08/08/14 1023  . metoCLOPramide (REGLAN) tablet 10 mg  10 mg Oral TID AC & HS Rise Patience, MD   10 mg at 08/08/14 1300  . multivitamin liquid 5 mL  5 mL Per Tube Daily Rise Patience, MD   5 mL at 08/08/14 1024  . olopatadine (PATANOL) 0.1 % ophthalmic solution 1 drop  1 drop Both Eyes q morning - 10a Rise Patience, MD   1 drop at 08/07/14 1012  . ondansetron (ZOFRAN) tablet 4 mg  4 mg Oral Q6H PRN Rise Patience, MD       Or  . ondansetron [facility]) injection 4 mg  4 mg Intravenous Q6H PRN Rise Patience, MD      . pantoprazole (PROTONIX) injection 40 mg  40 mg Intravenous Q12H Thurnell Lose, MD   40 mg at 08/08/14 1024  . phenytoin (DILANTIN) 125 MG/5ML suspension 100 mg  100 mg Per Tube BID Rise Patience, MD   100 mg at 08/08/14 1024  . polyvinyl alcohol (LIQUIFILM TEARS) 1.4 % ophthalmic solution 1 drop  1 drop Both Eyes Daily Rise Patience, MD   1 drop at 08/07/14 1013  . saccharomyces boulardii (FLORASTOR) capsule 250 mg  250 mg Oral BID Rise Patience, MD   250 mg at 08/08/14 1023  . vancomycin (VANCOCIN) IVPB 750 mg/150 ml premix  750 mg Intravenous Q12H Rise Patience, MD   750 mg at 08/08/14 1023     Objective: Vital: Filed Vitals:   08/07/14 2118 08/07/14 2349 08/08/14 0544 08/08/14 1447  BP: 132/66  117/70 122/62  Pulse: 89  102 99  Temp:  100.4 F (38 C)  101.7 F (38.7 C)  TempSrc: Oral  Oral Oral  Resp: 20  18 20   Height:      Weight:   68.1 kg (150 lb 2.1 oz)   SpO2: 100%  100% 99%   I/Os: I/O last 3 completed shifts: In: 3137.2 [I.V.:300; Other:60; NG/GT:2027.2; IV  Piggyback:750] Out: 850 [Urine:850]  Physical Exam:  General: Patient is in no apparent distress Lungs: Normal respiratory effort, chest expands symmetrically. GI: The abdomen is soft and nontender without mass. Ext: lower extremities symmetric   Lab Results:  Recent Labs  08/06/14 1719 08/07/14 0545 08/08/14 0455  WBC 8.7 9.6 8.4  HGB 11.7* 10.2* 10.6*  HCT 36.2* 32.8* 32.6*    Recent Labs  08/06/14 1719 08/07/14 0545 08/08/14 0455  NA 134* 136 134*  K 3.6 3.8 4.0  CL 96* 100* 97*  CO2 28 26 26   GLUCOSE 90 148* 114*  BUN 20 20 13   CREATININE 0.75 0.65 0.58*  CALCIUM 8.6* 8.1* 8.1*    Recent Labs  08/06/14 2330  INR 1.26   No results for input(s): LABURIN in the last 72 hours. Results for orders placed or performed during the hospital encounter of 08/06/14  Blood Culture (routine x 2)     Status: None (Preliminary result)   Collection Time: 08/06/14  5:00 PM  Result Value Ref Range Status   Specimen Description BLOOD RFARM  Final   Special Requests BOTTLES DRAWN AEROBIC ONLY 3CC  Final   Culture   Final           BLOOD CULTURE RECEIVED NO GROWTH TO DATE CULTURE WILL BE HELD FOR 5 DAYS BEFORE ISSUING A FINAL NEGATIVE REPORT Performed at [facility]    Report Status PENDING  Incomplete  Blood Culture (routine x 2)     Status: None (Preliminary result)   Collection Time: 08/06/14  5:15 PM  Result Value Ref Range Status   Specimen Description BLOOD RAC  Final   Special Requests BOTTLES DRAWN AEROBIC AND ANAEROBIC 5CC  Final   Culture   Final           BLOOD CULTURE RECEIVED NO GROWTH TO DATE CULTURE WILL BE HELD FOR 5 DAYS BEFORE ISSUING A FINAL NEGATIVE REPORT Performed at [facility]    Report Status PENDING  Incomplete  Urine culture     Status: None (Preliminary result)   Collection Time: 08/06/14  5:30 PM  Result Value Ref Range Status   Specimen Description URINE, CATHETERIZED  Final   Special Requests NONE  Final   Culture  Final    Culture reincubated for better growth Performed at [facility]    Report Status PENDING  Incomplete  MRSA PCR Screening     Status: None   Collection Time: 08/07/14 10:26 AM  Result Value Ref Range Status   MRSA by PCR NEGATIVE NEGATIVE Final    Comment:        The GeneXpert MRSA Assay (FDA approved for NASAL specimens only), is one component of a comprehensive MRSA colonization surveillance program. It is not intended to diagnose MRSA infection nor to guide or monitor treatment for MRSA infections.     Studies/Results:   Assessment:  Febrile again this PM, question of whether patient has prostatic abscess and whether that is the source of his fever.urine culture has been negative to date.  Plan: The patient initially responded quite well to the antibiotics and was afebrile for 36 hours.  He did spike a fever today.  He is without complaints, no clear indication of whether the his fever is from his prostate, aspiration pneumonia, decubitus ulcer etc.  Difficult to interpret from patient's clinical evaluation.  If the patient continues to spike, would consider a transrectal ultrasound or repeat imaging of the patient's prostate and some way.  He would benefit from a transurethral resection of the prostate to drain the abscess if in fact that is a true abscess and is the source of his problems.  Either way, the patient would likely need to be on antibiotics for at least 4 weeks to treat a prostate infection.  We will continue to follow him over the weekend.  Ardis Hughs 08/08/2014, 5:28 PM

## 2014-08-08 NOTE — Progress Notes (Signed)
ANTIBIOTIC CONSULT NOTE - follow up  Pharmacy Consult for Vancomycin, cefepime  Indication: UTI/sepsis  Allergies  Allergen Reactions  . Aspirin Other (See Comments)    Unknown reaction; "blood doesn't clog too well" per mother.   . Nsaids Other (See Comments)    Unknown reaction  . Penicillins Hives and Other (See Comments)    Unknown reaction.  Pt has tolerated cephalosporins in the past.    Patient Measurements: Height: 5\' 8"  (172.7 cm) Weight: 150 lb 2.1 oz (68.1 kg) IBW/kg (Calculated) : 68.4 Adjusted Body Weight:   Vital Signs: Temp: 100.4 F (38 C) (05/12 2349) Temp Source: Oral (05/13 0544) BP: 117/70 mmHg (05/13 0544) Pulse Rate: 102 (05/13 0544) Intake/Output from previous day: 05/12 0701 - 05/13 0700 In: 2380 [NG/GT:1930; IV Piggyback:450] Out: 850 [Urine:850] Intake/Output from this shift:    Labs:  Recent Labs  08/06/14 1719 08/07/14 0545 08/08/14 0455  WBC 8.7 9.6 8.4  HGB 11.7* 10.2* 10.6*  PLT 223 185 133*  CREATININE 0.75 0.65 0.58*   Estimated Creatinine Clearance: 98.1 mL/min (by C-G formula based on Cr of 0.58). No results for input(s): VANCOTROUGH, VANCOPEAK, VANCORANDOM, GENTTROUGH, GENTPEAK, GENTRANDOM, TOBRATROUGH, TOBRAPEAK, TOBRARND, AMIKACINPEAK, AMIKACINTROU, AMIKACIN in the last 72 hours.   Microbiology: Recent Results (from the past 720 hour(s))  Blood Culture (routine x 2)     Status: None (Preliminary result)   Collection Time: 08/06/14  5:00 PM  Result Value Ref Range Status   Specimen Description BLOOD RFARM  Final   Special Requests BOTTLES DRAWN AEROBIC ONLY 3CC  Final   Culture   Final           BLOOD CULTURE RECEIVED NO GROWTH TO DATE CULTURE WILL BE HELD FOR 5 DAYS BEFORE ISSUING A FINAL NEGATIVE REPORT Performed at [facility]    Report Status PENDING  Incomplete  Blood Culture (routine x 2)     Status: None (Preliminary result)   Collection Time: 08/06/14  5:15 PM  Result Value Ref Range Status   Specimen Description BLOOD RAC  Final   Special Requests BOTTLES DRAWN AEROBIC AND ANAEROBIC 5CC  Final   Culture   Final           BLOOD CULTURE RECEIVED NO GROWTH TO DATE CULTURE WILL BE HELD FOR 5 DAYS BEFORE ISSUING A FINAL NEGATIVE REPORT Performed at [facility]    Report Status PENDING  Incomplete  Urine culture     Status: None (Preliminary result)   Collection Time: 08/06/14  5:30 PM  Result Value Ref Range Status   Specimen Description URINE, CATHETERIZED  Final   Special Requests NONE  Final   Culture   Final    Culture reincubated for better growth Performed at [facility]    Report Status PENDING  Incomplete  MRSA PCR Screening     Status: None   Collection Time: 08/07/14 10:26 AM  Result Value Ref Range Status   MRSA by PCR NEGATIVE NEGATIVE Final    Comment:        The GeneXpert MRSA Assay (FDA approved for NASAL specimens only), is one component of a comprehensive MRSA colonization surveillance program. It is not intended to diagnose MRSA infection nor to guide or monitor treatment for MRSA infections.     Medical History: Past Medical History  Diagnosis Date  . Quadriplegia   . Depression   . Anemia   . Parkinson's disease   . Seizures   . Adrenal insufficiency   .  Dysphagia 06/19/2013  . Aphasia 06/19/2013    Medications:  Anti-infectives    Start     Dose/Rate Route Frequency Ordered Stop   08/07/14 1000  vancomycin (VANCOCIN) IVPB 750 mg/150 ml premix     750 mg 150 mL/hr over 60 Minutes Intravenous Every 12 hours 08/07/14 0518     08/07/14 0800  ceFEPIme (MAXIPIME) 1 g in dextrose 5 % 50 mL IVPB     1 g 100 mL/hr over 30 Minutes Intravenous Every 8 hours 08/07/14 0518     08/06/14 2330  vancomycin (VANCOCIN) IVPB 1000 mg/200 mL premix     1,000 mg 200 mL/hr over 60 Minutes Intravenous  Once 08/06/14 2327 08/07/14 0205   08/06/14 2330  ceFEPIme (MAXIPIME) 2 g in dextrose 5 % 50 mL IVPB     2 g 100 mL/hr over 30 Minutes  Intravenous  Once 08/06/14 2327 08/07/14 0136   08/06/14 2015  cefTRIAXone (ROCEPHIN) 1 g in dextrose 5 % 50 mL IVPB     1 g 100 mL/hr over 30 Minutes Intravenous  Once 08/06/14 2013 08/06/14 2152     Assessment: Sepsis due to prostatitis/prostate abscess along with UTI along with possible HCAP.  Patient with quadriplegia so CrCl could be actually lower than report in EPIC. Will estimate that true renal function between 60-80 mL/min.  Goal of Therapy:  Vancomycin trough level 15-20 mcg/ml  Cefepime dosed based on patient weight and renal function   Plan:  vanc trough tonight @ 2100  Continue Vancomycin 750mg  iv q12hr Continue Cefepime 1gm iv q8hr  Dolly Rias RPh 08/08/2014, 10:14 AM Pager [phone]

## 2014-08-09 LAB — URINE CULTURE: Colony Count: >=100000

## 2014-08-09 LAB — GLUCOSE, CAPILLARY
Glucose-Capillary: 115 mg/dL — ABNORMAL HIGH (ref 65–99)
Glucose-Capillary: 157 mg/dL — ABNORMAL HIGH (ref 65–99)
Glucose-Capillary: 105 mg/dL — ABNORMAL HIGH (ref 65–99)

## 2014-08-09 MED ORDER — POTASSIUM CHLORIDE IN NACL 20-0.9 MEQ/L-% IV SOLN
INTRAVENOUS | Status: DC
  Administered 2014-08-09 – 2014-08-10 (×2): via INTRAVENOUS
  Filled 2014-08-09 (×4): qty 1000

## 2014-08-09 MED ORDER — PANTOPRAZOLE SODIUM 40 MG PO PACK
40.0000 mg | PACK | Freq: Two times a day (BID) | ORAL | Status: DC
  Administered 2014-08-10 – 2014-08-13 (×7): 40 mg
  Filled 2014-08-09 (×10): qty 20

## 2014-08-09 NOTE — Progress Notes (Signed)
ANTIBIOTIC CONSULT NOTE - FOLLOW UP  Pharmacy Consult for vancomycin Indication: UTI, sepsis  Allergies  Allergen Reactions  . Aspirin Other (See Comments)    Unknown reaction; "blood doesn't clog too well" per mother.   . Nsaids Other (See Comments)    Unknown reaction  . Penicillins Hives and Other (See Comments)    Unknown reaction.  Pt has tolerated cephalosporins in the past.    Patient Measurements: Height: 5\' 8"  (172.7 cm) Weight: 149 lb 4 oz (67.7 kg) IBW/kg (Calculated) : 68.4 Adjusted Body Weight:   Vital Signs: Temp: 98.7 F (37.1 C) (05/14 0540) Temp Source: Oral (05/14 0540) BP: 109/62 mmHg (05/14 0540) Pulse Rate: 86 (05/14 0540) Intake/Output from previous day: 05/13 0701 - 05/14 0700 In: 220 [IV Piggyback:150] Out: 550 [Urine:550] Intake/Output from this shift: Total I/O In: 220 [Other:70; IV Piggyback:150] Out: 550 [Urine:550]  Labs:  Recent Labs  08/06/14 1719 08/07/14 0545 08/08/14 0455  WBC 8.7 9.6 8.4  HGB 11.7* 10.2* 10.6*  PLT 223 185 133*  CREATININE 0.75 0.65 0.58*   Estimated Creatinine Clearance: 97.6 mL/min (by C-G formula based on Cr of 0.58).  Recent Labs  08/08/14 2155  [place] 8*     Microbiology: Recent Results (from the past 720 hour(s))  Blood Culture (routine x 2)     Status: None (Preliminary result)   Collection Time: 08/06/14  5:00 PM  Result Value Ref Range Status   Specimen Description BLOOD RFARM  Final   Special Requests BOTTLES DRAWN AEROBIC ONLY 3CC  Final   Culture   Final           BLOOD CULTURE RECEIVED NO GROWTH TO DATE CULTURE WILL BE HELD FOR 5 DAYS BEFORE ISSUING A FINAL NEGATIVE REPORT Performed at [facility]    Report Status PENDING  Incomplete  Blood Culture (routine x 2)     Status: None (Preliminary result)   Collection Time: 08/06/14  5:15 PM  Result Value Ref Range Status   Specimen Description BLOOD RAC  Final   Special Requests BOTTLES DRAWN AEROBIC AND ANAEROBIC 5CC   Final   Culture   Final           BLOOD CULTURE RECEIVED NO GROWTH TO DATE CULTURE WILL BE HELD FOR 5 DAYS BEFORE ISSUING A FINAL NEGATIVE REPORT Performed at [facility]    Report Status PENDING  Incomplete  Urine culture     Status: None (Preliminary result)   Collection Time: 08/06/14  5:30 PM  Result Value Ref Range Status   Specimen Description URINE, CATHETERIZED  Final   Special Requests NONE  Final   Colony Count   Final    >=100,000 COLONIES/ML Performed at [facility]    Culture   Final    [place] Performed at [facility]    Report Status PENDING  Incomplete  MRSA PCR Screening     Status: None   Collection Time: 08/07/14 10:26 AM  Result Value Ref Range Status   MRSA by PCR NEGATIVE NEGATIVE Final    Comment:        The GeneXpert MRSA Assay (FDA approved for NASAL specimens only), is one component of a comprehensive MRSA colonization surveillance program. It is not intended to diagnose MRSA infection nor to guide or monitor treatment for MRSA infections.     Anti-infectives    Start     Dose/Rate Route Frequency Ordered Stop   08/09/14 0600  vancomycin (VANCOCIN) IVPB  750 mg/150 ml premix     750 mg 150 mL/hr over 60 Minutes Intravenous Every 8 hours 08/08/14 2316     08/07/14 1000  vancomycin (VANCOCIN) IVPB 750 mg/150 ml premix  Status:  Discontinued     750 mg 150 mL/hr over 60 Minutes Intravenous Every 12 hours 08/07/14 0518 08/08/14 2315   08/07/14 0800  ceFEPIme (MAXIPIME) 1 g in dextrose 5 % 50 mL IVPB     1 g 100 mL/hr over 30 Minutes Intravenous Every 8 hours 08/07/14 0518     08/06/14 2330  vancomycin (VANCOCIN) IVPB 1000 mg/200 mL premix     1,000 mg 200 mL/hr over 60 Minutes Intravenous  Once 08/06/14 2327 08/07/14 0205   08/06/14 2330  ceFEPIme (MAXIPIME) 2 g in dextrose 5 % 50 mL IVPB     2 g 100 mL/hr over 30 Minutes Intravenous  Once 08/06/14 2327 08/07/14 0136   08/06/14 2015  cefTRIAXone  (ROCEPHIN) 1 g in dextrose 5 % 50 mL IVPB     1 g 100 mL/hr over 30 Minutes Intravenous  Once 08/06/14 2013 08/06/14 2152      Assessment: Patient with low vancomycin level.  Prior doses charted correctly.    Goal of Therapy:  Vancomycin trough level 15-20 mcg/ml  Plan:  Measure antibiotic drug levels at steady state Follow up culture results change vancomycin to 750mg  iv q8hr  Tyler Deis, Shea Stakes [place] 08/09/2014,5:54 AM

## 2014-08-09 NOTE — Progress Notes (Signed)
This patient is receiving IV Protonix. Based on criteria approved by the Pharmacy and Therapeutics Committee, this medication is being converted to the equivalent oral dose form to be given via PEG. These criteria include: . The patient is eating (either orally or per tube) and/or has been taking other orally administered medications for at least 24 hours. . This patient has no evidence of active gastrointestinal bleeding or impaired GI absorption (gastrectomy, short bowel, patient on TNA or NPO).  If you have questions about this conversion, please contact the pharmacy department. Thank you.  Hershal Coria, PharmD, BCPS Pager: [phone] 08/09/2014 11:09 AM

## 2014-08-09 NOTE — Progress Notes (Signed)
Patient Demographics  [name], is a 59 y.o. male, DOB - 1955-04-13, WUJ:[id number]  Admit date - 08/06/2014   Admitting Physician Eduard Clos, MD  Outpatient Primary MD for the patient is No primary care provider on file.  LOS - 3   Chief Complaint  Patient presents with  . Fever  . Cough        Subjective:   Deacon Nicolosi today has, No headache, No chest pain, No abdominal pain - No Nausea, No new weakness tingling or numbness, No Cough - SOB. Come in indicates by head nod and eye closing. With some finger-pointing he is complaining of some throat discomfort.   Assessment & Plan    1. Sepsis due to prostatitis/prostate abscess along with UTI along with possible HCAP. Much improved after empiric IV vancomycin and cefepime, urology on board, for now urology trying to monitor on conservative treatment with IV antibiotics.   2. Quadriplegia with dysphagia and aphasia - supportive care, nothing by mouth by mouth medications and feeding through PEG tube. PEG site appears clean.    3. GERD. IV PPI.    4. DM type II on sliding scale continue.  Lab Results  Component Value Date   HGBA1C 5.3 08/07/2014    CBG (last 3)   Recent Labs  08/08/14 1702 08/08/14 2129 08/09/14 0748  GLUCAP 125* 131* 115*     6. Adrenal insufficiency. Currently on stress dose hydrocortisone will gently taper. On chronic hydrocortisone orally at home.    7. A OCD. Monitor.    8. Hx of seizures. On Dilantin continue.   9. Possible ongoing aspiration pneumonia. This likely is aspiration of tube feeds and oral secretions due to poor swallow reflex from underlying quadriplegia and muscular weakness. Explained to the mother that prognosis appears poor long-term. For now we'll make him  nothing by mouth with IV fluids, continue empiric IV antibiotics, speech therapy on board have requested to schedule a FEES study at [facility]. Patient will be transported for the same.     Code Status: Full  Family Communication: Mom over the phone 08-07-14, 08-09-14  Disposition Plan: SNF   Consults  urology   Procedures CT scan abdomen and pelvis with possible prostatitis/prostate abscess and UTI   DVT Prophylaxis  Lovenox    Lab Results  Component Value Date   PLT 133* 08/08/2014    Medications  Scheduled Meds: . baclofen  10 mg Per Tube TID  . ceFEPime (MAXIPIME) IV  1 g Intravenous Q8H  . docusate  200 mg Oral BID  . enoxaparin (LOVENOX) injection  40 mg Subcutaneous QHS  . famotidine  20 mg Oral BID  . ferrous sulfate  300 mg Per Tube Daily  . folic acid  1 mg Oral Daily  . hydrocortisone sod succinate (SOLU-CORTEF) inj  50 mg Intravenous 3 times per day  . insulin aspart  0-9 Units Subcutaneous TID WC  . loratadine  10 mg Per Tube Daily  . metoCLOPramide  10 mg Oral TID AC & HS  . multivitamin  5 mL Per Tube Daily  . olopatadine  1 drop Both Eyes q morning - 10a  . pantoprazole (PROTONIX) IV  40 mg Intravenous Q12H  . phenytoin  100 mg Per Tube BID  . polyvinyl alcohol  1 drop Both Eyes Daily  . saccharomyces boulardii  250 mg Oral BID  . vancomycin  750 mg Intravenous Q8H   Continuous Infusions: . 0.9 % NaCl with KCl 20 mEq / L     PRN Meds:.acetaminophen **OR** acetaminophen, bisacodyl, guaiFENesin-codeine, ondansetron **OR** ondansetron (ZOFRAN) IV  Antibiotics    Anti-infectives    Start     Dose/Rate Route Frequency Ordered Stop   08/09/14 0600  vancomycin (VANCOCIN) IVPB 750 mg/150 ml premix     750 mg 150 mL/hr over 60 Minutes Intravenous Every 8 hours 08/08/14 2316     08/07/14 1000  vancomycin (VANCOCIN) IVPB 750 mg/150 ml premix  Status:  Discontinued     750 mg 150 mL/hr over 60 Minutes Intravenous Every 12 hours 08/07/14 0518 08/08/14  2315   08/07/14 0800  ceFEPIme (MAXIPIME) 1 g in dextrose 5 % 50 mL IVPB     1 g 100 mL/hr over 30 Minutes Intravenous Every 8 hours 08/07/14 0518     08/06/14 2330  vancomycin (VANCOCIN) IVPB 1000 mg/200 mL premix     1,000 mg 200 mL/hr over 60 Minutes Intravenous  Once 08/06/14 2327 08/07/14 0205   08/06/14 2330  ceFEPIme (MAXIPIME) 2 g in dextrose 5 % 50 mL IVPB     2 g 100 mL/hr over 30 Minutes Intravenous  Once 08/06/14 2327 08/07/14 0136   08/06/14 2015  cefTRIAXone (ROCEPHIN) 1 g in dextrose 5 % 50 mL IVPB     1 g 100 mL/hr over 30 Minutes Intravenous  Once 08/06/14 2013 08/06/14 2152        Objective:   Filed Vitals:   08/08/14 1447 08/08/14 1816 08/08/14 2132 08/09/14 0540  BP: 122/62  112/62 109/62  Pulse: 99  92 86  Temp: 101.7 F (38.7 C) 99 F (37.2 C) 99.4 F (37.4 C) 98.7 F (37.1 C)  TempSrc: Oral Oral Oral Oral  Resp: 20  16 17   Height:      Weight:    67.7 kg (149 lb 4 oz)  SpO2: 99%  97% 97%    Wt Readings from Last 3 Encounters:  08/09/14 67.7 kg (149 lb 4 oz)  03/13/14 68.947 kg (152 lb)  01/17/14 79.3 kg (174 lb 13.2 oz)     Intake/Output Summary (Last 24 hours) at 08/09/14 1008 Last data filed at 08/09/14 0344  Gross per 24 hour  Intake    220 ml  Output    550 ml  Net   -330 ml     Physical Exam  Awake Alert, Non verbal, quadriplegic at baseline  [place].AT,PERRAL Supple Neck,No JVD, No cervical lymphadenopathy appriciated.  Symmetrical Chest wall movement, Good air movement bilaterally, coarse R basilar rales RRR,No Gallops,Rubs or new Murmurs, No Parasternal Heave +ve B.Sounds, Abd Soft, No tenderness, No organomegaly appriciated, No rebound - guarding or rigidity. PEG site stable No Cyanosis, Clubbing or edema, No new Rash or bruise      Data Review   Micro Results Recent Results (from the past 240 hour(s))  Blood Culture (routine x 2)     Status: None (Preliminary result)   Collection Time: 08/06/14  5:00 PM  Result Value Ref  Range Status   Specimen Description BLOOD RFARM  Final   Special Requests BOTTLES DRAWN AEROBIC ONLY 3CC  Final   Culture   Final           BLOOD CULTURE RECEIVED NO GROWTH  TO DATE CULTURE WILL BE HELD FOR 5 DAYS BEFORE ISSUING A FINAL NEGATIVE REPORT Performed at [facility]    Report Status PENDING  Incomplete  Blood Culture (routine x 2)     Status: None (Preliminary result)   Collection Time: 08/06/14  5:15 PM  Result Value Ref Range Status   Specimen Description BLOOD RAC  Final   Special Requests BOTTLES DRAWN AEROBIC AND ANAEROBIC 5CC  Final   Culture   Final           BLOOD CULTURE RECEIVED NO GROWTH TO DATE CULTURE WILL BE HELD FOR 5 DAYS BEFORE ISSUING A FINAL NEGATIVE REPORT Performed at [facility]    Report Status PENDING  Incomplete  Urine culture     Status: None (Preliminary result)   Collection Time: 08/06/14  5:30 PM  Result Value Ref Range Status   Specimen Description URINE, CATHETERIZED  Final   Special Requests NONE  Final   Colony Count   Final    >=100,000 COLONIES/ML Performed at [facility]    Culture   Final    GRAM NEGATIVE RODS Performed at [facility]    Report Status PENDING  Incomplete  MRSA PCR Screening     Status: None   Collection Time: 08/07/14 10:26 AM  Result Value Ref Range Status   MRSA by PCR NEGATIVE NEGATIVE Final    Comment:        The GeneXpert MRSA Assay (FDA approved for NASAL specimens only), is one component of a comprehensive MRSA colonization surveillance program. It is not intended to diagnose MRSA infection nor to guide or monitor treatment for MRSA infections.     Radiology Reports Ct Abdomen Pelvis W Contrast  08/06/2014   CLINICAL DATA:  Fever, cough, abdominal distention. Unable to tolerate contrast via the PEG. Patient is quadriplegic. Nausea and vomiting.  EXAM: CT ABDOMEN AND PELVIS WITH CONTRAST  TECHNIQUE: Multidetector CT imaging of the abdomen and pelvis was  performed using the standard protocol following bolus administration of intravenous contrast.  CONTRAST:  OMNIPAQUE IOHEXOL 300 MG/ML  SOLN  COMPARISON:  CT chest abdomen and pelvis 03/11/2014  FINDINGS: Airspace infiltration in both lung bases, most prominent on the right. This suggests probable pneumonia versus edema. Coronary artery calcifications.  The liver, spleen, gallbladder, pancreas, adrenal glands, abdominal aorta, inferior vena cava, and retroperitoneal lymph nodes are unremarkable. Low-attenuation lesions in the left kidney consistent with cysts. Largest measures 2.4 cm diameter. No hydronephrosis in either kidney. Gastrostomy tube is positioned with balloon in the stomach. Mild infiltration in the subcutaneous fat around the tube insertion site. Stomach, small bowel, and colon are mostly decompressed. Scattered stool in the colon. No free air or free fluid in the abdomen. Abdominal wall musculature appears intact.  Pelvis: Appendix is normal. Stool-filled rectosigmoid colon. Prostate gland is enlarged since prior study with suggestion of low-attenuation and peripheral enhancement demonstrated in the right lobe of the prostate gland. This was not definitely present previously. This could represent prostate lesions such as prostatitis with focal abscess. There is diffuse wall thickening of the bladder with asymmetric prominence in the bladder base. This may indicate cystitis although bladder hypertrophy or neoplasm are not excluded. No free or loculated pelvic fluid collections. No pelvic lymphadenopathy. Endplate compression demonstrated at L5, L3, T12, and T11 levels without change since prior study. No destructive bone lesions are identified.  IMPRESSION: Infiltration in both lung bases, mostly on the right suggesting pneumonia. Diffuse and  asymmetric wall thickening of the bladder may indicate cystitis or infiltrating process such as neoplasm. Lesion in the right lobe of the prostate may  represent prostatitis with prostatic abscess.   Electronically Signed   By: Burman Nieves M.D.   On: 08/06/2014 23:11   Dg Chest Port 1 View  08/08/2014   CLINICAL DATA:  Shortness of breath  EXAM: PORTABLE CHEST - 1 VIEW  COMPARISON:  08/06/2014  FINDINGS: Pneumonia has developed in the right lung, most pronounced in the right lower lobe. Left lung remains clear. No effusions. No cardiac enlargement. Chronic atherosclerosis of the aorta.  IMPRESSION: Development of pneumonia in the right lung, worst in the right lower lobe.   Electronically Signed   By: Paulina Fusi M.D.   On: 08/08/2014 11:01   Dg Chest Port 1 View  08/06/2014   CLINICAL DATA:  Cough and fever  EXAM: PORTABLE CHEST - 1 VIEW  COMPARISON:  Chest radiograph March 10, 2014; chest CT June 02, 2014.  FINDINGS: There is no edema or consolidation. Heart size and pulmonary vascularity are normal. No adenopathy. No bone lesions.  IMPRESSION: No edema or consolidation.   Electronically Signed   By: Bretta Bang III M.D.   On: 08/06/2014 17:03     CBC  Recent Labs Lab 08/06/14 1719 08/07/14 0545 08/08/14 0455  WBC 8.7 9.6 8.4  HGB 11.7* 10.2* 10.6*  HCT 36.2* 32.8* 32.6*  PLT 223 185 133*  MCV 73.7* 74.0* 73.4*  MCH 23.8* 23.0* 23.9*  MCHC 32.3 31.1 32.5  RDW 14.5 14.7 14.6  LYMPHSABS 1.3 0.8  --   MONOABS 1.1* 0.7  --   EOSABS 0.1 0.0  --   BASOSABS 0.0 0.0  --     Chemistries   Recent Labs Lab 08/06/14 1719 08/07/14 0545 08/08/14 0455  NA 134* 136 134*  K 3.6 3.8 4.0  CL 96* 100* 97*  CO2 28 26 26   GLUCOSE 90 148* 114*  BUN 20 20 13   CREATININE 0.75 0.65 0.58*  CALCIUM 8.6* 8.1* 8.1*  AST 43* 44*  --   ALT 36 37  --   ALKPHOS 146* 116  --   BILITOT 0.3 0.4  --    ------------------------------------------------------------------------------------------------------------------ estimated creatinine clearance is 97.6 mL/min (by C-G formula based on Cr of  0.58). ------------------------------------------------------------------------------------------------------------------  Recent Labs  08/07/14 0550  HGBA1C 5.3   ------------------------------------------------------------------------------------------------------------------ No results for input(s): CHOL, HDL, LDLCALC, TRIG, CHOLHDL, LDLDIRECT in the last 72 hours. ------------------------------------------------------------------------------------------------------------------ No results for input(s): TSH, T4TOTAL, T3FREE, THYROIDAB in the last 72 hours.  Invalid input(s): FREET3 ------------------------------------------------------------------------------------------------------------------ No results for input(s): VITAMINB12, FOLATE, FERRITIN, TIBC, IRON, RETICCTPCT in the last 72 hours.  Coagulation profile  Recent Labs Lab 08/06/14 2330  INR 1.26    No results for input(s): DDIMER in the last 72 hours.  Cardiac Enzymes No results for input(s): CKMB, TROPONINI, MYOGLOBIN in the last 168 hours.  Invalid input(s): CK ------------------------------------------------------------------------------------------------------------------ Invalid input(s): POCBNP   Time Spent in minutes  35   Susa Raring K M.D on 08/09/2014 at 10:08 AM  Between 7am to 7pm - Pager - [phone]  After 7pm go to [email or web address] - password [facility]  [facility]   Office  [phone]

## 2014-08-09 NOTE — Evaluation (Addendum)
Clinical/Bedside Swallow Evaluation Patient Details  Name: [name] MRN: [id number] Date of Birth: [birth year]  Today's Date: 08/09/2014 Time: SLP Start Time (ACUTE ONLY): 1301 SLP Stop Time (ACUTE ONLY): 1340 SLP Time Calculation (min) (ACUTE ONLY): 39 min  Past Medical History:  Past Medical History  Diagnosis Date  . Quadriplegia   . Depression   . Anemia   . Parkinson's disease   . Seizures   . Adrenal insufficiency   . Dysphagia 06/19/2013  . Aphasia 06/19/2013   Past Surgical History:  Past Surgical History  Procedure Laterality Date  . Peg tube placement    . Peripherally inserted central catheter insertion    . Tonsillectomy  1962   HPI:  Pt is a 59 y.o. male with history of quadriplegia, adrenal insufficiency, seizures, diabetes mellitus type 2, dysarthria, dysphagia on PEG tube feeds, chronic anemia, and Parkinson's disease. Per family, pt was receiving primary nutrition/medications via PEG, however was drinking thin liquids as desired. Pt/family are not aware of any previous objective testing to assess swallowing function. Swallow evaluation was ordered to assess oropharyngeal function with concern for aspiration of secretions versus tube feeds.   Assessment / Plan / Recommendation Clinical Impression  Pt shows signs concerning for aspiration with ice chips and thin liquids, consisting of wet vocal quality and delayed coughing. Pt/family report that PTA the pt was drinking thin liquids, and they wish to pursue objective testing to determine if PO intake can be resumed. Will plan to complete FEES at [facility] as early as can be scheduled - MD informed that this would be at the earliest on Monday. Would keep NPO pending FEES.  ADDENDUM: SLP called endoscopy at [facility] to schedule testing. Per voicemail, instructed to call back during regular hours, which does not include weekends. SLP left voicemail to contact acute rehab department at [facility] on Monday morning to schedule FEES  for that date.    Aspiration Risk  Severe    Diet Recommendation NPO   Medication Administration: Via alternative means    Other  Recommendations Oral Care Recommendations: Oral care QID   Follow Up Recommendations    TBA pending FEES    Pertinent Vitals/Pain n/a    SLP Swallow Goals     Swallow Study Prior Functional Status       General Other Pertinent Information: Pt is a 59 y.o. male with history of quadriplegia, adrenal insufficiency, seizures, diabetes mellitus type 2, dysarthria, dysphagia on PEG tube feeds, chronic anemia, and Parkinson's disease. Per family, pt was receiving primary nutrition/medications via PEG, however was drinking thin liquids as desired. Pt/family are not aware of any previous objective testing to assess swallowing function. Swallow evaluation was ordered to assess oropharyngeal function with concern for aspiration of secretions versus tube feeds. Type of Study: Bedside swallow evaluation Previous Swallow Assessment: none in chart - family says he has worked with SLP a long time ago but have not had objective testing for swallow Diet Prior to this Study: NPO;Other (Comment) (tube feedings on hold) Temperature Spikes Noted: Yes (101.7) Respiratory Status: Room air History of Recent Intubation: No Behavior/Cognition: Alert;Cooperative;Pleasant mood;Requires cueing Self-Feeding Abilities: Total assist Patient Positioning: Upright in bed Baseline Vocal Quality: Normal Volitional Cough: Weak    Oral/Motor/Sensory Function     Ice Chips Ice chips: Impaired Presentation: Spoon Oral Phase Impairments: Reduced lingual movement/coordination;Impaired anterior to posterior transit Oral Phase Functional Implications: Oral holding;Prolonged oral transit;Right anterior spillage Pharyngeal Phase Impairments: Suspected delayed Swallow;Wet Vocal Quality   Thin Liquid  Thin Liquid: Impaired Presentation: Spoon;Straw Oral Phase Impairments: Reduced labial  seal;Impaired anterior to posterior transit;Poor awareness of bolus Oral Phase Functional Implications: Right anterior spillage;Oral holding Pharyngeal  Phase Impairments: Suspected delayed Swallow;Wet Vocal Quality;Cough - Delayed    Nectar Thick Nectar Thick Liquid: Not tested   Honey Thick Honey Thick Liquid: Not tested   Puree Puree: Not tested   Solid   Solid: Not tested      Maxcine Ham, M.A. CCC-SLP [phone]  Maxcine Ham 08/09/2014,2:02 PM

## 2014-08-09 NOTE — Progress Notes (Signed)
Subjective: The patient has had temp spike yesterday, with aspiration and bilateral pneumonia, 2ndary tube feedings. IM has raised th question of comfort care. Urology following for (?) prostatic abscess, which has appeared to respond to antibiotics).   Objective: Vital signs in last 24 hours: Temp:  [98.7 F (37.1 C)-101.7 F (38.7 C)] 98.7 F (37.1 C) (05/14 0540) Pulse Rate:  [86-99] 86 (05/14 0540) Resp:  [16-20] 17 (05/14 0540) BP: (109-122)/(62) 109/62 mmHg (05/14 0540) SpO2:  [97 %-99 %] 97 % (05/14 0540) Weight:  [67.7 kg (149 lb 4 oz)] 67.7 kg (149 lb 4 oz) (05/14 0540)A  Intake/Output from previous day: 05/13 0701 - 05/14 0700 In: 220 [IV Piggyback:150] Out: 550 [Urine:550] Intake/Output this shift:    Past Medical History  Diagnosis Date  . Quadriplegia   . Depression   . Anemia   . Parkinson's disease   . Seizures   . Adrenal insufficiency   . Dysphagia 06/19/2013  . Aphasia 06/19/2013    Physical Exam:  Lungs - Normal respiratory effort, chest expands symmetrically.  Abdomen - Soft, non-tender & non-distended.  Lab Results:  Recent Labs  08/06/14 1719 08/07/14 0545 08/08/14 0455  WBC 8.7 9.6 8.4  HGB 11.7* 10.2* 10.6*  HCT 36.2* 32.8* 32.6*   BMET  Recent Labs  08/07/14 0545 08/08/14 0455  NA 136 134*  K 3.8 4.0  CL 100* 97*  CO2 26 26  GLUCOSE 148* 114*  BUN 20 13  CREATININE 0.65 0.58*  CALCIUM 8.1* 8.1*   No results for input(s): LABURIN in the last 72 hours. Results for orders placed or performed during the hospital encounter of 08/06/14  Blood Culture (routine x 2)     Status: None (Preliminary result)   Collection Time: 08/06/14  5:00 PM  Result Value Ref Range Status   Specimen Description BLOOD RFARM  Final   Special Requests BOTTLES DRAWN AEROBIC ONLY 3CC  Final   Culture   Final           BLOOD CULTURE RECEIVED NO GROWTH TO DATE CULTURE WILL BE HELD FOR 5 DAYS BEFORE ISSUING A FINAL NEGATIVE REPORT Performed at [facility]    Report Status PENDING  Incomplete  Blood Culture (routine x 2)     Status: None (Preliminary result)   Collection Time: 08/06/14  5:15 PM  Result Value Ref Range Status   Specimen Description BLOOD RAC  Final   Special Requests BOTTLES DRAWN AEROBIC AND ANAEROBIC 5CC  Final   Culture   Final           BLOOD CULTURE RECEIVED NO GROWTH TO DATE CULTURE WILL BE HELD FOR 5 DAYS BEFORE ISSUING A FINAL NEGATIVE REPORT Performed at [facility]    Report Status PENDING  Incomplete  Urine culture     Status: None (Preliminary result)   Collection Time: 08/06/14  5:30 PM  Result Value Ref Range Status   Specimen Description URINE, CATHETERIZED  Final   Special Requests NONE  Final   Colony Count   Final    >=100,000 COLONIES/ML Performed at [facility]    Culture   Final    [place] Performed at [facility]    Report Status PENDING  Incomplete  MRSA PCR Screening     Status: None   Collection Time: 08/07/14 10:26 AM  Result Value Ref Range Status   MRSA by PCR NEGATIVE NEGATIVE Final    Comment:  The GeneXpert MRSA Assay (FDA approved for NASAL specimens only), is one component of a comprehensive MRSA colonization surveillance program. It is not intended to diagnose MRSA infection nor to guide or monitor treatment for MRSA infections.     Studies/Results: Dg Chest Port 1 View  08/08/2014   CLINICAL DATA:  Shortness of breath  EXAM: PORTABLE CHEST - 1 VIEW  COMPARISON:  08/06/2014  FINDINGS: Pneumonia has developed in the right lung, most pronounced in the right lower lobe. Left lung remains clear. No effusions. No cardiac enlargement. Chronic atherosclerosis of the aorta.  IMPRESSION: Development of pneumonia in the right lung, worst in the right lower lobe.   Electronically Signed   By: Nelson Chimes M.D.   On: 08/08/2014 11:01    Assessment: Continuing to follow, but pt is very high risk for surgical intervention (  TURP). We will continue to follow, and perhaps consider repeat pelvic CT or prostatic MRI next week to better visualize the prostate  Plan: continue to follow.   Clementina Mareno I Lexandra Rettke 08/09/2014, 11:40 AM

## 2014-08-10 DIAGNOSIS — A4151 Sepsis due to Escherichia coli [E. coli]: Secondary | ICD-10-CM

## 2014-08-10 LAB — COMPREHENSIVE METABOLIC PANEL
ALT: 25 U/L (ref 17–63)
ANION GAP: 10 (ref 5–15)
AST: 30 U/L (ref 15–41)
Albumin: 2.6 g/dL — ABNORMAL LOW (ref 3.5–5.0)
Alkaline Phosphatase: 85 U/L (ref 38–126)
BILIRUBIN TOTAL: 0.4 mg/dL (ref 0.3–1.2)
BUN: 7 mg/dL (ref 6–20)
CO2: 29 mmol/L (ref 22–32)
CREATININE: 0.4 mg/dL — AB (ref 0.61–1.24)
Calcium: 8.1 mg/dL — ABNORMAL LOW (ref 8.9–10.3)
Chloride: 99 mmol/L — ABNORMAL LOW (ref 101–111)
GFR calc non Af Amer: >60 mL/min (ref >60)
Glucose, Bld: 114 mg/dL — ABNORMAL HIGH (ref 65–99)
Potassium: 3.2 mmol/L — ABNORMAL LOW (ref 3.5–5.1)
Sodium: 138 mmol/L (ref 135–145)
Total Protein: 7 g/dL (ref 6.5–8.1)

## 2014-08-10 LAB — CBC
HCT: 30 % — ABNORMAL LOW (ref 39.0–52.0)
Hemoglobin: 9.8 g/dL — ABNORMAL LOW (ref 13.0–17.0)
MCH: 23.6 pg — AB (ref 26.0–34.0)
MCHC: 32.7 g/dL (ref 30.0–36.0)
MCV: 72.1 fL — ABNORMAL LOW (ref 78.0–100.0)
Platelets: 161 10*3/uL (ref 150–400)
RBC: 4.16 MIL/uL — ABNORMAL LOW (ref 4.22–5.81)
RDW: 14.4 % (ref 11.5–15.5)
WBC: 3.5 10*3/uL — AB (ref 4.0–10.5)

## 2014-08-10 LAB — GLUCOSE, CAPILLARY
GLUCOSE-CAPILLARY: 108 mg/dL — AB (ref 65–99)
Glucose-Capillary: 105 mg/dL — ABNORMAL HIGH (ref 65–99)
Glucose-Capillary: 117 mg/dL — ABNORMAL HIGH (ref 65–99)
Glucose-Capillary: 99 mg/dL (ref 65–99)
Glucose-Capillary: 101 mg/dL — ABNORMAL HIGH (ref 65–99)

## 2014-08-10 MED ORDER — HYDROCORTISONE NA SUCCINATE PF 100 MG IJ SOLR
50.0000 mg | Freq: Two times a day (BID) | INTRAMUSCULAR | Status: DC
  Administered 2014-08-10 – 2014-08-11 (×2): 50 mg via INTRAVENOUS
  Filled 2014-08-10 (×4): qty 1

## 2014-08-10 NOTE — Progress Notes (Signed)
Patient Demographics  [name], is a 59 y.o. male, DOB - Jun 06, 1955, ZOX:[id number]  Admit date - 08/06/2014   Admitting Physician Eduard Clos, MD  Outpatient Primary MD for the patient is No primary care provider on file.  LOS - 4   Chief Complaint  Patient presents with  . Fever  . Cough      Summary  [name] is a 59 y.o. male with history of quadriplegia, adrenal insufficiency, seizures, diabetes mellitus type 2, aphasia, dysphagia on PEG tubes, chronic anemia was brought to the ER after patient's mother noticed that patient has been having fever with nausea and vomiting at the nursing facility.  At the hospital his workup was consistent with prostate abscess, urology was consulted, he also had evidence of ongoing aspiration and HCAP. Most likely he has underlying weakness from quadriplegia, poor gag reflex, developed worsening of his baseline weakness due to prostate abscess and then aspirated.   Subjective:   [name] today has, No headache, No chest pain, No abdominal pain - No Nausea, No new weakness tingling or numbness, No Cough - SOB. Come in indicates by head nod and eye closing. Feels better.   Assessment & Plan    1. Sepsis due to prostatitis/prostate abscess along with UTI along with possible HCAP. Much improved after empiric IV vancomycin and cefepime, urology on board, for now urology trying to monitor on conservative treatment with IV antibiotics. Urine cultures noted. Will continue current antibiotics in the light of ongoing aspiration. Clinically stable.    2. Possible ongoing aspiration pneumonia. This likely is aspiration of tube feeds and oral secretions due to poor swallow reflex from underlying quadriplegia and muscular weakness which is  worsened by sepsis from prostate abscess. Speech therapy involved, due for FEES study at [facility] on 08/11/2014. We'll keep nothing by mouth except for medications why a PEG tube till then, hopefully his gag reflex and swallowing will improve after underlying infection in the prostate is under control.   3. Quadriplegia with dysphagia and aphasia - supportive care, nothing by mouth by mouth medications through PEG tube. PEG site appears clean.    4. DM type II on sliding scale continue.  Lab Results  Component Value Date   HGBA1C 5.3 08/07/2014    CBG (last 3)   Recent Labs  08/09/14 1733 08/09/14 2229 08/10/14 0710  GLUCAP 105* 108* 105*     6. Adrenal insufficiency. Currently on stress dose hydrocortisone will gently taper. On chronic hydrocortisone orally at home.    7. A OCD. Monitor.    8. Hx of seizures. On Dilantin continue.    9. GERD. IV PPI.     Code Status: Full  Family Communication: Mom over the phone 08-07-14, 08-09-14  Disposition Plan: SNF   Consults  Urology   Procedures   CT scan abdomen and pelvis with possible prostatitis/prostate abscess and UTI      DVT Prophylaxis  Lovenox    Lab Results  Component Value Date   PLT 161 08/10/2014    Medications  Scheduled Meds: . baclofen  10 mg Per Tube TID  . ceFEPime (MAXIPIME) IV  1 g Intravenous Q8H  . docusate  200 mg Oral BID  . enoxaparin (  LOVENOX) injection  40 mg Subcutaneous QHS  . famotidine  20 mg Oral BID  . ferrous sulfate  300 mg Per Tube Daily  . folic acid  1 mg Oral Daily  . hydrocortisone sod succinate (SOLU-CORTEF) inj  50 mg Intravenous 3 times per day  . insulin aspart  0-9 Units Subcutaneous TID WC  . loratadine  10 mg Per Tube Daily  . metoCLOPramide  10 mg Oral TID AC & HS  . multivitamin  5 mL Per Tube Daily  . olopatadine  1 drop Both Eyes q morning - 10a  . pantoprazole sodium  40 mg Per Tube BID  . phenytoin  100 mg Per Tube BID  . polyvinyl  alcohol  1 drop Both Eyes Daily  . saccharomyces boulardii  250 mg Oral BID  . vancomycin  750 mg Intravenous Q8H   Continuous Infusions: . 0.9 % NaCl with KCl 20 mEq / L 75 mL/hr at 08/09/14 1030   PRN Meds:.acetaminophen **OR** acetaminophen, bisacodyl, guaiFENesin-codeine, ondansetron **OR** ondansetron (ZOFRAN) IV  Antibiotics    Anti-infectives    Start     Dose/Rate Route Frequency Ordered Stop   08/09/14 0600  vancomycin (VANCOCIN) IVPB 750 mg/150 ml premix     750 mg 150 mL/hr over 60 Minutes Intravenous Every 8 hours 08/08/14 2316     08/07/14 1000  vancomycin (VANCOCIN) IVPB 750 mg/150 ml premix  Status:  Discontinued     750 mg 150 mL/hr over 60 Minutes Intravenous Every 12 hours 08/07/14 0518 08/08/14 2315   08/07/14 0800  ceFEPIme (MAXIPIME) 1 g in dextrose 5 % 50 mL IVPB     1 g 100 mL/hr over 30 Minutes Intravenous Every 8 hours 08/07/14 0518     08/06/14 2330  vancomycin (VANCOCIN) IVPB 1000 mg/200 mL premix     1,000 mg 200 mL/hr over 60 Minutes Intravenous  Once 08/06/14 2327 08/07/14 0205   08/06/14 2330  ceFEPIme (MAXIPIME) 2 g in dextrose 5 % 50 mL IVPB     2 g 100 mL/hr over 30 Minutes Intravenous  Once 08/06/14 2327 08/07/14 0136   08/06/14 2015  cefTRIAXone (ROCEPHIN) 1 g in dextrose 5 % 50 mL IVPB     1 g 100 mL/hr over 30 Minutes Intravenous  Once 08/06/14 2013 08/06/14 2152        Objective:   Filed Vitals:   08/09/14 0540 08/09/14 1501 08/09/14 2230 08/10/14 0500  BP: 109/62 110/58 109/66 112/68  Pulse: 86 75 71 84  Temp: 98.7 F (37.1 C) 98.1 F (36.7 C) 98.1 F (36.7 C) 98.2 F (36.8 C)  TempSrc: Oral Oral Oral Oral  Resp: 17 16 16 18   Height:      Weight: 67.7 kg (149 lb 4 oz)     SpO2: 97% 96% 97% 96%    Wt Readings from Last 3 Encounters:  08/09/14 67.7 kg (149 lb 4 oz)  03/13/14 68.947 kg (152 lb)  01/17/14 79.3 kg (174 lb 13.2 oz)     Intake/Output Summary (Last 24 hours) at 08/10/14 1003 Last data filed at 08/10/14  0500  Gross per 24 hour  Intake    675 ml  Output   1500 ml  Net   -825 ml     Physical Exam  Awake Alert, Non verbal, quadriplegic at baseline  .AT,PERRAL Supple Neck,No JVD, No cervical lymphadenopathy appriciated.  Symmetrical Chest wall movement, Good air movement bilaterally, coarse R basilar rales RRR,No Gallops,Rubs or new Murmurs, No  Parasternal Heave +ve B.Sounds, Abd Soft, No tenderness, No organomegaly appriciated, No rebound - guarding or rigidity. PEG site stable No Cyanosis, Clubbing or edema, No new Rash or bruise      Data Review   Micro Results Recent Results (from the past 240 hour(s))  Blood Culture (routine x 2)     Status: None (Preliminary result)   Collection Time: 08/06/14  5:00 PM  Result Value Ref Range Status   Specimen Description BLOOD RFARM  Final   Special Requests BOTTLES DRAWN AEROBIC ONLY 3CC  Final   Culture   Final           BLOOD CULTURE RECEIVED NO GROWTH TO DATE CULTURE WILL BE HELD FOR 5 DAYS BEFORE ISSUING A FINAL NEGATIVE REPORT Performed at [facility]    Report Status PENDING  Incomplete  Blood Culture (routine x 2)     Status: None (Preliminary result)   Collection Time: 08/06/14  5:15 PM  Result Value Ref Range Status   Specimen Description BLOOD RAC  Final   Special Requests BOTTLES DRAWN AEROBIC AND ANAEROBIC 5CC  Final   Culture   Final           BLOOD CULTURE RECEIVED NO GROWTH TO DATE CULTURE WILL BE HELD FOR 5 DAYS BEFORE ISSUING A FINAL NEGATIVE REPORT Performed at [facility]    Report Status PENDING  Incomplete  Urine culture     Status: None   Collection Time: 08/06/14  5:30 PM  Result Value Ref Range Status   Specimen Description URINE, CATHETERIZED  Final   Special Requests NONE  Final   Colony Count   Final    >=100,000 COLONIES/ML Performed at [facility]    Culture   Final    ESCHERICHIA COLI PROTEUS MIRABILIS Performed at [facility]    Report Status 08/09/2014  FINAL  Final   Organism ID, Bacteria ESCHERICHIA COLI  Final   Organism ID, Bacteria PROTEUS MIRABILIS  Final      Susceptibility   Escherichia coli - MIC*    AMPICILLIN >=32 RESISTANT Resistant     CEFAZOLIN <=4 SENSITIVE Sensitive     CEFTRIAXONE <=1 SENSITIVE Sensitive     CIPROFLOXACIN >=4 RESISTANT Resistant     GENTAMICIN <=1 SENSITIVE Sensitive     LEVOFLOXACIN >=8 RESISTANT Resistant     NITROFURANTOIN <=16 SENSITIVE Sensitive     TOBRAMYCIN <=1 SENSITIVE Sensitive     TRIMETH/SULFA >=320 RESISTANT Resistant     PIP/TAZO <=4 SENSITIVE Sensitive     * ESCHERICHIA COLI   Proteus mirabilis - MIC*    AMPICILLIN <=2 SENSITIVE Sensitive     CEFAZOLIN 8 SENSITIVE Sensitive     CEFTRIAXONE <=1 SENSITIVE Sensitive     CIPROFLOXACIN >=4 RESISTANT Resistant     GENTAMICIN <=1 SENSITIVE Sensitive     LEVOFLOXACIN >=8 RESISTANT Resistant     NITROFURANTOIN 128 RESISTANT Resistant     TOBRAMYCIN <=1 SENSITIVE Sensitive     TRIMETH/SULFA >=320 RESISTANT Resistant     PIP/TAZO <=4 SENSITIVE Sensitive     * PROTEUS MIRABILIS  MRSA PCR Screening     Status: None   Collection Time: 08/07/14 10:26 AM  Result Value Ref Range Status   MRSA by PCR NEGATIVE NEGATIVE Final    Comment:        The GeneXpert MRSA Assay (FDA approved for NASAL specimens only), is one component of a comprehensive MRSA colonization surveillance program. It is not intended to  diagnose MRSA infection nor to guide or monitor treatment for MRSA infections.     Radiology Reports Ct Abdomen Pelvis W Contrast  08/06/2014   CLINICAL DATA:  Fever, cough, abdominal distention. Unable to tolerate contrast via the PEG. Patient is quadriplegic. Nausea and vomiting.  EXAM: CT ABDOMEN AND PELVIS WITH CONTRAST  TECHNIQUE: Multidetector CT imaging of the abdomen and pelvis was performed using the standard protocol following bolus administration of intravenous contrast.  CONTRAST:  OMNIPAQUE IOHEXOL 300 MG/ML  SOLN   COMPARISON:  CT chest abdomen and pelvis 03/11/2014  FINDINGS: Airspace infiltration in both lung bases, most prominent on the right. This suggests probable pneumonia versus edema. Coronary artery calcifications.  The liver, spleen, gallbladder, pancreas, adrenal glands, abdominal aorta, inferior vena cava, and retroperitoneal lymph nodes are unremarkable. Low-attenuation lesions in the left kidney consistent with cysts. Largest measures 2.4 cm diameter. No hydronephrosis in either kidney. Gastrostomy tube is positioned with balloon in the stomach. Mild infiltration in the subcutaneous fat around the tube insertion site. Stomach, small bowel, and colon are mostly decompressed. Scattered stool in the colon. No free air or free fluid in the abdomen. Abdominal wall musculature appears intact.  Pelvis: Appendix is normal. Stool-filled rectosigmoid colon. Prostate gland is enlarged since prior study with suggestion of low-attenuation and peripheral enhancement demonstrated in the right lobe of the prostate gland. This was not definitely present previously. This could represent prostate lesions such as prostatitis with focal abscess. There is diffuse wall thickening of the bladder with asymmetric prominence in the bladder base. This may indicate cystitis although bladder hypertrophy or neoplasm are not excluded. No free or loculated pelvic fluid collections. No pelvic lymphadenopathy. Endplate compression demonstrated at L5, L3, T12, and T11 levels without change since prior study. No destructive bone lesions are identified.  IMPRESSION: Infiltration in both lung bases, mostly on the right suggesting pneumonia. Diffuse and asymmetric wall thickening of the bladder may indicate cystitis or infiltrating process such as neoplasm. Lesion in the right lobe of the prostate may represent prostatitis with prostatic abscess.   Electronically Signed   By: Burman Nieves M.D.   On: 08/06/2014 23:11   Dg Chest Port 1  View  08/08/2014   CLINICAL DATA:  Shortness of breath  EXAM: PORTABLE CHEST - 1 VIEW  COMPARISON:  08/06/2014  FINDINGS: Pneumonia has developed in the right lung, most pronounced in the right lower lobe. Left lung remains clear. No effusions. No cardiac enlargement. Chronic atherosclerosis of the aorta.  IMPRESSION: Development of pneumonia in the right lung, worst in the right lower lobe.   Electronically Signed   By: Paulina Fusi M.D.   On: 08/08/2014 11:01   Dg Chest Port 1 View  08/06/2014   CLINICAL DATA:  Cough and fever  EXAM: PORTABLE CHEST - 1 VIEW  COMPARISON:  Chest radiograph March 10, 2014; chest CT June 02, 2014.  FINDINGS: There is no edema or consolidation. Heart size and pulmonary vascularity are normal. No adenopathy. No bone lesions.  IMPRESSION: No edema or consolidation.   Electronically Signed   By: Bretta Bang III M.D.   On: 08/06/2014 17:03     CBC  Recent Labs Lab 08/06/14 1719 08/07/14 0545 08/08/14 0455 08/10/14 0530  WBC 8.7 9.6 8.4 3.5*  HGB 11.7* 10.2* 10.6* 9.8*  HCT 36.2* 32.8* 32.6* 30.0*  PLT 223 185 133* 161  MCV 73.7* 74.0* 73.4* 72.1*  MCH 23.8* 23.0* 23.9* 23.6*  MCHC 32.3 31.1 32.5 32.7  RDW 14.5 14.7 14.6 14.4  LYMPHSABS 1.3 0.8  --   --   MONOABS 1.1* 0.7  --   --   EOSABS 0.1 0.0  --   --   BASOSABS 0.0 0.0  --   --     Chemistries   Recent Labs Lab 08/06/14 1719 08/07/14 0545 08/08/14 0455 08/10/14 0530  NA 134* 136 134* 138  K 3.6 3.8 4.0 3.2*  CL 96* 100* 97* 99*  CO2 28 26 26 29   GLUCOSE 90 148* 114* 114*  BUN 20 20 13 7   CREATININE 0.75 0.65 0.58* 0.40*  CALCIUM 8.6* 8.1* 8.1* 8.1*  AST 43* 44*  --  30  ALT 36 37  --  25  ALKPHOS 146* 116  --  85  BILITOT 0.3 0.4  --  0.4   ------------------------------------------------------------------------------------------------------------------ estimated creatinine clearance is 97.6 mL/min (by C-G formula based on Cr of  0.4). ------------------------------------------------------------------------------------------------------------------ No results for input(s): HGBA1C in the last 72 hours. ------------------------------------------------------------------------------------------------------------------ No results for input(s): CHOL, HDL, LDLCALC, TRIG, CHOLHDL, LDLDIRECT in the last 72 hours. ------------------------------------------------------------------------------------------------------------------ No results for input(s): TSH, T4TOTAL, T3FREE, THYROIDAB in the last 72 hours.  Invalid input(s): FREET3 ------------------------------------------------------------------------------------------------------------------ No results for input(s): VITAMINB12, FOLATE, FERRITIN, TIBC, IRON, RETICCTPCT in the last 72 hours.  Coagulation profile  Recent Labs Lab 08/06/14 2330  INR 1.26    No results for input(s): DDIMER in the last 72 hours.  Cardiac Enzymes No results for input(s): CKMB, TROPONINI, MYOGLOBIN in the last 168 hours.  Invalid input(s): CK ------------------------------------------------------------------------------------------------------------------ Invalid input(s): POCBNP   Time Spent in minutes  35   Eliakim Tendler K M.D on 08/10/2014 at 10:03 AM  Between 7am to 7pm - Pager - [phone]  After 7pm go to [email or web address] - password [email or web address]  [facility]   Office  [phone]

## 2014-08-10 NOTE — Progress Notes (Signed)
Subjective: The patient has been AF overnight. For GI reflux test tomorrow at [facility].   Objective: Vital signs in last 24 hours: Temp:  [98.1 F (36.7 C)-98.2 F (36.8 C)] 98.2 F (36.8 C) (05/15 0500) Pulse Rate:  [71-84] 84 (05/15 0500) Resp:  [16-18] 18 (05/15 0500) BP: (109-112)/(58-68) 112/68 mmHg (05/15 0500) SpO2:  [96 %-97 %] 96 % (05/15 0500)A  Intake/Output from previous day: 05/14 0701 - 05/15 0700 In: 675 [I.V.:675] Out: 1500 [Urine:1500] Intake/Output this shift:    Past Medical History  Diagnosis Date  . Quadriplegia   . Depression   . Anemia   . Parkinson's disease   . Seizures   . Adrenal insufficiency   . Dysphagia 06/19/2013  . Aphasia 06/19/2013    Physical Exam:  Lungs - Normal respiratory effort, chest expands symmetrically.  Abdomen - Soft, non-tender & non-distended.  Lab Results:  Recent Labs  08/08/14 0455 08/10/14 0530  WBC 8.4 3.5*  HGB 10.6* 9.8*  HCT 32.6* 30.0*   BMET  Recent Labs  08/08/14 0455 08/10/14 0530  NA 134* 138  K 4.0 3.2*  CL 97* 99*  CO2 26 29  GLUCOSE 114* 114*  BUN 13 7  CREATININE 0.58* 0.40*  CALCIUM 8.1* 8.1*   No results for input(s): LABURIN in the last 72 hours. Results for orders placed or performed during the hospital encounter of 08/06/14  Blood Culture (routine x 2)     Status: None (Preliminary result)   Collection Time: 08/06/14  5:00 PM  Result Value Ref Range Status   Specimen Description BLOOD RFARM  Final   Special Requests BOTTLES DRAWN AEROBIC ONLY 3CC  Final   Culture   Final           BLOOD CULTURE RECEIVED NO GROWTH TO DATE CULTURE WILL BE HELD FOR 5 DAYS BEFORE ISSUING A FINAL NEGATIVE REPORT Performed at [facility]    Report Status PENDING  Incomplete  Blood Culture (routine x 2)     Status: None (Preliminary result)   Collection Time: 08/06/14  5:15 PM  Result Value Ref Range Status   Specimen Description BLOOD RAC  Final   Special Requests BOTTLES DRAWN AEROBIC  AND ANAEROBIC 5CC  Final   Culture   Final           BLOOD CULTURE RECEIVED NO GROWTH TO DATE CULTURE WILL BE HELD FOR 5 DAYS BEFORE ISSUING A FINAL NEGATIVE REPORT Performed at [facility]    Report Status PENDING  Incomplete  Urine culture     Status: None   Collection Time: 08/06/14  5:30 PM  Result Value Ref Range Status   Specimen Description URINE, CATHETERIZED  Final   Special Requests NONE  Final   Colony Count   Final    >=100,000 COLONIES/ML Performed at [facility]    Culture   Final    [place] Performed at [facility]    Report Status 08/09/2014 FINAL  Final   Organism ID, Bacteria ESCHERICHIA COLI  Final   Organism ID, Bacteria PROTEUS MIRABILIS  Final      Susceptibility   Escherichia coli - MIC*    AMPICILLIN >=32 RESISTANT Resistant     CEFAZOLIN <=4 SENSITIVE Sensitive     CEFTRIAXONE <=1 SENSITIVE Sensitive     CIPROFLOXACIN >=4 RESISTANT Resistant     GENTAMICIN <=1 SENSITIVE Sensitive     LEVOFLOXACIN >=8 RESISTANT Resistant     NITROFURANTOIN <=16 SENSITIVE  Sensitive     TOBRAMYCIN <=1 SENSITIVE Sensitive     TRIMETH/SULFA >=320 RESISTANT Resistant     PIP/TAZO <=4 SENSITIVE Sensitive     * ESCHERICHIA COLI   Proteus mirabilis - MIC*    AMPICILLIN <=2 SENSITIVE Sensitive     CEFAZOLIN 8 SENSITIVE Sensitive     CEFTRIAXONE <=1 SENSITIVE Sensitive     CIPROFLOXACIN >=4 RESISTANT Resistant     GENTAMICIN <=1 SENSITIVE Sensitive     LEVOFLOXACIN >=8 RESISTANT Resistant     NITROFURANTOIN 128 RESISTANT Resistant     TOBRAMYCIN <=1 SENSITIVE Sensitive     TRIMETH/SULFA >=320 RESISTANT Resistant     PIP/TAZO <=4 SENSITIVE Sensitive     * PROTEUS MIRABILIS  MRSA PCR Screening     Status: None   Collection Time: 08/07/14 10:26 AM  Result Value Ref Range Status   MRSA by PCR NEGATIVE NEGATIVE Final    Comment:        The GeneXpert MRSA Assay (FDA approved for NASAL specimens only), is one component  of a comprehensive MRSA colonization surveillance program. It is not intended to diagnose MRSA infection nor to guide or monitor treatment for MRSA infections.     Studies/Results:  Assessment:  Fever improved 2ndary antibiotics. Urology continues to follow.  Plan: Follow.  [name] I [name] 08/10/2014, 10:16 AM

## 2014-08-11 ENCOUNTER — Ambulatory Visit (HOSPITAL_COMMUNITY): Admit: 2014-08-11 | Discharge: 2014-08-11 | Disposition: A | Discharge status: Home / Self Care | Payer: Medicare Other

## 2014-08-11 ENCOUNTER — Ambulatory Visit (HOSPITAL_COMMUNITY)
Admit: 2014-08-11 | Discharge: 2014-08-11 | Disposition: A | Discharge status: Home / Self Care | Payer: Medicare Other | Attending: Internal Medicine | Admitting: Internal Medicine

## 2014-08-11 DIAGNOSIS — A419 Sepsis, unspecified organism: Principal | ICD-10-CM

## 2014-08-11 DIAGNOSIS — J690 Pneumonitis due to inhalation of food and vomit: Secondary | ICD-10-CM

## 2014-08-11 DIAGNOSIS — E2740 Unspecified adrenocortical insufficiency: Secondary | ICD-10-CM

## 2014-08-11 DIAGNOSIS — N412 Abscess of prostate: Secondary | ICD-10-CM

## 2014-08-11 LAB — BASIC METABOLIC PANEL
ANION GAP: 10 (ref 5–15)
BUN: 7 mg/dL (ref 6–20)
CALCIUM: 8.2 mg/dL — AB (ref 8.9–10.3)
CO2: 31 mmol/L (ref 22–32)
CREATININE: 0.43 mg/dL — AB (ref 0.61–1.24)
Chloride: 99 mmol/L — ABNORMAL LOW (ref 101–111)
GFR calc Af Amer: >60 mL/min (ref >60)
Glucose, Bld: 87 mg/dL (ref 65–99)
Potassium: 2.7 mmol/L — CL (ref 3.5–5.1)
Sodium: 140 mmol/L (ref 135–145)

## 2014-08-11 LAB — CBC
HCT: 31.2 % — ABNORMAL LOW (ref 39.0–52.0)
HEMOGLOBIN: 9.9 g/dL — AB (ref 13.0–17.0)
MCH: 22.9 pg — ABNORMAL LOW (ref 26.0–34.0)
MCHC: 31.7 g/dL (ref 30.0–36.0)
MCV: 72.2 fL — AB (ref 78.0–100.0)
Platelets: 175 10*3/uL (ref 150–400)
RBC: 4.32 MIL/uL (ref 4.22–5.81)
RDW: 14.2 % (ref 11.5–15.5)
WBC: 7.1 10*3/uL (ref 4.0–10.5)

## 2014-08-11 LAB — GLUCOSE, CAPILLARY
GLUCOSE-CAPILLARY: 98 mg/dL (ref 65–99)
Glucose-Capillary: 92 mg/dL (ref 65–99)
Glucose-Capillary: 88 mg/dL (ref 65–99)

## 2014-08-11 MED ORDER — HYDROCORTISONE NA SUCCINATE PF 100 MG IJ SOLR
25.0000 mg | Freq: Two times a day (BID) | INTRAMUSCULAR | Status: DC
  Administered 2014-08-12: 25 mg via INTRAVENOUS
  Filled 2014-08-11 (×4): qty 0.5

## 2014-08-11 MED ORDER — OSMOLITE 1.5 CAL PO LIQD
1000.0000 mL | ORAL | Status: DC
  Administered 2014-08-11: 1000 mL
  Filled 2014-08-11 (×3): qty 1000

## 2014-08-11 MED ORDER — CEFTRIAXONE SODIUM IN DEXTROSE 40 MG/ML IV SOLN
2.0000 g | INTRAVENOUS | Status: DC
  Administered 2014-08-12: 2 g via INTRAVENOUS
  Filled 2014-08-11 (×3): qty 50

## 2014-08-11 MED ORDER — POTASSIUM CHLORIDE 10 MEQ/100ML IV SOLN
10.0000 meq | INTRAVENOUS | Status: DC
  Administered 2014-08-11 (×4): 10 meq via INTRAVENOUS
  Filled 2014-08-11 (×4): qty 100

## 2014-08-11 NOTE — Progress Notes (Signed)
Nutrition Follow-up  DOCUMENTATION CODES:  Not applicable  INTERVENTION:  -Continue Osmolite 1.5 @ 55 mL/hr with 50 mL free water Q4h; run time of 20 hours with Dilantin BID. This regimen is providing 1650 kcal, 69 grams protein, and 1138 mL free water - RD to continue to monitor for needs  NUTRITION DIAGNOSIS:  Inadequate oral intake related to inability to eat, dysphagia as evidenced by NPO status.  Ongoing.  GOAL:  Patient will meet greater than or equal to 90% of their needs  Unmet.  MONITOR:  TF tolerance, Labs, Weight trends, I & O's   ASSESSMENT: Per H&P, pt is a 59 y.o. male with history of quadriplegia, adrenal insufficiency, seizures, diabetes mellitus type 2, aphasia, dysphagia on PEG tube feeds, chronic anemia. Pt had 2-3 episodes of emesis 5/10 and another episode in the ED yesterday.  Per MD note, pt is scheduled for FEES study at [facility] today.  Per SLP note, pt is to be kept NPO until after FEES.  TF held.   Labs reviewed: Low K, Creatinine CBGs: 92-101  Height:  Ht Readings from Last 1 Encounters:  08/06/14 5\' 8"  (1.727 m)    Weight:  Wt Readings from Last 1 Encounters:  08/09/14 149 lb 4 oz (67.7 kg)    Ideal Body Weight:  70 kg (kg)  Wt Readings from Last 10 Encounters:  08/09/14 149 lb 4 oz (67.7 kg)  03/13/14 152 lb (68.947 kg)  01/17/14 174 lb 13.2 oz (79.3 kg)  01/02/14 155 lb 6.8 oz (70.5 kg)  12/11/13 155 lb 6.4 oz (70.489 kg)  10/30/13 156 lb 3.2 oz (70.852 kg)  07/23/13 156 lb (70.761 kg)  06/24/13 159 lb 9.6 oz (72.394 kg)  05/27/13 158 lb 9.6 oz (71.94 kg)  06/15/12 150 lb 2.1 oz (68.1 kg)    BMI:  Body mass index is 22.7 kg/(m^2).  Estimated Nutritional Needs:  Kcal:  1400-1600  Protein:  60-80 grams  Fluid:  2L/day    Skin:  Reviewed, no issues  Diet Order:     EDUCATION NEEDS:  No education needs identified at this time   Intake/Output Summary (Last 24 hours) at 08/11/14 1149 Last data filed at  08/11/14 1043  Gross per 24 hour  Intake   1630 ml  Output   2600 ml  Net   -970 ml    Last BM:  5/15  Clayton Bibles, MS, RD, LDN Pager: [phone] After Hours Pager: [phone]

## 2014-08-11 NOTE — Progress Notes (Signed)
CSW spoke with Lexine Baton at [facility] to inform her that Pt will most likely be discharged tomorrow after FEES test and will need IV antibiotic treatment. CSW will continue to follow.   Peri Maris, [place] 08/11/2014 4:24 PM [phone]

## 2014-08-11 NOTE — Progress Notes (Addendum)
PATIENT DETAILS Name: [name] Age: 59 y.o. Sex: male Date of Birth: 1956-02-11 Admit Date: 08/06/2014 Admitting Physician Eduard Clos, MD PCP:No primary care provider on file.  Subjective: Denies any chest pain or shortness of breath. Family at bedside.  Assessment/Plan: Principal Problem:   Sepsis: Secondary to aspiration pneumonia and possible prostatic abscess. Sepsis pathophysiology seems to have resolved with IV antibiotics. See below.  Active Problems:   Suspected aspiration pneumonia: Chest x-ray demonstrated right lower lobe infiltrate, given history of quadriplegia and known history of dysphagia-likely aspirating secretions, some suspicion for reflux of Peg feeds. Scheduled for FEES 08/11/14.Clinically improved without any fever or leukocytosis, and blood cultures continue to be negative, will stop vancomycin and cefepime on 5/16 and transition him to Rocephin.     ?Prostatic abscess: Urine culture positive for Escherichia coli and Proteus mirabilis-on IV cefepime and vancomycin since admission. This M.D. spoke with urology-Dr. Marlou Porch on 5/16, recommends at least 4 weeks of antibiotics, urology will arrange for follow-up in the next one month. I spoke with infectious disease-Dr. Daiva Eves over the phone who reviewed chart, recommendations are to complete 4 weeks of IV antibiotics with Rocephin, and repeat a CT scan of the abdomen to see if the abscess has resolved. Will order a PICC line.     Hx of  C Diff Colitis: Unfortunately has had multiple episodes of C. difficile colitis in the past-now with a possible prostatic abscess and requiring prolonged antibiotics. Unfortunately has severe GERD with reflux requiring PPI as well. Very difficult situation, continue with probiotics. Thankfully without any diarrhea so far.       Adrenal insufficiency: On admission because of sepsis placed on stress dose hydrocortisone, taper further and place back on usual  regimen on discharge.    Hypokalemia:replete and recheck    History of seizures: Continue Dilantin.    History of chronic aphagia and spastic quadriparesis: Sustained in 2002 following a acute illness-dictated by viral infection/hyponatremia and hypokalemia. Per family patient was in a "coma".      History of chronic dysphagia: PEG tube in place. Resume PEG feedings-depending on FEES     GERD/?Diabetic Gastroparesis:c/w PPI, Reglan.Sleep with head end elevated     Anemia of chronic disease: Hemoglobin currently close to usual baseline. Follow periodically.  Disposition: Remain inpatient-SNF 5/17. Needs PICC line  Antimicrobial agents  See below  Anti-infectives    Start     Dose/Rate Route Frequency Ordered Stop   08/09/14 0600  vancomycin (VANCOCIN) IVPB 750 mg/150 ml premix     750 mg 150 mL/hr over 60 Minutes Intravenous Every 8 hours 08/08/14 2316     08/07/14 1000  vancomycin (VANCOCIN) IVPB 750 mg/150 ml premix  Status:  Discontinued     750 mg 150 mL/hr over 60 Minutes Intravenous Every 12 hours 08/07/14 0518 08/08/14 2315   08/07/14 0800  ceFEPIme (MAXIPIME) 1 g in dextrose 5 % 50 mL IVPB     1 g 100 mL/hr over 30 Minutes Intravenous Every 8 hours 08/07/14 0518     08/06/14 2330  vancomycin (VANCOCIN) IVPB 1000 mg/200 mL premix     1,000 mg 200 mL/hr over 60 Minutes Intravenous  Once 08/06/14 2327 08/07/14 0205   08/06/14 2330  ceFEPIme (MAXIPIME) 2 g in dextrose 5 % 50 mL IVPB     2 g 100 mL/hr over 30 Minutes Intravenous  Once 08/06/14 2327 08/07/14 0136  08/06/14 2015  cefTRIAXone (ROCEPHIN) 1 g in dextrose 5 % 50 mL IVPB     1 g 100 mL/hr over 30 Minutes Intravenous  Once 08/06/14 2013 08/06/14 2152      DVT Prophylaxis: Prophylactic Lovenox   Code Status: Full code   Family Communication Mother at bedside  Procedures: None  CONSULTS:  urology  Time spent 30 minutes-Greater than 50% of this time was spent in counseling, explanation of  diagnosis, planning of further management, and coordination of care.  MEDICATIONS: Scheduled Meds: . baclofen  10 mg Per Tube TID  . ceFEPime (MAXIPIME) IV  1 g Intravenous Q8H  . docusate  200 mg Oral BID  . enoxaparin (LOVENOX) injection  40 mg Subcutaneous QHS  . famotidine  20 mg Oral BID  . ferrous sulfate  300 mg Per Tube Daily  . folic acid  1 mg Oral Daily  . hydrocortisone sod succinate (SOLU-CORTEF) inj  50 mg Intravenous Q12H  . insulin aspart  0-9 Units Subcutaneous TID WC  . loratadine  10 mg Per Tube Daily  . metoCLOPramide  10 mg Oral TID AC & HS  . multivitamin  5 mL Per Tube Daily  . olopatadine  1 drop Both Eyes q morning - 10a  . pantoprazole sodium  40 mg Per Tube BID  . phenytoin  100 mg Per Tube BID  . polyvinyl alcohol  1 drop Both Eyes Daily  . saccharomyces boulardii  250 mg Oral BID  . vancomycin  750 mg Intravenous Q8H   Continuous Infusions:  PRN Meds:.acetaminophen **OR** acetaminophen, bisacodyl, guaiFENesin-codeine, ondansetron **OR** ondansetron (ZOFRAN) IV    PHYSICAL EXAM: Vital signs in last 24 hours: Filed Vitals:   08/10/14 0500 08/10/14 1520 08/10/14 2100 08/11/14 0556  BP: 112/68 110/68 121/58 124/61  Pulse: 84 86  58  Temp: 98.2 F (36.8 C) 98.6 F (37 C) 97.4 F (36.3 C) 98.1 F (36.7 C)  TempSrc: Oral Oral Oral Oral  Resp: 18 18 20 22   Height:      Weight:      SpO2: 96% 98% 97% 95%    Weight change:  Filed Weights   08/06/14 2327 08/08/14 0544 08/09/14 0540  Weight: 66.1 kg (145 lb 11.6 oz) 68.1 kg (150 lb 2.1 oz) 67.7 kg (149 lb 4 oz)   Body mass index is 22.7 kg/(m^2).   Awake Alert, Non verbal, quadriplegic at baseline  [place].AT,PERRAL Supple Neck,No JVD, No cervical lymphadenopathy appriciated.  Symmetrical Chest wall movement, Good air movement bilaterally, coarse R basilar rales RRR,No Gallops,Rubs or new Murmurs, No Parasternal Heave +ve B.Sounds, Abd Soft, No tenderness, No organomegaly appriciated, No rebound  - guarding or rigidity. PEG site stable No Cyanosis, Clubbing or edema, No new Rash or bruise   Intake/Output from previous day:  Intake/Output Summary (Last 24 hours) at 08/11/14 1358 Last data filed at 08/11/14 1043  Gross per 24 hour  Intake   1630 ml  Output   2600 ml  Net   -970 ml     LAB RESULTS: CBC  Recent Labs Lab 08/06/14 1719 08/07/14 0545 08/08/14 0455 08/10/14 0530 08/11/14 0545  WBC 8.7 9.6 8.4 3.5* 7.1  HGB 11.7* 10.2* 10.6* 9.8* 9.9*  HCT 36.2* 32.8* 32.6* 30.0* 31.2*  PLT 223 185 133* 161 175  MCV 73.7* 74.0* 73.4* 72.1* 72.2*  MCH 23.8* 23.0* 23.9* 23.6* 22.9*  MCHC 32.3 31.1 32.5 32.7 31.7  RDW 14.5 14.7 14.6 14.4 14.2  LYMPHSABS 1.3 0.8  --   --   --  MONOABS 1.1* 0.7  --   --   --   EOSABS 0.1 0.0  --   --   --   BASOSABS 0.0 0.0  --   --   --     Chemistries   Recent Labs Lab 08/06/14 1719 08/07/14 0545 08/08/14 0455 08/10/14 0530 08/11/14 0545  NA 134* 136 134* 138 140  K 3.6 3.8 4.0 3.2* 2.7*  CL 96* 100* 97* 99* 99*  CO2 28 26 26 29 31   GLUCOSE 90 148* 114* 114* 87  BUN 20 20 13 7 7   CREATININE 0.75 0.65 0.58* 0.40* 0.43*  CALCIUM 8.6* 8.1* 8.1* 8.1* 8.2*    CBG:  Recent Labs Lab 08/10/14 1133 08/10/14 1651 08/10/14 2130 08/11/14 0734 08/11/14 1126  GLUCAP 117* 99 101* 98 92    GFR Estimated Creatinine Clearance: 97.6 mL/min (by C-G formula based on Cr of 0.43).  Coagulation profile  Recent Labs Lab 08/06/14 2330  INR 1.26    Cardiac Enzymes No results for input(s): CKMB, TROPONINI, MYOGLOBIN in the last 168 hours.  Invalid input(s): CK  Invalid input(s): POCBNP No results for input(s): DDIMER in the last 72 hours. No results for input(s): HGBA1C in the last 72 hours. No results for input(s): CHOL, HDL, LDLCALC, TRIG, CHOLHDL, LDLDIRECT in the last 72 hours. No results for input(s): TSH, T4TOTAL, T3FREE, THYROIDAB in the last 72 hours.  Invalid input(s): FREET3 No results for input(s): VITAMINB12,  FOLATE, FERRITIN, TIBC, IRON, RETICCTPCT in the last 72 hours. No results for input(s): LIPASE, AMYLASE in the last 72 hours.  Urine Studies No results for input(s): UHGB, CRYS in the last 72 hours.  Invalid input(s): UACOL, UAPR, USPG, UPH, UTP, UGL, UKET, UBIL, UNIT, UROB, ULEU, UEPI, UWBC, URBC, UBAC, CAST, UCOM, BILUA  MICROBIOLOGY: Recent Results (from the past 240 hour(s))  Blood Culture (routine x 2)     Status: None (Preliminary result)   Collection Time: 08/06/14  5:00 PM  Result Value Ref Range Status   Specimen Description BLOOD RFARM  Final   Special Requests BOTTLES DRAWN AEROBIC ONLY 3CC  Final   Culture   Final           BLOOD CULTURE RECEIVED NO GROWTH TO DATE CULTURE WILL BE HELD FOR 5 DAYS BEFORE ISSUING A FINAL NEGATIVE REPORT Performed at [facility]    Report Status PENDING  Incomplete  Blood Culture (routine x 2)     Status: None (Preliminary result)   Collection Time: 08/06/14  5:15 PM  Result Value Ref Range Status   Specimen Description BLOOD RAC  Final   Special Requests BOTTLES DRAWN AEROBIC AND ANAEROBIC 5CC  Final   Culture   Final           BLOOD CULTURE RECEIVED NO GROWTH TO DATE CULTURE WILL BE HELD FOR 5 DAYS BEFORE ISSUING A FINAL NEGATIVE REPORT Performed at [facility]    Report Status PENDING  Incomplete  Urine culture     Status: None   Collection Time: 08/06/14  5:30 PM  Result Value Ref Range Status   Specimen Description URINE, CATHETERIZED  Final   Special Requests NONE  Final   Colony Count   Final    >=100,000 COLONIES/ML Performed at [facility]    Culture   Final    ESCHERICHIA COLI PROTEUS MIRABILIS Performed at [facility]    Report Status 08/09/2014 FINAL  Final   Organism ID, Bacteria ESCHERICHIA COLI  Final  Organism ID, Bacteria PROTEUS MIRABILIS  Final      Susceptibility   Escherichia coli - MIC*    AMPICILLIN >=32 RESISTANT Resistant     CEFAZOLIN <=4 SENSITIVE Sensitive      CEFTRIAXONE <=1 SENSITIVE Sensitive     CIPROFLOXACIN >=4 RESISTANT Resistant     GENTAMICIN <=1 SENSITIVE Sensitive     LEVOFLOXACIN >=8 RESISTANT Resistant     NITROFURANTOIN <=16 SENSITIVE Sensitive     TOBRAMYCIN <=1 SENSITIVE Sensitive     TRIMETH/SULFA >=320 RESISTANT Resistant     PIP/TAZO <=4 SENSITIVE Sensitive     * ESCHERICHIA COLI   Proteus mirabilis - MIC*    AMPICILLIN <=2 SENSITIVE Sensitive     CEFAZOLIN 8 SENSITIVE Sensitive     CEFTRIAXONE <=1 SENSITIVE Sensitive     CIPROFLOXACIN >=4 RESISTANT Resistant     GENTAMICIN <=1 SENSITIVE Sensitive     LEVOFLOXACIN >=8 RESISTANT Resistant     NITROFURANTOIN 128 RESISTANT Resistant     TOBRAMYCIN <=1 SENSITIVE Sensitive     TRIMETH/SULFA >=320 RESISTANT Resistant     PIP/TAZO <=4 SENSITIVE Sensitive     * PROTEUS MIRABILIS  MRSA PCR Screening     Status: None   Collection Time: 08/07/14 10:26 AM  Result Value Ref Range Status   MRSA by PCR NEGATIVE NEGATIVE Final    Comment:        The GeneXpert MRSA Assay (FDA approved for NASAL specimens only), is one component of a comprehensive MRSA colonization surveillance program. It is not intended to diagnose MRSA infection nor to guide or monitor treatment for MRSA infections.     RADIOLOGY STUDIES/RESULTS: Ct Abdomen Pelvis W Contrast  08/06/2014   CLINICAL DATA:  Fever, cough, abdominal distention. Unable to tolerate contrast via the PEG. Patient is quadriplegic. Nausea and vomiting.  EXAM: CT ABDOMEN AND PELVIS WITH CONTRAST  TECHNIQUE: Multidetector CT imaging of the abdomen and pelvis was performed using the standard protocol following bolus administration of intravenous contrast.  CONTRAST:  OMNIPAQUE IOHEXOL 300 MG/ML  SOLN  COMPARISON:  CT chest abdomen and pelvis 03/11/2014  FINDINGS: Airspace infiltration in both lung bases, most prominent on the right. This suggests probable pneumonia versus edema. Coronary artery calcifications.  The liver, spleen,  gallbladder, pancreas, adrenal glands, abdominal aorta, inferior vena cava, and retroperitoneal lymph nodes are unremarkable. Low-attenuation lesions in the left kidney consistent with cysts. Largest measures 2.4 cm diameter. No hydronephrosis in either kidney. Gastrostomy tube is positioned with balloon in the stomach. Mild infiltration in the subcutaneous fat around the tube insertion site. Stomach, small bowel, and colon are mostly decompressed. Scattered stool in the colon. No free air or free fluid in the abdomen. Abdominal wall musculature appears intact.  Pelvis: Appendix is normal. Stool-filled rectosigmoid colon. Prostate gland is enlarged since prior study with suggestion of low-attenuation and peripheral enhancement demonstrated in the right lobe of the prostate gland. This was not definitely present previously. This could represent prostate lesions such as prostatitis with focal abscess. There is diffuse wall thickening of the bladder with asymmetric prominence in the bladder base. This may indicate cystitis although bladder hypertrophy or neoplasm are not excluded. No free or loculated pelvic fluid collections. No pelvic lymphadenopathy. Endplate compression demonstrated at L5, L3, T12, and T11 levels without change since prior study. No destructive bone lesions are identified.  IMPRESSION: Infiltration in both lung bases, mostly on the right suggesting pneumonia. Diffuse and asymmetric wall thickening of the bladder may indicate cystitis or  infiltrating process such as neoplasm. Lesion in the right lobe of the prostate may represent prostatitis with prostatic abscess.   Electronically Signed   By: Burman Nieves M.D.   On: 08/06/2014 23:11   Dg Chest Port 1 View  08/08/2014   CLINICAL DATA:  Shortness of breath  EXAM: PORTABLE CHEST - 1 VIEW  COMPARISON:  08/06/2014  FINDINGS: Pneumonia has developed in the right lung, most pronounced in the right lower lobe. Left lung remains clear. No effusions.  No cardiac enlargement. Chronic atherosclerosis of the aorta.  IMPRESSION: Development of pneumonia in the right lung, worst in the right lower lobe.   Electronically Signed   By: Paulina Fusi M.D.   On: 08/08/2014 11:01   Dg Chest Port 1 View  08/06/2014   CLINICAL DATA:  Cough and fever  EXAM: PORTABLE CHEST - 1 VIEW  COMPARISON:  Chest radiograph March 10, 2014; chest CT June 02, 2014.  FINDINGS: There is no edema or consolidation. Heart size and pulmonary vascularity are normal. No adenopathy. No bone lesions.  IMPRESSION: No edema or consolidation.   Electronically Signed   By: Bretta Bang III M.D.   On: 08/06/2014 17:03    Jeoffrey Massed, MD  [facility] Pager:[phone]  If 7PM-7AM, please contact night-coverage [email or web address] Password [email or web address] 08/11/2014, 1:58 PM   LOS: 5 days

## 2014-08-11 NOTE — Procedures (Signed)
Objective Swallowing Evaluation: Other (Comment) (FEES)  Patient Details  Name: [name] MRN: [id number] Date of Birth: [birth year]  Today's Date: 08/11/2014 Time: SLP Start Time (ACUTE ONLY): 1320-SLP Stop Time (ACUTE ONLY): 1348 SLP Time Calculation (min) (ACUTE ONLY): 28 min  Past Medical History:  Past Medical History  Diagnosis Date  . Quadriplegia   . Depression   . Anemia   . Parkinson's disease   . Seizures   . Adrenal insufficiency   . Dysphagia 06/19/2013  . Aphasia 06/19/2013   Past Surgical History:  Past Surgical History  Procedure Laterality Date  . Peg tube placement    . Peripherally inserted central catheter insertion    . Tonsillectomy  1962   HPI: Pt is a 59 y.o. male with history of quadriplegia, adrenal insufficiency, seizures, diabetes mellitus type 2, dysarthria, dysphagia on PEG tube feeds, chronic anemia, and Parkinson's disease. Per family, pt was receiving primary nutrition/medications via PEG, however was drinking thin liquids as desired. Pt/family are not aware of any previous objective testing to assess swallowing function. Swallow evaluation was ordered to assess oropharyngeal function with concern for aspiration of secretions versus tube feeds.   Assessment / Plan / Recommendation CHL IP CLINICAL IMPRESSIONS 08/11/2014  Therapy Diagnosis Mild oral phase dysphagia;Severe pharyngeal phase dysphagia   Clinical Impression Pt has a mild oral and severe pharyngeal dysphagia with standing secretions throughout pharynx upon initial entry. The epiglottis projects back to the posterior pharyngeal wall, making visualization of the vocal folds difficult, although there appears to be asymmetry of the arytenoids. The vallecular space is widened and with bumpy, uneven surface. All consistencies tested were spilled prematurely beyond the valleculae with diffuse residuals post-swallow. Although aspiration could not be visualized due to anatomy, delayed coughing was  productive for green-tinged secretions indicative of aspiration with delayed sensation. Recommend that pt continue to remain NPO with all nutrition, hydration, and medications via alternative means. Pt and mother were educated regarding the results and verbalized their understanding. SLP to sign off at this time.      CHL IP TREATMENT RECOMMENDATION 08/11/2014  Treatment Recommendations No treatment recommended at this time     CHL IP DIET RECOMMENDATION 08/11/2014  SLP Diet Recommendations NPO;Alternative means - long-term  Medication Administration Via alternative means     CHL IP OTHER RECOMMENDATIONS 08/11/2014  Oral Care Recommendations Oral care QID           Pertinent Vitals/Pain: n/a     SLP Swallow Goals     CHL IP REASON FOR REFERRAL 08/11/2014  Reason for Referral Objectively evaluate swallowing function     CHL IP ORAL PHASE 08/11/2014  Oral Phase Impaired      CHL IP PHARYNGEAL PHASE 08/11/2014  Pharyngeal Phase Impaired           Maxcine Ham, M.A. CCC-SLP [phone]  Maxcine Ham 08/11/2014, 2:40 PM

## 2014-08-12 LAB — BASIC METABOLIC PANEL
Anion gap: 10 (ref 5–15)
Anion gap: 11 (ref 5–15)
BUN: 8 mg/dL (ref 6–20)
BUN: 7 mg/dL (ref 6–20)
CALCIUM: 8.4 mg/dL — AB (ref 8.9–10.3)
CALCIUM: 8.2 mg/dL — AB (ref 8.9–10.3)
CO2: 33 mmol/L — ABNORMAL HIGH (ref 22–32)
CO2: 32 mmol/L (ref 22–32)
CREATININE: 0.51 mg/dL — AB (ref 0.61–1.24)
CREATININE: 0.47 mg/dL — AB (ref 0.61–1.24)
Chloride: 94 mmol/L — ABNORMAL LOW (ref 101–111)
Chloride: 96 mmol/L — ABNORMAL LOW (ref 101–111)
GFR calc Af Amer: >60 mL/min (ref >60)
GLUCOSE: 101 mg/dL — AB (ref 65–99)
GLUCOSE: 103 mg/dL — AB (ref 65–99)
Potassium: 2.5 mmol/L — CL (ref 3.5–5.1)
Potassium: 3.3 mmol/L — ABNORMAL LOW (ref 3.5–5.1)
Sodium: 137 mmol/L (ref 135–145)
Sodium: 139 mmol/L (ref 135–145)

## 2014-08-12 LAB — CULTURE, BLOOD (ROUTINE X 2)
Culture: NO GROWTH
Culture: NO GROWTH

## 2014-08-12 LAB — MAGNESIUM: MAGNESIUM: 2 mg/dL (ref 1.7–2.4)

## 2014-08-12 LAB — GLUCOSE, CAPILLARY
GLUCOSE-CAPILLARY: 99 mg/dL (ref 65–99)
Glucose-Capillary: 77 mg/dL (ref 65–99)
Glucose-Capillary: 107 mg/dL — ABNORMAL HIGH (ref 65–99)
Glucose-Capillary: 90 mg/dL (ref 65–99)
Glucose-Capillary: 99 mg/dL (ref 65–99)

## 2014-08-12 MED ORDER — POTASSIUM CHLORIDE 20 MEQ/15ML (10%) PO SOLN
40.0000 meq | Freq: Once | ORAL | Status: AC
  Administered 2014-08-12: 40 meq
  Filled 2014-08-12: qty 30

## 2014-08-12 MED ORDER — BACLOFEN 10 MG PO TABS: 10.0000 mg | ORAL_TABLET | Freq: Three times a day (TID) | ORAL | Status: DC

## 2014-08-12 MED ORDER — OSMOLITE 1.5 CAL PO LIQD
1000.0000 mL | ORAL | Status: DC
  Administered 2014-08-12 (×2): 1000 mL
  Filled 2014-08-12 (×2): qty 1000

## 2014-08-12 MED ORDER — HYDROCORTISONE 5 MG/ML ORAL SUSPENSION: 15.0000 mg | Freq: Every evening | ORAL | Status: DC

## 2014-08-12 MED ORDER — HYDROCORTISONE 5 MG/ML ORAL SUSPENSION
25.0000 mg | Freq: Every day | ORAL | Status: DC
Start: 2014-08-12 — End: 2014-08-12
  Filled 2014-08-12 (×2): qty 5

## 2014-08-12 MED ORDER — HYDROCORTISONE 5 MG PO TABS
25.0000 mg | ORAL_TABLET | ORAL | Status: DC
  Administered 2014-08-13: 25 mg
  Filled 2014-08-12 (×2): qty 1

## 2014-08-12 MED ORDER — METOCLOPRAMIDE HCL 10 MG PO TABS: 10.0000 mg | ORAL_TABLET | Freq: Three times a day (TID) | ORAL | Status: DC

## 2014-08-12 MED ORDER — POTASSIUM CHLORIDE 10 MEQ/100ML IV SOLN
10.0000 meq | INTRAVENOUS | Status: DC
  Administered 2014-08-12 (×2): 10 meq via INTRAVENOUS
  Filled 2014-08-12 (×2): qty 100

## 2014-08-12 MED ORDER — CEFTRIAXONE SODIUM IN DEXTROSE 40 MG/ML IV SOLN: 2.0000 g | INTRAVENOUS | Status: DC

## 2014-08-12 MED ORDER — POTASSIUM CHLORIDE 20 MEQ PO PACK: 40.0000 meq | PACK | Freq: Once | ORAL | Status: DC

## 2014-08-12 MED ORDER — FAMOTIDINE 20 MG PO TABS: 20.0000 mg | ORAL_TABLET | Freq: Two times a day (BID) | ORAL | Status: DC

## 2014-08-12 MED ORDER — POLYETHYLENE GLYCOL 3350 17 G PO PACK: 17.0000 g | PACK | Freq: Every day | ORAL | Status: DC

## 2014-08-12 MED ORDER — ACETAMINOPHEN 325 MG PO TABS
650.0000 mg | ORAL_TABLET | Freq: Four times a day (QID) | ORAL | Status: DC | PRN
Start: 2014-08-12 — End: 2016-01-15

## 2014-08-12 MED ORDER — HYDROCORTISONE 10 MG PO TABS
15.0000 mg | ORAL_TABLET | Freq: Every day | ORAL | Status: DC
  Administered 2014-08-12: 15 mg
  Filled 2014-08-12 (×3): qty 1

## 2014-08-12 MED ORDER — POTASSIUM CHLORIDE 20 MEQ/15ML (10%) PO SOLN
30.0000 meq | ORAL | Status: DC
Start: 2014-08-12 — End: 2014-08-12
  Filled 2014-08-12 (×2): qty 22.5

## 2014-08-12 MED ORDER — OSMOLITE 1.5 CAL PO LIQD: 1000.0000 mL | ORAL | Status: DC

## 2014-08-12 MED ORDER — HYDROCORTISONE 5 MG/ML ORAL SUSPENSION
15.0000 mg | Freq: Every evening | ORAL | Status: DC
  Filled 2014-08-12: qty 3

## 2014-08-12 NOTE — Progress Notes (Signed)
Have called all four telephone numbers given on the chart to get consent for PICC line. There has been no answer. Left a message call us back

## 2014-08-12 NOTE — Care Management Note (Signed)
Case Management Note  Patient Details  Name: [name] MRN: [id number] Date of Birth: 1956/03/17  Subjective/Objective:     59 yo male admitted with sepsis               Action/Plan: Discharge planning  Expected Discharge Date:   (unknown)               Expected Discharge Plan:  Skilled Nursing Facility  In-House Referral:  Clinical Social Work  Discharge planning Services  CM Consult  Post Acute Care Choice:  NA Choice offered to:  NA  DME Arranged:    DME Agency:     HH Arranged:    [place] Agency:     Status of Service:  Completed, signed off  Medicare Important Message Given:  Yes Date Medicare IM Given:  08/12/14 Medicare IM give by:  Leanne Chang Date Additional Medicare IM Given:    Additional Medicare Important Message give by:     If discussed at [place] of Stay Meetings, dates discussed:    Additional Comments:  Scot Dock, RN 08/12/2014, 3:16 PM

## 2014-08-12 NOTE — Discharge Summary (Signed)
PATIENT DETAILS Name: [name] Age: 59 y.o. Sex: male Date of Birth: 06-Apr-1955 MRN: [id number]. Admitting Physician: Eduard Clos, MD PCP:No primary care provider on file.  Admit Date: 08/06/2014 Discharge date: 08/12/2014  Recommendations for Outpatient Follow-up:  1. Please check BMET in 2 days to ensure potassium within normal limits 2. Needs weekly CBC and CMET while on IV Rocephin 3. Needs IV Rocephin for 4 weeks from 08/07/14 4. Ensure follow-up with urology once patient has completed IV antibiotics. 5. Keep nothing by mouth-high-risk for aspiration. On PEG feeds. 6. Please remove PICC line once patient has completed IV antibiotics. 7. Keep head end of the bed elevated at all times  PRIMARY DISCHARGE DIAGNOSIS:  Principal Problem:   Sepsis Active Problems:   Adrenal insufficiency   Seizures   PEG (percutaneous endoscopic gastrostomy) status   Pyelonephritis      PAST MEDICAL HISTORY: Past Medical History  Diagnosis Date  . Quadriplegia   . Depression   . Anemia   . Parkinson's disease   . Seizures   . Adrenal insufficiency   . Dysphagia 06/19/2013  . Aphasia 06/19/2013    DISCHARGE MEDICATIONS: Current Discharge Medication List    START taking these medications   Details  cefTRIAXone (ROCEPHIN) 40 MG/ML IVPB Inject 50 mLs (2 g total) into the vein daily. For 4 weeks from 08/07/14. Qty: 50 mL    polyethylene glycol (MIRALAX / GLYCOLAX) packet Place 17 g into feeding tube daily. Qty: 14 each, Refills: 0      CONTINUE these medications which have CHANGED   Details  acetaminophen (TYLENOL) 325 MG tablet Place 2 tablets (650 mg total) into feeding tube every 6 (six) hours as needed for fever (Give 2 tablets via g-tube every 6 hours as needed for a fever greater than 101 degrees F).    baclofen (LIORESAL) 10 MG tablet Place 1 tablet (10 mg total) into feeding tube 3 (three) times daily. Qty: 30 each, Refills: 0    famotidine (PEPCID) 20 MG  tablet Place 1 tablet (20 mg total) into feeding tube 2 (two) times daily. Give 1 tablet via peg tube twice daily    !! hydrocortisone (CORTEF) 5 mg/mL SUSP Place 3 mLs (15 mg total) into feeding tube every evening.    metoCLOPramide (REGLAN) 10 MG tablet Place 1 tablet (10 mg total) into feeding tube 4 (four) times daily -  before meals and at bedtime. Via Peg tube    Nutritional Supplements (FEEDING SUPPLEMENT, OSMOLITE 1.5 CAL,) LIQD Place 1,000 mLs into feeding tube daily. At 55 cc/hr Refills: 0     !! - Potential duplicate medications found. Please discuss with provider.    CONTINUE these medications which have NOT CHANGED   Details  bisacodyl (DULCOLAX) 10 MG suppository Place 10 mg rectally daily as needed for moderate constipation. If constipation not relieved by milk of magnesia give one 10 mg suppository in 24hours    ferrous sulfate 220 (44 FE) MG/5ML solution Place 330 mg into feeding tube See admin instructions. Give 7.62ml  Per tube every morning    folic acid (FOLVITE) 1 MG tablet 1 mg by PEG Tube route daily.    !! hydrocortisone (CORTEF) 5 mg/mL SUSP Place 5 mLs (25 mg total) into feeding tube daily at 6 (six) AM.    loratadine (CLARITIN) 10 MG tablet 10 mg by PEG Tube route daily.    Multiple Vitamins-Minerals (CENTAMIN) LIQD 5 mLs by PEG Tube route daily.     olopatadine (  PATANOL) 0.1 % ophthalmic solution Place 1 drop into both eyes every morning. Qty: 5 mL, Refills: 12    phenytoin (DILANTIN) 125 MG/5ML suspension Place 4 mLs (100 mg total) into feeding tube 2 (two) times daily. Qty: 237 mL, Refills: 12    polyvinyl alcohol (LIQUIFILM TEARS) 1.4 % ophthalmic solution Place 1 drop into both eyes daily. Qty: 15 mL, Refills: 0    saccharomyces boulardii (FLORASTOR) 250 MG capsule 250 mg by PEG Tube route.     Water For Irrigation, Sterile (FREE WATER) SOLN Place 250 mLs into feeding tube every 4 (four) hours.     !! - Potential duplicate medications found.  Please discuss with provider.    STOP taking these medications     gentamicin-prednisoLONE 0.3-1 % ophthalmic drops      PRESCRIPTION MEDICATION      PRESCRIPTION MEDICATION      PRESCRIPTION MEDICATION      Amino Acids-Protein Hydrolys (FEEDING SUPPLEMENT, PRO-STAT SUGAR FREE 64,) LIQD      famotidine (PEPCID) 40 MG/5ML suspension      vancomycin (VANCOCIN) 50 mg/mL oral solution         ALLERGIES:   Allergies  Allergen Reactions  . Aspirin Other (See Comments)    Unknown reaction; "blood doesn't clog too well" per mother.   . Nsaids Other (See Comments)    Unknown reaction  . Penicillins Hives and Other (See Comments)    Unknown reaction.  Pt has tolerated cephalosporins in the past.    BRIEF HPI:  See H&P, Labs, Consult and Test reports for all details in brief, patient is a 59 y.o. male with history of quadriplegia, adrenal insufficiency, seizures, diabetes mellitus type 2, aphasia, dysphagia on PEG tubes, chronic anemia was brought to the ER after patient's mother noticed that patient was having fever with nausea and vomiting at the nursing facility  CONSULTATIONS:   urology  PERTINENT RADIOLOGIC STUDIES: Ct Abdomen Pelvis W Contrast  08/06/2014   CLINICAL DATA:  Fever, cough, abdominal distention. Unable to tolerate contrast via the PEG. Patient is quadriplegic. Nausea and vomiting.  EXAM: CT ABDOMEN AND PELVIS WITH CONTRAST  TECHNIQUE: Multidetector CT imaging of the abdomen and pelvis was performed using the standard protocol following bolus administration of intravenous contrast.  CONTRAST:  OMNIPAQUE IOHEXOL 300 MG/ML  SOLN  COMPARISON:  CT chest abdomen and pelvis 03/11/2014  FINDINGS: Airspace infiltration in both lung bases, most prominent on the right. This suggests probable pneumonia versus edema. Coronary artery calcifications.  The liver, spleen, gallbladder, pancreas, adrenal glands, abdominal aorta, inferior vena cava, and retroperitoneal lymph nodes  are unremarkable. Low-attenuation lesions in the left kidney consistent with cysts. Largest measures 2.4 cm diameter. No hydronephrosis in either kidney. Gastrostomy tube is positioned with balloon in the stomach. Mild infiltration in the subcutaneous fat around the tube insertion site. Stomach, small bowel, and colon are mostly decompressed. Scattered stool in the colon. No free air or free fluid in the abdomen. Abdominal wall musculature appears intact.  Pelvis: Appendix is normal. Stool-filled rectosigmoid colon. Prostate gland is enlarged since prior study with suggestion of low-attenuation and peripheral enhancement demonstrated in the right lobe of the prostate gland. This was not definitely present previously. This could represent prostate lesions such as prostatitis with focal abscess. There is diffuse wall thickening of the bladder with asymmetric prominence in the bladder base. This may indicate cystitis although bladder hypertrophy or neoplasm are not excluded. No free or loculated pelvic fluid collections. No pelvic  lymphadenopathy. Endplate compression demonstrated at L5, L3, T12, and T11 levels without change since prior study. No destructive bone lesions are identified.  IMPRESSION: Infiltration in both lung bases, mostly on the right suggesting pneumonia. Diffuse and asymmetric wall thickening of the bladder may indicate cystitis or infiltrating process such as neoplasm. Lesion in the right lobe of the prostate may represent prostatitis with prostatic abscess.   Electronically Signed   By: Burman Nieves M.D.   On: 08/06/2014 23:11   Dg Chest Port 1 View  08/08/2014   CLINICAL DATA:  Shortness of breath  EXAM: PORTABLE CHEST - 1 VIEW  COMPARISON:  08/06/2014  FINDINGS: Pneumonia has developed in the right lung, most pronounced in the right lower lobe. Left lung remains clear. No effusions. No cardiac enlargement. Chronic atherosclerosis of the aorta.  IMPRESSION: Development of pneumonia in the  right lung, worst in the right lower lobe.   Electronically Signed   By: Paulina Fusi M.D.   On: 08/08/2014 11:01   Dg Chest Port 1 View  08/06/2014   CLINICAL DATA:  Cough and fever  EXAM: PORTABLE CHEST - 1 VIEW  COMPARISON:  Chest radiograph March 10, 2014; chest CT June 02, 2014.  FINDINGS: There is no edema or consolidation. Heart size and pulmonary vascularity are normal. No adenopathy. No bone lesions.  IMPRESSION: No edema or consolidation.   Electronically Signed   By: Bretta Bang III M.D.   On: 08/06/2014 17:03     PERTINENT LAB RESULTS: CBC:  Recent Labs  08/10/14 0530 08/11/14 0545  WBC 3.5* 7.1  HGB 9.8* 9.9*  HCT 30.0* 31.2*  PLT 161 175   CMET CMP     Component Value Date/Time   NA 139 08/12/2014 1150   NA 134* 09/24/2013   K 3.3* 08/12/2014 1150   CL 96* 08/12/2014 1150   CO2 32 08/12/2014 1150   GLUCOSE 103* 08/12/2014 1150   BUN 7 08/12/2014 1150   BUN 18 09/24/2013   CREATININE 0.47* 08/12/2014 1150   CREATININE 0.5* 09/24/2013   CALCIUM 8.2* 08/12/2014 1150   PROT 7.0 08/10/2014 0530   ALBUMIN 2.6* 08/10/2014 0530   AST 30 08/10/2014 0530   ALT 25 08/10/2014 0530   ALKPHOS 85 08/10/2014 0530   BILITOT 0.4 08/10/2014 0530   GFRNONAA >60 08/12/2014 1150   GFRAA >60 08/12/2014 1150    GFR Estimated Creatinine Clearance: 97.6 mL/min (by C-G formula based on Cr of 0.47). No results for input(s): LIPASE, AMYLASE in the last 72 hours. No results for input(s): CKTOTAL, CKMB, CKMBINDEX, TROPONINI in the last 72 hours. Invalid input(s): POCBNP No results for input(s): DDIMER in the last 72 hours. No results for input(s): HGBA1C in the last 72 hours. No results for input(s): CHOL, HDL, LDLCALC, TRIG, CHOLHDL, LDLDIRECT in the last 72 hours. No results for input(s): TSH, T4TOTAL, T3FREE, THYROIDAB in the last 72 hours.  Invalid input(s): FREET3 No results for input(s): VITAMINB12, FOLATE, FERRITIN, TIBC, IRON, RETICCTPCT in the last 72  hours. Coags: No results for input(s): INR in the last 72 hours.  Invalid input(s): PT Microbiology: Recent Results (from the past 240 hour(s))  Blood Culture (routine x 2)     Status: None   Collection Time: 08/06/14  5:00 PM  Result Value Ref Range Status   Specimen Description BLOOD RFARM  Final   Special Requests BOTTLES DRAWN AEROBIC ONLY 3CC  Final   Culture   Final    NO GROWTH 5 DAYS Performed  at [facility]    Report Status 08/12/2014 FINAL  Final  Blood Culture (routine x 2)     Status: None   Collection Time: 08/06/14  5:15 PM  Result Value Ref Range Status   Specimen Description BLOOD RAC  Final   Special Requests BOTTLES DRAWN AEROBIC AND ANAEROBIC 5CC  Final   Culture   Final    NO GROWTH 5 DAYS Performed at [facility]    Report Status 08/12/2014 FINAL  Final  Urine culture     Status: None   Collection Time: 08/06/14  5:30 PM  Result Value Ref Range Status   Specimen Description URINE, CATHETERIZED  Final   Special Requests NONE  Final   Colony Count   Final    >=100,000 COLONIES/ML Performed at [facility]    Culture   Final    ESCHERICHIA COLI PROTEUS MIRABILIS Performed at [facility]    Report Status 08/09/2014 FINAL  Final   Organism ID, Bacteria ESCHERICHIA COLI  Final   Organism ID, Bacteria PROTEUS MIRABILIS  Final      Susceptibility   Escherichia coli - MIC*    AMPICILLIN >=32 RESISTANT Resistant     CEFAZOLIN <=4 SENSITIVE Sensitive     CEFTRIAXONE <=1 SENSITIVE Sensitive     CIPROFLOXACIN >=4 RESISTANT Resistant     GENTAMICIN <=1 SENSITIVE Sensitive     LEVOFLOXACIN >=8 RESISTANT Resistant     NITROFURANTOIN <=16 SENSITIVE Sensitive     TOBRAMYCIN <=1 SENSITIVE Sensitive     TRIMETH/SULFA >=320 RESISTANT Resistant     PIP/TAZO <=4 SENSITIVE Sensitive     * ESCHERICHIA COLI   Proteus mirabilis - MIC*    AMPICILLIN <=2 SENSITIVE Sensitive     CEFAZOLIN 8 SENSITIVE Sensitive     CEFTRIAXONE <=1  SENSITIVE Sensitive     CIPROFLOXACIN >=4 RESISTANT Resistant     GENTAMICIN <=1 SENSITIVE Sensitive     LEVOFLOXACIN >=8 RESISTANT Resistant     NITROFURANTOIN 128 RESISTANT Resistant     TOBRAMYCIN <=1 SENSITIVE Sensitive     TRIMETH/SULFA >=320 RESISTANT Resistant     PIP/TAZO <=4 SENSITIVE Sensitive     * PROTEUS MIRABILIS  MRSA PCR Screening     Status: None   Collection Time: 08/07/14 10:26 AM  Result Value Ref Range Status   MRSA by PCR NEGATIVE NEGATIVE Final    Comment:        The GeneXpert MRSA Assay (FDA approved for NASAL specimens only), is one component of a comprehensive MRSA colonization surveillance program. It is not intended to diagnose MRSA infection nor to guide or monitor treatment for MRSA infections.      BRIEF HOSPITAL COURSE:  Sepsis: Secondary to aspiration pneumonia and possible prostatic abscess. Sepsis pathophysiology seems to have resolved with IV antibiotics. See below.  Active Problems:  Suspected aspiration pneumonia: Chest x-ray demonstrated right lower lobe infiltrate, given history of quadriplegia and known history of dysphagia-likely aspirating secretions, some suspicion for reflux of Peg feeds. Underwent FEES 08/11/14-mild oral and severe pharyngeal dysphagia with standing secretions throughout pharynx upon initial entry-recommendations are to remain nothing by mouth with oral nutrition, hydration and medications given via PEG tube.Clinically improved without any fever or leukocytosis, and blood cultures continue to be negative, will stop vancomycin and cefepime on 5/16 and transition him to Rocephin (see below)   ?Prostatic abscess: Urine culture positive for Escherichia coli and Proteus mirabilis-on IV cefepime and vancomycin since admission. This M.D. spoke with  urology-Dr. Marlou Porch on 5/16, recommends at least 4 weeks of antibiotics, urology will arrange for follow-up in the next one month. I spoke with infectious disease-Dr. Daiva Eves over  the phone on 5/16 who reviewed chart, recommendations are to complete 4 weeks of IV antibiotics with Rocephin, and repeat a CT scan of the abdomen or a transrectal ultrasound to see if the abscess has resolved. Please remove PICC line once patient has completed IV antibiotics.   Hx of C Diff Colitis: Unfortunately has had multiple episodes of C. difficile colitis in the past-now with a possible prostatic abscess and requiring prolonged antibiotics. Please watch closely for diarrhea.    Adrenal insufficiency: On admission because of sepsis placed on stress dose hydrocortisone, this was tapered, patient now back on usual regimen    Hypokalemia:replete and recheck   History of seizures: Continue Dilantin.   History of chronic aphagia and spastic quadriparesis: Sustained in 2002 following a acute illness-dictated by viral infection/hyponatremia and hypokalemia. Per family patient was in a "coma".    History of chronic dysphagia: PEG tube in place. Resume PEG feedings-depending on FEES   GERD/?Diabetic Gastroparesis:c/w Pepcid, Reglan.Sleep with head end elevated   Anemia of chronic disease: Hemoglobin currently close to usual baseline of around 9.9. Follow CBC weekly while on Rocephin.   TODAY-DAY OF DISCHARGE:  Subjective:   Yuan Ilgenfritz today has no headache,no chest abdominal pain,no new weakness tingling or numbness  Objective:   Blood pressure 124/63, pulse 62, temperature 98.5 F (36.9 C), temperature source Oral, resp. rate 20, height 5\' 8"  (1.727 m), weight 67.7 kg (149 lb 4 oz), SpO2 97 %.  Intake/Output Summary (Last 24 hours) at 08/12/14 1355 Last data filed at 08/12/14 1317  Gross per 24 hour  Intake      0 ml  Output   1725 ml  Net  -1725 ml   Filed Weights   08/06/14 2327 08/08/14 0544 08/09/14 0540  Weight: 66.1 kg (145 lb 11.6 oz) 68.1 kg (150 lb 2.1 oz) 67.7 kg (149 lb 4 oz)    Exam Awake Alert, Non verbal, quadriplegic at baseline   [place].AT,PERRAL Supple Neck,No JVD, No cervical lymphadenopathy appriciated.  Symmetrical Chest wall movement, Good air movement bilaterally, coarse R basilar rales RRR,No Gallops,Rubs or new Murmurs, No Parasternal Heave +ve B.Sounds, Abd Soft, No tenderness, No organomegaly appriciated, No rebound - guarding or rigidity. PEG site stable No Cyanosis, Clubbing or edema, No new Rash or bruise   DISCHARGE CONDITION: Stable  DISPOSITION: SNF  DISCHARGE INSTRUCTIONS:    Activity:  As tolerated with Full fall precautions use walker/cane & assistance as needed  Diet recommendation: NPO- with oral nutrition, hydration and medications given via PEG tube. Aspiration precautions:yes  Discharge Instructions    Increase activity slowly    Complete by:  As directed            Follow-up Information    Follow up with Crist Fat, MD. Schedule an appointment as soon as possible for a visit in 3 weeks.   Specialty:  Urology   Contact information:   [address] [place] [address] [phone]       Please follow up.   Contact information:   PCP at SNF in next 5 days     Total Time spent on discharge equals  45 minutes.  SignedJeoffrey Massed 08/12/2014 1:55 PM

## 2014-08-12 NOTE — Progress Notes (Addendum)
Nutrition Follow-up  DOCUMENTATION CODES:  Not applicable  INTERVENTION: - RD to manage TF. Continue to goal: Osmolite 1.5 @ 60 mL/hr. Over 18 hour run time, due to Dilantin BID, this will provide 1620 kcal, 68 grams of protein, 823 mL free water - RD to continue to monitor for needs  NUTRITION DIAGNOSIS:  Inadequate oral intake related to inability to eat, dysphagia as evidenced by NPO status. -ongoing  GOAL:  Patient will meet greater than or equal to 90% of their needs -unmet with current TF rate  MONITOR:  TF tolerance, Labs, Weight trends, I & O's  REASON FOR ASSESSMENT:  Consult for RD to manage TF  ASSESSMENT: Per H&P, pt is a 59 y.o. male with history of quadriplegia, adrenal insufficiency, seizures, diabetes mellitus type 2, aphasia, dysphagia on PEG tube feeds, chronic anemia. Pt had 2-3 episodes of emesis 5/10 and another episode in the ED yesterday.  Pt had FEES yesterday and SLP recommended pt to remain NPO. Order in for Osmolite 1.5 @ 20 to advance to goal rate of 55 mL/hr. Pt currently receiving Osmolite 1.5 @ 20 mL/hr with 10 mL free water Q4h. This is providing 600 kcal, 25 grams protein which is not meeting pt's needs. Goal rate will provide 1485 kcal, 62 grams protein which will meet minimal needs. Per order, MD has advised to hold TF 1 hour before and 2 hours after Dilantin administration; this equates to 18 hour run time/day.  Will continue to follow per protocol. Labs and medications reviewed; CBGs: 77-117 mg/dL, K: 3.3 mmol/L, Cl: 96 mmol/L, creatinine and Ca low. Reglan order in place.  Height:  Ht Readings from Last 1 Encounters:  08/06/14 5\' 8"  (1.727 m)    Weight:  Wt Readings from Last 1 Encounters:  08/09/14 149 lb 4 oz (67.7 kg)    Ideal Body Weight:  70 kg (kg)  Wt Readings from Last 10 Encounters:  08/09/14 149 lb 4 oz (67.7 kg)  03/13/14 152 lb (68.947 kg)  01/17/14 174 lb 13.2 oz (79.3 kg)  01/02/14 155 lb 6.8 oz (70.5 kg)   12/11/13 155 lb 6.4 oz (70.489 kg)  10/30/13 156 lb 3.2 oz (70.852 kg)  07/23/13 156 lb (70.761 kg)  06/24/13 159 lb 9.6 oz (72.394 kg)  05/27/13 158 lb 9.6 oz (71.94 kg)  06/15/12 150 lb 2.1 oz (68.1 kg)    BMI:  Body mass index is 22.7 kg/(m^2).  Estimated Nutritional Needs:  Kcal:  1400-1600  Protein:  60-80 grams  Fluid:  2L/day  Skin:  Reviewed, no issues  Diet Order:  Diet NPO time specified  EDUCATION NEEDS:  No education needs identified at this time   Intake/Output Summary (Last 24 hours) at 08/12/14 1303 Last data filed at 08/12/14 0800  Gross per 24 hour  Intake    100 ml  Output   1175 ml  Net  -1075 ml    Last BM:  5/16  Jarome Matin, RD, LDN Inpatient Clinical Dietitian Pager # [phone] After hours/weekend pager # [phone]

## 2014-08-12 NOTE — Progress Notes (Signed)
Subjective: No acute events overnight. Afebrile x 3 days Unable to communicate with patient - mom not in room.  Objective: Vital signs in last 24 hours: Temp:  [97.3 F (36.3 C)-98.5 F (36.9 C)] 98.5 F (36.9 C) (05/17 0448) Pulse Rate:  [55-62] 62 (05/17 0448) Resp:  [20] 20 (05/17 0448) BP: (124-138)/(63) 124/63 mmHg (05/17 0448) SpO2:  [97 %-99 %] 97 % (05/17 0448)A  Intake/Output from previous day: 05/16 0701 - 05/17 0700 In: 200 [IV Piggyback:200] Out: 1575 [Urine:1575] Intake/Output this shift:    Past Medical History  Diagnosis Date  . Quadriplegia   . Depression   . Anemia   . Parkinson's disease   . Seizures   . Adrenal insufficiency   . Dysphagia 06/19/2013  . Aphasia 06/19/2013    Physical Exam:  Lungs - Normal respiratory effort, chest expands symmetrically.  Abdomen - Soft, non-tender & non-distended.  Lab Results:  Recent Labs  08/10/14 0530 08/11/14 0545  WBC 3.5* 7.1  HGB 9.8* 9.9*  HCT 30.0* 31.2*   BMET  Recent Labs  08/11/14 0545 08/12/14 0600  NA 140 137  K 2.7* 2.5*  CL 99* 94*  CO2 31 33*  GLUCOSE 87 101*  BUN 7 8  CREATININE 0.43* 0.51*  CALCIUM 8.2* 8.4*   No results for input(s): LABURIN in the last 72 hours. Results for orders placed or performed during the hospital encounter of 08/06/14  Blood Culture (routine x 2)     Status: None (Preliminary result)   Collection Time: 08/06/14  5:00 PM  Result Value Ref Range Status   Specimen Description BLOOD RFARM  Final   Special Requests BOTTLES DRAWN AEROBIC ONLY 3CC  Final   Culture   Final           BLOOD CULTURE RECEIVED NO GROWTH TO DATE CULTURE WILL BE HELD FOR 5 DAYS BEFORE ISSUING A FINAL NEGATIVE REPORT Performed at [facility]    Report Status PENDING  Incomplete  Blood Culture (routine x 2)     Status: None (Preliminary result)   Collection Time: 08/06/14  5:15 PM  Result Value Ref Range Status   Specimen Description BLOOD RAC  Final   Special  Requests BOTTLES DRAWN AEROBIC AND ANAEROBIC 5CC  Final   Culture   Final           BLOOD CULTURE RECEIVED NO GROWTH TO DATE CULTURE WILL BE HELD FOR 5 DAYS BEFORE ISSUING A FINAL NEGATIVE REPORT Performed at [facility]    Report Status PENDING  Incomplete  Urine culture     Status: None   Collection Time: 08/06/14  5:30 PM  Result Value Ref Range Status   Specimen Description URINE, CATHETERIZED  Final   Special Requests NONE  Final   Colony Count   Final    >=100,000 COLONIES/ML Performed at [facility]    Culture   Final    [place] Performed at [facility]    Report Status 08/09/2014 FINAL  Final   Organism ID, Bacteria ESCHERICHIA COLI  Final   Organism ID, Bacteria PROTEUS MIRABILIS  Final      Susceptibility   Escherichia coli - MIC*    AMPICILLIN >=32 RESISTANT Resistant     CEFAZOLIN <=4 SENSITIVE Sensitive     CEFTRIAXONE <=1 SENSITIVE Sensitive     CIPROFLOXACIN >=4 RESISTANT Resistant     GENTAMICIN <=1 SENSITIVE Sensitive     LEVOFLOXACIN >=8 RESISTANT Resistant  NITROFURANTOIN <=16 SENSITIVE Sensitive     TOBRAMYCIN <=1 SENSITIVE Sensitive     TRIMETH/SULFA >=320 RESISTANT Resistant     PIP/TAZO <=4 SENSITIVE Sensitive     * ESCHERICHIA COLI   Proteus mirabilis - MIC*    AMPICILLIN <=2 SENSITIVE Sensitive     CEFAZOLIN 8 SENSITIVE Sensitive     CEFTRIAXONE <=1 SENSITIVE Sensitive     CIPROFLOXACIN >=4 RESISTANT Resistant     GENTAMICIN <=1 SENSITIVE Sensitive     LEVOFLOXACIN >=8 RESISTANT Resistant     NITROFURANTOIN 128 RESISTANT Resistant     TOBRAMYCIN <=1 SENSITIVE Sensitive     TRIMETH/SULFA >=320 RESISTANT Resistant     PIP/TAZO <=4 SENSITIVE Sensitive     * PROTEUS MIRABILIS  MRSA PCR Screening     Status: None   Collection Time: 08/07/14 10:26 AM  Result Value Ref Range Status   MRSA by PCR NEGATIVE NEGATIVE Final    Comment:        The GeneXpert MRSA Assay (FDA approved for NASAL  specimens only), is one component of a comprehensive MRSA colonization surveillance program. It is not intended to diagnose MRSA infection nor to guide or monitor treatment for MRSA infections.     Studies/Results:  Assessment/Plan: ? Prostatic abscess as etiology of fever - should be treated for 4 weeks.  IV antibiotics are SNF seem like best option.  I will see him in clinic in 1 month and we will get a transrectal u/s to re-evaluate his prostate.  Louis Meckel W 08/12/2014, 8:01 AM

## 2014-08-13 ENCOUNTER — Inpatient Hospital Stay (HOSPITAL_COMMUNITY): Payer: Medicare Other

## 2014-08-13 DIAGNOSIS — N12 Tubulo-interstitial nephritis, not specified as acute or chronic: Secondary | ICD-10-CM

## 2014-08-13 LAB — BASIC METABOLIC PANEL
ANION GAP: 11 (ref 5–15)
BUN: 10 mg/dL (ref 6–20)
CO2: 31 mmol/L (ref 22–32)
Calcium: 8.6 mg/dL — ABNORMAL LOW (ref 8.9–10.3)
Chloride: 97 mmol/L — ABNORMAL LOW (ref 101–111)
Creatinine, Ser: 0.51 mg/dL — ABNORMAL LOW (ref 0.61–1.24)
GFR calc non Af Amer: >60 mL/min (ref >60)
Glucose, Bld: 98 mg/dL (ref 65–99)
Potassium: 3.3 mmol/L — ABNORMAL LOW (ref 3.5–5.1)
Sodium: 139 mmol/L (ref 135–145)

## 2014-08-13 LAB — CLOSTRIDIUM DIFFICILE BY PCR: Toxigenic C. Difficile by PCR: NEGATIVE

## 2014-08-13 LAB — GLUCOSE, CAPILLARY
Glucose-Capillary: 110 mg/dL — ABNORMAL HIGH (ref 65–99)
Glucose-Capillary: 89 mg/dL (ref 65–99)

## 2014-08-13 MED ORDER — LIDOCAINE HCL 1 % IJ SOLN
INTRAMUSCULAR | Status: DC
Start: 2014-08-13 — End: 2014-08-13
  Filled 2014-08-13: qty 20

## 2014-08-13 MED ORDER — POTASSIUM CHLORIDE 20 MEQ/15ML (10%) PO SOLN
40.0000 meq | Freq: Once | ORAL | Status: AC
  Administered 2014-08-13: 40 meq
  Filled 2014-08-13: qty 30

## 2014-08-13 NOTE — Progress Notes (Signed)
[name] to be D/C'd Skilled nursing facility per MD order.  Discussed prescriptions and follow up appointments with the patient. Prescriptions given to patient, medication list explained in detail. Pt verbalized understanding.    Medication List    STOP taking these medications        feeding supplement (PRO-STAT SUGAR FREE 64) Liqd     gentamicin-prednisoLONE 0.3-1 % ophthalmic drops     PRESCRIPTION MEDICATION     vancomycin 50 mg/mL oral solution  Commonly known as:  VANCOCIN      TAKE these medications        acetaminophen 325 MG tablet  Commonly known as:  TYLENOL  Place 2 tablets (650 mg total) into feeding tube every 6 (six) hours as needed for fever (Give 2 tablets via g-tube every 6 hours as needed for a fever greater than 101 degrees F).     baclofen 10 MG tablet  Commonly known as:  LIORESAL  Place 1 tablet (10 mg total) into feeding tube 3 (three) times daily.     bisacodyl 10 MG suppository  Commonly known as:  DULCOLAX  Place 10 mg rectally daily as needed for moderate constipation. If constipation not relieved by milk of magnesia give one 10 mg suppository in 24hours     cefTRIAXone 40 MG/ML IVPB  Commonly known as:  ROCEPHIN  Inject 50 mLs (2 g total) into the vein daily. For 4 weeks from 08/07/14.     CENTAMIN Liqd  5 mLs by PEG Tube route daily.     famotidine 20 MG tablet  Commonly known as:  PEPCID  Place 1 tablet (20 mg total) into feeding tube 2 (two) times daily. Give 1 tablet via peg tube twice daily     feeding supplement (OSMOLITE 1.5 CAL) Liqd  Place 1,000 mLs into feeding tube daily. At 55 cc/hr     ferrous sulfate 220 (44 FE) MG/5ML solution  Place 330 mg into feeding tube See admin instructions. Give 7.62ml  Per tube every morning     folic acid 1 MG tablet  Commonly known as:  FOLVITE  1 mg by PEG Tube route daily.     free water Soln  Place 250 mLs into feeding tube every 4 (four) hours.     hydrocortisone 5 mg/mL Susp   Commonly known as:  CORTEF  Place 5 mLs (25 mg total) into feeding tube daily at 6 (six) AM.     hydrocortisone 5 mg/mL Susp  Commonly known as:  CORTEF  Place 3 mLs (15 mg total) into feeding tube every evening.     loratadine 10 MG tablet  Commonly known as:  CLARITIN  10 mg by PEG Tube route daily.     metoCLOPramide 10 MG tablet  Commonly known as:  REGLAN  Place 1 tablet (10 mg total) into feeding tube 4 (four) times daily -  before meals and at bedtime. Via Peg tube     olopatadine 0.1 % ophthalmic solution  Commonly known as:  PATANOL  Place 1 drop into both eyes every morning.     phenytoin 125 MG/5ML suspension  Commonly known as:  DILANTIN  Place 4 mLs (100 mg total) into feeding tube 2 (two) times daily.     polyethylene glycol packet  Commonly known as:  MIRALAX / GLYCOLAX  Place 17 g into feeding tube daily.     polyvinyl alcohol 1.4 % ophthalmic solution  Commonly known as:  LIQUIFILM TEARS  Place 1 drop  into both eyes daily.     saccharomyces boulardii 250 MG capsule  Commonly known as:  FLORASTOR  250 mg by PEG Tube route.        Filed Vitals:   08/13/14 0500  BP: 132/77  Pulse: 76  Temp: 98.4 F (36.9 C)  Resp: 20    Skin clean, dry and intact without evidence of skin break down, no evidence of skin tears noted. IV catheter discontinued intact. Site without signs and symptoms of complications. Dressing and pressure applied. Pt denies pain at this time. No complaints noted.  An After Visit Summary was printed and given to the patient. Patient escorted via [place], and D/C home via private auto.  Nonie Hoyer S 08/13/2014 4:10 PM

## 2014-08-13 NOTE — Progress Notes (Signed)
PATIENT DETAILS Name: [name] Age: 59 y.o. Sex: male Date of Birth: 01-04-1956 Admit Date: 08/06/2014 Admitting Physician Eduard Clos, MD PCP:No primary care provider on file.  Subjective: No major issues overnight. Awaiting for PICC line placement. Mother at bedside  Assessment/Plan: Principal Problem:   Sepsis: Secondary to aspiration pneumonia and possible prostatic abscess. Sepsis pathophysiology has resolved with IV antibiotics. See below.  Active Problems:   Suspected aspiration pneumonia: Chest x-ray demonstrated right lower lobe infiltrate, given history of quadriplegia and known history of dysphagia-likely aspirating secretions, some suspicion for reflux of Peg feeds. Scheduled for FEES 08/11/14.Clinically improved without any fever or leukocytosis, and blood cultures continue to be negative, will stop vancomycin and cefepime on 5/16 and transition him to Rocephin.     ?Prostatic abscess: Urine culture positive for Escherichia coli and Proteus mirabilis-on IV cefepime and vancomycin since admission. This M.D. spoke with urology-Dr. Marlou Porch on 5/16, recommends at least 4 weeks of antibiotics, urology will arrange for follow-up in the next one month. I spoke with infectious disease-Dr. Daiva Eves over the phone who reviewed chart, recommendations are to complete 4 weeks of IV antibiotics with Rocephin, and repeat a CT scan of the abdomen to see if the abscess has resolved. Awaiting PICC line placement and then discharged to SNF.     Rest of his medical issues remain stable    Disposition: SNF today  Antimicrobial agents  See below  Anti-infectives    Start     Dose/Rate Route Frequency Ordered Stop   08/12/14 0000  cefTRIAXone (ROCEPHIN) 40 MG/ML IVPB     2 g 100 mL/hr over 30 Minutes Intravenous Every 24 hours 08/12/14 1343     08/11/14 1600  cefTRIAXone (ROCEPHIN) 2 g in dextrose 5 % 50 mL IVPB - Premix     2 g 100 mL/hr over 30 Minutes  Intravenous Every 24 hours 08/11/14 1441     08/09/14 0600  vancomycin (VANCOCIN) IVPB 750 mg/150 ml premix  Status:  Discontinued     750 mg 150 mL/hr over 60 Minutes Intravenous Every 8 hours 08/08/14 2316 08/11/14 1426   08/07/14 1000  vancomycin (VANCOCIN) IVPB 750 mg/150 ml premix  Status:  Discontinued     750 mg 150 mL/hr over 60 Minutes Intravenous Every 12 hours 08/07/14 0518 08/08/14 2315   08/07/14 0800  ceFEPIme (MAXIPIME) 1 g in dextrose 5 % 50 mL IVPB  Status:  Discontinued     1 g 100 mL/hr over 30 Minutes Intravenous Every 8 hours 08/07/14 0518 08/11/14 1441   08/06/14 2330  vancomycin (VANCOCIN) IVPB 1000 mg/200 mL premix     1,000 mg 200 mL/hr over 60 Minutes Intravenous  Once 08/06/14 2327 08/07/14 0205   08/06/14 2330  ceFEPIme (MAXIPIME) 2 g in dextrose 5 % 50 mL IVPB     2 g 100 mL/hr over 30 Minutes Intravenous  Once 08/06/14 2327 08/07/14 0136   08/06/14 2015  cefTRIAXone (ROCEPHIN) 1 g in dextrose 5 % 50 mL IVPB     1 g 100 mL/hr over 30 Minutes Intravenous  Once 08/06/14 2013 08/06/14 2152      MEDICATIONS: Scheduled Meds: . baclofen  10 mg Per Tube TID  . cefTRIAXone (ROCEPHIN)  IV  2 g Intravenous Q24H  . docusate  200 mg Oral BID  . enoxaparin (LOVENOX) injection  40 mg Subcutaneous QHS  . famotidine  20 mg  Oral BID  . feeding supplement (OSMOLITE 1.5 CAL)  1,000 mL Per Tube Q24H  . ferrous sulfate  300 mg Per Tube Daily  . folic acid  1 mg Oral Daily  . hydrocortisone  15 mg Per Tube q1800  . hydrocortisone  25 mg Per Tube Q24H  . insulin aspart  0-9 Units Subcutaneous TID WC  . lidocaine      . loratadine  10 mg Per Tube Daily  . metoCLOPramide  10 mg Oral TID AC & HS  . multivitamin  5 mL Per Tube Daily  . olopatadine  1 drop Both Eyes q morning - 10a  . pantoprazole sodium  40 mg Per Tube BID  . phenytoin  100 mg Per Tube BID  . polyvinyl alcohol  1 drop Both Eyes Daily  . saccharomyces boulardii  250 mg Oral BID   Continuous Infusions:    PRN Meds:.acetaminophen **OR** acetaminophen, bisacodyl, guaiFENesin-codeine, ondansetron **OR** ondansetron (ZOFRAN) IV    PHYSICAL EXAM: Vital signs in last 24 hours: Filed Vitals:   08/12/14 0448 08/12/14 1412 08/12/14 2133 08/13/14 0500  BP: 124/63 124/65 127/70 132/77  Pulse: 62 61 60 76  Temp: 98.5 F (36.9 C) 98.5 F (36.9 C) 98.2 F (36.8 C) 98.4 F (36.9 C)  TempSrc: Oral Axillary Oral Oral  Resp: 20 20 20 20   Height:      Weight:      SpO2: 97% 98% 99% 100%    Weight change:  Filed Weights   08/06/14 2327 08/08/14 0544 08/09/14 0540  Weight: 66.1 kg (145 lb 11.6 oz) 68.1 kg (150 lb 2.1 oz) 67.7 kg (149 lb 4 oz)   Body mass index is 22.7 kg/(m^2).   Awake Alert, Non verbal, quadriplegic at baseline  [place].AT,PERRAL Supple Neck,No JVD, No cervical lymphadenopathy appriciated.  Symmetrical Chest wall movement, Good air movement bilaterally, coarse R basilar rales RRR,No Gallops,Rubs or new Murmurs, No Parasternal Heave +ve B.Sounds, Abd Soft, No tenderness, No organomegaly appriciated, No rebound - guarding or rigidity. PEG site stable No Cyanosis, Clubbing or edema, No new Rash or bruise   Intake/Output from previous day:  Intake/Output Summary (Last 24 hours) at 08/13/14 1529 Last data filed at 08/13/14 0602  Gross per 24 hour  Intake    468 ml  Output    450 ml  Net     18 ml     LAB RESULTS: CBC  Recent Labs Lab 08/06/14 1719 08/07/14 0545 08/08/14 0455 08/10/14 0530 08/11/14 0545  WBC 8.7 9.6 8.4 3.5* 7.1  HGB 11.7* 10.2* 10.6* 9.8* 9.9*  HCT 36.2* 32.8* 32.6* 30.0* 31.2*  PLT 223 185 133* 161 175  MCV 73.7* 74.0* 73.4* 72.1* 72.2*  MCH 23.8* 23.0* 23.9* 23.6* 22.9*  MCHC 32.3 31.1 32.5 32.7 31.7  RDW 14.5 14.7 14.6 14.4 14.2  LYMPHSABS 1.3 0.8  --   --   --   MONOABS 1.1* 0.7  --   --   --   EOSABS 0.1 0.0  --   --   --   BASOSABS 0.0 0.0  --   --   --     Chemistries   Recent Labs Lab 08/10/14 0530 08/11/14 0545  08/12/14 0600 08/12/14 1150 08/13/14 0551  NA 138 140 137 139 139  K 3.2* 2.7* 2.5* 3.3* 3.3*  CL 99* 99* 94* 96* 97*  CO2 29 31 33* 32 31  GLUCOSE 114* 87 101* 103* 98  BUN 7 7 8 7  10  CREATININE 0.40* 0.43* 0.51* 0.47* 0.51*  CALCIUM 8.1* 8.2* 8.4* 8.2* 8.6*  MG  --   --  2.0  --   --     CBG:  Recent Labs Lab 08/12/14 1200 08/12/14 1627 08/12/14 2146 08/13/14 0728 08/13/14 1221  GLUCAP 107* 90 99 110* 89    GFR Estimated Creatinine Clearance: 97.6 mL/min (by C-G formula based on Cr of 0.51).  Coagulation profile  Recent Labs Lab 08/06/14 2330  INR 1.26    Cardiac Enzymes No results for input(s): CKMB, TROPONINI, MYOGLOBIN in the last 168 hours.  Invalid input(s): CK  Invalid input(s): POCBNP No results for input(s): DDIMER in the last 72 hours. No results for input(s): HGBA1C in the last 72 hours. No results for input(s): CHOL, HDL, LDLCALC, TRIG, CHOLHDL, LDLDIRECT in the last 72 hours. No results for input(s): TSH, T4TOTAL, T3FREE, THYROIDAB in the last 72 hours.  Invalid input(s): FREET3 No results for input(s): VITAMINB12, FOLATE, FERRITIN, TIBC, IRON, RETICCTPCT in the last 72 hours. No results for input(s): LIPASE, AMYLASE in the last 72 hours.  Urine Studies No results for input(s): UHGB, CRYS in the last 72 hours.  Invalid input(s): UACOL, UAPR, USPG, UPH, UTP, UGL, UKET, UBIL, UNIT, UROB, ULEU, UEPI, UWBC, URBC, UBAC, CAST, UCOM, BILUA  MICROBIOLOGY: Recent Results (from the past 240 hour(s))  Blood Culture (routine x 2)     Status: None   Collection Time: 08/06/14  5:00 PM  Result Value Ref Range Status   Specimen Description BLOOD RFARM  Final   Special Requests BOTTLES DRAWN AEROBIC ONLY 3CC  Final   Culture   Final    NO GROWTH 5 DAYS Performed at [facility]    Report Status 08/12/2014 FINAL  Final  Blood Culture (routine x 2)     Status: None   Collection Time: 08/06/14  5:15 PM  Result Value Ref Range Status    Specimen Description BLOOD RAC  Final   Special Requests BOTTLES DRAWN AEROBIC AND ANAEROBIC 5CC  Final   Culture   Final    NO GROWTH 5 DAYS Performed at [facility]    Report Status 08/12/2014 FINAL  Final  Urine culture     Status: None   Collection Time: 08/06/14  5:30 PM  Result Value Ref Range Status   Specimen Description URINE, CATHETERIZED  Final   Special Requests NONE  Final   Colony Count   Final    >=100,000 COLONIES/ML Performed at [facility]    Culture   Final    ESCHERICHIA COLI PROTEUS MIRABILIS Performed at [facility]    Report Status 08/09/2014 FINAL  Final   Organism ID, Bacteria ESCHERICHIA COLI  Final   Organism ID, Bacteria PROTEUS MIRABILIS  Final      Susceptibility   Escherichia coli - MIC*    AMPICILLIN >=32 RESISTANT Resistant     CEFAZOLIN <=4 SENSITIVE Sensitive     CEFTRIAXONE <=1 SENSITIVE Sensitive     CIPROFLOXACIN >=4 RESISTANT Resistant     GENTAMICIN <=1 SENSITIVE Sensitive     LEVOFLOXACIN >=8 RESISTANT Resistant     NITROFURANTOIN <=16 SENSITIVE Sensitive     TOBRAMYCIN <=1 SENSITIVE Sensitive     TRIMETH/SULFA >=320 RESISTANT Resistant     PIP/TAZO <=4 SENSITIVE Sensitive     * ESCHERICHIA COLI   Proteus mirabilis - MIC*    AMPICILLIN <=2 SENSITIVE Sensitive     CEFAZOLIN 8 SENSITIVE Sensitive     CEFTRIAXONE <=  1 SENSITIVE Sensitive     CIPROFLOXACIN >=4 RESISTANT Resistant     GENTAMICIN <=1 SENSITIVE Sensitive     LEVOFLOXACIN >=8 RESISTANT Resistant     NITROFURANTOIN 128 RESISTANT Resistant     TOBRAMYCIN <=1 SENSITIVE Sensitive     TRIMETH/SULFA >=320 RESISTANT Resistant     PIP/TAZO <=4 SENSITIVE Sensitive     * PROTEUS MIRABILIS  MRSA PCR Screening     Status: None   Collection Time: 08/07/14 10:26 AM  Result Value Ref Range Status   MRSA by PCR NEGATIVE NEGATIVE Final    Comment:        The GeneXpert MRSA Assay (FDA approved for NASAL specimens only), is one component of  a comprehensive MRSA colonization surveillance program. It is not intended to diagnose MRSA infection nor to guide or monitor treatment for MRSA infections.   Clostridium Difficile by PCR     Status: None   Collection Time: 08/13/14  5:44 AM  Result Value Ref Range Status   C difficile by pcr NEGATIVE NEGATIVE Final    RADIOLOGY STUDIES/RESULTS: Ct Abdomen Pelvis W Contrast  08/06/2014   CLINICAL DATA:  Fever, cough, abdominal distention. Unable to tolerate contrast via the PEG. Patient is quadriplegic. Nausea and vomiting.  EXAM: CT ABDOMEN AND PELVIS WITH CONTRAST  TECHNIQUE: Multidetector CT imaging of the abdomen and pelvis was performed using the standard protocol following bolus administration of intravenous contrast.  CONTRAST:  OMNIPAQUE IOHEXOL 300 MG/ML  SOLN  COMPARISON:  CT chest abdomen and pelvis 03/11/2014  FINDINGS: Airspace infiltration in both lung bases, most prominent on the right. This suggests probable pneumonia versus edema. Coronary artery calcifications.  The liver, spleen, gallbladder, pancreas, adrenal glands, abdominal aorta, inferior vena cava, and retroperitoneal lymph nodes are unremarkable. Low-attenuation lesions in the left kidney consistent with cysts. Largest measures 2.4 cm diameter. No hydronephrosis in either kidney. Gastrostomy tube is positioned with balloon in the stomach. Mild infiltration in the subcutaneous fat around the tube insertion site. Stomach, small bowel, and colon are mostly decompressed. Scattered stool in the colon. No free air or free fluid in the abdomen. Abdominal wall musculature appears intact.  Pelvis: Appendix is normal. Stool-filled rectosigmoid colon. Prostate gland is enlarged since prior study with suggestion of low-attenuation and peripheral enhancement demonstrated in the right lobe of the prostate gland. This was not definitely present previously. This could represent prostate lesions such as prostatitis with focal abscess.  There is diffuse wall thickening of the bladder with asymmetric prominence in the bladder base. This may indicate cystitis although bladder hypertrophy or neoplasm are not excluded. No free or loculated pelvic fluid collections. No pelvic lymphadenopathy. Endplate compression demonstrated at L5, L3, T12, and T11 levels without change since prior study. No destructive bone lesions are identified.  IMPRESSION: Infiltration in both lung bases, mostly on the right suggesting pneumonia. Diffuse and asymmetric wall thickening of the bladder may indicate cystitis or infiltrating process such as neoplasm. Lesion in the right lobe of the prostate may represent prostatitis with prostatic abscess.   Electronically Signed   By: Burman Nieves M.D.   On: 08/06/2014 23:11   Ir US Guide Vasc Access Left  08/13/2014   CLINICAL DATA:  Prostate abscess, needs long-term IV access  EXAM: PICC PLACEMENT WITH ULTRASOUND AND FLUOROSCOPY  FLUOROSCOPY TIME:  0.6 minutes, 2.4 mGy  TECHNIQUE: After written informed consent was obtained, patient was placed in the supine position on angiographic table. Patency of the left brachial vein was  confirmed with ultrasound with image documentation. An appropriate skin site was determined. Skin site was marked. Region was prepped using maximum barrier technique including cap and mask, sterile gown, sterile gloves, large sterile sheet, and Chlorhexidine as cutaneous antisepsis. The region was infiltrated locally with 1% lidocaine. Under real-time ultrasound guidance, the left brachial vein was accessed with a 21 gauge micropuncture needle; the needle tip within the vein was confirmed with ultrasound image documentation. Needle exchanged over a 018 guidewire for a peel-away sheath, through which a 5-French single-lumen power injectable PICC trimmed to 47cm was advanced, positioned with its tip near the cavoatrial junction. Spot chest radiograph confirms appropriate catheter position. Catheter was  flushed per protocol and secured externally. The patient tolerated procedure well.  COMPLICATIONS: COMPLICATIONS none  IMPRESSION: 1. Technically successful five [place] single lumen power injectable PICC placement   Electronically Signed   By: Corlis Leak M.D.   On: 08/13/2014 12:26   Dg Chest Port 1 View  08/08/2014   CLINICAL DATA:  Shortness of breath  EXAM: PORTABLE CHEST - 1 VIEW  COMPARISON:  08/06/2014  FINDINGS: Pneumonia has developed in the right lung, most pronounced in the right lower lobe. Left lung remains clear. No effusions. No cardiac enlargement. Chronic atherosclerosis of the aorta.  IMPRESSION: Development of pneumonia in the right lung, worst in the right lower lobe.   Electronically Signed   By: Paulina Fusi M.D.   On: 08/08/2014 11:01   Dg Chest Port 1 View  08/06/2014   CLINICAL DATA:  Cough and fever  EXAM: PORTABLE CHEST - 1 VIEW  COMPARISON:  Chest radiograph March 10, 2014; chest CT June 02, 2014.  FINDINGS: There is no edema or consolidation. Heart size and pulmonary vascularity are normal. No adenopathy. No bone lesions.  IMPRESSION: No edema or consolidation.   Electronically Signed   By: Bretta Bang III M.D.   On: 08/06/2014 17:03   Ir Fluoro Guide Cv Midline Picc Left  08/13/2014   CLINICAL DATA:  Prostate abscess, needs long-term IV access  EXAM: PICC PLACEMENT WITH ULTRASOUND AND FLUOROSCOPY  FLUOROSCOPY TIME:  0.6 minutes, 2.4 mGy  TECHNIQUE: After written informed consent was obtained, patient was placed in the supine position on angiographic table. Patency of the left brachial vein was confirmed with ultrasound with image documentation. An appropriate skin site was determined. Skin site was marked. Region was prepped using maximum barrier technique including cap and mask, sterile gown, sterile gloves, large sterile sheet, and Chlorhexidine as cutaneous antisepsis. The region was infiltrated locally with 1% lidocaine. Under real-time ultrasound guidance, the left  brachial vein was accessed with a 21 gauge micropuncture needle; the needle tip within the vein was confirmed with ultrasound image documentation. Needle exchanged over a 018 guidewire for a peel-away sheath, through which a 5-French single-lumen power injectable PICC trimmed to 47cm was advanced, positioned with its tip near the cavoatrial junction. Spot chest radiograph confirms appropriate catheter position. Catheter was flushed per protocol and secured externally. The patient tolerated procedure well.  COMPLICATIONS: COMPLICATIONS none  IMPRESSION: 1. Technically successful five [place] single lumen power injectable PICC placement   Electronically Signed   By: Corlis Leak M.D.   On: 08/13/2014 12:26    Jeoffrey Massed, MD  [facility] Pager:[phone]  If 7PM-7AM, please contact night-coverage [email or web address] Password [email or web address] 08/13/2014, 3:29 PM   LOS: 7 days

## 2014-08-13 NOTE — Procedures (Signed)
L arm PowerPICC placed under US and fluoroscopy No ptx on spot chest radiograph. No complication No blood loss. See complete dictation in [facility] PACS.  

## 2014-08-13 NOTE — Progress Notes (Signed)
Patient for d/c today to SNF bed at [facility] as prior to admission. Family (mom) and patient agreeable to this- patient smiling and seems excited to be going back to facility - plan transfer via EMS. Eduard Clos, MSW, Latanya Presser [phone]

## 2014-08-14 DIAGNOSIS — R1312 Dysphagia, oropharyngeal phase: Secondary | ICD-10-CM | POA: Diagnosis not present

## 2014-08-14 DIAGNOSIS — J690 Pneumonitis due to inhalation of food and vomit: Secondary | ICD-10-CM | POA: Diagnosis not present

## 2014-08-14 DIAGNOSIS — E2740 Unspecified adrenocortical insufficiency: Secondary | ICD-10-CM | POA: Diagnosis not present

## 2014-08-15 ENCOUNTER — Encounter: Payer: Self-pay | Admitting: Internal Medicine

## 2014-08-15 ENCOUNTER — Non-Acute Institutional Stay (SKILLED_NURSING_FACILITY): Payer: Medicare Other | Admitting: Internal Medicine

## 2014-08-15 DIAGNOSIS — R1310 Dysphagia, unspecified: Secondary | ICD-10-CM

## 2014-08-15 DIAGNOSIS — A047 Enterocolitis due to Clostridium difficile: Secondary | ICD-10-CM | POA: Diagnosis not present

## 2014-08-15 DIAGNOSIS — K3184 Gastroparesis: Secondary | ICD-10-CM

## 2014-08-15 DIAGNOSIS — G8250 Quadriplegia, unspecified: Secondary | ICD-10-CM

## 2014-08-15 DIAGNOSIS — N412 Abscess of prostate: Secondary | ICD-10-CM | POA: Insufficient documentation

## 2014-08-15 DIAGNOSIS — R569 Unspecified convulsions: Secondary | ICD-10-CM | POA: Diagnosis not present

## 2014-08-15 DIAGNOSIS — E2740 Unspecified adrenocortical insufficiency: Secondary | ICD-10-CM | POA: Diagnosis not present

## 2014-08-15 DIAGNOSIS — J690 Pneumonitis due to inhalation of food and vomit: Secondary | ICD-10-CM | POA: Insufficient documentation

## 2014-08-15 DIAGNOSIS — E876 Hypokalemia: Secondary | ICD-10-CM

## 2014-08-15 DIAGNOSIS — R1312 Dysphagia, oropharyngeal phase: Secondary | ICD-10-CM | POA: Diagnosis not present

## 2014-08-15 DIAGNOSIS — A0472 Enterocolitis due to Clostridium difficile, not specified as recurrent: Secondary | ICD-10-CM

## 2014-08-15 DIAGNOSIS — E1143 Type 2 diabetes mellitus with diabetic autonomic (poly)neuropathy: Secondary | ICD-10-CM | POA: Diagnosis not present

## 2014-08-15 NOTE — Assessment & Plan Note (Signed)
:   PEG tube in place. Resume PEG feedings-depending on FEES

## 2014-08-15 NOTE — Assessment & Plan Note (Signed)
Repleted. °

## 2014-08-15 NOTE — Assessment & Plan Note (Signed)
:   Urine culture positive for Escherichia coli and Proteus mirabilis-on IV cefepime and vancomycin since admission. This M.D. spoke with urology-Dr. Louis Meckel on 5/16, recommends at least 4 weeks of antibiotics, urology will arrange for follow-up in the next one month. I spoke with infectious disease-Dr. Tommy Medal over the phone on 5/16 who reviewed chart, recommendations are to complete 4 weeks of IV antibiotics with Rocephin, and repeat a CT scan of the abdomen or a transrectal ultrasound to see if the abscess has resolved. Please remove PICC line once patient has completed IV antibiotics.

## 2014-08-15 NOTE — Assessment & Plan Note (Signed)
Unfortunately has had multiple episodes of C. difficile colitis in the past-now with a possible prostatic abscess and requiring prolonged antibiotics. Please watch closely for diarrhea.

## 2014-08-15 NOTE — Assessment & Plan Note (Signed)
:   On admission because of sepsis placed on stress dose hydrocortisone, this was tapered, patient now back on usual regimen

## 2014-08-15 NOTE — Assessment & Plan Note (Signed)
Continue dilantin.  

## 2014-08-15 NOTE — Assessment & Plan Note (Signed)
:   Chest x-ray demonstrated right lower lobe infiltrate, given history of quadriplegia and known history of dysphagia-likely aspirating secretions, some suspicion for reflux of Peg feeds. Underwent FEES 08/11/14-mild oral and severe pharyngeal dysphagia with standing secretions throughout pharynx upon initial entry-recommendations are to remain nothing by mouth with oral nutrition, hydration and medications given via PEG tube.Clinically improved without any fever or leukocytosis, and blood cultures continue to be negative, will stop vancomycin and cefepime on 5/16 and transition him to Rocephin (see below)

## 2014-08-15 NOTE — Assessment & Plan Note (Signed)
Reglan

## 2014-08-15 NOTE — Assessment & Plan Note (Signed)
Unchanged for years

## 2014-08-15 NOTE — Progress Notes (Signed)
MRN: [id number] Name: [name]  Sex: male Age: 59 y.o. DOB: 06-29-1955  PSC #: [facility] Facility/Room:214 Level Of Care: SNF Provider: Merrilee Seashore D Emergency Contacts: Extended Emergency Contact Information Primary Emergency Contact: Hett,[name] Address: [address]          [place], [place] [address] [place] Home Phone: [phone] Mobile Phone: [phone] Relation: Mother Secondary Emergency Contact: Valinda Party [place] Home Phone: [phone] Relation: Brother Father: [place], [place] Mobile Phone: [phone]  Code Status: FULl  Allergies: Aspirin; Nsaids; and Penicillins  Chief Complaint  Patient presents with  . New Admit To SNF    HPI: Patient is 59 y.o. male with quadriplegia who gets septic and/or infected every  3-4 months who was hospitalized with suspected aspiration PNA and prostate abscess was found and urine grew out 2 different microbes admitted to SNF for 4 weeks antibiotics.  Past Medical History  Diagnosis Date  . Quadriplegia   . Depression   . Anemia   . Parkinson's disease   . Seizures   . Adrenal insufficiency   . Dysphagia 06/19/2013  . Aphasia 06/19/2013    Past Surgical History  Procedure Laterality Date  . Peg tube placement    . Peripherally inserted central catheter insertion    . Tonsillectomy  1962      Medication List       This list is accurate as of: 08/15/14  9:16 PM.  Always use your most recent med list.               acetaminophen 325 MG tablet  Commonly known as:  TYLENOL  Place 2 tablets (650 mg total) into feeding tube every 6 (six) hours as needed for fever (Give 2 tablets via g-tube every 6 hours as needed for a fever greater than 101 degrees F).     baclofen 10 MG tablet  Commonly known as:  LIORESAL  Place 1 tablet (10 mg total) into feeding tube 3 (three) times daily.     bisacodyl 10 MG suppository  Commonly known as:   DULCOLAX  Place 10 mg rectally daily as needed for moderate constipation. If constipation not relieved by milk of magnesia give one 10 mg suppository in 24hours     cefTRIAXone 40 MG/ML IVPB  Commonly known as:  ROCEPHIN  Inject 50 mLs (2 g total) into the vein daily. For 4 weeks from 08/07/14.     CENTAMIN Liqd  5 mLs by PEG Tube route daily.     famotidine 20 MG tablet  Commonly known as:  PEPCID  Place 1 tablet (20 mg total) into feeding tube 2 (two) times daily. Give 1 tablet via peg tube twice daily     feeding supplement (OSMOLITE 1.5 CAL) Liqd  Place 1,000 mLs into feeding tube daily. At 55 cc/hr     ferrous sulfate 220 (44 FE) MG/5ML solution  Place 330 mg into feeding tube See admin instructions. Give 7.104ml  Per tube every morning     folic acid 1 MG tablet  Commonly known as:  FOLVITE  1 mg by PEG Tube route daily.     free water Soln  Place 250 mLs into feeding tube every 4 (four) hours.     hydrocortisone 5 mg/mL Susp  Commonly known as:  CORTEF  Place 5 mLs (25 mg total) into feeding tube daily at 6 (six) AM.     hydrocortisone 5 mg/mL Susp  Commonly known as:  CORTEF  Place 3 mLs (15 mg total) into feeding tube every evening.     loratadine 10 MG tablet  Commonly known as:  CLARITIN  10 mg by PEG Tube route daily.     metoCLOPramide 10 MG tablet  Commonly known as:  REGLAN  Place 1 tablet (10 mg total) into feeding tube 4 (four) times daily -  before meals and at bedtime. Via Peg tube     olopatadine 0.1 % ophthalmic solution  Commonly known as:  PATANOL  Place 1 drop into both eyes every morning.     phenytoin 125 MG/5ML suspension  Commonly known as:  DILANTIN  Place 4 mLs (100 mg total) into feeding tube 2 (two) times daily.     polyethylene glycol packet  Commonly known as:  MIRALAX / GLYCOLAX  Place 17 g into feeding tube daily.     polyvinyl alcohol 1.4 % ophthalmic solution  Commonly known as:  LIQUIFILM TEARS  Place 1 drop into both eyes  daily.     saccharomyces boulardii 250 MG capsule  Commonly known as:  FLORASTOR  250 mg by PEG Tube route.        No orders of the defined types were placed in this encounter.    Immunization History  Administered Date(s) Administered  . Influenza Whole 12/31/2012  . Influenza-Unspecified 01/07/2014  . PPD Test 08/22/2008  . Pneumococcal-Unspecified 06/23/2010    History  Substance Use Topics  . Smoking status: Never Smoker   . Smokeless tobacco: Never Used  . Alcohol Use: No    Family history is noncontributory    Review of Systems  DATA OBTAINED: from nurse; no acute issues GENERAL:  no fevers, fatigue, appetite changes SKIN: No itching, rash or wounds EYES: No eye pain, redness, discharge EARS: No earache, tinnitus, change in hearing NOSE: No congestion, drainage or bleeding  MOUTH/THROAT: No mouth or tooth pain, No sore throat RESPIRATORY: No cough, wheezing, SOB CARDIAC: No chest pain, palpitations, lower extremity edema  GI: No abdominal pain, No N/V/D or constipation, No heartburn or reflux  GU: No dysuria, frequency or urgency, or incontinence  MUSCULOSKELETAL: No unrelieved bone/joint pain NEUROLOGIC: No headache, dizziness or focal weakness PSYCHIATRIC: No overt anxiety or sadness, No behavior issue.   Filed Vitals:   08/15/14 1904  BP: 124/63  Pulse: 62  Temp: 98.5 F (36.9 C)  Resp: 20    Physical Exam  GENERAL APPEARANCE: Alert, No acute distress BM, non verbal  SKIN: No diaphoresis rash HEAD: Normocephalic, atraumatic  EYES: Conjunctiva/lids clear. Pupils round, reactive. EOMs intact.  EARS: External exam WNL, canals clear. Hearing grossly normal.  NOSE: No deformity or discharge.  MOUTH/THROAT: Lips w/o lesions  RESPIRATORY: Breathing is even, unlabored. Lung sounds are clear   CARDIOVASCULAR: Heart RRR no murmurs, rubs or gallops. No peripheral edema.   GASTROINTESTINAL: Abdomen is soft, non-tender, not distended w/ normal bowel  sounds,PEG tube GENITOURINARY: Bladder non tender, not distended  MUSCULOSKELETAL: muscle wasting and contractures NEUROLOGIC:  Cranial nerves 2-12 grossly intact; quad PSYCHIATRIC: Mood and affect appropriate to situation, no behavioral issues  Patient Active Problem List   Diagnosis Date Noted  . Aspiration pneumonia 08/15/2014  . Abscess, prostate 08/15/2014  . Pyelonephritis 08/06/2014  . Nausea & vomiting   . Fever presenting with conditions classified elsewhere 03/16/2014  . Altered mental status 03/16/2014  . SIRS (systemic inflammatory response syndrome) 03/11/2014  . Acute bronchiolitis due to unspecified organism 03/11/2014  . Enteritis  due to Clostridium difficile 03/11/2014  . Type 2 diabetes mellitus without complication 03/11/2014  . Septic shock 01/26/2014  . Sepsis 01/25/2014  . Fecal impaction 01/04/2014  . Acute encephalopathy 01/02/2014  . Acute respiratory failure with hypercapnia 01/02/2014  . Chronic anemia 01/02/2014  . Subdural hematoma 01/02/2014  . Pain in thoracic spine 12/26/2013  . PEG (percutaneous endoscopic gastrostomy) status 10/04/2013  . Rash and nonspecific skin eruption 07/08/2013  . Thrush 07/08/2013  . Heme positive stool 07/07/2013  . Severe sepsis 06/23/2013  . E. coli pyelonephritis 06/22/2013  . Leukocytosis 06/19/2013  . Bronchitis 06/19/2013  . Dysphagia 06/19/2013  . Aphasia 06/19/2013  . Fever, unspecified 06/19/2013  . Other convulsions 08/10/2012  . Iron deficiency anemia 08/10/2012  . Glucocorticoid deficiency 08/10/2012  . Paralysis agitans 08/10/2012  . Nausea vomiting and diarrhea 06/15/2012  . Febrile illness, acute 06/15/2012  . UTI (lower urinary tract infection) 06/15/2012  . Hypokalemia 11/10/2011  . Diabetic gastroparesis 11/08/2011  . Ileus 11/08/2011  . Hyponatremia 11/08/2011  . Adrenal insufficiency 11/08/2011  . Quadriplegia 11/08/2011  . Parkinson's disease   . Seizures   . Anemia   . Depression      CBC    Component Value Date/Time   WBC 7.1 08/11/2014 0545   WBC 7.0 09/24/2013   RBC 4.32 08/11/2014 0545   HGB 9.9* 08/11/2014 0545   HCT 31.2* 08/11/2014 0545   PLT 175 08/11/2014 0545   MCV 72.2* 08/11/2014 0545   LYMPHSABS 0.8 08/07/2014 0545   MONOABS 0.7 08/07/2014 0545   EOSABS 0.0 08/07/2014 0545   BASOSABS 0.0 08/07/2014 0545    CMP     Component Value Date/Time   NA 139 08/13/2014 0551   NA 134* 09/24/2013   K 3.3* 08/13/2014 0551   CL 97* 08/13/2014 0551   CO2 31 08/13/2014 0551   GLUCOSE 98 08/13/2014 0551   BUN 10 08/13/2014 0551   BUN 18 09/24/2013   CREATININE 0.51* 08/13/2014 0551   CREATININE 0.5* 09/24/2013   CALCIUM 8.6* 08/13/2014 0551   PROT 7.0 08/10/2014 0530   ALBUMIN 2.6* 08/10/2014 0530   AST 30 08/10/2014 0530   ALT 25 08/10/2014 0530   ALKPHOS 85 08/10/2014 0530   BILITOT 0.4 08/10/2014 0530   GFRNONAA >60 08/13/2014 0551   GFRAA >60 08/13/2014 0551    Assessment and Plan  Aspiration pneumonia : Chest x-ray demonstrated right lower lobe infiltrate, given history of quadriplegia and known history of dysphagia-likely aspirating secretions, some suspicion for reflux of Peg feeds. Underwent FEES 08/11/14-mild oral and severe pharyngeal dysphagia with standing secretions throughout pharynx upon initial entry-recommendations are to remain nothing by mouth with oral nutrition, hydration and medications given via PEG tube.Clinically improved without any fever or leukocytosis, and blood cultures continue to be negative, will stop vancomycin and cefepime on 5/16 and transition him to Rocephin (see below)    Abscess, prostate : Urine culture positive for Escherichia coli and Proteus mirabilis-on IV cefepime and vancomycin since admission. This M.D. spoke with urology-Dr. Marlou Porch on 5/16, recommends at least 4 weeks of antibiotics, urology will arrange for follow-up in the next one month. I spoke with infectious disease-Dr. Daiva Eves over the phone  on 5/16 who reviewed chart, recommendations are to complete 4 weeks of IV antibiotics with Rocephin, and repeat a CT scan of the abdomen or a transrectal ultrasound to see if the abscess has resolved. Please remove PICC line once patient has completed IV antibiotics.   Enteritis due to  Clostridium difficile Unfortunately has had multiple episodes of C. difficile colitis in the past-now with a possible prostatic abscess and requiring prolonged antibiotics. Please watch closely for diarrhea.   Adrenal insufficiency : On admission because of sepsis placed on stress dose hydrocortisone, this was tapered, patient now back on usual regimen    Dysphagia : PEG tube in place. Resume PEG feedings-depending on FEES   Diabetic gastroparesis Reglan   Hypokalemia Repleted   Seizures Continue dilantin   Quadriplegia Unchanged for years     Margit Hanks, MD

## 2014-08-17 DIAGNOSIS — E2740 Unspecified adrenocortical insufficiency: Secondary | ICD-10-CM | POA: Diagnosis not present

## 2014-08-17 DIAGNOSIS — J690 Pneumonitis due to inhalation of food and vomit: Secondary | ICD-10-CM | POA: Diagnosis not present

## 2014-08-17 DIAGNOSIS — R1312 Dysphagia, oropharyngeal phase: Secondary | ICD-10-CM | POA: Diagnosis not present

## 2014-08-18 DIAGNOSIS — R1312 Dysphagia, oropharyngeal phase: Secondary | ICD-10-CM | POA: Diagnosis not present

## 2014-08-18 DIAGNOSIS — J690 Pneumonitis due to inhalation of food and vomit: Secondary | ICD-10-CM | POA: Diagnosis not present

## 2014-08-18 DIAGNOSIS — E2740 Unspecified adrenocortical insufficiency: Secondary | ICD-10-CM | POA: Diagnosis not present

## 2014-08-19 DIAGNOSIS — J690 Pneumonitis due to inhalation of food and vomit: Secondary | ICD-10-CM | POA: Diagnosis not present

## 2014-08-19 DIAGNOSIS — E2740 Unspecified adrenocortical insufficiency: Secondary | ICD-10-CM | POA: Diagnosis not present

## 2014-08-19 DIAGNOSIS — R1312 Dysphagia, oropharyngeal phase: Secondary | ICD-10-CM | POA: Diagnosis not present

## 2014-08-20 DIAGNOSIS — J690 Pneumonitis due to inhalation of food and vomit: Secondary | ICD-10-CM | POA: Diagnosis not present

## 2014-08-20 DIAGNOSIS — E2740 Unspecified adrenocortical insufficiency: Secondary | ICD-10-CM | POA: Diagnosis not present

## 2014-08-20 DIAGNOSIS — R1312 Dysphagia, oropharyngeal phase: Secondary | ICD-10-CM | POA: Diagnosis not present

## 2014-08-20 DIAGNOSIS — I10 Essential (primary) hypertension: Secondary | ICD-10-CM | POA: Diagnosis not present

## 2014-09-04 ENCOUNTER — Non-Acute Institutional Stay (SKILLED_NURSING_FACILITY): Payer: Medicare Other | Admitting: Internal Medicine

## 2014-09-04 DIAGNOSIS — G8250 Quadriplegia, unspecified: Secondary | ICD-10-CM

## 2014-09-04 DIAGNOSIS — J690 Pneumonitis due to inhalation of food and vomit: Secondary | ICD-10-CM

## 2014-09-04 DIAGNOSIS — N412 Abscess of prostate: Secondary | ICD-10-CM

## 2014-09-07 ENCOUNTER — Encounter: Payer: Self-pay | Admitting: Internal Medicine

## 2014-09-07 NOTE — Progress Notes (Signed)
Patient ID: [name], male   DOB: [birth year], 59 y.o.   MRN: [id number]   this is an acute visit.  Level of care skilled.  [facility]--SNF     Patient presents with  .  visit status post PICC line removal  --  History of aspiration pneumonia-- prostate abscess    HPI: Patient is 59 y.o. male with quadriplegia who gets septic and/or infected every 3-4 months who was hospitalized with suspected aspiration PNA and prostate abscess was found and urine grew out 2 different microbes admitted to SNF for 4 weeks antibiotics Appears he is completing that today-ironically apparently his PICC line accidentally came out during patient care-this afternoon  He has been afebrile per nursing staff he is back at his baseline.  Past Medical History  Diagnosis Date  . Quadriplegia   . Depression   . Anemia   . Parkinson's disease   . Seizures   . Adrenal insufficiency   . Dysphagia 06/19/2013  . Aphasia 06/19/2013    Past Surgical History  Procedure Laterality Date  . Peg tube placement    . Peripherally inserted central catheter insertion    . Tonsillectomy  1962      Medication List        list.              acetaminophen 325 MG tablet  Commonly known as: TYLENOL  Place 2 tablets (650 mg total) into feeding tube every 6 (six) hours as needed for fever (Give 2 tablets via g-tube every 6 hours as needed for a fever greater than 101 degrees F).     baclofen 10 MG tablet  Commonly known as: LIORESAL  Place 1 tablet (10 mg total) into feeding tube 3 (three) times daily.     bisacodyl 10 MG suppository  Commonly known as: DULCOLAX  Place 10 mg rectally daily as needed for moderate constipation. If constipation not relieved by milk of magnesia give one 10 mg suppository in 24hours     cefTRIAXone 40 MG/ML IVPB  Commonly known as: ROCEPHIN  Inject 50 mLs (2 g total) into the vein daily. For  4 weeks from 08/07/14.     CENTAMIN Liqd  5 mLs by PEG Tube route daily.     famotidine 20 MG tablet  Commonly known as: PEPCID  Place 1 tablet (20 mg total) into feeding tube 2 (two) times daily. Give 1 tablet via peg tube twice daily     feeding supplement (OSMOLITE 1.5 CAL) Liqd  Place 1,000 mLs into feeding tube daily. At 55 cc/hr     ferrous sulfate 220 (44 FE) MG/5ML solution  Place 330 mg into feeding tube See admin instructions. Give 7.77ml Per tube every morning     folic acid 1 MG tablet  Commonly known as: FOLVITE  1 mg by PEG Tube route daily.     free water Soln  Place 250 mLs into feeding tube every 4 (four) hours.     hydrocortisone 5 mg/mL Susp  Commonly known as: CORTEF  Place 5 mLs (25 mg total) into feeding tube daily at 6 (six) AM.     hydrocortisone 5 mg/mL Susp  Commonly known as: CORTEF  Place 3 mLs (15 mg total) into feeding tube every evening.     loratadine 10 MG tablet  Commonly known as: CLARITIN  10 mg by PEG Tube route daily.     metoCLOPramide 10 MG tablet  Commonly known as: REGLAN  Place 1 tablet (  10 mg total) into feeding tube 4 (four) times daily - before meals and at bedtime. Via Peg tube     olopatadine 0.1 % ophthalmic solution  Commonly known as: PATANOL  Place 1 drop into both eyes every morning.     phenytoin 125 MG/5ML suspension  Commonly known as: DILANTIN  Place 4 mLs (100 mg total) into feeding tube 2 (two) times daily.     polyethylene glycol packet  Commonly known as: MIRALAX / GLYCOLAX  Place 17 g into feeding tube daily.     polyvinyl alcohol 1.4 % ophthalmic solution  Commonly known as: LIQUIFILM TEARS  Place 1 drop into both eyes daily.     saccharomyces boulardii 250 MG capsule  Commonly known as: FLORASTOR  250 mg by PEG Tube route.        No orders of the defined types were placed in this  encounter.   Immunization History  Administered Date(s) Administered  . Influenza Whole 12/31/2012  . Influenza-Unspecified 01/07/2014  . PPD Test 08/22/2008  . Pneumococcal-Unspecified 06/23/2010    History  Substance Use Topics  . Smoking status: Never Smoker   . Smokeless tobacco: Never Used  . Alcohol Use: No    Family history is noncontributory   Review of Systems  DATA OBTAINED: from nurse; no acute issues GENERAL: no fevers, fatigue, appetite changes SKIN: No itching, rash or wounds EYES: No eye pain, redness, discharge EARS: No earache, tinnitus, change in hearing NOSE: No congestion, drainage or bleeding  MOUTH/THROAT: No mouth or tooth pain, No sore throat RESPIRATORY: No cough, wheezing, SOB CARDIAC: No chest pain, palpitations, lower extremity edema  GI: No abdominal pain, No N/V/D or constipation, No heartburn or reflux  GU: No dysuria, frequency or urgency, or incontinence  MUSCULOSKELETAL: No unrelieved bone/joint pain NEUROLOGIC: No headache, dizziness or focal weakness PSYCHIATRIC: No overt anxiety or sadness, No behavior issue.                       Physical Exam  temperature 96.8 pulse 75 respirations 22 blood pressure 98/62-119/72  GENERAL APPEARANCE: Alert, No acute distress BM, non verbal  SKIN: No diaphoresis rash--previous PICC line site appears unremarkable on the upper left arm there is no drainage bleeding PICC tip is noted on removed line HEAD: Normocephalic, atraumatic  EYES: Conjunctiva/lids clear. Pupils round, reactive. EOMs intact.  EARS: External exam WNL, canals clear. Hearing grossly normal.  NOSE: No deformity or discharge.  MOUTH/THROAT: Lips w/o lesions  RESPIRATORY: Breathing is even, unlabored. Lung sounds are clear  CARDIOVASCULAR: Heart RRR no murmurs, rubs or gallops. No peripheral edema.  GASTROINTESTINAL: Abdomen is soft, non-tender, not distended w/ normal bowel  sounds,PEG tube GENITOURINARY: Bladder non tender, not distended  MUSCULOSKELETAL: muscle wasting and contractures NEUROLOGIC: Cranial nerves 2-12 grossly intact; quad--at baseline PSYCHIATRIC: Mood and affect appropriate to situation, no behavioral issues--is following verbal   Patient Active Problem List   Diagnosis Date Noted  . Aspiration pneumonia 08/15/2014  . Abscess, prostate 08/15/2014  . Pyelonephritis 08/06/2014  . Nausea & vomiting   . Fever presenting with conditions classified elsewhere 03/16/2014  . Altered mental status 03/16/2014  . SIRS (systemic inflammatory response syndrome) 03/11/2014  . Acute bronchiolitis due to unspecified organism 03/11/2014  . Enteritis due to Clostridium difficile 03/11/2014  . Type 2 diabetes mellitus without complication 03/11/2014  . Septic shock 01/26/2014  . Sepsis 01/25/2014  . Fecal impaction 01/04/2014  . Acute encephalopathy 01/02/2014  . Acute respiratory  failure with hypercapnia 01/02/2014  . Chronic anemia 01/02/2014  . Subdural hematoma 01/02/2014  . Pain in thoracic spine 12/26/2013  . PEG (percutaneous endoscopic gastrostomy) status 10/04/2013  . Rash and nonspecific skin eruption 07/08/2013  . Thrush 07/08/2013  . Heme positive stool 07/07/2013  . Severe sepsis 06/23/2013  . E. coli pyelonephritis 06/22/2013  . Leukocytosis 06/19/2013  . Bronchitis 06/19/2013  . Dysphagia 06/19/2013  . Aphasia 06/19/2013  . Fever, unspecified 06/19/2013  . Other convulsions 08/10/2012  . Iron deficiency anemia 08/10/2012  . Glucocorticoid deficiency 08/10/2012  . Paralysis agitans 08/10/2012  . Nausea vomiting and diarrhea 06/15/2012  . Febrile illness, acute 06/15/2012  . UTI (lower urinary tract infection) 06/15/2012  . Hypokalemia 11/10/2011  . Diabetic gastroparesis 11/08/2011  . Ileus 11/08/2011  .  Hyponatremia 11/08/2011  . Adrenal insufficiency 11/08/2011  . Quadriplegia 11/08/2011  . Parkinson's disease   . Seizures   . Anemia   . Depression     labs.  08/20/2014.  Sodium 133 potassium 4.2 BUN 17 creatinine 0.5  CBC  Labs (Brief)       Component Value Date/Time   WBC 7.1 08/11/2014 0545   WBC 7.0 09/24/2013   RBC 4.32 08/11/2014 0545   HGB 9.9* 08/11/2014 0545   HCT 31.2* 08/11/2014 0545   PLT 175 08/11/2014 0545   MCV 72.2* 08/11/2014 0545   LYMPHSABS 0.8 08/07/2014 0545   MONOABS 0.7 08/07/2014 0545   EOSABS 0.0 08/07/2014 0545   BASOSABS 0.0 08/07/2014 0545      CMP  Labs (Brief)       Component Value Date/Time   NA 139 08/13/2014 0551   NA 134* 09/24/2013   K 3.3* 08/13/2014 0551   CL 97* 08/13/2014 0551   CO2 31 08/13/2014 0551   GLUCOSE 98 08/13/2014 0551   BUN 10 08/13/2014 0551   BUN 18 09/24/2013   CREATININE 0.51* 08/13/2014 0551   CREATININE 0.5* 09/24/2013   CALCIUM 8.6* 08/13/2014 0551   PROT 7.0 08/10/2014 0530   ALBUMIN 2.6* 08/10/2014 0530   AST 30 08/10/2014 0530   ALT 25 08/10/2014 0530   ALKPHOS 85 08/10/2014 0530   BILITOT 0.4 08/10/2014 0530   GFRNONAA >60 08/13/2014 0551   GFRAA >60 08/13/2014 0551      Assessment and Plan  Aspiration pneumonia : Chest x-ray demonstrated right lower lobe infiltrate, given history of quadriplegia and known history of dysphagia-likely aspirating secretions, some suspicion for reflux of Peg feeds. Underwent FEES 08/11/14-mild oral and severe pharyngeal dysphagia with standing secretions throughout pharynx upon initial entry-recommendations are to remain nothing by mouth with oral nutrition, hydration and medications given via PEG tube.Clinically improved without any fever or leukocytosis, and blood cultures continue to be negative, in hospital  vancomycin and cefepime on 5/16 and transition him to Rocephin for a 4 week course which she appears to have completed today-again PICC line has come out   -will have staff contact infectious disease to make sure there is follow-up arranged-from what I can see from notes there was a possibility of doing a repeat of his abdomen CT-o urology to consider an trans urethral US--secondary to number 2 noted below    Abscess, prostate : Urine culture positive for Escherichia coli and Proteus mirabilis-on IV cefepime and vancomycin since admission. T M.D. spoke with urology-Dr. Marlou Porch on 5/16, recommends at least 4 weeks of antibiotics, urology will arrange for follow-up in the next one month.--consultation with infectious disease-Dr. Daiva Eves  on 5/16 who  reviewed chart, recommendations are to complete 4 weeks of IV antibiotics with Rocephin, and repeat a CT scan of the abdomen or a transrectal ultrasound to see if the abscess has resolved..   Enteritis due to Clostridium difficile Unfortunately has had multiple episodes of C. difficile colitis in the past-now with a possible prostatic abscess and requiring prolonged antibiotics. Apparently diarrhea has not been an issue since his return to skilled nursing.   Adrenal insufficiency : On admission because of sepsis placed on stress dose hydrocortisone, this was tapered, patient now back on usual regimen    Dysphagia : PEG tube in place.     Diabetic gastroparesis Reglan   Hypokalemia Repleted   Seizures Continue dilantin   Quadriplegia Unchanged for years   Again it appears has completed a four week course   of Rocephin although infectious disease and urology will have to be involved in this for any possible further treatment.  Also will update a CBC and metabolic panel tomorrow for updated values.  Clinically he appears to be stable and at his baseline.  [id number] note greater than 30 minutes spent assessing patient-discussing  his status with nursing staff-reviewing his chart-and coordinating plan of care- Of note greater than 50% of time spent coordinating plan of care

## 2014-09-09 DIAGNOSIS — N412 Abscess of prostate: Secondary | ICD-10-CM | POA: Diagnosis not present

## 2014-09-10 ENCOUNTER — Non-Acute Institutional Stay (SKILLED_NURSING_FACILITY): Payer: Medicare Other | Admitting: Internal Medicine

## 2014-09-10 ENCOUNTER — Encounter: Payer: Self-pay | Admitting: Internal Medicine

## 2014-09-10 DIAGNOSIS — B356 Tinea cruris: Secondary | ICD-10-CM

## 2014-09-10 DIAGNOSIS — R21 Rash and other nonspecific skin eruption: Secondary | ICD-10-CM

## 2014-09-10 DIAGNOSIS — B360 Pityriasis versicolor: Secondary | ICD-10-CM

## 2014-09-10 NOTE — Assessment & Plan Note (Signed)
Start with antifungal cream but since pt has another type of rash on back willl start diflucan 100 mg BID for 2 weeks to treat both.

## 2014-09-10 NOTE — Assessment & Plan Note (Signed)
From low back to shoulders. Drug rash possible,but it looks fungal, although not typical for candida or tinea versicolor. Since pts GU rash is so large an area will treat with diflucan for 14 days and monitor

## 2014-09-10 NOTE — Progress Notes (Signed)
MRN: [id number] Name: [name]  Sex: male Age: 59 y.o. DOB: [birth year]  [facility] #: [name] Facility/Room:214 Level Of Care: SNF Provider: Merrilee Seashore D Emergency Contacts: Extended Emergency Contact Information Primary Emergency Contact: Pettinato,[name] Address: [address]          [place], [place] [address] [place] Home Phone: [phone] Mobile Phone: [phone] Relation: Mother Secondary Emergency Contact: Valinda Party [place] Home Phone: [phone] Relation: Brother Father: Rush, Brust [place] Mobile Phone: [phone]  Code Status: FULL  Allergies: Aspirin; Nsaids; and Penicillins  Chief Complaint  Patient presents with  . Acute Visit    HPI: Patient is 59 y.o. male who the wound nurse has asked me to see for a rash in GU area and on back.  Past Medical History  Diagnosis Date  . Quadriplegia   . Depression   . Anemia   . Parkinson's disease   . Seizures   . Adrenal insufficiency   . Dysphagia 06/19/2013  . Aphasia 06/19/2013    Past Surgical History  Procedure Laterality Date  . Peg tube placement    . Peripherally inserted central catheter insertion    . Tonsillectomy  1962      Medication List       This list is accurate as of: 09/10/14  1:54 PM.  Always use your most recent med list.               acetaminophen 325 MG tablet  Commonly known as:  TYLENOL  Place 2 tablets (650 mg total) into feeding tube every 6 (six) hours as needed for fever (Give 2 tablets via g-tube every 6 hours as needed for a fever greater than 101 degrees F).     baclofen 10 MG tablet  Commonly known as:  LIORESAL  Place 1 tablet (10 mg total) into feeding tube 3 (three) times daily.     bisacodyl 10 MG suppository  Commonly known as:  DULCOLAX  Place 10 mg rectally daily as needed for moderate constipation. If constipation not relieved by milk of magnesia give one 10 mg suppository in  24hours     cefTRIAXone 40 MG/ML IVPB  Commonly known as:  ROCEPHIN  Inject 50 mLs (2 g total) into the vein daily. For 4 weeks from 08/07/14.     CENTAMIN Liqd  5 mLs by PEG Tube route daily.     famotidine 20 MG tablet  Commonly known as:  PEPCID  Place 1 tablet (20 mg total) into feeding tube 2 (two) times daily. Give 1 tablet via peg tube twice daily     feeding supplement (OSMOLITE 1.5 CAL) Liqd  Place 1,000 mLs into feeding tube daily. At 55 cc/hr     ferrous sulfate 220 (44 FE) MG/5ML solution  Place 330 mg into feeding tube See admin instructions. Give 7.34ml  Per tube every morning     folic acid 1 MG tablet  Commonly known as:  FOLVITE  1 mg by PEG Tube route daily.     free water Soln  Place 250 mLs into feeding tube every 4 (four) hours.     hydrocortisone 5 mg/mL Susp  Commonly known as:  CORTEF  Place 5 mLs (25 mg total) into feeding tube daily at 6 (six) AM.     hydrocortisone 5 mg/mL Susp  Commonly known as:  CORTEF  Place 3 mLs (15 mg total) into feeding tube every evening.  loratadine 10 MG tablet  Commonly known as:  CLARITIN  10 mg by PEG Tube route daily.     metoCLOPramide 10 MG tablet  Commonly known as:  REGLAN  Place 1 tablet (10 mg total) into feeding tube 4 (four) times daily -  before meals and at bedtime. Via Peg tube     olopatadine 0.1 % ophthalmic solution  Commonly known as:  PATANOL  Place 1 drop into both eyes every morning.     phenytoin 125 MG/5ML suspension  Commonly known as:  DILANTIN  Place 4 mLs (100 mg total) into feeding tube 2 (two) times daily.     polyethylene glycol packet  Commonly known as:  MIRALAX / GLYCOLAX  Place 17 g into feeding tube daily.     polyvinyl alcohol 1.4 % ophthalmic solution  Commonly known as:  LIQUIFILM TEARS  Place 1 drop into both eyes daily.     saccharomyces boulardii 250 MG capsule  Commonly known as:  FLORASTOR  250 mg by PEG Tube route.        No orders of the defined types  were placed in this encounter.    Immunization History  Administered Date(s) Administered  . Influenza Whole 12/31/2012  . Influenza-Unspecified 01/07/2014  . PPD Test 08/22/2008  . Pneumococcal-Unspecified 06/23/2010    History  Substance Use Topics  . Smoking status: Never Smoker   . Smokeless tobacco: Never Used  . Alcohol Use: No    Review of Systems  DATA OBTAINED: from patient, nurse GENERAL:  no fevers, fatigue, appetite changes SKIN:  Rash on back is not itchy or hurts but rash on bottom hurts HEENT: No complaint RESPIRATORY: No cough, wheezing, SOB CARDIAC: No chest pain, palpitations, lower extremity edema  GI: No abdominal pain, No N/V/D or constipation, No heartburn or reflux  GU: No dysuria, frequency or urgency, or incontinence  MUSCULOSKELETAL: No unrelieved bone/joint pain NEUROLOGIC: No headache, dizziness  PSYCHIATRIC: No overt anxiety or sadness  Filed Vitals:   09/10/14 1329  BP: 125/70  Pulse: 70  Temp: 98.3 F (36.8 C)  Resp: 18    Physical Exam  GENERAL APPEARANCE: Alert, BM No acute distress  SKIN: upper thighs, buttock, scrotum, penis - all involved with scaley brown rash with borders c/w diaper dermatitis; back - round and oval minimally raised in lesions diffusely over back but nowhere else; no swelling redness or infection look to it HEENT: Unremarkable RESPIRATORY: Breathing is even, unlabored. Lung sounds are clear   CARDIOVASCULAR: Heart RRR no murmurs, rubs or gallops. No peripheral edema  GASTROINTESTINAL: Abdomen is soft, non-tender, not distended w/ normal bowel sounds.  GENITOURINARY: Bladder non tender, not distended  MUSCULOSKELETAL: contractures NEUROLOGIC: quadriplegic, aphasic PSYCHIATRIC: pleasant  Patient Active Problem List   Diagnosis Date Noted  . Tinea cruris 09/10/2014  . Aspiration pneumonia 08/15/2014  . Abscess, prostate 08/15/2014  . Pyelonephritis 08/06/2014  . Nausea & vomiting   . Fever presenting with  conditions classified elsewhere 03/16/2014  . Altered mental status 03/16/2014  . Acute bronchiolitis due to unspecified organism 03/11/2014  . Enteritis due to Clostridium difficile 03/11/2014  . Type 2 diabetes mellitus without complication 03/11/2014  . Septic shock 01/26/2014  . Sepsis 01/25/2014  . Acute encephalopathy 01/02/2014  . Acute respiratory failure with hypercapnia 01/02/2014  . Chronic anemia 01/02/2014  . Subdural hematoma 01/02/2014  . Pain in thoracic spine 12/26/2013  . PEG (percutaneous endoscopic gastrostomy) status 10/04/2013  . Rash and nonspecific skin eruption 07/08/2013  .  Thrush 07/08/2013  . Heme positive stool 07/07/2013  . Severe sepsis 06/23/2013  . E. coli pyelonephritis 06/22/2013  . SIRS (systemic inflammatory response syndrome) 06/19/2013  . Leukocytosis 06/19/2013  . Bronchitis 06/19/2013  . Dysphagia 06/19/2013  . Aphasia 06/19/2013  . Other convulsions 08/10/2012  . Iron deficiency anemia 08/10/2012  . Glucocorticoid deficiency 08/10/2012  . Febrile illness, acute 06/15/2012  . UTI (lower urinary tract infection) 06/15/2012  . Hypokalemia 11/10/2011  . Diabetic gastroparesis 11/08/2011  . Ileus 11/08/2011  . Hyponatremia 11/08/2011  . Adrenal insufficiency 11/08/2011  . Quadriplegia 11/08/2011  . Parkinson's disease   . Seizures   . Depression     CBC    Component Value Date/Time   WBC 7.1 08/11/2014 0545   WBC 7.0 09/24/2013   RBC 4.32 08/11/2014 0545   HGB 9.9* 08/11/2014 0545   HCT 31.2* 08/11/2014 0545   PLT 175 08/11/2014 0545   MCV 72.2* 08/11/2014 0545   LYMPHSABS 0.8 08/07/2014 0545   MONOABS 0.7 08/07/2014 0545   EOSABS 0.0 08/07/2014 0545   BASOSABS 0.0 08/07/2014 0545    CMP     Component Value Date/Time   NA 139 08/13/2014 0551   NA 134* 09/24/2013   K 3.3* 08/13/2014 0551   CL 97* 08/13/2014 0551   CO2 31 08/13/2014 0551   GLUCOSE 98 08/13/2014 0551   BUN 10 08/13/2014 0551   BUN 18 09/24/2013    CREATININE 0.51* 08/13/2014 0551   CREATININE 0.5* 09/24/2013   CALCIUM 8.6* 08/13/2014 0551   PROT 7.0 08/10/2014 0530   ALBUMIN 2.6* 08/10/2014 0530   AST 30 08/10/2014 0530   ALT 25 08/10/2014 0530   ALKPHOS 85 08/10/2014 0530   BILITOT 0.4 08/10/2014 0530   GFRNONAA >60 08/13/2014 0551   GFRAA >60 08/13/2014 0551    Assessment and Plan  Tinea cruris Start with antifungal cream but since pt has another type of rash on back willl start diflucan 100 mg BID for 2 weeks to treat both.  Rash and nonspecific skin eruption From low back to shoulders. Drug rash possible,but it looks fungal, although not typical for candida or tinea versicolor. Since pts GU rash is so large an area will treat with diflucan for 14 days and monitor    Margit Hanks, MD

## 2014-10-21 DIAGNOSIS — B351 Tinea unguium: Secondary | ICD-10-CM | POA: Diagnosis not present

## 2014-10-21 DIAGNOSIS — M79671 Pain in right foot: Secondary | ICD-10-CM | POA: Diagnosis not present

## 2014-10-21 DIAGNOSIS — M79672 Pain in left foot: Secondary | ICD-10-CM | POA: Diagnosis not present

## 2014-11-26 ENCOUNTER — Non-Acute Institutional Stay (SKILLED_NURSING_FACILITY): Payer: Medicare Other | Admitting: Internal Medicine

## 2014-11-26 ENCOUNTER — Emergency Department (HOSPITAL_COMMUNITY): Payer: Medicare Other

## 2014-11-26 ENCOUNTER — Encounter (HOSPITAL_COMMUNITY): Payer: Self-pay | Admitting: *Deleted

## 2014-11-26 ENCOUNTER — Emergency Department (HOSPITAL_COMMUNITY)
Admission: EM | Admit: 2014-11-26 | Discharge: 2014-11-26 | Disposition: A | Discharge status: Home / Self Care | Payer: Medicare Other | Attending: Emergency Medicine | Admitting: Emergency Medicine

## 2014-11-26 DIAGNOSIS — D649 Anemia, unspecified: Secondary | ICD-10-CM | POA: Diagnosis not present

## 2014-11-26 DIAGNOSIS — N390 Urinary tract infection, site not specified: Secondary | ICD-10-CM

## 2014-11-26 DIAGNOSIS — R1110 Vomiting, unspecified: Secondary | ICD-10-CM

## 2014-11-26 DIAGNOSIS — R042 Hemoptysis: Secondary | ICD-10-CM | POA: Diagnosis present

## 2014-11-26 DIAGNOSIS — Z79899 Other long term (current) drug therapy: Secondary | ICD-10-CM | POA: Diagnosis not present

## 2014-11-26 DIAGNOSIS — Z8659 Personal history of other mental and behavioral disorders: Secondary | ICD-10-CM | POA: Diagnosis not present

## 2014-11-26 DIAGNOSIS — G40909 Epilepsy, unspecified, not intractable, without status epilepticus: Secondary | ICD-10-CM | POA: Insufficient documentation

## 2014-11-26 DIAGNOSIS — R1012 Left upper quadrant pain: Secondary | ICD-10-CM | POA: Diagnosis not present

## 2014-11-26 DIAGNOSIS — R21 Rash and other nonspecific skin eruption: Secondary | ICD-10-CM | POA: Diagnosis not present

## 2014-11-26 DIAGNOSIS — Z880 Allergy status to penicillin: Secondary | ICD-10-CM | POA: Diagnosis not present

## 2014-11-26 DIAGNOSIS — Z8639 Personal history of other endocrine, nutritional and metabolic disease: Secondary | ICD-10-CM | POA: Insufficient documentation

## 2014-11-26 DIAGNOSIS — B356 Tinea cruris: Secondary | ICD-10-CM

## 2014-11-26 DIAGNOSIS — Z7952 Long term (current) use of systemic steroids: Secondary | ICD-10-CM | POA: Diagnosis not present

## 2014-11-26 DIAGNOSIS — N412 Abscess of prostate: Secondary | ICD-10-CM | POA: Diagnosis not present

## 2014-11-26 DIAGNOSIS — R140 Abdominal distension (gaseous): Secondary | ICD-10-CM | POA: Diagnosis not present

## 2014-11-26 DIAGNOSIS — Z4682 Encounter for fitting and adjustment of non-vascular catheter: Secondary | ICD-10-CM | POA: Diagnosis not present

## 2014-11-26 DIAGNOSIS — K529 Noninfective gastroenteritis and colitis, unspecified: Secondary | ICD-10-CM | POA: Diagnosis not present

## 2014-11-26 DIAGNOSIS — K920 Hematemesis: Secondary | ICD-10-CM | POA: Diagnosis not present

## 2014-11-26 DIAGNOSIS — K6389 Other specified diseases of intestine: Secondary | ICD-10-CM | POA: Diagnosis not present

## 2014-11-26 DIAGNOSIS — R197 Diarrhea, unspecified: Secondary | ICD-10-CM | POA: Diagnosis not present

## 2014-11-26 LAB — CBC WITH DIFFERENTIAL/PLATELET
Basophils Absolute: 0 10*3/uL (ref 0.0–0.1)
Basophils Relative: 0 % (ref 0–1)
EOS PCT: 7 % — AB (ref 0–5)
Eosinophils Absolute: 0.4 10*3/uL (ref 0.0–0.7)
HEMATOCRIT: 31.5 % — AB (ref 39.0–52.0)
Hemoglobin: 10.2 g/dL — ABNORMAL LOW (ref 13.0–17.0)
Lymphocytes Relative: 35 % (ref 12–46)
Lymphs Abs: 2.2 10*3/uL (ref 0.7–4.0)
MCH: 23.6 pg — ABNORMAL LOW (ref 26.0–34.0)
MCHC: 32.4 g/dL (ref 30.0–36.0)
MCV: 72.7 fL — ABNORMAL LOW (ref 78.0–100.0)
MONO ABS: 0.8 10*3/uL (ref 0.1–1.0)
MONOS PCT: 12 % (ref 3–12)
NEUTROS ABS: 2.9 10*3/uL (ref 1.7–7.7)
Neutrophils Relative %: 46 % (ref 43–77)
Platelets: 295 10*3/uL (ref 150–400)
RBC: 4.33 MIL/uL (ref 4.22–5.81)
RDW: 13.9 % (ref 11.5–15.5)
WBC: 6.3 10*3/uL (ref 4.0–10.5)

## 2014-11-26 LAB — TYPE AND SCREEN
ABO/RH(D): O NEG
ANTIBODY SCREEN: NEGATIVE

## 2014-11-26 LAB — URINALYSIS, ROUTINE W REFLEX MICROSCOPIC
BILIRUBIN URINE: NEGATIVE
Glucose, UA: NEGATIVE mg/dL
KETONES UR: NEGATIVE mg/dL
NITRITE: NEGATIVE
Protein, ur: NEGATIVE mg/dL
SPECIFIC GRAVITY, URINE: 1.024 (ref 1.005–1.030)
Urobilinogen, UA: 0.2 mg/dL (ref 0.0–1.0)
pH: 6.5 (ref 5.0–8.0)

## 2014-11-26 LAB — I-STAT CG4 LACTIC ACID, ED
Lactic Acid, Venous: 0.88 mmol/L (ref 0.5–2.0)
Lactic Acid, Venous: 1.19 mmol/L (ref 0.5–2.0)

## 2014-11-26 LAB — APTT: APTT: 37 s (ref 24–37)

## 2014-11-26 LAB — COMPREHENSIVE METABOLIC PANEL
ALK PHOS: 153 U/L — AB (ref 38–126)
ALT: 12 U/L — AB (ref 17–63)
ANION GAP: 8 (ref 5–15)
AST: 25 U/L (ref 15–41)
Albumin: 3 g/dL — ABNORMAL LOW (ref 3.5–5.0)
BUN: 11 mg/dL (ref 6–20)
CALCIUM: 8.5 mg/dL — AB (ref 8.9–10.3)
CO2: 27 mmol/L (ref 22–32)
CREATININE: 0.53 mg/dL — AB (ref 0.61–1.24)
Chloride: 96 mmol/L — ABNORMAL LOW (ref 101–111)
GFR calc Af Amer: >60 mL/min (ref >60)
GFR calc non Af Amer: >60 mL/min (ref >60)
GLUCOSE: 93 mg/dL (ref 65–99)
Potassium: 3.8 mmol/L (ref 3.5–5.1)
Sodium: 131 mmol/L — ABNORMAL LOW (ref 135–145)
TOTAL PROTEIN: 7.7 g/dL (ref 6.5–8.1)

## 2014-11-26 LAB — URINE MICROSCOPIC-ADD ON

## 2014-11-26 LAB — LIPASE, BLOOD: Lipase: 26 U/L (ref 22–51)

## 2014-11-26 LAB — PROTIME-INR
INR: 1.24 (ref 0.00–1.49)
Prothrombin Time: 15.8 seconds — ABNORMAL HIGH (ref 11.6–15.2)

## 2014-11-26 LAB — POC OCCULT BLOOD, ED: FECAL OCCULT BLD: NEGATIVE

## 2014-11-26 MED ORDER — CEPHALEXIN 500 MG PO CAPS: 500.0000 mg | ORAL_CAPSULE | Freq: Two times a day (BID) | ORAL | Status: DC

## 2014-11-26 MED ORDER — ONDANSETRON HCL 4 MG/2ML IJ SOLN
4.0000 mg | Freq: Once | INTRAMUSCULAR | Status: AC
  Administered 2014-11-26: 4 mg via INTRAVENOUS
  Filled 2014-11-26: qty 2

## 2014-11-26 MED ORDER — DEXTROSE 5 % IV SOLN
1.0000 g | Freq: Once | INTRAVENOUS | Status: AC
  Administered 2014-11-26: 1 g via INTRAVENOUS
  Filled 2014-11-26: qty 10

## 2014-11-26 MED ORDER — MORPHINE SULFATE (PF) 4 MG/ML IV SOLN
4.0000 mg | Freq: Once | INTRAVENOUS | Status: AC
  Administered 2014-11-26: 4 mg via INTRAVENOUS
  Filled 2014-11-26: qty 1

## 2014-11-26 MED ORDER — SODIUM CHLORIDE 0.9 % IV BOLUS (SEPSIS)
500.0000 mL | Freq: Once | INTRAVENOUS | Status: AC
  Administered 2014-11-26: 500 mL via INTRAVENOUS

## 2014-11-26 MED ORDER — IOHEXOL 300 MG/ML  SOLN
80.0000 mL | Freq: Once | INTRAMUSCULAR | Status: AC | PRN
  Administered 2014-11-26: 100 mL via INTRAVENOUS

## 2014-11-26 NOTE — Discharge Instructions (Signed)
Follow-up with your primary physician.  Diarrhea Diarrhea is frequent loose and watery bowel movements. It can cause you to feel weak and dehydrated. Dehydration can cause you to become tired and thirsty, have a dry mouth, and have decreased urination that often is dark yellow. Diarrhea is a sign of another problem, most often an infection that will not last long. In most cases, diarrhea typically lasts 2-3 days. However, it can last longer if it is a sign of something more serious. It is important to treat your diarrhea as directed by your caregiver to lessen or prevent future episodes of diarrhea. CAUSES  Some common causes include:  Gastrointestinal infections caused by viruses, bacteria, or parasites.  Food poisoning or food allergies.  Certain medicines, such as antibiotics, chemotherapy, and laxatives.  Artificial sweeteners and fructose.  Digestive disorders. HOME CARE INSTRUCTIONS  Ensure adequate fluid intake (hydration): Have 1 cup (8 oz) of fluid for each diarrhea episode. Avoid fluids that contain simple sugars or sports drinks, fruit juices, whole milk products, and sodas. Your urine should be clear or pale yellow if you are drinking enough fluids. Hydrate with an oral rehydration solution that you can purchase at pharmacies, retail stores, and online. You can prepare an oral rehydration solution at home by mixing the following ingredients together:   - tsp table salt.   tsp baking soda.   tsp salt substitute containing potassium chloride.  1  tablespoons sugar.  1 L (34 oz) of water.  Certain foods and beverages may increase the speed at which food moves through the gastrointestinal (GI) tract. These foods and beverages should be avoided and include:  Caffeinated and alcoholic beverages.  High-fiber foods, such as raw fruits and vegetables, nuts, seeds, and whole grain breads and cereals.  Foods and beverages sweetened with sugar alcohols, such as xylitol, sorbitol,  and mannitol.  Some foods may be well tolerated and may help thicken stool including:  Starchy foods, such as rice, toast, pasta, low-sugar cereal, oatmeal, grits, baked potatoes, crackers, and bagels.  Bananas.  Applesauce.  Add probiotic-rich foods to help increase healthy bacteria in the GI tract, such as yogurt and fermented milk products.  Wash your hands well after each diarrhea episode.  Only take over-the-counter or prescription medicines as directed by your caregiver.  Take a warm bath to relieve any burning or pain from frequent diarrhea episodes. SEEK IMMEDIATE MEDICAL CARE IF:   You are unable to keep fluids down.  You have persistent vomiting.  You have blood in your stool, or your stools are black and tarry.  You do not urinate in 6-8 hours, or there is only a small amount of very dark urine.  You have abdominal pain that increases or localizes.  You have weakness, dizziness, confusion, or light-headedness.  You have a severe headache.  Your diarrhea gets worse or does not get better.  You have a fever or persistent symptoms for more than 2-3 days.  You have a fever and your symptoms suddenly get worse. MAKE SURE YOU:   Understand these instructions.  Will watch your condition.  Will get help right away if you are not doing well or get worse. Document Released: 03/04/2002 Document Revised: 07/29/2013 Document Reviewed: 11/20/2011 [facility] Patient Information 2015 [place], [place]. This information is not intended to replace advice given to you by your health care provider. Make sure you discuss any questions you have with your health care provider.

## 2014-11-26 NOTE — ED Notes (Signed)
Report called to SNF [facility]

## 2014-11-26 NOTE — ED Notes (Signed)
[facility] called to transport pt to SNF

## 2014-11-26 NOTE — ED Provider Notes (Signed)
CSN: [id number]     Arrival date & time 11/26/14  5284 History   First MD Initiated Contact with Patient 11/26/14 0425     Chief Complaint  Patient presents with  . Emesis     (Consider location/radiation/quality/duration/timing/severity/associated sxs/prior Treatment) HPI Patient with history of quadriplegia and akathisia presents from [facility] from nursing home after 1 episode of bloody emesis. Per nursing home staff patient has been moaning all day. Level V caveat applies. Past Medical History  Diagnosis Date  . Quadriplegia   . Depression   . Anemia   . Parkinson's disease   . Seizures   . Adrenal insufficiency   . Dysphagia 06/19/2013  . Aphasia 06/19/2013   Past Surgical History  Procedure Laterality Date  . Peg tube placement    . Peripherally inserted central catheter insertion    . Tonsillectomy  1962   Family History  Problem Relation Age of Onset  . Hypothyroidism Mother   . Hypertension Mother   . Diabetes Father    Social History  Substance Use Topics  . Smoking status: Never Smoker   . Smokeless tobacco: Never Used  . Alcohol Use: No    Review of Systems  Unable to perform ROS: Patient nonverbal      Allergies  Aspirin; Nsaids; and Penicillins  Home Medications   Prior to Admission medications   Medication Sig Start Date End Date Taking? Authorizing Provider  acetaminophen (TYLENOL) 325 MG tablet Place 2 tablets (650 mg total) into feeding tube every 6 (six) hours as needed for fever (Give 2 tablets via g-tube every 6 hours as needed for a fever greater than 101 degrees F). 08/12/14  Yes Shanker Kristeen Mans, MD  baclofen (LIORESAL) 10 MG tablet Place 1 tablet (10 mg total) into feeding tube 3 (three) times daily. 08/12/14  Yes Shanker Kristeen Mans, MD  bisacodyl (DULCOLAX) 10 MG suppository Place 10 mg rectally daily as needed for moderate constipation. If constipation not relieved by milk of magnesia give one 10 mg suppository in 24hours   Yes Historical  Provider, MD  famotidine (PEPCID) 20 MG tablet Place 1 tablet (20 mg total) into feeding tube 2 (two) times daily. Give 1 tablet via peg tube twice daily 08/12/14  Yes Shanker Kristeen Mans, MD  ferrous sulfate 220 (44 FE) MG/5ML solution Place 330 mg into feeding tube See admin instructions. Give 7.70ml  Per tube every morning   Yes Historical Provider, MD  folic acid (FOLVITE) 1 MG tablet 1 mg by PEG Tube route daily.   Yes Historical Provider, MD  loratadine (CLARITIN) 10 MG tablet 10 mg by PEG Tube route daily.   Yes Historical Provider, MD  magnesium hydroxide (MILK OF MAGNESIA) 400 MG/5ML suspension Take 30 mLs by mouth daily as needed for mild constipation.   Yes Historical Provider, MD  metoCLOPramide (REGLAN) 10 MG tablet Place 1 tablet (10 mg total) into feeding tube 4 (four) times daily -  before meals and at bedtime. Via Peg tube 08/12/14  Yes Shanker Kristeen Mans, MD  Multiple Vitamins-Minerals (CENTAMIN) LIQD 5 mLs by PEG Tube route daily.    Yes Historical Provider, MD  olopatadine (PATANOL) 0.1 % ophthalmic solution Place 1 drop into both eyes every morning. Patient taking differently: Place 1 drop into both eyes every morning.  01/28/14  Yes Albertine Patricia, MD  phenytoin (DILANTIN) 125 MG/5ML suspension Place 4 mLs (100 mg total) into feeding tube 2 (two) times daily. 03/13/14  Yes Shanker Kristeen Mans, MD  polyethylene glycol (MIRALAX / GLYCOLAX) packet Place 17 g into feeding tube daily. 08/12/14  Yes Shanker Kristeen Mans, MD  polyvinyl alcohol (LIQUIFILM TEARS) 1.4 % ophthalmic solution Place 1 drop into both eyes daily. Patient taking differently: Place 1 drop into both eyes daily.  01/28/14  Yes Albertine Patricia, MD  prednisoLONE (PRELONE) 15 MG/5ML SOLN Take 4-6 mg by mouth 2 (two) times daily. Give 6 mg every morning and 4 mg every evening   Yes Historical Provider, MD  saccharomyces boulardii (FLORASTOR) 250 MG capsule 250 mg by PEG Tube route 2 (two) times daily.    Yes Historical  Provider, MD  Sodium Phosphates (RA SALINE ENEMA) 19-7 GM/118ML ENEM Place 1 each rectally as needed (for constipation).   Yes Historical Provider, MD  cefTRIAXone (ROCEPHIN) 40 MG/ML IVPB Inject 50 mLs (2 g total) into the vein daily. For 4 weeks from 08/07/14. Patient not taking: Reported on 11/26/2014 08/12/14   Jonetta Osgood, MD  cephALEXin (KEFLEX) 500 MG capsule Take 1 capsule (500 mg total) by mouth 2 (two) times daily. 11/26/14   Julianne Rice, MD  hydrocortisone (CORTEF) 5 mg/mL SUSP Place 5 mLs (25 mg total) into feeding tube daily at 6 (six) AM. Patient not taking: Reported on 11/26/2014 03/13/14   Jonetta Osgood, MD  hydrocortisone (CORTEF) 5 mg/mL SUSP Place 3 mLs (15 mg total) into feeding tube every evening. Patient not taking: Reported on 11/26/2014 08/12/14   Jonetta Osgood, MD  Nutritional Supplements (FEEDING SUPPLEMENT, OSMOLITE 1.5 CAL,) LIQD Place 1,000 mLs into feeding tube daily. At 69 cc/hr Patient not taking: Reported on 11/26/2014 08/12/14   Jonetta Osgood, MD  Water For Irrigation, Sterile (FREE WATER) SOLN Place 250 mLs into feeding tube every 4 (four) hours. Patient not taking: Reported on 08/06/2014 03/13/14   Jonetta Osgood, MD   BP 109/53 mmHg  Pulse 79  Temp(Src) 98.6 F (37 C) (Oral)  Resp 11  SpO2 96% Physical Exam  Constitutional: He appears well-developed and well-nourished. He appears distressed.  HENT:  Head: Normocephalic and atraumatic.  Mouth/Throat: Oropharynx is clear and moist.  Eyes: EOM are normal. Pupils are equal, round, and reactive to light.  Neck: Normal range of motion. Neck supple.  Cardiovascular: Normal rate and regular rhythm.   Pulmonary/Chest: Effort normal and breath sounds normal. No respiratory distress. He has no wheezes. He has no rales.  Abdominal: Soft. Bowel sounds are normal. He exhibits distension. He exhibits no mass. There is tenderness. There is no rebound and no guarding.  Patient with G-tube and left  upper quadrant. Diffuse abdominal distention. Patient seems tender to palpation in the epigastric and right upper quadrants.  Musculoskeletal: Normal range of motion. He exhibits no edema or tenderness.  Extremities are contracted.   Neurological: He is alert.  Patient follows simple commands. Nonverbal. Has some movement of bilateral lower extremities though both upper and lower extremities are chronically contracted.  Skin: Skin is warm and dry. No rash noted. No erythema.  Psychiatric: He has a normal mood and affect. His behavior is normal.  Nursing note and vitals reviewed.   ED Course  Procedures (including critical care time) Labs Review Labs Reviewed  COMPREHENSIVE METABOLIC PANEL - Abnormal; Notable for the following:    Sodium 131 (*)    Chloride 96 (*)    Creatinine, Ser 0.53 (*)    Calcium 8.5 (*)    Albumin 3.0 (*)    ALT 12 (*)    Alkaline  Phosphatase 153 (*)    Total Bilirubin <0.1 (*)    All other components within normal limits  URINALYSIS, ROUTINE W REFLEX MICROSCOPIC (NOT AT [facility]) - Abnormal; Notable for the following:    APPearance TURBID (*)    Hgb urine dipstick MODERATE (*)    Leukocytes, UA LARGE (*)    All other components within normal limits  CBC WITH DIFFERENTIAL/PLATELET - Abnormal; Notable for the following:    Hemoglobin 10.2 (*)    HCT 31.5 (*)    MCV 72.7 (*)    MCH 23.6 (*)    Eosinophils Relative 7 (*)    All other components within normal limits  PROTIME-INR - Abnormal; Notable for the following:    Prothrombin Time 15.8 (*)    All other components within normal limits  URINE MICROSCOPIC-ADD ON - Abnormal; Notable for the following:    Bacteria, UA MANY (*)    All other components within normal limits  CULTURE, BLOOD (ROUTINE X 2)  CULTURE, BLOOD (ROUTINE X 2)  URINE CULTURE  LIPASE, BLOOD  APTT  I-STAT CG4 LACTIC ACID, ED  POC OCCULT BLOOD, ED  I-STAT CG4 LACTIC ACID, ED  TYPE AND SCREEN    Imaging Review Ct Abdomen Pelvis W  Contrast  11/26/2014   CLINICAL DATA:  Abdominal pain and distention, bright red emesis. History of quadriplegia, adrenal insufficiency, Parkinson's disease.  EXAM: CT ABDOMEN AND PELVIS WITH CONTRAST  TECHNIQUE: Multidetector CT imaging of the abdomen and pelvis was performed using the standard protocol following bolus administration of intravenous contrast.  CONTRAST:  122mL OMNIPAQUE IOHEXOL 300 MG/ML  SOLN  COMPARISON:  CT abdomen and pelvis Aug 06, 2014  FINDINGS: LUNG BASES: Small RIGHT pleural effusion, dependent atelectasis, improved aeration from prior imaging. Heart size is normal, moderate coronary artery calcifications. No pericardial fluid collection.  SOLID ORGANS: The liver, spleen, gallbladder, pancreas and adrenal glands are unremarkable.  GASTROINTESTINAL TRACT: Gastrostomy tube in place with retaining bulb within the gastric lumen. Minimal focal skin thickening and subcutaneous gas at insertion site, similar to improved. Stomach is not distended. Small and large bowel are normal in course and caliber without inflammatory changes. Scattered colonic air-fluid levels. Normal appendix.  KIDNEYS/ URINARY TRACT: Kidneys are orthotopic, demonstrating symmetric enhancement. No nephrolithiasis, hydronephrosis or solid renal masses. 2.8 cm LEFT upper pole cyst. Additional too small to characterize hypodensities LEFT kidney. The unopacified ureters are normal in course and caliber. Delayed imaging through the kidneys demonstrates symmetric prompt contrast excretion within the proximal urinary collecting system. Urinary bladder is partially distended with mild circumferential wall thickening, similar and can be seen with chronic cystitis or, neurogenic bladder.  PERITONEUM/RETROPERITONEUM: Aortoiliac vessels are normal in course and caliber, moderate calcific atherosclerosis. No lymphadenopathy by CT size criteria. Prostate is not enlarged. No intraperitoneal free fluid nor free air.  SOFT TISSUE/OSSEOUS  STRUCTURES: Non-suspicious. Osteopenia. Ankylosis of the RIGHT sacroiliac joint. Multiple chronic compression fractures were present previously, moderate to severe L5-S1, unchanged. Patient is contracted.  IMPRESSION: Colonic air-fluid levels suggest enteritis without bowel obstruction.  Gastrostomy tube in place. Mild chronic cystitis versus neurogenic bladder.   Electronically Signed   By: Elon Alas M.D.   On: 11/26/2014 05:40   I have personally reviewed and evaluated these images and lab results as part of my medical decision-making.   EKG Interpretation None      MDM   Final diagnoses:  Enteritis  UTI (lower urinary tract infection)  Vomiting and diarrhea   Patient with diarrhea  in the emergency department. Stool sent for C. difficile. Patient is much more comfortable after morphine. Mild hypoxic episode suspect due to narcotic. Abdominal exam is benign. CT with diffuse air-fluid levels consistent with enteritis. UTI treated in the emergency department. Discharge home to nursing home facility.     Julianne Rice, MD 11/27/14 [id number]

## 2014-11-26 NOTE — Progress Notes (Signed)
MRN: [id number] Name: [name]  Sex: male Age: 59 y.o. DOB: [birth year]  [facility] #: [name] Facility/Room:214 Level Of Care: SNF Provider: Merrilee Seashore D Emergency Contacts: Extended Emergency Contact Information Primary Emergency Contact: Sisley,[name] Address: [address]          [place], [place] [address] [place] Home Phone: [phone] Mobile Phone: [phone] Relation: Mother Secondary Emergency Contact: Valinda Party [place] Home Phone: [phone] Relation: Brother Father: Charlis, Stuchell [place] Mobile Phone: [phone]  Code Status:   Allergies: Aspirin; Keflex; Nsaids; and Penicillins  Chief Complaint  Patient presents with  . Acute Visit  . Medical Management of Chronic Issues    HPI: Patient is 59 y.o. male with aphasia and quadriplegia being seen for allergic rx to Kelflex, acute UTI and to follow up on prostate abscess.  Past Medical History  Diagnosis Date  . Quadriplegia   . Depression   . Anemia   . Parkinson's disease   . Seizures   . Adrenal insufficiency   . Dysphagia 06/19/2013  . Aphasia 06/19/2013    Past Surgical History  Procedure Laterality Date  . Peg tube placement    . Peripherally inserted central catheter insertion    . Tonsillectomy  1962      Medication List    Notice    This visit is on the same day as an admission, and a visit start time could not be determined. If the visit took place after discharge, manually review the med list with the patient.     Current Outpatient Prescriptions on File Prior to Visit  Medication Sig Dispense Refill  . acetaminophen (TYLENOL) 325 MG tablet Place 2 tablets (650 mg total) into feeding tube every 6 (six) hours as needed for fever (Give 2 tablets via g-tube every 6 hours as needed for a fever greater than 101 degrees F).    . baclofen (LIORESAL) 10 MG tablet Place 1 tablet (10 mg total) into feeding tube 3 (three)  times daily. 30 each 0  . bisacodyl (DULCOLAX) 10 MG suppository Place 10 mg rectally daily as needed for moderate constipation. If constipation not relieved by milk of magnesia give one 10 mg suppository in 24hours    . famotidine (PEPCID) 20 MG tablet Place 1 tablet (20 mg total) into feeding tube 2 (two) times daily. Give 1 tablet via peg tube twice daily    . ferrous sulfate 220 (44 FE) MG/5ML solution Place 330 mg into feeding tube See admin instructions. Give 7.38ml  Per tube every morning    . folic acid (FOLVITE) 1 MG tablet 1 mg by PEG Tube route daily.    . hydrocortisone (CORTEF) 5 mg/mL SUSP Place 5 mLs (25 mg total) into feeding tube daily at 6 (six) AM. (Patient not taking: Reported on 11/26/2014)    . hydrocortisone (CORTEF) 5 mg/mL SUSP Place 3 mLs (15 mg total) into feeding tube every evening. (Patient not taking: Reported on 11/26/2014)    . loratadine (CLARITIN) 10 MG tablet 10 mg by PEG Tube route daily.    . magnesium hydroxide (MILK OF MAGNESIA) 400 MG/5ML suspension Take 30 mLs by mouth daily as needed for mild constipation.    . metoCLOPramide (REGLAN) 10 MG tablet Place 1 tablet (10 mg total) into feeding tube 4 (four) times daily -  before meals and at bedtime. Via Peg tube    . Multiple Vitamins-Minerals (CENTAMIN) LIQD 5 mLs by PEG Tube route  daily.     . Nutritional Supplements (FEEDING SUPPLEMENT, OSMOLITE 1.5 CAL,) LIQD Place 1,000 mLs into feeding tube daily. At 55 cc/hr (Patient not taking: Reported on 11/26/2014)  0  . olopatadine (PATANOL) 0.1 % ophthalmic solution Place 1 drop into both eyes every morning. (Patient taking differently: Place 1 drop into both eyes every morning. ) 5 mL 12  . phenytoin (DILANTIN) 125 MG/5ML suspension Place 4 mLs (100 mg total) into feeding tube 2 (two) times daily. 237 mL 12  . polyethylene glycol (MIRALAX / GLYCOLAX) packet Place 17 g into feeding tube daily. 14 each 0  . polyvinyl alcohol (LIQUIFILM TEARS) 1.4 % ophthalmic solution  Place 1 drop into both eyes daily. (Patient taking differently: Place 1 drop into both eyes daily. ) 15 mL 0  . prednisoLONE (PRELONE) 15 MG/5ML SOLN Take 4-6 mg by mouth 2 (two) times daily. Give 6 mg every morning and 4 mg every evening    . saccharomyces boulardii (FLORASTOR) 250 MG capsule 250 mg by PEG Tube route 2 (two) times daily.     . Sodium Phosphates (RA SALINE ENEMA) 19-7 GM/118ML ENEM Place 1 each rectally as needed (for constipation).    . Water For Irrigation, Sterile (FREE WATER) SOLN Place 250 mLs into feeding tube every 4 (four) hours. (Patient not taking: Reported on 08/06/2014)     No current facility-administered medications on file prior to visit.     No orders of the defined types were placed in this encounter.    Immunization History  Administered Date(s) Administered  . Influenza Whole 12/31/2012  . Influenza-Unspecified 01/07/2014  . PPD Test 08/22/2008  . Pneumococcal-Unspecified 06/23/2010    Social History  Substance Use Topics  . Smoking status: Never Smoker   . Smokeless tobacco: Never Used  . Alcohol Use: No    Review of Systems  DATA OBTAINED: from nurse, medical record; pt with aphasia GENERAL:  no fevers, fatigue, appetite changes SKIN:  itching, rash present and resolved HEENT: No complaint RESPIRATORY: No cough, wheezing, SOB CARDIAC: No chest pain, palpitations, lower extremity edema  GI: No abdominal pain, No N/V/D or constipation, No heartburn or reflux  GU: No dysuria, frequency or urgency, or incontinence  MUSCULOSKELETAL: No unrelieved bone/joint pain NEUROLOGIC: No headache, dizziness  PSYCHIATRIC: No overt anxiety or sadness  Filed Vitals:   11/28/14 1253  BP: 125/70  Pulse: 79  Temp: 98.8 F (37.1 C)  Resp: 19    Physical Exam  GENERAL APPEARANCE: Alert, nonconversant, No acute distress  SKIN: No diaphoresis rash, or wounds HEENT: Unremarkable RESPIRATORY: Breathing is even, unlabored. Lung sounds are clear    CARDIOVASCULAR: Heart RRR no murmurs, rubs or gallops. No peripheral edema  GASTROINTESTINAL: Abdomen is soft, non-tender, not distended w/ normal bowel sounds.  GENITOURINARY: Bladder non tender, not distended  MUSCULOSKELETAL: wasting,contractures NEUROLOGIC: Cranial nerves 2-12 grossly intact. Functional quad PSYCHIATRIC:, no behavioral issues  Patient Active Problem List   Diagnosis Date Noted  . Tinea cruris 09/10/2014  . Aspiration pneumonia 08/15/2014  . Abscess, prostate 08/15/2014  . Pyelonephritis 08/06/2014  . Nausea & vomiting   . Fever presenting with conditions classified elsewhere 03/16/2014  . Altered mental status 03/16/2014  . Acute bronchiolitis due to unspecified organism 03/11/2014  . Enteritis due to Clostridium difficile 03/11/2014  . Type 2 diabetes mellitus without complication 03/11/2014  . Septic shock 01/26/2014  . Sepsis 01/25/2014  . Acute encephalopathy 01/02/2014  . Acute respiratory failure with hypercapnia 01/02/2014  . Chronic anemia  01/02/2014  . Subdural hematoma 01/02/2014  . Pain in thoracic spine 12/26/2013  . PEG (percutaneous endoscopic gastrostomy) status 10/04/2013  . Rash and nonspecific skin eruption 07/08/2013  . Thrush 07/08/2013  . Heme positive stool 07/07/2013  . Severe sepsis 06/23/2013  . E. coli pyelonephritis 06/22/2013  . SIRS (systemic inflammatory response syndrome) 06/19/2013  . Leukocytosis 06/19/2013  . Bronchitis 06/19/2013  . Dysphagia 06/19/2013  . Aphasia 06/19/2013  . Other convulsions 08/10/2012  . Iron deficiency anemia 08/10/2012  . Glucocorticoid deficiency 08/10/2012  . Febrile illness, acute 06/15/2012  . UTI (lower urinary tract infection) 06/15/2012  . Hypokalemia 11/10/2011  . Diabetic gastroparesis 11/08/2011  . Ileus 11/08/2011  . Hyponatremia 11/08/2011  . Adrenal insufficiency 11/08/2011  . Quadriplegia 11/08/2011  . Parkinson's disease   . Seizures   . Depression     CBC     Component Value Date/Time   WBC 6.3 11/26/2014 0407   WBC 7.0 09/24/2013   RBC 4.33 11/26/2014 0407   HGB 10.2* 11/26/2014 0407   HCT 31.5* 11/26/2014 0407   PLT 295 11/26/2014 0407   MCV 72.7* 11/26/2014 0407   LYMPHSABS 2.2 11/26/2014 0407   MONOABS 0.8 11/26/2014 0407   EOSABS 0.4 11/26/2014 0407   BASOSABS 0.0 11/26/2014 0407    CMP     Component Value Date/Time   NA 131* 11/26/2014 0407   NA 134* 09/24/2013   K 3.8 11/26/2014 0407   CL 96* 11/26/2014 0407   CO2 27 11/26/2014 0407   GLUCOSE 93 11/26/2014 0407   BUN 11 11/26/2014 0407   BUN 18 09/24/2013   CREATININE 0.53* 11/26/2014 0407   CREATININE 0.5* 09/24/2013   CALCIUM 8.5* 11/26/2014 0407   PROT 7.7 11/26/2014 0407   ALBUMIN 3.0* 11/26/2014 0407   AST 25 11/26/2014 0407   ALT 12* 11/26/2014 0407   ALKPHOS 153* 11/26/2014 0407   BILITOT <0.1* 11/26/2014 0407   GFRNONAA >60 11/26/2014 0407   GFRAA >60 11/26/2014 0407    Assessment and Plan  UTI (lower urinary tract infection) Pt was sent to Ed for vomiting blood. That wasn't addressed in ED and hasn't recurred. In the meantime pt had a dirty urine and was started on Keflex empirically and sent  back to SNF. Pt broke out in rash after first dose so Keflex d/c. Pt had no sx and urine looked OK so U/A with C and S was ordered. On 9/2 C and S available in EPIC from ED/ hospital visit with > 100,000 E Coli resistant to ampicillin and Cipro so would have been resistant to Keflex. Will start macrobid 100 mg BID for 7 days.   Abscess, prostate Pt had his month of IV ab without problems. On f/u with urology in June it was not felt that pt needed a follow up CT or U/S unless he developed new sx.  Rash and nonspecific skin eruption Rash is resolved. However pt did have uricarial rash to Keflex. Will add kflex to list of allergies.   > 35 min was spent; > 50% of time with patient was spent reviewing records, labs, tests and studies, counseling and developing plan of  care  Margit Hanks, MD

## 2014-11-26 NOTE — ED Notes (Signed)
Pt to ED via [facility] from [facility] c/o altered mental status and one episode of bright red emesis. Per staff, pt usually nonverbal. Today Pt has been moaning and grimacing. Pt able to follow commands. From paperwork sent from facility, pt had an abdominal abscess in May. Pt moaning in pain, abdominal distention noted

## 2014-11-27 DIAGNOSIS — N390 Urinary tract infection, site not specified: Secondary | ICD-10-CM | POA: Diagnosis not present

## 2014-11-28 ENCOUNTER — Encounter: Payer: Self-pay | Admitting: Internal Medicine

## 2014-11-28 NOTE — Assessment & Plan Note (Signed)
Pt had his month of IV ab without problems. On f/u with urology in June it was not felt that pt needed a follow up CT or U/S unless he developed new sx.

## 2014-11-28 NOTE — Assessment & Plan Note (Signed)
Pt was sent to Ed for vomiting blood. That wasn't addressed in ED and hasn't recurred. In the meantime pt had a dirty urine and was started on Keflex empirically and sent  back to SNF. Pt broke out in rash after first dose so Keflex d/c. Pt had no sx and urine looked OK so U/A with C and S was ordered. On 9/2 C and S available in EPIC from ED/ hospital visit with > 100,000 E Coli resistant to ampicillin and Cipro so would have been resistant to Keflex. Will start macrobid 100 mg BID for 7 days.

## 2014-11-28 NOTE — Assessment & Plan Note (Signed)
Rash is resolved. However pt did have uricarial rash to Keflex. Will add kflex to list of allergies.

## 2014-11-29 LAB — URINE CULTURE: Culture: >=100000

## 2014-11-30 ENCOUNTER — Telehealth (HOSPITAL_COMMUNITY): Payer: Self-pay | Admitting: Emergency Medicine

## 2014-11-30 NOTE — Telephone Encounter (Signed)
Post ED Visit - Positive Culture Follow-up: Successful Patient Follow-Up  Culture assessed and recommendations reviewed by: []  Heide Guile, Pharm.D., BCPS-AQ ID []  Alycia Rossetti, Pharm.D., BCPS []  [place], [place].D., BCPS, AAHIVP [x]  Legrand Como, Pharm.D., BCPS, AAHIVP []  Tegan Magsam, Pharm.D. []  Milus Glazier, [place].D.  Positive Urine culture, + VRE  Result called to [facility] [phone])  and faxed to [place]  Ernesta Amble 11/30/2014, 5:07 PM

## 2014-12-01 LAB — CULTURE, BLOOD (ROUTINE X 2)
Culture: NO GROWTH
Culture: NO GROWTH

## 2014-12-10 DIAGNOSIS — K449 Diaphragmatic hernia without obstruction or gangrene: Secondary | ICD-10-CM | POA: Diagnosis not present

## 2014-12-10 DIAGNOSIS — K319 Disease of stomach and duodenum, unspecified: Secondary | ICD-10-CM | POA: Diagnosis not present

## 2014-12-10 DIAGNOSIS — Z9889 Other specified postprocedural states: Secondary | ICD-10-CM | POA: Diagnosis not present

## 2014-12-10 DIAGNOSIS — Z931 Gastrostomy status: Secondary | ICD-10-CM | POA: Diagnosis not present

## 2014-12-10 DIAGNOSIS — N186 End stage renal disease: Secondary | ICD-10-CM | POA: Diagnosis not present

## 2014-12-10 DIAGNOSIS — D509 Iron deficiency anemia, unspecified: Secondary | ICD-10-CM | POA: Diagnosis not present

## 2014-12-10 DIAGNOSIS — Z79899 Other long term (current) drug therapy: Secondary | ICD-10-CM | POA: Diagnosis not present

## 2014-12-10 DIAGNOSIS — K2210 Ulcer of esophagus without bleeding: Secondary | ICD-10-CM | POA: Diagnosis not present

## 2014-12-10 DIAGNOSIS — R1310 Dysphagia, unspecified: Secondary | ICD-10-CM | POA: Diagnosis not present

## 2014-12-10 DIAGNOSIS — K219 Gastro-esophageal reflux disease without esophagitis: Secondary | ICD-10-CM | POA: Diagnosis not present

## 2014-12-10 DIAGNOSIS — G20 Parkinson's disease: Secondary | ICD-10-CM | POA: Diagnosis not present

## 2014-12-10 DIAGNOSIS — K2950 Unspecified chronic gastritis without bleeding: Secondary | ICD-10-CM | POA: Diagnosis not present

## 2014-12-22 DIAGNOSIS — Z23 Encounter for immunization: Secondary | ICD-10-CM | POA: Diagnosis not present

## 2014-12-29 ENCOUNTER — Non-Acute Institutional Stay (SKILLED_NURSING_FACILITY): Payer: Medicare Other | Admitting: Internal Medicine

## 2014-12-29 ENCOUNTER — Encounter: Payer: Self-pay | Admitting: Internal Medicine

## 2014-12-29 DIAGNOSIS — D509 Iron deficiency anemia, unspecified: Secondary | ICD-10-CM

## 2014-12-29 DIAGNOSIS — K3184 Gastroparesis: Secondary | ICD-10-CM

## 2014-12-29 DIAGNOSIS — E1143 Type 2 diabetes mellitus with diabetic autonomic (poly)neuropathy: Secondary | ICD-10-CM

## 2014-12-29 DIAGNOSIS — R1310 Dysphagia, unspecified: Secondary | ICD-10-CM | POA: Diagnosis not present

## 2014-12-29 DIAGNOSIS — E2740 Unspecified adrenocortical insufficiency: Secondary | ICD-10-CM | POA: Diagnosis not present

## 2014-12-29 DIAGNOSIS — R29898 Other symptoms and signs involving the musculoskeletal system: Secondary | ICD-10-CM | POA: Diagnosis not present

## 2014-12-29 DIAGNOSIS — G8250 Quadriplegia, unspecified: Secondary | ICD-10-CM

## 2014-12-29 NOTE — Progress Notes (Signed)
Patient ID: [name], male   DOB: 04-04-1955, 59 y.o.   MRN: [id number] MRN: [id number] Name: [name]  Sex: male Age: 59 y.o. DOB: 02/07/1956  [facility] #: [name] Facility/Room:214 Level Of Care: SNF Provider: Roena Malady Emergency Contacts: Extended Emergency Contact Information Primary Emergency Contact: Jonsson,[name] Address: [address]          [place], [place] [address] [place] Home Phone: [phone] Mobile Phone: [phone] Relation: Mother Secondary Emergency Contact: Valinda Party [place] Home Phone: [phone] Relation: Brother Father: Haiden, Garde [place] Mobile Phone: [phone]  Code Status:   Allergies: Aspirin; Keflex; Nsaids; and Penicillins  Chief Complaint  Patient presents with  . Medical Management of Chronic Issues    HPI: Patient is 59 y.o. male with aphasia and quadriplegia  Being seen for chronic medical conditions that also include history of UTIs-history prostate abscess-history of adrenal insufficiency-history of seizures-history of dysphagia and diabetic gastro-paresis.  His mother also feels he has some increased right arm weakness and I'm following up on this.  Per nursing patient has been quite stable recently considering his numerous comorbidities he was seen in late August by Dr. Lyn Hollingshead for UTI this appears to have resolved.  He also has a history of a prostate abscess he required a month of IV antibiotic-follow-up with urology back in June it wasn't felt he needed a follow-up CT or ultrasound unless he developed new symptoms.  His vital signs appear to be stable he is a aphasic but nursing staff does not report any acute concerns again his mother is concerned about some possible right arm weakness-however speaking with her today she feels this is better today-I suspect he may benefit from physical therapy consult   .  Past Medical History  Diagnosis Date   . Quadriplegia (HCC)   . Depression   . Anemia   . Parkinson's disease (HCC)   . Seizures (HCC)   . Adrenal insufficiency (HCC)   . Dysphagia 06/19/2013  . Aphasia 06/19/2013    Past Surgical History  Procedure Laterality Date  . Peg tube placement    . Peripherally inserted central catheter insertion    . Tonsillectomy  1962      Medication List       This list is accurate as of: 12/29/14 11:59 PM.  Always use your most recent med list.               acetaminophen 325 MG tablet  Commonly known as:  TYLENOL  Place 2 tablets (650 mg total) into feeding tube every 6 (six) hours as needed for fever (Give 2 tablets via g-tube every 6 hours as needed for a fever greater than 101 degrees F).     baclofen 10 MG tablet  Commonly known as:  LIORESAL  Place 1 tablet (10 mg total) into feeding tube 3 (three) times daily.     bisacodyl 10 MG suppository  Commonly known as:  DULCOLAX  Place 10 mg rectally daily as needed for moderate constipation. If constipation not relieved by milk of magnesia give one 10 mg suppository in 24hours     CENTAMIN Liqd  5 mLs by PEG Tube route daily.     famotidine 20 MG tablet  Commonly known as:  PEPCID  Place 1 tablet (20 mg total) into feeding tube 2 (two) times daily. Give 1 tablet via peg tube twice daily     feeding supplement (OSMOLITE 1.5 CAL)  Liqd  Place 1,000 mLs into feeding tube daily. At 55 cc/hr     ferrous sulfate 220 (44 FE) MG/5ML solution  Place 330 mg into feeding tube See admin instructions. Give 7.55ml  Per tube every morning     folic acid 1 MG tablet  Commonly known as:  FOLVITE  1 mg by PEG Tube route daily.     free water Soln  Place 250 mLs into feeding tube every 4 (four) hours.     hydrocortisone 5 mg/mL Susp  Commonly known as:  CORTEF  Place 5 mLs (25 mg total) into feeding tube daily at 6 (six) AM.     hydrocortisone 5 mg/mL Susp  Commonly known as:  CORTEF  Place 3 mLs (15 mg total) into feeding tube  every evening.     loratadine 10 MG tablet  Commonly known as:  CLARITIN  10 mg by PEG Tube route daily.     magnesium hydroxide 400 MG/5ML suspension  Commonly known as:  MILK OF MAGNESIA  Take 30 mLs by mouth daily as needed for mild constipation.     metoCLOPramide 10 MG tablet  Commonly known as:  REGLAN  Place 1 tablet (10 mg total) into feeding tube 4 (four) times daily -  before meals and at bedtime. Via Peg tube     olopatadine 0.1 % ophthalmic solution  Commonly known as:  PATANOL  Place 1 drop into both eyes every morning.     pantoprazole 40 MG tablet  Commonly known as:  PROTONIX  Take 40 mg by mouth 2 (two) times daily.     phenytoin 125 MG/5ML suspension  Commonly known as:  DILANTIN  Place 4 mLs (100 mg total) into feeding tube 2 (two) times daily.     polyethylene glycol packet  Commonly known as:  MIRALAX / GLYCOLAX  Place 17 g into feeding tube daily.     polyvinyl alcohol 1.4 % ophthalmic solution  Commonly known as:  LIQUIFILM TEARS  Place 1 drop into both eyes daily.     prednisoLONE 15 MG/5ML Soln  Commonly known as:  PRELONE  Take 4-6 mg by mouth 2 (two) times daily. Give 6 mg every morning and 4 mg every evening     RA SALINE ENEMA 19-7 GM/118ML Enem  Place 1 each rectally as needed (for constipation).     saccharomyces boulardii 250 MG capsule  Commonly known as:  FLORASTOR  250 mg by PEG Tube route 2 (two) times daily.       Current Outpatient Prescriptions on File Prior to Visit  Medication Sig Dispense Refill  . acetaminophen (TYLENOL) 325 MG tablet Place 2 tablets (650 mg total) into feeding tube every 6 (six) hours as needed for fever (Give 2 tablets via g-tube every 6 hours as needed for a fever greater than 101 degrees F).    . baclofen (LIORESAL) 10 MG tablet Place 1 tablet (10 mg total) into feeding tube 3 (three) times daily. 30 each 0  . bisacodyl (DULCOLAX) 10 MG suppository Place 10 mg rectally daily as needed for moderate  constipation. If constipation not relieved by milk of magnesia give one 10 mg suppository in 24hours    . famotidine (PEPCID) 20 MG tablet Place 1 tablet (20 mg total) into feeding tube 2 (two) times daily. Give 1 tablet via peg tube twice daily    . ferrous sulfate 220 (44 FE) MG/5ML solution Place 330 mg into feeding tube See admin instructions. Give 7.55ml  Per tube every morning    . folic acid (FOLVITE) 1 MG tablet 1 mg by PEG Tube route daily.    . hydrocortisone (CORTEF) 5 mg/mL SUSP Place 5 mLs (25 mg total) into feeding tube daily at 6 (six) AM. (Patient not taking: Reported on 11/26/2014)    . hydrocortisone (CORTEF) 5 mg/mL SUSP Place 3 mLs (15 mg total) into feeding tube every evening. (Patient not taking: Reported on 11/26/2014)    . loratadine (CLARITIN) 10 MG tablet 10 mg by PEG Tube route daily.    . magnesium hydroxide (MILK OF MAGNESIA) 400 MG/5ML suspension Take 30 mLs by mouth daily as needed for mild constipation.    . metoCLOPramide (REGLAN) 10 MG tablet Place 1 tablet (10 mg total) into feeding tube 4 (four) times daily -  before meals and at bedtime. Via Peg tube    . Multiple Vitamins-Minerals (CENTAMIN) LIQD 5 mLs by PEG Tube route daily.     . Nutritional Supplements (FEEDING SUPPLEMENT, OSMOLITE 1.5 CAL,) LIQD Place 1,000 mLs into feeding tube daily. At 55 cc/hr (Patient not taking: Reported on 11/26/2014)  0  . olopatadine (PATANOL) 0.1 % ophthalmic solution Place 1 drop into both eyes every morning. (Patient taking differently: Place 1 drop into both eyes every morning. ) 5 mL 12  . phenytoin (DILANTIN) 125 MG/5ML suspension Place 4 mLs (100 mg total) into feeding tube 2 (two) times daily. 237 mL 12  . polyethylene glycol (MIRALAX / GLYCOLAX) packet Place 17 g into feeding tube daily. 14 each 0  . polyvinyl alcohol (LIQUIFILM TEARS) 1.4 % ophthalmic solution Place 1 drop into both eyes daily. (Patient taking differently: Place 1 drop into both eyes daily. ) 15 mL 0  .  prednisoLONE (PRELONE) 15 MG/5ML SOLN Take 4-6 mg by mouth 2 (two) times daily. Give 6 mg every morning and 4 mg every evening    . saccharomyces boulardii (FLORASTOR) 250 MG capsule 250 mg by PEG Tube route 2 (two) times daily.     . Sodium Phosphates (RA SALINE ENEMA) 19-7 GM/118ML ENEM Place 1 each rectally as needed (for constipation).    . Water For Irrigation, Sterile (FREE WATER) SOLN Place 250 mLs into feeding tube every 4 (four) hours. (Patient not taking: Reported on 08/06/2014)     No current facility-administered medications on file prior to visit.     Meds ordered this encounter  Medications  . pantoprazole (PROTONIX) 40 MG tablet    Sig: Take 40 mg by mouth 2 (two) times daily.    Immunization History  Administered Date(s) Administered  . Influenza Whole 12/31/2012  . Influenza-Unspecified 01/07/2014  . PPD Test 08/22/2008  . Pneumococcal-Unspecified 06/23/2010    Social History  Substance Use Topics  . Smoking status: Never Smoker   . Smokeless tobacco: Never Used  . Alcohol Use: No    Review of Systems  DATA OBTAINED: from nurse, medical record--mother; pt with aphasia GENERAL:  no fevers, fatigue, appetite changes SKIN:  Had a history of rash but apparently this has resolved HEENT: No complaint RESPIRATORY: No cough, wheezing, SOB CARDIAC: No chest pain, palpitations, lower extremity edema  GI: No abdominal pain, No N/V/D or constipation, No heartburn or reflux has PEG GU: No dysuria, frequency or urgency, or incontinence  MUSCULOSKELETAL: No unrelieved bone/joint pain--is able to move right arm but apparently has had some increased weakness although again this appears to be better today NEUROLOGIC: No headache, dizziness  PSYCHIATRIC: No overt anxiety or sadness  Filed Vitals:   12/29/14 2240  BP: 122/74  Pulse: 76  Temp: 98.8 F (37.1 C)  Resp: 19    Physical Exam  GENERAL APPEARANCE: Alert, nonconversant, No acute distress resting comfortably  in bed-smiling SKIN: No diaphoresis rash, or wounds HEENT: Unremarkable RESPIRATORY: Breathing is even, unlabored. Lung sounds are clear   CARDIOVASCULAR: Heart RRR no murmurs, rubs or gallops. No peripheral edema  GASTROINTESTINAL: Abdomen is soft, non-tender, not distended w/ normal bowel sounds  PEG appears to be unremarkable.  GENITOURINARY: Bladder non tender, not distended  MUSCULOSKELETAL: wasting,contractures --is able to move his right upper extremity and did not note any acute deformity there is no increased erythema edema-grip strength appears to be intact as well as radial pulse-this appears to be relatively baseline he does have some contracture NEUROLOGIC: Cranial nerves 2-12 grossly intact. Functional quad PSYCHIATRIC:, no behavioral issues  Patient Active Problem List   Diagnosis Date Noted  . Tinea cruris 09/10/2014  . Aspiration pneumonia (HCC) 08/15/2014  . Abscess, prostate 08/15/2014  . Pyelonephritis 08/06/2014  . Nausea & vomiting   . Fever presenting with conditions classified elsewhere 03/16/2014  . Altered mental status 03/16/2014  . Acute bronchiolitis due to unspecified organism 03/11/2014  . Enteritis due to Clostridium difficile 03/11/2014  . Type 2 diabetes mellitus without complication (HCC) 03/11/2014  . Septic shock (HCC) 01/26/2014  . Sepsis (HCC) 01/25/2014  . Acute encephalopathy 01/02/2014  . Acute respiratory failure with hypercapnia (HCC) 01/02/2014  . Chronic anemia 01/02/2014  . Subdural hematoma (HCC) 01/02/2014  . Pain in thoracic spine 12/26/2013  . PEG (percutaneous endoscopic gastrostomy) status (HCC) 10/04/2013  . Rash and nonspecific skin eruption 07/08/2013  . Thrush 07/08/2013  . Heme positive stool 07/07/2013  . Severe sepsis (HCC) 06/23/2013  . E. coli pyelonephritis 06/22/2013  . SIRS (systemic inflammatory response syndrome) (HCC) 06/19/2013  . Leukocytosis 06/19/2013  . Bronchitis 06/19/2013  . Dysphagia 06/19/2013  .  Aphasia 06/19/2013  . Other convulsions 08/10/2012  . Iron deficiency anemia 08/10/2012  . Glucocorticoid deficiency (HCC) 08/10/2012  . Febrile illness, acute 06/15/2012  . UTI (lower urinary tract infection) 06/15/2012  . Hypokalemia 11/10/2011  . Diabetic gastroparesis (HCC) 11/08/2011  . Ileus (HCC) 11/08/2011  . Hyponatremia 11/08/2011  . Adrenal insufficiency (HCC) 11/08/2011  . Quadriplegia (HCC) 11/08/2011  . Parkinson's disease (HCC)   . Seizures (HCC)   . Depression     CBC    Component Value Date/Time   WBC 6.3 11/26/2014 0407   WBC 7.0 09/24/2013   RBC 4.33 11/26/2014 0407   HGB 10.2* 11/26/2014 0407   HCT 31.5* 11/26/2014 0407   PLT 295 11/26/2014 0407   MCV 72.7* 11/26/2014 0407   LYMPHSABS 2.2 11/26/2014 0407   MONOABS 0.8 11/26/2014 0407   EOSABS 0.4 11/26/2014 0407   BASOSABS 0.0 11/26/2014 0407    CMP     Component Value Date/Time   NA 131* 11/26/2014 0407   NA 134* 09/24/2013   K 3.8 11/26/2014 0407   CL 96* 11/26/2014 0407   CO2 27 11/26/2014 0407   GLUCOSE 93 11/26/2014 0407   BUN 11 11/26/2014 0407   BUN 18 09/24/2013   CREATININE 0.53* 11/26/2014 0407   CREATININE 0.5* 09/24/2013   CALCIUM 8.5* 11/26/2014 0407   PROT 7.7 11/26/2014 0407   ALBUMIN 3.0* 11/26/2014 0407   AST 25 11/26/2014 0407   ALT 12* 11/26/2014 0407   ALKPHOS 153* 11/26/2014 0407   BILITOT <0.1* 11/26/2014 0407   GFRNONAA >  60 11/26/2014 0407   GFRAA >60 11/26/2014 0407    Assessment and Plan History of UTIs-this appears to be stable currently elderly certainly is at risk was recently treated for Escherichia coli with Macrobid and this appears to have resolved.  #2 history of prostate abscess patient did have a month of IV antibiotic this appears to be stable he is followed by urology and was not felt he needed follow-up with CT or ultrasound unless he develops new symptoms.  History of aspiration pneumonia this has required hospitalization in the past he is at risk  with a history of quadriplegia and dysphasia with PEG tube-opacities required hospitalization with vancomycin and cephapirin.  History of adrenal insufficiency he continues on hydrocortisone and his baseline dose this appears to be stable.  History dysphagia he is status post peg tube.  History of gastroparesis he continues on Reglan this has not been an issue recently which is encouraging.  he is on Protonix twice a day as well  History of seizures he continues on Dilantin Will update a Dilantin level.  History of quadriplegia he remains at baseline and will order physical therapy consult per discussion with his mother about some possible right arm weakness at times although he appears to be relatively at his baseline this afternoon-he is also on baclofen 3 times a day.  History of iron deficiency anemia most recent hemoglobin#10.2 back in August will update this this appears to be relatively baseline he is on iron   In regards to adrenal insufficiency Will update electrolytes as well as hemoglobin A1c and TSH.  [id number] note greater than 35 minutes spent assessing patient-discussing his mother's concerns at bedside-and coordinating and formulating a plan of care for numerous diagnoses-of note greater than 50% of time spent coordinating plan of care with family in put.      Macio Kissoon C,

## 2014-12-30 DIAGNOSIS — Z794 Long term (current) use of insulin: Secondary | ICD-10-CM | POA: Diagnosis not present

## 2014-12-30 DIAGNOSIS — E2749 Other adrenocortical insufficiency: Secondary | ICD-10-CM | POA: Diagnosis not present

## 2015-01-02 ENCOUNTER — Encounter: Payer: Self-pay | Admitting: Internal Medicine

## 2015-01-02 ENCOUNTER — Non-Acute Institutional Stay (SKILLED_NURSING_FACILITY): Payer: Medicare Other | Admitting: Internal Medicine

## 2015-01-02 DIAGNOSIS — R05 Cough: Secondary | ICD-10-CM | POA: Diagnosis not present

## 2015-01-02 DIAGNOSIS — J690 Pneumonitis due to inhalation of food and vomit: Secondary | ICD-10-CM | POA: Diagnosis not present

## 2015-01-02 DIAGNOSIS — R0989 Other specified symptoms and signs involving the circulatory and respiratory systems: Secondary | ICD-10-CM | POA: Diagnosis not present

## 2015-01-02 DIAGNOSIS — E2740 Unspecified adrenocortical insufficiency: Secondary | ICD-10-CM

## 2015-01-02 DIAGNOSIS — I10 Essential (primary) hypertension: Secondary | ICD-10-CM | POA: Diagnosis not present

## 2015-01-02 NOTE — Progress Notes (Signed)
MRN: [id number] Name: [name]  Sex: male Age: 59 y.o. DOB: June 13, 1955  [facility] #: [name] Facility/Room: Level Of Care: SNF Provider: Merrilee Seashore D Emergency Contacts: Extended Emergency Contact Information Primary Emergency Contact: Grimmett,[name] Address: [address]          [place], [place] [address] [place] Home Phone: [phone] Mobile Phone: [phone] Relation: Mother Secondary Emergency Contact: Valinda Party [place] Home Phone: [phone] Relation: Brother Father: Leonard, F[place]ani [place] Mobile Phone: [phone]  Code Status: FULL  Allergies: Aspirin; Keflex; Nsaids; and Penicillins  Chief Complaint  Patient presents with  . Acute Visit    HPI: Patient is 59 y.o. male quadriplegic who is being seen today for cough onset yesterday , becoming congested today. Abnormal BS per nursing. No fever, O2 sat 98% on RA. No MS change, no nausea, vomiting or other systemic sx.  Past Medical History  Diagnosis Date  . Quadriplegia (HCC)   . Depression   . Anemia   . Parkinson's disease (HCC)   . Seizures (HCC)   . Adrenal insufficiency (HCC)   . Dysphagia 06/19/2013  . Aphasia 06/19/2013    Past Surgical History  Procedure Laterality Date  . Peg tube placement    . Peripherally inserted central catheter insertion    . Tonsillectomy  1962      Medication List       This list is accurate as of: 01/02/15 11:59 PM.  Always use your most recent med list.               acetaminophen 325 MG tablet  Commonly known as:  TYLENOL  Place 2 tablets (650 mg total) into feeding tube every 6 (six) hours as needed for fever (Give 2 tablets via g-tube every 6 hours as needed for a fever greater than 101 degrees F).     baclofen 10 MG tablet  Commonly known as:  LIORESAL  Place 1 tablet (10 mg total) into feeding tube 3 (three) times daily.     bisacodyl 10 MG suppository  Commonly known as:   DULCOLAX  Place 10 mg rectally daily as needed for moderate constipation. If constipation not relieved by milk of magnesia give one 10 mg suppository in 24hours     CENTAMIN Liqd  5 mLs by PEG Tube route daily.     famotidine 20 MG tablet  Commonly known as:  PEPCID  Place 1 tablet (20 mg total) into feeding tube 2 (two) times daily. Give 1 tablet via peg tube twice daily     feeding supplement (OSMOLITE 1.5 CAL) Liqd  Place 1,000 mLs into feeding tube daily. At 55 cc/hr     ferrous sulfate 220 (44 FE) MG/5ML solution  Place 330 mg into feeding tube See admin instructions. Give 7.13ml  Per tube every morning     folic acid 1 MG tablet  Commonly known as:  FOLVITE  1 mg by PEG Tube route daily.     free water Soln  Place 250 mLs into feeding tube every 4 (four) hours.     hydrocortisone 5 mg/mL Susp  Commonly known as:  CORTEF  Place 5 mLs (25 mg total) into feeding tube daily at 6 (six) AM.     hydrocortisone 5 mg/mL Susp  Commonly known as:  CORTEF  Place 3 mLs (15 mg total) into feeding tube every evening.     loratadine 10 MG tablet  Commonly known as:  CLARITIN  10 mg by PEG Tube route daily.     magnesium hydroxide 400 MG/5ML suspension  Commonly known as:  MILK OF MAGNESIA  Take 30 mLs by mouth daily as needed for mild constipation.     metoCLOPramide 10 MG tablet  Commonly known as:  REGLAN  Place 1 tablet (10 mg total) into feeding tube 4 (four) times daily -  before meals and at bedtime. Via Peg tube     olopatadine 0.1 % ophthalmic solution  Commonly known as:  PATANOL  Place 1 drop into both eyes every morning.     pantoprazole 40 MG tablet  Commonly known as:  PROTONIX  Take 40 mg by mouth 2 (two) times daily.     phenytoin 125 MG/5ML suspension  Commonly known as:  DILANTIN  Place 4 mLs (100 mg total) into feeding tube 2 (two) times daily.     polyethylene glycol packet  Commonly known as:  MIRALAX / GLYCOLAX  Place 17 g into feeding tube daily.      polyvinyl alcohol 1.4 % ophthalmic solution  Commonly known as:  LIQUIFILM TEARS  Place 1 drop into both eyes daily.     prednisoLONE 15 MG/5ML Soln  Commonly known as:  PRELONE  Take 4-6 mg by mouth 2 (two) times daily. Give 6 mg every morning and 4 mg every evening     RA SALINE ENEMA 19-7 GM/118ML Enem  Place 1 each rectally as needed (for constipation).     saccharomyces boulardii 250 MG capsule  Commonly known as:  FLORASTOR  250 mg by PEG Tube route 2 (two) times daily.        No orders of the defined types were placed in this encounter.    Immunization History  Administered Date(s) Administered  . Influenza Whole 12/31/2012  . Influenza-Unspecified 01/07/2014  . PPD Test 08/22/2008  . Pneumococcal-Unspecified 06/23/2010    Social History  Substance Use Topics  . Smoking status: Never Smoker   . Smokeless tobacco: Never Used  . Alcohol Use: No    Review of Systems  DATA OBTAINED: from nurse GENERAL:  no fevers, fatigue, appetite changes SKIN: No itching, rash HEENT: No complaint RESPIRATORY: + cough, no wheezing, no SOB CARDIAC: No chest pain, palpitations, lower extremity edema  GI: No abdominal pain, No N/V/D or constipation, No heartburn or reflux  GU: No dysuria, frequency or urgency, or incontinence  MUSCULOSKELETAL: No unrelieved bone/joint pain NEUROLOGIC: No headache, dizziness  PSYCHIATRIC: No overt anxiety or sadness  Filed Vitals:   01/02/15 1403  BP: 116/80  Pulse: 78  Temp: 97.1 F (36.2 C)  Resp: 18    Physical Exam  GENERAL APPEARANCE: Alert, aphasic,No acute distress, non toxic appearing  SKIN: No diaphoresis rash HEENT: Unremarkable RESPIRATORY: Breathing is even, unlabored. Lung sounds are rhonchi, some interstitial BS on R no frank rales or wheezes   CARDIOVASCULAR: Heart RRR no murmurs, rubs or gallops. No peripheral edema  GASTROINTESTINAL: Abdomen is soft, non-tender, not distended w/ normal bowel sounds; PEG.   GENITOURINARY: Bladder non tender, not distended  MUSCULOSKELETAL: wasting and contractures NEUROLOGIC: Cranial nerves 2-12 grossly intact; Quad with some UE movement PSYCHIATRIC: Mood and affect appropriate to situation, no behavioral issues  Patient Active Problem List   Diagnosis Date Noted  . Tinea cruris 09/10/2014  . Aspiration pneumonia (HCC) 08/15/2014  . Abscess, prostate 08/15/2014  . Pyelonephritis 08/06/2014  . Nausea & vomiting   . Fever presenting with conditions classified elsewhere 03/16/2014  .  Altered mental status 03/16/2014  . Acute bronchiolitis due to unspecified organism 03/11/2014  . Enteritis due to Clostridium difficile 03/11/2014  . Type 2 diabetes mellitus without complication (HCC) 03/11/2014  . Septic shock (HCC) 01/26/2014  . Sepsis (HCC) 01/25/2014  . Acute encephalopathy 01/02/2014  . Acute respiratory failure with hypercapnia (HCC) 01/02/2014  . Chronic anemia 01/02/2014  . Subdural hematoma (HCC) 01/02/2014  . Pain in thoracic spine 12/26/2013  . PEG (percutaneous endoscopic gastrostomy) status (HCC) 10/04/2013  . Rash and nonspecific skin eruption 07/08/2013  . Thrush 07/08/2013  . Heme positive stool 07/07/2013  . Severe sepsis (HCC) 06/23/2013  . E. coli pyelonephritis 06/22/2013  . SIRS (systemic inflammatory response syndrome) (HCC) 06/19/2013  . Leukocytosis 06/19/2013  . Bronchitis 06/19/2013  . Dysphagia 06/19/2013  . Aphasia 06/19/2013  . Other convulsions 08/10/2012  . Iron deficiency anemia 08/10/2012  . Glucocorticoid deficiency (HCC) 08/10/2012  . Febrile illness, acute 06/15/2012  . UTI (lower urinary tract infection) 06/15/2012  . Hypokalemia 11/10/2011  . Diabetic gastroparesis (HCC) 11/08/2011  . Ileus (HCC) 11/08/2011  . Hyponatremia 11/08/2011  . Adrenal insufficiency (HCC) 11/08/2011  . Quadriplegia (HCC) 11/08/2011  . Parkinson's disease (HCC)   . Seizures (HCC)   . Depression     CBC    Component Value  Date/Time   WBC 6.3 11/26/2014 0407   WBC 7.0 09/24/2013   RBC 4.33 11/26/2014 0407   HGB 10.2* 11/26/2014 0407   HCT 31.5* 11/26/2014 0407   PLT 295 11/26/2014 0407   MCV 72.7* 11/26/2014 0407   LYMPHSABS 2.2 11/26/2014 0407   MONOABS 0.8 11/26/2014 0407   EOSABS 0.4 11/26/2014 0407   BASOSABS 0.0 11/26/2014 0407    CMP     Component Value Date/Time   NA 131* 11/26/2014 0407   NA 134* 09/24/2013   K 3.8 11/26/2014 0407   CL 96* 11/26/2014 0407   CO2 27 11/26/2014 0407   GLUCOSE 93 11/26/2014 0407   BUN 11 11/26/2014 0407   BUN 18 09/24/2013   CREATININE 0.53* 11/26/2014 0407   CREATININE 0.5* 09/24/2013   CALCIUM 8.5* 11/26/2014 0407   PROT 7.7 11/26/2014 0407   ALBUMIN 3.0* 11/26/2014 0407   AST 25 11/26/2014 0407   ALT 12* 11/26/2014 0407   ALKPHOS 153* 11/26/2014 0407   BILITOT <0.1* 11/26/2014 0407   GFRNONAA >60 11/26/2014 0407   GFRAA >60 11/26/2014 0407    Assessment and Plan  Aspiration pneumonia H/o aspiration PNA , tx with rocephin (PCN allergic), gets septic 2-3 times yearly, 5 months since last. Pt goes down fast. CXR and CBC, BMP ordered. If nothing back before I leave for [place] will start on IM rocephin, Nebs TID, O2 to keep sats > 90.  Adrenal insufficiency Stress dose cortisone po for several days then back to usual dose.   Time spent 35 min;> 50% of time with patient was spent reviewing records, labs, tests and studies, counseling and developing plan of care  Margit Hanks, MD

## 2015-01-02 NOTE — Assessment & Plan Note (Addendum)
H/o aspiration PNA , tx with rocephin (PCN allergic), gets septic 2-3 times yearly, 5 months since last. Pt goes down fast. CXR and CBC, BMP ordered. Nothing back - Pt staredt on IM rocephin, Nebs TID, O2 to keep sats > 90.

## 2015-01-06 DIAGNOSIS — M79672 Pain in left foot: Secondary | ICD-10-CM | POA: Diagnosis not present

## 2015-01-06 DIAGNOSIS — M79671 Pain in right foot: Secondary | ICD-10-CM | POA: Diagnosis not present

## 2015-01-06 DIAGNOSIS — B351 Tinea unguium: Secondary | ICD-10-CM | POA: Diagnosis not present

## 2015-01-10 NOTE — Assessment & Plan Note (Signed)
Stress dose cortisone po for several days then back to usual dose.

## 2015-02-02 DIAGNOSIS — I10 Essential (primary) hypertension: Secondary | ICD-10-CM | POA: Diagnosis not present

## 2015-03-17 ENCOUNTER — Encounter: Payer: Self-pay | Admitting: Internal Medicine

## 2015-03-17 ENCOUNTER — Non-Acute Institutional Stay (SKILLED_NURSING_FACILITY): Payer: Medicare Other | Admitting: Internal Medicine

## 2015-03-17 DIAGNOSIS — K2100 Gastro-esophageal reflux disease with esophagitis, without bleeding: Secondary | ICD-10-CM

## 2015-03-17 DIAGNOSIS — D509 Iron deficiency anemia, unspecified: Secondary | ICD-10-CM

## 2015-03-17 DIAGNOSIS — K219 Gastro-esophageal reflux disease without esophagitis: Secondary | ICD-10-CM | POA: Insufficient documentation

## 2015-03-17 DIAGNOSIS — R569 Unspecified convulsions: Secondary | ICD-10-CM | POA: Diagnosis not present

## 2015-03-17 HISTORY — DX: Gastro-esophageal reflux disease without esophagitis: K21.9

## 2015-03-17 NOTE — Assessment & Plan Note (Signed)
Chronic and stable without seizures; plan cont dilantin 100 mg BID and obtain dilantin level

## 2015-03-17 NOTE — Assessment & Plan Note (Signed)
Chronic and stable;last Hb/HCT was 01/2015 10.4/32.4 with MCV 73; plan cont daily iron

## 2015-03-17 NOTE — Assessment & Plan Note (Signed)
Pt had EGD in 11/2014 which showed ulcers in esophagus,  And gastritis; bx taken form stomach and duodenum; pt was started on protonix 40 mg BID with f/u 3 months

## 2015-03-17 NOTE — Progress Notes (Signed)
MRN: [id number] Name: [name]  Sex: male Age: 59 y.o. DOB: [birth year]  [name] Facility/Room: Level Of Care: SNF Provider: Merrilee Seashore D Emergency Contacts: Extended Emergency Contact Information Primary Emergency Contact: Valtierra,[name] Address: [address]          [place], [place] [address] [place] Home Phone: [phone] Mobile Phone: [phone] Relation: Mother Secondary Emergency Contact: Valinda Party [place] Home Phone: [phone] Relation: Brother Father: Deamonte, Geske [place] Mobile Phone: [phone]  Code Status:   Allergies: Aspirin; Keflex; Nsaids; and Penicillins  Chief Complaint  Patient presents with  . Medical Management of Chronic Issues    HPI: Patient is 59 y.o. male with quadriplegia being seen for routine issues of GERD, anemia and seizures.   Past Medical History  Diagnosis Date  . Quadriplegia (HCC)   . Depression   . Anemia   . Parkinson's disease (HCC)   . Seizures (HCC)   . Adrenal insufficiency (HCC)   . Dysphagia 06/19/2013  . Aphasia 06/19/2013    Past Surgical History  Procedure Laterality Date  . Peg tube placement    . Peripherally inserted central catheter insertion    . Tonsillectomy  1962      Medication List       This list is accurate as of: 03/17/15 11:59 PM.  Always use your most recent med list.               acetaminophen 325 MG tablet  Commonly known as:  TYLENOL  Place 2 tablets (650 mg total) into feeding tube every 6 (six) hours as needed for fever (Give 2 tablets via g-tube every 6 hours as needed for a fever greater than 101 degrees F).     baclofen 10 MG tablet  Commonly known as:  LIORESAL  Place 1 tablet (10 mg total) into feeding tube 3 (three) times daily.     bisacodyl 10 MG suppository  Commonly known as:  DULCOLAX  Place 10 mg rectally daily as needed for moderate constipation. If constipation not relieved  by milk of magnesia give one 10 mg suppository in 24hours     CENTAMIN Liqd  5 mLs by PEG Tube route daily.     famotidine 20 MG tablet  Commonly known as:  PEPCID  Place 1 tablet (20 mg total) into feeding tube 2 (two) times daily. Give 1 tablet via peg tube twice daily     feeding supplement (OSMOLITE 1.5 CAL) Liqd  Place 1,000 mLs into feeding tube daily. At 55 cc/hr     ferrous sulfate 220 (44 FE) MG/5ML solution  Place 330 mg into feeding tube See admin instructions. Give 7.28ml  Per tube every morning     folic acid 1 MG tablet  Commonly known as:  FOLVITE  1 mg by PEG Tube route daily.     free water Soln  Place 250 mLs into feeding tube every 4 (four) hours.     hydrocortisone 5 mg/mL Susp  Commonly known as:  CORTEF  Place 5 mLs (25 mg total) into feeding tube daily at 6 (six) AM.     hydrocortisone 5 mg/mL Susp  Commonly known as:  CORTEF  Place 3 mLs (15 mg total) into feeding tube every evening.     loratadine 10 MG tablet  Commonly known as:  CLARITIN  10 mg by PEG Tube route daily.     magnesium hydroxide 400 MG/5ML suspension  Commonly known as:  MILK OF MAGNESIA  Take 30 mLs by mouth daily as needed for mild constipation.     metoCLOPramide 10 MG tablet  Commonly known as:  REGLAN  Place 1 tablet (10 mg total) into feeding tube 4 (four) times daily -  before meals and at bedtime. Via Peg tube     olopatadine 0.1 % ophthalmic solution  Commonly known as:  PATANOL  Place 1 drop into both eyes every morning.     pantoprazole 40 MG tablet  Commonly known as:  PROTONIX  Take 40 mg by mouth 2 (two) times daily.     phenytoin 125 MG/5ML suspension  Commonly known as:  DILANTIN  Place 4 mLs (100 mg total) into feeding tube 2 (two) times daily.     polyethylene glycol packet  Commonly known as:  MIRALAX / GLYCOLAX  Place 17 g into feeding tube daily.     polyvinyl alcohol 1.4 % ophthalmic solution  Commonly known as:  LIQUIFILM TEARS  Place 1 drop  into both eyes daily.     prednisoLONE 15 MG/5ML Soln  Commonly known as:  PRELONE  Take 4-6 mg by mouth 2 (two) times daily. Give 6 mg every morning and 4 mg every evening     RA SALINE ENEMA 19-7 GM/118ML Enem  Place 1 each rectally as needed (for constipation).     saccharomyces boulardii 250 MG capsule  Commonly known as:  FLORASTOR  250 mg by PEG Tube route 2 (two) times daily.        No orders of the defined types were placed in this encounter.    Immunization History  Administered Date(s) Administered  . Influenza Whole 12/31/2012  . Influenza-Unspecified 01/07/2014  . PPD Test 08/22/2008  . Pneumococcal-Unspecified 06/23/2010    Social History  Substance Use Topics  . Smoking status: Never Smoker   . Smokeless tobacco: Never Used  . Alcohol Use: No    Review of Systems  DATA OBTAINED: from nursing - no concerns; pt with aphasia GENERAL:  no fevers, fatigue, appetite changes SKIN: No itching, rash HEENT: No complaint RESPIRATORY: No cough, wheezing, SOB CARDIAC: No chest pain, palpitations, lower extremity edema  GI: No abdominal pain, No N/V/D or constipation, No heartburn or reflux  GU: No dysuria, frequency or urgency, or incontinence  MUSCULOSKELETAL: No unrelieved bone/joint pain NEUROLOGIC: No headache, dizziness  PSYCHIATRIC: No overt anxiety or sadness  Filed Vitals:   03/17/15 1408  BP: 118/73  Pulse: 64  Temp: 98 F (36.7 C)  Resp: 18    Physical Exam  GENERAL APPEARANCE: Alert,aphasic, No acute distress  SKIN: No diaphoresis rash, or wounds HEENT: Unremarkable RESPIRATORY: Breathing is even, unlabored. Lung sounds are clear   CARDIOVASCULAR: Heart RRR no murmurs, rubs or gallops. No peripheral edema  GASTROINTESTINAL: Abdomen is soft, non-tender, not distended w/ normal bowel sounds.  GENITOURINARY: Bladder non tender, not distended  MUSCULOSKELETAL: wasting and contractures NEUROLOGIC: Cranial nerves 2-12 grossly intact; functional  quadriplegia-no movement of LE, some movement BUE PSYCHIATRIC: Mood and affect appropriate to situation, no behavioral issues  Patient Active Problem List   Diagnosis Date Noted  . GERD (gastroesophageal reflux disease) 03/17/2015  . Tinea cruris 09/10/2014  . Aspiration pneumonia (HCC) 08/15/2014  . Abscess, prostate 08/15/2014  . Pyelonephritis 08/06/2014  . Nausea & vomiting   . Fever presenting with conditions classified elsewhere 03/16/2014  . Altered mental status 03/16/2014  . Acute bronchiolitis due to unspecified organism 03/11/2014  . Enteritis  due to Clostridium difficile 03/11/2014  . Type 2 diabetes mellitus without complication (HCC) 03/11/2014  . Septic shock (HCC) 01/26/2014  . Sepsis (HCC) 01/25/2014  . Acute encephalopathy 01/02/2014  . Acute respiratory failure with hypercapnia (HCC) 01/02/2014  . Chronic anemia 01/02/2014  . Subdural hematoma (HCC) 01/02/2014  . Pain in thoracic spine 12/26/2013  . PEG (percutaneous endoscopic gastrostomy) status (HCC) 10/04/2013  . Rash and nonspecific skin eruption 07/08/2013  . Thrush 07/08/2013  . Heme positive stool 07/07/2013  . Severe sepsis (HCC) 06/23/2013  . E. coli pyelonephritis 06/22/2013  . SIRS (systemic inflammatory response syndrome) (HCC) 06/19/2013  . Leukocytosis 06/19/2013  . Bronchitis 06/19/2013  . Dysphagia 06/19/2013  . Aphasia 06/19/2013  . Other convulsions 08/10/2012  . Iron deficiency anemia 08/10/2012  . Glucocorticoid deficiency (HCC) 08/10/2012  . Febrile illness, acute 06/15/2012  . UTI (lower urinary tract infection) 06/15/2012  . Hypokalemia 11/10/2011  . Diabetic gastroparesis (HCC) 11/08/2011  . Ileus (HCC) 11/08/2011  . Hyponatremia 11/08/2011  . Adrenal insufficiency (HCC) 11/08/2011  . Quadriplegia (HCC) 11/08/2011  . Parkinson's disease (HCC)   . Seizures (HCC)   . Depression     CBC    Component Value Date/Time   WBC 6.3 11/26/2014 0407   WBC 7.0 09/24/2013   RBC 4.33  11/26/2014 0407   HGB 10.2* 11/26/2014 0407   HCT 31.5* 11/26/2014 0407   PLT 295 11/26/2014 0407   MCV 72.7* 11/26/2014 0407   LYMPHSABS 2.2 11/26/2014 0407   MONOABS 0.8 11/26/2014 0407   EOSABS 0.4 11/26/2014 0407   BASOSABS 0.0 11/26/2014 0407    CMP     Component Value Date/Time   NA 131* 11/26/2014 0407   NA 134* 09/24/2013   K 3.8 11/26/2014 0407   CL 96* 11/26/2014 0407   CO2 27 11/26/2014 0407   GLUCOSE 93 11/26/2014 0407   BUN 11 11/26/2014 0407   BUN 18 09/24/2013   CREATININE 0.53* 11/26/2014 0407   CREATININE 0.5* 09/24/2013   CALCIUM 8.5* 11/26/2014 0407   PROT 7.7 11/26/2014 0407   ALBUMIN 3.0* 11/26/2014 0407   AST 25 11/26/2014 0407   ALT 12* 11/26/2014 0407   ALKPHOS 153* 11/26/2014 0407   BILITOT <0.1* 11/26/2014 0407   GFRNONAA >60 11/26/2014 0407   GFRAA >60 11/26/2014 0407    Assessment and Plan  GERD (gastroesophageal reflux disease) Pt had EGD in 11/2014 which showed ulcers in esophagus,  And gastritis; bx taken form stomach and duodenum; pt was started on protonix 40 mg BID with f/u 3 months  Iron deficiency anemia Chronic and stable;last Hb/HCT was 01/2015 10.4/32.4 with MCV 73; plan cont daily iron  Seizures Chronic and stable without seizures; plan cont dilantin 100 mg BID and obtain dilantin level    Margit Hanks, MD

## 2015-03-18 DIAGNOSIS — D72829 Elevated white blood cell count, unspecified: Secondary | ICD-10-CM | POA: Diagnosis not present

## 2015-03-24 DIAGNOSIS — B351 Tinea unguium: Secondary | ICD-10-CM | POA: Diagnosis not present

## 2015-03-24 DIAGNOSIS — M79672 Pain in left foot: Secondary | ICD-10-CM | POA: Diagnosis not present

## 2015-03-24 DIAGNOSIS — M79671 Pain in right foot: Secondary | ICD-10-CM | POA: Diagnosis not present

## 2015-04-09 DIAGNOSIS — Z431 Encounter for attention to gastrostomy: Secondary | ICD-10-CM | POA: Diagnosis not present

## 2015-04-28 DIAGNOSIS — Z9889 Other specified postprocedural states: Secondary | ICD-10-CM | POA: Diagnosis not present

## 2015-04-28 DIAGNOSIS — Z993 Dependence on wheelchair: Secondary | ICD-10-CM | POA: Diagnosis not present

## 2015-04-28 DIAGNOSIS — K449 Diaphragmatic hernia without obstruction or gangrene: Secondary | ICD-10-CM | POA: Diagnosis not present

## 2015-04-28 DIAGNOSIS — G20 Parkinson's disease: Secondary | ICD-10-CM | POA: Diagnosis not present

## 2015-04-28 DIAGNOSIS — Z931 Gastrostomy status: Secondary | ICD-10-CM | POA: Diagnosis not present

## 2015-04-28 DIAGNOSIS — E119 Type 2 diabetes mellitus without complications: Secondary | ICD-10-CM | POA: Diagnosis not present

## 2015-04-28 DIAGNOSIS — R1310 Dysphagia, unspecified: Secondary | ICD-10-CM | POA: Diagnosis not present

## 2015-04-28 DIAGNOSIS — K219 Gastro-esophageal reflux disease without esophagitis: Secondary | ICD-10-CM | POA: Diagnosis not present

## 2015-04-28 DIAGNOSIS — G8250 Quadriplegia, unspecified: Secondary | ICD-10-CM | POA: Diagnosis not present

## 2015-04-28 DIAGNOSIS — Z09 Encounter for follow-up examination after completed treatment for conditions other than malignant neoplasm: Secondary | ICD-10-CM | POA: Diagnosis not present

## 2015-04-28 DIAGNOSIS — K2210 Ulcer of esophagus without bleeding: Secondary | ICD-10-CM | POA: Diagnosis not present

## 2015-04-28 DIAGNOSIS — Z79899 Other long term (current) drug therapy: Secondary | ICD-10-CM | POA: Diagnosis not present

## 2015-04-28 DIAGNOSIS — Z8719 Personal history of other diseases of the digestive system: Secondary | ICD-10-CM | POA: Diagnosis not present

## 2015-04-28 DIAGNOSIS — Z8711 Personal history of peptic ulcer disease: Secondary | ICD-10-CM | POA: Diagnosis not present

## 2015-05-01 DIAGNOSIS — I10 Essential (primary) hypertension: Secondary | ICD-10-CM | POA: Diagnosis not present

## 2015-05-01 LAB — CBC AND DIFFERENTIAL
HCT: 32 % — AB (ref 41–53)
HCT: 32 % — AB (ref 41–53)
HEMOGLOBIN: 10.3 g/dL — AB (ref 13.5–17.5)
Hemoglobin: 10.3 g/dL — AB (ref 13.5–17.5)
PLATELETS: 195 10*3/uL (ref 150–399)
Platelets: 195 10*3/uL (ref 150–399)
WBC: 5 10*3/mL
WBC: 6 10*3/mL

## 2015-05-21 DIAGNOSIS — G20 Parkinson's disease: Secondary | ICD-10-CM | POA: Diagnosis not present

## 2015-05-21 DIAGNOSIS — M24531 Contracture, right wrist: Secondary | ICD-10-CM | POA: Diagnosis not present

## 2015-05-21 DIAGNOSIS — M24532 Contracture, left wrist: Secondary | ICD-10-CM | POA: Diagnosis not present

## 2015-05-22 DIAGNOSIS — D126 Benign neoplasm of colon, unspecified: Secondary | ICD-10-CM | POA: Insufficient documentation

## 2015-05-22 DIAGNOSIS — R911 Solitary pulmonary nodule: Secondary | ICD-10-CM

## 2015-05-22 DIAGNOSIS — G20 Parkinson's disease: Secondary | ICD-10-CM | POA: Diagnosis not present

## 2015-05-22 DIAGNOSIS — M24532 Contracture, left wrist: Secondary | ICD-10-CM | POA: Diagnosis not present

## 2015-05-22 DIAGNOSIS — M24531 Contracture, right wrist: Secondary | ICD-10-CM | POA: Diagnosis not present

## 2015-05-22 DIAGNOSIS — Z860100 Personal history of colon polyps, unspecified: Secondary | ICD-10-CM

## 2015-05-22 DIAGNOSIS — R748 Abnormal levels of other serum enzymes: Secondary | ICD-10-CM | POA: Insufficient documentation

## 2015-05-22 HISTORY — DX: Solitary pulmonary nodule: R91.1

## 2015-05-22 HISTORY — DX: Personal history of colonic polyps: Z86.010

## 2015-05-22 HISTORY — DX: Personal history of colon polyps, unspecified: Z86.0100

## 2015-05-23 DIAGNOSIS — M24532 Contracture, left wrist: Secondary | ICD-10-CM | POA: Diagnosis not present

## 2015-05-23 DIAGNOSIS — G20 Parkinson's disease: Secondary | ICD-10-CM | POA: Diagnosis not present

## 2015-05-23 DIAGNOSIS — M24531 Contracture, right wrist: Secondary | ICD-10-CM | POA: Diagnosis not present

## 2015-05-24 DIAGNOSIS — G20 Parkinson's disease: Secondary | ICD-10-CM | POA: Diagnosis not present

## 2015-05-24 DIAGNOSIS — M24532 Contracture, left wrist: Secondary | ICD-10-CM | POA: Diagnosis not present

## 2015-05-24 DIAGNOSIS — M24531 Contracture, right wrist: Secondary | ICD-10-CM | POA: Diagnosis not present

## 2015-05-25 DIAGNOSIS — G20 Parkinson's disease: Secondary | ICD-10-CM | POA: Diagnosis not present

## 2015-05-25 DIAGNOSIS — M24532 Contracture, left wrist: Secondary | ICD-10-CM | POA: Diagnosis not present

## 2015-05-25 DIAGNOSIS — M24531 Contracture, right wrist: Secondary | ICD-10-CM | POA: Diagnosis not present

## 2015-05-26 DIAGNOSIS — G20 Parkinson's disease: Secondary | ICD-10-CM | POA: Diagnosis not present

## 2015-05-26 DIAGNOSIS — H04123 Dry eye syndrome of bilateral lacrimal glands: Secondary | ICD-10-CM | POA: Diagnosis not present

## 2015-05-26 DIAGNOSIS — Z7952 Long term (current) use of systemic steroids: Secondary | ICD-10-CM | POA: Diagnosis not present

## 2015-05-26 DIAGNOSIS — M24532 Contracture, left wrist: Secondary | ICD-10-CM | POA: Diagnosis not present

## 2015-05-26 DIAGNOSIS — H1013 Acute atopic conjunctivitis, bilateral: Secondary | ICD-10-CM | POA: Diagnosis not present

## 2015-05-26 DIAGNOSIS — H25813 Combined forms of age-related cataract, bilateral: Secondary | ICD-10-CM | POA: Diagnosis not present

## 2015-05-26 DIAGNOSIS — M24531 Contracture, right wrist: Secondary | ICD-10-CM | POA: Diagnosis not present

## 2015-05-26 LAB — HM DIABETES EYE EXAM

## 2015-05-27 DIAGNOSIS — M24531 Contracture, right wrist: Secondary | ICD-10-CM | POA: Diagnosis not present

## 2015-05-27 DIAGNOSIS — M24532 Contracture, left wrist: Secondary | ICD-10-CM | POA: Diagnosis not present

## 2015-05-27 DIAGNOSIS — G20 Parkinson's disease: Secondary | ICD-10-CM | POA: Diagnosis not present

## 2015-05-28 DIAGNOSIS — M24532 Contracture, left wrist: Secondary | ICD-10-CM | POA: Diagnosis not present

## 2015-05-28 DIAGNOSIS — M24531 Contracture, right wrist: Secondary | ICD-10-CM | POA: Diagnosis not present

## 2015-05-28 DIAGNOSIS — G20 Parkinson's disease: Secondary | ICD-10-CM | POA: Diagnosis not present

## 2015-05-29 ENCOUNTER — Encounter: Payer: Self-pay | Admitting: Internal Medicine

## 2015-05-29 ENCOUNTER — Non-Acute Institutional Stay (SKILLED_NURSING_FACILITY): Payer: Medicare Other | Admitting: Internal Medicine

## 2015-05-29 DIAGNOSIS — M24531 Contracture, right wrist: Secondary | ICD-10-CM | POA: Diagnosis not present

## 2015-05-29 DIAGNOSIS — R197 Diarrhea, unspecified: Secondary | ICD-10-CM | POA: Diagnosis not present

## 2015-05-29 DIAGNOSIS — G20 Parkinson's disease: Secondary | ICD-10-CM | POA: Diagnosis not present

## 2015-05-29 DIAGNOSIS — E119 Type 2 diabetes mellitus without complications: Secondary | ICD-10-CM | POA: Diagnosis not present

## 2015-05-29 DIAGNOSIS — E2740 Unspecified adrenocortical insufficiency: Secondary | ICD-10-CM

## 2015-05-29 DIAGNOSIS — Z931 Gastrostomy status: Secondary | ICD-10-CM | POA: Diagnosis not present

## 2015-05-29 DIAGNOSIS — M24532 Contracture, left wrist: Secondary | ICD-10-CM | POA: Diagnosis not present

## 2015-05-29 NOTE — Progress Notes (Deleted)
Location:  [facility] Room Number: 214-D  Place of Service:  SNF [phone]) Provider: Inocencio Homes  No primary care provider on file.  No care team member to display  Extended Emergency Contact Information Primary Emergency Contact: Chatmon,[name] Address: [address]          [place], [place] [address] [name] Phone: [phone] Mobile Phone: [phone] Relation: Mother Secondary Emergency Contact: Constance Haw [place] Phone: [phone] Relation: Brother Father: Marat, Wengel [place] Phone: [phone]  Code Status:  *** Goals of care: Advanced Directive information Advanced Directives 11/26/2014  Does patient have an advance directive? No  Type of Advance Directive -  Does patient want to make changes to advanced directive? -  Would patient like information on creating an advanced directive? No - patient declined information     Chief Complaint  Patient presents with  . Medical Management of Chronic Issues    HPI:  Pt is a 60 y.o. male seen today for medical management of chronic diseases.     Past Medical History  Diagnosis Date  . Quadriplegia ([facility])   . Depression   . Anemia   . Parkinson's disease ([facility])   . Seizures ([facility])   . Adrenal insufficiency ([facility])   . Dysphagia 06/19/2013  . Aphasia 06/19/2013   Past Surgical History  Procedure Laterality Date  . Peg tube placement    . Peripherally inserted central catheter insertion    . Tonsillectomy  1962    Allergies  Allergen Reactions  . Aspirin Other (See Comments)    Unknown reaction; "blood doesn't clog too well" per mother.   . Keflex [Cephalexin]     Rash  . Nsaids Other (See Comments)    Unknown reaction  . Penicillins Hives and Other (See Comments)    Unknown reaction.  Pt has tolerated cephalosporins in the past.      Medication List       This list is accurate as of:  05/29/15 12:56 PM.  Always use your most recent med list.               acetaminophen 325 MG tablet  Commonly known as:  TYLENOL  Place 2 tablets (650 mg total) into feeding tube every 6 (six) hours as needed for fever (Give 2 tablets via g-tube every 6 hours as needed for a fever greater than 101 degrees F).     baclofen 10 MG tablet  Commonly known as:  LIORESAL  Place 1 tablet (10 mg total) into feeding tube 3 (three) times daily.     bisacodyl 10 MG suppository  Commonly known as:  DULCOLAX  Place 10 mg rectally daily as needed for moderate constipation. If constipation not relieved by milk of magnesia give one 10 mg suppository in 24hours     CENTAMIN Liqd  5 mLs by PEG Tube route daily.     diphenhydrAMINE 25 MG tablet  Commonly known as:  BENADRYL  Take 25 mg by mouth every 6 (six) hours as needed for itching or allergies.     famotidine 20 MG tablet  Commonly known as:  PEPCID  Place 1 tablet (20 mg total) into feeding tube 2 (two) times daily. Give 1 tablet via peg tube twice daily     ferrous sulfate 220 (44 Fe) MG/5ML solution  Place 330 mg into feeding tube See admin instructions. Give 7.2ml  Per tube every  morning     folic acid 1 MG tablet  Commonly known as:  FOLVITE  1 mg by PEG Tube route daily.     loratadine 10 MG tablet  Commonly known as:  CLARITIN  10 mg by PEG Tube route daily.     magnesium hydroxide 400 MG/5ML suspension  Commonly known as:  MILK OF MAGNESIA  Take 30 mLs by mouth daily as needed for mild constipation.     metoCLOPramide 10 MG tablet  Commonly known as:  REGLAN  Place 1 tablet (10 mg total) into feeding tube 4 (four) times daily -  before meals and at bedtime. Via Peg tube     olopatadine 0.1 % ophthalmic solution  Commonly known as:  PATANOL  Place 1 drop into both eyes every morning.     pantoprazole 40 MG tablet  Commonly known as:  PROTONIX  Take 40 mg by mouth 2 (two) times daily.     phenytoin 125 MG/5ML suspension    Commonly known as:  DILANTIN  Place 4 mLs (100 mg total) into feeding tube 2 (two) times daily.     polyethylene glycol packet  Commonly known as:  MIRALAX / GLYCOLAX  Place 17 g into feeding tube daily.     polyvinyl alcohol 1.4 % ophthalmic solution  Commonly known as:  LIQUIFILM TEARS  Place 1 drop into both eyes daily.     prednisoLONE 15 MG/5ML Soln  Commonly known as:  PRELONE  Take 4-6 mg by mouth 2 (two) times daily. Give 6 mg every morning and 4 mg every evening     RA SALINE ENEMA 19-7 GM/118ML Enem  Place 1 each rectally as needed (for constipation).     saccharomyces boulardii 250 MG capsule  Commonly known as:  FLORASTOR  250 mg by PEG Tube route 2 (two) times daily.        Review of Systems  Immunization History  Administered Date(s) Administered  . Influenza Whole 12/31/2012  . Influenza-Unspecified 01/07/2014  . PPD Test 08/22/2008  . Pneumococcal-Unspecified 06/23/2010   Pertinent  Health Maintenance Due  Topic Date Due  . URINE MICROALBUMIN  09/18/1966  . COLONOSCOPY  09/18/2006  . INFLUENZA VACCINE  10/27/2014  . HEMOGLOBIN A1C  02/07/2015  . FOOT EXAM  06/16/2015  . OPHTHALMOLOGY EXAM  05/25/2016   No flowsheet data found. Functional Status Survey:    Filed Vitals:   05/29/15 1225  BP: 118/73  Pulse: 88  Temp: 98 F (36.7 C)  TempSrc: Oral  Resp: 16  Weight: 137 lb 3.2 oz (62.234 kg)   Body mass index is 20.87 kg/(m^2). Physical Exam  Labs reviewed:  Recent Labs  08/12/14 0600 08/12/14 1150 08/13/14 0551 11/26/14 0407  NA 137 139 139 131*  K 2.5* 3.3* 3.3* 3.8  CL 94* 96* 97* 96*  CO2 33* 32 31 27  GLUCOSE 101* 103* 98 93  BUN 8 7 10 11   CREATININE 0.51* 0.47* 0.51* 0.53*  CALCIUM 8.4* 8.2* 8.6* 8.5*  MG 2.0  --   --   --     Recent Labs  08/07/14 0545 08/10/14 0530 11/26/14 0407  AST 44* 30 25  ALT 37 25 12*  ALKPHOS 116 85 153*  BILITOT 0.4 0.4 <0.1*  PROT 7.4 7.0 7.7  ALBUMIN 3.1* 2.6* 3.0*    Recent  Labs  08/06/14 1719 08/07/14 0545  08/10/14 0530 08/11/14 0545 11/26/14 0407 05/01/15  WBC 8.7 9.6  < > 3.5* 7.1 6.3 5.0  NEUTROABS 6.2 8.1*  --   --   --  2.9  --   HGB 11.7* 10.2*  < > 9.8* 9.9* 10.2* 10.3*  HCT 36.2* 32.8*  < > 30.0* 31.2* 31.5* 32*  MCV 73.7* 74.0*  < > 72.1* 72.2* 72.7*  --   PLT 223 185  < > 161 175 295 195  < > = values in this interval not displayed. Lab Results  Component Value Date   TSH 2.790 01/29/2014   Lab Results  Component Value Date   HGBA1C 5.3 08/07/2014   No results found for: CHOL, HDL, LDLCALC, LDLDIRECT, TRIG, CHOLHDL  Significant Diagnostic Results in last 30 days:  No results found.  Assessment/Plan There are no diagnoses linked to this encounter.   Family/ staff Communication: ***  Labs/tests ordered:  ***

## 2015-05-30 ENCOUNTER — Encounter: Payer: Self-pay | Admitting: Internal Medicine

## 2015-05-30 DIAGNOSIS — G20 Parkinson's disease: Secondary | ICD-10-CM | POA: Diagnosis not present

## 2015-05-30 DIAGNOSIS — M24532 Contracture, left wrist: Secondary | ICD-10-CM | POA: Diagnosis not present

## 2015-05-30 DIAGNOSIS — M24531 Contracture, right wrist: Secondary | ICD-10-CM | POA: Diagnosis not present

## 2015-05-30 NOTE — Assessment & Plan Note (Signed)
Continue replacement with prelone 6 mg in am and 4 mg in afternoon

## 2015-05-30 NOTE — Assessment & Plan Note (Signed)
Well controlled on diet alone which is totally controlled by tiube feedings ;A1c was 5.3;time to order new labs

## 2015-05-30 NOTE — Progress Notes (Signed)
MRN: [id number] Name: [name]  Sex: male Age: 60 y.o. DOB: 1955/04/13  [name] Level Of Care: SNF Provider: Merrilee Seashore D Emergency Contacts: Extended Emergency Contact Information Primary Emergency Contact: Whitter,[name] Address: [address]          [place], [place] [address] [place] Home Phone: [phone] Mobile Phone: [phone] Relation: Mother Secondary Emergency Contact: Valinda Party [place] Home Phone: [phone] Relation: Brother Father: Chet, Uchida [place] Mobile Phone: [phone]  Code Status:   Allergies: Aspirin; Keflex; Nsaids; and Penicillins  Chief Complaint  Patient presents with  . Medical Management of Chronic Issues    HPI: Patient is 60 y.o. male who is a quadriplegic who is being seen for routine issues of adrenal insufficiency, DM and diarrea.  Past Medical History  Diagnosis Date  . Quadriplegia (HCC)   . Depression   . Anemia   . Parkinson's disease (HCC)   . Seizures (HCC)   . Adrenal insufficiency (HCC)   . Dysphagia 06/19/2013  . Aphasia 06/19/2013    Past Surgical History  Procedure Laterality Date  . Peg tube placement    . Peripherally inserted central catheter insertion    . Tonsillectomy  1962      Medication List       This list is accurate as of: 05/29/15 11:59 PM.  Always use your most recent med list.               acetaminophen 325 MG tablet  Commonly known as:  TYLENOL  Place 2 tablets (650 mg total) into feeding tube every 6 (six) hours as needed for fever (Give 2 tablets via g-tube every 6 hours as needed for a fever greater than 101 degrees F).     baclofen 10 MG tablet  Commonly known as:  LIORESAL  Place 1 tablet (10 mg total) into feeding tube 3 (three) times daily.     bisacodyl 10 MG suppository  Commonly known as:  DULCOLAX  Place 10 mg rectally daily as needed for moderate constipation. If  constipation not relieved by milk of magnesia give one 10 mg suppository in 24hours     CENTAMIN Liqd  5 mLs by PEG Tube route daily.     diphenhydrAMINE 25 MG tablet  Commonly known as:  BENADRYL  Take 25 mg by mouth every 6 (six) hours as needed for itching or allergies.     famotidine 20 MG tablet  Commonly known as:  PEPCID  Place 1 tablet (20 mg total) into feeding tube 2 (two) times daily. Give 1 tablet via peg tube twice daily     ferrous sulfate 220 (44 Fe) MG/5ML solution  Place 330 mg into feeding tube See admin instructions. Give 7.26ml  Per tube every morning     folic acid 1 MG tablet  Commonly known as:  FOLVITE  1 mg by PEG Tube route daily.     loratadine 10 MG tablet  Commonly known as:  CLARITIN  10 mg by PEG Tube route daily.     magnesium hydroxide 400 MG/5ML suspension  Commonly known as:  MILK OF MAGNESIA  Take 30 mLs by mouth daily as needed for mild constipation.     metoCLOPramide 10 MG tablet  Commonly known as:  REGLAN  Place 1 tablet (10 mg total) into feeding tube 4 (four) times daily -  before meals and at bedtime. Via Peg tube  olopatadine 0.1 % ophthalmic solution  Commonly known as:  PATANOL  Place 1 drop into both eyes every morning.     pantoprazole 40 MG tablet  Commonly known as:  PROTONIX  Take 40 mg by mouth 2 (two) times daily.     phenytoin 125 MG/5ML suspension  Commonly known as:  DILANTIN  Place 4 mLs (100 mg total) into feeding tube 2 (two) times daily.     polyethylene glycol packet  Commonly known as:  MIRALAX / GLYCOLAX  Place 17 g into feeding tube daily.     polyvinyl alcohol 1.4 % ophthalmic solution  Commonly known as:  LIQUIFILM TEARS  Place 1 drop into both eyes daily.     prednisoLONE 15 MG/5ML Soln  Commonly known as:  PRELONE  Take 4-6 mg by mouth 2 (two) times daily. Give 6 mg every morning and 4 mg every evening     RA SALINE ENEMA 19-7 GM/118ML Enem  Place 1 each rectally as needed (for  constipation).     saccharomyces boulardii 250 MG capsule  Commonly known as:  FLORASTOR  250 mg by PEG Tube route 2 (two) times daily.        No orders of the defined types were placed in this encounter.    Immunization History  Administered Date(s) Administered  . Influenza Whole 12/31/2012  . Influenza-Unspecified 01/07/2014  . PPD Test 08/22/2008  . Pneumococcal-Unspecified 06/23/2010    Social History  Substance Use Topics  . Smoking status: Never Smoker   . Smokeless tobacco: Never Used  . Alcohol Use: No    Review of Systems  DATA OBTAINED: from nurse GENERAL:  no fevers, fatigue, appetite changes SKIN: No itching, rash HEENT: No complaint RESPIRATORY: No cough, wheezing, SOB CARDIAC: No chest pain, palpitations, lower extremity edema  GI: No abdominal pain, No N/V/D or constipation, No heartburn or reflux  GU: No dysuria, frequency or urgency, or incontinence  MUSCULOSKELETAL: No unrelieved bone/joint pain NEUROLOGIC: No headache, dizziness  PSYCHIATRIC: No overt anxiety or sadness  Filed Vitals:   05/30/15 2018  BP: 118/73  Pulse: 88  Temp: 98 F (36.7 C)  Resp: 16    Physical Exam  GENERAL APPEARANCE: Alert, nonconversant, No acute distress  SKIN: No diaphoresis rash HEENT: Unremarkable RESPIRATORY: Breathing is even, unlabored. Lung sounds are clear   CARDIOVASCULAR: Heart RRR no murmurs, rubs or gallops. No peripheral edema  GASTROINTESTINAL: Abdomen is soft, non-tender, not distended w/ normal bowel sounds; PAG tube GENITOURINARY: Bladder non tender, not distended  MUSCULOSKELETAL: No abnormal joints or musculature NEUROLOGIC: non speaking quadriplegic who can communicate some with some movement of BUE PSYCHIATRIC: Mood and affect appropriate to situation, no behavioral issues  Patient Active Problem List   Diagnosis Date Noted  . GERD (gastroesophageal reflux disease) 03/17/2015  . Tinea cruris 09/10/2014  . Aspiration pneumonia (HCC)  08/15/2014  . Abscess, prostate 08/15/2014  . Pyelonephritis 08/06/2014  . Nausea & vomiting   . Fever presenting with conditions classified elsewhere 03/16/2014  . Altered mental status 03/16/2014  . Acute bronchiolitis due to unspecified organism 03/11/2014  . Enteritis due to Clostridium difficile 03/11/2014  . Type 2 diabetes mellitus without complication (HCC) 03/11/2014  . Septic shock (HCC) 01/26/2014  . Sepsis (HCC) 01/25/2014  . Acute encephalopathy 01/02/2014  . Acute respiratory failure with hypercapnia (HCC) 01/02/2014  . Chronic anemia 01/02/2014  . Subdural hematoma (HCC) 01/02/2014  . Pain in thoracic spine 12/26/2013  . PEG (percutaneous endoscopic gastrostomy) status (HCC)  10/04/2013  . Rash and nonspecific skin eruption 07/08/2013  . Thrush 07/08/2013  . Heme positive stool 07/07/2013  . Severe sepsis (HCC) 06/23/2013  . E. coli pyelonephritis 06/22/2013  . SIRS (systemic inflammatory response syndrome) (HCC) 06/19/2013  . Leukocytosis 06/19/2013  . Bronchitis 06/19/2013  . Dysphagia 06/19/2013  . Aphasia 06/19/2013  . Other convulsions 08/10/2012  . Iron deficiency anemia 08/10/2012  . Glucocorticoid deficiency (HCC) 08/10/2012  . Febrile illness, acute 06/15/2012  . UTI (lower urinary tract infection) 06/15/2012  . Hypokalemia 11/10/2011  . Diabetic gastroparesis (HCC) 11/08/2011  . Ileus (HCC) 11/08/2011  . Hyponatremia 11/08/2011  . Adrenal insufficiency (HCC) 11/08/2011  . Quadriplegia (HCC) 11/08/2011  . Parkinson's disease (HCC)   . Seizures (HCC)   . Depression     CBC    Component Value Date/Time   WBC 5.0 05/01/2015   WBC 6.3 11/26/2014 0407   RBC 4.33 11/26/2014 0407   HGB 10.3* 05/01/2015   HCT 32* 05/01/2015   PLT 195 05/01/2015   MCV 72.7* 11/26/2014 0407   LYMPHSABS 2.2 11/26/2014 0407   MONOABS 0.8 11/26/2014 0407   EOSABS 0.4 11/26/2014 0407   BASOSABS 0.0 11/26/2014 0407    CMP     Component Value Date/Time   NA 131*  11/26/2014 0407   NA 134* 09/24/2013   K 3.8 11/26/2014 0407   CL 96* 11/26/2014 0407   CO2 27 11/26/2014 0407   GLUCOSE 93 11/26/2014 0407   BUN 11 11/26/2014 0407   BUN 18 09/24/2013   CREATININE 0.53* 11/26/2014 0407   CREATININE 0.5* 09/24/2013   CALCIUM 8.5* 11/26/2014 0407   PROT 7.7 11/26/2014 0407   ALBUMIN 3.0* 11/26/2014 0407   AST 25 11/26/2014 0407   ALT 12* 11/26/2014 0407   ALKPHOS 153* 11/26/2014 0407   BILITOT <0.1* 11/26/2014 0407   GFRNONAA >60 11/26/2014 0407   GFRAA >60 11/26/2014 0407    Assessment and Plan  Adrenal insufficiency Continue replacement with prelone 6 mg in am and 4 mg in afternoon  Type 2 diabetes mellitus without complication Well controlled on diet alone which is totally controlled by tiube feedings ;A1c was 5.3;time to order new labs   DIARRHEA- pt had been having loose stools daily, no weight lose so timing of liron has been changed and diarrhea has improved  Margit Hanks, MD

## 2015-05-31 DIAGNOSIS — M24531 Contracture, right wrist: Secondary | ICD-10-CM | POA: Diagnosis not present

## 2015-05-31 DIAGNOSIS — G20 Parkinson's disease: Secondary | ICD-10-CM | POA: Diagnosis not present

## 2015-05-31 DIAGNOSIS — M24532 Contracture, left wrist: Secondary | ICD-10-CM | POA: Diagnosis not present

## 2015-06-01 DIAGNOSIS — M24531 Contracture, right wrist: Secondary | ICD-10-CM | POA: Diagnosis not present

## 2015-06-01 DIAGNOSIS — G20 Parkinson's disease: Secondary | ICD-10-CM | POA: Diagnosis not present

## 2015-06-01 DIAGNOSIS — M24532 Contracture, left wrist: Secondary | ICD-10-CM | POA: Diagnosis not present

## 2015-06-02 DIAGNOSIS — G20 Parkinson's disease: Secondary | ICD-10-CM | POA: Diagnosis not present

## 2015-06-02 DIAGNOSIS — M24532 Contracture, left wrist: Secondary | ICD-10-CM | POA: Diagnosis not present

## 2015-06-02 DIAGNOSIS — M24531 Contracture, right wrist: Secondary | ICD-10-CM | POA: Diagnosis not present

## 2015-06-03 DIAGNOSIS — M24531 Contracture, right wrist: Secondary | ICD-10-CM | POA: Diagnosis not present

## 2015-06-03 DIAGNOSIS — G20 Parkinson's disease: Secondary | ICD-10-CM | POA: Diagnosis not present

## 2015-06-03 DIAGNOSIS — M24532 Contracture, left wrist: Secondary | ICD-10-CM | POA: Diagnosis not present

## 2015-06-04 DIAGNOSIS — G20 Parkinson's disease: Secondary | ICD-10-CM | POA: Diagnosis not present

## 2015-06-04 DIAGNOSIS — M24532 Contracture, left wrist: Secondary | ICD-10-CM | POA: Diagnosis not present

## 2015-06-04 DIAGNOSIS — M24531 Contracture, right wrist: Secondary | ICD-10-CM | POA: Diagnosis not present

## 2015-06-05 DIAGNOSIS — M24532 Contracture, left wrist: Secondary | ICD-10-CM | POA: Diagnosis not present

## 2015-06-05 DIAGNOSIS — G20 Parkinson's disease: Secondary | ICD-10-CM | POA: Diagnosis not present

## 2015-06-05 DIAGNOSIS — M24531 Contracture, right wrist: Secondary | ICD-10-CM | POA: Diagnosis not present

## 2015-06-07 DIAGNOSIS — G20 Parkinson's disease: Secondary | ICD-10-CM | POA: Diagnosis not present

## 2015-06-07 DIAGNOSIS — M24531 Contracture, right wrist: Secondary | ICD-10-CM | POA: Diagnosis not present

## 2015-06-07 DIAGNOSIS — M24532 Contracture, left wrist: Secondary | ICD-10-CM | POA: Diagnosis not present

## 2015-06-08 DIAGNOSIS — M24531 Contracture, right wrist: Secondary | ICD-10-CM | POA: Diagnosis not present

## 2015-06-08 DIAGNOSIS — G20 Parkinson's disease: Secondary | ICD-10-CM | POA: Diagnosis not present

## 2015-06-08 DIAGNOSIS — M24532 Contracture, left wrist: Secondary | ICD-10-CM | POA: Diagnosis not present

## 2015-06-09 DIAGNOSIS — M24532 Contracture, left wrist: Secondary | ICD-10-CM | POA: Diagnosis not present

## 2015-06-09 DIAGNOSIS — M24531 Contracture, right wrist: Secondary | ICD-10-CM | POA: Diagnosis not present

## 2015-06-09 DIAGNOSIS — G20 Parkinson's disease: Secondary | ICD-10-CM | POA: Diagnosis not present

## 2015-06-10 DIAGNOSIS — G20 Parkinson's disease: Secondary | ICD-10-CM | POA: Diagnosis not present

## 2015-06-10 DIAGNOSIS — M24532 Contracture, left wrist: Secondary | ICD-10-CM | POA: Diagnosis not present

## 2015-06-10 DIAGNOSIS — M24531 Contracture, right wrist: Secondary | ICD-10-CM | POA: Diagnosis not present

## 2015-06-11 DIAGNOSIS — M24531 Contracture, right wrist: Secondary | ICD-10-CM | POA: Diagnosis not present

## 2015-06-11 DIAGNOSIS — M24532 Contracture, left wrist: Secondary | ICD-10-CM | POA: Diagnosis not present

## 2015-06-11 DIAGNOSIS — G20 Parkinson's disease: Secondary | ICD-10-CM | POA: Diagnosis not present

## 2015-06-12 DIAGNOSIS — M24532 Contracture, left wrist: Secondary | ICD-10-CM | POA: Diagnosis not present

## 2015-06-12 DIAGNOSIS — G20 Parkinson's disease: Secondary | ICD-10-CM | POA: Diagnosis not present

## 2015-06-12 DIAGNOSIS — M24531 Contracture, right wrist: Secondary | ICD-10-CM | POA: Diagnosis not present

## 2015-06-13 DIAGNOSIS — M24531 Contracture, right wrist: Secondary | ICD-10-CM | POA: Diagnosis not present

## 2015-06-13 DIAGNOSIS — M24532 Contracture, left wrist: Secondary | ICD-10-CM | POA: Diagnosis not present

## 2015-06-13 DIAGNOSIS — G20 Parkinson's disease: Secondary | ICD-10-CM | POA: Diagnosis not present

## 2015-06-14 DIAGNOSIS — G20 Parkinson's disease: Secondary | ICD-10-CM | POA: Diagnosis not present

## 2015-06-14 DIAGNOSIS — M24531 Contracture, right wrist: Secondary | ICD-10-CM | POA: Diagnosis not present

## 2015-06-14 DIAGNOSIS — M24532 Contracture, left wrist: Secondary | ICD-10-CM | POA: Diagnosis not present

## 2015-06-15 DIAGNOSIS — M24531 Contracture, right wrist: Secondary | ICD-10-CM | POA: Diagnosis not present

## 2015-06-15 DIAGNOSIS — M24532 Contracture, left wrist: Secondary | ICD-10-CM | POA: Diagnosis not present

## 2015-06-15 DIAGNOSIS — G20 Parkinson's disease: Secondary | ICD-10-CM | POA: Diagnosis not present

## 2015-06-16 DIAGNOSIS — M24531 Contracture, right wrist: Secondary | ICD-10-CM | POA: Diagnosis not present

## 2015-06-16 DIAGNOSIS — G20 Parkinson's disease: Secondary | ICD-10-CM | POA: Diagnosis not present

## 2015-06-16 DIAGNOSIS — M24532 Contracture, left wrist: Secondary | ICD-10-CM | POA: Diagnosis not present

## 2015-06-17 DIAGNOSIS — M24532 Contracture, left wrist: Secondary | ICD-10-CM | POA: Diagnosis not present

## 2015-06-17 DIAGNOSIS — G20 Parkinson's disease: Secondary | ICD-10-CM | POA: Diagnosis not present

## 2015-06-17 DIAGNOSIS — M24531 Contracture, right wrist: Secondary | ICD-10-CM | POA: Diagnosis not present

## 2015-06-18 DIAGNOSIS — B351 Tinea unguium: Secondary | ICD-10-CM | POA: Diagnosis not present

## 2015-06-18 DIAGNOSIS — M79671 Pain in right foot: Secondary | ICD-10-CM | POA: Diagnosis not present

## 2015-06-18 DIAGNOSIS — M24532 Contracture, left wrist: Secondary | ICD-10-CM | POA: Diagnosis not present

## 2015-06-18 DIAGNOSIS — M79672 Pain in left foot: Secondary | ICD-10-CM | POA: Diagnosis not present

## 2015-06-18 DIAGNOSIS — G20 Parkinson's disease: Secondary | ICD-10-CM | POA: Diagnosis not present

## 2015-06-18 DIAGNOSIS — M24531 Contracture, right wrist: Secondary | ICD-10-CM | POA: Diagnosis not present

## 2015-06-19 DIAGNOSIS — M24531 Contracture, right wrist: Secondary | ICD-10-CM | POA: Diagnosis not present

## 2015-06-19 DIAGNOSIS — G20 Parkinson's disease: Secondary | ICD-10-CM | POA: Diagnosis not present

## 2015-06-19 DIAGNOSIS — M24532 Contracture, left wrist: Secondary | ICD-10-CM | POA: Diagnosis not present

## 2015-06-20 DIAGNOSIS — M24531 Contracture, right wrist: Secondary | ICD-10-CM | POA: Diagnosis not present

## 2015-06-20 DIAGNOSIS — M24532 Contracture, left wrist: Secondary | ICD-10-CM | POA: Diagnosis not present

## 2015-06-20 DIAGNOSIS — G20 Parkinson's disease: Secondary | ICD-10-CM | POA: Diagnosis not present

## 2015-06-22 DIAGNOSIS — M24531 Contracture, right wrist: Secondary | ICD-10-CM | POA: Diagnosis not present

## 2015-06-22 DIAGNOSIS — M24532 Contracture, left wrist: Secondary | ICD-10-CM | POA: Diagnosis not present

## 2015-06-22 DIAGNOSIS — G20 Parkinson's disease: Secondary | ICD-10-CM | POA: Diagnosis not present

## 2015-06-23 DIAGNOSIS — G20 Parkinson's disease: Secondary | ICD-10-CM | POA: Diagnosis not present

## 2015-06-23 DIAGNOSIS — M24532 Contracture, left wrist: Secondary | ICD-10-CM | POA: Diagnosis not present

## 2015-06-23 DIAGNOSIS — M24531 Contracture, right wrist: Secondary | ICD-10-CM | POA: Diagnosis not present

## 2015-06-24 DIAGNOSIS — M24532 Contracture, left wrist: Secondary | ICD-10-CM | POA: Diagnosis not present

## 2015-06-24 DIAGNOSIS — G20 Parkinson's disease: Secondary | ICD-10-CM | POA: Diagnosis not present

## 2015-06-24 DIAGNOSIS — M24531 Contracture, right wrist: Secondary | ICD-10-CM | POA: Diagnosis not present

## 2015-06-25 DIAGNOSIS — M24531 Contracture, right wrist: Secondary | ICD-10-CM | POA: Diagnosis not present

## 2015-06-25 DIAGNOSIS — M24532 Contracture, left wrist: Secondary | ICD-10-CM | POA: Diagnosis not present

## 2015-06-25 DIAGNOSIS — G20 Parkinson's disease: Secondary | ICD-10-CM | POA: Diagnosis not present

## 2015-06-26 DIAGNOSIS — M24531 Contracture, right wrist: Secondary | ICD-10-CM | POA: Diagnosis not present

## 2015-06-26 DIAGNOSIS — M24532 Contracture, left wrist: Secondary | ICD-10-CM | POA: Diagnosis not present

## 2015-06-26 DIAGNOSIS — G20 Parkinson's disease: Secondary | ICD-10-CM | POA: Diagnosis not present

## 2015-06-30 NOTE — Progress Notes (Signed)
This encounter was created in error - please disregard.

## 2015-07-28 ENCOUNTER — Non-Acute Institutional Stay (SKILLED_NURSING_FACILITY): Payer: Medicare Other | Admitting: Internal Medicine

## 2015-07-28 ENCOUNTER — Encounter: Payer: Self-pay | Admitting: Internal Medicine

## 2015-07-28 DIAGNOSIS — R1310 Dysphagia, unspecified: Secondary | ICD-10-CM

## 2015-07-28 DIAGNOSIS — E1143 Type 2 diabetes mellitus with diabetic autonomic (poly)neuropathy: Secondary | ICD-10-CM

## 2015-07-28 DIAGNOSIS — K219 Gastro-esophageal reflux disease without esophagitis: Secondary | ICD-10-CM

## 2015-07-28 DIAGNOSIS — K3184 Gastroparesis: Secondary | ICD-10-CM | POA: Diagnosis not present

## 2015-07-28 NOTE — Assessment & Plan Note (Signed)
Controlled on reglan 4 times during his tube feeding

## 2015-07-28 NOTE — Assessment & Plan Note (Signed)
High risk from PEG feedings; cont protonix  40 mg and pepcid 20 mg dailyand

## 2015-07-28 NOTE — Progress Notes (Signed)
MRN: [id number] Name: [name]  Sex: male Age: 60 y.o. DOB: Jun 20, 1955  [name] Facility/Room:[id number] Level Of Care: SNF Provider: Merrilee Seashore D Emergency Contacts: Extended Emergency Contact Information Primary Emergency Contact: Martelli,[name] Address: [address]          [place], [place] [address] [place] Home Phone: [phone] Mobile Phone: [phone] Relation: Mother Secondary Emergency Contact: Valinda Party [place] Home Phone: [phone] Relation: Brother Father: [place], [place] Mobile Phone: [phone]  Code Status:   Allergies: Aspirin; Keflex; Nsaids; and Penicillins  Chief Complaint  Patient presents with  . Medical Management of Chronic Issues    HPI: Patient is 60 y.o. male quadriplegic who is being seen for routine issues of GERD, diabetic gastroparesis and dysphagis.   Past Medical History  Diagnosis Date  . Quadriplegia (HCC)   . Depression   . Anemia   . Parkinson's disease (HCC)   . Seizures (HCC)   . Adrenal insufficiency (HCC)   . Dysphagia 06/19/2013  . Aphasia 06/19/2013    Past Surgical History  Procedure Laterality Date  . Peg tube placement    . Peripherally inserted central catheter insertion    . Tonsillectomy  1962      Medication List       This list is accurate as of: 07/28/15  6:22 PM.  Always use your most recent med list.               acetaminophen 325 MG tablet  Commonly known as:  TYLENOL  Place 2 tablets (650 mg total) into feeding tube every 6 (six) hours as needed for fever (Give 2 tablets via g-tube every 6 hours as needed for a fever greater than 101 degrees F).     baclofen 10 MG tablet  Commonly known as:  LIORESAL  Place 1 tablet (10 mg total) into feeding tube 3 (three) times daily.     bisacodyl 10 MG suppository  Commonly known as:  DULCOLAX  Place 10 mg rectally daily as needed for moderate constipation. If  constipation not relieved by milk of magnesia give one 10 mg suppository in 24hours     CENTAMIN Liqd  5 mLs by PEG Tube route daily.     diphenhydrAMINE 25 MG tablet  Commonly known as:  BENADRYL  Take 25 mg by mouth every 6 (six) hours as needed for itching or allergies.     famotidine 20 MG tablet  Commonly known as:  PEPCID  Place 1 tablet (20 mg total) into feeding tube 2 (two) times daily. Give 1 tablet via peg tube twice daily     ferrous sulfate 220 (44 Fe) MG/5ML solution  Place 330 mg into feeding tube See admin instructions. Give 7.78ml  Per tube every morning     folic acid 1 MG tablet  Commonly known as:  FOLVITE  1 mg by PEG Tube route daily.     loratadine 10 MG tablet  Commonly known as:  CLARITIN  10 mg by PEG Tube route daily.     magnesium hydroxide 400 MG/5ML suspension  Commonly known as:  MILK OF MAGNESIA  Take 30 mLs by mouth daily as needed for mild constipation.     metoCLOPramide 10 MG tablet  Commonly known as:  REGLAN  Place 1 tablet (10 mg total) into feeding tube 4 (four) times daily -  before meals and at bedtime. Via Peg tube  olopatadine 0.1 % ophthalmic solution  Commonly known as:  PATANOL  Place 1 drop into both eyes every morning.     pantoprazole 40 MG tablet  Commonly known as:  PROTONIX  Take 40 mg by mouth 2 (two) times daily.     phenytoin 125 MG/5ML suspension  Commonly known as:  DILANTIN  Place 4 mLs (100 mg total) into feeding tube 2 (two) times daily.     polyethylene glycol packet  Commonly known as:  MIRALAX / GLYCOLAX  Place 17 g into feeding tube daily.     polyvinyl alcohol 1.4 % ophthalmic solution  Commonly known as:  LIQUIFILM TEARS  Place 1 drop into both eyes daily.     prednisoLONE 15 MG/5ML Soln  Commonly known as:  PRELONE  Take 4-6 mg by mouth 2 (two) times daily. Give 6 mg every morning and 4 mg every evening     RA SALINE ENEMA 19-7 GM/118ML Enem  Place 1 each rectally as needed (for  constipation).     saccharomyces boulardii 250 MG capsule  Commonly known as:  FLORASTOR  250 mg by PEG Tube route 2 (two) times daily.        No orders of the defined types were placed in this encounter.    Immunization History  Administered Date(s) Administered  . Influenza Whole 12/31/2012  . Influenza-Unspecified 01/07/2014  . PPD Test 08/22/2008  . Pneumococcal-Unspecified 06/23/2010    Social History  Substance Use Topics  . Smoking status: Never Smoker   . Smokeless tobacco: Never Used  . Alcohol Use: No    Review of Systems  DATA OBTAINED: from patient,  family member GENERAL:  no fevers, fatigue, appetite changes SKIN: No itching, rash HEENT: No complaint RESPIRATORY: No cough, wheezing, SOB CARDIAC: No chest pain, palpitations, lower extremity edema  GI: No abdominal pain, No N/V/D or constipation, No heartburn or reflux  GU: No dysuria, frequency or urgency, or incontinence  MUSCULOSKELETAL: No unrelieved bone/joint pain NEUROLOGIC: No headache, dizziness  PSYCHIATRIC: No overt anxiety or sadness  Filed Vitals:   07/28/15 1513  BP: 118/73  Pulse: 66  Temp: 96.9 F (36.1 C)  Resp: 18    Physical Exam  GENERAL APPEARANCE: Alert, non conversant, No acute distress , big smiles for family member SKIN: No diaphoresis rash HEENT: Unremarkable RESPIRATORY: Breathing is even, unlabored. Lung sounds are clear   CARDIOVASCULAR: Heart RRR no murmurs, rubs or gallops. No peripheral edema  GASTROINTESTINAL: Abdomen is soft, non-tender, not distended w/ normal bowel sounds.  GENITOURINARY: Bladder non tender, not distended  MUSCULOSKELETAL: wasting and contractures NEUROLOGIC: Cranial nerves 2-12 grossly intact, can't speak or swallow; some movement of BUE, no movement BLE PSYCHIATRIC: Mood and affect appropriate to situation, no behavioral issues  Patient Active Problem List   Diagnosis Date Noted  . GERD (gastroesophageal reflux disease) 03/17/2015  .  Tinea cruris 09/10/2014  . Aspiration pneumonia (HCC) 08/15/2014  . Abscess, prostate 08/15/2014  . Pyelonephritis 08/06/2014  . Nausea & vomiting   . Fever presenting with conditions classified elsewhere 03/16/2014  . Altered mental status 03/16/2014  . Acute bronchiolitis due to unspecified organism 03/11/2014  . Enteritis due to Clostridium difficile 03/11/2014  . Type 2 diabetes mellitus without complication (HCC) 03/11/2014  . Septic shock (HCC) 01/26/2014  . Sepsis (HCC) 01/25/2014  . Acute encephalopathy 01/02/2014  . Acute respiratory failure with hypercapnia (HCC) 01/02/2014  . Chronic anemia 01/02/2014  . Subdural hematoma (HCC) 01/02/2014  . Pain in thoracic  spine 12/26/2013  . PEG (percutaneous endoscopic gastrostomy) status (HCC) 10/04/2013  . Rash and nonspecific skin eruption 07/08/2013  . Thrush 07/08/2013  . Heme positive stool 07/07/2013  . Severe sepsis (HCC) 06/23/2013  . E. coli pyelonephritis 06/22/2013  . SIRS (systemic inflammatory response syndrome) (HCC) 06/19/2013  . Leukocytosis 06/19/2013  . Bronchitis 06/19/2013  . Dysphagia 06/19/2013  . Aphasia 06/19/2013  . Other convulsions 08/10/2012  . Iron deficiency anemia 08/10/2012  . Glucocorticoid deficiency (HCC) 08/10/2012  . Febrile illness, acute 06/15/2012  . UTI (lower urinary tract infection) 06/15/2012  . Hypokalemia 11/10/2011  . Diabetic gastroparesis (HCC) 11/08/2011  . Ileus (HCC) 11/08/2011  . Hyponatremia 11/08/2011  . Adrenal insufficiency (HCC) 11/08/2011  . Quadriplegia (HCC) 11/08/2011  . Parkinson's disease (HCC)   . Seizures (HCC)   . Depression     CBC    Component Value Date/Time   WBC 5.0 05/01/2015   WBC 6.3 11/26/2014 0407   RBC 4.33 11/26/2014 0407   HGB 10.3* 05/01/2015   HCT 32* 05/01/2015   PLT 195 05/01/2015   MCV 72.7* 11/26/2014 0407   LYMPHSABS 2.2 11/26/2014 0407   MONOABS 0.8 11/26/2014 0407   EOSABS 0.4 11/26/2014 0407   BASOSABS 0.0 11/26/2014 0407     CMP     Component Value Date/Time   NA 131* 11/26/2014 0407   NA 134* 09/24/2013   K 3.8 11/26/2014 0407   CL 96* 11/26/2014 0407   CO2 27 11/26/2014 0407   GLUCOSE 93 11/26/2014 0407   BUN 11 11/26/2014 0407   BUN 18 09/24/2013   CREATININE 0.53* 11/26/2014 0407   CREATININE 0.5* 09/24/2013   CALCIUM 8.5* 11/26/2014 0407   PROT 7.7 11/26/2014 0407   ALBUMIN 3.0* 11/26/2014 0407   AST 25 11/26/2014 0407   ALT 12* 11/26/2014 0407   ALKPHOS 153* 11/26/2014 0407   BILITOT <0.1* 11/26/2014 0407   GFRNONAA >60 11/26/2014 0407   GFRAA >60 11/26/2014 0407    Assessment and Plan  GERD (gastroesophageal reflux disease) High risk from PEG feedings; cont protonix  40 mg and pepcid 20 mg dailyand   Diabetic gastroparesis Controlled on reglan 4 times during his tube feeding  Dysphagia From prior cerebral hemorrhage ;NPO permanently; chronic PEG feedings    Margit Hanks, MD

## 2015-07-28 NOTE — Assessment & Plan Note (Addendum)
From prior cerebral hemorrhage ;NPO permanently; chronic PEG feedings

## 2015-07-31 DIAGNOSIS — E785 Hyperlipidemia, unspecified: Secondary | ICD-10-CM | POA: Diagnosis not present

## 2015-07-31 DIAGNOSIS — E039 Hypothyroidism, unspecified: Secondary | ICD-10-CM | POA: Diagnosis not present

## 2015-07-31 DIAGNOSIS — D638 Anemia in other chronic diseases classified elsewhere: Secondary | ICD-10-CM | POA: Diagnosis not present

## 2015-07-31 DIAGNOSIS — I10 Essential (primary) hypertension: Secondary | ICD-10-CM | POA: Diagnosis not present

## 2015-07-31 DIAGNOSIS — R569 Unspecified convulsions: Secondary | ICD-10-CM | POA: Diagnosis not present

## 2015-07-31 DIAGNOSIS — Z125 Encounter for screening for malignant neoplasm of prostate: Secondary | ICD-10-CM | POA: Diagnosis not present

## 2015-08-03 LAB — BASIC METABOLIC PANEL
BUN: 18 mg/dL (ref 4–21)
CREATININE: 0.6 mg/dL (ref 0.6–1.3)
Glucose: 96 mg/dL
Potassium: 4.5 mmol/L (ref 3.4–5.3)
Sodium: 140 mmol/L (ref 137–147)

## 2015-08-03 LAB — HEPATIC FUNCTION PANEL
ALT: 15 U/L (ref 10–40)
AST: 24 U/L (ref 14–40)

## 2015-08-03 LAB — TSH: TSH: 1.35 u[IU]/mL (ref 0.41–5.90)

## 2015-08-03 LAB — LIPID PANEL
Cholesterol: 150 mg/dL (ref 0–200)
HDL: 69 mg/dL (ref 35–70)

## 2015-08-07 DIAGNOSIS — Z125 Encounter for screening for malignant neoplasm of prostate: Secondary | ICD-10-CM | POA: Diagnosis not present

## 2015-08-07 DIAGNOSIS — Z131 Encounter for screening for diabetes mellitus: Secondary | ICD-10-CM | POA: Diagnosis not present

## 2015-08-07 DIAGNOSIS — E039 Hypothyroidism, unspecified: Secondary | ICD-10-CM | POA: Diagnosis not present

## 2015-08-07 DIAGNOSIS — I10 Essential (primary) hypertension: Secondary | ICD-10-CM | POA: Diagnosis not present

## 2015-08-07 DIAGNOSIS — R569 Unspecified convulsions: Secondary | ICD-10-CM | POA: Diagnosis not present

## 2015-08-07 DIAGNOSIS — E785 Hyperlipidemia, unspecified: Secondary | ICD-10-CM | POA: Diagnosis not present

## 2015-08-07 DIAGNOSIS — Z79899 Other long term (current) drug therapy: Secondary | ICD-10-CM | POA: Diagnosis not present

## 2015-08-07 DIAGNOSIS — D638 Anemia in other chronic diseases classified elsewhere: Secondary | ICD-10-CM | POA: Diagnosis not present

## 2015-08-10 LAB — CBC AND DIFFERENTIAL
HEMATOCRIT: 33 % — AB (ref 41–53)
HEMOGLOBIN: 10.5 g/dL — AB (ref 13.5–17.5)
PLATELETS: 239 10*3/uL (ref 150–399)
WBC: 6.2 10*3/mL

## 2015-08-10 LAB — HEMOGLOBIN A1C: HEMOGLOBIN A1C: 5.3

## 2015-08-10 LAB — BASIC METABOLIC PANEL
BUN: 16 mg/dL (ref 4–21)
Creatinine: 0.6 mg/dL (ref 0.6–1.3)
Glucose: 93 mg/dL
POTASSIUM: 4.1 mmol/L (ref 3.4–5.3)
Sodium: 136 mmol/L — AB (ref 137–147)

## 2015-08-10 LAB — LIPID PANEL
CHOLESTEROL: 157 mg/dL (ref 0–200)
HDL: 66 mg/dL (ref 35–70)
LDL CALC: 76 mg/dL
Triglycerides: 75 mg/dL (ref 40–160)

## 2015-08-10 LAB — HEPATIC FUNCTION PANEL
ALT: 16 U/L (ref 10–40)
AST: 23 U/L (ref 14–40)
Alkaline Phosphatase: 141 U/L — AB (ref 25–125)
BILIRUBIN, TOTAL: 0.4 mg/dL

## 2015-08-10 LAB — TSH: TSH: 1.37 u[IU]/mL (ref 0.41–5.90)

## 2015-08-10 LAB — PSA: PSA: 0.02

## 2015-09-10 IMAGING — CR DG ABD PORTABLE 1V
1 series · 1 of 1 positions shown · non-contrast
Comparison: 01/04/2014

CLINICAL DATA: Fecal impaction in rectum

EXAM:
PORTABLE ABDOMEN - 1 VIEW

[AP]
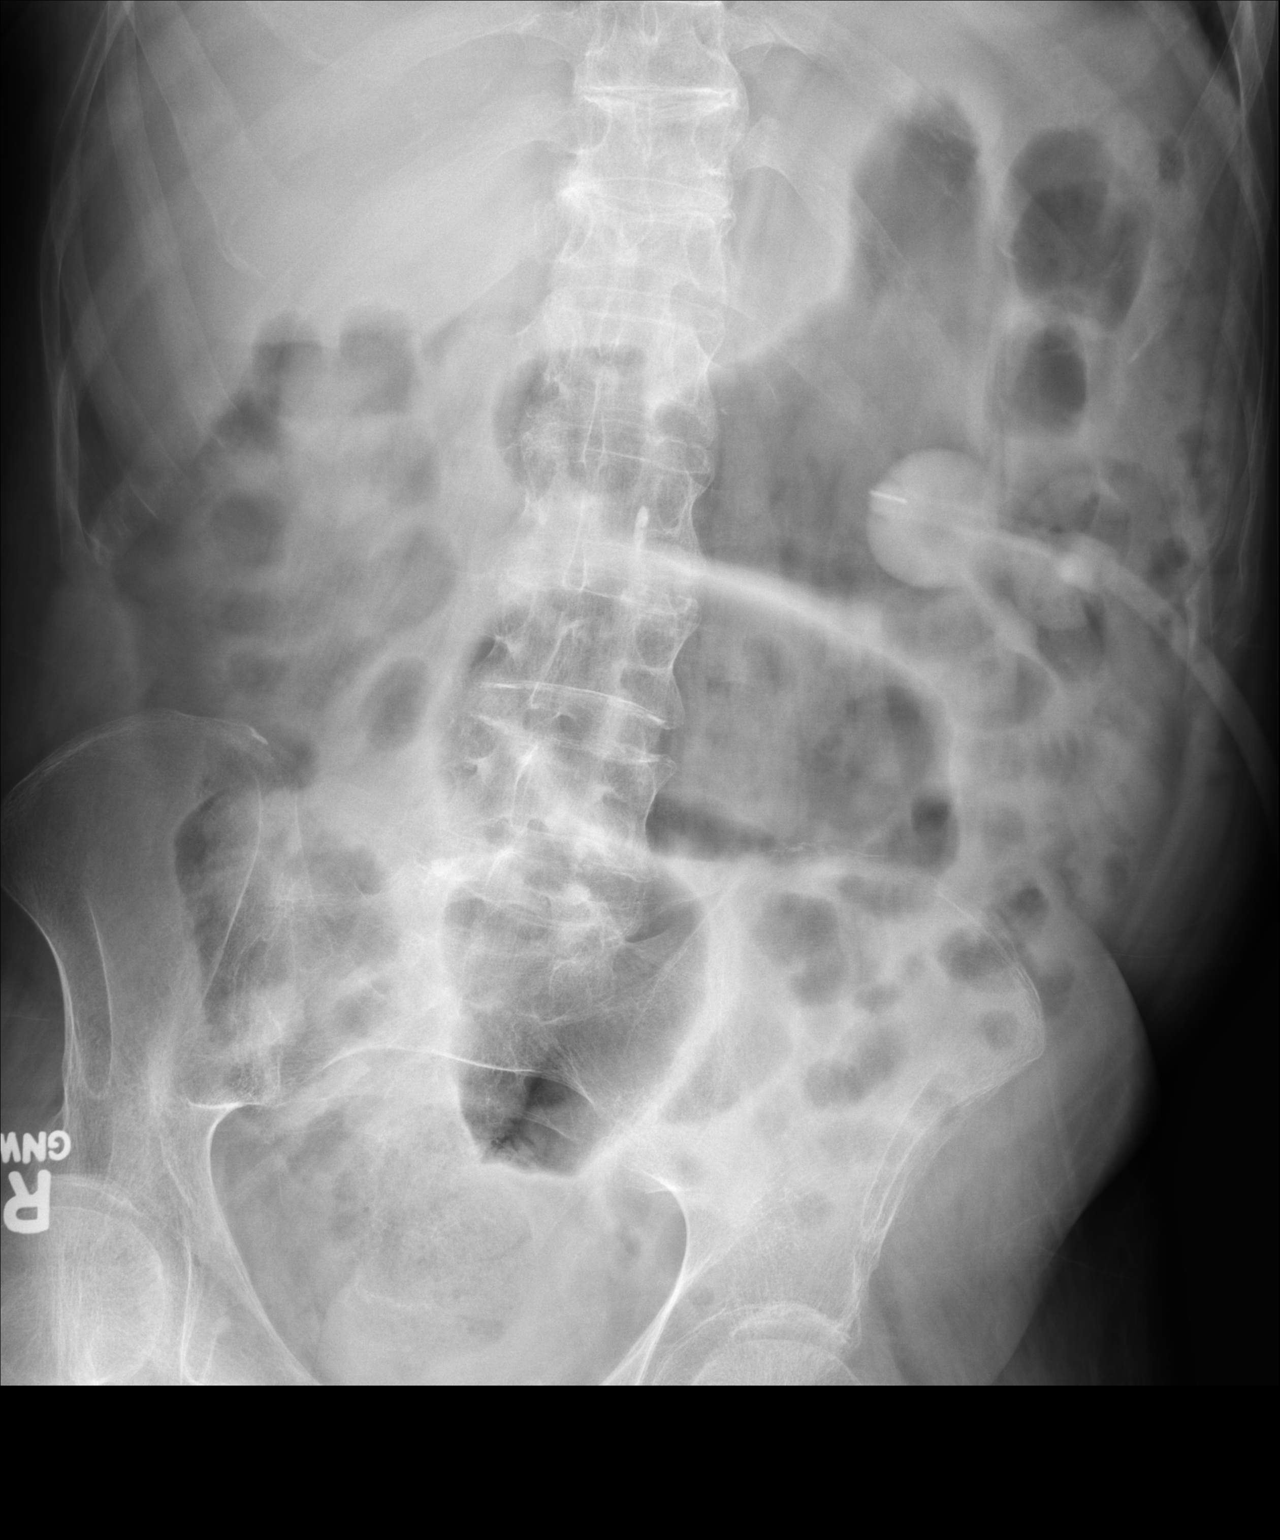

[1 of 1 positions shown; findings below may reference images not displayed]

FINDINGS: A gastric feeding tube again noted. There is nonspecific
nonobstructive bowel gas pattern. There is some residual stool
within rectum with improvement from prior exam. Rectum measures 5 cm
in diameter. Persistent some gaseous distension of sigmoid colon.
IMPRESSION: Nonobstructive small bowel bowel gas pattern. Residual stool within
rectum with improvement from prior exam. Rectum measures about 5 cm
in diameter. Persistent mild gaseous distension of sigmoid colon.

## 2015-09-14 IMAGING — CR DG CHEST 2V
3 series · 3 of 3 positions shown · non-contrast
Comparison: 01/05/2014

CLINICAL DATA: Fever. Recent diagnosis of urinary tract infection.
Pain.

EXAM:
CHEST  2 VIEW

[w chest lat]
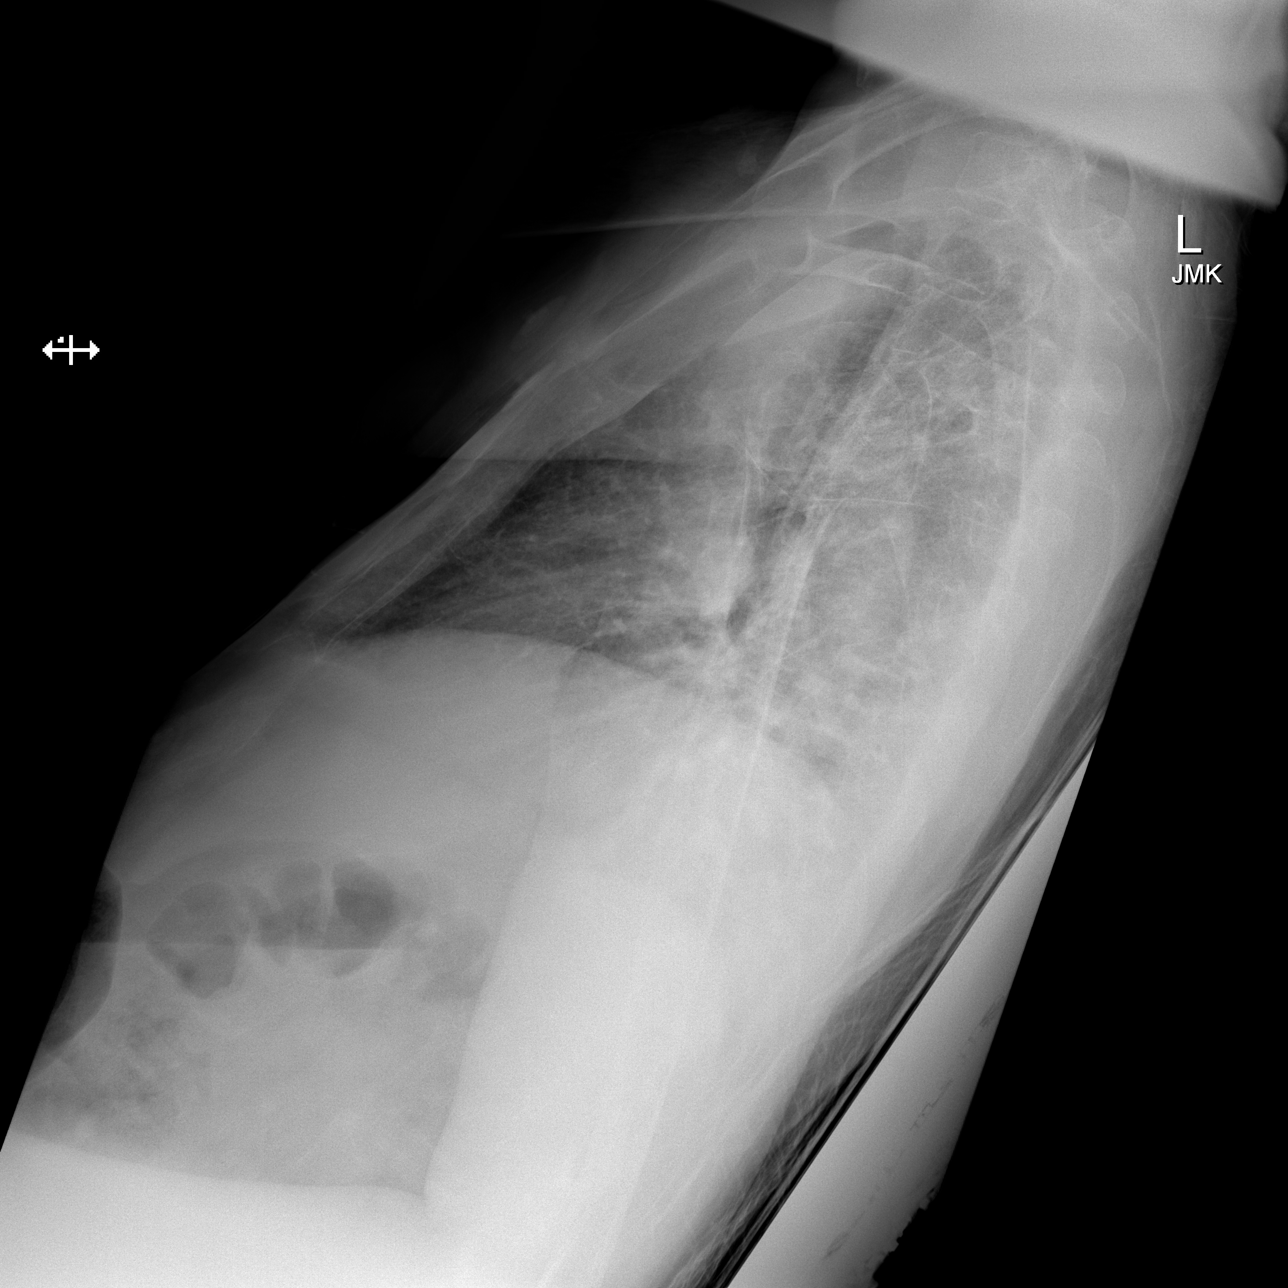

[x chest ap (1 of 2)]
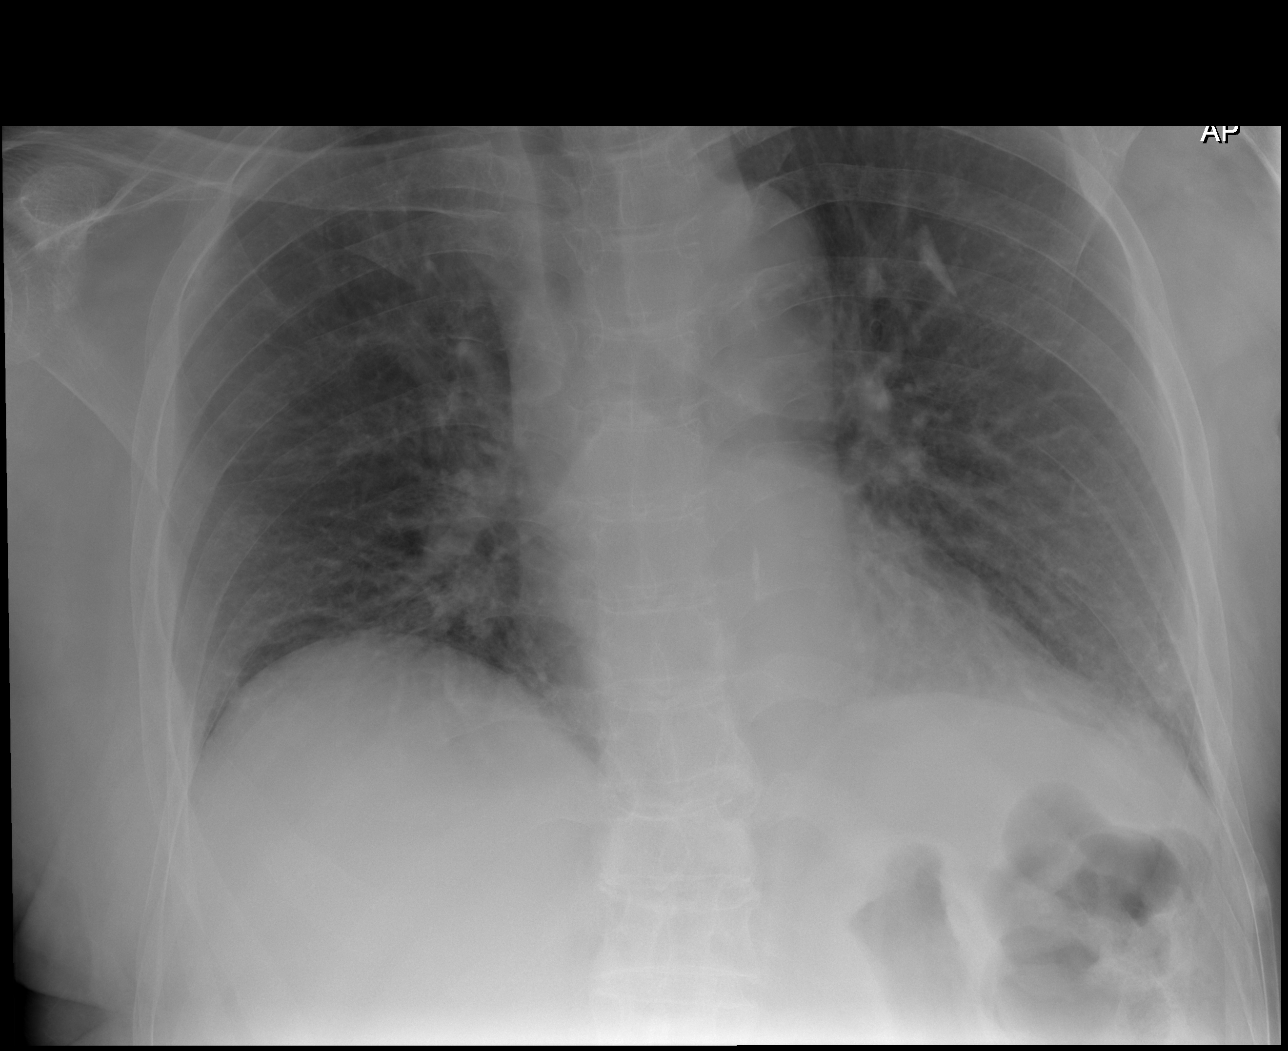

[x chest ap (2 of 2)]
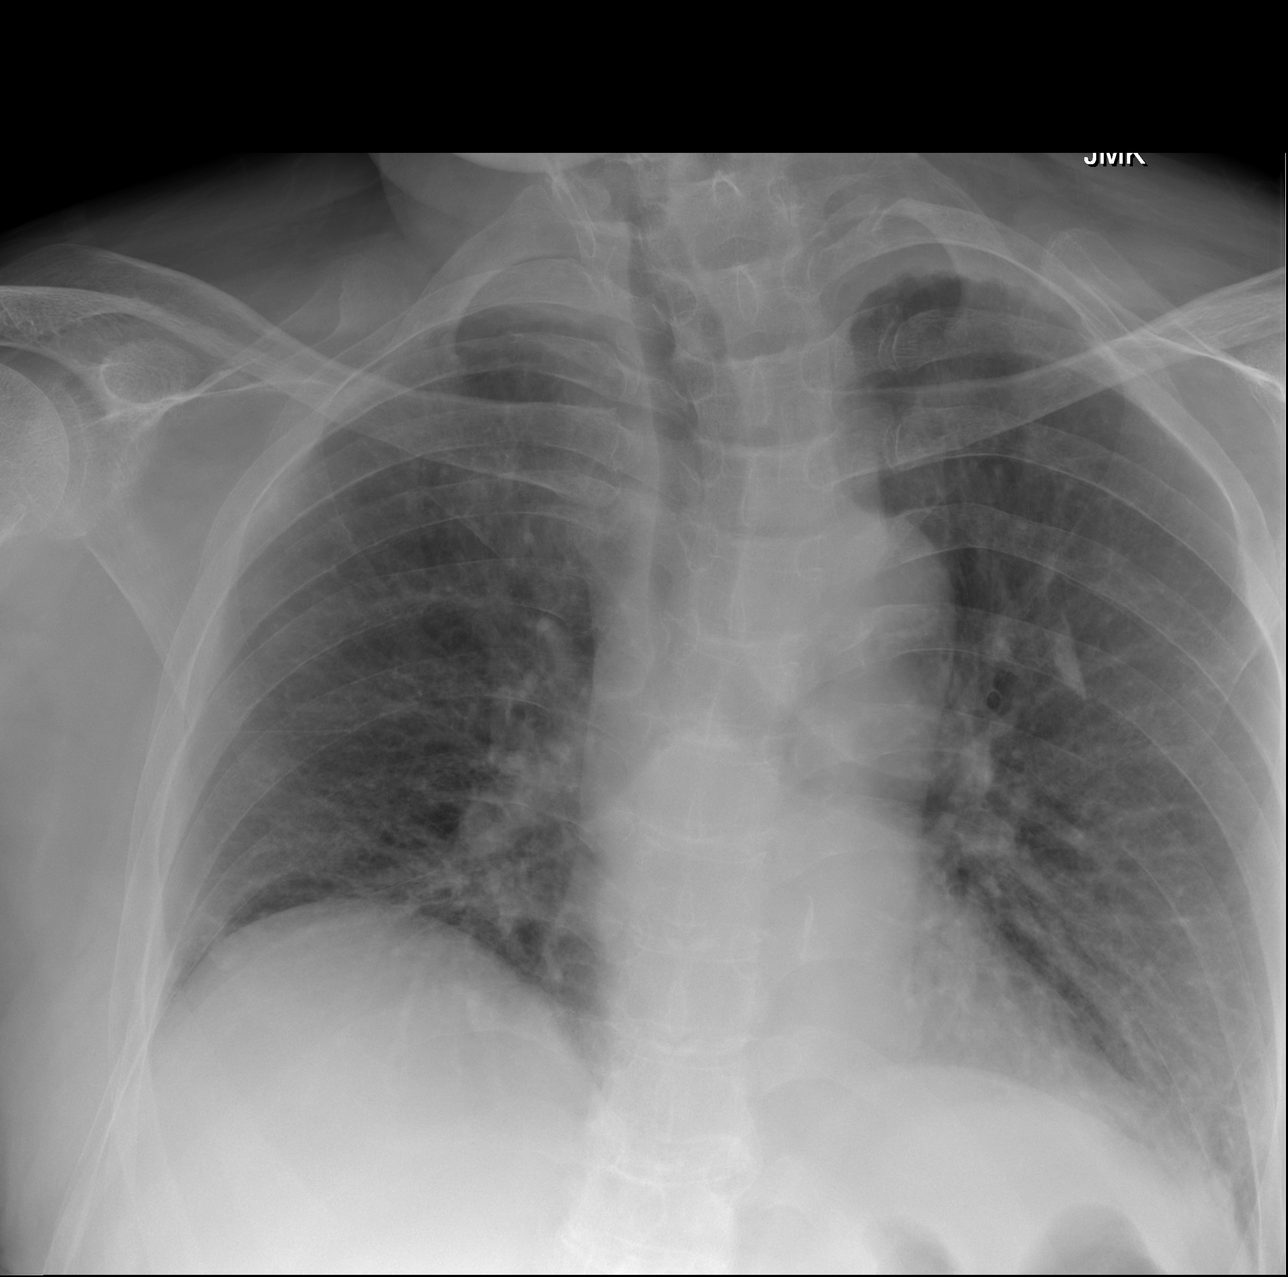

[3 of 3 positions shown; findings below may reference images not displayed]

FINDINGS: No cardiomegaly. Unchanged aortic contours, distorted by rightward
rotation.

There is chronic diffuse interstitial coarsening without pneumonia
or overt edema. No effusion or pneumothorax. Stable biapical
extrapleural thickening. 23 mm long triangular density overlapping
the left mid chest is not definitively seen laterally and is
presumably external to the patient.
IMPRESSION: Diffuse interstitial coarsening which could be congestive or
bronchitic.

## 2015-09-16 DIAGNOSIS — B351 Tinea unguium: Secondary | ICD-10-CM | POA: Diagnosis not present

## 2015-09-16 DIAGNOSIS — G8250 Quadriplegia, unspecified: Secondary | ICD-10-CM | POA: Diagnosis not present

## 2015-09-16 DIAGNOSIS — M79672 Pain in left foot: Secondary | ICD-10-CM | POA: Diagnosis not present

## 2015-09-16 DIAGNOSIS — M79671 Pain in right foot: Secondary | ICD-10-CM | POA: Diagnosis not present

## 2015-09-16 LAB — HM DIABETES FOOT EXAM

## 2015-10-03 IMAGING — CR DG ABD PORTABLE 1V
1 series · 1 of 1 positions shown · non-contrast
Comparison: CT abdomen and pelvis 01/26/2014

CLINICAL DATA: Post percutaneous endoscopic gastrostomy tube
placement.

EXAM:
PORTABLE ABDOMEN - 1 VIEW

[AP]
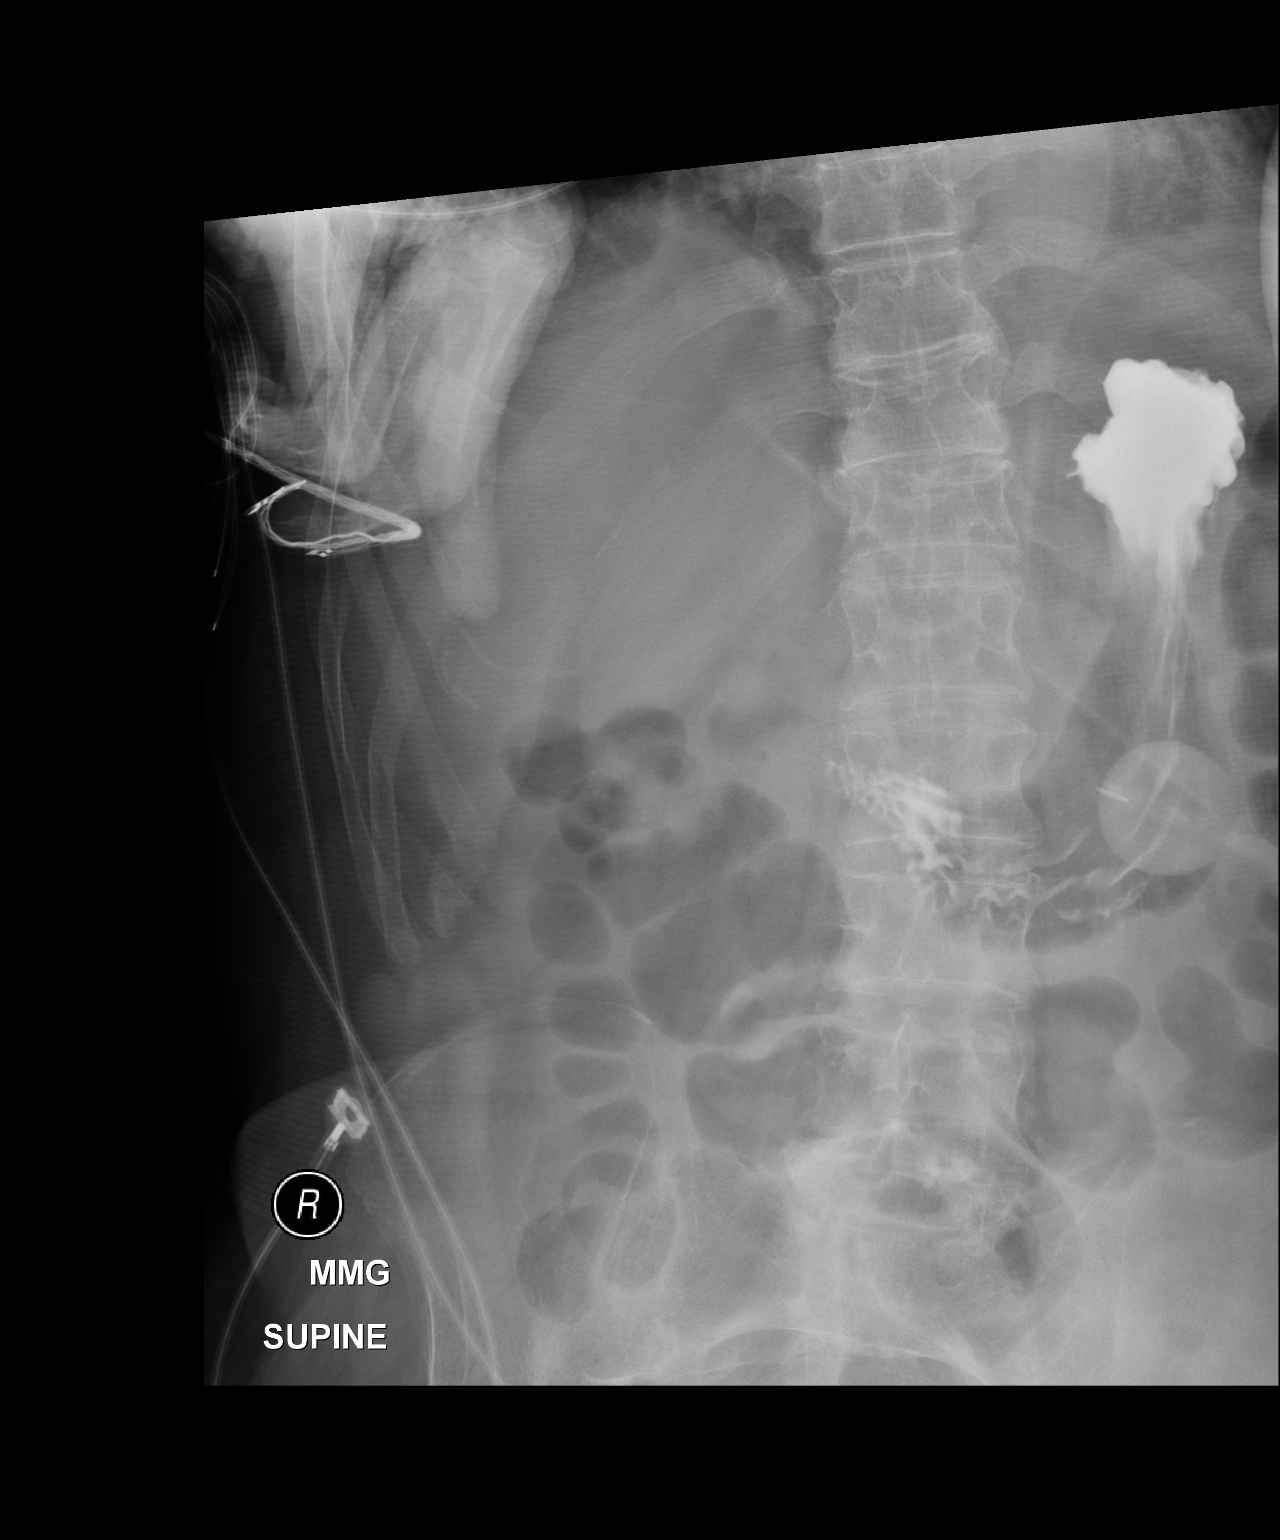

[1 of 1 positions shown; findings below may reference images not displayed]

FINDINGS: Gastrostomy tube in place with balloon located along the greater
curvature. Contrast material is demonstrated within the stomach
presumably from previous tube injection. This suggest tube in place.
Visualized bowel gas pattern is unremarkable.
IMPRESSION: Contrast material in the stomach suggests appropriate location of
gastrostomy tube.

## 2015-10-04 IMAGING — CR DG CHEST 1V PORT
1 series · 1 of 1 positions shown · non-contrast
Comparison: 01/25/2014 and earlier.

CLINICAL DATA: 58-year-old male with nausea vomiting and
respiratory failure. Initial encounter. Current history of
quadriplegia.

EXAM:
PORTABLE CHEST - 1 VIEW

[AP]
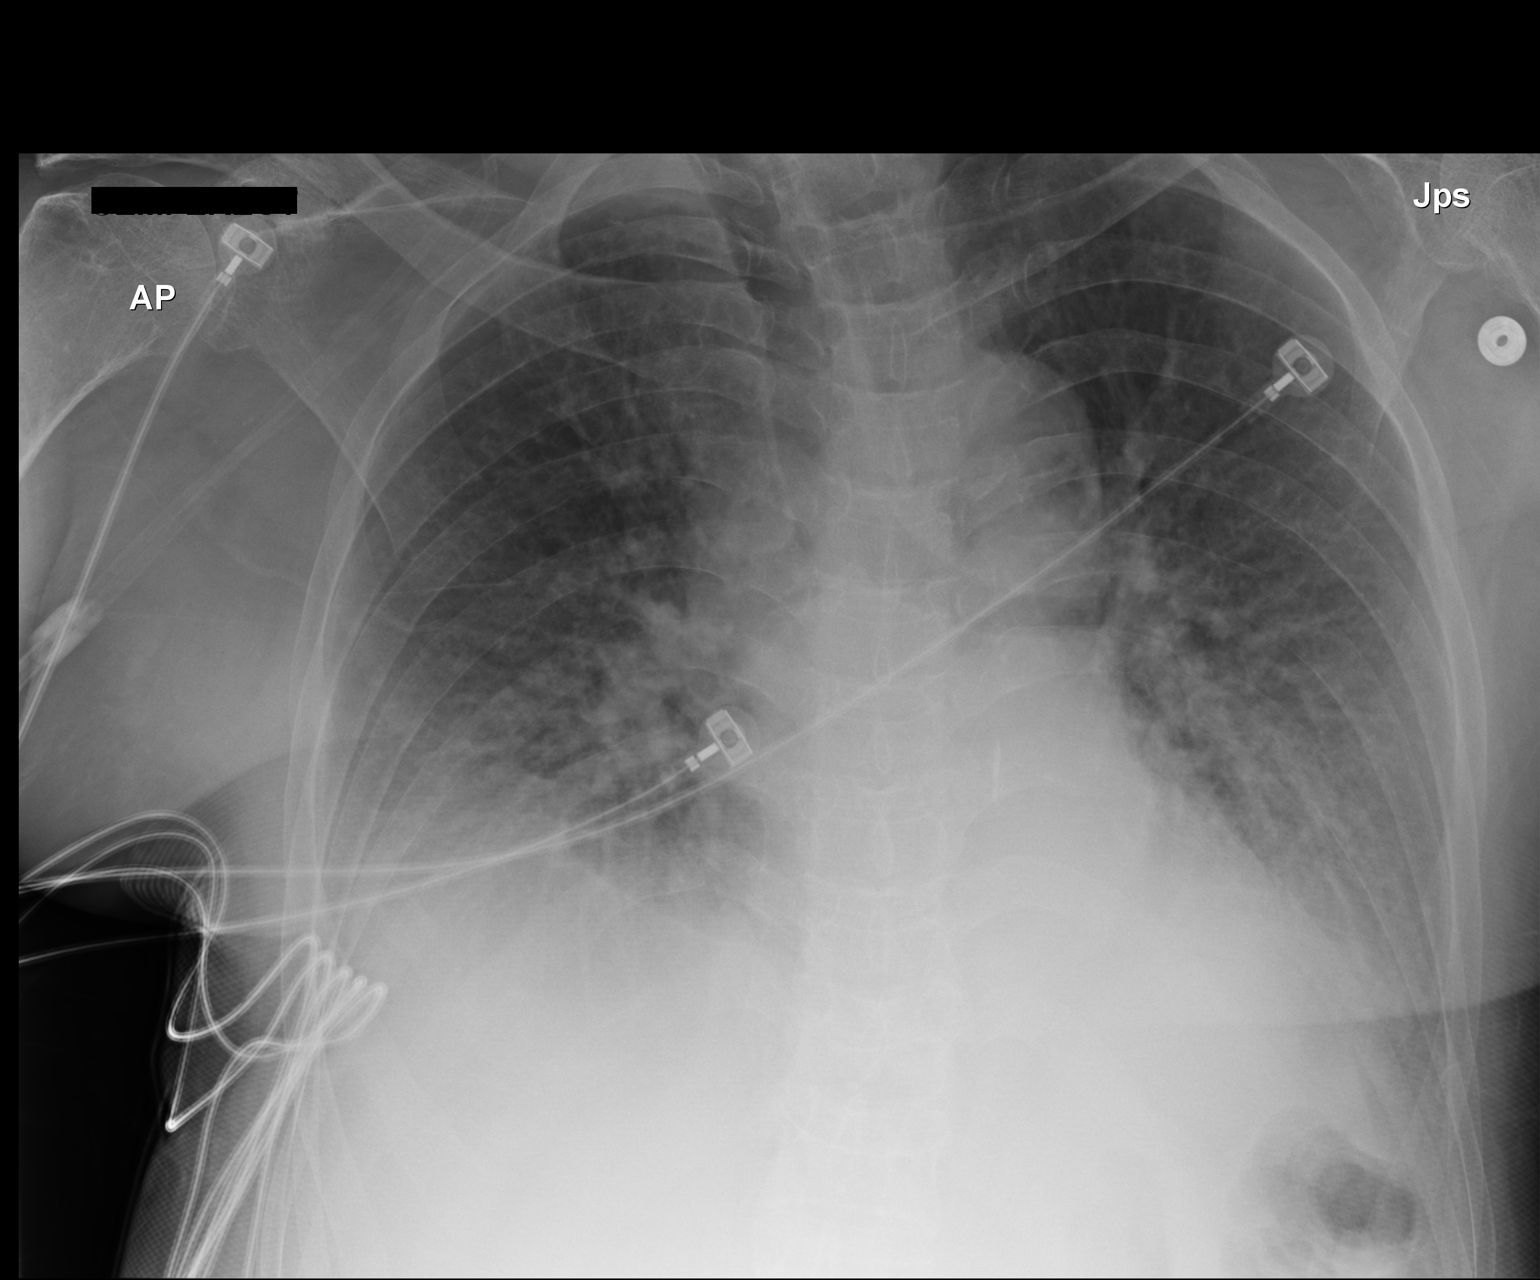

[1 of 1 positions shown; findings below may reference images not displayed]

FINDINGS: Portable AP semi upright view at 9555 hrs. Increased veiling opacity
at both lung bases obscuring the diaphragm now. Stable cardiac size
and mediastinal contours. Increased vascular congestion. Visualized
tracheal air column is within normal limits. No pneumothorax. No
upper lobe consolidation.
IMPRESSION: Increased bilateral pleural effusions, lower lobe collapse, and
vascular congestion/mild interstitial edema.

## 2015-10-07 ENCOUNTER — Non-Acute Institutional Stay (SKILLED_NURSING_FACILITY): Payer: Medicare Other | Admitting: Internal Medicine

## 2015-10-07 ENCOUNTER — Encounter: Payer: Self-pay | Admitting: Internal Medicine

## 2015-10-07 DIAGNOSIS — R569 Unspecified convulsions: Secondary | ICD-10-CM

## 2015-10-07 DIAGNOSIS — G8250 Quadriplegia, unspecified: Secondary | ICD-10-CM | POA: Diagnosis not present

## 2015-10-07 DIAGNOSIS — D509 Iron deficiency anemia, unspecified: Secondary | ICD-10-CM | POA: Diagnosis not present

## 2015-10-07 NOTE — Assessment & Plan Note (Signed)
Unchanged; cont baclofen for contractures

## 2015-10-07 NOTE — Assessment & Plan Note (Signed)
No reported seizures; dilantin level 07/2015 was 19.6; cont current dilantin dose.

## 2015-10-07 NOTE — Assessment & Plan Note (Signed)
Recent Hb 10.5/33.1; cont iron daily

## 2015-10-07 NOTE — Progress Notes (Signed)
MRN: [id number] Name: Nefi Melrose  Sex: male Age: 60 y.o. DOB: 1955/12/22  PSC #:  Facility/Room: [name] / [address] Level Of Care: SNF Provider: Randon Goldsmith. Lyn Hollingshead, MD Emergency Contacts: Extended Emergency Contact Information Primary Emergency Contact: Blizzard,[name] Address: [address]          [place], [place] [address] [place] Home Phone: [phone] Mobile Phone: [phone] Relation: Mother Secondary Emergency Contact: Valinda Party [place] Home Phone: [phone] Relation: Brother Father: [place], [place] Mobile Phone: [phone]  Code Status: Full Code  Allergies: Aspirin; Keflex; Nsaids; and Penicillins  Chief Complaint  Patient presents with  . Medical Management of Chronic Issues    Routine Visit    HPI: Patient is 60 y.o. male who is being seen for routine issues of quadriplegia, seizures and iron def anemia.  Past Medical History  Diagnosis Date  . Quadriplegia (HCC)   . Depression   . Anemia   . Parkinson's disease (HCC)   . Seizures (HCC)   . Adrenal insufficiency (HCC)   . Dysphagia 06/19/2013  . Aphasia 06/19/2013    Past Surgical History  Procedure Laterality Date  . Peg tube placement    . Peripherally inserted central catheter insertion    . Tonsillectomy  1962      Medication List       This list is accurate as of: 10/07/15  3:32 PM.  Always use your most recent med list.               acetaminophen 325 MG tablet  Commonly known as:  TYLENOL  Place 2 tablets (650 mg total) into feeding tube every 6 (six) hours as needed for fever (Give 2 tablets via g-tube every 6 hours as needed for a fever greater than 101 degrees F).     baclofen 10 MG tablet  Commonly known as:  LIORESAL  Place 1 tablet (10 mg total) into feeding tube 3 (three) times daily.     bisacodyl 10 MG suppository  Commonly known as:  DULCOLAX  Place 10 mg rectally daily as needed for moderate  constipation. If constipation not relieved by milk of magnesia give one 10 mg suppository in 24hours     CENTAMIN Liqd  5 mLs by PEG Tube route daily.     ferrous sulfate 220 (44 Fe) MG/5ML solution  Place 330 mg into feeding tube See admin instructions. Give 7.16ml  Per tube every morning     folic acid 1 MG tablet  Commonly known as:  FOLVITE  1 mg by PEG Tube route daily.     loratadine 10 MG tablet  Commonly known as:  CLARITIN  10 mg by PEG Tube route daily.     magnesium hydroxide 400 MG/5ML suspension  Commonly known as:  MILK OF MAGNESIA  Take 30 mLs by mouth daily as needed for mild constipation.     metoCLOPramide 10 MG tablet  Commonly known as:  REGLAN  Place 1 tablet (10 mg total) into feeding tube 4 (four) times daily -  before meals and at bedtime. Via Peg tube     olopatadine 0.1 % ophthalmic solution  Commonly known as:  PATANOL  Place 1 drop into both eyes every morning.     pantoprazole 40 MG tablet  Commonly known as:  PROTONIX  Give 40 mg by tube 2 (two) times daily.     phenytoin 125 MG/5ML suspension  Commonly known as:  DILANTIN  Place 4 mLs (100 mg total) into feeding tube 2 (two) times daily.     polyvinyl alcohol 1.4 % ophthalmic solution  Commonly known as:  LIQUIFILM TEARS  Place 1 drop into both eyes daily at 6 (six) AM.     prednisoLONE 15 MG/5ML Soln  Commonly known as:  PRELONE  Place 4-6 mg into feeding tube daily. Give 6 mg every morning and 4 mg every evening     RA SALINE ENEMA 19-7 GM/118ML Enem  Place 1 each rectally as needed (for constipation).     saccharomyces boulardii 250 MG capsule  Commonly known as:  FLORASTOR  250 mg by PEG Tube route 2 (two) times daily.        No orders of the defined types were placed in this encounter.    Immunization History  Administered Date(s) Administered  . Influenza Whole 12/31/2012  . Influenza-Unspecified 12/29/2011, 12/31/2012, 01/07/2014, 12/22/2014  . PPD Test 08/22/2008  .  Pneumococcal-Unspecified 06/23/2010    Social History  Substance Use Topics  . Smoking status: Never Smoker   . Smokeless tobacco: Never Used  . Alcohol Use: No    Review of Systems  DATA OBTAINED: from patient, nurse GENERAL:  no fevers, fatigue, appetite changes SKIN: No itching, rash HEENT: No complaint RESPIRATORY: No cough, wheezing, SOB CARDIAC: No chest pain, palpitations, lower extremity edema  GI: No abdominal pain, No N/V/D or constipation, No heartburn or reflux  GU: No dysuria, frequency or urgency, or incontinence  MUSCULOSKELETAL: No unrelieved bone/joint pain NEUROLOGIC: No headache, dizziness  PSYCHIATRIC: No overt anxiety or sadness  Filed Vitals:   10/07/15 1157  BP: 118/73  Pulse: 71  Temp: 98.6 F (37 C)  Resp: 18    Physical Exam  GENERAL APPEARANCE: Alert, aphasic, No acute distress  SKIN: No diaphoresis rash HEENT: Unremarkable RESPIRATORY: Breathing is even, unlabored. Lung sounds are clear   CARDIOVASCULAR: Heart RRR no murmurs, rubs or gallops. No peripheral edema  GASTROINTESTINAL: Abdomen is soft, non-tender, not distended w/ normal bowel sounds.PEG  GENITOURINARY: Bladder non tender, not distended  MUSCULOSKELETAL: wasting,contractures NEUROLOGIC: Cranial nerves 2-12 grossly intact ;quadriplegia;has some minor movement od upper ext PSYCHIATRIC: Mood and affect appropriate to situation, no behavioral issues  Patient Active Problem List   Diagnosis Date Noted  . GERD (gastroesophageal reflux disease) 03/17/2015  . Tinea cruris 09/10/2014  . Aspiration pneumonia (HCC) 08/15/2014  . Abscess, prostate 08/15/2014  . Pyelonephritis 08/06/2014  . Nausea & vomiting   . Fever presenting with conditions classified elsewhere 03/16/2014  . Altered mental status 03/16/2014  . Acute bronchiolitis due to unspecified organism 03/11/2014  . Enteritis due to Clostridium difficile 03/11/2014  . Type 2 diabetes mellitus without complication (HCC)  03/11/2014  . Septic shock (HCC) 01/26/2014  . Sepsis (HCC) 01/25/2014  . Acute encephalopathy 01/02/2014  . Acute respiratory failure with hypercapnia (HCC) 01/02/2014  . Chronic anemia 01/02/2014  . Subdural hematoma (HCC) 01/02/2014  . Pain in thoracic spine 12/26/2013  . PEG (percutaneous endoscopic gastrostomy) status (HCC) 10/04/2013  . Rash and nonspecific skin eruption 07/08/2013  . Thrush 07/08/2013  . Heme positive stool 07/07/2013  . Severe sepsis (HCC) 06/23/2013  . E. coli pyelonephritis 06/22/2013  . SIRS (systemic inflammatory response syndrome) (HCC) 06/19/2013  . Leukocytosis 06/19/2013  . Bronchitis 06/19/2013  . Dysphagia 06/19/2013  . Aphasia 06/19/2013  . Other convulsions 08/10/2012  . Iron deficiency anemia 08/10/2012  . Glucocorticoid deficiency (HCC) 08/10/2012  . Febrile illness, acute 06/15/2012  .  UTI (lower urinary tract infection) 06/15/2012  . Hypokalemia 11/10/2011  . Diabetic gastroparesis (HCC) 11/08/2011  . Ileus (HCC) 11/08/2011  . Hyponatremia 11/08/2011  . Adrenal insufficiency (HCC) 11/08/2011  . Quadriplegia (HCC) 11/08/2011  . Parkinson's disease (HCC)   . Seizures (HCC)   . Depression     CBC    Component Value Date/Time   WBC 6.2 08/10/2015   WBC 6.3 11/26/2014 0407   RBC 4.33 11/26/2014 0407   HGB 10.5* 08/10/2015   HCT 33* 08/10/2015   PLT 239 08/10/2015   MCV 72.7* 11/26/2014 0407   LYMPHSABS 2.2 11/26/2014 0407   MONOABS 0.8 11/26/2014 0407   EOSABS 0.4 11/26/2014 0407   BASOSABS 0.0 11/26/2014 0407    CMP     Component Value Date/Time   NA 136* 08/10/2015   NA 131* 11/26/2014 0407   K 4.1 08/10/2015   CL 96* 11/26/2014 0407   CO2 27 11/26/2014 0407   GLUCOSE 93 11/26/2014 0407   BUN 16 08/10/2015   BUN 11 11/26/2014 0407   CREATININE 0.6 08/10/2015   CREATININE 0.53* 11/26/2014 0407   CALCIUM 8.5* 11/26/2014 0407   PROT 7.7 11/26/2014 0407   ALBUMIN 3.0* 11/26/2014 0407   AST 23 08/10/2015   ALT 16  08/10/2015   ALKPHOS 141* 08/10/2015   BILITOT <0.1* 11/26/2014 0407   GFRNONAA >60 11/26/2014 0407   GFRAA >60 11/26/2014 0407    Assessment and Plan  Seizures No reported seizures; dilantin level 07/2015 was 19.6; cont current dilantin dose.  Iron deficiency anemia Recent Hb 10.5/33.1; cont iron daily  Quadriplegia Unchanged; cont baclofen for contractures    Deicy Rusk D. Lyn Hollingshead, MD

## 2015-10-08 DIAGNOSIS — Z79899 Other long term (current) drug therapy: Secondary | ICD-10-CM | POA: Diagnosis not present

## 2015-10-09 LAB — MICROALBUMIN, URINE: Microalb, Ur: 29.1

## 2015-10-28 ENCOUNTER — Encounter: Payer: Self-pay | Admitting: Internal Medicine

## 2015-10-28 ENCOUNTER — Non-Acute Institutional Stay (SKILLED_NURSING_FACILITY): Payer: Medicare Other | Admitting: Internal Medicine

## 2015-10-28 DIAGNOSIS — H578 Other specified disorders of eye and adnexa: Secondary | ICD-10-CM

## 2015-10-28 DIAGNOSIS — E2740 Unspecified adrenocortical insufficiency: Secondary | ICD-10-CM

## 2015-10-28 DIAGNOSIS — E119 Type 2 diabetes mellitus without complications: Secondary | ICD-10-CM

## 2015-10-28 DIAGNOSIS — R6889 Other general symptoms and signs: Secondary | ICD-10-CM

## 2015-10-28 NOTE — Progress Notes (Signed)
MRN: [id number] Name: [name]  Sex: male Age: 60 y.o. DOB: 1955-06-30  PSC #:  Facility/Room: [name] / [address] Level Of Care: SNF Provider: Randon Goldsmith. Lyn Hollingshead, MD Emergency Contacts: Extended Emergency Contact Information Primary Emergency Contact: Norman,[name] Address: [address]          [place], [place] [address] [place] Home Phone: [phone] Mobile Phone: [phone] Relation: Mother Secondary Emergency Contact: Valinda Party [place] Home Phone: [phone] Relation: Brother Father: [place], [place] Mobile Phone: [phone]  Code Status: Full Code  Allergies: Aspirin; Keflex [cephalexin]; Nsaids; and Penicillins  Chief Complaint  Patient presents with  . Medical Management of Chronic Issues    Routine Visit    HPI: Patient is 60 y.o. male who is quadriplegic who is being seen for routine issues of adrenal insuff, and dry/itchy eyes.  Past Medical History:  Diagnosis Date  . Adrenal insufficiency (HCC)   . Anemia   . Aphasia 06/19/2013  . Depression   . Dysphagia 06/19/2013  . Parkinson's disease (HCC)   . Quadriplegia (HCC)   . Seizures (HCC)     Past Surgical History:  Procedure Laterality Date  . PEG TUBE PLACEMENT    . PERIPHERALLY INSERTED CENTRAL CATHETER INSERTION    . TONSILLECTOMY  1962      Medication List       Accurate as of 10/28/15 11:59 PM. Always use your most recent med list.          acetaminophen 325 MG tablet Commonly known as:  TYLENOL Place 2 tablets (650 mg total) into feeding tube every 6 (six) hours as needed for fever (Give 2 tablets via g-tube every 6 hours as needed for a fever greater than 101 degrees F).   baclofen 10 MG tablet Commonly known as:  LIORESAL Place 1 tablet (10 mg total) into feeding tube 3 (three) times daily.   bisacodyl 10 MG suppository Commonly known as:  DULCOLAX Place 10 mg rectally daily as needed for moderate  constipation. If constipation not relieved by milk of magnesia give one 10 mg suppository in 24hours   CENTAMIN Liqd 5 mLs by PEG Tube route daily.   CETAPHIL GENTLE CLEANSER Liqd Apply to face and neck with water and then pat dry daily.   ferrous sulfate 220 (44 Fe) MG/5ML solution Place 330 mg into feeding tube See admin instructions. Give 7.62ml  Per tube every morning   folic acid 1 MG tablet Commonly known as:  FOLVITE 1 mg by PEG Tube route daily.   loratadine 10 MG tablet Commonly known as:  CLARITIN 10 mg by PEG Tube route daily.   magnesium hydroxide 400 MG/5ML suspension Commonly known as:  MILK OF MAGNESIA Take 30 mLs by mouth daily as needed for mild constipation.   metoCLOPramide 10 MG tablet Commonly known as:  REGLAN Place 1 tablet (10 mg total) into feeding tube 4 (four) times daily -  before meals and at bedtime. Via Peg tube   NUTRITIONAL SUPPLEMENTS PO Give Two Cal HN at 55cc/hr x 20 hrs via peg ( HOLD 1 HOUR PRE/POST [place]ENT[place] ADMINISTRATION)   olopatadine 0.1 % ophthalmic solution Commonly known as:  PATANOL Place 1 drop into both eyes every morning.   pantoprazole 40 MG tablet Commonly known as:  PROTONIX Give 40 mg by tube 2 (two) times daily.   phenytoin 125 MG/5ML suspension Commonly known as:  DILANTIN Place 4 mLs (100 mg total) into  feeding tube 2 (two) times daily.   polyvinyl alcohol 1.4 % ophthalmic solution Commonly known as:  LIQUIFILM TEARS Place 1 drop into both eyes daily at 6 (six) AM.   prednisoLONE 15 MG/5ML Soln Commonly known as:  PRELONE Place into feeding tube. Give 6 mg every morning and 4 mg every evening at 6 pm   RA SALINE ENEMA 19-7 GM/118ML Enem Place 1 each rectally as needed (for constipation).   saccharomyces boulardii 250 MG capsule Commonly known as:  FLORASTOR 250 mg by PEG Tube route 2 (two) times daily.       Meds ordered this encounter  Medications  . NUTRITIONAL SUPPLEMENTS PO    Sig: Give Two Cal HN  at 55cc/hr x 20 hrs via peg ( HOLD 1 HOUR PRE/POST PHENTON ADMINISTRATION)  . Soap & Cleansers (CETAPHIL GENTLE CLEANSER) LIQD    Sig: Apply to face and neck with water and then pat dry daily.    Immunization History  Administered Date(s) Administered  . Influenza Whole 12/31/2012  . Influenza-Unspecified 12/29/2011, 12/31/2012, 01/07/2014, 12/22/2014  . PPD Test 08/22/2008  . Pneumococcal-Unspecified 06/23/2010    Social History  Substance Use Topics  . Smoking status: Never Smoker  . Smokeless tobacco: Never Used  . Alcohol use No    Review of Systems  DATA OBTAINED: from nurse GENERAL:  no fevers, fatigue, appetite changes SKIN: No itching, rash HEENT: No complaint RESPIRATORY: No cough, wheezing, SOB CARDIAC: No chest pain, palpitations, lower extremity edema  GI: No abdominal pain, No N/V/D or constipation, No heartburn or reflux  GU: No dysuria, frequency or urgency, or incontinence  MUSCULOSKELETAL: No unrelieved bone/joint pain NEUROLOGIC: No headache, dizziness  PSYCHIATRIC: No overt anxiety or sadness  Vitals:   10/28/15 0935  BP: 118/73  Pulse: 71  Resp: 18  Temp: 98.6 F (37 C)    Physical Exam  GENERAL APPEARANCE: Alert, nonconversant, No acute distress ;pt interacts, smiles, can pont to his board SKIN: No diaphoresis rash HEENT: Unremarkable RESPIRATORY: Breathing is even, unlabored. Lung sounds are clear   CARDIOVASCULAR: Heart RRR no murmurs, rubs or gallops. No peripheral edema  GASTROINTESTINAL: Abdomen is soft, non-tender, not distended w/ normal bowel sounds;PEG GENITOURINARY: Bladder non tender, not distended  MUSCULOSKELETAL: wasting and contractures NEUROLOGIC: Cranial nerves 2-12 grossly intact; minimal UE movement PSYCHIATRIC: Mood and affect appropriate to situation, no behavioral issues  Patient Active Problem List   Diagnosis Date Noted  . Itchy eyes 11/12/2015  . GERD (gastroesophageal reflux disease) 03/17/2015  . Tinea cruris  09/10/2014  . Aspiration pneumonia (HCC) 08/15/2014  . Abscess, prostate 08/15/2014  . Pyelonephritis 08/06/2014  . Nausea & vomiting   . Fever presenting with conditions classified elsewhere 03/16/2014  . Altered mental status 03/16/2014  . Acute bronchiolitis due to unspecified organism 03/11/2014  . Enteritis due to Clostridium difficile 03/11/2014  . Type 2 diabetes mellitus without complication (HCC) 03/11/2014  . Septic shock (HCC) 01/26/2014  . Sepsis (HCC) 01/25/2014  . Acute encephalopathy 01/02/2014  . Acute respiratory failure with hypercapnia (HCC) 01/02/2014  . Chronic anemia 01/02/2014  . Subdural hematoma (HCC) 01/02/2014  . Pain in thoracic spine 12/26/2013  . PEG (percutaneous endoscopic gastrostomy) status (HCC) 10/04/2013  . Rash and nonspecific skin eruption 07/08/2013  . Thrush 07/08/2013  . Heme positive stool 07/07/2013  . Severe sepsis (HCC) 06/23/2013  . E. coli pyelonephritis 06/22/2013  . SIRS (systemic inflammatory response syndrome) (HCC) 06/19/2013  . Leukocytosis 06/19/2013  . Bronchitis 06/19/2013  . Dysphagia  06/19/2013  . Aphasia 06/19/2013  . Other convulsions 08/10/2012  . Iron deficiency anemia 08/10/2012  . Glucocorticoid deficiency (HCC) 08/10/2012  . Febrile illness, acute 06/15/2012  . UTI (lower urinary tract infection) 06/15/2012  . Hypokalemia 11/10/2011  . Diabetic gastroparesis (HCC) 11/08/2011  . Ileus (HCC) 11/08/2011  . Hyponatremia 11/08/2011  . Adrenal insufficiency (HCC) 11/08/2011  . Quadriplegia (HCC) 11/08/2011  . Parkinson's disease (HCC)   . Seizures (HCC)   . Depression     CBC    Component Value Date/Time   WBC 6.2 08/10/2015   WBC 6.3 11/26/2014 0407   RBC 4.33 11/26/2014 0407   HGB 10.5 (A) 08/10/2015   HCT 33 (A) 08/10/2015   PLT 239 08/10/2015   MCV 72.7 (L) 11/26/2014 0407   LYMPHSABS 2.2 11/26/2014 0407   MONOABS 0.8 11/26/2014 0407   EOSABS 0.4 11/26/2014 0407   BASOSABS 0.0 11/26/2014 0407     CMP     Component Value Date/Time   NA 136 (A) 08/10/2015   K 4.1 08/10/2015   CL 96 (L) 11/26/2014 0407   CO2 27 11/26/2014 0407   GLUCOSE 93 11/26/2014 0407   BUN 16 08/10/2015   CREATININE 0.6 08/10/2015   CREATININE 0.53 (L) 11/26/2014 0407   CALCIUM 8.5 (L) 11/26/2014 0407   PROT 7.7 11/26/2014 0407   ALBUMIN 3.0 (L) 11/26/2014 0407   AST 23 08/10/2015   ALT 16 08/10/2015   ALKPHOS 141 (A) 08/10/2015   BILITOT <0.1 (L) 11/26/2014 0407   GFRNONAA >60 11/26/2014 0407   GFRAA >60 11/26/2014 0407    Assessment and Plan  Adrenal insufficiency Stable, no need for extra supplements; cont prelone syrip 6 mg qam and 4 mg q aft  Type 2 diabetes mellitus without complication In 07/2015 A1c 5.3 on diet via tube feeds; no changes needed  Itchy eyes Controlled with patanol drops q am; dryness with liquifilm tears daily   Vear Staton D. Lyn Hollingshead, MD

## 2015-10-30 DIAGNOSIS — M24532 Contracture, left wrist: Secondary | ICD-10-CM | POA: Diagnosis not present

## 2015-10-30 DIAGNOSIS — M24531 Contracture, right wrist: Secondary | ICD-10-CM | POA: Diagnosis not present

## 2015-10-30 DIAGNOSIS — R278 Other lack of coordination: Secondary | ICD-10-CM | POA: Diagnosis not present

## 2015-10-30 DIAGNOSIS — G20 Parkinson's disease: Secondary | ICD-10-CM | POA: Diagnosis not present

## 2015-11-12 ENCOUNTER — Encounter: Payer: Self-pay | Admitting: Internal Medicine

## 2015-11-12 DIAGNOSIS — R6889 Other general symptoms and signs: Secondary | ICD-10-CM | POA: Insufficient documentation

## 2015-11-12 NOTE — Assessment & Plan Note (Addendum)
In 07/2015 A1c 5.3 on diet via tube feeds;  microalbumin was 29.1 which is high normal, but still normal;since A1c is so good opting not to treat; no changes needed

## 2015-11-12 NOTE — Assessment & Plan Note (Signed)
Stable, no need for extra supplements; cont prelone syrip 6 mg qam and 4 mg q aft

## 2015-11-12 NOTE — Assessment & Plan Note (Signed)
Controlled with patanol drops q am; dryness with liquifilm tears daily

## 2015-11-27 ENCOUNTER — Encounter: Payer: Self-pay | Admitting: Internal Medicine

## 2015-11-27 ENCOUNTER — Non-Acute Institutional Stay (SKILLED_NURSING_FACILITY): Payer: Medicare Other | Admitting: Internal Medicine

## 2015-11-27 DIAGNOSIS — K3184 Gastroparesis: Secondary | ICD-10-CM | POA: Diagnosis not present

## 2015-11-27 DIAGNOSIS — E1143 Type 2 diabetes mellitus with diabetic autonomic (poly)neuropathy: Secondary | ICD-10-CM

## 2015-11-27 DIAGNOSIS — R1310 Dysphagia, unspecified: Secondary | ICD-10-CM | POA: Diagnosis not present

## 2015-11-27 DIAGNOSIS — K219 Gastro-esophageal reflux disease without esophagitis: Secondary | ICD-10-CM | POA: Diagnosis not present

## 2015-11-27 NOTE — Progress Notes (Signed)
MRN: [id number] Name: [name]  Sex: male Age: 60 y.o. DOB: 1955/04/19  PSC #:  Facility/Room: [name] / [address] Level Of Care: SNF Provider: Randon Goldsmith. Lyn Hollingshead, MD Emergency Contacts: Extended Emergency Contact Information Primary Emergency Contact: Wilhite,[name] Address: [address]          [place], [place] [address] [place] Home Phone: [phone] Mobile Phone: [phone] Relation: Mother Secondary Emergency Contact: Valinda Party [place] Home Phone: [phone] Relation: Brother Father: Amal, [place] Mobile Phone: [phone]  Code Status: Full Code  Allergies: Aspirin; Keflex [cephalexin]; Nsaids; and Penicillins  Chief Complaint  Patient presents with  . Medical Management of Chronic Issues    Routine Visit    HPI: Patient is 60 y.o. male with quadrplegia who is being seen for routine issues of diabetic gastreoparesis, dysphagia and GERD.  Past Medical History:  Diagnosis Date  . Adrenal insufficiency (HCC)   . Anemia   . Aphasia 06/19/2013  . Depression   . Dysphagia 06/19/2013  . Parkinson's disease (HCC)   . Quadriplegia (HCC)   . Seizures (HCC)     Past Surgical History:  Procedure Laterality Date  . PEG TUBE PLACEMENT    . PERIPHERALLY INSERTED CENTRAL CATHETER INSERTION    . TONSILLECTOMY  1962      Medication List       Accurate as of 11/27/15 11:59 PM. Always use your most recent med list.          acetaminophen 325 MG tablet Commonly known as:  TYLENOL Place 2 tablets (650 mg total) into feeding tube every 6 (six) hours as needed for fever (Give 2 tablets via g-tube every 6 hours as needed for a fever greater than 101 degrees F).   baclofen 10 MG tablet Commonly known as:  LIORESAL Place 1 tablet (10 mg total) into feeding tube 3 (three) times daily.   bisacodyl 10 MG suppository Commonly known as:  DULCOLAX Place 10 mg rectally daily as needed for moderate  constipation. If constipation not relieved by milk of magnesia give one 10 mg suppository in 24hours   CENTAMIN Liqd 5 mLs by PEG Tube route daily.   CETAPHIL GENTLE CLEANSER Liqd Apply to face and neck with water and then pat dry daily.   ferrous sulfate 220 (44 Fe) MG/5ML solution Place 330 mg into feeding tube See admin instructions. Give 7.62ml  Per tube every morning   folic acid 1 MG tablet Commonly known as:  FOLVITE 1 mg by PEG Tube route daily.   loratadine 10 MG tablet Commonly known as:  CLARITIN 10 mg by PEG Tube route daily.   magnesium hydroxide 400 MG/5ML suspension Commonly known as:  MILK OF MAGNESIA Take 30 mLs by mouth daily as needed for mild constipation.   metoCLOPramide 10 MG tablet Commonly known as:  REGLAN Place 1 tablet (10 mg total) into feeding tube 4 (four) times daily -  before meals and at bedtime. Via Peg tube   NUTRITIONAL SUPPLEMENTS PO Give Two Cal HN at 55cc/hr x 20 hrs via peg ( HOLD 1 HOUR PRE/POST PHENTON ADMINISTRATION)   olopatadine 0.1 % ophthalmic solution Commonly known as:  PATANOL Place 1 drop into both eyes every morning.   pantoprazole 40 MG tablet Commonly known as:  PROTONIX Give 40 mg by tube 2 (two) times daily.   phenytoin 125 MG/5ML suspension Commonly known as:  DILANTIN Place 4 mLs (100 mg total) into feeding  tube 2 (two) times daily.   polyvinyl alcohol 1.4 % ophthalmic solution Commonly known as:  LIQUIFILM TEARS Place 1 drop into both eyes daily at 6 (six) AM.   prednisoLONE 15 MG/5ML Soln Commonly known as:  PRELONE Place into feeding tube. Give 6 mg every morning and 4 mg every evening at 6 pm   RA SALINE ENEMA 19-7 GM/118ML Enem Place 1 each rectally as needed (for constipation).   senna 8.6 MG Tabs tablet Commonly known as:  SENOKOT Place 1 tablet into feeding tube at bedtime. Hold for diarrhea       Meds ordered this encounter  Medications  . senna (SENOKOT) 8.6 MG TABS tablet    Sig: Place  1 tablet into feeding tube at bedtime. Hold for diarrhea    Immunization History  Administered Date(s) Administered  . Influenza Whole 12/31/2012  . Influenza-Unspecified 12/29/2011, 12/31/2012, 01/07/2014, 12/22/2014  . PPD Test 08/22/2008  . Pneumococcal-Unspecified 06/23/2010    Social History  Substance Use Topics  . Smoking status: Never Smoker  . Smokeless tobacco: Never Used  . Alcohol use No    Review of Systems  DATA OBTAINED: from patient, nurse GENERAL:  no fevers, fatigue, appetite changes SKIN: No itching, rash HEENT: No complaint RESPIRATORY: No cough, wheezing, SOB CARDIAC: No chest pain, palpitations, lower extremity edema  GI: No abdominal pain, No N/V/D or constipation, No heartburn or reflux  GU: No dysuria, frequency or urgency, or incontinence  MUSCULOSKELETAL: No unrelieved bone/joint pain NEUROLOGIC: No headache, dizziness  PSYCHIATRIC: No overt anxiety or sadness  Vitals:   11/27/15 1048  BP: 118/73  Pulse: 74  Resp: 18  Temp: 98 F (36.7 C)    Physical Exam  GENERAL APPEARANCE: Alert,non conversant, No acute distress  SKIN: No diaphoresis rash HEENT: Unremarkable RESPIRATORY: Breathing is even, unlabored. Lung sounds are clear   CARDIOVASCULAR: Heart RRR no murmurs, rubs or gallops. No peripheral edema  GASTROINTESTINAL: Abdomen is soft, non-tender, not distended w/ normal bowel sounds; PEG tube.  GENITOURINARY: Bladder non tender, not distended  MUSCULOSKELETAL: UE and LE wasting and contractures NEUROLOGIC: Cranial nerves 2-12 grossly intact; understands everything,can communicate, can't speak; some movement of UE PSYCHIATRIC: Mood and affect appropriate to situation, no behavioral issues  Patient Active Problem List   Diagnosis Date Noted  . Itchy eyes 11/12/2015  . GERD (gastroesophageal reflux disease) 03/17/2015  . Tinea cruris 09/10/2014  . Aspiration pneumonia (HCC) 08/15/2014  . Abscess, prostate 08/15/2014  .  Pyelonephritis 08/06/2014  . Nausea & vomiting   . Fever presenting with conditions classified elsewhere 03/16/2014  . Altered mental status 03/16/2014  . Acute bronchiolitis due to unspecified organism 03/11/2014  . Enteritis due to Clostridium difficile 03/11/2014  . Type 2 diabetes mellitus without complication (HCC) 03/11/2014  . Septic shock (HCC) 01/26/2014  . Sepsis (HCC) 01/25/2014  . Acute encephalopathy 01/02/2014  . Acute respiratory failure with hypercapnia (HCC) 01/02/2014  . Chronic anemia 01/02/2014  . Subdural hematoma (HCC) 01/02/2014  . Pain in thoracic spine 12/26/2013  . PEG (percutaneous endoscopic gastrostomy) status (HCC) 10/04/2013  . Rash and nonspecific skin eruption 07/08/2013  . Thrush 07/08/2013  . Heme positive stool 07/07/2013  . Severe sepsis (HCC) 06/23/2013  . E. coli pyelonephritis 06/22/2013  . SIRS (systemic inflammatory response syndrome) (HCC) 06/19/2013  . Leukocytosis 06/19/2013  . Bronchitis 06/19/2013  . Dysphagia 06/19/2013  . Aphasia 06/19/2013  . Other convulsions 08/10/2012  . Iron deficiency anemia 08/10/2012  . Glucocorticoid deficiency (HCC) 08/10/2012  .  Febrile illness, acute 06/15/2012  . UTI (lower urinary tract infection) 06/15/2012  . Hypokalemia 11/10/2011  . Diabetic gastroparesis (HCC) 11/08/2011  . Ileus (HCC) 11/08/2011  . Hyponatremia 11/08/2011  . Adrenal insufficiency (HCC) 11/08/2011  . Quadriplegia (HCC) 11/08/2011  . Parkinson's disease (HCC)   . Seizures (HCC)   . Depression     CBC    Component Value Date/Time   WBC 6.2 08/10/2015   WBC 6.3 11/26/2014 0407   RBC 4.33 11/26/2014 0407   HGB 10.5 (A) 08/10/2015   HCT 33 (A) 08/10/2015   PLT 239 08/10/2015   MCV 72.7 (L) 11/26/2014 0407   LYMPHSABS 2.2 11/26/2014 0407   MONOABS 0.8 11/26/2014 0407   EOSABS 0.4 11/26/2014 0407   BASOSABS 0.0 11/26/2014 0407    CMP     Component Value Date/Time   NA 136 (A) 08/10/2015   K 4.1 08/10/2015   CL  96 (L) 11/26/2014 0407   CO2 27 11/26/2014 0407   GLUCOSE 93 11/26/2014 0407   BUN 16 08/10/2015   CREATININE 0.6 08/10/2015   CREATININE 0.53 (L) 11/26/2014 0407   CALCIUM 8.5 (L) 11/26/2014 0407   PROT 7.7 11/26/2014 0407   ALBUMIN 3.0 (L) 11/26/2014 0407   AST 23 08/10/2015   ALT 16 08/10/2015   ALKPHOS 141 (A) 08/10/2015   BILITOT <0.1 (L) 11/26/2014 0407   GFRNONAA >60 11/26/2014 0407   GFRAA >60 11/26/2014 0407    Assessment and Plan  Diabetic gastroparesis No reports of refluxx or aspiration;plan to cont reglan QID per tibe  Dysphagia Permanent PEG 2/2 dysphagia from intracerebral bleed; will monitir function  GERD (gastroesophageal reflux disease) Pt is always at high risk of aspiration from PEG tube;plan to cont protonix 40 mg BID   Randon Goldsmith. Lyn Hollingshead, MD

## 2015-11-28 ENCOUNTER — Encounter: Payer: Self-pay | Admitting: Internal Medicine

## 2015-11-28 NOTE — Assessment & Plan Note (Signed)
No reports of refluxx or aspiration;plan to cont reglan QID per tibe

## 2015-11-28 NOTE — Assessment & Plan Note (Signed)
Pt is always at high risk of aspiration from PEG tube;plan to cont protonix 40 mg BID

## 2015-11-28 NOTE — Assessment & Plan Note (Signed)
Permanent PEG 2/2 dysphagia from intracerebral bleed; will monitir function

## 2015-12-28 ENCOUNTER — Non-Acute Institutional Stay (SKILLED_NURSING_FACILITY): Payer: Medicare Other | Admitting: Internal Medicine

## 2015-12-28 ENCOUNTER — Encounter: Payer: Self-pay | Admitting: Internal Medicine

## 2015-12-28 DIAGNOSIS — G8250 Quadriplegia, unspecified: Secondary | ICD-10-CM

## 2015-12-28 DIAGNOSIS — R569 Unspecified convulsions: Secondary | ICD-10-CM | POA: Diagnosis not present

## 2015-12-28 DIAGNOSIS — D649 Anemia, unspecified: Secondary | ICD-10-CM | POA: Diagnosis not present

## 2015-12-28 NOTE — Progress Notes (Signed)
Location:  [facility] Number: 214D Place of Service:  SNF [phone])  [name]. [name], MD  Patient Care Team: No Pcp Per Patient as PCP - General (General Practice)  Extended Emergency Contact Information Primary Emergency Contact: Paulsen,[name] Address: [address]          [place], [place] [address] [name] Phone: [phone] Mobile Phone: [phone] Relation: Mother Secondary Emergency Contact: Constance Haw [place] Phone: [phone] Relation: Brother Father: Kwame, [place] Mobile Phone: [phone]    Allergies: Aspirin; Keflex [cephalexin]; Nsaids; and Penicillins  Chief Complaint  Patient presents with  . Medical Management of Chronic Issues    Routine Visit    HPI: Patient is 60 y.o. male who is being seen for routine issues of of anemia, seizures and quadriplegia.  Past Medical History:  Diagnosis Date  . Adrenal insufficiency ([place])   . Anemia   . Aphasia 06/19/2013  . Depression   . Dysphagia 06/19/2013  . Parkinson's disease ([place])   . Quadriplegia ([place])   . Seizures ([place])     Past Surgical History:  Procedure Laterality Date  . PEG TUBE PLACEMENT    . PERIPHERALLY INSERTED CENTRAL CATHETER INSERTION    . TONSILLECTOMY  1962      Medication List       Accurate as of 12/28/15 11:59 PM. Always use your most recent med list.          acetaminophen 325 MG tablet Commonly known as:  TYLENOL Place 2 tablets (650 mg total) into feeding tube every 6 (six) hours as needed for fever (Give 2 tablets via g-tube every 6 hours as needed for a fever greater than 101 degrees F).   baclofen 10 MG tablet Commonly known as:  LIORESAL Place 1 tablet (10 mg total) into feeding tube 3 (three) times daily.   bisacodyl 10 MG suppository Commonly known as:  DULCOLAX Place 10 mg rectally daily as needed for moderate constipation. If constipation not  relieved by milk of magnesia give one 10 mg suppository in 24hours   CENTAMIN Liqd 5 mLs by PEG Tube route daily.   CETAPHIL GENTLE CLEANSER Liqd Apply to face and neck with water and then pat dry daily.   ferrous sulfate 220 (44 Fe) MG/5ML solution Place 330 mg into feeding tube See admin instructions. Give 7.27ml  Per tube every morning   folic acid 1 MG tablet Commonly known as:  FOLVITE 1 mg by PEG Tube route daily.   loratadine 10 MG tablet Commonly known as:  CLARITIN 10 mg by PEG Tube route daily.   magnesium hydroxide 400 MG/5ML suspension Commonly known as:  MILK OF MAGNESIA Take 30 mLs by mouth daily as needed for mild constipation.   metoCLOPramide 10 MG tablet Commonly known as:  REGLAN Place 1 tablet (10 mg total) into feeding tube 4 (four) times daily -  before meals and at bedtime. Via Peg tube   NUTRITIONAL SUPPLEMENTS PO Give Two Cal HN at 55cc/hr x 20 hrs via peg ( HOLD 1 HOUR PRE/POST PHENTON ADMINISTRATION)   olopatadine 0.1 % ophthalmic solution Commonly known as:  PATANOL Place 1 drop into both eyes every morning.   pantoprazole 40 MG tablet Commonly known as:  PROTONIX Give 40 mg by tube 2 (two) times daily. Dilute as instructed   phenytoin 125 MG/5ML suspension Commonly known as:  DILANTIN Place 4 mLs (100 mg total)  into feeding tube 2 (two) times daily.   polyvinyl alcohol 1.4 % ophthalmic solution Commonly known as:  LIQUIFILM TEARS Place 1 drop into both eyes daily at 6 (six) AM.   prednisoLONE 15 MG/5ML Soln Commonly known as:  PRELONE Place into feeding tube. Give 6 mg every morning and 4 mg every evening at 6 pm   RA SALINE ENEMA 19-7 GM/118ML Enem Place 1 each rectally as needed (for constipation).   senna 8.6 MG Tabs tablet Commonly known as:  SENOKOT Place 1 tablet into feeding tube at bedtime. Hold for diarrhea       No orders of the defined types were placed in this encounter.   Immunization History  Administered  Date(s) Administered  . Influenza Whole 12/31/2012  . Influenza-Unspecified 12/29/2011, 12/31/2012, 01/07/2014, 12/22/2014  . PPD Test 08/22/2008  . Pneumococcal-Unspecified 06/23/2010, 06/23/2015    Social History  Substance Use Topics  . Smoking status: Never Smoker  . Smokeless tobacco: Never Used  . Alcohol use No    Review of Systems  DATA OBTAINED: from patient thru nurse GENERAL:  no fevers, fatigue, appetite changes SKIN: No itching, rash HEENT: No complaint RESPIRATORY: No cough, wheezing, SOB CARDIAC: No chest pain, palpitations, lower extremity edema  GI: No abdominal pain, No N/V/D or constipation, No heartburn or reflux  GU: No dysuria, frequency or urgency, or incontinence  MUSCULOSKELETAL: No unrelieved bone/joint pain NEUROLOGIC: No headache, dizziness  PSYCHIATRIC: No overt anxiety or sadness  Vitals:   12/28/15 1100  BP: 118/73  Pulse: 70  Resp: 18  Temp: 97.5 F (36.4 C)   Body mass index is 21.23 kg/m. Physical Exam  GENERAL APPEARANCE: Alert,can communicate but can't speak, No acute distress  SKIN: No diaphoresis rash HEENT: Unremarkable RESPIRATORY: Breathing is even, unlabored. Lung sounds are clear   CARDIOVASCULAR: Heart RRR no murmurs, rubs or gallops. No peripheral edema  GASTROINTESTINAL: Abdomen is soft, non-tender, not distended w/ normal bowel sounds; PEG tube  GENITOURINARY: Bladder non tender, not distended  MUSCULOSKELETAL: wasting and contractures NEUROLOGIC: Cranial nerves 2-12 grossly intact; quadriplegia PSYCHIATRIC: Mood and affect appropriate to situation, no behavioral issues  Patient Active Problem List   Diagnosis Date Noted  . Itchy eyes 11/12/2015  . GERD (gastroesophageal reflux disease) 03/17/2015  . Tinea cruris 09/10/2014  . Aspiration pneumonia ([place]) 08/15/2014  . Abscess, prostate 08/15/2014  . Pyelonephritis 08/06/2014  . Nausea & vomiting   . Fever presenting with conditions classified elsewhere  03/16/2014  . Altered mental status 03/16/2014  . Acute bronchiolitis due to unspecified organism 03/11/2014  . Enteritis due to Clostridium difficile 03/11/2014  . Type 2 diabetes mellitus without complication ([place]) [id number]  . Septic shock ([place]) 01/26/2014  . Sepsis ([place]) 01/25/2014  . Acute encephalopathy 01/02/2014  . Acute respiratory failure with hypercapnia ([place]) 01/02/2014  . Chronic anemia 01/02/2014  . Subdural hematoma ([place]) 01/02/2014  . Pain in thoracic spine 12/26/2013  . PEG (percutaneous endoscopic gastrostomy) status ([place]) 10/04/2013  . Rash and nonspecific skin eruption 07/08/2013  . Thrush 07/08/2013  . Heme positive stool 07/07/2013  . Severe sepsis ([place]) 06/23/2013  . E. coli pyelonephritis 06/22/2013  . SIRS (systemic inflammatory response syndrome) ([place]) 06/19/2013  . Leukocytosis 06/19/2013  . Bronchitis 06/19/2013  . Dysphagia 06/19/2013  . Aphasia 06/19/2013  . Other convulsions 08/10/2012  . Iron deficiency anemia 08/10/2012  . Glucocorticoid deficiency ([place]) 08/10/2012  . Febrile illness, acute 06/15/2012  . UTI (lower urinary tract infection) 06/15/2012  . Hypokalemia 11/10/2011  .  Diabetic gastroparesis ([place]) 11/08/2011  . Ileus ([place]) 11/08/2011  . Hyponatremia 11/08/2011  . Adrenal insufficiency ([place]) 11/08/2011  . Quadriplegia ([place]) 11/08/2011  . Parkinson's disease ([place])   . Seizures ([place])   . Depression     CMP     Component Value Date/Time   NA 131 (L) 01/11/2016 0353   NA 136 (A) 08/10/2015   K 3.7 01/11/2016 0353   CL 99 (L) 01/11/2016 0353   CO2 25 01/11/2016 0353   GLUCOSE 90 01/11/2016 0353   BUN 17 01/11/2016 0353   BUN 16 08/10/2015   CREATININE 0.52 (L) 01/11/2016 0353   CALCIUM 8.3 (L) 01/11/2016 0353   PROT 7.7 01/11/2016 0353   ALBUMIN 3.3 (L) 01/11/2016 0353   AST 37 01/11/2016 0353   ALT 27 01/11/2016 0353   ALKPHOS 114 01/11/2016 0353   BILITOT 0.3 01/11/2016 0353   GFRNONAA >60 01/11/2016 0353   GFRAA >60  01/11/2016 0353    Recent Labs  08/03/15 08/10/15 01/11/16 0353  NA 140 136* 131*  K 4.5 4.1 3.7  CL  --   --  99*  CO2  --   --  25  GLUCOSE  --   --  90  BUN 18 16 17   CREATININE 0.6 0.6 0.52*  CALCIUM  --   --  8.3*    Recent Labs  08/03/15 08/10/15 01/11/16 0353  AST 24 23 37  ALT 15 16 27   ALKPHOS  --  141* 114  BILITOT  --   --  0.3  PROT  --   --  7.7  ALBUMIN  --   --  3.3*    Recent Labs  05/01/15 08/10/15 01/11/16 0353  WBC 6.0  5.0 6.2 8.7  NEUTROABS  --   --  5.8  HGB 10.3*  10.3* 10.5* 11.0*  HCT 32*  32* 33* 32.9*  MCV  --   --  69.6*  PLT 195  195 239 192    Recent Labs  08/03/15 08/10/15  CHOL 150 157  LDLCALC  --  76  TRIG  --  75   Lab Results  Component Value Date   MICROALBUR 29.1 10/09/2015   Lab Results  Component Value Date   TSH 1.37 08/10/2015   Lab Results  Component Value Date   HGBA1C 5.3 08/10/2015   Lab Results  Component Value Date   CHOL 157 08/10/2015   HDL 66 08/10/2015   LDLCALC 76 08/10/2015   TRIG 75 08/10/2015    Significant Diagnostic Results in last 30 days:  Dg Chest Port 1 View  Result Date: 01/11/2016 CLINICAL DATA:  60 year old male with fever EXAM: PORTABLE CHEST 1 VIEW COMPARISON:  Chest radiograph dated 08/08/2014 FINDINGS: Mild emphysematous changes of the lungs with interstitial coarsening. There is mild elevation of the right hemidiaphragm. No focal consolidation, pleural effusion, or pneumothorax. The cardiac silhouette is within normal limits. No acute osseous pathology. IMPRESSION: No active disease. Electronically Signed   By: Anner Crete M.D.   On: 01/11/2016 04:19    Assessment and Plan  Chronic anemia Most recent Hb is 11 which is good for pt , but MCV is 69.9; plan to contiue iron  Seizures No reported seizures; plan to cont dilantin; have ordered dilantin level  Quadriplegia Chronic, stable, pt has some limited movement of upper extremities;plan to cont baclofen for  contractures     Saia Derossett D. [name], MD

## 2016-01-11 ENCOUNTER — Emergency Department (HOSPITAL_COMMUNITY)
Admission: EM | Admit: 2016-01-11 | Discharge: 2016-01-11 | Disposition: A | Discharge status: Home / Self Care | Payer: Medicare Other | Attending: Emergency Medicine | Admitting: Emergency Medicine

## 2016-01-11 ENCOUNTER — Emergency Department (HOSPITAL_COMMUNITY): Payer: Medicare Other

## 2016-01-11 DIAGNOSIS — R509 Fever, unspecified: Secondary | ICD-10-CM | POA: Diagnosis not present

## 2016-01-11 DIAGNOSIS — Z79899 Other long term (current) drug therapy: Secondary | ICD-10-CM | POA: Diagnosis not present

## 2016-01-11 DIAGNOSIS — E119 Type 2 diabetes mellitus without complications: Secondary | ICD-10-CM | POA: Diagnosis not present

## 2016-01-11 DIAGNOSIS — N390 Urinary tract infection, site not specified: Secondary | ICD-10-CM | POA: Diagnosis not present

## 2016-01-11 DIAGNOSIS — G20 Parkinson's disease: Secondary | ICD-10-CM | POA: Diagnosis not present

## 2016-01-11 DIAGNOSIS — B999 Unspecified infectious disease: Secondary | ICD-10-CM | POA: Diagnosis not present

## 2016-01-11 LAB — CBC WITH DIFFERENTIAL/PLATELET
BASOS ABS: 0 10*3/uL (ref 0.0–0.1)
Basophils Relative: 0 %
Eosinophils Absolute: 0.2 10*3/uL (ref 0.0–0.7)
Eosinophils Relative: 2 %
HEMATOCRIT: 32.9 % — AB (ref 39.0–52.0)
HEMOGLOBIN: 11 g/dL — AB (ref 13.0–17.0)
LYMPHS PCT: 21 %
Lymphs Abs: 1.8 10*3/uL (ref 0.7–4.0)
MCH: 23.3 pg — ABNORMAL LOW (ref 26.0–34.0)
MCHC: 33.4 g/dL (ref 30.0–36.0)
MCV: 69.6 fL — ABNORMAL LOW (ref 78.0–100.0)
MONOS PCT: 10 %
Monocytes Absolute: 0.9 10*3/uL (ref 0.1–1.0)
NEUTROS ABS: 5.8 10*3/uL (ref 1.7–7.7)
Neutrophils Relative %: 67 %
Platelets: 192 10*3/uL (ref 150–400)
RBC: 4.73 MIL/uL (ref 4.22–5.81)
RDW: 15.2 % (ref 11.5–15.5)
WBC: 8.7 10*3/uL (ref 4.0–10.5)

## 2016-01-11 LAB — URINE MICROSCOPIC-ADD ON

## 2016-01-11 LAB — URINALYSIS, ROUTINE W REFLEX MICROSCOPIC
Bilirubin Urine: NEGATIVE
Glucose, UA: NEGATIVE mg/dL
Ketones, ur: NEGATIVE mg/dL
NITRITE: NEGATIVE
PH: 8.5 — AB (ref 5.0–8.0)
Protein, ur: 100 mg/dL — AB
SPECIFIC GRAVITY, URINE: 1.021 (ref 1.005–1.030)

## 2016-01-11 LAB — I-STAT CG4 LACTIC ACID, ED: Lactic Acid, Venous: 0.75 mmol/L (ref 0.5–1.9)

## 2016-01-11 LAB — COMPREHENSIVE METABOLIC PANEL
ALBUMIN: 3.3 g/dL — AB (ref 3.5–5.0)
ALT: 27 U/L (ref 17–63)
ANION GAP: 7 (ref 5–15)
AST: 37 U/L (ref 15–41)
Alkaline Phosphatase: 114 U/L (ref 38–126)
BILIRUBIN TOTAL: 0.3 mg/dL (ref 0.3–1.2)
BUN: 17 mg/dL (ref 6–20)
CHLORIDE: 99 mmol/L — AB (ref 101–111)
CO2: 25 mmol/L (ref 22–32)
Calcium: 8.3 mg/dL — ABNORMAL LOW (ref 8.9–10.3)
Creatinine, Ser: 0.52 mg/dL — ABNORMAL LOW (ref 0.61–1.24)
GFR calc Af Amer: >60 mL/min (ref >60)
GFR calc non Af Amer: >60 mL/min (ref >60)
GLUCOSE: 90 mg/dL (ref 65–99)
POTASSIUM: 3.7 mmol/L (ref 3.5–5.1)
Sodium: 131 mmol/L — ABNORMAL LOW (ref 135–145)
TOTAL PROTEIN: 7.7 g/dL (ref 6.5–8.1)

## 2016-01-11 MED ORDER — LEVOFLOXACIN 750 MG PO TABS: 750.0000 mg | ORAL_TABLET | Freq: Every day | ORAL | 0 refills | Status: DC

## 2016-01-11 MED ORDER — LEVOFLOXACIN IN D5W 750 MG/150ML IV SOLN
750.0000 mg | Freq: Once | INTRAVENOUS | Status: AC
  Administered 2016-01-11: 750 mg via INTRAVENOUS
  Filled 2016-01-11: qty 150

## 2016-01-11 NOTE — ED Provider Notes (Signed)
[facility] DEPT Provider Note   CSN: [id number] Arrival date & time: 01/11/16  [id number]  By signing my name below, I, Higinio Plan, attest that this documentation has been prepared under the direction and in the presence of Veryl Speak, MD . Electronically Signed: Higinio Plan, Scribe. 01/11/2016. 3:48 AM.  History   Chief Complaint Chief Complaint  Patient presents with  . Fever  . Emesis   The history is provided by medical records and the nursing home. No language interpreter was used.    LEVEL 5 CAVEAT: HPI and ROS limited due to a traumatic brain injury  HPI Comments: Donyae Tripp is a 60 y.o. male with PMHx of quadriplegia, brought in by EMS to the Emergency Department from his nursing home, for an evaluation of gradually worsening, fever (TMAX 102.3). Per EMS, pt lso had one episode of vomiting at ~1:30 AM this morning en route to the ED. Pt's nursing home reports pt's POA decided to bring pt to the ED for an evaluation.    Past Medical History:  Diagnosis Date  . Adrenal insufficiency ([place])   . Anemia   . Aphasia 06/19/2013  . Depression   . Dysphagia 06/19/2013  . Parkinson's disease ([place])   . Quadriplegia ([place])   . Seizures [facility])     Patient Active Problem List   Diagnosis Date Noted  . Itchy eyes 11/12/2015  . GERD (gastroesophageal reflux disease) 03/17/2015  . Tinea cruris 09/10/2014  . Aspiration pneumonia ([place]) 08/15/2014  . Abscess, prostate 08/15/2014  . Pyelonephritis 08/06/2014  . Nausea & vomiting   . Fever presenting with conditions classified elsewhere 03/16/2014  . Altered mental status 03/16/2014  . Acute bronchiolitis due to unspecified organism 03/11/2014  . Enteritis due to Clostridium difficile 03/11/2014  . Type 2 diabetes mellitus without complication ([place]) [id number]  . Septic shock ([place]) 01/26/2014  . Sepsis ([place]) 01/25/2014  . Acute encephalopathy 01/02/2014  . Acute respiratory failure with hypercapnia ([place]) 01/02/2014  . Chronic  anemia 01/02/2014  . Subdural hematoma ([place]) 01/02/2014  . Pain in thoracic spine 12/26/2013  . PEG (percutaneous endoscopic gastrostomy) status ([place]) 10/04/2013  . Rash and nonspecific skin eruption 07/08/2013  . Thrush 07/08/2013  . Heme positive stool 07/07/2013  . Severe sepsis ([place]) 06/23/2013  . E. coli pyelonephritis 06/22/2013  . SIRS (systemic inflammatory response syndrome) ([place]) 06/19/2013  . Leukocytosis 06/19/2013  . Bronchitis 06/19/2013  . Dysphagia 06/19/2013  . Aphasia 06/19/2013  . Other convulsions 08/10/2012  . Iron deficiency anemia 08/10/2012  . Glucocorticoid deficiency ([place]) 08/10/2012  . Febrile illness, acute 06/15/2012  . UTI (lower urinary tract infection) 06/15/2012  . Hypokalemia 11/10/2011  . Diabetic gastroparesis ([place]) 11/08/2011  . Ileus ([place]) 11/08/2011  . Hyponatremia 11/08/2011  . Adrenal insufficiency ([place]) 11/08/2011  . Quadriplegia ([place]) 11/08/2011  . Parkinson's disease ([place])   . Seizures ([place])   . Depression     Past Surgical History:  Procedure Laterality Date  . PEG TUBE PLACEMENT    . PERIPHERALLY INSERTED CENTRAL CATHETER INSERTION    . TONSILLECTOMY  1962    Home Medications    Prior to Admission medications   Medication Sig Start Date End Date Taking? Authorizing Provider  acetaminophen (TYLENOL) 325 MG tablet Place 2 tablets (650 mg total) into feeding tube every 6 (six) hours as needed for fever (Give 2 tablets via g-tube every 6 hours as needed for a fever greater than 101 degrees F). 08/12/14   Shanker Kristeen Mans, MD  baclofen (  LIORESAL) 10 MG tablet Place 1 tablet (10 mg total) into feeding tube 3 (three) times daily. 08/12/14   Shanker Kristeen Mans, MD  bisacodyl (DULCOLAX) 10 MG suppository Place 10 mg rectally daily as needed for moderate constipation. If constipation not relieved by milk of magnesia give one 10 mg suppository in 24hours    Historical Provider, MD  ferrous sulfate 220 (44 FE) MG/5ML solution Place 330 mg  into feeding tube See admin instructions. Give 7.55ml  Per tube every morning    Historical Provider, MD  folic acid (FOLVITE) 1 MG tablet 1 mg by PEG Tube route daily.    Historical Provider, MD  loratadine (CLARITIN) 10 MG tablet 10 mg by PEG Tube route daily.    Historical Provider, MD  magnesium hydroxide (MILK OF MAGNESIA) 400 MG/5ML suspension Take 30 mLs by mouth daily as needed for mild constipation.    Historical Provider, MD  metoCLOPramide (REGLAN) 10 MG tablet Place 1 tablet (10 mg total) into feeding tube 4 (four) times daily -  before meals and at bedtime. Via Peg tube 08/12/14   Jonetta Osgood, MD  Multiple Vitamins-Minerals (CENTAMIN) LIQD 5 mLs by PEG Tube route daily.     Historical Provider, MD  NUTRITIONAL SUPPLEMENTS PO Give Two Cal HN at 55cc/hr x 20 hrs via peg ( HOLD 1 HOUR PRE/POST PHENTON ADMINISTRATION)    Historical Provider, MD  olopatadine (PATANOL) 0.1 % ophthalmic solution Place 1 drop into both eyes every morning.    Historical Provider, MD  pantoprazole (PROTONIX) 40 MG tablet Give 40 mg by tube 2 (two) times daily.     Historical Provider, MD  phenytoin (DILANTIN) 125 MG/5ML suspension Place 4 mLs (100 mg total) into feeding tube 2 (two) times daily. 03/13/14   Shanker Kristeen Mans, MD  polyvinyl alcohol (LIQUIFILM TEARS) 1.4 % ophthalmic solution Place 1 drop into both eyes daily at 6 (six) AM.    Historical Provider, MD  prednisoLONE (PRELONE) 15 MG/5ML SOLN Place into feeding tube. Give 6 mg every morning and 4 mg every evening at 6 pm    Historical Provider, MD  senna (SENOKOT) 8.6 MG TABS tablet Place 1 tablet into feeding tube at bedtime. Hold for diarrhea    Historical Provider, MD  Soap & Cleansers (CETAPHIL GENTLE CLEANSER) LIQD Apply to face and neck with water and then pat dry daily.    Historical Provider, MD  Sodium Phosphates (RA SALINE ENEMA) 19-7 GM/118ML ENEM Place 1 each rectally as needed (for constipation).    Historical Provider, MD    Family  History Family History  Problem Relation Age of Onset  . Hypothyroidism Mother   . Hypertension Mother   . Diabetes Father     Social History Social History  Substance Use Topics  . Smoking status: Never Smoker  . Smokeless tobacco: Never Used  . Alcohol use No     Allergies   Aspirin; Keflex [cephalexin]; Nsaids; and Penicillins   Review of Systems Review of Systems  Unable to perform ROS: Patient nonverbal   Physical Exam Updated Vital Signs BP 112/62 (BP Location: Left Arm)   Pulse 99   Temp 103 F (39.4 C) (Rectal)   Resp 22   SpO2 95%   Physical Exam  Constitutional:  Pt appears chronically ill and debilitated.  HENT:  Head: Normocephalic and atraumatic.  Eyes: EOM are normal.  Neck: Normal range of motion.  Cardiovascular: Normal rate, regular rhythm, normal heart sounds and intact distal pulses.  Pulmonary/Chest: Effort normal and breath sounds normal. No respiratory distress.  Abdominal: Soft. He exhibits no distension. There is no tenderness.  G tube is in place to the LUQ. No surrounding erythema or drainage.  Musculoskeletal: Normal range of motion.  There are contractures of all extremities along with muscle wasting.   Neurological:  Neuro exam is difficult secondary to pt's baseline medical condition. He does attempt to respond to verbal stimuli with incomprehensible sounds.   Skin: Skin is warm and dry.  Psychiatric: He has a normal mood and affect. Judgment normal.  Nursing note and vitals reviewed.  ED Treatments / Results  Labs (all labs ordered are listed, but only abnormal results are displayed) Labs Reviewed - No data to display  EKG  EKG Interpretation None       Radiology No results found.  Procedures Procedures (including critical care time)  Medications Ordered in ED Medications - No data to display  DIAGNOSTIC STUDIES:  Oxygen Saturation is 95% on RA, normal by my interpretation.    COORDINATION OF CARE:  3:42  AM Discussed treatment plan with pt at bedside and pt agreed to plan.  Initial Impression / Assessment and Plan / ED Course  I have reviewed the triage vital signs and the nursing notes.  Pertinent labs & imaging results that were available during my care of the patient were reviewed by me and considered in my medical decision making (see chart for details).  Clinical Course    Patient with history of prior brain injury with quadriplegia. He was sent from his nursing facility for evaluation of vomiting and fever. He has little additional history secondary to his baseline neurologic status. Additional history was taken from his mother who is present at bedside.  His workup today reveals an initial fever of 103 which improved with Tylenol. He has no hypotension, tachycardia, leukocytosis. No definite source for the fever has been found. There is nothing on his chest x-ray or physical examination that would produce an obvious source. His urinalysis is suggestive of a UTI. He will be treated with Levaquin and discharged to the extended care facility with a prescription for the same.  Cultures of the blood and urine are pending. If these indicate a change in therapy is necessary, the nursing facility will be notified and the appropriate modifications will be made.  I personally performed the services described in this documentation, which was scribed in my presence. The recorded information has been reviewed and is accurate.   Final Clinical Impressions(s) / ED Diagnoses   Final diagnoses:  None    New Prescriptions New Prescriptions   No medications on file     Veryl Speak, MD 01/11/16 [phone]

## 2016-01-11 NOTE — ED Notes (Addendum)
Per EMS- Pt. From [facility] vomited at 1:30 and was found to have a fever of 102.3. Increased respiratory rate.

## 2016-01-11 NOTE — Discharge Instructions (Signed)
Levaquin as prescribed.  Tylenol 1000mg  rotated with motrin 600mg  every four hours as needed for fever.  We will call you if your cultures suggest you need a change in your therapy.

## 2016-01-11 NOTE — ED Notes (Signed)
[facility]

## 2016-01-11 NOTE — ED Notes (Signed)
Bed: RESB Expected date:  Expected time:  Means of arrival:  Comments: 60yo M Fever

## 2016-01-11 NOTE — ED Notes (Signed)
PTAR on way.

## 2016-01-12 LAB — URINE CULTURE: SPECIAL REQUESTS: NORMAL

## 2016-01-15 ENCOUNTER — Encounter: Payer: Self-pay | Admitting: Internal Medicine

## 2016-01-15 ENCOUNTER — Non-Acute Institutional Stay (SKILLED_NURSING_FACILITY): Payer: Medicare Other | Admitting: Internal Medicine

## 2016-01-15 DIAGNOSIS — R05 Cough: Secondary | ICD-10-CM

## 2016-01-15 DIAGNOSIS — R059 Cough, unspecified: Secondary | ICD-10-CM

## 2016-01-15 NOTE — Progress Notes (Signed)
Location:  [facility] Room Number: 214D Place of Service:  SNF [phone])  [name]. [name], MD  Patient Care Team: No Pcp Per Patient as PCP - General (General Practice)  Extended Emergency Contact Information Primary Emergency Contact: Ruffolo,[name] Address: [address]          [place], [place] [address] [name] Phone: [phone] Mobile Phone: [phone] Relation: Mother Secondary Emergency Contact: Constance Haw [place] Phone: [phone] Relation: Brother Father: Seyon, Cordone [place] Mobile Phone: [phone]    Allergies: Aspirin; Keflex [cephalexin]; Nsaids; and Penicillins  Chief Complaint  Patient presents with  . Acute Visit    Acute    HPI: Patient is 60 y.o. male who nursing asked me to see today for a cough. No fever, vomiting or other systemic sx. Pt says he feels fine.Pt was seen in the ED on 10/16 for a fever and vomitng X1 and was d/c on 7 days of levaquin for a presumed UTI.  Past Medical History:  Diagnosis Date  . Adrenal insufficiency ([place])   . Anemia   . Aphasia 06/19/2013  . Depression   . Dysphagia 06/19/2013  . Parkinson's disease ([place])   . Quadriplegia ([place])   . Seizures ([place])     Past Surgical History:  Procedure Laterality Date  . PEG TUBE PLACEMENT    . PERIPHERALLY INSERTED CENTRAL CATHETER INSERTION    . TONSILLECTOMY  1962      Medication List       Accurate as of 01/15/16 12:43 PM. Always use your most recent med list.          acetaminophen 325 MG tablet Commonly known as:  TYLENOL Place 650 mg into feeding tube every 6 (six) hours as needed. Fever > 101   baclofen 10 MG tablet Commonly known as:  LIORESAL Place 1 tablet (10 mg total) into feeding tube 3 (three) times daily.   bisacodyl 10 MG suppository Commonly known as:  DULCOLAX Place 10 mg rectally daily as needed for moderate constipation. If constipation  not relieved by milk of magnesia give one 10 mg suppository in 24hours   CENTAMIN Liqd 5 mLs by PEG Tube route daily.   CETAPHIL GENTLE CLEANSER Liqd Apply to face and neck with water and then pat dry daily.   ferrous sulfate 220 (44 Fe) MG/5ML solution Place 330 mg into feeding tube See admin instructions. Give 7.73ml  Per tube every morning   folic acid 1 MG tablet Commonly known as:  FOLVITE 1 mg by PEG Tube route daily.   levofloxacin 750 MG tablet Commonly known as:  LEVAQUIN Take 1 tablet (750 mg total) by mouth daily. X 7 days   loratadine 10 MG tablet Commonly known as:  CLARITIN 10 mg by PEG Tube route daily.   magnesium hydroxide 400 MG/5ML suspension Commonly known as:  MILK OF MAGNESIA Take 30 mLs by mouth daily as needed for mild constipation.   metoCLOPramide 10 MG tablet Commonly known as:  REGLAN Place 1 tablet (10 mg total) into feeding tube 4 (four) times daily -  before meals and at bedtime. Via Peg tube   NUTRITIONAL SUPPLEMENTS PO Give Two Cal HN at 55cc/hr x 20 hrs via peg ( HOLD 1 HOUR PRE/POST PHENTON ADMINISTRATION)   olopatadine 0.1 % ophthalmic solution Commonly known as:  PATANOL Place 1 drop into both eyes every morning.   pantoprazole 40 MG tablet Commonly  known as:  PROTONIX Give 40 mg by tube 2 (two) times daily. Dilute as instructed   phenytoin 125 MG/5ML suspension Commonly known as:  DILANTIN Place 4 mLs (100 mg total) into feeding tube 2 (two) times daily.   polyvinyl alcohol 1.4 % ophthalmic solution Commonly known as:  LIQUIFILM TEARS Place 1 drop into both eyes daily at 6 (six) AM.   prednisoLONE 15 MG/5ML Soln Commonly known as:  PRELONE Place into feeding tube. Give 6 mg every morning and 4 mg every evening at 6 pm   RA SALINE ENEMA 19-7 GM/118ML Enem Place 1 each rectally as needed (for constipation).   sterile water for irrigation Flush 269ml of water into peg tub every 4 hours       Meds ordered this encounter    Medications  . acetaminophen (TYLENOL) 325 MG tablet    Sig: Place 650 mg into feeding tube every 6 (six) hours as needed. Fever > 101  . Water For Irrigation, Sterile (STERILE WATER FOR IRRIGATION)    Sig: Flush 238ml of water into peg tub every 4 hours    Immunization History  Administered Date(s) Administered  . Influenza Whole 12/31/2012  . Influenza-Unspecified 12/29/2011, 12/31/2012, 01/07/2014, 12/22/2014  . PPD Test 08/22/2008  . Pneumococcal-Unspecified 06/23/2010, 06/23/2015    Social History  Substance Use Topics  . Smoking status: Never Smoker  . Smokeless tobacco: Never Used  . Alcohol use No    Review of Systems  DATA OBTAINED: from patient- can not fully participate; nurse GENERAL:  no fevers, fatigue, appetite changes SKIN: No itching, rash HEENT: No complaint RESPIRATORY: + cough,- wheezing,- SOB CARDIAC: No chest pain, palpitations, lower extremity edema  GI: No abdominal pain, No N/V/D or constipation, No heartburn or reflux  GU: No dysuria, frequency or urgency, or incontinence  MUSCULOSKELETAL: No unrelieved bone/joint pain NEUROLOGIC: No headache, dizziness  PSYCHIATRIC: No overt anxiety or sadness  Vitals:   01/15/16 1221  BP: 118/73  Pulse: 73  Resp: 20  Temp: 97.5 F (36.4 C)   Body mass index is 21.53 kg/m. Physical Exam  GENERAL APPEARANCE: Alert, nonconversant, No acute distress  SKIN: No diaphoresis rash HEENT: Unremarkable RESPIRATORY: Breathing is even, unlabored. Lung sounds are rhonchi; O2 sat RA 94% baseline for him  CARDIOVASCULAR: Heart RRR no murmurs, rubs or gallops. No peripheral edema  GASTROINTESTINAL: Abdomen is soft, non-tender, not distended w/ normal bowel sounds; PEG tube  GENITOURINARY: Bladder non tender, not distended  MUSCULOSKELETAL: wasting , contractures NEUROLOGIC: Cranial nerves 2-12 grossly intact; quadriplegia PSYCHIATRIC: Mood and affect appropriate to situation, no behavioral issues  Patient Active  Problem List   Diagnosis Date Noted  . Itchy eyes 11/12/2015  . GERD (gastroesophageal reflux disease) 03/17/2015  . Tinea cruris 09/10/2014  . Aspiration pneumonia ([place]) 08/15/2014  . Abscess, prostate 08/15/2014  . Pyelonephritis 08/06/2014  . Nausea & vomiting   . Fever presenting with conditions classified elsewhere 03/16/2014  . Altered mental status 03/16/2014  . Acute bronchiolitis due to unspecified organism 03/11/2014  . Enteritis due to Clostridium difficile 03/11/2014  . Type 2 diabetes mellitus without complication ([place]) [id number]  . Septic shock ([place]) 01/26/2014  . Sepsis ([place]) 01/25/2014  . Acute encephalopathy 01/02/2014  . Acute respiratory failure with hypercapnia ([place]) 01/02/2014  . Chronic anemia 01/02/2014  . Subdural hematoma ([place]) 01/02/2014  . Pain in thoracic spine 12/26/2013  . PEG (percutaneous endoscopic gastrostomy) status ([place]) 10/04/2013  . Rash and nonspecific skin eruption 07/08/2013  . Thrush 07/08/2013  .  Heme positive stool 07/07/2013  . Severe sepsis ([place]) 06/23/2013  . E. coli pyelonephritis 06/22/2013  . SIRS (systemic inflammatory response syndrome) ([place]) 06/19/2013  . Leukocytosis 06/19/2013  . Bronchitis 06/19/2013  . Dysphagia 06/19/2013  . Aphasia 06/19/2013  . Other convulsions 08/10/2012  . Iron deficiency anemia 08/10/2012  . Glucocorticoid deficiency ([place]) 08/10/2012  . Febrile illness, acute 06/15/2012  . UTI (lower urinary tract infection) 06/15/2012  . Hypokalemia 11/10/2011  . Diabetic gastroparesis ([place]) 11/08/2011  . Ileus ([place]) 11/08/2011  . Hyponatremia 11/08/2011  . Adrenal insufficiency ([place]) 11/08/2011  . Quadriplegia ([place]) 11/08/2011  . Parkinson's disease ([place])   . Seizures ([place])   . Depression     CMP     Component Value Date/Time   NA 131 (L) 01/11/2016 0353   NA 136 (A) 08/10/2015   K 3.7 01/11/2016 0353   CL 99 (L) 01/11/2016 0353   CO2 25 01/11/2016 0353   GLUCOSE 90 01/11/2016 0353   BUN 17  01/11/2016 0353   BUN 16 08/10/2015   CREATININE 0.52 (L) 01/11/2016 0353   CALCIUM 8.3 (L) 01/11/2016 0353   PROT 7.7 01/11/2016 0353   ALBUMIN 3.3 (L) 01/11/2016 0353   AST 37 01/11/2016 0353   ALT 27 01/11/2016 0353   ALKPHOS 114 01/11/2016 0353   BILITOT 0.3 01/11/2016 0353   GFRNONAA >60 01/11/2016 0353   GFRAA >60 01/11/2016 0353    Recent Labs  08/03/15 08/10/15 01/11/16 0353  NA 140 136* 131*  K 4.5 4.1 3.7  CL  --   --  99*  CO2  --   --  25  GLUCOSE  --   --  90  BUN 18 16 17   CREATININE 0.6 0.6 0.52*  CALCIUM  --   --  8.3*    Recent Labs  08/03/15 08/10/15 01/11/16 0353  AST 24 23 37  ALT 15 16 27   ALKPHOS  --  141* 114  BILITOT  --   --  0.3  PROT  --   --  7.7  ALBUMIN  --   --  3.3*    Recent Labs  05/01/15 08/10/15 01/11/16 0353  WBC 6.0  5.0 6.2 8.7  NEUTROABS  --   --  5.8  HGB 10.3*  10.3* 10.5* 11.0*  HCT 32*  32* 33* 32.9*  MCV  --   --  69.6*  PLT 195  195 239 192    Recent Labs  08/03/15 08/10/15  CHOL 150 157  LDLCALC  --  76  TRIG  --  75   Lab Results  Component Value Date   MICROALBUR 29.1 10/09/2015   Lab Results  Component Value Date   TSH 1.37 08/10/2015   Lab Results  Component Value Date   HGBA1C 5.3 08/10/2015   Lab Results  Component Value Date   CHOL 157 08/10/2015   HDL 66 08/10/2015   LDLCALC 76 08/10/2015   TRIG 75 08/10/2015    Significant Diagnostic Results in last 30 days:  Dg Chest Port 1 View  Result Date: 01/11/2016 CLINICAL DATA:  60 year old male with fever EXAM: PORTABLE CHEST 1 VIEW COMPARISON:  Chest radiograph dated 08/08/2014 FINDINGS: Mild emphysematous changes of the lungs with interstitial coarsening. There is mild elevation of the right hemidiaphragm. No focal consolidation, pleural effusion, or pneumothorax. The cardiac silhouette is within normal limits. No acute osseous pathology. IMPRESSION: No active disease. Electronically Signed   By: Anner Crete M.D.   On: 01/11/2016  04:19    Assessment and Plan  COUGH - reviewed pt's ED visit - Urine didn't grow anything, BC negative; pt has been on levaquin which would treat PNA, aspiration or other, just fine; I don't think pt has PNA but its a moot oint since he has been treated for it; no further intervention needed, will monitor    Time spent > 25 min;> 50% of time with patient was spent reviewing records, labs, tests and studies, counseling and developing plan of care  Webb Silversmith D. [name], MD

## 2016-01-16 ENCOUNTER — Encounter: Payer: Self-pay | Admitting: Internal Medicine

## 2016-01-16 LAB — CULTURE, BLOOD (ROUTINE X 2): Culture: NO GROWTH

## 2016-01-16 NOTE — Assessment & Plan Note (Signed)
Chronic, stable, pt has some limited movement of upper extremities;plan to cont baclofen for contractures

## 2016-01-16 NOTE — Assessment & Plan Note (Signed)
No reported seizures; plan to cont dilantin; have ordered dilantin level

## 2016-01-16 NOTE — Assessment & Plan Note (Signed)
Most recent Hb is 11 which is good for pt , but MCV is 69.9; plan to contiue iron

## 2016-01-21 DIAGNOSIS — Z79899 Other long term (current) drug therapy: Secondary | ICD-10-CM | POA: Diagnosis not present

## 2016-01-21 DIAGNOSIS — G20 Parkinson's disease: Secondary | ICD-10-CM | POA: Diagnosis not present

## 2016-01-21 DIAGNOSIS — D638 Anemia in other chronic diseases classified elsewhere: Secondary | ICD-10-CM | POA: Diagnosis not present

## 2016-01-21 DIAGNOSIS — E2740 Unspecified adrenocortical insufficiency: Secondary | ICD-10-CM | POA: Diagnosis not present

## 2016-01-22 DIAGNOSIS — Z23 Encounter for immunization: Secondary | ICD-10-CM | POA: Diagnosis not present

## 2016-01-25 LAB — BASIC METABOLIC PANEL
BUN: 18 mg/dL (ref 4–21)
Creatinine: 0.5 mg/dL — AB (ref 0.6–1.3)
GLUCOSE: 91 mg/dL
Potassium: 4.4 mmol/L (ref 3.4–5.3)
SODIUM: 134 mmol/L — AB (ref 137–147)

## 2016-01-25 LAB — LIPID PANEL
Cholesterol: 138 mg/dL (ref 0–200)
HDL: 64 mg/dL (ref 35–70)
LDL CALC: 62 mg/dL
Triglycerides: 60 mg/dL (ref 40–160)

## 2016-01-25 LAB — HEPATIC FUNCTION PANEL
ALT: 17 U/L (ref 10–40)
AST: 26 U/L (ref 14–40)
Alkaline Phosphatase: 98 U/L (ref 25–125)
BILIRUBIN, TOTAL: 0.2 mg/dL

## 2016-01-25 LAB — TSH: TSH: 1.27 u[IU]/mL (ref 0.41–5.90)

## 2016-01-26 DIAGNOSIS — E1151 Type 2 diabetes mellitus with diabetic peripheral angiopathy without gangrene: Secondary | ICD-10-CM | POA: Diagnosis not present

## 2016-01-26 DIAGNOSIS — I10 Essential (primary) hypertension: Secondary | ICD-10-CM | POA: Diagnosis not present

## 2016-01-26 LAB — LIPID PANEL
CHOLESTEROL: 136 mg/dL (ref 0–200)
HDL: 62 mg/dL (ref 35–70)
LDL Cholesterol: 62 mg/dL
Triglycerides: 61 mg/dL (ref 40–160)

## 2016-01-26 LAB — TSH: TSH: 1.23 u[IU]/mL (ref 0.41–5.90)

## 2016-01-27 ENCOUNTER — Encounter: Payer: Self-pay | Admitting: Internal Medicine

## 2016-01-27 ENCOUNTER — Non-Acute Institutional Stay (SKILLED_NURSING_FACILITY): Payer: Medicare Other | Admitting: Internal Medicine

## 2016-01-27 DIAGNOSIS — D508 Other iron deficiency anemias: Secondary | ICD-10-CM

## 2016-01-27 DIAGNOSIS — E2740 Unspecified adrenocortical insufficiency: Secondary | ICD-10-CM | POA: Diagnosis not present

## 2016-01-27 DIAGNOSIS — E119 Type 2 diabetes mellitus without complications: Secondary | ICD-10-CM

## 2016-01-27 LAB — CBC AND DIFFERENTIAL
HCT: 35 % — AB (ref 41–53)
Hemoglobin: 10.7 g/dL — AB (ref 13.5–17.5)
Platelets: 373 10*3/uL (ref 150–399)
WBC: 6.8 10^3/mL

## 2016-01-27 LAB — HEMOGLOBIN A1C: HEMOGLOBIN A1C: 5

## 2016-01-27 NOTE — Progress Notes (Signed)
Location:  [facility] Room Number: 214D Place of Service:  SNF [phone])  [name]. [name], MD  Patient Care Team: No Pcp Per Patient as PCP - General (General Practice)  Extended Emergency Contact Information Primary Emergency Contact: Reen,[name] Address: [address]          [place], [place] [address] [name] Phone: [phone] Mobile Phone: [phone] Relation: Mother Secondary Emergency Contact: Constance Haw [place] Phone: [phone] Relation: Brother Father: [place], [place] Mobile Phone: [phone]    Allergies: Aspirin; Keflex [cephalexin]; Nsaids; and Penicillins  Chief Complaint  Patient presents with  . Medical Management of Chronic Issues    Routine Visit    HPI: Patient is 60 y.o. male who is being seen for routine issues of adrenal insufficiency, DM2 and iron def anemia.  Past Medical History:  Diagnosis Date  . Adrenal insufficiency ([place])   . Anemia   . Aphasia 06/19/2013  . Depression   . Dysphagia 06/19/2013  . Parkinson's disease ([place])   . Quadriplegia ([place])   . Seizures ([place])     Past Surgical History:  Procedure Laterality Date  . PEG TUBE PLACEMENT    . PERIPHERALLY INSERTED CENTRAL CATHETER INSERTION    . TONSILLECTOMY  1962      Medication List       Accurate as of 01/27/16 11:59 PM. Always use your most recent med list.          acetaminophen 325 MG tablet Commonly known as:  TYLENOL Place 650 mg into feeding tube every 6 (six) hours as needed. Fever > 101   baclofen 10 MG tablet Commonly known as:  LIORESAL Place 1 tablet (10 mg total) into feeding tube 3 (three) times daily.   bisacodyl 10 MG suppository Commonly known as:  DULCOLAX Place 10 mg rectally daily as needed for moderate constipation. If constipation not relieved by milk of magnesia give one 10 mg suppository in 24hours   CENTAMIN Liqd 5 mLs by PEG  Tube route daily.   CETAPHIL GENTLE CLEANSER Liqd Apply to face and neck with water and then pat dry daily.   ferrous sulfate 220 (44 Fe) MG/5ML solution Place 330 mg into feeding tube See admin instructions. Give 7.64ml  Per tube every morning   folic acid 1 MG tablet Commonly known as:  FOLVITE 1 mg by PEG Tube route daily.   loratadine 10 MG tablet Commonly known as:  CLARITIN 10 mg by PEG Tube route daily.   magnesium hydroxide 400 MG/5ML suspension Commonly known as:  MILK OF MAGNESIA Take 30 mLs by mouth daily as needed for mild constipation.   metoCLOPramide 10 MG tablet Commonly known as:  REGLAN Place 1 tablet (10 mg total) into feeding tube 4 (four) times daily -  before meals and at bedtime. Via Peg tube   NUTRITIONAL SUPPLEMENTS PO Give Two Cal HN at 55cc/hr x 20 hrs via peg ( HOLD 1 HOUR PRE/POST PHENTON ADMINISTRATION)   olopatadine 0.1 % ophthalmic solution Commonly known as:  PATANOL Place 1 drop into both eyes every morning.   pantoprazole 40 MG tablet Commonly known as:  PROTONIX Give 40 mg by tube 2 (two) times daily. Dilute as instructed   phenytoin 125 MG/5ML suspension Commonly known as:  DILANTIN Place 4 mLs (100 mg total) into feeding tube 2 (two) times daily.   polyvinyl alcohol 1.4 % ophthalmic solution Commonly known as:  LIQUIFILM TEARS Place 1 drop into both eyes daily at 6 (six) AM.   prednisoLONE 15 MG/5ML Soln Commonly known as:  PRELONE Place into feeding tube. Give 6 mg every morning and 4 mg every evening at 6 pm   RA SALINE ENEMA 19-7 GM/118ML Enem Place 1 each rectally as needed (for constipation).   sterile water for irrigation Flush 244ml of water into peg tub every 4 hours       No orders of the defined types were placed in this encounter.   Immunization History  Administered Date(s) Administered  . Influenza Whole 12/31/2012  . Influenza-Unspecified 12/29/2011, 12/31/2012, 01/07/2014, 12/22/2014, 01/22/2016  . PPD  Test 08/22/2008  . Pneumococcal-Unspecified 06/23/2010, 06/23/2015    Social History  Substance Use Topics  . Smoking status: Never Smoker  . Smokeless tobacco: Never Used  . Alcohol use No    Review of Systems  DATA OBTAINED: from patient- can not fully participate; nurse GENERAL:  no fevers, fatigue, appetite changes SKIN: No itching, rash HEENT: No complaint RESPIRATORY: No cough, wheezing, SOB CARDIAC: No chest pain, palpitations, lower extremity edema  GI: No abdominal pain, No N/V/D or constipation, No heartburn or reflux  GU: No dysuria, frequency or urgency, or incontinence  MUSCULOSKELETAL: No unrelieved bone/joint pain NEUROLOGIC: No headache, dizziness  PSYCHIATRIC: No overt anxiety or sadness  Vitals:   01/27/16 0842  BP: 118/73  Pulse: 77  Resp: 18  Temp: 97.1 F (36.2 C)   Body mass index is 21.53 kg/m. Physical Exam  GENERAL APPEARANCE: Alert, non conversant, No acute distress  SKIN: No diaphoresis rash HEENT: Unremarkable RESPIRATORY: Breathing is even, unlabored. Lung sounds are clear   CARDIOVASCULAR: Heart RRR no murmurs, rubs or gallops. No peripheral edema  GASTROINTESTINAL: Abdomen is soft, non-tender, not distended w/ normal bowel sounds; PEG.  GENITOURINARY: Bladder non tender, not distended  MUSCULOSKELETAL: wasting and contractures NEUROLOGIC: Cranial nerves 2-12 grossly intact; quadriplegia, can move UE some PSYCHIATRIC: Mood and affect appropriate to situation, no behavioral issues  Patient Active Problem List   Diagnosis Date Noted  . Itchy eyes 11/12/2015  . GERD (gastroesophageal reflux disease) 03/17/2015  . Tinea cruris 09/10/2014  . Aspiration pneumonia ([place]) 08/15/2014  . Abscess, prostate 08/15/2014  . Pyelonephritis 08/06/2014  . Nausea & vomiting   . Fever presenting with conditions classified elsewhere 03/16/2014  . Altered mental status 03/16/2014  . Acute bronchiolitis due to unspecified organism 03/11/2014  .  Enteritis due to Clostridium difficile 03/11/2014  . Type 2 diabetes mellitus without complication ([place]) [id number]  . Septic shock ([place]) 01/26/2014  . Sepsis ([place]) 01/25/2014  . Acute encephalopathy 01/02/2014  . Acute respiratory failure with hypercapnia ([place]) 01/02/2014  . Chronic anemia 01/02/2014  . Subdural hematoma ([place]) 01/02/2014  . Pain in thoracic spine 12/26/2013  . PEG (percutaneous endoscopic gastrostomy) status ([place]) 10/04/2013  . Rash and nonspecific skin eruption 07/08/2013  . Thrush 07/08/2013  . Heme positive stool 07/07/2013  . Severe sepsis ([place]) 06/23/2013  . E. coli pyelonephritis 06/22/2013  . SIRS (systemic inflammatory response syndrome) ([place]) 06/19/2013  . Leukocytosis 06/19/2013  . Bronchitis 06/19/2013  . Dysphagia 06/19/2013  . Aphasia 06/19/2013  . Other convulsions 08/10/2012  . Iron deficiency anemia 08/10/2012  . Glucocorticoid deficiency ([place]) 08/10/2012  . Febrile illness, acute 06/15/2012  . UTI (lower urinary tract infection) 06/15/2012  . Hypokalemia 11/10/2011  . Diabetic gastroparesis ([place]) 11/08/2011  . Ileus ([place]) 11/08/2011  . Hyponatremia 11/08/2011  . Adrenal insufficiency ([place]) 11/08/2011  .  Quadriplegia ([place]) 11/08/2011  . Parkinson's disease ([place])   . Seizures ([place])   . Depression     CMP     Component Value Date/Time   NA 134 (A) 01/25/2016   K 4.4 01/25/2016   CL 99 (L) 01/11/2016 0353   CO2 25 01/11/2016 0353   GLUCOSE 90 01/11/2016 0353   BUN 18 01/25/2016   CREATININE 0.5 (A) 01/25/2016   CREATININE 0.52 (L) 01/11/2016 0353   CALCIUM 8.3 (L) 01/11/2016 0353   PROT 7.7 01/11/2016 0353   ALBUMIN 3.3 (L) 01/11/2016 0353   AST 26 01/25/2016   ALT 17 01/25/2016   ALKPHOS 98 01/25/2016   BILITOT 0.3 01/11/2016 0353   GFRNONAA >60 01/11/2016 0353   GFRAA >60 01/11/2016 0353    Recent Labs  08/10/15 01/11/16 0353 01/25/16  NA 136* 131* 134*  K 4.1 3.7 4.4  CL  --  99*  --   CO2  --  25  --   GLUCOSE  --  90   --   BUN 16 17 18   CREATININE 0.6 0.52* 0.5*  CALCIUM  --  8.3*  --     Recent Labs  08/10/15 01/11/16 0353 01/25/16  AST 23 37 26  ALT 16 27 17   ALKPHOS 141* 114 98  BILITOT  --  0.3  --   PROT  --  7.7  --   ALBUMIN  --  3.3*  --     Recent Labs  08/10/15 01/11/16 0353 01/27/16  WBC 6.2 8.7 6.8  NEUTROABS  --  5.8  --   HGB 10.5* 11.0* 10.7*  HCT 33* 32.9* 35*  MCV  --  69.6*  --   PLT 239 192 373    Recent Labs  08/10/15 01/25/16 01/26/16  CHOL 157 138 136  LDLCALC 76 62 62  TRIG 75 60 61   Lab Results  Component Value Date   MICROALBUR 29.1 10/09/2015   Lab Results  Component Value Date   TSH 1.23 01/26/2016   Lab Results  Component Value Date   HGBA1C 5.0 01/27/2016   Lab Results  Component Value Date   CHOL 136 01/26/2016   HDL 62 01/26/2016   LDLCALC 62 01/26/2016   TRIG 61 01/26/2016    Significant Diagnostic Results in last 30 days:  Dg Chest Port 1 View  Result Date: 01/11/2016 CLINICAL DATA:  60 year old male with fever EXAM: PORTABLE CHEST 1 VIEW COMPARISON:  Chest radiograph dated 08/08/2014 FINDINGS: Mild emphysematous changes of the lungs with interstitial coarsening. There is mild elevation of the right hemidiaphragm. No focal consolidation, pleural effusion, or pneumothorax. The cardiac silhouette is within normal limits. No acute osseous pathology. IMPRESSION: No active disease. Electronically Signed   By: Anner Crete M.D.   On: 01/11/2016 04:19    Assessment and Plan  Adrenal insufficiency Stable, has been in no stress situations;until then plan to cont prelone syrup 6 mg qam and 4 mg qHS  Type 2 diabetes mellitus without complication AIC 5.0, EXCELLENT CONTROLon no meds just tube feeds; plan to cont curent tube feeds and monitor at intervals  Iron deficiency anemia Recent Hb is 10.7 - stable; MCV is still low at 69; plan to cont daily iron     Webb Silversmith D. [name], MD

## 2016-01-31 ENCOUNTER — Encounter: Payer: Self-pay | Admitting: Internal Medicine

## 2016-01-31 NOTE — Assessment & Plan Note (Signed)
AIC 5.0, EXCELLENT CONTROLon no meds just tube feeds; plan to cont curent tube feeds and monitor at intervals

## 2016-01-31 NOTE — Assessment & Plan Note (Signed)
Stable, has been in no stress situations;until then plan to cont prelone syrup 6 mg qam and 4 mg qHS

## 2016-01-31 NOTE — Assessment & Plan Note (Signed)
Recent Hb is 10.7 - stable; MCV is still low at 69; plan to cont daily iron

## 2016-02-11 DIAGNOSIS — B351 Tinea unguium: Secondary | ICD-10-CM | POA: Diagnosis not present

## 2016-02-11 DIAGNOSIS — I70203 Unspecified atherosclerosis of native arteries of extremities, bilateral legs: Secondary | ICD-10-CM | POA: Diagnosis not present

## 2016-02-11 DIAGNOSIS — G8253 Quadriplegia, C5-C7 complete: Secondary | ICD-10-CM | POA: Diagnosis not present

## 2016-02-26 ENCOUNTER — Non-Acute Institutional Stay (SKILLED_NURSING_FACILITY): Payer: Medicare Other | Admitting: Internal Medicine

## 2016-02-26 ENCOUNTER — Encounter: Payer: Self-pay | Admitting: Internal Medicine

## 2016-02-26 DIAGNOSIS — E1143 Type 2 diabetes mellitus with diabetic autonomic (poly)neuropathy: Secondary | ICD-10-CM

## 2016-02-26 DIAGNOSIS — R1310 Dysphagia, unspecified: Secondary | ICD-10-CM

## 2016-02-26 DIAGNOSIS — K3184 Gastroparesis: Secondary | ICD-10-CM

## 2016-02-26 DIAGNOSIS — K219 Gastro-esophageal reflux disease without esophagitis: Secondary | ICD-10-CM

## 2016-02-26 NOTE — Progress Notes (Signed)
Location:  [facility] Room Number: [phone] Place of Service:  SNF [phone])  [name]. [name], MD  Patient Care Team: No Pcp Per Patient as PCP - General (General Practice)  Extended Emergency Contact Information Primary Emergency Contact: Krolak,[name] Address: [address]          [place], [place] [address] [name] Phone: [phone] Mobile Phone: [phone] Relation: Mother Secondary Emergency Contact: Constance Haw [place] Phone: [phone] Relation: Brother Father: Jeziel, [place] Mobile Phone: [phone]    Allergies: Aspirin; Keflex [cephalexin]; Nsaids; and Penicillins  Chief Complaint  Patient presents with  . Medical Management of Chronic Issues    Routine Visit    HPI: Patient is 60 y.o. male who is being seen for routine issues of dysphagia, diabetic gastroparesis, and GERD.  Past Medical History:  Diagnosis Date  . Adrenal insufficiency ([place])   . Anemia   . Aphasia 06/19/2013  . Depression   . Dysphagia 06/19/2013  . Parkinson's disease ([place])   . Quadriplegia ([place])   . Seizures ([place])     Past Surgical History:  Procedure Laterality Date  . PEG TUBE PLACEMENT    . PERIPHERALLY INSERTED CENTRAL CATHETER INSERTION    . TONSILLECTOMY  1962      Medication List       Accurate as of 02/26/16 11:59 PM. Always use your most recent med list.          acetaminophen 325 MG tablet Commonly known as:  TYLENOL Place 650 mg into feeding tube every 6 (six) hours as needed. Fever > 101   baclofen 10 MG tablet Commonly known as:  LIORESAL Place 1 tablet (10 mg total) into feeding tube 3 (three) times daily.   bisacodyl 10 MG suppository Commonly known as:  DULCOLAX Place 10 mg rectally daily as needed for moderate constipation. If constipation not relieved by milk of magnesia give one 10 mg suppository in 24hours   CENTAMIN Liqd 5 mLs by PEG  Tube route daily.   CETAPHIL GENTLE CLEANSER Liqd Apply to face and neck with water and then pat dry daily.   ferrous sulfate 220 (44 Fe) MG/5ML solution Place 330 mg into feeding tube See admin instructions. Give 7.55ml  Per tube every morning   folic acid 1 MG tablet Commonly known as:  FOLVITE 1 mg by PEG Tube route daily.   loratadine 10 MG tablet Commonly known as:  CLARITIN 10 mg by PEG Tube route daily.   magnesium hydroxide 400 MG/5ML suspension Commonly known as:  MILK OF MAGNESIA Take 30 mLs by mouth daily as needed for mild constipation.   metoCLOPramide 10 MG tablet Commonly known as:  REGLAN Place 1 tablet (10 mg total) into feeding tube 4 (four) times daily -  before meals and at bedtime. Via Peg tube   NUTRITIONAL SUPPLEMENTS PO Give Two Cal HN at 55cc/hr x 20 hrs via peg ( HOLD 1 HOUR PRE/POST PHENTON ADMINISTRATION)   olopatadine 0.1 % ophthalmic solution Commonly known as:  PATANOL Place 1 drop into both eyes every morning.   pantoprazole 40 MG tablet Commonly known as:  PROTONIX Give 40 mg by tube 2 (two) times daily. Dilute as instructed   phenytoin 125 MG/5ML suspension Commonly known as:  DILANTIN Place 4 mLs (100 mg total) into feeding tube 2 (two) times daily.   polyvinyl alcohol 1.4 % ophthalmic solution Commonly known as:  LIQUIFILM  TEARS Place 1 drop into both eyes daily at 6 (six) AM.   prednisoLONE 15 MG/5ML Soln Commonly known as:  PRELONE Place into feeding tube. Give 6 mg every morning and 4 mg every evening at 6 pm   RA SALINE ENEMA 19-7 GM/118ML Enem Place 1 each rectally as needed (for constipation).   sterile water for irrigation Flush 274ml of water into peg tub every 4 hours       No orders of the defined types were placed in this encounter.   Immunization History  Administered Date(s) Administered  . Influenza Whole 12/31/2012  . Influenza-Unspecified 12/29/2011, 12/31/2012, 01/07/2014, 12/22/2014, 01/22/2016  . PPD  Test 08/22/2008  . Pneumococcal-Unspecified 06/23/2010, 06/23/2015    Social History  Substance Use Topics  . Smoking status: Never Smoker  . Smokeless tobacco: Never Used  . Alcohol use No    Review of Systems  DATA OBTAINED: from patient GENERAL:  no fevers, fatigue, appetite changes SKIN: No itching, rash HEENT: No complaint RESPIRATORY: No cough, wheezing, SOB CARDIAC: No chest pain, palpitations, lower extremity edema  GI: No abdominal pain, No N/V/D or constipation, No heartburn or reflux  GU: No dysuria, frequency or urgency, or incontinence  MUSCULOSKELETAL: No unrelieved bone/joint pain NEUROLOGIC: No headache, dizziness  PSYCHIATRIC: No overt anxiety or sadness  Vitals:   02/26/16 1104  BP: 118/73  Pulse: 74  Resp: 18  Temp: 97.1 F (36.2 C)   Body mass index is 21.32 kg/m. Physical Exam  GENERAL APPEARANCE: Alert,  No acute distress  SKIN: No diaphoresis rash HEENT: Unremarkable RESPIRATORY: Breathing is even, unlabored. Lung sounds are clear   CARDIOVASCULAR: Heart RRR no murmurs, rubs or gallops. No peripheral edema  GASTROINTESTINAL: Abdomen is soft, non-tender, not distended w/ normal bowel sounds.  GENITOURINARY: Bladder non tender, not distended  MUSCULOSKELETAL:wasting and contractures NEUROLOGIC: Cranial nerves 2-12 grossly intact;quadriplegia with some movement of BUE PSYCHIATRIC: Mood and affect appropriate to situation, no behavioral issues  Patient Active Problem List   Diagnosis Date Noted  . Itchy eyes 11/12/2015  . GERD (gastroesophageal reflux disease) 03/17/2015  . Tinea cruris 09/10/2014  . Aspiration pneumonia ([place]) 08/15/2014  . Abscess, prostate 08/15/2014  . Pyelonephritis 08/06/2014  . Nausea & vomiting   . Fever presenting with conditions classified elsewhere 03/16/2014  . Altered mental status 03/16/2014  . Acute bronchiolitis due to unspecified organism 03/11/2014  . Enteritis due to Clostridium difficile 03/11/2014  .  Type 2 diabetes mellitus without complication ([place]) [id number]  . Septic shock ([place]) 01/26/2014  . Sepsis ([place]) 01/25/2014  . Acute encephalopathy 01/02/2014  . Acute respiratory failure with hypercapnia ([place]) 01/02/2014  . Chronic anemia 01/02/2014  . Subdural hematoma ([place]) 01/02/2014  . Pain in thoracic spine 12/26/2013  . PEG (percutaneous endoscopic gastrostomy) status ([place]) 10/04/2013  . Rash and nonspecific skin eruption 07/08/2013  . Thrush 07/08/2013  . Heme positive stool 07/07/2013  . Severe sepsis ([place]) 06/23/2013  . E. coli pyelonephritis 06/22/2013  . SIRS (systemic inflammatory response syndrome) ([place]) 06/19/2013  . Leukocytosis 06/19/2013  . Bronchitis 06/19/2013  . Dysphagia 06/19/2013  . Aphasia 06/19/2013  . Other convulsions 08/10/2012  . Iron deficiency anemia 08/10/2012  . Glucocorticoid deficiency ([place]) 08/10/2012  . Febrile illness, acute 06/15/2012  . UTI (lower urinary tract infection) 06/15/2012  . Hypokalemia 11/10/2011  . Diabetic gastroparesis ([place]) 11/08/2011  . Ileus ([place]) 11/08/2011  . Hyponatremia 11/08/2011  . Adrenal insufficiency ([place]) 11/08/2011  . Quadriplegia ([place]) 11/08/2011  . Parkinson's disease ([place])   .  Seizures ([place])   . Depression     CMP     Component Value Date/Time   NA 134 (A) 01/25/2016   K 4.4 01/25/2016   CL 99 (L) 01/11/2016 0353   CO2 25 01/11/2016 0353   GLUCOSE 90 01/11/2016 0353   BUN 18 01/25/2016   CREATININE 0.5 (A) 01/25/2016   CREATININE 0.52 (L) 01/11/2016 0353   CALCIUM 8.3 (L) 01/11/2016 0353   PROT 7.7 01/11/2016 0353   ALBUMIN 3.3 (L) 01/11/2016 0353   AST 26 01/25/2016   ALT 17 01/25/2016   ALKPHOS 98 01/25/2016   BILITOT 0.3 01/11/2016 0353   GFRNONAA >60 01/11/2016 0353   GFRAA >60 01/11/2016 0353    Recent Labs  08/10/15 01/11/16 0353 01/25/16  NA 136* 131* 134*  K 4.1 3.7 4.4  CL  --  99*  --   CO2  --  25  --   GLUCOSE  --  90  --   BUN 16 17 18   CREATININE 0.6 0.52* 0.5*    CALCIUM  --  8.3*  --     Recent Labs  08/10/15 01/11/16 0353 01/25/16  AST 23 37 26  ALT 16 27 17   ALKPHOS 141* 114 98  BILITOT  --  0.3  --   PROT  --  7.7  --   ALBUMIN  --  3.3*  --     Recent Labs  08/10/15 01/11/16 0353 01/27/16  WBC 6.2 8.7 6.8  NEUTROABS  --  5.8  --   HGB 10.5* 11.0* 10.7*  HCT 33* 32.9* 35*  MCV  --  69.6*  --   PLT 239 192 373    Recent Labs  08/10/15 01/25/16 01/26/16  CHOL 157 138 136  LDLCALC 76 62 62  TRIG 75 60 61   Lab Results  Component Value Date   MICROALBUR 29.1 10/09/2015   Lab Results  Component Value Date   TSH 1.23 01/26/2016   Lab Results  Component Value Date   HGBA1C 5.0 01/27/2016   Lab Results  Component Value Date   CHOL 136 01/26/2016   HDL 62 01/26/2016   LDLCALC 62 01/26/2016   TRIG 61 01/26/2016    Significant Diagnostic Results in last 30 days:  No results found.  Assessment and Plan  Dysphagia Dysphagia from intracranial bleed; permanent; will monitor function of PEG tube  Diabetic gastroparesis No reports od aspiration or reflux;plan to cont reglan QID per tube  GERD (gastroesophageal reflux disease) No reflux or aspiration; cont protonix 40 mg BID     [name]. [name], MD

## 2016-03-06 ENCOUNTER — Encounter: Payer: Self-pay | Admitting: Internal Medicine

## 2016-03-06 NOTE — Assessment & Plan Note (Signed)
No reflux or aspiration; cont protonix 40 mg BID

## 2016-03-06 NOTE — Assessment & Plan Note (Signed)
No reports od aspiration or reflux;plan to cont reglan QID per tube

## 2016-03-06 NOTE — Assessment & Plan Note (Signed)
Dysphagia from intracranial bleed; permanent; will monitor function of PEG tube

## 2016-03-25 ENCOUNTER — Non-Acute Institutional Stay (SKILLED_NURSING_FACILITY): Payer: Medicare Other | Admitting: Internal Medicine

## 2016-03-25 DIAGNOSIS — R112 Nausea with vomiting, unspecified: Secondary | ICD-10-CM

## 2016-03-26 DIAGNOSIS — R05 Cough: Secondary | ICD-10-CM | POA: Diagnosis not present

## 2016-03-26 DIAGNOSIS — R112 Nausea with vomiting, unspecified: Secondary | ICD-10-CM | POA: Diagnosis not present

## 2016-03-26 LAB — CBC AND DIFFERENTIAL
HCT: 39 % — AB (ref 41–53)
Hemoglobin: 12.1 g/dL — AB (ref 13.5–17.5)
PLATELETS: 247 10*3/uL (ref 150–399)
WBC: 6.6 10*3/mL

## 2016-03-26 LAB — HEPATIC FUNCTION PANEL
ALT: 18 U/L (ref 10–40)
AST: 28 U/L (ref 14–40)
Alkaline Phosphatase: 129 U/L — AB (ref 25–125)
Bilirubin, Total: 0.2 mg/dL

## 2016-03-26 LAB — BASIC METABOLIC PANEL
BUN: 23 mg/dL — AB (ref 4–21)
CREATININE: 0.5 mg/dL — AB (ref 0.6–1.3)
GLUCOSE: 148 mg/dL
POTASSIUM: 4 mmol/L (ref 3.4–5.3)
Sodium: 135 mmol/L — AB (ref 137–147)

## 2016-03-29 ENCOUNTER — Encounter: Payer: Self-pay | Admitting: Internal Medicine

## 2016-03-29 ENCOUNTER — Non-Acute Institutional Stay (SKILLED_NURSING_FACILITY): Payer: Medicare Other | Admitting: Internal Medicine

## 2016-03-29 DIAGNOSIS — R569 Unspecified convulsions: Secondary | ICD-10-CM | POA: Diagnosis not present

## 2016-03-29 DIAGNOSIS — D649 Anemia, unspecified: Secondary | ICD-10-CM

## 2016-03-29 DIAGNOSIS — G8250 Quadriplegia, unspecified: Secondary | ICD-10-CM

## 2016-03-29 NOTE — Progress Notes (Signed)
Location:  [facility] Room Number: [phone] Place of Service:  SNF [phone])  [name], MD  Patient Care Team: No Pcp Per Patient as PCP - General (General Practice)  Extended Emergency Contact Information Primary Emergency Contact: Pascucci,[name] Address: [address]          [place], [place] [address] [name] Phone: [phone] Mobile Phone: [phone] Relation: Mother Secondary Emergency Contact: Constance Haw [place] Phone: [phone] Relation: Brother Father: Ryun, Rodenbaugh [place] Mobile Phone: [phone]    Allergies: Aspirin; Keflex [cephalexin]; Nsaids; and Penicillins  Chief Complaint  Patient presents with  . Medical Management of Chronic Issues    Routine Visit    HPI: Patient is 61 y.o. male who is being seen for routine issues of chronic anemia, quadriplegia and seizures.  Past Medical History:  Diagnosis Date  . Adrenal insufficiency ([place])   . Anemia   . Aphasia 06/19/2013  . Depression   . Dysphagia 06/19/2013  . Parkinson's disease ([place])   . Quadriplegia ([place])   . Seizures ([place])     Past Surgical History:  Procedure Laterality Date  . PEG TUBE PLACEMENT    . PERIPHERALLY INSERTED CENTRAL CATHETER INSERTION    . TONSILLECTOMY  1962    Allergies as of 03/29/2016      Reactions   Aspirin Other (See Comments)   Unknown reaction; "blood doesn't clog too well" per mother.    Keflex [cephalexin]    Rash   Nsaids Other (See Comments)   Unknown reaction   Penicillins Hives, Other (See Comments)   Unknown reaction.  Pt has tolerated cephalosporins in the past.      Medication List       Accurate as of 03/29/16 11:59 PM. Always use your most recent med list.          acetaminophen 325 MG tablet Commonly known as:  TYLENOL Place 650 mg into feeding tube every 6 (six) hours as needed. Fever > 101   baclofen 10 MG tablet Commonly known  as:  LIORESAL Place 1 tablet (10 mg total) into feeding tube 3 (three) times daily.   bisacodyl 10 MG suppository Commonly known as:  DULCOLAX Place 10 mg rectally daily as needed for moderate constipation. If constipation not relieved by milk of magnesia give one 10 mg suppository in 24hours   CENTAMIN Liqd 5 mLs by PEG Tube route daily.   CETAPHIL GENTLE CLEANSER Liqd Apply to face and neck with water and then pat dry daily.   ferrous sulfate 220 (44 Fe) MG/5ML solution Place 330 mg into feeding tube See admin instructions. Give 7.77ml  Per tube every morning   folic acid 1 MG tablet Commonly known as:  FOLVITE 1 mg by PEG Tube route daily.   loratadine 10 MG tablet Commonly known as:  CLARITIN 10 mg by PEG Tube route daily.   magnesium hydroxide 400 MG/5ML suspension Commonly known as:  MILK OF MAGNESIA Take 30 mLs by mouth daily as needed for mild constipation.   metoCLOPramide 10 MG tablet Commonly known as:  REGLAN Place 1 tablet (10 mg total) into feeding tube 4 (four) times daily -  before meals and at bedtime. Via Peg tube   NUTRITIONAL SUPPLEMENTS PO Give Two Cal HN at 55cc/hr x 20 hrs via peg ( HOLD 1 HOUR PRE/POST PHENTON ADMINISTRATION)   olopatadine 0.1 % ophthalmic solution Commonly known as:  PATANOL  Place 1 drop into both eyes every morning.   pantoprazole 40 MG tablet Commonly known as:  PROTONIX Give 40 mg by tube 2 (two) times daily. Dilute as instructed   phenytoin 125 MG/5ML suspension Commonly known as:  DILANTIN Place 4 mLs (100 mg total) into feeding tube 2 (two) times daily.   polyvinyl alcohol 1.4 % ophthalmic solution Commonly known as:  LIQUIFILM TEARS Place 1 drop into both eyes daily at 6 (six) AM.   prednisoLONE 15 MG/5ML Soln Commonly known as:  PRELONE Place into feeding tube. Give 6 mg every morning and 4 mg every evening at 6 pm   RA SALINE ENEMA 19-7 GM/118ML Enem Place 1 each rectally as needed (for constipation).     sterile water for irrigation Flush 281ml of water into peg tub every 4 hours       No orders of the defined types were placed in this encounter.   Immunization History  Administered Date(s) Administered  . Influenza Whole 12/31/2012  . Influenza-Unspecified 12/29/2011, 12/31/2012, 01/07/2014, 12/22/2014, 01/22/2016  . PPD Test 08/22/2008  . Pneumococcal-Unspecified 06/23/2010, 06/23/2015    Social History  Substance Use Topics  . Smoking status: Never Smoker  . Smokeless tobacco: Never Used  . Alcohol use No    Review of Systems  DATA OBTAINED: from patient, nurse GENERAL:  no fevers, fatigue, appetite changes SKIN: No itching, rash HEENT: No complaint RESPIRATORY: No cough, wheezing, SOB CARDIAC: No chest pain, palpitations, lower extremity edema  GI: No abdominal pain, No N/V/D or constipation, No heartburn or reflux  GU: No dysuria, frequency or urgency, or incontinence  MUSCULOSKELETAL: No unrelieved bone/joint pain NEUROLOGIC: No headache, dizziness  PSYCHIATRIC: No overt anxiety or sadness  Vitals:   03/29/16 0858  BP: 118/73  Pulse: 70  Resp: 17  Temp: 97.1 F (36.2 C)   Body mass index is 22.08 kg/m. Physical Exam  GENERAL APPEARANCE: Alert, non conversant, No acute distress  SKIN: No diaphoresis rash HEENT: Unremarkable RESPIRATORY: Breathing is even, unlabored. Lung sounds are clear   CARDIOVASCULAR: Heart RRR no murmurs, rubs or gallops. No peripheral edema  GASTROINTESTINAL: Abdomen is soft, non-tender, not distended w/ normal bowel sounds.  GENITOURINARY: Bladder non tender, not distended  MUSCULOSKELETAL: No abnormal joints or musculature NEUROLOGIC: Cranial nerves 2-12 grossly intact; functional quadriplegia PSYCHIATRIC: Mood and affect appropriate to situation, no behavioral issues  Patient Active Problem List   Diagnosis Date Noted  . Itchy eyes 11/12/2015  . GERD (gastroesophageal reflux disease) 03/17/2015  . Tinea cruris  09/10/2014  . Aspiration pneumonia ([place]) 08/15/2014  . Abscess, prostate 08/15/2014  . Pyelonephritis 08/06/2014  . Nausea & vomiting   . Fever presenting with conditions classified elsewhere 03/16/2014  . Altered mental status 03/16/2014  . Acute bronchiolitis due to unspecified organism 03/11/2014  . Enteritis due to Clostridium difficile 03/11/2014  . Type 2 diabetes mellitus without complication ([place]) [id number]  . Septic shock ([place]) 01/26/2014  . Sepsis ([place]) 01/25/2014  . Acute encephalopathy 01/02/2014  . Acute respiratory failure with hypercapnia ([place]) 01/02/2014  . Chronic anemia 01/02/2014  . Subdural hematoma ([place]) 01/02/2014  . Pain in thoracic spine 12/26/2013  . PEG (percutaneous endoscopic gastrostomy) status ([place]) 10/04/2013  . Rash and nonspecific skin eruption 07/08/2013  . Thrush 07/08/2013  . Heme positive stool 07/07/2013  . Severe sepsis ([place]) 06/23/2013  . E. coli pyelonephritis 06/22/2013  . SIRS (systemic inflammatory response syndrome) ([place]) 06/19/2013  . Leukocytosis 06/19/2013  . Bronchitis 06/19/2013  . Dysphagia  06/19/2013  . Aphasia 06/19/2013  . Other convulsions 08/10/2012  . Iron deficiency anemia 08/10/2012  . Glucocorticoid deficiency ([place]) 08/10/2012  . Febrile illness, acute 06/15/2012  . UTI (lower urinary tract infection) 06/15/2012  . Hypokalemia 11/10/2011  . Diabetic gastroparesis ([place]) 11/08/2011  . Ileus ([place]) 11/08/2011  . Hyponatremia 11/08/2011  . Adrenal insufficiency ([place]) 11/08/2011  . Quadriplegia ([place]) 11/08/2011  . Parkinson's disease ([place])   . Seizures ([place])   . Depression     CMP     Component Value Date/Time   NA 134 (A) 01/25/2016   K 4.4 01/25/2016   CL 99 (L) 01/11/2016 0353   CO2 25 01/11/2016 0353   GLUCOSE 90 01/11/2016 0353   BUN 18 01/25/2016   CREATININE 0.5 (A) 01/25/2016   CREATININE 0.52 (L) 01/11/2016 0353   CALCIUM 8.3 (L) 01/11/2016 0353   PROT 7.7 01/11/2016 0353   ALBUMIN 3.3 (L)  01/11/2016 0353   AST 26 01/25/2016   ALT 17 01/25/2016   ALKPHOS 98 01/25/2016   BILITOT 0.3 01/11/2016 0353   GFRNONAA >60 01/11/2016 0353   GFRAA >60 01/11/2016 0353    Recent Labs  08/10/15 01/11/16 0353 01/25/16  NA 136* 131* 134*  K 4.1 3.7 4.4  CL  --  99*  --   CO2  --  25  --   GLUCOSE  --  90  --   BUN 16 17 18   CREATININE 0.6 0.52* 0.5*  CALCIUM  --  8.3*  --     Recent Labs  08/10/15 01/11/16 0353 01/25/16  AST 23 37 26  ALT 16 27 17   ALKPHOS 141* 114 98  BILITOT  --  0.3  --   PROT  --  7.7  --   ALBUMIN  --  3.3*  --     Recent Labs  08/10/15 01/11/16 0353 01/27/16  WBC 6.2 8.7 6.8  NEUTROABS  --  5.8  --   HGB 10.5* 11.0* 10.7*  HCT 33* 32.9* 35*  MCV  --  69.6*  --   PLT 239 192 373    Recent Labs  08/10/15 01/25/16 01/26/16  CHOL 157 138 136  LDLCALC 76 62 62  TRIG 75 60 61   Lab Results  Component Value Date   MICROALBUR 29.1 10/09/2015   Lab Results  Component Value Date   TSH 1.23 01/26/2016   Lab Results  Component Value Date   HGBA1C 5.0 01/27/2016   Lab Results  Component Value Date   CHOL 136 01/26/2016   HDL 62 01/26/2016   LDLCALC 62 01/26/2016   TRIG 61 01/26/2016    Significant Diagnostic Results in last 30 days:  No results found.  Assessment and Plan  Chronic anemia Most recent Hb is 10.7, stable from prior; plan to cont iron solution 330 mg per tiube daily  Quadriplegia Chronic and stable; pt has some movement of upper extremities; plan to continue baclofen 10 mg per tube TID   Seizures Continues no reported seizures;plan to cont dilantin solution 100 mg BID0    [name], MD

## 2016-04-01 ENCOUNTER — Encounter: Payer: Self-pay | Admitting: Internal Medicine

## 2016-04-01 NOTE — Progress Notes (Signed)
Location:  [facility] of Service:  SNF (31)  [name] MD  Patient Care Team: No Pcp Per Patient as PCP - General (General Practice)  Extended Emergency Contact Information Primary Emergency Contact: Hengel,[name] Address: [address]          [place], [place] [address] [name] Phone: [phone] Mobile Phone: [phone] Relation: Mother Secondary Emergency Contact: Constance Haw [place] Phone: [phone] Relation: Brother Father: Takeem, Kucher [place] Mobile Phone: [phone]    Allergies: Aspirin; Keflex [cephalexin]; Nsaids; and Penicillins  Chief Complaint  Patient presents with  . Acute Visit    HPI: Patient is 61 y.o. male whose mother asked me to see today. Per Mom pt has vomited several times today. Nursing was unaware of this-they are here with me. I know pt vomited once on Monday 5 days ago) and his tube feed was turned off for several hours ) and there were no recurrences. Pt has had no fever, no cold, cough , diarrhea, foul urine.Pt is a quad so he does get septic from time to time.  Past Medical History:  Diagnosis Date  . Adrenal insufficiency ([place])   . Anemia   . Aphasia 06/19/2013  . Depression   . Dysphagia 06/19/2013  . Parkinson's disease ([place])   . Quadriplegia ([place])   . Seizures ([place])     Past Surgical History:  Procedure Laterality Date  . PEG TUBE PLACEMENT    . PERIPHERALLY INSERTED CENTRAL CATHETER INSERTION    . TONSILLECTOMY  1962    Allergies as of 03/25/2016      Reactions   Aspirin Other (See Comments)   Unknown reaction; "blood doesn't clog too well" per mother.    Keflex [cephalexin]    Rash   Nsaids Other (See Comments)   Unknown reaction   Penicillins Hives, Other (See Comments)   Unknown reaction.  Pt has tolerated cephalosporins in the past.      Medication List       Accurate as of 03/25/16 11:59  PM. Always use your most recent med list.          acetaminophen 325 MG tablet Commonly known as:  TYLENOL Place 650 mg into feeding tube every 6 (six) hours as needed. Fever > 101   baclofen 10 MG tablet Commonly known as:  LIORESAL Place 1 tablet (10 mg total) into feeding tube 3 (three) times daily.   bisacodyl 10 MG suppository Commonly known as:  DULCOLAX Place 10 mg rectally daily as needed for moderate constipation. If constipation not relieved by milk of magnesia give one 10 mg suppository in 24hours   CENTAMIN Liqd 5 mLs by PEG Tube route daily.   CETAPHIL GENTLE CLEANSER Liqd Apply to face and neck with water and then pat dry daily.   ferrous sulfate 220 (44 Fe) MG/5ML solution Place 330 mg into feeding tube See admin instructions. Give 7.31ml  Per tube every morning   folic acid 1 MG tablet Commonly known as:  FOLVITE 1 mg by PEG Tube route daily.   loratadine 10 MG tablet Commonly known as:  CLARITIN 10 mg by PEG Tube route daily.   magnesium hydroxide 400 MG/5ML suspension Commonly known as:  MILK OF MAGNESIA Take 30 mLs by mouth daily as needed for mild constipation.   metoCLOPramide 10 MG tablet Commonly known as:  REGLAN Place 1 tablet (10 mg total) into  feeding tube 4 (four) times daily -  before meals and at bedtime. Via Peg tube   NUTRITIONAL SUPPLEMENTS PO Give Two Cal HN at 55cc/hr x 20 hrs via peg ( HOLD 1 HOUR PRE/POST PHENTON ADMINISTRATION)   olopatadine 0.1 % ophthalmic solution Commonly known as:  PATANOL Place 1 drop into both eyes every morning.   pantoprazole 40 MG tablet Commonly known as:  PROTONIX Give 40 mg by tube 2 (two) times daily. Dilute as instructed   phenytoin 125 MG/5ML suspension Commonly known as:  DILANTIN Place 4 mLs (100 mg total) into feeding tube 2 (two) times daily.   polyvinyl alcohol 1.4 % ophthalmic solution Commonly known as:  LIQUIFILM TEARS Place 1 drop into both eyes daily at 6 (six) AM.     prednisoLONE 15 MG/5ML Soln Commonly known as:  PRELONE Place into feeding tube. Give 6 mg every morning and 4 mg every evening at 6 pm   RA SALINE ENEMA 19-7 GM/118ML Enem Place 1 each rectally as needed (for constipation).   sterile water for irrigation Flush 238ml of water into peg tub every 4 hours       No orders of the defined types were placed in this encounter.   Immunization History  Administered Date(s) Administered  . Influenza Whole 12/31/2012  . Influenza-Unspecified 12/29/2011, 12/31/2012, 01/07/2014, 12/22/2014, 01/22/2016  . PPD Test 08/22/2008  . Pneumococcal-Unspecified 06/23/2010, 06/23/2015    Social History  Substance Use Topics  . Smoking status: Never Smoker  . Smokeless tobacco: Never Used  . Alcohol use No    Review of Systems  DATA OBTAINED: from patient, nurse, mother GENERAL:  no fevers, fatigue, appetite changes SKIN: No itching, rash HEENT: No complaint RESPIRATORY: No cough, wheezing, SOB CARDIAC: No chest pain, palpitations, lower extremity edema  GI: No abdominal pain,+ vomiting X2, no diarrhea or constipation, No heartburn or reflux  GU: No dysuria, frequency or urgency, or incontinence  MUSCULOSKELETAL: No unrelieved bone/joint pain NEUROLOGIC: No headache, dizziness  PSYCHIATRIC: No overt anxiety or sadness  Vitals:   04/01/16 1409  BP: 118/73  Pulse: 70  Resp: 17  Temp: 97.1 F (36.2 C)   There is no height or weight on file to calculate BMI. Physical Exam  GENERAL APPEARANCE: Alert,non  Conversant (baseline) , No acute distress  SKIN: No diaphoresis rash HEENT: Unremarkable RESPIRATORY: Breathing is even, unlabored. Lung sounds are rhonchi ; O2 sat at bedside is 97%   CARDIOVASCULAR: Heart RRR no murmurs, rubs or gallops. No peripheral edema  GASTROINTESTINAL: Abdomen is soft, non-tender, not distended w/ normal bowel sounds.PEG  GENITOURINARY: Bladder non tender, not distended  MUSCULOSKELETAL: No abnormal joints  or musculature NEUROLOGIC: Cranial nerves 2-12 grossly intact ; quad, some movement of BUE PSYCHIATRIC: Mood and affect appropriate to situation, no behavioral issues  Patient Active Problem List   Diagnosis Date Noted  . Itchy eyes 11/12/2015  . GERD (gastroesophageal reflux disease) 03/17/2015  . Tinea cruris 09/10/2014  . Aspiration pneumonia ([place]) 08/15/2014  . Abscess, prostate 08/15/2014  . Pyelonephritis 08/06/2014  . Nausea & vomiting   . Fever presenting with conditions classified elsewhere 03/16/2014  . Altered mental status 03/16/2014  . Acute bronchiolitis due to unspecified organism 03/11/2014  . Enteritis due to Clostridium difficile 03/11/2014  . Type 2 diabetes mellitus without complication ([place]) [id number]  . Septic shock ([place]) 01/26/2014  . Sepsis ([place]) 01/25/2014  . Acute encephalopathy 01/02/2014  . Acute respiratory failure with hypercapnia ([place]) 01/02/2014  . Chronic anemia 01/02/2014  .  Subdural hematoma ([place]) 01/02/2014  . Pain in thoracic spine 12/26/2013  . PEG (percutaneous endoscopic gastrostomy) status ([place]) 10/04/2013  . Rash and nonspecific skin eruption 07/08/2013  . Thrush 07/08/2013  . Heme positive stool 07/07/2013  . Severe sepsis ([place]) 06/23/2013  . E. coli pyelonephritis 06/22/2013  . SIRS (systemic inflammatory response syndrome) ([place]) 06/19/2013  . Leukocytosis 06/19/2013  . Bronchitis 06/19/2013  . Dysphagia 06/19/2013  . Aphasia 06/19/2013  . Other convulsions 08/10/2012  . Iron deficiency anemia 08/10/2012  . Glucocorticoid deficiency ([place]) 08/10/2012  . Febrile illness, acute 06/15/2012  . UTI (lower urinary tract infection) 06/15/2012  . Hypokalemia 11/10/2011  . Diabetic gastroparesis ([place]) 11/08/2011  . Ileus ([place]) 11/08/2011  . Hyponatremia 11/08/2011  . Adrenal insufficiency ([place]) 11/08/2011  . Quadriplegia ([place]) 11/08/2011  . Parkinson's disease ([place])   . Seizures ([place])   . Depression     CMP     Component Value  Date/Time   NA 134 (A) 01/25/2016   K 4.4 01/25/2016   CL 99 (L) 01/11/2016 0353   CO2 25 01/11/2016 0353   GLUCOSE 90 01/11/2016 0353   BUN 18 01/25/2016   CREATININE 0.5 (A) 01/25/2016   CREATININE 0.52 (L) 01/11/2016 0353   CALCIUM 8.3 (L) 01/11/2016 0353   PROT 7.7 01/11/2016 0353   ALBUMIN 3.3 (L) 01/11/2016 0353   AST 26 01/25/2016   ALT 17 01/25/2016   ALKPHOS 98 01/25/2016   BILITOT 0.3 01/11/2016 0353   GFRNONAA >60 01/11/2016 0353   GFRAA >60 01/11/2016 0353    Recent Labs  08/10/15 01/11/16 0353 01/25/16  NA 136* 131* 134*  K 4.1 3.7 4.4  CL  --  99*  --   CO2  --  25  --   GLUCOSE  --  90  --   BUN 16 17 18   CREATININE 0.6 0.52* 0.5*  CALCIUM  --  8.3*  --     Recent Labs  08/10/15 01/11/16 0353 01/25/16  AST 23 37 26  ALT 16 27 17   ALKPHOS 141* 114 98  BILITOT  --  0.3  --   PROT  --  7.7  --   ALBUMIN  --  3.3*  --     Recent Labs  08/10/15 01/11/16 0353 01/27/16  WBC 6.2 8.7 6.8  NEUTROABS  --  5.8  --   HGB 10.5* 11.0* 10.7*  HCT 33* 32.9* 35*  MCV  --  69.6*  --   PLT 239 192 373    Recent Labs  08/10/15 01/25/16 01/26/16  CHOL 157 138 136  LDLCALC 76 62 62  TRIG 75 60 61   Lab Results  Component Value Date   MICROALBUR 29.1 10/09/2015   Lab Results  Component Value Date   TSH 1.23 01/26/2016   Lab Results  Component Value Date   HGBA1C 5.0 01/27/2016   Lab Results  Component Value Date   CHOL 136 01/26/2016   HDL 62 01/26/2016   LDLCALC 62 01/26/2016   TRIG 61 01/26/2016    Significant Diagnostic Results in last 30 days:  No results found.  Assessment and Plan  VOMITING - pt is a quad and gets septic from time to time so will need to do a complete survey. There is norovirus going around the SNF. Have ordered CXR, U/A, CBC and CMP. Will cont to monitor with low threshold to send pt to hospital if fever develops.    Time spent > 25 min Inocencio Homes,  MD   

## 2016-04-10 ENCOUNTER — Encounter: Payer: Self-pay | Admitting: Internal Medicine

## 2016-04-10 NOTE — Assessment & Plan Note (Signed)
Chronic and stable; pt has some movement of upper extremities; plan to continue baclofen 10 mg per tube TID

## 2016-04-10 NOTE — Assessment & Plan Note (Signed)
Continues no reported seizures;plan to cont dilantin solution 100 mg BID0

## 2016-04-10 NOTE — Assessment & Plan Note (Addendum)
Most recent Hb is 10.7, stable from prior; plan to cont iron solution 330 mg per tiube daily

## 2016-04-28 ENCOUNTER — Non-Acute Institutional Stay (SKILLED_NURSING_FACILITY): Payer: Medicare Other | Admitting: Internal Medicine

## 2016-04-28 ENCOUNTER — Encounter: Payer: Self-pay | Admitting: Internal Medicine

## 2016-04-28 DIAGNOSIS — E2740 Unspecified adrenocortical insufficiency: Secondary | ICD-10-CM

## 2016-04-28 DIAGNOSIS — E119 Type 2 diabetes mellitus without complications: Secondary | ICD-10-CM | POA: Diagnosis not present

## 2016-04-28 DIAGNOSIS — D508 Other iron deficiency anemias: Secondary | ICD-10-CM | POA: Diagnosis not present

## 2016-04-28 DIAGNOSIS — Z20828 Contact with and (suspected) exposure to other viral communicable diseases: Secondary | ICD-10-CM

## 2016-04-28 NOTE — Progress Notes (Addendum)
Location:  [facility] Room Number: 214D Place of Service:  SNF [phone])  [name]. [name] Coil, MD  Patient Care Team: No Pcp Per Patient as PCP - General (General Practice)  Extended Emergency Contact Information Primary Emergency Contact: Reffner,[name] Address: [address]          [place], [place] [address] [name] Phone: [phone] Mobile Phone: [phone] Relation: Mother Secondary Emergency Contact: Constance Haw [place] Phone: [phone] Relation: Brother Father: Rilee, Feland [place] Mobile Phone: [phone]    Allergies: Aspirin; Keflex [cephalexin]; Nsaids; and Penicillins  Chief Complaint  Patient presents with  . Medical Management of Chronic Issues    Routine Visit    HPI: Patient is 61 y.o. male who is being seen for routine issues of adrenal insufficiency, , anemia, and DM2. ALSO, an influenza A outbreak has been declared in the SNF according to [facility] guidelines. Pt will need to be prophylaxed with tamiflu as he denies any , and nursing reports no, resp symptoms.   Past Medical History:  Diagnosis Date  . Adrenal insufficiency ([place])   . Anemia   . Aphasia 06/19/2013  . Depression   . Dysphagia 06/19/2013  . Parkinson's disease ([place])   . Quadriplegia ([place])   . Seizures ([place])     Past Surgical History:  Procedure Laterality Date  . PEG TUBE PLACEMENT    . PERIPHERALLY INSERTED CENTRAL CATHETER INSERTION    . TONSILLECTOMY  1962    Allergies as of 04/28/2016      Reactions   Aspirin Other (See Comments)   Unknown reaction; "blood doesn't clog too well" per mother.    Keflex [cephalexin]    Rash   Nsaids Other (See Comments)   Unknown reaction   Penicillins Hives, Other (See Comments)   Unknown reaction.  Pt has tolerated cephalosporins in the past.      Medication List       Accurate as of 04/28/16 11:59 PM. Always use your most recent med list.           acetaminophen 325 MG tablet Commonly known as:  TYLENOL Place 650 mg into feeding tube every 6 (six) hours as needed. Fever > 101   baclofen 10 MG tablet Commonly known as:  LIORESAL Place 1 tablet (10 mg total) into feeding tube 3 (three) times daily.   bisacodyl 10 MG suppository Commonly known as:  DULCOLAX Place 10 mg rectally daily as needed for moderate constipation. If constipation not relieved by milk of magnesia give one 10 mg suppository in 24hours   CENTAMIN Liqd 5 mLs by PEG Tube route daily.   CETAPHIL GENTLE CLEANSER Liqd Apply to face and neck with water and then pat dry daily.   ferrous sulfate 220 (44 Fe) MG/5ML solution Place 330 mg into feeding tube See admin instructions. Give 7.75ml  Per tube every morning   folic acid 1 MG tablet Commonly known as:  FOLVITE 1 mg by PEG Tube route daily.   loratadine 10 MG tablet Commonly known as:  CLARITIN 10 mg by PEG Tube route daily.   magnesium hydroxide 400 MG/5ML suspension Commonly known as:  MILK OF MAGNESIA Take 30 mLs by mouth daily as needed for mild constipation.   metoCLOPramide 10 MG tablet Commonly known as:  REGLAN Place 1 tablet (10 mg total) into feeding tube 4 (four) times daily -  before meals and at bedtime. Via Peg  tube   NUTRITIONAL SUPPLEMENTS PO Give Two Cal HN at 55cc/hr x 20 hrs via peg ( HOLD 1 HOUR PRE/POST PHENTON ADMINISTRATION)   olopatadine 0.1 % ophthalmic solution Commonly known as:  PATANOL Place 1 drop into both eyes every morning.   pantoprazole 40 MG tablet Commonly known as:  PROTONIX Give 40 mg by tube 2 (two) times daily. Dilute as instructed   phenytoin 125 MG/5ML suspension Commonly known as:  DILANTIN Place 4 mLs (100 mg total) into feeding tube 2 (two) times daily.   polyvinyl alcohol 1.4 % ophthalmic solution Commonly known as:  LIQUIFILM TEARS Place 1 drop into both eyes daily at 6 (six) AM.   prednisoLONE 15 MG/5ML Soln Commonly known as:   PRELONE Place into feeding tube. Give 6 mg every morning and 4 mg every evening at 6 pm   RA SALINE ENEMA 19-7 GM/118ML Enem Place 1 each rectally as needed (for constipation).   sterile water for irrigation Flush 25ml of water into peg tub every 4 hours       No orders of the defined types were placed in this encounter.   Immunization History  Administered Date(s) Administered  . Influenza Whole 12/31/2012  . Influenza-Unspecified 12/29/2011, 12/31/2012, 01/07/2014, 12/22/2014, 01/22/2016  . PPD Test 08/22/2008  . Pneumococcal-Unspecified 06/23/2010, 06/23/2015    Social History  Substance Use Topics  . Smoking status: Never Smoker  . Smokeless tobacco: Never Used  . Alcohol use No    Review of Systems  DATA OBTAINED: from patient, nurse GENERAL:  no fevers, fatigue, appetite changes SKIN: No itching, rash HEENT: No complaint RESPIRATORY: No cough, wheezing, SOB CARDIAC: No chest pain, palpitations, lower extremity edema  GI: No abdominal pain, No N/V/D or constipation, No heartburn or reflux ; PEG tube GU: No dysuria, frequency or urgency, or incontinence  MUSCULOSKELETAL: No unrelieved bone/joint pain NEUROLOGIC: No headache, dizziness  PSYCHIATRIC: No overt anxiety or sadness  Vitals:   04/28/16 0911  BP: 118/73  Pulse: 78  Resp: (!) 22  Temp: 97.1 F (36.2 C)   Body mass index is 21.9 kg/m. Physical Exam  GENERAL APPEARANCE: Alert, converses with pointing, nodding head, No acute distress  SKIN: No diaphoresis rash HEENT: Unremarkable RESPIRATORY: Breathing is even, unlabored. Lung sounds are clear   CARDIOVASCULAR: Heart RRR no murmurs, rubs or gallops. No peripheral edema  GASTROINTESTINAL: Abdomen is soft, non-tender, not distended w/ normal bowel sounds; PEG  GENITOURINARY: Bladder non tender, not distended  MUSCULOSKELETAL: wasting and contractures NEUROLOGIC: Cranial nerves 2-12 grossly intact; quad, has some movement of BUE PSYCHIATRIC:  Mood and affect appropriate to situation, no behavioral issues  Patient Active Problem List   Diagnosis Date Noted  . Itchy eyes 11/12/2015  . GERD (gastroesophageal reflux disease) 03/17/2015  . Tinea cruris 09/10/2014  . Aspiration pneumonia ([place]) 08/15/2014  . Abscess, prostate 08/15/2014  . Pyelonephritis 08/06/2014  . Nausea & vomiting   . Fever presenting with conditions classified elsewhere 03/16/2014  . Altered mental status 03/16/2014  . Acute bronchiolitis due to unspecified organism 03/11/2014  . Enteritis due to Clostridium difficile 03/11/2014  . Type 2 diabetes mellitus without complication ([place]) [id number]  . Septic shock ([place]) 01/26/2014  . Sepsis ([place]) 01/25/2014  . Acute encephalopathy 01/02/2014  . Acute respiratory failure with hypercapnia ([place]) 01/02/2014  . Chronic anemia 01/02/2014  . Subdural hematoma ([place]) 01/02/2014  . Pain in thoracic spine 12/26/2013  . PEG (percutaneous endoscopic gastrostomy) status ([place]) 10/04/2013  . Rash and nonspecific skin  eruption 07/08/2013  . Thrush 07/08/2013  . Heme positive stool 07/07/2013  . Severe sepsis ([place]) 06/23/2013  . E. coli pyelonephritis 06/22/2013  . SIRS (systemic inflammatory response syndrome) ([place]) 06/19/2013  . Leukocytosis 06/19/2013  . Bronchitis 06/19/2013  . Dysphagia 06/19/2013  . Aphasia 06/19/2013  . Other convulsions 08/10/2012  . Iron deficiency anemia 08/10/2012  . Glucocorticoid deficiency ([place]) 08/10/2012  . Febrile illness, acute 06/15/2012  . UTI (lower urinary tract infection) 06/15/2012  . Hypokalemia 11/10/2011  . Diabetic gastroparesis ([place]) 11/08/2011  . Ileus ([place]) 11/08/2011  . Hyponatremia 11/08/2011  . Adrenal insufficiency ([place]) 11/08/2011  . Quadriplegia ([place]) 11/08/2011  . Parkinson's disease ([place])   . Seizures ([place])   . Depression     CMP     Component Value Date/Time   NA 135 (A) 03/26/2016   K 4.0 03/26/2016   CL 99 (L) 01/11/2016 0353   CO2 25 01/11/2016  0353   GLUCOSE 90 01/11/2016 0353   BUN 23 (A) 03/26/2016   CREATININE 0.5 (A) 03/26/2016   CREATININE 0.52 (L) 01/11/2016 0353   CALCIUM 8.3 (L) 01/11/2016 0353   PROT 7.7 01/11/2016 0353   ALBUMIN 3.3 (L) 01/11/2016 0353   AST 28 03/26/2016   ALT 18 03/26/2016   ALKPHOS 129 (A) 03/26/2016   BILITOT 0.3 01/11/2016 0353   GFRNONAA >60 01/11/2016 0353   GFRAA >60 01/11/2016 0353    Recent Labs  01/11/16 0353 01/25/16 03/26/16  NA 131* 134* 135*  K 3.7 4.4 4.0  CL 99*  --   --   CO2 25  --   --   GLUCOSE 90  --   --   BUN 17 18 23*  CREATININE 0.52* 0.5* 0.5*  CALCIUM 8.3*  --   --     Recent Labs  01/11/16 0353 01/25/16 03/26/16  AST 37 26 28  ALT 27 17 18   ALKPHOS 114 98 129*  BILITOT 0.3  --   --   PROT 7.7  --   --   ALBUMIN 3.3*  --   --     Recent Labs  01/11/16 0353 01/27/16 03/26/16  WBC 8.7 6.8 6.6  NEUTROABS 5.8  --   --   HGB 11.0* 10.7* 12.1*  HCT 32.9* 35* 39*  MCV 69.6*  --   --   PLT 192 373 247    Recent Labs  08/10/15 01/25/16 01/26/16  CHOL 157 138 136  LDLCALC 76 62 62  TRIG 75 60 61   Lab Results  Component Value Date   MICROALBUR 29.1 10/09/2015   Lab Results  Component Value Date   TSH 1.23 01/26/2016   Lab Results  Component Value Date   HGBA1C 5.0 01/27/2016   Lab Results  Component Value Date   CHOL 136 01/26/2016   HDL 62 01/26/2016   LDLCALC 62 01/26/2016   TRIG 61 01/26/2016    Significant Diagnostic Results in last 30 days:  No results found.  Assessment and Plan  Adrenal insufficiency Chronic and stable;cont prelone syrup 6mg  qa and 4 mg qHs  Iron deficiency anemia Hb is 12.1 which is improved; plan to cont iron 330 mg PT daily and folate 1 mg PT daily  Type 2 diabetes mellitus without complication Well controlled on tube feeds alone; A1c 5.0 last lab; will monitor at intervals; pt not on ACE or statin  EXPOSURE TO FLU A- pt has no sx, therefore will be prophyaxed with tamiflu. CrCl was calculated  by  me and pt's dose of tamiflu wil be 75 mg daily for 14 days minimum.   Time spent > 35 min;> 50% of time with patient was spent reviewing records, labs, tests and studies, counseling and developing plan of care  [name]. [name] Coil, MD

## 2016-05-03 LAB — HM DIABETES EYE EXAM

## 2016-05-22 ENCOUNTER — Encounter: Payer: Self-pay | Admitting: Internal Medicine

## 2016-05-22 NOTE — Assessment & Plan Note (Signed)
Chronic and stable;cont prelone syrup 6mg  qa and 4 mg qHs

## 2016-05-22 NOTE — Assessment & Plan Note (Signed)
Hb is 12.1 which is improved; plan to cont iron 330 mg PT daily and folate 1 mg PT daily

## 2016-05-22 NOTE — Assessment & Plan Note (Signed)
Well controlled on tube feeds alone; A1c 5.0 last lab; will monitor at intervals; pt not on ACE or statin

## 2016-05-27 ENCOUNTER — Non-Acute Institutional Stay (SKILLED_NURSING_FACILITY): Payer: Medicare Other | Admitting: Internal Medicine

## 2016-05-27 ENCOUNTER — Encounter: Payer: Self-pay | Admitting: Internal Medicine

## 2016-05-27 DIAGNOSIS — R569 Unspecified convulsions: Secondary | ICD-10-CM | POA: Diagnosis not present

## 2016-05-27 DIAGNOSIS — E1143 Type 2 diabetes mellitus with diabetic autonomic (poly)neuropathy: Secondary | ICD-10-CM | POA: Diagnosis not present

## 2016-05-27 DIAGNOSIS — K3184 Gastroparesis: Secondary | ICD-10-CM | POA: Diagnosis not present

## 2016-05-27 DIAGNOSIS — K219 Gastro-esophageal reflux disease without esophagitis: Secondary | ICD-10-CM

## 2016-05-27 NOTE — Progress Notes (Signed)
Location:  [name] Room Number: 214D Place of Service:  SNF [phone])  [name]. [name], MD  Patient Care Team: No Pcp Per Patient as PCP - General (General Practice)  Extended Emergency Contact Information Primary Emergency Contact: Chase,[name] Address: [address]          [place], [place] [address] [name] [name]'s Phone: [phone] Mobile Phone: [phone] Relation: Mother Secondary Emergency Contact: [name] [place] of [place] Phone: [phone] Relation: Brother Father: [name], Chase [place] Mobile Phone: [phone]    Allergies: Aspirin; Keflex [cephalexin]; Nsaids; and Penicillins  Chief Complaint  Patient presents with  . Medical Management of Chronic Issues    Routine Visit    HPI: Patient is 61 y.o. male who is being seen for routine issues of diabetic gastroparesis, GERD and seizures.  Past Medical History:  Diagnosis Date  . Adrenal insufficiency ([place])   . Anemia   . Aphasia 06/19/2013  . Depression   . Dysphagia 06/19/2013  . Parkinson's disease ([place])   . Quadriplegia ([name])   . Seizures ([place])     Past Surgical History:  Procedure Laterality Date  . PEG TUBE PLACEMENT    . PERIPHERALLY INSERTED CENTRAL CATHETER INSERTION    . TONSILLECTOMY  1962    Allergies as of 05/27/2016      Reactions   Aspirin Other (See Comments)   Unknown reaction; "blood doesn't clog too well" per mother.    Keflex [cephalexin]    Rash   Nsaids Other (See Comments)   Unknown reaction   Penicillins Hives, Other (See Comments)   Unknown reaction.  Pt has tolerated cephalosporins in the past.      Medication List       Accurate as of 05/27/16 11:59 PM. Always use your most recent med list.          acetaminophen 325 MG tablet Commonly known as:  TYLENOL Place 650 mg into feeding tube every 6 (six) hours as needed. Fever > 101   baclofen 10 MG tablet Commonly known  as:  LIORESAL Place 1 tablet (10 mg total) into feeding tube 3 (three) times daily.   bisacodyl 10 MG suppository Commonly known as:  DULCOLAX Place 10 mg rectally daily as needed for moderate constipation. If constipation not relieved by milk of magnesia give one 10 mg suppository in 24hours   CENTAMIN Liqd 5 mLs by PEG Tube route daily.   CETAPHIL GENTLE CLEANSER Liqd Apply to face and neck with water and then pat dry daily.   ferrous sulfate 220 (44 Fe) MG/5ML solution Place 330 mg into feeding tube See admin instructions. Give 7.13ml  Per tube every morning   folic acid 1 MG tablet Commonly known as:  FOLVITE 1 mg by PEG Tube route daily.   loratadine 10 MG tablet Commonly known as:  CLARITIN 10 mg by PEG Tube route daily.   magnesium hydroxide 400 MG/5ML suspension Commonly known as:  MILK OF MAGNESIA Take 30 mLs by mouth daily as needed for mild constipation.   metoCLOPramide 10 MG tablet Commonly known as:  REGLAN Place 1 tablet (10 mg total) into feeding tube 4 (four) times daily -  before meals and at bedtime. Via Peg tube   NUTRITIONAL SUPPLEMENTS PO Give Two Cal HN at 55cc/hr x 20 hrs via peg ( HOLD 1 HOUR PRE/POST PHENTON ADMINISTRATION)   olopatadine 0.1 % ophthalmic solution Commonly known as:  PATANOL  Place 1 drop into both eyes every morning.   pantoprazole sodium 40 mg/20 mL Pack Commonly known as:  PROTONIX Place 40 mg into feeding tube 2 (two) times daily.   phenytoin 125 MG/5ML suspension Commonly known as:  DILANTIN Place 4 mLs (100 mg total) into feeding tube 2 (two) times daily.   polyvinyl alcohol 1.4 % ophthalmic solution Commonly known as:  LIQUIFILM TEARS Place 1 drop into both eyes daily at 6 (six) AM.   prednisoLONE 15 MG/5ML Soln Commonly known as:  PRELONE Place into feeding tube. Give 6 mg every morning and 4 mg every evening at 6 pm   promethazine 25 MG tablet Commonly known as:  PHENERGAN Place 25 mg into feeding tube every 6  (six) hours as needed for nausea or vomiting.   RA SALINE ENEMA 19-7 GM/118ML Enem Place 1 each rectally as needed (for constipation).   sterile water for irrigation Flush 264ml of water into peg tub every 4 hours       Meds ordered this encounter  Medications  . pantoprazole sodium (PROTONIX) 40 mg/20 mL PACK    Sig: Place 40 mg into feeding tube 2 (two) times daily.  . promethazine (PHENERGAN) 25 MG tablet    Sig: Place 25 mg into feeding tube every 6 (six) hours as needed for nausea or vomiting.    Immunization History  Administered Date(s) Administered  . Influenza Whole 12/31/2012  . Influenza-Unspecified 12/29/2011, 12/31/2012, 01/07/2014, 12/22/2014, 01/22/2016  . PPD Test 08/22/2008  . Pneumococcal-Unspecified 06/23/2010, 06/23/2015    Social History  Substance Use Topics  . Smoking status: Never Smoker  . Smokeless tobacco: Never Used  . Alcohol use No    Review of Systems  DATA OBTAINED: from patient, nurse GENERAL:  no fevers, fatigue, appetite changes SKIN: No itching, rash HEENT: No complaint RESPIRATORY: No cough, wheezing, SOB CARDIAC: No chest pain, palpitations, lower extremity edema  GI: No abdominal pain, No N/V/D or constipation, No heartburn or reflux  GU: No dysuria, frequency or urgency, or incontinence  MUSCULOSKELETAL: No unrelieved bone/joint pain NEUROLOGIC: No headache, dizziness  PSYCHIATRIC: No overt anxiety or sadness  Vitals:   05/27/16 1343  BP: 118/73  Pulse: 86  Resp: 18  Temp: 97.1 F (36.2 C)   Body mass index is 21.9 kg/m. Physical Exam  GENERAL APPEARANCE: Alert, non conversant but can write and make gestures, No acute distress  SKIN: No diaphoresis rash HEENT: Unremarkable RESPIRATORY: Breathing is even, unlabored. Lung sounds are clear   CARDIOVASCULAR: Heart RRR no murmurs, rubs or gallops. No peripheral edema  GASTROINTESTINAL: Abdomen is soft, non-tender, not distended w/ normal bowel sounds.  GENITOURINARY:  Bladder non tender, not distended  MUSCULOSKELETAL: wasting and contractures NEUROLOGIC: Cranial nerves 2-12 grossly intact; quad, some movement BUE PSYCHIATRIC: Mood and affect appropriate to situation, no behavioral issues  Patient Active Problem List   Diagnosis Date Noted  . Itchy eyes 11/12/2015  . GERD (gastroesophageal reflux disease) 03/17/2015  . Tinea cruris 09/10/2014  . Aspiration pneumonia ([place]) 08/15/2014  . Abscess, prostate 08/15/2014  . Pyelonephritis 08/06/2014  . Nausea & vomiting   . Fever presenting with conditions classified elsewhere 03/16/2014  . Altered mental status 03/16/2014  . Acute bronchiolitis due to unspecified organism 03/11/2014  . Enteritis due to Clostridium difficile 03/11/2014  . Type 2 diabetes mellitus without complication ([place]) [id number]  . Septic shock ([place]) 01/26/2014  . Sepsis ([place]) 01/25/2014  . Acute encephalopathy 01/02/2014  . Acute respiratory failure with hypercapnia ([facility])  01/02/2014  . Chronic anemia 01/02/2014  . Subdural hematoma ([place]) 01/02/2014  . Pain in thoracic spine 12/26/2013  . PEG (percutaneous endoscopic gastrostomy) status ([place]) 10/04/2013  . Rash and nonspecific skin eruption 07/08/2013  . Thrush 07/08/2013  . Heme positive stool 07/07/2013  . Severe sepsis ([place]) 06/23/2013  . E. coli pyelonephritis 06/22/2013  . SIRS (systemic inflammatory response syndrome) ([place]) 06/19/2013  . Leukocytosis 06/19/2013  . Bronchitis 06/19/2013  . Dysphagia 06/19/2013  . Aphasia 06/19/2013  . Other convulsions 08/10/2012  . Iron deficiency anemia 08/10/2012  . Glucocorticoid deficiency ([place]) 08/10/2012  . Febrile illness, acute 06/15/2012  . UTI (lower urinary tract infection) 06/15/2012  . Hypokalemia 11/10/2011  . Diabetic gastroparesis ([place]) 11/08/2011  . Ileus ([place]) 11/08/2011  . Hyponatremia 11/08/2011  . Adrenal insufficiency ([place]) 11/08/2011  . Quadriplegia ([place]) 11/08/2011  . Parkinson's disease ([place])   .  Seizures ([place])   . Depression     CMP     Component Value Date/Time   NA 135 (A) 03/26/2016   K 4.0 03/26/2016   CL 99 (L) 01/11/2016 0353   CO2 25 01/11/2016 0353   GLUCOSE 90 01/11/2016 0353   BUN 23 (A) 03/26/2016   CREATININE 0.5 (A) 03/26/2016   CREATININE 0.52 (L) 01/11/2016 0353   CALCIUM 8.3 (L) 01/11/2016 0353   PROT 7.7 01/11/2016 0353   ALBUMIN 3.3 (L) 01/11/2016 0353   AST 28 03/26/2016   ALT 18 03/26/2016   ALKPHOS 129 (A) 03/26/2016   BILITOT 0.3 01/11/2016 0353   GFRNONAA >60 01/11/2016 0353   GFRAA >60 01/11/2016 0353    Recent Labs  01/11/16 0353 01/25/16 03/26/16  NA 131* 134* 135*  K 3.7 4.4 4.0  CL 99*  --   --   CO2 25  --   --   GLUCOSE 90  --   --   BUN 17 18 23*  CREATININE 0.52* 0.5* 0.5*  CALCIUM 8.3*  --   --     Recent Labs  01/11/16 0353 01/25/16 03/26/16  AST 37 26 28  ALT 27 17 18   ALKPHOS 114 98 129*  BILITOT 0.3  --   --   PROT 7.7  --   --   ALBUMIN 3.3*  --   --     Recent Labs  01/11/16 0353 01/27/16 03/26/16  WBC 8.7 6.8 6.6  NEUTROABS 5.8  --   --   HGB 11.0* 10.7* 12.1*  HCT 32.9* 35* 39*  MCV 69.6*  --   --   PLT 192 373 247    Recent Labs  08/10/15 01/25/16 01/26/16  CHOL 157 138 136  LDLCALC 76 62 62  TRIG 75 60 61   Lab Results  Component Value Date   MICROALBUR 29.1 10/09/2015   Lab Results  Component Value Date   TSH 1.23 01/26/2016   Lab Results  Component Value Date   HGBA1C 5.0 01/27/2016   Lab Results  Component Value Date   CHOL 136 01/26/2016   HDL 62 01/26/2016   LDLCALC 62 01/26/2016   TRIG 61 01/26/2016    Significant Diagnostic Results in last 30 days:  No results found.  Assessment and Plan  Diabetic gastroparesis No reports of reflux or aspiration; cont reglan 10 mg per tibe QID  GERD (gastroesophageal reflux disease) No aspiration or reflux; cont protonix 40 mg daily  Seizures Stable, no seizures reported; cont dilantin solution 100 mg per tube BID     Hussien Greenblatt  D.  [name], MD

## 2016-06-11 ENCOUNTER — Encounter: Payer: Self-pay | Admitting: Internal Medicine

## 2016-06-11 NOTE — Assessment & Plan Note (Signed)
No reports of reflux or aspiration; cont reglan 10 mg per tibe QID

## 2016-06-11 NOTE — Assessment & Plan Note (Signed)
Stable, no seizures reported; cont dilantin solution 100 mg per tube BID

## 2016-06-11 NOTE — Assessment & Plan Note (Signed)
No aspiration or reflux; cont protonix 40 mg daily

## 2016-06-13 DIAGNOSIS — F809 Developmental disorder of speech and language, unspecified: Secondary | ICD-10-CM | POA: Insufficient documentation

## 2016-06-13 DIAGNOSIS — Z931 Gastrostomy status: Secondary | ICD-10-CM | POA: Insufficient documentation

## 2016-06-13 DIAGNOSIS — K5900 Constipation, unspecified: Secondary | ICD-10-CM | POA: Insufficient documentation

## 2016-06-13 DIAGNOSIS — Z993 Dependence on wheelchair: Secondary | ICD-10-CM | POA: Insufficient documentation

## 2016-06-13 DIAGNOSIS — I6203 Nontraumatic chronic subdural hemorrhage: Secondary | ICD-10-CM | POA: Insufficient documentation

## 2016-06-13 DIAGNOSIS — J3089 Other allergic rhinitis: Secondary | ICD-10-CM | POA: Insufficient documentation

## 2016-06-13 DIAGNOSIS — M24542 Contracture, left hand: Secondary | ICD-10-CM | POA: Insufficient documentation

## 2016-06-13 DIAGNOSIS — R4701 Aphasia: Secondary | ICD-10-CM | POA: Insufficient documentation

## 2016-06-13 DIAGNOSIS — H269 Unspecified cataract: Secondary | ICD-10-CM | POA: Insufficient documentation

## 2016-06-15 DIAGNOSIS — B351 Tinea unguium: Secondary | ICD-10-CM | POA: Diagnosis not present

## 2016-06-15 DIAGNOSIS — I70203 Unspecified atherosclerosis of native arteries of extremities, bilateral legs: Secondary | ICD-10-CM | POA: Diagnosis not present

## 2016-06-15 LAB — HM DIABETES FOOT EXAM

## 2016-06-26 ENCOUNTER — Emergency Department (HOSPITAL_COMMUNITY): Payer: Medicare Other

## 2016-06-26 ENCOUNTER — Emergency Department (HOSPITAL_COMMUNITY)
Admission: EM | Admit: 2016-06-26 | Discharge: 2016-06-26 | Disposition: A | Discharge status: Home / Self Care | Payer: Medicare Other | Attending: Emergency Medicine | Admitting: Emergency Medicine

## 2016-06-26 ENCOUNTER — Encounter (HOSPITAL_COMMUNITY): Payer: Self-pay

## 2016-06-26 DIAGNOSIS — G20 Parkinson's disease: Secondary | ICD-10-CM | POA: Insufficient documentation

## 2016-06-26 DIAGNOSIS — R112 Nausea with vomiting, unspecified: Secondary | ICD-10-CM | POA: Diagnosis not present

## 2016-06-26 DIAGNOSIS — Z79899 Other long term (current) drug therapy: Secondary | ICD-10-CM | POA: Insufficient documentation

## 2016-06-26 DIAGNOSIS — N390 Urinary tract infection, site not specified: Secondary | ICD-10-CM | POA: Diagnosis not present

## 2016-06-26 DIAGNOSIS — R509 Fever, unspecified: Secondary | ICD-10-CM | POA: Diagnosis not present

## 2016-06-26 DIAGNOSIS — R1111 Vomiting without nausea: Secondary | ICD-10-CM | POA: Diagnosis not present

## 2016-06-26 DIAGNOSIS — N342 Other urethritis: Secondary | ICD-10-CM | POA: Diagnosis not present

## 2016-06-26 DIAGNOSIS — E119 Type 2 diabetes mellitus without complications: Secondary | ICD-10-CM | POA: Diagnosis not present

## 2016-06-26 DIAGNOSIS — R402421 Glasgow coma scale score 9-12, in the field [EMT or ambulance]: Secondary | ICD-10-CM | POA: Diagnosis not present

## 2016-06-26 DIAGNOSIS — R1110 Vomiting, unspecified: Secondary | ICD-10-CM | POA: Diagnosis not present

## 2016-06-26 LAB — CBC WITH DIFFERENTIAL/PLATELET
Basophils Absolute: 0 10*3/uL (ref 0.0–0.1)
Basophils Relative: 0 %
EOS ABS: 0 10*3/uL (ref 0.0–0.7)
Eosinophils Relative: 0 %
HEMATOCRIT: 36.9 % — AB (ref 39.0–52.0)
Hemoglobin: 12.2 g/dL — ABNORMAL LOW (ref 13.0–17.0)
Lymphocytes Relative: 7 %
Lymphs Abs: 0.8 10*3/uL (ref 0.7–4.0)
MCH: 23.8 pg — AB (ref 26.0–34.0)
MCHC: 33.1 g/dL (ref 30.0–36.0)
MCV: 71.9 fL — AB (ref 78.0–100.0)
MONO ABS: 0.3 10*3/uL (ref 0.1–1.0)
Monocytes Relative: 3 %
NEUTROS PCT: 90 %
Neutro Abs: 10.2 10*3/uL — ABNORMAL HIGH (ref 1.7–7.7)
PLATELETS: 183 10*3/uL (ref 150–400)
RBC: 5.13 MIL/uL (ref 4.22–5.81)
RDW: 15 % (ref 11.5–15.5)
WBC: 11.3 10*3/uL — AB (ref 4.0–10.5)

## 2016-06-26 LAB — COMPREHENSIVE METABOLIC PANEL
ALT: 20 U/L (ref 17–63)
AST: 29 U/L (ref 15–41)
Albumin: 3.6 g/dL (ref 3.5–5.0)
Alkaline Phosphatase: 105 U/L (ref 38–126)
Anion gap: 7 (ref 5–15)
BILIRUBIN TOTAL: 0.5 mg/dL (ref 0.3–1.2)
BUN: 22 mg/dL — AB (ref 6–20)
CALCIUM: 9.1 mg/dL (ref 8.9–10.3)
CHLORIDE: 108 mmol/L (ref 101–111)
CO2: 26 mmol/L (ref 22–32)
CREATININE: 0.66 mg/dL (ref 0.61–1.24)
GFR calc non Af Amer: >60 mL/min (ref >60)
Glucose, Bld: 107 mg/dL — ABNORMAL HIGH (ref 65–99)
Potassium: 3.9 mmol/L (ref 3.5–5.1)
Sodium: 141 mmol/L (ref 135–145)
Total Protein: 8.3 g/dL — ABNORMAL HIGH (ref 6.5–8.1)

## 2016-06-26 LAB — PHENYTOIN LEVEL, TOTAL: PHENYTOIN LVL: 4.6 ug/mL — AB (ref 10.0–20.0)

## 2016-06-26 LAB — I-STAT CG4 LACTIC ACID, ED: LACTIC ACID, VENOUS: 0.99 mmol/L (ref 0.5–1.9)

## 2016-06-26 LAB — BASIC METABOLIC PANEL
BUN: 22 mg/dL — AB (ref 4–21)
CREATININE: 0.7 mg/dL (ref 0.6–1.3)
Glucose: 107 mg/dL
SODIUM: 141 mmol/L (ref 137–147)

## 2016-06-26 LAB — URINALYSIS, ROUTINE W REFLEX MICROSCOPIC
BILIRUBIN URINE: NEGATIVE
Glucose, UA: NEGATIVE mg/dL
Ketones, ur: NEGATIVE mg/dL
Nitrite: NEGATIVE
PH: 8 (ref 5.0–8.0)
Protein, ur: 100 mg/dL — AB
SPECIFIC GRAVITY, URINE: 1.016 (ref 1.005–1.030)
SQUAMOUS EPITHELIAL / LPF: NONE SEEN

## 2016-06-26 LAB — HEPATIC FUNCTION PANEL: Bilirubin, Total: 0.5 mg/dL

## 2016-06-26 LAB — CBC AND DIFFERENTIAL: WBC: 11.3 10*3/mL

## 2016-06-26 LAB — LIPASE, BLOOD: LIPASE: 26 U/L (ref 11–51)

## 2016-06-26 MED ORDER — FOSFOMYCIN TROMETHAMINE 3 G PO PACK
3.0000 g | PACK | Freq: Once | ORAL | Status: AC
  Administered 2016-06-26: 3 g
  Filled 2016-06-26 (×2): qty 3

## 2016-06-26 MED ORDER — ONDANSETRON HCL 4 MG/2ML IJ SOLN
4.0000 mg | Freq: Once | INTRAMUSCULAR | Status: AC
  Administered 2016-06-26: 4 mg via INTRAVENOUS
  Filled 2016-06-26: qty 2

## 2016-06-26 MED ORDER — FOSFOMYCIN TROMETHAMINE 3 G PO PACK
3.0000 g | PACK | Freq: Once | ORAL | 0 refills | Status: AC
Start: 2016-06-26 — End: 2016-06-26

## 2016-06-26 MED ORDER — SODIUM CHLORIDE 0.9 % IV BOLUS (SEPSIS)
1000.0000 mL | Freq: Once | INTRAVENOUS | Status: AC
  Administered 2016-06-26: 1000 mL via INTRAVENOUS

## 2016-06-26 MED ORDER — ACETAMINOPHEN 160 MG/5ML PO SOLN
650.0000 mg | Freq: Once | ORAL | Status: AC
  Administered 2016-06-26: 650 mg
  Filled 2016-06-26: qty 20.3

## 2016-06-26 NOTE — ED Notes (Signed)
Pt is just waiting on PTAR

## 2016-06-26 NOTE — ED Provider Notes (Signed)
[facility] DEPT Provider Note   CSN: [id number] Arrival date & time: 06/26/16  1318     History   Chief Complaint Chief Complaint  Patient presents with  . nausea and vomiting    HPI [name] is a 60 y.o. male history of quadriplegia for many years, bed bound at baseline, current UTI here presenting with chills, vomiting. History of gastroparesis and is on Phenergan prn. He is currently living at  [facility] Patient had 2 episodes of vomiting today. He was given Phenergan and the vomiting stopped. Mother did go and visit him today and noticed that he feels warm. She is concerned that patient may have a urinary tract infection. Patient is quadriplegic and has trouble speaking at baseline and is difficult to understand. Per mother this is baseline and is unchanged. Patient has a G tube and is getting tube feeds and is not taking anything by mouth.    The history is provided by the patient, the EMS personnel and a parent.    Level V caveat- quadriplegia, baseline aphasia   Past Medical History:  Diagnosis Date  . Adrenal insufficiency ([place])   . Anemia   . Aphasia 06/19/2013  . Depression   . Dysphagia 06/19/2013  . Parkinson's disease ([place])   . Quadriplegia ([place])   . Seizures [facility])     Patient Active Problem List   Diagnosis Date Noted  . Cataract 06/13/2016  . Constipation 06/13/2016  . Contracture of joint of left hand 06/13/2016  . Impaired verbal communication 06/13/2016  . 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 Nonverbal 06/13/2016  . Wheelchair bound 06/13/2016  . Allergic rhinitis 06/13/2016  . S/P percutaneous endoscopic gastrostomy (PEG) tube placement ([place]) 06/13/2016  . Itchy eyes 11/12/2015  . Benign neoplasm of colon 05/22/2015  . History of colon polyps 05/22/2015  . Lung nodule 05/22/2015  . Abnormal levels of other serum enzymes 05/22/2015  . Gastroesophageal reflux disease 03/17/2015  . Tinea cruris  09/10/2014  . Aspiration pneumonia ([place]) 08/15/2014  . Abscess, prostate 08/15/2014  . Pyelonephritis 08/06/2014  . Nausea & vomiting   . Fever presenting with conditions classified elsewhere 03/16/2014  . Altered mental status 03/16/2014  . Acute bronchiolitis due to unspecified organism 03/11/2014  . Enteritis due to Clostridium difficile 03/11/2014  . Type 2 diabetes mellitus without complication ([place]) 48/18/5631  . Septic shock ([place]) 01/26/2014  . Sepsis ([place]) 01/25/2014  . Acute encephalopathy 01/02/2014  . Acute respiratory failure with hypercapnia ([place]) 01/02/2014  . Chronic anemia 01/02/2014  . Subdural hematoma ([place]) 01/02/2014  . Pain in thoracic spine 12/26/2013  . PEG (percutaneous endoscopic gastrostomy) status ([place]) 10/04/2013  . Rash and nonspecific skin eruption 07/08/2013  . Thrush 07/08/2013  . Heme positive stool 07/07/2013  . Severe sepsis ([place]) 06/23/2013  . E. coli pyelonephritis 06/22/2013  . SIRS (systemic inflammatory response syndrome) ([place]) 06/19/2013  . Leukocytosis 06/19/2013  . Bronchitis 06/19/2013  . Dysphagia 06/19/2013  . Aphasia 06/19/2013  . Other convulsions 08/10/2012  . Iron deficiency anemia 08/10/2012  . Glucocorticoid deficiency ([place]) 08/10/2012  . Febrile illness, acute 06/15/2012  . UTI (lower urinary tract infection) 06/15/2012  . Hypokalemia 11/10/2011  . Diabetic gastroparesis ([place]) 11/08/2011  . Ileus ([place]) 11/08/2011  . Hyponatremia 11/08/2011  . Adrenal insufficiency ([place]) 11/08/2011  . Quadriplegia ([place]) 11/08/2011  . Parkinson disease ([place])   . Seizures ([place])   . Depression     Past Surgical History:  Procedure Laterality Date  .  PEG TUBE PLACEMENT    . PERIPHERALLY INSERTED CENTRAL CATHETER INSERTION    . TONSILLECTOMY  1962       Home Medications    Prior to Admission medications   Medication Sig Start Date End Date Taking? Authorizing Provider  acetaminophen (TYLENOL) 325 MG tablet Place 650 mg into  feeding tube every 6 (six) hours as needed. Fever > 101   Yes Historical Provider, MD  baclofen (LIORESAL) 10 MG tablet Place 1 tablet (10 mg total) into feeding tube 3 (three) times daily. 08/12/14  Yes Shanker Kristeen Mans, MD  bisacodyl (DULCOLAX) 10 MG suppository Place 10 mg rectally daily as needed for moderate constipation. If constipation not relieved by milk of magnesia give one 10 mg suppository in 24hours   Yes Historical Provider, MD  ferrous sulfate 220 (44 FE) MG/5ML solution Place 330 mg into feeding tube See admin instructions. Give 7.45ml  Per tube every morning   Yes Historical Provider, MD  folic acid (FOLVITE) 1 MG tablet 1 mg by PEG Tube route daily.   Yes Historical Provider, MD  loratadine (CLARITIN) 10 MG tablet 10 mg by PEG Tube route daily.   Yes Historical Provider, MD  magnesium hydroxide (MILK OF MAGNESIA) 400 MG/5ML suspension Take 30 mLs by mouth daily as needed for mild constipation.   Yes Historical Provider, MD  metoCLOPramide (REGLAN) 10 MG tablet Place 1 tablet (10 mg total) into feeding tube 4 (four) times daily -  before meals and at bedtime. Via Peg tube 08/12/14  Yes Shanker Kristeen Mans, MD  Multiple Vitamins-Minerals (CENTAMIN) LIQD 5 mLs by PEG Tube route daily.    Yes Historical Provider, MD  NUTRITIONAL SUPPLEMENTS PO Give Two Cal HN at 55cc/hr x 20 hrs via peg ( HOLD 1 HOUR PRE/POST [place])   Yes Historical Provider, MD  olopatadine (PATANOL) 0.1 % ophthalmic solution Place 1 drop into both eyes every morning.   Yes Historical Provider, MD  phenytoin (DILANTIN) 125 MG/5ML suspension Place 4 mLs (100 mg total) into feeding tube 2 (two) times daily. 03/13/14  Yes Shanker Kristeen Mans, MD  polyvinyl alcohol (LIQUIFILM TEARS) 1.4 % ophthalmic solution Place 1 drop into both eyes daily at 6 (six) AM.   Yes Historical Provider, MD  prednisoLONE (PRELONE) 15 MG/5ML SOLN Place into feeding tube. Give 6 mg every morning and 4 mg every evening at 6 pm   Yes  Historical Provider, MD  promethazine (PHENERGAN) 25 MG tablet Place 25 mg into feeding tube every 6 (six) hours as needed for nausea or vomiting.   Yes Historical Provider, MD  ranitidine (ZANTAC) 300 MG tablet Take 300 mg by mouth daily. PEG tube   Yes Historical Provider, MD  Soap & Cleansers (CETAPHIL GENTLE CLEANSER) LIQD Apply to face and neck with water and then pat dry daily.   Yes Historical Provider, MD  Sodium Phosphates (RA SALINE ENEMA) 19-7 GM/118ML ENEM Place 1 each rectally as needed (for constipation).   Yes Historical Provider, MD  Water For Irrigation, Sterile (STERILE WATER FOR IRRIGATION) Flush 227ml of water into peg tub every 4 hours   Yes Historical Provider, MD    Family History Family History  Problem Relation Age of Onset  . Hypothyroidism Mother   . Hypertension Mother   . Diabetes Father     Social History Social History  Substance Use Topics  . Smoking status: Never Smoker  . Smokeless tobacco: Never Used  . Alcohol use No     Allergies  Aspirin; Keflex [cephalexin]; Nsaids; and Penicillins   Review of Systems Review of Systems  Gastrointestinal: Positive for vomiting.  All other systems reviewed and are negative.    Physical Exam Updated Vital Signs BP 112/90 (BP Location: Right Arm)   Pulse 91   Temp (!) 100.6 F (38.1 C) (Rectal)   Resp 18   Ht 6\' 2"  (1.88 m)   Wt 142 lb (64.4 kg)   SpO2 96%   BMI 18.23 kg/m   Physical Exam  Constitutional:  Chronically ill, quadriplegic, slurred speech (baseline)   HENT:  Head: Normocephalic.  MM dry   Eyes: EOM are normal. Pupils are equal, round, and reactive to light.  Neck: Normal range of motion. Neck supple.  Cardiovascular: Normal rate.   Pulmonary/Chest: Effort normal and breath sounds normal. No respiratory distress. He has no wheezes.  Abdominal:  G tube in place, abdomen nontender   Musculoskeletal:  Stage 1 sacral decub, no obvious infection   Neurological: He is alert.    Slurred speech, baseline. Quadriplegic   Skin: Skin is warm.  Psychiatric:  Unable   Nursing note and vitals reviewed.    ED Treatments / Results  Labs (all labs ordered are listed, but only abnormal results are displayed) Labs Reviewed  CBC WITH DIFFERENTIAL/PLATELET - Abnormal; Notable for the following:       Result Value   WBC 11.3 (*)    Hemoglobin 12.2 (*)    HCT 36.9 (*)    MCV 71.9 (*)    MCH 23.8 (*)    Neutro Abs 10.2 (*)    All other components within normal limits  COMPREHENSIVE METABOLIC PANEL - Abnormal; Notable for the following:    Glucose, Bld 107 (*)    BUN 22 (*)    Total Protein 8.3 (*)    All other components within normal limits  PHENYTOIN LEVEL, TOTAL - Abnormal; Notable for the following:    Phenytoin Lvl 4.6 (*)    All other components within normal limits  URINALYSIS, ROUTINE W REFLEX MICROSCOPIC - Abnormal; Notable for the following:    APPearance HAZY (*)    Hgb urine dipstick SMALL (*)    Protein, ur 100 (*)    Leukocytes, UA SMALL (*)    Bacteria, UA RARE (*)    All other components within normal limits  CULTURE, BLOOD (ROUTINE X 2)  CULTURE, BLOOD (ROUTINE X 2)  URINE CULTURE  LIPASE, BLOOD  I-STAT CG4 LACTIC ACID, ED    EKG  EKG Interpretation None       Radiology Dg Abd Acute W/chest  Result Date: 06/26/2016 CLINICAL DATA:  Nausea and vomiting. Parkinson disease. Gastrostomy tube feeding. Quadriplegia. EXAM: DG ABDOMEN ACUTE W/ 1V CHEST COMPARISON:  01/11/2016 chest radiograph. 11/26/2014 CT abdomen/pelvis. FINDINGS: Stable cardiomediastinal silhouette with normal heart size and aortic atherosclerosis. No pneumothorax. No pleural effusion. Slightly low lung volumes. No pulmonary edema. No acute consolidative airspace disease. Percutaneous gastrostomy tube is seen terminating in the left upper quadrant of the abdomen. No disproportionately dilated small bowel loops or significant air-fluid levels . Moderate colorectal stool volume.  No evidence of pneumatosis or pneumoperitoneum. No radiopaque urolithiasis. IMPRESSION: 1. No active disease in the chest . 2. Percutaneous gastrostomy tube terminates in the left upper quadrant of the abdomen. No evidence of free intraperitoneal air. 3. Nonobstructive bowel gas pattern . 4. Moderate colorectal stool volume. Electronically Signed   By: Ilona Sorrel M.D.   On: 06/26/2016 14:57    Procedures Procedures (  including critical care time)  Medications Ordered in ED Medications  fosfomycin (MONUROL) packet 3 g (not administered)  sodium chloride 0.9 % bolus 1,000 mL (1,000 mLs Intravenous New Bag/Given 06/26/16 1521)  ondansetron (ZOFRAN) injection 4 mg (4 mg Intravenous Given 06/26/16 1525)  acetaminophen (TYLENOL) solution 650 mg (650 mg Per Tube Given 06/26/16 1614)     Initial Impression / Assessment and Plan / ED Course  I have reviewed the triage vital signs and the nursing notes.  Pertinent labs & imaging results that were available during my care of the patient were reviewed by me and considered in my medical decision making (see chart for details).     [name] is a 61 y.o. male here with vomiting. Has hx of gastroparesis and is G tube dependent. He is quadriplegic at baseline. Rectal temp 100.6 in the ED. Had previous aspiration pneumonia and UTI in the past. Will get CBC, CMP, lactate, culture, in and out cath for UA, CXR. Will hydrate and reassess.   4:17 PM WBC count 11, baseline. BUN 22, baseline, Cr 0.66. Xray showed no aspiration pneumonia. No vomiting in the ED. In and out cath done. UA showed small leuk and 6-30 WBC. I reviewed previous urine cultures. Previous urine cultures showed E coli with resistance to PCN, bactrim, and cipro. He has keflex allergy. I will try fosfomycin dose here and another dose in 2 days. I think most likely colonization. Repeat urine culture sent. He doesn't appear septic and doesn't need admission for IV abx. Blood cultures sent. Stable  to be transferred back to nursing home.    Final Clinical Impressions(s) / ED Diagnoses   Final diagnoses:  None    New Prescriptions New Prescriptions   No medications on file     Drenda Freeze, MD 06/26/16 [phone]

## 2016-06-26 NOTE — ED Notes (Signed)
Bed: PI95 Expected date:  Expected time:  Means of arrival:  Comments: EMS. Family wants pt evaluated

## 2016-06-26 NOTE — ED Notes (Signed)
[facility] call for patient transportation.

## 2016-06-26 NOTE — ED Notes (Signed)
Try to call [facility] multiple time to give them report regarding going back to facility but nobody answer the phone.

## 2016-06-26 NOTE — ED Triage Notes (Signed)
Pt is from [facility] with c/o nausea and vomiting after geting feeding through G-tube today at the facility. Pt was given nausea medication through G-tube at [facility]  and had relief. Pt came in for farther evaluation. BP 109/60 P-92 RR 20 O2 95% RA .CBG 118 per EMS

## 2016-06-26 NOTE — Discharge Instructions (Signed)
You have a urinary tract infection. You were given fosmycin in the ED. Please give another dose of fosmycin on Tuesday (4/3).   Observe for fevers. If he has persistent fever > 101, return to the ED.   If he is vomiting, continue phenergan as prescribed.   Continue his other current meds.   See your doctor  Return to ER if he has fever > 101 for a week, uncontrolled vomiting, severe abdominal pain, uncontrolled coughing

## 2016-06-26 NOTE — ED Notes (Signed)
In and out cath done and urine sent

## 2016-06-26 NOTE — ED Notes (Signed)
Patient transported to X-ray 

## 2016-06-27 ENCOUNTER — Encounter: Payer: Self-pay | Admitting: Internal Medicine

## 2016-06-27 ENCOUNTER — Non-Acute Institutional Stay (SKILLED_NURSING_FACILITY): Payer: Medicare Other | Admitting: Internal Medicine

## 2016-06-27 DIAGNOSIS — D508 Other iron deficiency anemias: Secondary | ICD-10-CM

## 2016-06-27 DIAGNOSIS — A0472 Enterocolitis due to Clostridium difficile, not specified as recurrent: Secondary | ICD-10-CM | POA: Insufficient documentation

## 2016-06-27 DIAGNOSIS — D638 Anemia in other chronic diseases classified elsewhere: Secondary | ICD-10-CM | POA: Insufficient documentation

## 2016-06-27 DIAGNOSIS — E2740 Unspecified adrenocortical insufficiency: Secondary | ICD-10-CM

## 2016-06-27 DIAGNOSIS — R4701 Aphasia: Secondary | ICD-10-CM | POA: Diagnosis not present

## 2016-06-27 DIAGNOSIS — K219 Gastro-esophageal reflux disease without esophagitis: Secondary | ICD-10-CM | POA: Insufficient documentation

## 2016-06-27 NOTE — Progress Notes (Signed)
Location:  [facility] Room Number: 214D Place of Service:  SNF [phone])  Inocencio Homes, MD  Patient Care Team: [name], MD as PCP - General (Internal Medicine)  Extended Emergency Contact Information Primary Emergency Contact: Hackenberg,[name] Address: [address]          [place], [place] [address] [name] Phone: [phone] Mobile Phone: [phone] Relation: Mother Secondary Emergency Contact: Constance Haw [place] Phone: [phone] Relation: Brother Father: Erik, Burkett [place] Mobile Phone: [phone]    Allergies: Aspirin; Keflex [cephalexin]; Nsaids; and Penicillins  Chief Complaint  Patient presents with  . Medical Management of Chronic Issues    Routine Visit    HPI: Patient is 61 y.o. male who is being seen for routine issues of aphasia, adrenacoticoid deficiency and iron def anemia.  Past Medical History:  Diagnosis Date  . Adrenocortical insufficiency ([place])   . Allergic rhinitis   . Anemia    Iron deficient  . Aphasia 06/19/2013  . Cataract   . Clostridium difficile colitis 02/2014  . Constipation   . Contracture of joint of left hand   . Depression   . Dysphagia 06/19/2013  . GERD (gastroesophageal reflux disease)   . Impaired verbal communication   . Nontraumatic chronic subdural hemorrhage ([place])   . Parkinson's disease ([place])   . Pneumonia 07/2014   aspiration pneumonia  . Pyelonephritis   . Quadriplegia ([place])   . S/P percutaneous endoscopic gastrostomy (PEG) tube placement ([place])   . Seizures ([place])    last occurance 2015  . SIRS (systemic inflammatory response syndrome) ([place]) 08/06/2014  . UTI (urinary tract infection) 07/2014  . Wheelchair bound     Past Surgical History:  Procedure Laterality Date  . COLONOSCOPY    . ESOPHAGOGASTRODUODENOSCOPY    . PEG TUBE PLACEMENT    . PERIPHERALLY INSERTED CENTRAL CATHETER INSERTION    .  TONSILLECTOMY  1962    Allergies as of 06/27/2016      Reactions   Aspirin Other (See Comments)   Unknown reaction; "blood doesn't clog too well" per mother.    Keflex [cephalexin]    Rash   Nsaids Other (See Comments)   Unknown reaction   Penicillins Hives, Other (See Comments)   Unknown reaction.  Pt has tolerated cephalosporins in the past.      Medication List       Accurate as of 06/27/16 11:59 PM. Always use your most recent med list.          acetaminophen 325 MG tablet Commonly known as:  TYLENOL Place 650 mg into feeding tube every 6 (six) hours as needed. Fever > 101   baclofen 10 MG tablet Commonly known as:  LIORESAL Place 1 tablet (10 mg total) into feeding tube 3 (three) times daily.   bisacodyl 10 MG suppository Commonly known as:  DULCOLAX Place 10 mg rectally daily as needed for moderate constipation. If constipation not relieved by milk of magnesia give one 10 mg suppository in 24hours   CENTAMIN Liqd 5 mLs by PEG Tube route daily.   CETAPHIL GENTLE CLEANSER Liqd Apply to face and neck with water and then pat dry daily.   ferrous sulfate 220 (44 Fe) MG/5ML solution Place 330 mg into feeding tube See admin instructions. Give 7.37ml  Per tube every morning   folic acid 1 MG tablet Commonly known as:  FOLVITE 1 mg by PEG Tube route  daily.   loratadine 10 MG tablet Commonly known as:  CLARITIN 10 mg by PEG Tube route daily.   magnesium hydroxide 400 MG/5ML suspension Commonly known as:  MILK OF MAGNESIA Take 30 mLs by mouth daily as needed for mild constipation.   metoCLOPramide 10 MG tablet Commonly known as:  REGLAN Place 1 tablet (10 mg total) into feeding tube 4 (four) times daily -  before meals and at bedtime. Via Peg tube   NUTRITIONAL SUPPLEMENTS PO Give Two Cal HN at 55cc/hr x 20 hrs via peg ( HOLD 1 HOUR PRE/POST [place]ENT[place] ADMINISTRATION)   olopatadine 0.1 % ophthalmic solution Commonly known as:  PATANOL Place 1 drop into both eyes  every morning.   phenytoin 125 MG/5ML suspension Commonly known as:  DILANTIN Place 4 mLs (100 mg total) into feeding tube 2 (two) times daily.   polyvinyl alcohol 1.4 % ophthalmic solution Commonly known as:  LIQUIFILM TEARS Place 1 drop into both eyes daily at 6 (six) AM.   prednisoLONE 15 MG/5ML Soln Commonly known as:  PRELONE Place into feeding tube. Give 6 mg every morning and 4 mg every evening at 6 pm   promethazine 25 MG tablet Commonly known as:  PHENERGAN Place 25 mg into feeding tube every 6 (six) hours as needed for nausea or vomiting.   RA SALINE ENEMA 19-7 GM/118ML Enem Place 1 each rectally as needed (for constipation).   ranitidine 300 MG tablet Commonly known as:  ZANTAC Place 300 mg into feeding tube daily. PEG tube   sterile water for irrigation Flush 221ml of water into peg tub every 4 hours       No orders of the defined types were placed in this encounter.   Immunization History  Administered Date(s) Administered  . Influenza Whole 12/31/2012  . Influenza-Unspecified 12/29/2011, 12/31/2012, 01/07/2014, 12/22/2014, 01/22/2016  . PPD Test 08/22/2008  . Pneumococcal-Unspecified 06/23/2010, 06/23/2015    Social History  Substance Use Topics  . Smoking status: Never Smoker  . Smokeless tobacco: Never Used  . Alcohol use No    Review of Systems  DATA OBTAINED: from nurse GENERAL:  no fevers, fatigue, appetite changes SKIN: No itching, rash HEENT: No complaint RESPIRATORY: No cough, wheezing, SOB CARDIAC: No chest pain, palpitations, lower extremity edema  GI: No abdominal pain, No N/V/D or constipation, No heartburn or reflux  GU: No dysuria, frequency or urgency, or incontinence  MUSCULOSKELETAL: No unrelieved bone/joint pain NEUROLOGIC: No headache, dizziness  PSYCHIATRIC: No overt anxiety or sadness  Vitals:   06/27/16 1201  BP: 118/73  Pulse: 67  Resp: 18  Temp: 97.1 F (36.2 C)   Body mass index is 18.36 kg/m. Physical  Exam  GENERAL APPEARANCE: Alert, non conversant, No acute distress  SKIN: No diaphoresis rash HEENT: Unremarkable RESPIRATORY: Breathing is even, unlabored. Lung sounds are clear   CARDIOVASCULAR: Heart RRR no murmurs, rubs or gallops. No peripheral edema  GASTROINTESTINAL: Abdomen is soft, non-tender, not distended w/ normal bowel sounds.  GENITOURINARY: Bladder non tender, not distended  MUSCULOSKELETAL: wasting and contractures NEUROLOGIC: Cranial nerves 2-12 grossly intact; quadriplegia with some small movement of RUE PSYCHIATRIC: Mood and affect appropriate to situation, no behavioral issues  Patient Active Problem List   Diagnosis Date Noted  . Anemia   . GERD (gastroesophageal reflux disease)   . Clostridium difficile colitis   . Adrenocortical insufficiency ([place])   . Cataract 06/13/2016  . Constipation 06/13/2016  . Contracture of joint of left hand 06/13/2016  . Impaired verbal  communication 06/13/2016  . Nontraumatic chronic subdural hemorrhage ([place]) 06/13/2016  . Nonverbal 06/13/2016  . Wheelchair bound 06/13/2016  . Allergic rhinitis 06/13/2016  . S/P percutaneous endoscopic gastrostomy (PEG) tube placement ([place]) 06/13/2016  . Itchy eyes 11/12/2015  . Benign neoplasm of colon 05/22/2015  . History of colon polyps 05/22/2015  . Lung nodule 05/22/2015  . Abnormal levels of other serum enzymes 05/22/2015  . Gastroesophageal reflux disease 03/17/2015  . Tinea cruris 09/10/2014  . Aspiration pneumonia ([place]) 08/15/2014  . Abscess, prostate 08/15/2014  . Pyelonephritis 08/06/2014  . Nausea & vomiting   . Fever presenting with conditions classified elsewhere 03/16/2014  . Altered mental status 03/16/2014  . Acute bronchiolitis due to unspecified organism 03/11/2014  . Enteritis due to Clostridium difficile 03/11/2014  . Type 2 diabetes mellitus without complication ([place]) 81/27/5170  . Septic shock ([place]) 01/26/2014  . Sepsis ([place]) 01/25/2014  . Acute encephalopathy  01/02/2014  . Acute respiratory failure with hypercapnia ([place]) 01/02/2014  . Chronic anemia 01/02/2014  . Subdural hematoma ([place]) 01/02/2014  . Pain in thoracic spine 12/26/2013  . PEG (percutaneous endoscopic gastrostomy) status ([place]) 10/04/2013  . Rash and nonspecific skin eruption 07/08/2013  . Thrush 07/08/2013  . Heme positive stool 07/07/2013  . Severe sepsis ([place]) 06/23/2013  . E. coli pyelonephritis 06/22/2013  . SIRS (systemic inflammatory response syndrome) ([place]) 06/19/2013  . Leukocytosis 06/19/2013  . Bronchitis 06/19/2013  . Dysphagia 06/19/2013  . Aphasia 06/19/2013  . Other convulsions 08/10/2012  . Iron deficiency anemia 08/10/2012  . Glucocorticoid deficiency ([place]) 08/10/2012  . Febrile illness, acute 06/15/2012  . UTI (lower urinary tract infection) 06/15/2012  . Hypokalemia 11/10/2011  . Diabetic gastroparesis ([place]) 11/08/2011  . Ileus ([place]) 11/08/2011  . Hyponatremia 11/08/2011  . Adrenal insufficiency ([place]) 11/08/2011  . Quadriplegia ([place]) 11/08/2011  . Parkinson disease ([place])   . Seizures ([place])   . Depression     CMP     Component Value Date/Time   NA 141 06/26/2016 1424   NA 141 06/26/2016   K 3.9 06/26/2016 1424   CL 108 06/26/2016 1424   CO2 26 06/26/2016 1424   GLUCOSE 107 (H) 06/26/2016 1424   BUN 22 (H) 06/26/2016 1424   BUN 22 (A) 06/26/2016   CREATININE 0.66 06/26/2016 1424   CALCIUM 9.1 06/26/2016 1424   PROT 8.3 (H) 06/26/2016 1424   ALBUMIN 3.6 06/26/2016 1424   AST 29 06/26/2016 1424   ALT 20 06/26/2016 1424   ALKPHOS 105 06/26/2016 1424   BILITOT 0.5 06/26/2016 1424   GFRNONAA >60 06/26/2016 1424   GFRAA >60 06/26/2016 1424    Recent Labs  01/11/16 0353 01/25/16 03/26/16 06/26/16 06/26/16 1424  NA 131* 134* 135* 141 141  K 3.7 4.4 4.0  --  3.9  CL 99*  --   --   --  108  CO2 25  --   --   --  26  GLUCOSE 90  --   --   --  107*  BUN 17 18 23* 22* 22*  CREATININE 0.52* 0.5* 0.5* 0.7 0.66  CALCIUM 8.3*  --   --   --   9.1    Recent Labs  01/11/16 0353 01/25/16 03/26/16 06/26/16 1424  AST 37 26 28 29   ALT 27 17 18 20   ALKPHOS 114 98 129* 105  BILITOT 0.3  --   --  0.5  PROT 7.7  --   --  8.3*  ALBUMIN 3.3*  --   --  3.6    Recent Labs  01/11/16 0353 01/27/16 03/26/16 06/26/16 06/26/16 1424  WBC 8.7 6.8 6.6 11.3 11.3*  NEUTROABS 5.8  --   --   --  10.2*  HGB 11.0* 10.7* 12.1*  --  12.2*  HCT 32.9* 35* 39*  --  36.9*  MCV 69.6*  --   --   --  71.9*  PLT 192 373 247  --  183    Recent Labs  08/10/15 01/25/16 01/26/16  CHOL 157 138 136  LDLCALC 76 62 62  TRIG 75 60 61   Lab Results  Component Value Date   MICROALBUR 29.1 10/09/2015   Lab Results  Component Value Date   TSH 1.23 01/26/2016   Lab Results  Component Value Date   HGBA1C 5.0 01/27/2016   Lab Results  Component Value Date   CHOL 136 01/26/2016   HDL 62 01/26/2016   LDLCALC 62 01/26/2016   TRIG 61 01/26/2016    Significant Diagnostic Results in last 30 days:  Dg Abd Acute W/chest  Result Date: 06/26/2016 CLINICAL DATA:  Nausea and vomiting. Parkinson disease. Gastrostomy tube feeding. Quadriplegia. EXAM: DG ABDOMEN ACUTE W/ 1V CHEST COMPARISON:  01/11/2016 chest radiograph. 11/26/2014 CT abdomen/pelvis. FINDINGS: Stable cardiomediastinal silhouette with normal heart size and aortic atherosclerosis. No pneumothorax. No pleural effusion. Slightly low lung volumes. No pulmonary edema. No acute consolidative airspace disease. Percutaneous gastrostomy tube is seen terminating in the left upper quadrant of the abdomen. No disproportionately dilated small bowel loops or significant air-fluid levels . Moderate colorectal stool volume. No evidence of pneumatosis or pneumoperitoneum. No radiopaque urolithiasis. IMPRESSION: 1. No active disease in the chest . 2. Percutaneous gastrostomy tube terminates in the left upper quadrant of the abdomen. No evidence of free intraperitoneal air. 3. Nonobstructive bowel gas pattern . 4.  Moderate colorectal stool volume. Electronically Signed   By: Ilona Sorrel M.D.   On: 06/26/2016 14:57    Assessment and Plan  Aphasia Chronic and stable ; pt can communicate with gestures and has written sentences he can point to; cont supportive care  Adrenocortical insufficiency (HCC) Chronic and stable;cont prednisone 6 mg PT qam and 4 mg PT q pm  Anemia Last  Hb 12.2 mg, stable from prior;plan to cont iron 330 mg PT daily and folate 1 mg daily    Eean Buss D. [name], MD

## 2016-06-29 LAB — URINE CULTURE

## 2016-06-30 ENCOUNTER — Telehealth: Payer: Self-pay | Admitting: Emergency Medicine

## 2016-06-30 NOTE — Telephone Encounter (Signed)
Post ED Visit - Positive Culture Follow-up  Culture report reviewed by antimicrobial stewardship pharmacist:  []  Elenor Quinones, Pharm.D. []  Heide Guile, Pharm.D., BCPS AQ-ID []  Parks Neptune, Pharm.D., BCPS []  Alycia Rossetti, Pharm.D., BCPS []  [place], Pharm.D., BCPS, AAHIVP []  Legrand Como, Pharm.D., BCPS, AAHIVP []  Salome Arnt, PharmD, BCPS [x]  Dimitri Ped, PharmD, BCPS []  Vincenza Hews, PharmD, BCPS  Positive urine culture Treated with fosfomycin, patient is nonverbal in long term nursing facility, report faxed to [facility] [place], [place] 06/30/2016, 1:11 PM

## 2016-07-01 LAB — CULTURE, BLOOD (ROUTINE X 2): Culture: NO GROWTH

## 2016-07-02 LAB — CULTURE, BLOOD (ROUTINE X 2)
Culture: NO GROWTH
Special Requests: ADEQUATE

## 2016-07-23 ENCOUNTER — Encounter: Payer: Self-pay | Admitting: Internal Medicine

## 2016-07-23 NOTE — Assessment & Plan Note (Signed)
Chronic and stable ; pt can communicate with gestures and has written sentences he can point to; cont supportive care

## 2016-07-23 NOTE — Assessment & Plan Note (Signed)
Chronic and stable;cont prednisone 6 mg PT qam and 4 mg PT q pm

## 2016-07-23 NOTE — Assessment & Plan Note (Signed)
Last  Hb 12.2 mg, stable from prior;plan to cont iron 330 mg PT daily and folate 1 mg daily

## 2016-07-26 ENCOUNTER — Encounter: Payer: Self-pay | Admitting: Internal Medicine

## 2016-07-26 ENCOUNTER — Non-Acute Institutional Stay (SKILLED_NURSING_FACILITY): Payer: Medicare Other | Admitting: Internal Medicine

## 2016-07-26 DIAGNOSIS — G8250 Quadriplegia, unspecified: Secondary | ICD-10-CM | POA: Diagnosis not present

## 2016-07-26 DIAGNOSIS — E119 Type 2 diabetes mellitus without complications: Secondary | ICD-10-CM

## 2016-07-26 DIAGNOSIS — D508 Other iron deficiency anemias: Secondary | ICD-10-CM | POA: Diagnosis not present

## 2016-07-26 NOTE — Progress Notes (Signed)
Location:  [facility] Room Number: 214D Place of Service:  SNF (31)  [name], MD  Patient Care Team: [name], MD as PCP - General (Internal Medicine)  Extended Emergency Contact Information Primary Emergency Contact: Fellner,[name] Address: [address]          [place], [place] [address] [name] Phone: [phone] Mobile Phone: [phone] Relation: Mother Secondary Emergency Contact: Constance Haw [place] Phone: [phone] Relation: Brother Father: Donny, [place] Mobile Phone: [phone]    Allergies: Aspirin; Keflex [cephalexin]; Nsaids; and Penicillins  Chief Complaint  Patient presents with  . Medical Management of Chronic Issues    Routine Visit    HPI: Patient is 61 y.o. male who id being seen for routine issues of anemia, quadriplegia and DM2.  Past Medical History:  Diagnosis Date  . Adrenocortical insufficiency ([place])   . Allergic rhinitis   . Anemia    Iron deficient  . Aphasia 06/19/2013  . Cataract   . Clostridium difficile colitis 02/2014  . Constipation   . Contracture of joint of left hand   . Depression   . Dysphagia 06/19/2013  . GERD (gastroesophageal reflux disease)   . Impaired verbal communication   . Nontraumatic chronic subdural hemorrhage ([place])   . Parkinson's disease ([place])   . Pneumonia 07/2014   aspiration pneumonia  . Pyelonephritis   . Quadriplegia ([place])   . S/P percutaneous endoscopic gastrostomy (PEG) tube placement ([place])   . Seizures ([place])    last occurance 2015  . SIRS (systemic inflammatory response syndrome) ([place]) 08/06/2014  . UTI (urinary tract infection) 07/2014  . Wheelchair bound     Past Surgical History:  Procedure Laterality Date  . COLONOSCOPY    . ESOPHAGOGASTRODUODENOSCOPY    . PEG TUBE PLACEMENT    . PERIPHERALLY INSERTED CENTRAL CATHETER INSERTION    . TONSILLECTOMY  1962     Allergies as of 07/26/2016      Reactions   Aspirin Other (See Comments)   Unknown reaction; "blood doesn't clog too well" per mother.    Keflex [cephalexin]    Rash   Nsaids Other (See Comments)   Unknown reaction   Penicillins Hives, Other (See Comments)   Unknown reaction.  Pt has tolerated cephalosporins in the past.      Medication List       Accurate as of 07/26/16 11:59 PM. Always use your most recent med list.          acetaminophen 325 MG tablet Commonly known as:  TYLENOL Place 650 mg into feeding tube every 6 (six) hours as needed. Fever > 101   baclofen 10 MG tablet Commonly known as:  LIORESAL Place 1 tablet (10 mg total) into feeding tube 3 (three) times daily.   bisacodyl 10 MG suppository Commonly known as:  DULCOLAX Place 10 mg rectally daily as needed for moderate constipation. If constipation not relieved by milk of magnesia give one 10 mg suppository in 24hours   CENTAMIN Liqd 5 mLs by PEG Tube route daily.   CETAPHIL GENTLE CLEANSER Liqd Apply to face and neck with water and then pat dry daily.   feeding supplement (JEVITY 1.5 CAL) Liqd Place into feeding tube. Initiate Jevity 1.5 cal @@ 58 ml/hr continuous x 20 hr via peg ( hold 1 hr pre/post phenton administration)   ferrous sulfate 220 (44 Fe) MG/5ML solution Place 330 mg into  feeding tube See admin instructions. Give 7.33ml  Per tube every morning   folic acid 1 MG tablet Commonly known as:  FOLVITE 1 mg by PEG Tube route daily.   loratadine 10 MG tablet Commonly known as:  CLARITIN 10 mg by PEG Tube route daily.   magnesium hydroxide 400 MG/5ML suspension Commonly known as:  MILK OF MAGNESIA Take 30 mLs by mouth daily as needed for mild constipation.   metoCLOPramide 10 MG tablet Commonly known as:  REGLAN Place 1 tablet (10 mg total) into feeding tube 4 (four) times daily -  before meals and at bedtime. Via Peg tube   olopatadine 0.1 % ophthalmic solution Commonly known as:   PATANOL Place 1 drop into both eyes every morning.   phenytoin 125 MG/5ML suspension Commonly known as:  DILANTIN Place 4 mLs (100 mg total) into feeding tube 2 (two) times daily.   polyvinyl alcohol 1.4 % ophthalmic solution Commonly known as:  LIQUIFILM TEARS Place 1 drop into both eyes daily at 6 (six) AM.   prednisoLONE 15 MG/5ML Soln Commonly known as:  PRELONE Place into feeding tube. Give 6 mg every morning and 4 mg every evening at 6 pm   promethazine 25 MG tablet Commonly known as:  PHENERGAN Place 25 mg into feeding tube every 6 (six) hours as needed for nausea or vomiting.   RA SALINE ENEMA 19-7 GM/118ML Enem Place 1 each rectally as needed (for constipation).   ranitidine 300 MG tablet Commonly known as:  ZANTAC Place 300 mg into feeding tube daily. PEG tube   sterile water for irrigation Flush 268ml of water into peg tub every 4 hours       Meds ordered this encounter  Medications  . Nutritional Supplements (FEEDING SUPPLEMENT, JEVITY 1.5 CAL,) LIQD    Sig: Place into feeding tube. Initiate Jevity 1.5 cal @@ 58 ml/hr continuous x 20 hr via peg ( hold 1 hr pre/post phenton administration)    Immunization History  Administered Date(s) Administered  . Influenza Whole 12/31/2012  . Influenza-Unspecified 12/29/2011, 12/31/2012, 01/07/2014, 12/22/2014, 01/22/2016  . PPD Test 08/22/2008  . Pneumococcal-Unspecified 06/23/2010, 06/23/2015    Social History  Substance Use Topics  . Smoking status: Never Smoker  . Smokeless tobacco: Never Used  . Alcohol use No    Review of Systems  DATA OBTAINED: from nurse GENERAL:  no fevers, fatigue, appetite changes SKIN: No itching, rash HEENT: No complaint RESPIRATORY: No cough, wheezing, SOB CARDIAC: No chest pain, palpitations, lower extremity edema  GI: No abdominal pain, No N/V/D or constipation, No heartburn or reflux  GU: No dysuria, frequency or urgency, or incontinence  MUSCULOSKELETAL: No unrelieved  bone/joint pain NEUROLOGIC: No headache, dizziness  PSYCHIATRIC: No overt anxiety or sadness  Vitals:   07/26/16 1151  BP: 118/73  Pulse: 76  Resp: 18  Temp: 97.1 F (36.2 C)   Body mass index is 18.13 kg/m. Physical Exam  GENERAL APPEARANCE: Alert,non  conversant, No acute distress  SKIN: No diaphoresis rash HEENT: Unremarkable RESPIRATORY: Breathing is even, unlabored. Lung sounds are clear   CARDIOVASCULAR: Heart RRR no murmurs, rubs or gallops. No peripheral edema  GASTROINTESTINAL: Abdomen is soft, non-tender, not distended w/ normal bowel sounds.  GENITOURINARY: Bladder non tender, not distended  MUSCULOSKELETAL: No abnormal joints or musculature NEUROLOGIC: Cranial nerves 2-12 grossly intact; quadriplegia with some movements of UE PSYCHIATRIC: Mood and affect appropriate to situation, no behavioral issues  Patient Active Problem List   Diagnosis Date Noted  .  Anemia   . GERD (gastroesophageal reflux disease)   . Clostridium difficile colitis   . Adrenocortical insufficiency ([place])   . Cataract 06/13/2016  . Constipation 06/13/2016  . Contracture of joint of left hand 06/13/2016  . Impaired verbal communication 06/13/2016  . Nontraumatic chronic subdural hemorrhage ([place]) 06/13/2016  . Nonverbal 06/13/2016  . Wheelchair bound 06/13/2016  . Allergic rhinitis 06/13/2016  . S/P percutaneous endoscopic gastrostomy (PEG) tube placement ([place]) 06/13/2016  . Itchy eyes 11/12/2015  . Benign neoplasm of colon 05/22/2015  . History of colon polyps 05/22/2015  . Lung nodule 05/22/2015  . Abnormal levels of other serum enzymes 05/22/2015  . Gastroesophageal reflux disease 03/17/2015  . Tinea cruris 09/10/2014  . Aspiration pneumonia ([place]) 08/15/2014  . Abscess, prostate 08/15/2014  . Pyelonephritis 08/06/2014  . Nausea & vomiting   . Fever presenting with conditions classified elsewhere 03/16/2014  . Altered mental status 03/16/2014  . Acute bronchiolitis due to  unspecified organism 03/11/2014  . Enteritis due to Clostridium difficile 03/11/2014  . Type 2 diabetes mellitus without complication ([place]) 01/11/5101  . Septic shock ([place]) 01/26/2014  . Sepsis ([place]) 01/25/2014  . Acute encephalopathy 01/02/2014  . Acute respiratory failure with hypercapnia ([place]) 01/02/2014  . Chronic anemia 01/02/2014  . Subdural hematoma ([place]) 01/02/2014  . Pain in thoracic spine 12/26/2013  . PEG (percutaneous endoscopic gastrostomy) status ([place]) 10/04/2013  . Rash and nonspecific skin eruption 07/08/2013  . Thrush 07/08/2013  . Heme positive stool 07/07/2013  . Severe sepsis ([place]) 06/23/2013  . E. coli pyelonephritis 06/22/2013  . SIRS (systemic inflammatory response syndrome) ([place]) 06/19/2013  . Leukocytosis 06/19/2013  . Bronchitis 06/19/2013  . Dysphagia 06/19/2013  . Aphasia 06/19/2013  . Other convulsions 08/10/2012  . Iron deficiency anemia 08/10/2012  . Glucocorticoid deficiency ([place]) 08/10/2012  . Febrile illness, acute 06/15/2012  . UTI (lower urinary tract infection) 06/15/2012  . Hypokalemia 11/10/2011  . Diabetic gastroparesis ([place]) 11/08/2011  . Ileus ([place]) 11/08/2011  . Hyponatremia 11/08/2011  . Adrenal insufficiency ([place]) 11/08/2011  . Quadriplegia ([place]) 11/08/2011  . Parkinson disease ([place])   . Seizures ([place])   . Depression     CMP     Component Value Date/Time   NA 141 06/26/2016 1424   NA 141 06/26/2016   K 3.9 06/26/2016 1424   CL 108 06/26/2016 1424   CO2 26 06/26/2016 1424   GLUCOSE 107 (H) 06/26/2016 1424   BUN 22 (H) 06/26/2016 1424   BUN 22 (A) 06/26/2016   CREATININE 0.66 06/26/2016 1424   CALCIUM 9.1 06/26/2016 1424   PROT 8.3 (H) 06/26/2016 1424   ALBUMIN 3.6 06/26/2016 1424   AST 29 06/26/2016 1424   ALT 20 06/26/2016 1424   ALKPHOS 105 06/26/2016 1424   BILITOT 0.5 06/26/2016 1424   GFRNONAA >60 06/26/2016 1424   GFRAA >60 06/26/2016 1424    Recent Labs  01/11/16 0353  01/25/16 03/26/16 06/26/16  06/26/16 1424  NA 131*  < > 134* 135* 141 141  K 3.7  --  4.4 4.0  --  3.9  CL 99*  --   --   --   --  108  CO2 25  --   --   --   --  26  GLUCOSE 90  --   --   --   --  107*  BUN 17  < > 18 23* 22* 22*  CREATININE 0.52*  < > 0.5* 0.5* 0.7 0.66  CALCIUM 8.3*  --   --   --   --  9.1  < > = values in this interval not displayed.  Recent Labs  01/11/16 0353 01/25/16 03/26/16 06/26/16 1424  AST 37 26 28 29   ALT 27 17 18 20   ALKPHOS 114 98 129* 105  BILITOT 0.3  --   --  0.5  PROT 7.7  --   --  8.3*  ALBUMIN 3.3*  --   --  3.6    Recent Labs  01/11/16 0353  01/27/16 03/26/16 06/26/16 06/26/16 1424  WBC 8.7  < > 6.8 6.6 11.3 11.3*  NEUTROABS 5.8  --   --   --   --  10.2*  HGB 11.0*  --  10.7* 12.1*  --  12.2*  HCT 32.9*  --  35* 39*  --  36.9*  MCV 69.6*  --   --   --   --  71.9*  PLT 192  --  373 247  --  183  < > = values in this interval not displayed.  Recent Labs  01/25/16 01/26/16  CHOL 138 136  LDLCALC 62 62  TRIG 60 61   Lab Results  Component Value Date   MICROALBUR 29.1 10/09/2015   Lab Results  Component Value Date   TSH 1.23 01/26/2016   Lab Results  Component Value Date   HGBA1C 5.0 01/27/2016   Lab Results  Component Value Date   CHOL 136 01/26/2016   HDL 62 01/26/2016   LDLCALC 62 01/26/2016   TRIG 61 01/26/2016    Significant Diagnostic Results in last 30 days:  No results found.  Assessment and Plan  Iron deficiency anemia Hb is 11.3, down from prior;plan to cont iron 330 mg PT anf folate 1 mg Pt dai;y  Quadriplegia Chronic and stable; some movement of BUE; plan to cont baclofen 10 mg PT TID  Type 2 diabetes mellitus without complication J2E 5.0; not on meds, diet controlled; pt not on ACE or statin    [name], MD

## 2016-08-01 DIAGNOSIS — R488 Other symbolic dysfunctions: Secondary | ICD-10-CM | POA: Diagnosis not present

## 2016-08-01 DIAGNOSIS — G20 Parkinson's disease: Secondary | ICD-10-CM | POA: Diagnosis not present

## 2016-08-21 ENCOUNTER — Encounter: Payer: Self-pay | Admitting: Internal Medicine

## 2016-08-21 NOTE — Assessment & Plan Note (Signed)
Hb is 11.3, down from prior;plan to cont iron 330 mg PT anf folate 1 mg Pt dai;y

## 2016-08-21 NOTE — Assessment & Plan Note (Signed)
Chronic and stable; some movement of BUE; plan to cont baclofen 10 mg PT TID

## 2016-08-22 NOTE — Assessment & Plan Note (Signed)
A1c 5.0; not on meds, diet controlled; pt not on ACE or statin

## 2016-08-29 ENCOUNTER — Encounter: Payer: Self-pay | Admitting: Internal Medicine

## 2016-08-29 ENCOUNTER — Non-Acute Institutional Stay (SKILLED_NURSING_FACILITY): Payer: Medicare Other | Admitting: Internal Medicine

## 2016-08-29 DIAGNOSIS — E1143 Type 2 diabetes mellitus with diabetic autonomic (poly)neuropathy: Secondary | ICD-10-CM | POA: Diagnosis not present

## 2016-08-29 DIAGNOSIS — R569 Unspecified convulsions: Secondary | ICD-10-CM | POA: Diagnosis not present

## 2016-08-29 DIAGNOSIS — K3184 Gastroparesis: Secondary | ICD-10-CM | POA: Diagnosis not present

## 2016-08-29 DIAGNOSIS — R1310 Dysphagia, unspecified: Secondary | ICD-10-CM | POA: Diagnosis not present

## 2016-08-29 NOTE — Progress Notes (Signed)
Location:  [facility] Room Number: 214D Place of Service:  SNF (31)  [name], MD  Patient Care Team: [name], MD as PCP - General (Internal Medicine)  Extended Emergency Contact Information Primary Emergency Contact: Bristow,[name] Address: [address]          [place], [place] [address] [name] Phone: [phone] Mobile Phone: [phone] Relation: Mother Secondary Emergency Contact: Constance Haw [place] Phone: [phone] Relation: Brother Father: Kevin, [place] Mobile Phone: [phone]    Allergies: Aspirin; Keflex [cephalexin]; Nsaids; and Penicillins  Chief Complaint  Patient presents with  . Medical Management of Chronic Issues    Routine Visit    HPI: Patient is 61 y.o. male who Is being seen for routine issues of seizures, diabetic gastroparesis, and dysphasia.  Past Medical History:  Diagnosis Date  . Adrenocortical insufficiency ([place])   . Allergic rhinitis   . Anemia    Iron deficient  . Aphasia 06/19/2013  . Cataract   . Clostridium difficile colitis 02/2014  . Constipation   . Contracture of joint of left hand   . Depression   . Dysphagia 06/19/2013  . GERD (gastroesophageal reflux disease)   . Impaired verbal communication   . Nontraumatic chronic subdural hemorrhage ([place])   . Parkinson's disease ([place])   . Pneumonia 07/2014   aspiration pneumonia  . Pyelonephritis   . Quadriplegia ([place])   . S/P percutaneous endoscopic gastrostomy (PEG) tube placement ([place])   . Seizures ([place])    last occurance 2015  . SIRS (systemic inflammatory response syndrome) ([place]) 08/06/2014  . Type 2 diabetes mellitus without complication ([place]) 22/97/9892  . UTI (urinary tract infection) 07/2014  . Wheelchair bound     Past Surgical History:  Procedure Laterality Date  . COLONOSCOPY    . ESOPHAGOGASTRODUODENOSCOPY    . PEG TUBE  PLACEMENT    . PERIPHERALLY INSERTED CENTRAL CATHETER INSERTION    . TONSILLECTOMY  1962    Allergies as of 08/29/2016      Reactions   Aspirin Other (See Comments)   Unknown reaction; "blood doesn't clog too well" per mother.    Keflex [cephalexin]    Rash   Nsaids Other (See Comments)   Unknown reaction   Penicillins Hives, Other (See Comments)   Unknown reaction.  Pt has tolerated cephalosporins in the past.      Medication List       Accurate as of 08/29/16 11:59 PM. Always use your most recent med list.          acetaminophen 325 MG tablet Commonly known as:  TYLENOL Place 650 mg into feeding tube every 6 (six) hours as needed. Fever > 101   baclofen 10 MG tablet Commonly known as:  LIORESAL Place 1 tablet (10 mg total) into feeding tube 3 (three) times daily.   bisacodyl 10 MG suppository Commonly known as:  DULCOLAX Place 10 mg rectally daily as needed for moderate constipation. If constipation not relieved by milk of magnesia give one 10 mg suppository in 24hours   CENTAMIN Liqd 5 mLs by PEG Tube route daily.   CETAPHIL GENTLE CLEANSER Liqd Apply to face and neck with water and then pat dry daily.   feeding supplement (JEVITY 1.5 CAL) Liqd Place into feeding tube. Initiate Jevity 1.5 cal @@ 58 ml/hr continuous x 20 hr via peg ( hold 1 hr pre/post phenton administration)  ferrous sulfate 220 (44 Fe) MG/5ML solution Place 330 mg into feeding tube See admin instructions. Give 7.86ml  Per tube every morning   folic acid 1 MG tablet Commonly known as:  FOLVITE 1 mg by PEG Tube route daily.   loratadine 10 MG tablet Commonly known as:  CLARITIN 10 mg by PEG Tube route daily.   magnesium hydroxide 400 MG/5ML suspension Commonly known as:  MILK OF MAGNESIA Take 30 mLs by mouth daily as needed for mild constipation.   metoCLOPramide 10 MG tablet Commonly known as:  REGLAN Place 1 tablet (10 mg total) into feeding tube 4 (four) times daily -  before meals and  at bedtime. Via Peg tube   olopatadine 0.1 % ophthalmic solution Commonly known as:  PATANOL Place 1 drop into both eyes every morning.   phenytoin 125 MG/5ML suspension Commonly known as:  DILANTIN Place 4 mLs (100 mg total) into feeding tube 2 (two) times daily.   polyvinyl alcohol 1.4 % ophthalmic solution Commonly known as:  LIQUIFILM TEARS Place 1 drop into both eyes daily at 6 (six) AM.   prednisoLONE 15 MG/5ML Soln Commonly known as:  PRELONE Place into feeding tube. Give 6 mg every morning and 4 mg every evening at 6 pm   promethazine 25 MG tablet Commonly known as:  PHENERGAN Place 25 mg into feeding tube every 6 (six) hours as needed for nausea or vomiting.   RA SALINE ENEMA 19-7 GM/118ML Enem Place 1 each rectally as needed (for constipation).   ranitidine 300 MG tablet Commonly known as:  ZANTAC Place 300 mg into feeding tube daily. PEG tube   sterile water for irrigation Flush 255ml of water into peg tub every 4 hours       No orders of the defined types were placed in this encounter.   Immunization History  Administered Date(s) Administered  . Influenza Whole 12/31/2012  . Influenza-Unspecified 12/29/2011, 12/31/2012, 01/07/2014, 12/22/2014, 01/22/2016  . PPD Test 08/22/2008  . Pneumococcal-Unspecified 06/23/2010, 06/23/2015    Social History  Substance Use Topics  . Smoking status: Never Smoker  . Smokeless tobacco: Never Used  . Alcohol use No    Review of Systems  DATA OBTAINED: from Nurse GENERAL:  no fevers, fatigue, appetite changes SKIN: No itching, rash HEENT: No complaint RESPIRATORY: No cough, wheezing, SOB CARDIAC: No chest pain, palpitations, lower extremity edema  GI: No abdominal pain, No N/V/D or constipation, No heartburn or reflux  GU: No dysuria, frequency or urgency, or incontinence  MUSCULOSKELETAL: No unrelieved bone/joint pain NEUROLOGIC: No headache, dizziness  PSYCHIATRIC: No overt anxiety or sadness  Vitals:    08/29/16 1029  BP: 118/73  Pulse: 76  Resp: 16  Temp: 97.1 F (36.2 C)   Body mass index is 21.12 kg/m. Physical Exam  GENERAL APPEARANCE: Alert, Non-conversant, No acute distress  SKIN: No diaphoresis rash HEENT: Unremarkable RESPIRATORY: Breathing is even, unlabored. Lung sounds are clear   CARDIOVASCULAR: Heart RRR no murmurs, rubs or gallops. No peripheral edema  GASTROINTESTINAL: Abdomen is soft, non-tender, not distended w/ normal bowel sounds.  GENITOURINARY: Bladder non tender, not distended  MUSCULOSKELETAL: Wasting and contractures NEUROLOGIC: Cranial nerves 2-12 grossly intact; quadriplegia with small movements of right upper extremity PSYCHIATRIC: Mood and affect appropriate to situation, no behavioral issues  Patient Active Problem List   Diagnosis Date Noted  . Anemia   . GERD (gastroesophageal reflux disease)   . Clostridium difficile colitis   . Adrenocortical insufficiency ([place])   . Cataract  06/13/2016  . Constipation 06/13/2016  . Contracture of joint of left hand 06/13/2016  . Impaired verbal communication 06/13/2016  . Nontraumatic chronic subdural hemorrhage ([place]) 06/13/2016  . Nonverbal 06/13/2016  . Wheelchair bound 06/13/2016  . Allergic rhinitis 06/13/2016  . S/P percutaneous endoscopic gastrostomy (PEG) tube placement ([place]) 06/13/2016  . Itchy eyes 11/12/2015  . Benign neoplasm of colon 05/22/2015  . History of colon polyps 05/22/2015  . Lung nodule 05/22/2015  . Abnormal levels of other serum enzymes 05/22/2015  . Gastroesophageal reflux disease 03/17/2015  . Tinea cruris 09/10/2014  . Aspiration pneumonia ([place]) 08/15/2014  . Abscess, prostate 08/15/2014  . Pyelonephritis 08/06/2014  . Nausea & vomiting   . Fever presenting with conditions classified elsewhere 03/16/2014  . Altered mental status 03/16/2014  . Acute bronchiolitis due to unspecified organism 03/11/2014  . Enteritis due to Clostridium difficile 03/11/2014  . Type 2 diabetes  mellitus without complication ([place]) 01/75/1025  . Septic shock ([place]) 01/26/2014  . Sepsis ([place]) 01/25/2014  . Acute encephalopathy 01/02/2014  . Acute respiratory failure with hypercapnia ([place]) 01/02/2014  . Chronic anemia 01/02/2014  . Subdural hematoma ([place]) 01/02/2014  . Pain in thoracic spine 12/26/2013  . PEG (percutaneous endoscopic gastrostomy) status ([place]) 10/04/2013  . Rash and nonspecific skin eruption 07/08/2013  . Thrush 07/08/2013  . Heme positive stool 07/07/2013  . Severe sepsis ([place]) 06/23/2013  . E. coli pyelonephritis 06/22/2013  . SIRS (systemic inflammatory response syndrome) ([place]) 06/19/2013  . Leukocytosis 06/19/2013  . Bronchitis 06/19/2013  . Dysphagia 06/19/2013  . Aphasia 06/19/2013  . Other convulsions 08/10/2012  . Iron deficiency anemia 08/10/2012  . Glucocorticoid deficiency ([place]) 08/10/2012  . Febrile illness, acute 06/15/2012  . UTI (lower urinary tract infection) 06/15/2012  . Hypokalemia 11/10/2011  . Diabetic gastroparesis ([place]) 11/08/2011  . Ileus ([place]) 11/08/2011  . Hyponatremia 11/08/2011  . Adrenal insufficiency ([place]) 11/08/2011  . Quadriplegia ([place]) 11/08/2011  . Parkinson disease ([place])   . Seizures ([place])   . Depression     CMP     Component Value Date/Time   NA 134 (A) 09/13/2016   K 3.4 09/13/2016   CL 108 06/26/2016 1424   CO2 26 06/26/2016 1424   GLUCOSE 107 (H) 06/26/2016 1424   BUN 11 09/13/2016   CREATININE 0.5 (A) 09/13/2016   CREATININE 0.66 06/26/2016 1424   CALCIUM 9.1 06/26/2016 1424   PROT 8.3 (H) 06/26/2016 1424   ALBUMIN 3.6 06/26/2016 1424   AST 19 09/13/2016   ALT 11 09/13/2016   ALKPHOS 98 09/13/2016   BILITOT 0.5 06/26/2016 1424   GFRNONAA >60 06/26/2016 1424   GFRAA >60 06/26/2016 1424    Recent Labs  01/11/16 0353  03/26/16 06/26/16 06/26/16 1424 09/13/16  NA 131*  < > 135* 141 141 134*  K 3.7  < > 4.0  --  3.9 3.4  CL 99*  --   --   --  108  --   CO2 25  --   --   --  26  --   GLUCOSE 90  --    --   --  107*  --   BUN 17  < > 23* 22* 22* 11  CREATININE 0.52*  < > 0.5* 0.7 0.66 0.5*  CALCIUM 8.3*  --   --   --  9.1  --   < > = values in this interval not displayed.  Recent Labs  01/11/16 0353  03/26/16 06/26/16 1424 09/13/16  AST 37  < >  28 29 19   ALT 27  < > 18 20 11   ALKPHOS 114  < > 129* 105 98  BILITOT 0.3  --   --  0.5  --   PROT 7.7  --   --  8.3*  --   ALBUMIN 3.3*  --   --  3.6  --   < > = values in this interval not displayed.  Recent Labs  01/11/16 0353  03/26/16 06/26/16 06/26/16 1424 09/13/16  WBC 8.7  < > 6.6 11.3 11.3* 7.9  NEUTROABS 5.8  --   --   --  10.2*  --   HGB 11.0*  < > 12.1*  --  12.2* 12.2*  HCT 32.9*  < > 39*  --  36.9* 38*  MCV 69.6*  --   --   --  71.9*  --   PLT 192  < > 247  --  183 206  < > = values in this interval not displayed.  Recent Labs  01/25/16 01/26/16 09/13/16  CHOL 138 136 127  LDLCALC 62 62 70  TRIG 60 61 79   Lab Results  Component Value Date   MICROALBUR 29.1 10/09/2015   Lab Results  Component Value Date   TSH 1.78 09/13/2016   Lab Results  Component Value Date   HGBA1C 5.2 09/13/2016   Lab Results  Component Value Date   CHOL 127 09/13/2016   HDL 55 09/13/2016   LDLCALC 70 09/13/2016   TRIG 79 09/13/2016    Significant Diagnostic Results in last 30 days:  No results found.  Assessment and Plan  Seizures No reported or noted seizures; continue Dilantin 100 mg per tube twice a day  Diabetic gastroparesis There've been no reports of reflux or problems with patient's feeding tube plan to continue Reglan 10 mg 4 times a day per tube  Dysphagia Dysphasia from intracranial bleed as we'll be chronic and forever; plan to continue to monitor feeding tube     [name]. [name], MD

## 2016-09-13 DIAGNOSIS — D649 Anemia, unspecified: Secondary | ICD-10-CM | POA: Diagnosis not present

## 2016-09-13 DIAGNOSIS — E119 Type 2 diabetes mellitus without complications: Secondary | ICD-10-CM | POA: Diagnosis not present

## 2016-09-13 DIAGNOSIS — E559 Vitamin D deficiency, unspecified: Secondary | ICD-10-CM | POA: Diagnosis not present

## 2016-09-13 DIAGNOSIS — I10 Essential (primary) hypertension: Secondary | ICD-10-CM | POA: Diagnosis not present

## 2016-09-13 DIAGNOSIS — E039 Hypothyroidism, unspecified: Secondary | ICD-10-CM | POA: Diagnosis not present

## 2016-09-13 LAB — VITAMIN D 25 HYDROXY (VIT D DEFICIENCY, FRACTURES): Vit D, 25-Hydroxy: 22.52

## 2016-09-13 LAB — BASIC METABOLIC PANEL
BUN: 11 (ref 4–21)
CREATININE: 0.5 — AB (ref 0.6–1.3)
Glucose: 84
Potassium: 3.4 (ref 3.4–5.3)
Sodium: 134 — AB (ref 137–147)

## 2016-09-13 LAB — LIPID PANEL
Cholesterol: 127 (ref 0–200)
HDL: 55 (ref 35–70)
LDL CALC: 70
TRIGLYCERIDES: 79 (ref 40–160)

## 2016-09-13 LAB — HEPATIC FUNCTION PANEL
ALT: 11 (ref 10–40)
AST: 19 (ref 14–40)
Alkaline Phosphatase: 98 (ref 25–125)
BILIRUBIN, TOTAL: 0.4

## 2016-09-13 LAB — CBC AND DIFFERENTIAL
HCT: 38 — AB (ref 41–53)
Hemoglobin: 12.2 — AB (ref 13.5–17.5)
PLATELETS: 206 (ref 150–399)
WBC: 7.9

## 2016-09-13 LAB — HEMOGLOBIN A1C: HEMOGLOBIN A1C: 5.2

## 2016-09-13 LAB — TSH: TSH: 1.78 (ref 0.41–5.90)

## 2016-09-21 ENCOUNTER — Non-Acute Institutional Stay (SKILLED_NURSING_FACILITY): Payer: Medicare Other

## 2016-09-21 DIAGNOSIS — Z0000 Encounter for general adult medical examination without abnormal findings: Secondary | ICD-10-CM | POA: Diagnosis not present

## 2016-09-21 NOTE — Patient Instructions (Signed)
[name] , Thank you for taking time to come for your Medicare Wellness Visit. I appreciate your ongoing commitment to your health goals. Please review the following plan we discussed and let me know if I can assist you in the future.   Screening recommendations/referrals: Colonoscopy up to date-long term pt Recommended yearly ophthalmology/optometry visit for glaucoma screening and checkup Recommended yearly dental visit for hygiene and checkup  Vaccinations: Influenza vaccine due 01/21/17 Pneumococcal vaccine  Up to date Tdap vaccine due Shingles vaccine not in records  Advanced directives: Need a copy for chart  Conditions/risks identified: None  Next appointment: Dr. [name] makes rounds  Preventive Care 40-64 Years, Male Preventive care refers to lifestyle choices and visits with your health care provider that can promote health and wellness. What does preventive care include?  A yearly physical exam. This is also called an annual well check.  Dental exams once or twice a year.  Routine eye exams. Ask your health care provider how often you should have your eyes checked.  Personal lifestyle choices, including:  Daily care of your teeth and gums.  Regular physical activity.  Eating a healthy diet.  Avoiding tobacco and drug use.  Limiting alcohol use.  Practicing safe sex.  Taking low-dose aspirin every day starting at age 47. What happens during an annual well check? The services and screenings done by your health care provider during your annual well check will depend on your age, overall health, lifestyle risk factors, and family history of disease. Counseling  Your health care provider may ask you questions about your:  Alcohol use.  Tobacco use.  Drug use.  Emotional well-being.  Home and relationship well-being.  Sexual activity.  Eating habits.  Work and work Statistician. Screening  You may have the following tests or  measurements:  Height, weight, and BMI.  Blood pressure.  Lipid and cholesterol levels. These may be checked every 5 years, or more frequently if you are over 32 years old.  Skin check.  Lung cancer screening. You may have this screening every year starting at age 26 if you have a 30-pack-year history of smoking and currently smoke or have quit within the past 15 years.  Fecal occult blood test (FOBT) of the stool. You may have this test every year starting at age 93.  Flexible sigmoidoscopy or colonoscopy. You may have a sigmoidoscopy every 5 years or a colonoscopy every 10 years starting at age 9.  Prostate cancer screening. Recommendations will vary depending on your family history and other risks.  Hepatitis C blood test.  Hepatitis B blood test.  Sexually transmitted disease (STD) testing.  Diabetes screening. This is done by checking your blood sugar (glucose) after you have not eaten for a while (fasting). You may have this done every 1-3 years. Discuss your test results, treatment options, and if necessary, the need for more tests with your health care provider. Vaccines  Your health care provider may recommend certain vaccines, such as:  Influenza vaccine. This is recommended every year.  Tetanus, diphtheria, and acellular pertussis (Tdap, Td) vaccine. You may need a Td booster every 10 years.  Zoster vaccine. You may need this after age 53.  Pneumococcal 13-valent conjugate (PCV13) vaccine. You may need this if you have certain conditions and have not been vaccinated.  Pneumococcal polysaccharide (PPSV23) vaccine. You may need one or two doses if you smoke cigarettes or if you have certain conditions. Talk to your health care provider about which screenings and  vaccines you need and how often you need them. This information is not intended to replace advice given to you by your health care provider. Make sure you discuss any questions you have with your health care  provider. Document Released: 04/10/2015 Document Revised: 12/02/2015 Document Reviewed: 01/13/2015 Elsevier Interactive Patient Education  2017 [employer] Prevention in the Home Falls can cause injuries. They can happen to people of all ages. There are many things you can do to make your home safe and to help prevent falls. What can I do on the outside of my home?  Regularly fix the edges of walkways and driveways and fix any cracks.  Remove anything that might make you trip as you walk through a door, such as a raised step or threshold.  Trim any bushes or trees on the path to your home.  Use bright outdoor lighting.  Clear any walking paths of anything that might make someone trip, such as rocks or tools.  Regularly check to see if handrails are loose or broken. Make sure that both sides of any steps have handrails.  Any raised decks and porches should have guardrails on the edges.  Have any leaves, snow, or ice cleared regularly.  Use sand or salt on walking paths during winter.  Clean up any spills in your garage right away. This includes oil or grease spills. What can I do in the bathroom?  Use night lights.  Install grab bars by the toilet and in the tub and shower. Do not use towel bars as grab bars.  Use non-skid mats or decals in the tub or shower.  If you need to sit down in the shower, use a plastic, non-slip stool.  Keep the floor dry. Clean up any water that spills on the floor as soon as it happens.  Remove soap buildup in the tub or shower regularly.  Attach bath mats securely with double-sided non-slip rug tape.  Do not have throw rugs and other things on the floor that can make you trip. What can I do in the bedroom?  Use night lights.  Make sure that you have a light by your bed that is easy to reach.  Do not use any sheets or blankets that are too big for your bed. They should not hang down onto the floor.  Have a firm chair that has  side arms. You can use this for support while you get dressed.  Do not have throw rugs and other things on the floor that can make you trip. What can I do in the kitchen?  Clean up any spills right away.  Avoid walking on wet floors.  Keep items that you use a lot in easy-to-reach places.  If you need to reach something above you, use a strong step stool that has a grab bar.  Keep electrical cords out of the way.  Do not use floor polish or wax that makes floors slippery. If you must use wax, use non-skid floor wax.  Do not have throw rugs and other things on the floor that can make you trip. What can I do with my stairs?  Do not leave any items on the stairs.  Make sure that there are handrails on both sides of the stairs and use them. Fix handrails that are broken or loose. Make sure that handrails are as long as the stairways.  Check any carpeting to make sure that it is firmly attached to the stairs. Fix any  carpet that is loose or worn.  Avoid having throw rugs at the top or bottom of the stairs. If you do have throw rugs, attach them to the floor with carpet tape.  Make sure that you have a light switch at the top of the stairs and the bottom of the stairs. If you do not have them, ask someone to add them for you. What else can I do to help prevent falls?  Wear shoes that:  Do not have high heels.  Have rubber bottoms.  Are comfortable and fit you well.  Are closed at the toe. Do not wear sandals.  If you use a stepladder:  Make sure that it is fully opened. Do not climb a closed stepladder.  Make sure that both sides of the stepladder are locked into place.  Ask someone to hold it for you, if possible.  Clearly mark and make sure that you can see:  Any grab bars or handrails.  First and last steps.  Where the edge of each step is.  Use tools that help you move around (mobility aids) if they are needed. These  include:  Canes.  Walkers.  Scooters.  Crutches.  Turn on the lights when you go into a dark area. Replace any light bulbs as soon as they burn out.  Set up your furniture so you have a clear path. Avoid moving your furniture around.  If any of your floors are uneven, fix them.  If there are any pets around you, be aware of where they are.  Review your medicines with your doctor. Some medicines can make you feel dizzy. This can increase your chance of falling. Ask your doctor what other things that you can do to help prevent falls. This information is not intended to replace advice given to you by your health care provider. Make sure you discuss any questions you have with your health care provider. Document Released: 01/08/2009 Document Revised: 08/20/2015 Document Reviewed: 04/18/2014 Elsevier Interactive Patient Education  2017 [employer].

## 2016-09-21 NOTE — Progress Notes (Signed)
Subjective:   [name] is a 61 y.o. male who presents for an Initial Medicare Annual Wellness Visit at [facility]; incapacitated patient unable to answer questions appropriately   Objective:    Today's Vitals   09/21/16 1104  BP: 120/60  Pulse: 77  Temp: 97.3 F (36.3 C)  TempSrc: Oral  SpO2: 96%  Weight: 143 lb (64.9 kg)  Height: 5\' 9"  (1.753 m)   Body mass index is 21.12 kg/m.  Current Medications (verified) Outpatient Encounter Prescriptions as of 09/21/2016  Medication Sig  . acetaminophen (TYLENOL) 325 MG tablet Place 650 mg into feeding tube every 6 (six) hours as needed. Fever > 101  . baclofen (LIORESAL) 10 MG tablet Place 1 tablet (10 mg total) into feeding tube 3 (three) times daily.  . bisacodyl (DULCOLAX) 10 MG suppository Place 10 mg rectally daily as needed for moderate constipation. If constipation not relieved by milk of magnesia give one 10 mg suppository in 24hours  . ferrous sulfate 220 (44 FE) MG/5ML solution Place 330 mg into feeding tube See admin instructions. Give 7.85ml  Per tube every morning  . folic acid (FOLVITE) 1 MG tablet 1 mg by PEG Tube route daily.  Marland Kitchen loratadine (CLARITIN) 10 MG tablet 10 mg by PEG Tube route daily.  . magnesium hydroxide (MILK OF MAGNESIA) 400 MG/5ML suspension Take 30 mLs by mouth daily as needed for mild constipation.  . metoCLOPramide (REGLAN) 10 MG tablet Place 1 tablet (10 mg total) into feeding tube 4 (four) times daily -  before meals and at bedtime. Via Peg tube  . Multiple Vitamins-Minerals (CENTAMIN) LIQD 5 mLs by PEG Tube route daily.   . Nutritional Supplements (FEEDING SUPPLEMENT, JEVITY 1.5 CAL,) LIQD Place into feeding tube. Initiate Jevity 1.5 cal @@ 58 ml/hr continuous x 20 hr via peg ( hold 1 hr pre/post phenton administration)  . olopatadine (PATANOL) 0.1 % ophthalmic solution Place 1 drop into both eyes every morning.  . phenytoin (DILANTIN) 125 MG/5ML suspension Place 4 mLs (100 mg total)  into feeding tube 2 (two) times daily.  . polyvinyl alcohol (LIQUIFILM TEARS) 1.4 % ophthalmic solution Place 1 drop into both eyes daily at 6 (six) AM.  . prednisoLONE (PRELONE) 15 MG/5ML SOLN Place into feeding tube. Give 6 mg every morning and 4 mg every evening at 6 pm  . promethazine (PHENERGAN) 25 MG tablet Place 25 mg into feeding tube every 6 (six) hours as needed for nausea or vomiting.  . ranitidine (ZANTAC) 300 MG tablet Place 300 mg into feeding tube daily. PEG tube   . Soap & Cleansers (CETAPHIL GENTLE CLEANSER) LIQD Apply to face and neck with water and then pat dry daily.  . Sodium Phosphates (RA SALINE ENEMA) 19-7 GM/118ML ENEM Place 1 each rectally as needed (for constipation).  . Vitamin D, Ergocalciferol, (DRISDOL) 50000 units CAPS capsule Take 50,000 Units by mouth every 7 (seven) days.  . Water For Irrigation, Sterile (STERILE WATER FOR IRRIGATION) Flush of water into peg tub every 4 hours   No facility-administered encounter medications on file as of 09/21/2016.     Allergies (verified) Aspirin; Keflex [cephalexin]; Nsaids; and Penicillins   History: Past Medical History:  Diagnosis Date  . Adrenocortical insufficiency (HCC)   . Allergic rhinitis   . Anemia    Iron deficient  . Aphasia 06/19/2013  . Cataract   . Clostridium difficile colitis 02/2014  . Constipation   . Contracture of joint of left hand   .  Depression   . Dysphagia 06/19/2013  . GERD (gastroesophageal reflux disease)   . Impaired verbal communication   . Nontraumatic chronic subdural hemorrhage (HCC)   . Parkinson's disease (HCC)   . Pneumonia 07/2014   aspiration pneumonia  . Pyelonephritis   . Quadriplegia (HCC)   . S/P percutaneous endoscopic gastrostomy (PEG) tube placement (HCC)   . Seizures (HCC)    last occurance 2015  . SIRS (systemic inflammatory response syndrome) (HCC) 08/06/2014  . Type 2 diabetes mellitus without complication (HCC) 03/11/2014  . UTI (urinary tract  infection) 07/2014  . Wheelchair bound    Past Surgical History:  Procedure Laterality Date  . COLONOSCOPY    . ESOPHAGOGASTRODUODENOSCOPY    . PEG TUBE PLACEMENT    . PERIPHERALLY INSERTED CENTRAL CATHETER INSERTION    . TONSILLECTOMY  1962   Family History  Problem Relation Age of Onset  . Hypothyroidism Mother   . Hypertension Mother   . Diabetes Father   . Hypertension Father   . Diabetes Paternal Grandfather    Social History   Occupational History  . truck driver    Social History Main Topics  . Smoking status: Never Smoker  . Smokeless tobacco: Never Used  . Alcohol use No  . Drug use: No  . Sexual activity: Not Currently   Tobacco Counseling Counseling given: Not Answered   Activities of Daily Living In your present state of health, do you have any difficulty performing the following activities: 09/21/2016  Hearing? N  Vision? Y  Difficulty concentrating or making decisions? Y  Walking or climbing stairs? Y  Dressing or bathing? Y  Doing errands, shopping? Y  Preparing Food and eating ? Y  Using the Toilet? Y  In the past six months, have you accidently leaked urine? Y  Do you have problems with loss of bowel control? Y  Managing your Medications? Y  Managing your Finances? Y  Housekeeping or managing your Housekeeping? Y  Some recent data might be hidden    Immunizations and Health Maintenance Immunization History  Administered Date(s) Administered  . Influenza Whole 12/31/2012  . Influenza-Unspecified 12/29/2011, 12/31/2012, 01/07/2014, 12/22/2014, 01/22/2016  . PPD Test 08/22/2008  . Pneumococcal-Unspecified 06/23/2010, 06/23/2015   Health Maintenance Due  Topic Date Due  . OPHTHALMOLOGY EXAM  05/25/2016  . HEMOGLOBIN A1C  07/26/2016    Patient Care Team: Margit Hanks, MD as PCP - General (Internal Medicine)  Indicate any recent Medical Services you may have received from other than [facility] providers in the past year (date may be  approximate).    Assessment:   This is a routine wellness examination for [name].   Hearing/Vision screen No exam data present  Dietary issues and exercise activities discussed: Current Exercise Habits: The patient does not participate in regular exercise at present, Exercise limited by: orthopedic condition(s)  Goals    None     Depression Screen PHQ 2/9 Scores 09/21/2016  PHQ - 2 Score 0    Fall Risk Fall Risk  09/21/2016  Falls in the past year? No    Cognitive Function: MMSE - Mini Mental State Exam 09/21/2016  Not completed: Unable to complete        Screening Tests Health Maintenance  Topic Date Due  . OPHTHALMOLOGY EXAM  05/25/2016  . HEMOGLOBIN A1C  07/26/2016  . TETANUS/TDAP  03/28/2018 (Originally 09/18/1975)  . Hepatitis C Screening  03/28/2018 (Originally Dec 28, 1955)  . HIV Screening  03/28/2018 (Originally 09/18/1971)  . COLONOSCOPY  03/29/2023 (  Originally 09/18/2006)  . URINE MICROALBUMIN  10/08/2016  . INFLUENZA VACCINE  10/26/2016  . FOOT EXAM  06/15/2017  . PNEUMOCOCCAL POLYSACCHARIDE VACCINE  Completed        Plan:    I have personally reviewed and addressed the Medicare Annual Wellness questionnaire and have noted the following in the patient's chart:  A. Medical and social history B. Use of alcohol, tobacco or illicit drugs  C. Current medications and supplements D. Functional ability and status E.  Nutritional status F.  Physical activity G. Advance directives H. List of other physicians I.  Hospitalizations, surgeries, and ER visits in previous 12 months J.  Vitals K. Screenings to include hearing, vision, cognitive, depression L. Referrals and appointments - none  In addition, I have reviewed and discussed with patient certain preventive protocols, quality metrics, and best practice recommendations. A written personalized care plan for preventive services as well as general preventive health recommendations were provided to  patient.  See attached scanned questionnaire for additional information.   Signed,   Annetta Maw, RN Nurse Health Advisor   Quick Notes   Health Maintenance:      Abnormal Screen:     Patient Concerns:      Nurse Concerns:

## 2016-09-29 ENCOUNTER — Encounter: Payer: Self-pay | Admitting: Internal Medicine

## 2016-09-29 ENCOUNTER — Non-Acute Institutional Stay (SKILLED_NURSING_FACILITY): Payer: Medicare Other | Admitting: Internal Medicine

## 2016-09-29 DIAGNOSIS — E559 Vitamin D deficiency, unspecified: Secondary | ICD-10-CM | POA: Diagnosis not present

## 2016-09-29 DIAGNOSIS — E2740 Unspecified adrenocortical insufficiency: Secondary | ICD-10-CM | POA: Diagnosis not present

## 2016-09-29 DIAGNOSIS — K219 Gastro-esophageal reflux disease without esophagitis: Secondary | ICD-10-CM

## 2016-09-29 NOTE — Progress Notes (Signed)
Location:  [facility] Room Number: Advance:  [id number] [phone])  [name], MD  Patient Care Team: [name], MD as PCP - General (Internal Medicine)  Extended Emergency Contact Information Primary Emergency Contact: Switala,[name] Address: [address]          [place], [place] [address] [name] Phone: [phone] Mobile Phone: [phone] Relation: Mother Secondary Emergency Contact: Constance Haw [place] Phone: [phone] Relation: Brother Father: Jep, Dyas [place] Mobile Phone: [phone]    Allergies: Aspirin; Keflex [cephalexin]; Nsaids; and Penicillins  Chief Complaint  Patient presents with  . Medical Management of Chronic Issues    routine visit    HPI: Patient is 61 y.o. male, With functional quadriplegia who is being seen for routine issues of GERD, adrenal insufficiency, and vitamin D deficiency  Past Medical History:  Diagnosis Date  . Adrenocortical insufficiency ([place])   . Allergic rhinitis   . Anemia    Iron deficient  . Aphasia 06/19/2013  . Cataract   . Clostridium difficile colitis 02/2014  . Constipation   . Contracture of joint of left hand   . Depression   . Dysphagia 06/19/2013  . GERD (gastroesophageal reflux disease)   . Impaired verbal communication   . Nontraumatic chronic subdural hemorrhage ([place])   . Parkinson's disease ([place])   . Pneumonia 07/2014   aspiration pneumonia  . Pyelonephritis   . Quadriplegia ([place])   . S/P percutaneous endoscopic gastrostomy (PEG) tube placement ([place])   . Seizures ([place])    last occurance 2015  . SIRS (systemic inflammatory response syndrome) ([place]) 08/06/2014  . Type 2 diabetes mellitus without complication ([place]) 25/95/6387  . UTI (urinary tract infection) 07/2014  . Wheelchair bound     Past Surgical History:  Procedure Laterality Date  . COLONOSCOPY    .  ESOPHAGOGASTRODUODENOSCOPY    . PEG TUBE PLACEMENT    . PERIPHERALLY INSERTED CENTRAL CATHETER INSERTION    . TONSILLECTOMY  1962    Allergies as of 09/29/2016      Reactions   Aspirin Other (See Comments)   Unknown reaction; "blood doesn't clog too well" per mother.    Keflex [cephalexin]    Rash   Nsaids Other (See Comments)   Unknown reaction   Penicillins Hives, Other (See Comments)   Unknown reaction.  Pt has tolerated cephalosporins in the past.      Medication List       Accurate as of 09/29/16 11:59 PM. Always use your most recent med list.          acetaminophen 325 MG tablet Commonly known as:  TYLENOL Place 650 mg into feeding tube every 6 (six) hours as needed. Fever > 101   baclofen 10 MG tablet Commonly known as:  LIORESAL Place 1 tablet (10 mg total) into feeding tube 3 (three) times daily.   bisacodyl 10 MG suppository Commonly known as:  DULCOLAX Place 10 mg rectally daily as needed for moderate constipation. If constipation not relieved by milk of magnesia give one 10 mg suppository in 24hours   CENTAMIN Liqd 5 mLs by PEG Tube route daily.   CETAPHIL GENTLE CLEANSER Liqd Apply to face and neck with water and then pat dry daily.   feeding supplement (JEVITY 1.5 CAL) Liqd Place into feeding tube. Initiate Jevity 1.5 cal @@ 58 ml/hr continuous x 20 hr via peg ( hold 1 hr  pre/post phenton administration)   ferrous sulfate 220 (44 Fe) MG/5ML solution Place 330 mg into feeding tube See admin instructions. Give 7.11ml  Per tube every morning   folic acid 1 MG tablet Commonly known as:  FOLVITE 1 mg by PEG Tube route daily.   loratadine 10 MG tablet Commonly known as:  CLARITIN 10 mg by PEG Tube route daily.   magnesium hydroxide 400 MG/5ML suspension Commonly known as:  MILK OF MAGNESIA Take 30 mLs by mouth daily as needed for mild constipation.   metoCLOPramide 10 MG tablet Commonly known as:  REGLAN Place 1 tablet (10 mg total) into feeding tube  4 (four) times daily -  before meals and at bedtime. Via Peg tube   olopatadine 0.1 % ophthalmic solution Commonly known as:  PATANOL Place 1 drop into both eyes every morning.   phenytoin 125 MG/5ML suspension Commonly known as:  DILANTIN Place 4 mLs (100 mg total) into feeding tube 2 (two) times daily.   polyvinyl alcohol 1.4 % ophthalmic solution Commonly known as:  LIQUIFILM TEARS Place 1 drop into both eyes daily at 6 (six) AM.   prednisoLONE 15 MG/5ML Soln Commonly known as:  PRELONE Place into feeding tube. Give 6 mg every morning and 4 mg every evening at 6 pm   promethazine 25 MG tablet Commonly known as:  PHENERGAN Place 25 mg into feeding tube every 6 (six) hours as needed for nausea or vomiting.   RA SALINE ENEMA 19-7 GM/118ML Enem Place 1 each rectally as needed (for constipation).   ranitidine 300 MG tablet Commonly known as:  ZANTAC Place 300 mg into feeding tube daily. PEG tube   sterile water for irrigation Flush 235ml of water into peg tub every 4 hours   Vitamin D (Ergocalciferol) 50000 units Caps capsule Commonly known as:  DRISDOL Take 50,000 Units by mouth every 7 (seven) days.       No orders of the defined types were placed in this encounter.   Immunization History  Administered Date(s) Administered  . Influenza Whole 12/31/2012  . Influenza-Unspecified 12/29/2011, 12/31/2012, 01/07/2014, 12/22/2014, 01/22/2016  . PPD Test 08/22/2008  . Pneumococcal-Unspecified 06/23/2010, 06/23/2015    Social History  Substance Use Topics  . Smoking status: Never Smoker  . Smokeless tobacco: Never Used  . Alcohol use No    Review of Systems  DATA OBTAINED: from patient-Can point to  sentences on his tablet; nursing-no new concerns GENERAL:  no fevers, fatigue, appetite changes SKIN: No itching, rash HEENT: No complaint RESPIRATORY: No cough, wheezing, SOB CARDIAC: No chest pain, palpitations, lower extremity edema  GI: No abdominal pain, No  N/V/D or constipation, No heartburn or reflux  GU: No dysuria, frequency or urgency, or incontinence  MUSCULOSKELETAL: No unrelieved bone/joint pain NEUROLOGIC: No headache, dizziness  PSYCHIATRIC: No overt anxiety or sadness  Vitals:   09/29/16 1510  BP: 118/73  Pulse: 82  Resp: 18  Temp: (!) 97.1 F (36.2 C)   Body mass index is 21.29 kg/m. Physical Exam  GENERAL APPEARANCE: Alert, non conversant, No acute distress  SKIN: No diaphoresis rash HEENT: Unremarkable RESPIRATORY: Breathing is even, unlabored. Lung sounds are clear   CARDIOVASCULAR: Heart RRR no murmurs, rubs or gallops. No peripheral edema  GASTROINTESTINAL: Abdomen is soft, non-tender, not distended w/ normal bowel sounds; PEG tube.  GENITOURINARY: Bladder non tender, not distended  MUSCULOSKELETAL: Wasting and contractures NEUROLOGIC: Cranial nerves 2-12 grossly intact ; quadriplegia, patient has some little movement in his upper extremities PSYCHIATRIC:  Mood and affect appropriate to situation, no behavioral issues  Patient Active Problem List   Diagnosis Date Noted  . Vitamin D deficiency 10/16/2016  . Anemia   . GERD (gastroesophageal reflux disease)   . Clostridium difficile colitis   . Adrenocortical insufficiency ([place])   . Cataract 06/13/2016  . Constipation 06/13/2016  . Contracture of joint of left hand 06/13/2016  . Impaired verbal communication 06/13/2016  . Nontraumatic chronic subdural hemorrhage ([place]) 06/13/2016  . Nonverbal 06/13/2016  . Wheelchair bound 06/13/2016  . Allergic rhinitis 06/13/2016  . S/P percutaneous endoscopic gastrostomy (PEG) tube placement ([place]) 06/13/2016  . Itchy eyes 11/12/2015  . Benign neoplasm of colon 05/22/2015  . History of colon polyps 05/22/2015  . Lung nodule 05/22/2015  . Abnormal levels of other serum enzymes 05/22/2015  . Gastroesophageal reflux disease 03/17/2015  . Tinea cruris 09/10/2014  . Aspiration pneumonia ([place]) 08/15/2014  . Abscess, prostate  08/15/2014  . Pyelonephritis 08/06/2014  . Nausea & vomiting   . Fever presenting with conditions classified elsewhere 03/16/2014  . Altered mental status 03/16/2014  . Acute bronchiolitis due to unspecified organism 03/11/2014  . Enteritis due to Clostridium difficile 03/11/2014  . Type 2 diabetes mellitus without complication ([place]) 95/62/1308  . Septic shock ([place]) 01/26/2014  . Sepsis ([place]) 01/25/2014  . Acute encephalopathy 01/02/2014  . Acute respiratory failure with hypercapnia ([place]) 01/02/2014  . Chronic anemia 01/02/2014  . Subdural hematoma ([place]) 01/02/2014  . Pain in thoracic spine 12/26/2013  . PEG (percutaneous endoscopic gastrostomy) status ([place]) 10/04/2013  . Rash and nonspecific skin eruption 07/08/2013  . Thrush 07/08/2013  . Heme positive stool 07/07/2013  . Severe sepsis ([place]) 06/23/2013  . E. coli pyelonephritis 06/22/2013  . SIRS (systemic inflammatory response syndrome) ([place]) 06/19/2013  . Leukocytosis 06/19/2013  . Bronchitis 06/19/2013  . Dysphagia 06/19/2013  . Aphasia 06/19/2013  . Other convulsions 08/10/2012  . Iron deficiency anemia 08/10/2012  . Glucocorticoid deficiency ([place]) 08/10/2012  . Febrile illness, acute 06/15/2012  . UTI (lower urinary tract infection) 06/15/2012  . Hypokalemia 11/10/2011  . Diabetic gastroparesis ([place]) 11/08/2011  . Ileus ([place]) 11/08/2011  . Hyponatremia 11/08/2011  . Adrenal insufficiency ([place]) 11/08/2011  . Quadriplegia ([place]) 11/08/2011  . Parkinson disease ([place])   . Seizures ([place])   . Depression     CMP     Component Value Date/Time   NA 134 (A) 09/13/2016   K 3.4 09/13/2016   CL 108 06/26/2016 1424   CO2 26 06/26/2016 1424   GLUCOSE 107 (H) 06/26/2016 1424   BUN 11 09/13/2016   CREATININE 0.5 (A) 09/13/2016   CREATININE 0.66 06/26/2016 1424   CALCIUM 9.1 06/26/2016 1424   PROT 8.3 (H) 06/26/2016 1424   ALBUMIN 3.6 06/26/2016 1424   AST 19 09/13/2016   ALT 11 09/13/2016   ALKPHOS 98 09/13/2016    BILITOT 0.5 06/26/2016 1424   GFRNONAA >60 06/26/2016 1424   GFRAA >60 06/26/2016 1424    Recent Labs  01/11/16 0353  03/26/16 06/26/16 06/26/16 1424 09/13/16  NA 131*  < > 135* 141 141 134*  K 3.7  < > 4.0  --  3.9 3.4  CL 99*  --   --   --  108  --   CO2 25  --   --   --  26  --   GLUCOSE 90  --   --   --  107*  --   BUN 17  < > 23* 22* 22* 11  CREATININE 0.52*  < > 0.5* 0.7 0.66 0.5*  CALCIUM 8.3*  --   --   --  9.1  --   < > = values in this interval not displayed.  Recent Labs  01/11/16 0353  03/26/16 06/26/16 1424 09/13/16  AST 37  < > 28 29 19   ALT 27  < > 18 20 11   ALKPHOS 114  < > 129* 105 98  BILITOT 0.3  --   --  0.5  --   PROT 7.7  --   --  8.3*  --   ALBUMIN 3.3*  --   --  3.6  --   < > = values in this interval not displayed.  Recent Labs  01/11/16 0353  03/26/16 06/26/16 06/26/16 1424 09/13/16  WBC 8.7  < > 6.6 11.3 11.3* 7.9  NEUTROABS 5.8  --   --   --  10.2*  --   HGB 11.0*  < > 12.1*  --  12.2* 12.2*  HCT 32.9*  < > 39*  --  36.9* 38*  MCV 69.6*  --   --   --  71.9*  --   PLT 192  < > 247  --  183 206  < > = values in this interval not displayed.  Recent Labs  01/25/16 01/26/16 09/13/16  CHOL 138 136 127  LDLCALC 62 62 70  TRIG 60 61 79   Lab Results  Component Value Date   MICROALBUR 29.1 10/09/2015   Lab Results  Component Value Date   TSH 1.78 09/13/2016   Lab Results  Component Value Date   HGBA1C 5.2 09/13/2016   Lab Results  Component Value Date   CHOL 127 09/13/2016   HDL 55 09/13/2016   LDLCALC 70 09/13/2016   TRIG 79 09/13/2016    Significant Diagnostic Results in last 30 days:  No results found.  Assessment and Plan  Gastroesophageal reflux disease No recent problems with reflux or PEG tube; plan to continue Reglan 10 mg with meals and at bedtime  Adrenocortical insufficiency (HCC) Stable and chronic; plan to continue prednisone 6 mg in the morning and 4 mg at night  Vitamin D deficiency Recent vitamin D  level was 22; patient was started on vitamin D 50,000 units weekly for 12 weeks; will repeat vitamin D level in 12 weeks    Webb Silversmith D. [name], MD

## 2016-10-09 ENCOUNTER — Encounter: Payer: Self-pay | Admitting: Internal Medicine

## 2016-10-09 NOTE — Assessment & Plan Note (Signed)
There've been no reports of reflux or problems with patient's feeding tube plan to continue Reglan 10 mg 4 times a day per tube

## 2016-10-09 NOTE — Assessment & Plan Note (Signed)
Dysphasia from intracranial bleed as we'll be chronic and forever; plan to continue to monitor feeding tube

## 2016-10-09 NOTE — Assessment & Plan Note (Signed)
No reported or noted seizures; continue Dilantin 100 mg per tube twice a day

## 2016-10-11 DIAGNOSIS — I739 Peripheral vascular disease, unspecified: Secondary | ICD-10-CM | POA: Diagnosis not present

## 2016-10-11 DIAGNOSIS — L603 Nail dystrophy: Secondary | ICD-10-CM | POA: Diagnosis not present

## 2016-10-11 DIAGNOSIS — B351 Tinea unguium: Secondary | ICD-10-CM | POA: Diagnosis not present

## 2016-10-11 DIAGNOSIS — M79675 Pain in left toe(s): Secondary | ICD-10-CM | POA: Diagnosis not present

## 2016-10-11 DIAGNOSIS — M79674 Pain in right toe(s): Secondary | ICD-10-CM | POA: Diagnosis not present

## 2016-10-16 ENCOUNTER — Encounter: Payer: Self-pay | Admitting: Internal Medicine

## 2016-10-16 DIAGNOSIS — E559 Vitamin D deficiency, unspecified: Secondary | ICD-10-CM

## 2016-10-16 HISTORY — DX: Vitamin D deficiency, unspecified: E55.9

## 2016-10-16 NOTE — Assessment & Plan Note (Signed)
No recent problems with reflux or PEG tube; plan to continue Reglan 10 mg with meals and at bedtime

## 2016-10-16 NOTE — Assessment & Plan Note (Signed)
Recent vitamin D level was 22; patient was started on vitamin D 50,000 units weekly for 12 weeks; will repeat vitamin D level in 12 weeks

## 2016-10-16 NOTE — Assessment & Plan Note (Signed)
Stable and chronic; plan to continue prednisone 6 mg in the morning and 4 mg at night

## 2016-11-03 ENCOUNTER — Encounter: Payer: Self-pay | Admitting: Internal Medicine

## 2016-11-03 ENCOUNTER — Non-Acute Institutional Stay (SKILLED_NURSING_FACILITY): Payer: Medicare Other | Admitting: Internal Medicine

## 2016-11-03 DIAGNOSIS — R1310 Dysphagia, unspecified: Secondary | ICD-10-CM

## 2016-11-03 DIAGNOSIS — K219 Gastro-esophageal reflux disease without esophagitis: Secondary | ICD-10-CM | POA: Diagnosis not present

## 2016-11-03 DIAGNOSIS — K3184 Gastroparesis: Secondary | ICD-10-CM

## 2016-11-03 DIAGNOSIS — E1143 Type 2 diabetes mellitus with diabetic autonomic (poly)neuropathy: Secondary | ICD-10-CM

## 2016-11-03 DIAGNOSIS — Z931 Gastrostomy status: Secondary | ICD-10-CM | POA: Diagnosis not present

## 2016-11-03 NOTE — Progress Notes (Signed)
Location:  [facility] Room Number: 214D Place of Service:  SNF (31)  [name], MD  Patient Care Team: [name], MD as PCP - General (Internal Medicine)  Extended Emergency Contact Information Primary Emergency Contact: Marceaux,[name] Address: [address]          [place], [place] [address] [name] Phone: [phone] Mobile Phone: [phone] Relation: Mother Secondary Emergency Contact: Constance Haw [place] Phone: [phone] Relation: Brother Father: [place], [place] Mobile Phone: [phone]    Allergies: Aspirin; Keflex [cephalexin]; Nsaids; and Penicillins  Chief Complaint  Patient presents with  . Medical Management of Chronic Issues    routine visit    HPI: Patient is 61 y.o. male who Is being seen for routine issues of dysphasia, gastroparesis, and GERD.  Past Medical History:  Diagnosis Date  . Acute encephalopathy 01/02/2014  . Adrenal insufficiency ([place]) 11/08/2011  . Adrenocortical insufficiency ([place])   . Allergic rhinitis   . Anemia    Iron deficient  . Aphasia 06/19/2013  . Cataract   . Clostridium difficile colitis 02/2014  . Constipation   . Contracture of joint of left hand   . Depression   . Diabetic gastroparesis ([place]) 11/08/2011  . Dysphagia 06/19/2013  . Gastroesophageal reflux disease 03/17/2015  . GERD (gastroesophageal reflux disease)   . Glucocorticoid deficiency ([place]) 08/10/2012  . History of colon polyps 05/22/2015  . Ileus ([place]) 11/08/2011  . Impaired verbal communication   . Iron deficiency anemia 08/10/2012  . Lung nodule 05/22/2015  . Nontraumatic chronic subdural hemorrhage ([place])   . Parkinson disease ([place])   . Parkinson's disease ([place])   . PEG (percutaneous endoscopic gastrostomy) status ([place]) 10/04/2013  . Pneumonia 07/2014   aspiration pneumonia  . Pyelonephritis   . Quadriplegia ([place])   . S/P  percutaneous endoscopic gastrostomy (PEG) tube placement ([place])   . Seizures ([place])    last occurance 2015  . SIRS (systemic inflammatory response syndrome) ([place]) 08/06/2014  . Subdural hematoma ([place]) 01/02/2014  . Type 2 diabetes mellitus without complication ([place]) 75/12/2583  . UTI (urinary tract infection) 07/2014  . Vitamin D deficiency 10/16/2016  . Wheelchair bound     Past Surgical History:  Procedure Laterality Date  . COLONOSCOPY    . ESOPHAGOGASTRODUODENOSCOPY    . PEG TUBE PLACEMENT    . PERIPHERALLY INSERTED CENTRAL CATHETER INSERTION    . TONSILLECTOMY  1962    Allergies as of 11/03/2016      Reactions   Aspirin Other (See Comments)   Unknown reaction; "blood doesn't clog too well" per mother.    Keflex [cephalexin]    Rash   Nsaids Other (See Comments)   Unknown reaction   Penicillins Hives, Other (See Comments)   Unknown reaction.  Pt has tolerated cephalosporins in the past.      Medication List       Accurate as of 11/03/16 11:59 PM. Always use your most recent med list.          acetaminophen 325 MG tablet Commonly known as:  TYLENOL Place 650 mg into feeding tube every 6 (six) hours as needed. Fever > 101   baclofen 10 MG tablet Commonly known as:  LIORESAL Place 1 tablet (10 mg total) into feeding tube 3 (three) times daily.   bisacodyl 10 MG suppository Commonly known as:  DULCOLAX Place 10 mg rectally daily as needed for moderate constipation.  If constipation not relieved by milk of magnesia give one 10 mg suppository in 24hours   CENTAMIN Liqd 5 mLs by PEG Tube route daily.   CETAPHIL GENTLE CLEANSER Liqd Apply to face and neck with water and then pat dry daily.   feeding supplement (JEVITY 1.5 CAL) Liqd Place into feeding tube. Initiate Jevity 1.5 cal @@ 58 ml/hr continuous x 20 hr via peg ( hold 1 hr pre/post phenton administration)   ferrous sulfate 220 (44 Fe) MG/5ML solution Place 330 mg into feeding tube See admin instructions. Give  7.38ml  Per tube every morning   folic acid 1 MG tablet Commonly known as:  FOLVITE 1 mg by PEG Tube route daily.   loratadine 10 MG tablet Commonly known as:  CLARITIN 10 mg by PEG Tube route daily.   magnesium hydroxide 400 MG/5ML suspension Commonly known as:  MILK OF MAGNESIA Take 30 mLs by mouth daily as needed for mild constipation.   metoCLOPramide 10 MG tablet Commonly known as:  REGLAN Place 1 tablet (10 mg total) into feeding tube 4 (four) times daily -  before meals and at bedtime. Via Peg tube   olopatadine 0.1 % ophthalmic solution Commonly known as:  PATANOL Place 1 drop into both eyes every morning.   phenytoin 125 MG/5ML suspension Commonly known as:  DILANTIN Place 4 mLs (100 mg total) into feeding tube 2 (two) times daily.   polyvinyl alcohol 1.4 % ophthalmic solution Commonly known as:  LIQUIFILM TEARS Place 1 drop into both eyes daily at 6 (six) AM.   prednisoLONE 15 MG/5ML Soln Commonly known as:  PRELONE Place into feeding tube. Give 6 mg every morning and 4 mg every evening at 6 pm   promethazine 25 MG tablet Commonly known as:  PHENERGAN Place 25 mg into feeding tube every 6 (six) hours as needed for nausea or vomiting.   RA SALINE ENEMA 19-7 GM/118ML Enem Place 1 each rectally as needed (for constipation).   ranitidine 300 MG tablet Commonly known as:  ZANTAC Place 300 mg into feeding tube daily. PEG tube   sterile water for irrigation Flush 260ml of water into peg tub every 4 hours   Vitamin D (Ergocalciferol) 50000 units Caps capsule Commonly known as:  DRISDOL Take 50,000 Units by mouth every 7 (seven) days.            Discharge Care Instructions        Start     Ordered   11/03/16 0000  [facility] DIABETES EYE EXAM    Comments:  This external order was created through the Results Console.   11/03/16 1420      No orders of the defined types were placed in this encounter.   Immunization History  Administered Date(s)  Administered  . Influenza Whole 12/31/2012  . Influenza-Unspecified 12/29/2011, 12/31/2012, 01/07/2014, 12/22/2014, 01/22/2016  . PPD Test 08/22/2008  . Pneumococcal-Unspecified 06/23/2010, 06/23/2015    Social History  Substance Use Topics  . Smoking status: Never Smoker  . Smokeless tobacco: Never Used  . Alcohol use No    Review of Systems  DATA OBTAINED: from patient, nurse GENERAL:  no fevers, fatigue, appetite changes SKIN: No itching, rash HEENT: No complaint RESPIRATORY: No cough, wheezing, SOB CARDIAC: No chest pain, palpitations, lower extremity edema  GI: No abdominal pain, No N/V/D or constipation, No heartburn or reflux  GU: No dysuria, frequency or urgency, or incontinence  MUSCULOSKELETAL: No unrelieved bone/joint pain NEUROLOGIC: No headache, dizziness  PSYCHIATRIC: No overt  anxiety or sadness  Vitals:   11/03/16 1415  BP: 118/73  Pulse: 81  Resp: 20  Temp: (!) 97.1 F (36.2 C)   Body mass index is 22 kg/m. Physical Exam  GENERAL APPEARANCE: Alert, minconversant, can write answers are white board , No acute distress  SKIN: No diaphoresis rash HEENT: Unremarkable RESPIRATORY: Breathing is even, unlabored. Lung sounds are clear   CARDIOVASCULAR: Heart RRR no murmurs, rubs or gallops. No peripheral edema  GASTROINTESTINAL: Abdomen is soft, non-tender, not distended w/ normal bowel sounds.; PEG tube  GENITOURINARY: Bladder non tender, not distended  MUSCULOSKELETAL: Infectious and wasting NEUROLOGIC: Cranial nerves 2-12 grossly intact; quadriplegia with some improvement of bilateral upper extremities PSYCHIATRIC: Mood and affect appropriate to situation, no behavioral issues  Physical examination has not changed since last visit.   Patient Active Problem List   Diagnosis Date Noted  . Vitamin D deficiency 10/16/2016  . Anemia   . GERD (gastroesophageal reflux disease)   . Clostridium difficile colitis   . Adrenocortical insufficiency ([place])   .  Cataract 06/13/2016  . Constipation 06/13/2016  . Contracture of joint of left hand 06/13/2016  . Impaired verbal communication 06/13/2016  . Nontraumatic chronic subdural hemorrhage ([place]) 06/13/2016  . Nonverbal 06/13/2016  . Wheelchair bound 06/13/2016  . Allergic rhinitis 06/13/2016  . S/P percutaneous endoscopic gastrostomy (PEG) tube placement ([place]) 06/13/2016  . Itchy eyes 11/12/2015  . Benign neoplasm of colon 05/22/2015  . History of colon polyps 05/22/2015  . Lung nodule 05/22/2015  . Abnormal levels of other serum enzymes 05/22/2015  . Gastroesophageal reflux disease 03/17/2015  . Tinea cruris 09/10/2014  . Aspiration pneumonia ([place]) 08/15/2014  . Abscess, prostate 08/15/2014  . Pyelonephritis 08/06/2014  . Nausea & vomiting   . Fever presenting with conditions classified elsewhere 03/16/2014  . Altered mental status 03/16/2014  . Acute bronchiolitis due to unspecified organism 03/11/2014  . Enteritis due to Clostridium difficile 03/11/2014  . Type 2 diabetes mellitus without complication ([place]) 29/92/4268  . Septic shock ([place]) 01/26/2014  . Sepsis ([place]) 01/25/2014  . Acute encephalopathy 01/02/2014  . Acute respiratory failure with hypercapnia ([place]) 01/02/2014  . Chronic anemia 01/02/2014  . Subdural hematoma ([place]) 01/02/2014  . Pain in thoracic spine 12/26/2013  . PEG (percutaneous endoscopic gastrostomy) status ([place]) 10/04/2013  . Rash and nonspecific skin eruption 07/08/2013  . Thrush 07/08/2013  . Heme positive stool 07/07/2013  . Severe sepsis ([place]) 06/23/2013  . E. coli pyelonephritis 06/22/2013  . SIRS (systemic inflammatory response syndrome) ([place]) 06/19/2013  . Leukocytosis 06/19/2013  . Bronchitis 06/19/2013  . Dysphagia 06/19/2013  . Aphasia 06/19/2013  . Other convulsions 08/10/2012  . Iron deficiency anemia 08/10/2012  . Glucocorticoid deficiency ([place]) 08/10/2012  . Febrile illness, acute 06/15/2012  . UTI (lower urinary tract infection)  06/15/2012  . Hypokalemia 11/10/2011  . Diabetic gastroparesis ([place]) 11/08/2011  . Ileus ([place]) 11/08/2011  . Hyponatremia 11/08/2011  . Adrenal insufficiency ([place]) 11/08/2011  . Quadriplegia ([place]) 11/08/2011  . Parkinson disease ([place])   . Seizures ([place])   . Depression     CMP     Component Value Date/Time   NA 134 (A) 09/13/2016   K 3.4 09/13/2016   CL 108 06/26/2016 1424   CO2 26 06/26/2016 1424   GLUCOSE 107 (H) 06/26/2016 1424   BUN 11 09/13/2016   CREATININE 0.5 (A) 09/13/2016   CREATININE 0.66 06/26/2016 1424   CALCIUM 9.1 06/26/2016 1424   PROT 8.3 (H) 06/26/2016 1424   ALBUMIN  3.6 06/26/2016 1424   AST 19 09/13/2016   ALT 11 09/13/2016   ALKPHOS 98 09/13/2016   BILITOT 0.5 06/26/2016 1424   GFRNONAA >60 06/26/2016 1424   GFRAA >60 06/26/2016 1424    Recent Labs  01/11/16 0353  03/26/16 06/26/16 06/26/16 1424 09/13/16  NA 131*  < > 135* 141 141 134*  K 3.7  < > 4.0  --  3.9 3.4  CL 99*  --   --   --  108  --   CO2 25  --   --   --  26  --   GLUCOSE 90  --   --   --  107*  --   BUN 17  < > 23* 22* 22* 11  CREATININE 0.52*  < > 0.5* 0.7 0.66 0.5*  CALCIUM 8.3*  --   --   --  9.1  --   < > = values in this interval not displayed.  Recent Labs  01/11/16 0353  03/26/16 06/26/16 1424 09/13/16  AST 37  < > 28 29 19   ALT 27  < > 18 20 11   ALKPHOS 114  < > 129* 105 98  BILITOT 0.3  --   --  0.5  --   PROT 7.7  --   --  8.3*  --   ALBUMIN 3.3*  --   --  3.6  --   < > = values in this interval not displayed.  Recent Labs  01/11/16 0353  03/26/16 06/26/16 06/26/16 1424 09/13/16  WBC 8.7  < > 6.6 11.3 11.3* 7.9  NEUTROABS 5.8  --   --   --  10.2*  --   HGB 11.0*  < > 12.1*  --  12.2* 12.2*  HCT 32.9*  < > 39*  --  36.9* 38*  MCV 69.6*  --   --   --  71.9*  --   PLT 192  < > 247  --  183 206  < > = values in this interval not displayed.  Recent Labs  01/25/16 01/26/16 09/13/16  CHOL 138 136 127  LDLCALC 62 62 70  TRIG 60 61 79   Lab Results    Component Value Date   MICROALBUR 10.4 12/01/2016   Lab Results  Component Value Date   TSH 1.78 09/13/2016   Lab Results  Component Value Date   HGBA1C 5.2 09/13/2016   Lab Results  Component Value Date   CHOL 127 09/13/2016   HDL 55 09/13/2016   LDLCALC 70 09/13/2016   TRIG 79 09/13/2016    Significant Diagnostic Results in last 30 days:  No results found.  Assessment and Plan  Iron deficiency anemia Most recent hemoglobin is 12.2 which is very stable from prior; plan to continue iron 330 mg in the liquid per tube every morning  Diabetic gastroparesis Posterior problems; continue Reglan 10 mils per tube with meals and at bedtime  Dysphagia Chronic secondary to intracranial bleed; feeding tube forever patient has some difficulty with tube replacements but usually has not had any difficulty with the actual feedings will continue to monitor  Gastroesophageal reflux disease No problems with reflux; continue Reglan 10 mils with meals and at bedtime and Zantac 300 mg daily    Anne D. [name], MD

## 2016-11-19 ENCOUNTER — Encounter: Payer: Self-pay | Admitting: Internal Medicine

## 2016-11-19 NOTE — Assessment & Plan Note (Signed)
Most recent hemoglobin is 12.2 which is very stable from prior; plan to continue iron 330 mg in the liquid per tube every morning

## 2016-11-30 ENCOUNTER — Non-Acute Institutional Stay (SKILLED_NURSING_FACILITY): Payer: Medicare Other | Admitting: Internal Medicine

## 2016-11-30 DIAGNOSIS — K219 Gastro-esophageal reflux disease without esophagitis: Secondary | ICD-10-CM | POA: Diagnosis not present

## 2016-11-30 DIAGNOSIS — E119 Type 2 diabetes mellitus without complications: Secondary | ICD-10-CM

## 2016-11-30 DIAGNOSIS — J3089 Other allergic rhinitis: Secondary | ICD-10-CM

## 2016-11-30 NOTE — Progress Notes (Signed)
Location:  [facility] Room Number: 214D Place of Service:  SNF (31)  [name], MD  Patient Care Team: [name], MD as PCP - General (Internal Medicine)  Extended Emergency Contact Information Primary Emergency Contact: Hosein,[name] Address: [address]          [place], [place] [address] [name] Phone: [phone] Mobile Phone: [phone] Relation: Mother Secondary Emergency Contact: Constance Haw [place] Phone: [phone] Relation: Brother Father: [place], [place] Mobile Phone: [phone]    Allergies: Aspirin; Keflex [cephalexin]; Nsaids; and Penicillins  Chief Complaint  Patient presents with  . Medical Management of Chronic Issues    routine visit    HPI: Patient is 61 y.o. male who Who is being seen for routine issues of diabetes mellitus type 2, GERD, and allergic rhinitis.  Past Medical History:  Diagnosis Date  . Acute encephalopathy 01/02/2014  . Adrenal insufficiency ([place]) 11/08/2011  . Adrenocortical insufficiency ([place])   . Allergic rhinitis   . Anemia    Iron deficient  . Aphasia 06/19/2013  . Cataract   . Clostridium difficile colitis 02/2014  . Constipation   . Contracture of joint of left hand   . Depression   . Diabetic gastroparesis ([place]) 11/08/2011  . Dysphagia 06/19/2013  . Gastroesophageal reflux disease 03/17/2015  . GERD (gastroesophageal reflux disease)   . Glucocorticoid deficiency ([place]) 08/10/2012  . History of colon polyps 05/22/2015  . Ileus ([place]) 11/08/2011  . Impaired verbal communication   . Iron deficiency anemia 08/10/2012  . Lung nodule 05/22/2015  . Nontraumatic chronic subdural hemorrhage ([place])   . Parkinson disease ([place])   . Parkinson's disease ([place])   . PEG (percutaneous endoscopic gastrostomy) status ([place]) 10/04/2013  . Pneumonia 07/2014   aspiration pneumonia  . Pyelonephritis   . Quadriplegia  ([place])   . S/P percutaneous endoscopic gastrostomy (PEG) tube placement ([place])   . Seizures ([place])    last occurance 2015  . SIRS (systemic inflammatory response syndrome) ([place]) 08/06/2014  . Subdural hematoma ([place]) 01/02/2014  . Type 2 diabetes mellitus without complication ([place]) 34/28/7681  . UTI (urinary tract infection) 07/2014  . Vitamin D deficiency 10/16/2016  . Wheelchair bound     Past Surgical History:  Procedure Laterality Date  . COLONOSCOPY    . ESOPHAGOGASTRODUODENOSCOPY    . PEG TUBE PLACEMENT    . PERIPHERALLY INSERTED CENTRAL CATHETER INSERTION    . TONSILLECTOMY  1962    Allergies as of 11/30/2016      Reactions   Aspirin Other (See Comments)   Unknown reaction; "blood doesn't clog too well" per mother.    Keflex [cephalexin]    Rash   Nsaids Other (See Comments)   Unknown reaction   Penicillins Hives, Other (See Comments)   Unknown reaction.  Pt has tolerated cephalosporins in the past.      Medication List       Accurate as of 11/30/16 11:59 PM. Always use your most recent med list.          acetaminophen 325 MG tablet Commonly known as:  TYLENOL Place 650 mg into feeding tube every 6 (six) hours as needed. Fever > 101   baclofen 10 MG tablet Commonly known as:  LIORESAL Place 1 tablet (10 mg total) into feeding tube 3 (three) times daily.   bisacodyl 10 MG suppository Commonly known as:  DULCOLAX Place 10 mg rectally daily  as needed for moderate constipation. If constipation not relieved by milk of magnesia give one 10 mg suppository in 24hours   CENTAMIN Liqd 5 mLs by PEG Tube route daily.   CETAPHIL GENTLE CLEANSER Liqd Apply to face and neck with water and then pat dry daily.   feeding supplement (JEVITY 1.5 CAL) Liqd Place into feeding tube. Initiate Jevity 1.5 cal @@ 58 ml/hr continuous x 20 hr via peg ( hold 1 hr pre/post phenton administration)   ferrous sulfate 220 (44 Fe) MG/5ML solution Place 330 mg into feeding tube See admin  instructions. Give 7.62ml  Per tube every morning   folic acid 1 MG tablet Commonly known as:  FOLVITE 1 mg by PEG Tube route daily.   loratadine 10 MG tablet Commonly known as:  CLARITIN 10 mg by PEG Tube route daily.   magnesium hydroxide 400 MG/5ML suspension Commonly known as:  MILK OF MAGNESIA Take 30 mLs by mouth daily as needed for mild constipation.   metoCLOPramide 10 MG tablet Commonly known as:  REGLAN Place 1 tablet (10 mg total) into feeding tube 4 (four) times daily -  before meals and at bedtime. Via Peg tube   olopatadine 0.1 % ophthalmic solution Commonly known as:  PATANOL Place 1 drop into both eyes every morning.   phenytoin 125 MG/5ML suspension Commonly known as:  DILANTIN Place 4 mLs (100 mg total) into feeding tube 2 (two) times daily.   polyvinyl alcohol 1.4 % ophthalmic solution Commonly known as:  LIQUIFILM TEARS Place 1 drop into both eyes daily at 6 (six) AM.   prednisoLONE 15 MG/5ML Soln Commonly known as:  PRELONE Place into feeding tube. Give 6 mg every morning and 4 mg every evening at 6 pm   promethazine 25 MG tablet Commonly known as:  PHENERGAN Place 25 mg into feeding tube every 6 (six) hours as needed for nausea or vomiting.   RA SALINE ENEMA 19-7 GM/118ML Enem Place 1 each rectally as needed (for constipation).   ranitidine 300 MG tablet Commonly known as:  ZANTAC Place 300 mg into feeding tube daily. PEG tube   sterile water for irrigation Flush 235ml of water into peg tub every 4 hours   Vitamin D (Ergocalciferol) 50000 units Caps capsule Commonly known as:  DRISDOL Take 50,000 Units by mouth every 7 (seven) days.       No orders of the defined types were placed in this encounter.   Immunization History  Administered Date(s) Administered  . Influenza Whole 12/31/2012  . Influenza-Unspecified 12/29/2011, 12/31/2012, 01/07/2014, 12/22/2014, 01/22/2016  . PPD Test 08/22/2008  . Pneumococcal-Unspecified 06/23/2010,  06/23/2015    Social History  Substance Use Topics  . Smoking status: Never Smoker  . Smokeless tobacco: Never Used  . Alcohol use No    Review of Systems  DATA OBTAINED: from Nurse GENERAL:  no fevers, fatigue, appetite changes SKIN: No itching, rash HEENT: No complaint RESPIRATORY: No cough, wheezing, SOB CARDIAC: No chest pain, palpitations, lower extremity edema  GI: No abdominal pain, No N/V/D or constipation, No heartburn or reflux  GU: No dysuria, frequency or urgency, or incontinence  MUSCULOSKELETAL: No unrelieved bone/joint pain NEUROLOGIC: No headache, dizziness  PSYCHIATRIC: No overt anxiety or sadness  Vitals:   11/30/16 1326  BP: 118/73  Pulse: 76  Resp: 18  Temp: (!) 97.1 F (36.2 C)   Body mass index is 21.83 kg/m. Physical Exam  GENERAL APPEARANCE: Alert, Non-conversant, No acute distress  SKIN: No diaphoresis rash HEENT:  Unremarkable RESPIRATORY: Breathing is even, unlabored. Lung sounds are clear   CARDIOVASCULAR: Heart RRR no murmurs, rubs or gallops. No peripheral edema  GASTROINTESTINAL: Abdomen is soft, non-tender, not distended w/ normal bowel sounds.  GENITOURINARY: Bladder non tender, not distended  MUSCULOSKELETAL: No abnormal joints or musculature NEUROLOGIC: Cranial nerves 2-12 grossly intact; quadriplegia with some movement of bilateral upper extremities PSYCHIATRIC: Mood and affect appropriate to situation, no behavioral issues  Patient's physical exam has not changed since prior visit.  Patient Active Problem List   Diagnosis Date Noted  . Vitamin D deficiency 10/16/2016  . Anemia   . GERD (gastroesophageal reflux disease)   . Clostridium difficile colitis   . Adrenocortical insufficiency ([place])   . Cataract 06/13/2016  . Constipation 06/13/2016  . Contracture of joint of left hand 06/13/2016  . Impaired verbal communication 06/13/2016  . Nontraumatic chronic subdural hemorrhage ([place]) 06/13/2016  . Nonverbal 06/13/2016  .  Wheelchair bound 06/13/2016  . Allergic rhinitis 06/13/2016  . S/P percutaneous endoscopic gastrostomy (PEG) tube placement ([place]) 06/13/2016  . Itchy eyes 11/12/2015  . Benign neoplasm of colon 05/22/2015  . History of colon polyps 05/22/2015  . Lung nodule 05/22/2015  . Abnormal levels of other serum enzymes 05/22/2015  . Gastroesophageal reflux disease 03/17/2015  . Tinea cruris 09/10/2014  . Aspiration pneumonia ([place]) 08/15/2014  . Abscess, prostate 08/15/2014  . Pyelonephritis 08/06/2014  . Nausea & vomiting   . Fever presenting with conditions classified elsewhere 03/16/2014  . Altered mental status 03/16/2014  . Acute bronchiolitis due to unspecified organism 03/11/2014  . Enteritis due to Clostridium difficile 03/11/2014  . Type 2 diabetes mellitus without complication ([place]) 73/71/0626  . Septic shock ([place]) 01/26/2014  . Sepsis ([place]) 01/25/2014  . Acute encephalopathy 01/02/2014  . Acute respiratory failure with hypercapnia ([place]) 01/02/2014  . Chronic anemia 01/02/2014  . Subdural hematoma ([facility]) 01/02/2014  . Pain in thoracic spine 12/26/2013  . PEG (percutaneous endoscopic gastrostomy) status ([place]) 10/04/2013  . Rash and nonspecific skin eruption 07/08/2013  . Thrush 07/08/2013  . Heme positive stool 07/07/2013  . Severe sepsis ([place]) 06/23/2013  . E. coli pyelonephritis 06/22/2013  . SIRS (systemic inflammatory response syndrome) ([place]) 06/19/2013  . Leukocytosis 06/19/2013  . Bronchitis 06/19/2013  . Dysphagia 06/19/2013  . Aphasia 06/19/2013  . Other convulsions 08/10/2012  . Iron deficiency anemia 08/10/2012  . Glucocorticoid deficiency ([place]) 08/10/2012  . Febrile illness, acute 06/15/2012  . UTI (lower urinary tract infection) 06/15/2012  . Hypokalemia 11/10/2011  . Diabetic gastroparesis ([place]) 11/08/2011  . Ileus ([place]) 11/08/2011  . Hyponatremia 11/08/2011  . Adrenal insufficiency ([place]) 11/08/2011  . Quadriplegia ([place]) 11/08/2011  . Parkinson disease ([place])    . Seizures ([place])   . Depression     CMP     Component Value Date/Time   NA 134 (A) 09/13/2016   K 3.4 09/13/2016   CL 108 06/26/2016 1424   CO2 26 06/26/2016 1424   GLUCOSE 107 (H) 06/26/2016 1424   BUN 11 09/13/2016   CREATININE 0.5 (A) 09/13/2016   CREATININE 0.66 06/26/2016 1424   CALCIUM 9.1 06/26/2016 1424   PROT 8.3 (H) 06/26/2016 1424   ALBUMIN 3.6 06/26/2016 1424   AST 19 09/13/2016   ALT 11 09/13/2016   ALKPHOS 98 09/13/2016   BILITOT 0.5 06/26/2016 1424   GFRNONAA >60 06/26/2016 1424   GFRAA >60 06/26/2016 1424    Recent Labs  01/11/16 0353  03/26/16 06/26/16 06/26/16 1424 09/13/16  NA 131*  < >  135* 141 141 134*  K 3.7  < > 4.0  --  3.9 3.4  CL 99*  --   --   --  108  --   CO2 25  --   --   --  26  --   GLUCOSE 90  --   --   --  107*  --   BUN 17  < > 23* 22* 22* 11  CREATININE 0.52*  < > 0.5* 0.7 0.66 0.5*  CALCIUM 8.3*  --   --   --  9.1  --   < > = values in this interval not displayed.  Recent Labs  01/11/16 0353  03/26/16 06/26/16 1424 09/13/16  AST 37  < > 28 29 19   ALT 27  < > 18 20 11   ALKPHOS 114  < > 129* 105 98  BILITOT 0.3  --   --  0.5  --   PROT 7.7  --   --  8.3*  --   ALBUMIN 3.3*  --   --  3.6  --   < > = values in this interval not displayed.  Recent Labs  01/11/16 0353  03/26/16 06/26/16 06/26/16 1424 09/13/16  WBC 8.7  < > 6.6 11.3 11.3* 7.9  NEUTROABS 5.8  --   --   --  10.2*  --   HGB 11.0*  < > 12.1*  --  12.2* 12.2*  HCT 32.9*  < > 39*  --  36.9* 38*  MCV 69.6*  --   --   --  71.9*  --   PLT 192  < > 247  --  183 206  < > = values in this interval not displayed.  Recent Labs  01/25/16 01/26/16 09/13/16  CHOL 138 136 127  LDLCALC 62 62 70  TRIG 60 61 79   Lab Results  Component Value Date   MICROALBUR 10.4 12/01/2016   Lab Results  Component Value Date   TSH 1.78 09/13/2016   Lab Results  Component Value Date   HGBA1C 5.2 09/13/2016   Lab Results  Component Value Date   CHOL 127 09/13/2016   HDL  55 09/13/2016   LDLCALC 70 09/13/2016   TRIG 79 09/13/2016    Significant Diagnostic Results in last 30 days:  No results found.  Assessment and Plan  Type 2 diabetes mellitus without complication G2X 5.2, on no medications, diet controlled; patient not on ACE or statin; microalbumin is 10.4; but based on patient's A1c will not start ACE  Gastroesophageal reflux disease Continues with no problems; continue with Reglan 10 mg before meals and daily at bedtime along with Zantac 300 mg by mouth daily  Allergic rhinitis Chronic allergies without any noted problems; plan to continue Claritin 10 mg per tube daily     Jesus Nevills D. [name], MD

## 2016-12-01 DIAGNOSIS — E119 Type 2 diabetes mellitus without complications: Secondary | ICD-10-CM | POA: Diagnosis not present

## 2016-12-01 LAB — MICROALBUMIN, URINE: Microalb, Ur: 10.4

## 2016-12-06 ENCOUNTER — Other Ambulatory Visit: Payer: Self-pay

## 2016-12-10 ENCOUNTER — Encounter: Payer: Self-pay | Admitting: Internal Medicine

## 2016-12-10 NOTE — Assessment & Plan Note (Signed)
Posterior problems; continue Reglan 10 mils per tube with meals and at bedtime

## 2016-12-10 NOTE — Assessment & Plan Note (Signed)
No problems with reflux; continue Reglan 10 mils with meals and at bedtime and Zantac 300 mg daily

## 2016-12-10 NOTE — Assessment & Plan Note (Signed)
Chronic secondary to intracranial bleed; feeding tube forever patient has some difficulty with tube replacements but usually has not had any difficulty with the actual feedings will continue to monitor

## 2016-12-20 DIAGNOSIS — R1110 Vomiting, unspecified: Secondary | ICD-10-CM | POA: Diagnosis not present

## 2016-12-25 ENCOUNTER — Encounter: Payer: Self-pay | Admitting: Internal Medicine

## 2016-12-25 NOTE — Assessment & Plan Note (Signed)
A1c 5.2, on no medications, diet controlled; patient not on ACE or statin; microalbumin is 10.4; but based on patient's A1c will not start ACE

## 2016-12-25 NOTE — Assessment & Plan Note (Signed)
Chronic allergies without any noted problems; plan to continue Claritin 10 mg per tube daily

## 2016-12-25 NOTE — Assessment & Plan Note (Signed)
Continues with no problems; continue with Reglan 10 mg before meals and daily at bedtime along with Zantac 300 mg by mouth daily

## 2016-12-27 ENCOUNTER — Non-Acute Institutional Stay (SKILLED_NURSING_FACILITY): Payer: Medicare Other | Admitting: Internal Medicine

## 2016-12-27 ENCOUNTER — Encounter: Payer: Self-pay | Admitting: Internal Medicine

## 2016-12-27 DIAGNOSIS — E2740 Unspecified adrenocortical insufficiency: Secondary | ICD-10-CM

## 2016-12-27 DIAGNOSIS — G8250 Quadriplegia, unspecified: Secondary | ICD-10-CM

## 2016-12-27 DIAGNOSIS — D508 Other iron deficiency anemias: Secondary | ICD-10-CM | POA: Diagnosis not present

## 2016-12-27 NOTE — Progress Notes (Signed)
Location:  [facility] Room Number [place] of Service:  SNF ([id number])  [name], MD  Patient Care Team: [name], MD as PCP - General (Internal Medicine)  Extended Emergency Contact Information Primary Emergency Contact: Carrero,[name] Address: [address]          [place], [place] [address] [name] Phone: [phone] Mobile Phone: [phone] Relation: Mother Secondary Emergency Contact: Constance Haw [place] Phone: [phone] Relation: Brother Father: Lyn[place], [place] Mobile Phone: [phone]    Allergies: Aspirin; Keflex [cephalexin]; Nsaids; and Penicillins  Chief Complaint  Patient presents with  . Medical Management of Chronic Issues    routine visit    HPI: Patient is 61 y.o. male who Is being seen for routine issues of adrenal insufficiency, quadriplegia, and anemia.  Past Medical History:  Diagnosis Date  . Acute encephalopathy 01/02/2014  . Adrenal insufficiency ([place]) 11/08/2011  . Adrenocortical insufficiency ([place])   . Allergic rhinitis   . Anemia    Iron deficient  . Aphasia 06/19/2013  . Cataract   . Clostridium difficile colitis 02/2014  . Constipation   . Contracture of joint of left hand   . Depression   . Diabetic gastroparesis ([place]) 11/08/2011  . Dysphagia 06/19/2013  . Gastroesophageal reflux disease 03/17/2015  . GERD (gastroesophageal reflux disease)   . Glucocorticoid deficiency ([place]) 08/10/2012  . History of colon polyps 05/22/2015  . Ileus ([place]) 11/08/2011  . Impaired verbal communication   . Iron deficiency anemia 08/10/2012  . Lung nodule 05/22/2015  . Nontraumatic chronic subdural hemorrhage ([place])   . Parkinson disease ([place])   . Parkinson's disease ([place])   . PEG (percutaneous endoscopic gastrostomy) status ([place]) 10/04/2013  . Pneumonia 07/2014   aspiration pneumonia  . Pyelonephritis   . Quadriplegia ([place])   .  S/P percutaneous endoscopic gastrostomy (PEG) tube placement ([place])   . Seizures ([place])    last occurance 2015  . SIRS (systemic inflammatory response syndrome) ([place]) 08/06/2014  . Subdural hematoma ([place]) 01/02/2014  . Type 2 diabetes mellitus without complication ([place]) 50/11/3816  . UTI (urinary tract infection) 07/2014  . Vitamin D deficiency 10/16/2016  . Wheelchair bound     Past Surgical History:  Procedure Laterality Date  . COLONOSCOPY    . ESOPHAGOGASTRODUODENOSCOPY    . PEG TUBE PLACEMENT    . PERIPHERALLY INSERTED CENTRAL CATHETER INSERTION    . TONSILLECTOMY  1962    Allergies as of 12/27/2016      Reactions   Aspirin Other (See Comments)   Unknown reaction; "blood doesn't clog too well" per mother.    Keflex [cephalexin]    Rash   Nsaids Other (See Comments)   Unknown reaction   Penicillins Hives, Other (See Comments)   Unknown reaction.  Pt has tolerated cephalosporins in the past.      Medication List       Accurate as of 12/27/16 11:59 PM. Always use your most recent med list.          acetaminophen 325 MG tablet Commonly known as:  TYLENOL Place 650 mg into feeding tube every 6 (six) hours as needed. Fever > 101   baclofen 10 MG tablet Commonly known as:  LIORESAL Place 1 tablet (10 mg total) into feeding tube 3 (three) times daily.   bisacodyl 10 MG suppository Commonly known as:  DULCOLAX Place 10 mg rectally daily as needed for  moderate constipation. If constipation not relieved by milk of magnesia give one 10 mg suppository in 24hours   CENTAMIN Liqd 5 mLs by PEG Tube route daily.   CETAPHIL GENTLE CLEANSER Liqd Apply to face and neck with water and then pat dry daily.   feeding supplement (JEVITY 1.5 CAL) Liqd Place into feeding tube. Initiate Jevity 1.5 cal @@ 58 ml/hr continuous x 20 hr via peg ( hold 1 hr pre/post phenton administration)   ferrous sulfate 220 (44 Fe) MG/5ML solution Place 330 mg into feeding tube See admin instructions.  Give 7.78ml  Per tube every morning   folic acid 1 MG tablet Commonly known as:  FOLVITE 1 mg by PEG Tube route daily.   loratadine 10 MG tablet Commonly known as:  CLARITIN 10 mg by PEG Tube route daily.   magnesium hydroxide 400 MG/5ML suspension Commonly known as:  MILK OF MAGNESIA Take 30 mLs by mouth daily as needed for mild constipation.   metoCLOPramide 10 MG tablet Commonly known as:  REGLAN Place 1 tablet (10 mg total) into feeding tube 4 (four) times daily -  before meals and at bedtime. Via Peg tube   olopatadine 0.1 % ophthalmic solution Commonly known as:  PATANOL Place 1 drop into both eyes every morning.   phenytoin 125 MG/5ML suspension Commonly known as:  DILANTIN Place 4 mLs (100 mg total) into feeding tube 2 (two) times daily.   polyvinyl alcohol 1.4 % ophthalmic solution Commonly known as:  LIQUIFILM TEARS Place 1 drop into both eyes daily at 6 (six) AM.   prednisoLONE 15 MG/5ML Soln Commonly known as:  PRELONE Place into feeding tube. Give 6 mg every morning and 4 mg every evening at 6 pm   promethazine 25 MG tablet Commonly known as:  PHENERGAN Place 25 mg into feeding tube every 6 (six) hours as needed for nausea or vomiting.   RA SALINE ENEMA 19-7 GM/118ML Enem Place 1 each rectally as needed (for constipation).   ranitidine 300 MG tablet Commonly known as:  ZANTAC Place 300 mg into feeding tube daily. PEG tube   sterile water for irrigation Flush 236ml of water into peg tub every 4 hours   Vitamin D (Ergocalciferol) 50000 units Caps capsule Commonly known as:  DRISDOL Take 50,000 Units by mouth every 7 (seven) days.       No orders of the defined types were placed in this encounter.   Immunization History  Administered Date(s) Administered  . Influenza Whole 12/31/2012  . Influenza-Unspecified 12/29/2011, 12/31/2012, 01/07/2014, 12/22/2014, 01/22/2016  . PPD Test 08/22/2008  . Pneumococcal-Unspecified 06/23/2010, 06/23/2015     Social History  Substance Use Topics  . Smoking status: Never Smoker  . Smokeless tobacco: Never Used  . Alcohol use No    Review of Systems  DATA OBTAINED: from patient, nurse GENERAL:  no fevers, fatigue, appetite changes SKIN: No itching, rash HEENT: No complaint RESPIRATORY: No cough, wheezing, SOB CARDIAC: No chest pain, palpitations, lower extremity edema  GI: No abdominal pain, No N/V/D or constipation, No heartburn or reflux  GU: No dysuria, frequency or urgency, or incontinence  MUSCULOSKELETAL: No unrelieved bone/joint pain NEUROLOGIC: No headache, dizziness  PSYCHIATRIC: No overt anxiety or sadness  Vitals:   12/27/16 1603  BP: 118/73  Pulse: 84  Resp: 20  Temp: (!) 97.1 F (36.2 C)   Body mass index is 23.33 kg/m. Physical Exam  GENERAL APPEARANCE: Alert,  No acute distress  SKIN: No diaphoresis rash HEENT: Unremarkable RESPIRATORY:  Breathing is even, unlabored. Lung sounds are clear   CARDIOVASCULAR: Heart RRR no murmurs, rubs or gallops. No peripheral edema  GASTROINTESTINAL: Abdomen is soft, non-tender, not distended w/ normal bowel sounds.  GENITOURINARY: Bladder non tender, not distended  MUSCULOSKELETAL: Wasting and contractures upper and lower extremities NEUROLOGIC: Cranial nerves 2-12 grossly intact: Quadriplegia with some movement PSYCHIATRIC: Mood and affect appropriate to situation, no behavioral issues  Physical exam is unchanged since prior visit.  Patient Active Problem List   Diagnosis Date Noted  . Vitamin D deficiency 10/16/2016  . Anemia   . GERD (gastroesophageal reflux disease)   . Clostridium difficile colitis   . Adrenocortical insufficiency ([place])   . Cataract 06/13/2016  . Constipation 06/13/2016  . Contracture of joint of left hand 06/13/2016  . Impaired verbal communication 06/13/2016  . Nontraumatic chronic subdural hemorrhage ([place]) 06/13/2016  . Nonverbal 06/13/2016  . Wheelchair bound 06/13/2016  . Allergic  rhinitis 06/13/2016  . S/P percutaneous endoscopic gastrostomy (PEG) tube placement ([place]) 06/13/2016  . Itchy eyes 11/12/2015  . Benign neoplasm of colon 05/22/2015  . History of colon polyps 05/22/2015  . Lung nodule 05/22/2015  . Abnormal levels of other serum enzymes 05/22/2015  . Gastroesophageal reflux disease 03/17/2015  . Tinea cruris 09/10/2014  . Aspiration pneumonia ([place]) 08/15/2014  . Abscess, prostate 08/15/2014  . Pyelonephritis 08/06/2014  . Nausea & vomiting   . Fever presenting with conditions classified elsewhere 03/16/2014  . Altered mental status 03/16/2014  . Acute bronchiolitis due to unspecified organism 03/11/2014  . Enteritis due to Clostridium difficile 03/11/2014  . Type 2 diabetes mellitus without complication ([place]) 03/47/4259  . Septic shock ([place]) 01/26/2014  . Sepsis ([place]) 01/25/2014  . Acute encephalopathy 01/02/2014  . Acute respiratory failure with hypercapnia ([place]) 01/02/2014  . Chronic anemia 01/02/2014  . Subdural hematoma ([place]) 01/02/2014  . Pain in thoracic spine 12/26/2013  . PEG (percutaneous endoscopic gastrostomy) status ([place]) 10/04/2013  . Rash and nonspecific skin eruption 07/08/2013  . Thrush 07/08/2013  . Heme positive stool 07/07/2013  . Severe sepsis ([place]) 06/23/2013  . E. coli pyelonephritis 06/22/2013  . SIRS (systemic inflammatory response syndrome) ([place]) 06/19/2013  . Leukocytosis 06/19/2013  . Bronchitis 06/19/2013  . Dysphagia 06/19/2013  . Aphasia 06/19/2013  . Other convulsions 08/10/2012  . Iron deficiency anemia 08/10/2012  . Glucocorticoid deficiency ([place]) 08/10/2012  . Febrile illness, acute 06/15/2012  . UTI (lower urinary tract infection) 06/15/2012  . Hypokalemia 11/10/2011  . Diabetic gastroparesis ([place]) 11/08/2011  . Ileus ([place]) 11/08/2011  . Hyponatremia 11/08/2011  . Adrenal insufficiency ([place]) 11/08/2011  . Quadriplegia ([place]) 11/08/2011  . Parkinson disease ([place])   . Seizures ([place])   . Depression      CMP     Component Value Date/Time   NA 134 (A) 09/13/2016   K 3.4 09/13/2016   CL 108 06/26/2016 1424   CO2 26 06/26/2016 1424   GLUCOSE 107 (H) 06/26/2016 1424   BUN 11 09/13/2016   CREATININE 0.5 (A) 09/13/2016   CREATININE 0.66 06/26/2016 1424   CALCIUM 9.1 06/26/2016 1424   PROT 8.3 (H) 06/26/2016 1424   ALBUMIN 3.6 06/26/2016 1424   AST 19 09/13/2016   ALT 11 09/13/2016   ALKPHOS 98 09/13/2016   BILITOT 0.5 06/26/2016 1424   GFRNONAA >60 06/26/2016 1424   GFRAA >60 06/26/2016 1424    Recent Labs  01/11/16 0353  03/26/16 06/26/16 06/26/16 1424 09/13/16  NA 131*  < > 135* 141 141 134*  K  3.7  < > 4.0  --  3.9 3.4  CL 99*  --   --   --  108  --   CO2 25  --   --   --  26  --   GLUCOSE 90  --   --   --  107*  --   BUN 17  < > 23* 22* 22* 11  CREATININE 0.52*  < > 0.5* 0.7 0.66 0.5*  CALCIUM 8.3*  --   --   --  9.1  --   < > = values in this interval not displayed.  Recent Labs  01/11/16 0353  03/26/16 06/26/16 1424 09/13/16  AST 37  < > 28 29 19   ALT 27  < > 18 20 11   ALKPHOS 114  < > 129* 105 98  BILITOT 0.3  --   --  0.5  --   PROT 7.7  --   --  8.3*  --   ALBUMIN 3.3*  --   --  3.6  --   < > = values in this interval not displayed.  Recent Labs  01/11/16 0353  03/26/16 06/26/16 06/26/16 1424 09/13/16  WBC 8.7  < > 6.6 11.3 11.3* 7.9  NEUTROABS 5.8  --   --   --  10.2*  --   HGB 11.0*  < > 12.1*  --  12.2* 12.2*  HCT 32.9*  < > 39*  --  36.9* 38*  MCV 69.6*  --   --   --  71.9*  --   PLT 192  < > 247  --  183 206  < > = values in this interval not displayed.  Recent Labs  01/25/16 01/26/16 09/13/16  CHOL 138 136 127  LDLCALC 62 62 70  TRIG 60 61 79   Lab Results  Component Value Date   MICROALBUR 10.4 12/01/2016   Lab Results  Component Value Date   TSH 1.78 09/13/2016   Lab Results  Component Value Date   HGBA1C 5.2 09/13/2016   Lab Results  Component Value Date   CHOL 127 09/13/2016   HDL 55 09/13/2016   LDLCALC 70  09/13/2016   TRIG 79 09/13/2016    Significant Diagnostic Results in last 30 days:  No results found.  Assessment and Plan  Adrenocortical insufficiency (HCC) Chronic and stable; plan to continue prednisone 6 mg per tube every morning and 4 mg every 2 daily at bedtime  Quadriplegia Chronic and stable; 7 movement of bilateral upper extremities; plan to continue baclofen 10 mg 3 times a day per tube  Anemia Stable; continue folate 1 mg by mouth daily and iron 330 mg by mouth daily    Harriett Azar D. [name], MD

## 2017-01-01 ENCOUNTER — Encounter: Payer: Self-pay | Admitting: Internal Medicine

## 2017-01-01 NOTE — Assessment & Plan Note (Signed)
Chronic and stable; 7 movement of bilateral upper extremities; plan to continue baclofen 10 mg 3 times a day per tube

## 2017-01-01 NOTE — Assessment & Plan Note (Signed)
Stable; continue folate 1 mg by mouth daily and iron 330 mg by mouth daily

## 2017-01-01 NOTE — Assessment & Plan Note (Signed)
Chronic and stable; plan to continue prednisone 6 mg per tube every morning and 4 mg every 2 daily at bedtime

## 2017-01-04 DIAGNOSIS — E559 Vitamin D deficiency, unspecified: Secondary | ICD-10-CM | POA: Diagnosis not present

## 2017-01-20 ENCOUNTER — Non-Acute Institutional Stay (SKILLED_NURSING_FACILITY): Payer: Medicare Other | Admitting: Internal Medicine

## 2017-01-20 DIAGNOSIS — L03818 Cellulitis of other sites: Secondary | ICD-10-CM | POA: Diagnosis not present

## 2017-01-30 ENCOUNTER — Encounter: Payer: Self-pay | Admitting: Internal Medicine

## 2017-01-30 DIAGNOSIS — K9423 Gastrostomy malfunction: Secondary | ICD-10-CM | POA: Diagnosis not present

## 2017-01-30 DIAGNOSIS — Z431 Encounter for attention to gastrostomy: Secondary | ICD-10-CM | POA: Diagnosis not present

## 2017-01-30 DIAGNOSIS — R1310 Dysphagia, unspecified: Secondary | ICD-10-CM | POA: Diagnosis not present

## 2017-01-31 DIAGNOSIS — F445 Conversion disorder with seizures or convulsions: Secondary | ICD-10-CM | POA: Diagnosis not present

## 2017-02-05 ENCOUNTER — Encounter: Payer: Self-pay | Admitting: Internal Medicine

## 2017-02-05 NOTE — Progress Notes (Signed)
Location:  [place] [facility] of Service:  SNF (31)skilled nursing facility  [name], MD  Patient Care Team: [name], MD as PCP - General (Internal Medicine)  Extended Emergency Contact Information Primary Emergency Contact: Markos,[name] Address: [address]          [place], [place] [address] [place]net[place] of [place] Phone: [phone] Mobile Phone: [phone] Relation: Mother Secondary Emergency Contact: Constance Haw [place] of [place] Phone: [phone] Relation: Brother Father: Carols, Clemence [place] of [place] Mobile Phone: [phone]    Allergies: Aspirin; Keflex [cephalexin]; Nsaids; and Penicillins  Chief Complaint  Patient presents with  . Acute Visit    HPI: Patient is 61 y.o. male who I am seeing acutely because the wound care nurse has seen some redness and irritation around patient's PEG site. Patient has had no fever nausea vomiting or any other systemic symptom.The PEG site has been irritated because the PEG tube when it was last replaced is smaller than it should be.  Past Medical History:  Diagnosis Date  . Acute encephalopathy 01/02/2014  . Adrenal insufficiency ([place]) 11/08/2011  . Adrenocortical insufficiency ([place])   . Allergic rhinitis   . Anemia    Iron deficient  . Aphasia 06/19/2013  . Cataract   . Clostridium difficile colitis 02/2014  . Constipation   . Contracture of joint of left hand   . Depression   . Diabetic gastroparesis ([place]) 11/08/2011  . Dysphagia 06/19/2013  . Gastroesophageal reflux disease 03/17/2015  . GERD (gastroesophageal reflux disease)   . Glucocorticoid deficiency ([place]) 08/10/2012  . History of colon polyps 05/22/2015  . Ileus ([place]) 11/08/2011  . Impaired verbal communication   . Iron deficiency anemia 08/10/2012  . Lung nodule 05/22/2015  . Nontraumatic chronic subdural hemorrhage ([place])   . Parkinson disease ([place])   . Parkinson's disease  ([place])   . PEG (percutaneous endoscopic gastrostomy) status ([place]) 10/04/2013  . Pneumonia 07/2014   aspiration pneumonia  . Pyelonephritis   . Quadriplegia ([place])   . S/P percutaneous endoscopic gastrostomy (PEG) tube placement ([place])   . Seizures ([place])    last occurance 2015  . SIRS (systemic inflammatory response syndrome) ([place]) 08/06/2014  . Subdural hematoma ([place]) 01/02/2014  . Type 2 diabetes mellitus without complication ([place]) 24/40/1027  . UTI (urinary tract infection) 07/2014  . Vitamin D deficiency 10/16/2016  . Wheelchair bound     Past Surgical History:  Procedure Laterality Date  . COLONOSCOPY    . ESOPHAGOGASTRODUODENOSCOPY    . PEG TUBE PLACEMENT    . PERIPHERALLY INSERTED CENTRAL CATHETER INSERTION    . TONSILLECTOMY  1962    Allergies as of 01/20/2017      Reactions   Aspirin Other (See Comments)   Unknown reaction; "blood doesn't clog too well" per mother.    Keflex [cephalexin]    Rash   Nsaids Other (See Comments)   Unknown reaction   Penicillins Hives, Other (See Comments)   Unknown reaction.  Pt has tolerated cephalosporins in the past.      Medication List        Accurate as of 01/20/17 11:59 PM. Always use your most recent med list.          acetaminophen 325 MG tablet Commonly known as:  TYLENOL Place 650 mg into feeding tube every 6 (six) hours as needed. Fever > 101   baclofen 10 MG tablet Commonly known as:  LIORESAL Place  1 tablet (10 mg total) into feeding tube 3 (three) times daily.   bisacodyl 10 MG suppository Commonly known as:  DULCOLAX Place 10 mg rectally daily as needed for moderate constipation. If constipation not relieved by milk of magnesia give one 10 mg suppository in 24hours   CENTAMIN Liqd 5 mLs by PEG Tube route daily.   CETAPHIL GENTLE CLEANSER Liqd Apply to face and neck with water and then pat dry daily.   feeding supplement (JEVITY 1.5 CAL) Liqd Place into feeding tube. Initiate Jevity 1.5 cal @@ 58 ml/hr  continuous x 20 hr via peg ( hold 1 hr pre/post phenton administration)   ferrous sulfate 220 (44 Fe) MG/5ML solution Place 330 mg into feeding tube See admin instructions. Give 7.48ml  Per tube every morning   folic acid 1 MG tablet Commonly known as:  FOLVITE 1 mg by PEG Tube route daily.   loratadine 10 MG tablet Commonly known as:  CLARITIN 10 mg by PEG Tube route daily.   magnesium hydroxide 400 MG/5ML suspension Commonly known as:  MILK OF MAGNESIA Take 30 mLs by mouth daily as needed for mild constipation.   metoCLOPramide 10 MG tablet Commonly known as:  REGLAN Place 1 tablet (10 mg total) into feeding tube 4 (four) times daily -  before meals and at bedtime. Via Peg tube   olopatadine 0.1 % ophthalmic solution Commonly known as:  PATANOL Place 1 drop into both eyes every morning.   phenytoin 125 MG/5ML suspension Commonly known as:  DILANTIN Place 4 mLs (100 mg total) into feeding tube 2 (two) times daily.   polyvinyl alcohol 1.4 % ophthalmic solution Commonly known as:  LIQUIFILM TEARS Place 1 drop into both eyes daily at 6 (six) AM.   prednisoLONE 15 MG/5ML Soln Commonly known as:  PRELONE Place into feeding tube. Give 6 mg every morning and 4 mg every evening at 6 pm   promethazine 25 MG tablet Commonly known as:  PHENERGAN Place 25 mg into feeding tube every 6 (six) hours as needed for nausea or vomiting.   RA SALINE ENEMA 19-7 GM/118ML Enem Place 1 each rectally as needed (for constipation).   ranitidine 300 MG tablet Commonly known as:  ZANTAC Place 300 mg into feeding tube daily. PEG tube   sterile water for irrigation Flush 250ml of water into peg tub every 4 hours   Vitamin D (Ergocalciferol) 50000 units Caps capsule Commonly known as:  DRISDOL Take 50,000 Units by mouth every 7 (seven) days.       No orders of the defined types were placed in this encounter.   Immunization History  Administered Date(s) Administered  . Influenza Whole  12/31/2012  . Influenza-Unspecified 12/29/2011, 12/31/2012, 12/22/2014, 01/22/2016, 01/05/2017  . PPD Test 08/22/2008  . Pneumococcal-Unspecified 06/23/2010, 06/23/2015    Social History   Tobacco Use  . Smoking status: Never Smoker  . Smokeless tobacco: Never Used  Substance Use Topics  . Alcohol use: No    Review of Systems  DATA OBTAINED: from patient -Limited, patient can write; nursing-as per history of present illness GENERAL:  no fevers, fatigue, appetite changes SKIN: redness around PEG site HEENT: No complaint RESPIRATORY: No cough, wheezing, SOB CARDIAC: No chest pain, palpitations, lower extremity edema  GI: No abdominal pain, No N/V/D or constipation, No heartburn or reflux  GU: No dysuria, frequency or urgency, or incontinence  MUSCULOSKELETAL: No unrelieved bone/joint pain NEUROLOGIC: No headache, dizziness  PSYCHIATRIC: No overt anxiety or sadness  Vitals:  02/05/17 1822  BP: 118/73  Pulse: 84  Resp: 20  Temp: (!) 97.1 F (36.2 C)   Body mass index is 23.33 kg/m. Physical Exam  GENERAL APPEARANCE: Alert,on- conversant, No acute distress  SKIN: No diaphoresis rash HEENT: Unremarkable RESPIRATORY: Breathing is even, unlabored. Lung sounds are clear   CARDIOVASCULAR: Heart RRR no murmurs, rubs or gallops. No peripheral edema  GASTROINTESTINAL: Abdomen is soft, non-tender, not distended w/ normal bowel sounds;PEG-skin there are surrounding is very inflamed and swollen  GENITOURINARY: Bladder non tender, not distended  MUSCULOSKELETAL: No abnormal joints or musculature NEUROLOGIC: Cranial nerves 2-12 grossly intact; paraplegia with some upper extremity movement PSYCHIATRIC: Mood and affect appropriate to situation, no behavioral issues  Patient Active Problem List   Diagnosis Date Noted  . Vitamin D deficiency 10/16/2016  . Anemia   . GERD (gastroesophageal reflux disease)   . Clostridium difficile colitis   . Adrenocortical insufficiency ([place])     . Cataract 06/13/2016  . Constipation 06/13/2016  . Contracture of joint of left hand 06/13/2016  . Impaired verbal communication 06/13/2016  . Nontraumatic chronic subdural hemorrhage ([place]) 06/13/2016  . Nonverbal 06/13/2016  . Wheelchair bound 06/13/2016  . Allergic rhinitis 06/13/2016  . S/P percutaneous endoscopic gastrostomy (PEG) tube placement ([place]) 06/13/2016  . Itchy eyes 11/12/2015  . Benign neoplasm of colon 05/22/2015  . History of colon polyps 05/22/2015  . Lung nodule 05/22/2015  . Abnormal levels of other serum enzymes 05/22/2015  . Gastroesophageal reflux disease 03/17/2015  . Tinea cruris 09/10/2014  . Aspiration pneumonia ([place]) 08/15/2014  . Abscess, prostate 08/15/2014  . Pyelonephritis 08/06/2014  . Nausea & vomiting   . Fever presenting with conditions classified elsewhere 03/16/2014  . Altered mental status 03/16/2014  . Acute bronchiolitis due to unspecified organism 03/11/2014  . Enteritis due to Clostridium difficile 03/11/2014  . Type 2 diabetes mellitus without complication ([place]) 41/32/4401  . Septic shock ([place]) 01/26/2014  . Sepsis ([place]) 01/25/2014  . Acute encephalopathy 01/02/2014  . Acute respiratory failure with hypercapnia ([place]) 01/02/2014  . Chronic anemia 01/02/2014  . Subdural hematoma ([place]) 01/02/2014  . Pain in thoracic spine 12/26/2013  . PEG (percutaneous endoscopic gastrostomy) status ([place]) 10/04/2013  . Rash and nonspecific skin eruption 07/08/2013  . Thrush 07/08/2013  . Heme positive stool 07/07/2013  . Severe sepsis ([place]) 06/23/2013  . E. coli pyelonephritis 06/22/2013  . SIRS (systemic inflammatory response syndrome) ([place]) 06/19/2013  . Leukocytosis 06/19/2013  . Bronchitis 06/19/2013  . Dysphagia 06/19/2013  . Aphasia 06/19/2013  . Other convulsions 08/10/2012  . Iron deficiency anemia 08/10/2012  . Glucocorticoid deficiency ([place]) 08/10/2012  . Febrile illness, acute 06/15/2012  . UTI (lower urinary tract infection)  06/15/2012  . Hypokalemia 11/10/2011  . Diabetic gastroparesis ([place]) 11/08/2011  . Ileus ([place]) 11/08/2011  . Hyponatremia 11/08/2011  . Adrenal insufficiency ([place]) 11/08/2011  . Quadriplegia ([place]) 11/08/2011  . Parkinson disease ([place])   . Seizures ([place])   . Depression     CMP     Component Value Date/Time   NA 134 (A) 09/13/2016   K 3.4 09/13/2016   CL 108 06/26/2016 1424   CO2 26 06/26/2016 1424   GLUCOSE 107 (H) 06/26/2016 1424   BUN 11 09/13/2016   CREATININE 0.5 (A) 09/13/2016   CREATININE 0.66 06/26/2016 1424   CALCIUM 9.1 06/26/2016 1424   PROT 8.3 (H) 06/26/2016 1424   ALBUMIN 3.6 06/26/2016 1424   AST 19 09/13/2016   ALT 11 09/13/2016   ALKPHOS 98  09/13/2016   BILITOT 0.5 06/26/2016 1424   GFRNONAA >60 06/26/2016 1424   GFRAA >60 06/26/2016 1424   Recent Labs    03/26/16 06/26/16 06/26/16 1424 09/13/16  NA 135* 141 141 134*  K 4.0  --  3.9 3.4  CL  --   --  108  --   CO2  --   --  26  --   GLUCOSE  --   --  107*  --   BUN 23* 22* 22* 11  CREATININE 0.5* 0.7 0.66 0.5*  CALCIUM  --   --  9.1  --    Recent Labs    03/26/16 06/26/16 1424 09/13/16  AST 28 29 19   ALT 18 20 11   ALKPHOS 129* 105 98  BILITOT  --  0.5  --   PROT  --  8.3*  --   ALBUMIN  --  3.6  --    Recent Labs    03/26/16 06/26/16 06/26/16 1424 09/13/16  WBC 6.6 11.3 11.3* 7.9  NEUTROABS  --   --  10.2*  --   HGB 12.1*  --  12.2* 12.2*  HCT 39*  --  36.9* 38*  MCV  --   --  71.9*  --   PLT 247  --  183 206   Recent Labs    09/13/16  CHOL 127  LDLCALC 70  TRIG 79   Lab Results  Component Value Date   MICROALBUR 10.4 12/01/2016   Lab Results  Component Value Date   TSH 1.78 09/13/2016   Lab Results  Component Value Date   HGBA1C 5.2 09/13/2016   Lab Results  Component Value Date   CHOL 127 09/13/2016   HDL 55 09/13/2016   LDLCALC 70 09/13/2016   TRIG 79 09/13/2016    Significant Diagnostic Results in last 30 days:  No results found.  Assessment and  Plan  CELLULITIS PEG SITE- have placed patient on Keflex 500 mg per tube 3 times a day for 7 days;need to change the larger tube-if there's 1 facility the wound care nurse can change it, if not patient needs to be sent out, the leakage is certainly contributing to the PEG site irritation   Inocencio Homes, MD

## 2017-02-23 ENCOUNTER — Non-Acute Institutional Stay (SKILLED_NURSING_FACILITY): Payer: Medicare Other | Admitting: Internal Medicine

## 2017-02-23 ENCOUNTER — Encounter: Payer: Self-pay | Admitting: Internal Medicine

## 2017-02-23 DIAGNOSIS — R569 Unspecified convulsions: Secondary | ICD-10-CM

## 2017-02-23 DIAGNOSIS — G8250 Quadriplegia, unspecified: Secondary | ICD-10-CM | POA: Diagnosis not present

## 2017-02-23 DIAGNOSIS — D508 Other iron deficiency anemias: Secondary | ICD-10-CM

## 2017-02-23 NOTE — Progress Notes (Signed)
Location:  [facility] Room Number: 214D Place of Service:  SNF (31)  [name], MD  Patient Care Team: [name], MD as PCP - General (Internal Medicine)  Extended Emergency Contact Information Primary Emergency Contact: Gibas,[name] Address: [address]          [place], [place] [address] [name] of [place] Phone: [phone] Mobile Phone: [phone] Relation: Mother Secondary Emergency Contact: Constance Haw States of [place] Phone: [phone] Relation: Brother Father: Y[place]het, [place] States of [place] Mobile Phone: [phone]    Allergies: Aspirin; Keflex [cephalexin]; Nsaids; and Penicillins  Chief Complaint  Patient presents with  . Medical Management of Chronic Issues    routine visit    HPI: Patient is 61 y.o. male who is being seen for routine issues of seizures, iron deficiency anemia, and quadriplegia.  Past Medical History:  Diagnosis Date  . Acute encephalopathy 01/02/2014  . Adrenal insufficiency ([place]) 11/08/2011  . Adrenocortical insufficiency ([place])   . Allergic rhinitis   . Anemia    Iron deficient  . Aphasia 06/19/2013  . Cataract   . Clostridium difficile colitis 02/2014  . Constipation   . Contracture of joint of left hand   . Depression   . Diabetic gastroparesis ([place]) 11/08/2011  . Dysphagia 06/19/2013  . Gastroesophageal reflux disease 03/17/2015  . GERD (gastroesophageal reflux disease)   . Glucocorticoid deficiency ([place]) 08/10/2012  . History of colon polyps 05/22/2015  . Ileus ([place]) 11/08/2011  . Impaired verbal communication   . Iron deficiency anemia 08/10/2012  . Lung nodule 05/22/2015  . Nontraumatic chronic subdural hemorrhage ([place])   . Parkinson disease ([place])   . Parkinson's disease ([place])   . PEG (percutaneous endoscopic gastrostomy) status ([place]) 10/04/2013  . Pneumonia 07/2014   aspiration pneumonia  . Pyelonephritis   . Quadriplegia ([place])     . S/P percutaneous endoscopic gastrostomy (PEG) tube placement ([place])   . Seizures ([place])    last occurance 2015  . SIRS (systemic inflammatory response syndrome) ([place]) 08/06/2014  . Subdural hematoma ([place]) 01/02/2014  . Type 2 diabetes mellitus without complication ([place]) 48/54/6270  . UTI (urinary tract infection) 07/2014  . Vitamin D deficiency 10/16/2016  . Wheelchair bound     Past Surgical History:  Procedure Laterality Date  . COLONOSCOPY    . ESOPHAGOGASTRODUODENOSCOPY    . PEG TUBE PLACEMENT    . PERIPHERALLY INSERTED CENTRAL CATHETER INSERTION    . TONSILLECTOMY  1962    Allergies as of 02/23/2017      Reactions   Aspirin Other (See Comments)   Unknown reaction; "blood doesn't clog too well" per mother.    Keflex [cephalexin]    Rash   Nsaids Other (See Comments)   Unknown reaction   Penicillins Hives, Other (See Comments)   Unknown reaction.  Pt has tolerated cephalosporins in the past.      Medication List        Accurate as of 02/23/17 11:59 PM. Always use your most recent med list.          acetaminophen 325 MG tablet Commonly known as:  TYLENOL Place 650 mg into feeding tube every 6 (six) hours as needed. Fever > 101   baclofen 10 MG tablet Commonly known as:  LIORESAL Place 1 tablet (10 mg total) into feeding tube 3 (three) times daily.   bisacodyl 10 MG suppository Commonly known as:  DULCOLAX Place 10 mg rectally daily as  needed for moderate constipation. If constipation not relieved by milk of magnesia give one 10 mg suppository in 24hours   CENTAMIN Liqd 5 mLs by PEG Tube route daily.   CETAPHIL GENTLE CLEANSER Liqd Apply to face and neck with water and then pat dry daily.   feeding supplement (JEVITY 1.5 CAL) Liqd Place into feeding tube. Initiate Jevity 1.5 cal @@ 58 ml/hr continuous x 20 hr via peg ( hold 1 hr pre/post phenton administration)   ferrous sulfate 220 (44 Fe) MG/5ML solution Place 330 mg into feeding tube See admin  instructions. Give 7.65ml  Per tube every morning   folic acid 1 MG tablet Commonly known as:  FOLVITE 1 mg by PEG Tube route daily.   loratadine 10 MG tablet Commonly known as:  CLARITIN 10 mg by PEG Tube route daily.   magnesium hydroxide 400 MG/5ML suspension Commonly known as:  MILK OF MAGNESIA Take 30 mLs by mouth daily as needed for mild constipation.   metoCLOPramide 10 MG tablet Commonly known as:  REGLAN Place 1 tablet (10 mg total) into feeding tube 4 (four) times daily -  before meals and at bedtime. Via Peg tube   olopatadine 0.1 % ophthalmic solution Commonly known as:  PATANOL Place 1 drop into both eyes every morning.   phenytoin 50 MG tablet Commonly known as:  DILANTIN Chew 50 mg by mouth. Take 2 tablets crushed into peg twice daily for seizure   polyvinyl alcohol 1.4 % ophthalmic solution Commonly known as:  LIQUIFILM TEARS Place 1 drop into both eyes daily at 6 (six) AM.   prednisoLONE 15 MG/5ML Soln Commonly known as:  PRELONE Place into feeding tube. Give 6 mg every morning and 4 mg every evening at 6 pm   promethazine 25 MG tablet Commonly known as:  PHENERGAN Place 25 mg into feeding tube every 6 (six) hours as needed for nausea or vomiting.   RA SALINE ENEMA 19-7 GM/118ML Enem Place 1 each rectally as needed (for constipation).   ranitidine 300 MG tablet Commonly known as:  ZANTAC Place 300 mg into feeding tube daily. PEG tube   sterile water for irrigation Flush 263ml of water into peg tub every 4 hours       No orders of the defined types were placed in this encounter.   Immunization History  Administered Date(s) Administered  . Influenza Whole 12/31/2012  . Influenza-Unspecified 12/29/2011, 12/31/2012, 12/22/2014, 01/22/2016, 01/05/2017  . PPD Test 08/22/2008  . Pneumococcal-Unspecified 06/23/2010, 06/23/2015    Social History   Tobacco Use  . Smoking status: Never Smoker  . Smokeless tobacco: Never Used  Substance Use  Topics  . Alcohol use: No    Review of Systems  DATA OBTAINED: from nurse GENERAL:  no fevers, fatigue, appetite changes SKIN: No itching, rash HEENT: No complaint RESPIRATORY: No cough, wheezing, SOB CARDIAC: No chest pain, palpitations, lower extremity edema  GI: No abdominal pain, No N/V/D or constipation, No heartburn or reflux  GU: No dysuria, frequency or urgency, or incontinence  MUSCULOSKELETAL: No unrelieved bone/joint pain NEUROLOGIC: No headache, dizziness  PSYCHIATRIC: No overt anxiety or sadness  Vitals:   02/23/17 1212  BP: 114/72  Pulse: 82  Resp: 18  Temp: (!) 97 F (36.1 C)  SpO2: 95%   Body mass index is 22.95 kg/m. Physical Exam  GENERAL APPEARANCE: Alert, non-conversant but can communicate, No acute distress  SKIN: No diaphoresis rash HEENT: Unremarkable RESPIRATORY: Breathing is even, unlabored. Lung sounds are clear  CARDIOVASCULAR: Heart RRR no murmurs, rubs or gallops. No peripheral edema  GASTROINTESTINAL: Abdomen is soft, non-tender, not distended w/ normal bowel sounds.  GENITOURINARY: Bladder non tender, not distended  MUSCULOSKELETAL: Wasting and contractures NEUROLOGIC: Cranial nerves 2-12 grossly intact; quadriplegia PSYCHIATRIC: Mood and affect appropriate to situation, no behavioral issues  Patient Active Problem List   Diagnosis Date Noted  . Vitamin D deficiency 10/16/2016  . Anemia   . GERD (gastroesophageal reflux disease)   . Clostridium difficile colitis   . Adrenocortical insufficiency ([place])   . Cataract 06/13/2016  . Constipation 06/13/2016  . Contracture of joint of left hand 06/13/2016  . Impaired verbal communication 06/13/2016  . Nontraumatic chronic subdural hemorrhage ([place]) 06/13/2016  . Nonverbal 06/13/2016  . Wheelchair bound 06/13/2016  . Allergic rhinitis 06/13/2016  . S/P percutaneous endoscopic gastrostomy (PEG) tube placement ([place]) 06/13/2016  . Itchy eyes 11/12/2015  . Benign neoplasm of colon  05/22/2015  . History of colon polyps 05/22/2015  . Lung nodule 05/22/2015  . Abnormal levels of other serum enzymes 05/22/2015  . Gastroesophageal reflux disease 03/17/2015  . Tinea cruris 09/10/2014  . Aspiration pneumonia ([place]) 08/15/2014  . Abscess, prostate 08/15/2014  . Pyelonephritis 08/06/2014  . Nausea & vomiting   . Fever presenting with conditions classified elsewhere 03/16/2014  . Altered mental status 03/16/2014  . Acute bronchiolitis due to unspecified organism 03/11/2014  . Enteritis due to Clostridium difficile 03/11/2014  . Type 2 diabetes mellitus without complication ([place]) 95/28/4132  . Septic shock ([place]) 01/26/2014  . Sepsis ([place]) 01/25/2014  . Acute encephalopathy 01/02/2014  . Acute respiratory failure with hypercapnia ([place]) 01/02/2014  . Chronic anemia 01/02/2014  . Subdural hematoma ([place]) 01/02/2014  . Pain in thoracic spine 12/26/2013  . PEG (percutaneous endoscopic gastrostomy) status ([place]) 10/04/2013  . Rash and nonspecific skin eruption 07/08/2013  . Thrush 07/08/2013  . Heme positive stool 07/07/2013  . Severe sepsis ([place]) 06/23/2013  . E. coli pyelonephritis 06/22/2013  . SIRS (systemic inflammatory response syndrome) ([place]) 06/19/2013  . Leukocytosis 06/19/2013  . Bronchitis 06/19/2013  . Dysphagia 06/19/2013  . Aphasia 06/19/2013  . Other convulsions 08/10/2012  . Iron deficiency anemia 08/10/2012  . Glucocorticoid deficiency ([place]) 08/10/2012  . Febrile illness, acute 06/15/2012  . UTI (lower urinary tract infection) 06/15/2012  . Hypokalemia 11/10/2011  . Diabetic gastroparesis ([place]) 11/08/2011  . Ileus ([place]) 11/08/2011  . Hyponatremia 11/08/2011  . Adrenal insufficiency ([place]) 11/08/2011  . Quadriplegia ([place]) 11/08/2011  . Parkinson disease ([place])   . Seizures ([place])   . Depression     CMP     Component Value Date/Time   NA 134 (A) 09/13/2016   K 3.4 09/13/2016   CL 108 06/26/2016 1424   CO2 26 06/26/2016 1424   GLUCOSE 107 (H)  06/26/2016 1424   BUN 11 09/13/2016   CREATININE 0.5 (A) 09/13/2016   CREATININE 0.66 06/26/2016 1424   CALCIUM 9.1 06/26/2016 1424   PROT 8.3 (H) 06/26/2016 1424   ALBUMIN 3.6 06/26/2016 1424   AST 19 09/13/2016   ALT 11 09/13/2016   ALKPHOS 98 09/13/2016   BILITOT 0.5 06/26/2016 1424   GFRNONAA >60 06/26/2016 1424   GFRAA >60 06/26/2016 1424   Recent Labs    06/26/16 06/26/16 1424 09/13/16  NA 141 141 134*  K  --  3.9 3.4  CL  --  108  --   CO2  --  26  --   GLUCOSE  --  107*  --  BUN 22* 22* 11  CREATININE 0.7 0.66 0.5*  CALCIUM  --  9.1  --    Recent Labs    06/26/16 1424 09/13/16  AST 29 19  ALT 20 11  ALKPHOS 105 98  BILITOT 0.5  --   PROT 8.3*  --   ALBUMIN 3.6  --    Recent Labs    06/26/16 06/26/16 1424 09/13/16  WBC 11.3 11.3* 7.9  NEUTROABS  --  10.2*  --   HGB  --  12.2* 12.2*  HCT  --  36.9* 38*  MCV  --  71.9*  --   PLT  --  183 206   Recent Labs    09/13/16  CHOL 127  LDLCALC 70  TRIG 79   Lab Results  Component Value Date   MICROALBUR 10.4 12/01/2016   Lab Results  Component Value Date   TSH 1.78 09/13/2016   Lab Results  Component Value Date   HGBA1C 5.2 09/13/2016   Lab Results  Component Value Date   CHOL 127 09/13/2016   HDL 55 09/13/2016   LDLCALC 70 09/13/2016   TRIG 79 09/13/2016    Significant Diagnostic Results in last 30 days:  No results found.  Assessment and Plan  Seizures No reported seizures; continue Dilantin 100 mg per PEG twice a day  Iron deficiency anemia Stable; continue iron liquid 330 mg per tube daily  Quadriplegia Chronic and stable; some movement of bilateral upper extremities; plan to continue baclofen 10 mg per tube 3 times a day    Anne D. [name], MD

## 2017-03-23 ENCOUNTER — Non-Acute Institutional Stay (SKILLED_NURSING_FACILITY): Payer: Medicare Other | Admitting: Internal Medicine

## 2017-03-23 DIAGNOSIS — E1143 Type 2 diabetes mellitus with diabetic autonomic (poly)neuropathy: Secondary | ICD-10-CM

## 2017-03-23 DIAGNOSIS — K3184 Gastroparesis: Secondary | ICD-10-CM

## 2017-03-23 DIAGNOSIS — J3089 Other allergic rhinitis: Secondary | ICD-10-CM

## 2017-03-23 DIAGNOSIS — R1310 Dysphagia, unspecified: Secondary | ICD-10-CM | POA: Diagnosis not present

## 2017-03-23 NOTE — Progress Notes (Signed)
Location:  [facility] Room Number: 214D Place of Service:  SNF (31)  [name], MD  Patient Care Team: [name], MD as PCP - General (Internal Medicine)  Extended Emergency Contact Information Primary Emergency Contact: Seats,[name] Address: [address]          [place], [place] [address] [name] Phone: [phone] Mobile Phone: [phone] Relation: Mother Secondary Emergency Contact: Constance Haw [place] Phone: [phone] Relation: Brother Father: Adin, Laker [place] Mobile Phone: [phone]    Allergies: Aspirin; Keflex [cephalexin]; Nsaids; and Penicillins  Chief Complaint  Patient presents with  . Medical Management of Chronic Issues    routine visit  . Health Maintenance    order written to draw lab for HgbA1c    HPI: Patient is 61 y.o. male who is being seen for routine issues of allergic rhinitis, diabetic gastroparesis, and dysphagia.  Past Medical History:  Diagnosis Date  . Acute encephalopathy 01/02/2014  . Adrenal insufficiency ([place]) 11/08/2011  . Adrenocortical insufficiency ([place])   . Allergic rhinitis   . Anemia    Iron deficient  . Aphasia 06/19/2013  . Cataract   . Clostridium difficile colitis 02/2014  . Constipation   . Contracture of joint of left hand   . Depression   . Diabetic gastroparesis ([place]) 11/08/2011  . Dysphagia 06/19/2013  . Gastroesophageal reflux disease 03/17/2015  . GERD (gastroesophageal reflux disease)   . Glucocorticoid deficiency ([place]) 08/10/2012  . History of colon polyps 05/22/2015  . Ileus ([place]) 11/08/2011  . Impaired verbal communication   . Iron deficiency anemia 08/10/2012  . Lung nodule 05/22/2015  . Nontraumatic chronic subdural hemorrhage ([place])   . Parkinson disease ([place])   . Parkinson's disease ([place])   . PEG (percutaneous endoscopic gastrostomy) status ([place]) 10/04/2013  . Pneumonia  07/2014   aspiration pneumonia  . Pyelonephritis   . Quadriplegia ([place])   . S/P percutaneous endoscopic gastrostomy (PEG) tube placement ([place])   . Seizures ([place])    last occurance 2015  . SIRS (systemic inflammatory response syndrome) ([place]) 08/06/2014  . Subdural hematoma ([place]) 01/02/2014  . Type 2 diabetes mellitus without complication ([place]) 76/16/0737  . UTI (urinary tract infection) 07/2014  . Vitamin D deficiency 10/16/2016  . Wheelchair bound     Past Surgical History:  Procedure Laterality Date  . COLONOSCOPY    . ESOPHAGOGASTRODUODENOSCOPY    . PEG TUBE PLACEMENT    . PERIPHERALLY INSERTED CENTRAL CATHETER INSERTION    . TONSILLECTOMY  1962    Allergies as of 03/23/2017      Reactions   Aspirin Other (See Comments)   Unknown reaction; "blood doesn't clog too well" per mother.    Keflex [cephalexin]    Rash   Nsaids Other (See Comments)   Unknown reaction   Penicillins Hives, Other (See Comments)   Unknown reaction.  Pt has tolerated cephalosporins in the past.      Medication List        Accurate as of 03/23/17 11:59 PM. Always use your most recent med list.          acetaminophen 325 MG tablet Commonly known as:  TYLENOL Place 650 mg into feeding tube every 6 (six) hours as needed. Fever > 101   baclofen 10 MG tablet Commonly known as:  LIORESAL Place 1 tablet (10 mg total) into feeding tube 3 (three) times daily.   bisacodyl 10  MG suppository Commonly known as:  DULCOLAX Place 10 mg rectally daily as needed for moderate constipation. If constipation not relieved by milk of magnesia give one 10 mg suppository in 24hours   CENTAMIN Liqd 5 mLs by PEG Tube route daily.   CETAPHIL GENTLE CLEANSER Liqd Apply to face and neck with water and then pat dry daily.   feeding supplement (JEVITY 1.5 CAL) Liqd Place into feeding tube. Initiate Jevity 1.5 cal @@ 58 ml/hr continuous x 20 hr via peg ( hold 1 hr pre/post phenton administration)   ferrous sulfate  220 (44 Fe) MG/5ML solution Place 330 mg into feeding tube See admin instructions. Give 7.76ml  Per tube every morning   folic acid 1 MG tablet Commonly known as:  FOLVITE 1 mg by PEG Tube route daily.   loratadine 10 MG tablet Commonly known as:  CLARITIN 10 mg by PEG Tube route daily.   magnesium hydroxide 400 MG/5ML suspension Commonly known as:  MILK OF MAGNESIA Take 30 mLs by mouth daily as needed for mild constipation.   metoCLOPramide 10 MG tablet Commonly known as:  REGLAN Place 1 tablet (10 mg total) into feeding tube 4 (four) times daily -  before meals and at bedtime. Via Peg tube   olopatadine 0.1 % ophthalmic solution Commonly known as:  PATANOL Place 1 drop into both eyes every morning.   phenytoin 50 MG tablet Commonly known as:  DILANTIN Chew 50 mg by mouth. Take 2 tablets crushed into peg twice daily for seizure   polyvinyl alcohol 1.4 % ophthalmic solution Commonly known as:  LIQUIFILM TEARS Place 1 drop into both eyes daily at 6 (six) AM.   prednisoLONE 15 MG/5ML Soln Commonly known as:  PRELONE Place into feeding tube. Give 6 mg every morning and 4 mg every evening at 6 pm   promethazine 25 MG tablet Commonly known as:  PHENERGAN Place 25 mg into feeding tube every 6 (six) hours as needed for nausea or vomiting.   RA SALINE ENEMA 19-7 GM/118ML Enem Place 1 each rectally as needed (for constipation).   ranitidine 300 MG tablet Commonly known as:  ZANTAC Place 300 mg into feeding tube daily. PEG tube   sterile water for irrigation Flush 263ml of water into peg tub every 4 hours       No orders of the defined types were placed in this encounter.   Immunization History  Administered Date(s) Administered  . Influenza Whole 12/31/2012  . Influenza-Unspecified 12/29/2011, 12/31/2012, 12/22/2014, 01/22/2016, 01/05/2017  . PPD Test 08/22/2008  . Pneumococcal-Unspecified 06/23/2010, 06/23/2015    Social History   Tobacco Use  . Smoking  status: Never Smoker  . Smokeless tobacco: Never Used  Substance Use Topics  . Alcohol use: No    Review of Systems  DATA OBTAINED: from nurse GENERAL:  no fevers, fatigue, appetite changes SKIN: No itching, rash HEENT: No complaint RESPIRATORY: No cough, wheezing, SOB CARDIAC: No chest pain, palpitations, lower extremity edema  GI: No abdominal pain, No N/V/D or constipation, No heartburn or reflux  GU: No dysuria, frequency or urgency, or incontinence  MUSCULOSKELETAL: No unrelieved bone/joint pain NEUROLOGIC: No headache, dizziness  PSYCHIATRIC: No overt anxiety or sadness  Vitals:   03/23/17 1554  BP: 126/61  Pulse: 80  Resp: 20  Temp: 98 F (36.7 C)   Body mass index is 23.92 kg/m. Physical Exam  GENERAL APPEARANCE: Alert,  No acute distress  SKIN: No diaphoresis rash HEENT: Unremarkable RESPIRATORY: Breathing is even, unlabored.  Lung sounds are clear   CARDIOVASCULAR: Heart RRR no murmurs, rubs or gallops. No peripheral edema  GASTROINTESTINAL: Abdomen is soft, non-tender, not distended w/ normal bowel sounds.  GENITOURINARY: Bladder non tender, not distended  MUSCULOSKELETAL: Wasting and contractures NEUROLOGIC: Cranial nerves 2-12 grossly intact; quadriplegia PSYCHIATRIC: Mood and affect appropriate to situation, no behavioral issues  Patient Active Problem List   Diagnosis Date Noted  . Vitamin D deficiency 10/16/2016  . Anemia   . GERD (gastroesophageal reflux disease)   . Clostridium difficile colitis   . Adrenocortical insufficiency ([place])   . Cataract 06/13/2016  . Constipation 06/13/2016  . Contracture of joint of left hand 06/13/2016  . Impaired verbal communication 06/13/2016  . Nontraumatic chronic subdural hemorrhage ([place]) 06/13/2016  . Nonverbal 06/13/2016  . Wheelchair bound 06/13/2016  . Allergic rhinitis 06/13/2016  . S/P percutaneous endoscopic gastrostomy (PEG) tube placement ([place]) 06/13/2016  . Itchy eyes 11/12/2015  . Benign  neoplasm of colon 05/22/2015  . History of colon polyps 05/22/2015  . Lung nodule 05/22/2015  . Abnormal levels of other serum enzymes 05/22/2015  . Gastroesophageal reflux disease 03/17/2015  . Tinea cruris 09/10/2014  . Aspiration pneumonia ([place]) 08/15/2014  . Abscess, prostate 08/15/2014  . Pyelonephritis 08/06/2014  . Nausea & vomiting   . Fever presenting with conditions classified elsewhere 03/16/2014  . Altered mental status 03/16/2014  . Acute bronchiolitis due to unspecified organism 03/11/2014  . Enteritis due to Clostridium difficile 03/11/2014  . Type 2 diabetes mellitus without complication ([place]) 04/05/3233  . Septic shock ([place]) 01/26/2014  . Sepsis ([place]) 01/25/2014  . Acute encephalopathy 01/02/2014  . Acute respiratory failure with hypercapnia ([place]) 01/02/2014  . Chronic anemia 01/02/2014  . Subdural hematoma ([place]) 01/02/2014  . Pain in thoracic spine 12/26/2013  . PEG (percutaneous endoscopic gastrostomy) status ([place]) 10/04/2013  . Rash and nonspecific skin eruption 07/08/2013  . Thrush 07/08/2013  . Heme positive stool 07/07/2013  . Severe sepsis ([place]) 06/23/2013  . E. coli pyelonephritis 06/22/2013  . SIRS (systemic inflammatory response syndrome) ([place]) 06/19/2013  . Leukocytosis 06/19/2013  . Bronchitis 06/19/2013  . Dysphagia 06/19/2013  . Aphasia 06/19/2013  . Other convulsions 08/10/2012  . Iron deficiency anemia 08/10/2012  . Glucocorticoid deficiency ([place]) 08/10/2012  . Febrile illness, acute 06/15/2012  . UTI (lower urinary tract infection) 06/15/2012  . Hypokalemia 11/10/2011  . Diabetic gastroparesis ([place]) 11/08/2011  . Ileus ([employer]) 11/08/2011  . Hyponatremia 11/08/2011  . Adrenal insufficiency ([place]) 11/08/2011  . Quadriplegia ([place]) 11/08/2011  . Parkinson disease ([place])   . Seizures ([place])   . Depression     CMP     Component Value Date/Time   NA 134 (A) 09/13/2016   K 3.4 09/13/2016   CL 108 06/26/2016 1424   CO2 26 06/26/2016 1424    GLUCOSE 107 (H) 06/26/2016 1424   BUN 11 09/13/2016   CREATININE 0.5 (A) 09/13/2016   CREATININE 0.66 06/26/2016 1424   CALCIUM 9.1 06/26/2016 1424   PROT 8.3 (H) 06/26/2016 1424   ALBUMIN 3.6 06/26/2016 1424   AST 19 09/13/2016   ALT 11 09/13/2016   ALKPHOS 98 09/13/2016   BILITOT 0.5 06/26/2016 1424   GFRNONAA >60 06/26/2016 1424   GFRAA >60 06/26/2016 1424   Recent Labs    06/26/16 06/26/16 1424 09/13/16  NA 141 141 134*  K  --  3.9 3.4  CL  --  108  --   CO2  --  26  --   GLUCOSE  --  107*  --   BUN 22* 22* 11  CREATININE 0.7 0.66 0.5*  CALCIUM  --  9.1  --    Recent Labs    06/26/16 1424 09/13/16  AST 29 19  ALT 20 11  ALKPHOS 105 98  BILITOT 0.5  --   PROT 8.3*  --   ALBUMIN 3.6  --    Recent Labs    06/26/16 06/26/16 1424 09/13/16  WBC 11.3 11.3* 7.9  NEUTROABS  --  10.2*  --   HGB  --  12.2* 12.2*  HCT  --  36.9* 38*  MCV  --  71.9*  --   PLT  --  183 206   Recent Labs    09/13/16  CHOL 127  LDLCALC 70  TRIG 79   Lab Results  Component Value Date   MICROALBUR 10.4 12/01/2016   Lab Results  Component Value Date   TSH 1.78 09/13/2016   Lab Results  Component Value Date   HGBA1C 5.2 09/13/2016   Lab Results  Component Value Date   CHOL 127 09/13/2016   HDL 55 09/13/2016   LDLCALC 70 09/13/2016   TRIG 79 09/13/2016    Significant Diagnostic Results in last 30 days:  No results found.  Assessment and Plan  Allergic rhinitis Known problems; continue Claritin 10 mg per tube daily  Diabetic gastroparesis No reported problems with feeding; continue Reglan 10 mg per tube with meals and at bedtime  Dysphagia Chronic and prominent; continue tube feedings throughout lifetime    Terre Zabriskie D. [name], MD

## 2017-03-24 DIAGNOSIS — E119 Type 2 diabetes mellitus without complications: Secondary | ICD-10-CM | POA: Diagnosis not present

## 2017-03-24 DIAGNOSIS — E1143 Type 2 diabetes mellitus with diabetic autonomic (poly)neuropathy: Secondary | ICD-10-CM | POA: Diagnosis not present

## 2017-03-24 LAB — HEMOGLOBIN A1C: Hemoglobin A1C: 5

## 2017-03-26 ENCOUNTER — Encounter: Payer: Self-pay | Admitting: Internal Medicine

## 2017-03-26 NOTE — Assessment & Plan Note (Signed)
Chronic and prominent; continue tube feedings throughout lifetime

## 2017-03-26 NOTE — Assessment & Plan Note (Signed)
Chronic and stable; some movement of bilateral upper extremities; plan to continue baclofen 10 mg per tube 3 times a day

## 2017-03-26 NOTE — Assessment & Plan Note (Signed)
Stable; continue iron liquid 330 mg per tube daily

## 2017-03-26 NOTE — Assessment & Plan Note (Signed)
No reported seizures; continue Dilantin 100 mg per PEG twice a day

## 2017-03-26 NOTE — Assessment & Plan Note (Signed)
Known problems; continue Claritin 10 mg per tube daily

## 2017-03-26 NOTE — Assessment & Plan Note (Signed)
No reported problems with feeding; continue Reglan 10 mg per tube with meals and at bedtime

## 2017-04-24 ENCOUNTER — Encounter: Payer: Self-pay | Admitting: Internal Medicine

## 2017-04-24 ENCOUNTER — Non-Acute Institutional Stay (SKILLED_NURSING_FACILITY): Payer: Medicare Other | Admitting: Internal Medicine

## 2017-04-24 DIAGNOSIS — K219 Gastro-esophageal reflux disease without esophagitis: Secondary | ICD-10-CM | POA: Diagnosis not present

## 2017-04-24 DIAGNOSIS — E119 Type 2 diabetes mellitus without complications: Secondary | ICD-10-CM | POA: Diagnosis not present

## 2017-04-24 DIAGNOSIS — E2740 Unspecified adrenocortical insufficiency: Secondary | ICD-10-CM | POA: Diagnosis not present

## 2017-04-24 NOTE — Progress Notes (Signed)
Location:  [facility] Room Number: 213D Place of Service:  SNF (31)  [name] Duos, MD  Patient Care Team: [name] Duos, MD as PCP - General (Internal Medicine)  Extended Emergency Contact Information Primary Emergency Contact: Ketter,[name] Address: [address]          [place], [place] [address] [name] Phone: [phone] Mobile Phone: [phone] Relation: Mother Secondary Emergency Contact: Constance Haw [place] Phone: [phone] Relation: Brother Father: Deveion, Denz [place] Mobile Phone: [phone]    Allergies: Aspirin; Keflex [cephalexin]; Nsaids; and Penicillins  Chief Complaint  Patient presents with  . Medical Management of Chronic Issues    Routine Visit    HPI: Patient is 62 y.o. male who is being seen for routine issues of GERD, diabetes mellitus type 2, and adrenal insufficiency.  Past Medical History:  Diagnosis Date  . Acute encephalopathy 01/02/2014  . Adrenal insufficiency ([place]) 11/08/2011  . Adrenocortical insufficiency ([place])   . Allergic rhinitis   . Anemia    Iron deficient  . Aphasia 06/19/2013  . Cataract   . Clostridium difficile colitis 02/2014  . Constipation   . Contracture of joint of left hand   . Depression   . Diabetic gastroparesis ([place]) 11/08/2011  . Dysphagia 06/19/2013  . Gastroesophageal reflux disease 03/17/2015  . GERD (gastroesophageal reflux disease)   . Glucocorticoid deficiency ([place]) 08/10/2012  . History of colon polyps 05/22/2015  . Ileus ([place]) 11/08/2011  . Impaired verbal communication   . Iron deficiency anemia 08/10/2012  . Lung nodule 05/22/2015  . Nontraumatic chronic subdural hemorrhage ([place])   . Parkinson disease ([place])   . Parkinson's disease ([place])   . PEG (percutaneous endoscopic gastrostomy) status ([place]) 10/04/2013  . Pneumonia 07/2014   aspiration pneumonia  . Pyelonephritis   . Quadriplegia  ([place])   . S/P percutaneous endoscopic gastrostomy (PEG) tube placement ([place])   . Seizures ([place])    last occurance 2015  . SIRS (systemic inflammatory response syndrome) ([place]) 08/06/2014  . Subdural hematoma ([place]) 01/02/2014  . Type 2 diabetes mellitus without complication ([place]) 53/61/4431  . UTI (urinary tract infection) 07/2014  . Vitamin D deficiency 10/16/2016  . Wheelchair bound     Past Surgical History:  Procedure Laterality Date  . COLONOSCOPY    . ESOPHAGOGASTRODUODENOSCOPY    . PEG TUBE PLACEMENT    . PERIPHERALLY INSERTED CENTRAL CATHETER INSERTION    . TONSILLECTOMY  1962    Allergies as of 04/24/2017      Reactions   Aspirin Other (See Comments)   Unknown reaction; "blood doesn't clog too well" per mother.    Keflex [cephalexin]    Rash   Nsaids Other (See Comments)   Unknown reaction   Penicillins Hives, Other (See Comments)   Unknown reaction.  Pt has tolerated cephalosporins in the past.      Medication List        Accurate as of 04/24/17 11:59 PM. Always use your most recent med list.          acetaminophen 325 MG tablet Commonly known as:  TYLENOL Place 650 mg into feeding tube every 6 (six) hours as needed. Fever > 101   baclofen 10 MG tablet Commonly known as:  LIORESAL Place 1 tablet (10 mg total) into feeding tube 3 (three) times daily.   bisacodyl 10 MG suppository Commonly known as:  DULCOLAX Place 10 mg rectally daily  as needed for moderate constipation. If constipation not relieved by milk of magnesia give one 10 mg suppository in 24hours   CENTAMIN Liqd 5 mLs by PEG Tube route daily.   CETAPHIL GENTLE CLEANSER Liqd Apply to face and neck with water and then pat dry daily.   feeding supplement (JEVITY 1.5 CAL) Liqd Place into feeding tube. Initiate Jevity 1.5 cal @@ 58 ml/hr continuous x 20 hr via peg ( hold 1 hr pre/post phenton administration)   ferrous sulfate 220 (44 Fe) MG/5ML solution Place 330 mg into feeding tube See admin  instructions. Give 7.49ml  Per tube every morning   folic acid 1 MG tablet Commonly known as:  FOLVITE 1 mg by PEG Tube route daily.   loratadine 10 MG tablet Commonly known as:  CLARITIN 10 mg by PEG Tube route daily.   magnesium hydroxide 400 MG/5ML suspension Commonly known as:  MILK OF MAGNESIA Take 30 mLs by mouth daily as needed for mild constipation.   metoCLOPramide 10 MG tablet Commonly known as:  REGLAN Place 1 tablet (10 mg total) into feeding tube 4 (four) times daily -  before meals and at bedtime. Via Peg tube   olopatadine 0.1 % ophthalmic solution Commonly known as:  PATANOL Place 1 drop into both eyes every morning.   phenytoin 50 MG tablet Commonly known as:  DILANTIN Chew 50 mg by mouth. Take 2 tablets crushed into peg twice daily for seizure   polyvinyl alcohol 1.4 % ophthalmic solution Commonly known as:  LIQUIFILM TEARS Place 1 drop into both eyes daily at 6 (six) AM.   prednisoLONE 15 MG/5ML Soln Commonly known as:  PRELONE Place into feeding tube. Give 6 mg every morning and 4 mg every evening at 6 pm   promethazine 25 MG tablet Commonly known as:  PHENERGAN Place 25 mg into feeding tube every 6 (six) hours as needed for nausea or vomiting.   RA SALINE ENEMA 19-7 GM/118ML Enem Place 1 each rectally as needed (for constipation).   ranitidine 300 MG tablet Commonly known as:  ZANTAC Place 300 mg into feeding tube daily. PEG tube   sterile water for irrigation Flush 251ml of water into peg tub every 4 hours       No orders of the defined types were placed in this encounter.   Immunization History  Administered Date(s) Administered  . Influenza Whole 12/31/2012  . Influenza-Unspecified 12/29/2011, 12/31/2012, 12/22/2014, 01/22/2016, 01/05/2017  . PPD Test 08/22/2008  . Pneumococcal-Unspecified 06/23/2010, 06/23/2015    Social History   Tobacco Use  . Smoking status: Never Smoker  . Smokeless tobacco: Never Used  Substance Use  Topics  . Alcohol use: No    Review of Systems  DATA OBTAINED: from nurse GENERAL:  no fevers, fatigue, appetite changes SKIN: No itching, rash HEENT: No complaint RESPIRATORY: No cough, wheezing, SOB CARDIAC: No chest pain, palpitations, lower extremity edema  GI: No abdominal pain, No N/V/D or constipation, No heartburn or reflux  GU: No dysuria, frequency or urgency, or incontinence  MUSCULOSKELETAL: No unrelieved bone/joint pain NEUROLOGIC: No headache, dizziness  PSYCHIATRIC: No overt anxiety or sadness  Vitals:   04/24/17 1106  BP: 126/84  Pulse: 80  Resp: 18  Temp: (!) 97.3 F (36.3 C)  SpO2: 97%   Body mass index is 23.63 kg/m. Physical Exam  GENERAL APPEARANCE: Alert, non-conversant conversant, No acute distress  SKIN: No diaphoresis rash HEENT: Unremarkable RESPIRATORY: Breathing is even, unlabored. Lung sounds are clear   CARDIOVASCULAR:  Heart RRR no murmurs, rubs or gallops. No peripheral edema  GASTROINTESTINAL: Abdomen is soft, non-tender, not distended w/ normal bowel sounds; PEG tube  GENITOURINARY: Bladder non tender, not distended  MUSCULOSKELETAL: No abnormal joints or musculature NEUROLOGIC: Cranial nerves 2-12 grossly intact; quadriplegia with some movement of  bilateral upper extremities PSYCHIATRIC: Mood and affect appropriate to situation, no behavioral issues  Patient Active Problem List   Diagnosis Date Noted  . Vitamin D deficiency 10/16/2016  . Anemia   . GERD (gastroesophageal reflux disease)   . Clostridium difficile colitis   . Adrenocortical insufficiency ([place])   . Cataract 06/13/2016  . Constipation 06/13/2016  . Contracture of joint of left hand 06/13/2016  . Impaired verbal communication 06/13/2016  . Nontraumatic chronic subdural hemorrhage ([place]) 06/13/2016  . Nonverbal 06/13/2016  . Wheelchair bound 06/13/2016  . Allergic rhinitis 06/13/2016  . S/P percutaneous endoscopic gastrostomy (PEG) tube placement ([place]) 06/13/2016    . Itchy eyes 11/12/2015  . Benign neoplasm of colon 05/22/2015  . History of colon polyps 05/22/2015  . Lung nodule 05/22/2015  . Abnormal levels of other serum enzymes 05/22/2015  . Gastroesophageal reflux disease 03/17/2015  . Tinea cruris 09/10/2014  . Aspiration pneumonia ([place]) 08/15/2014  . Abscess, prostate 08/15/2014  . Pyelonephritis 08/06/2014  . Nausea & vomiting   . Fever presenting with conditions classified elsewhere 03/16/2014  . Altered mental status 03/16/2014  . Acute bronchiolitis due to unspecified organism 03/11/2014  . Enteritis due to Clostridium difficile 03/11/2014  . Type 2 diabetes mellitus without complication ([place]) 63/14/9702  . Septic shock ([place]) 01/26/2014  . Sepsis ([place]) 01/25/2014  . Acute encephalopathy 01/02/2014  . Acute respiratory failure with hypercapnia ([place]) 01/02/2014  . Chronic anemia 01/02/2014  . Subdural hematoma ([place]) 01/02/2014  . Pain in thoracic spine 12/26/2013  . PEG (percutaneous endoscopic gastrostomy) status ([place]) 10/04/2013  . Rash and nonspecific skin eruption 07/08/2013  . Thrush 07/08/2013  . Heme positive stool 07/07/2013  . Severe sepsis ([place]) 06/23/2013  . E. coli pyelonephritis 06/22/2013  . SIRS (systemic inflammatory response syndrome) ([place]) 06/19/2013  . Leukocytosis 06/19/2013  . Bronchitis 06/19/2013  . Dysphagia 06/19/2013  . Aphasia 06/19/2013  . Other convulsions 08/10/2012  . Iron deficiency anemia 08/10/2012  . Glucocorticoid deficiency ([place]) 08/10/2012  . Febrile illness, acute 06/15/2012  . UTI (lower urinary tract infection) 06/15/2012  . Hypokalemia 11/10/2011  . Diabetic gastroparesis ([place]) 11/08/2011  . Ileus ([place]) 11/08/2011  . Hyponatremia 11/08/2011  . Adrenal insufficiency ([place]) 11/08/2011  . Quadriplegia ([place]) 11/08/2011  . Parkinson disease ([place])   . Seizures ([place])   . Depression     CMP     Component Value Date/Time   NA 134 (A) 09/13/2016   K 3.4 09/13/2016   CL 108  06/26/2016 1424   CO2 26 06/26/2016 1424   GLUCOSE 107 (H) 06/26/2016 1424   BUN 11 09/13/2016   CREATININE 0.5 (A) 09/13/2016   CREATININE 0.66 06/26/2016 1424   CALCIUM 9.1 06/26/2016 1424   PROT 8.3 (H) 06/26/2016 1424   ALBUMIN 3.6 06/26/2016 1424   AST 19 09/13/2016   ALT 11 09/13/2016   ALKPHOS 98 09/13/2016   BILITOT 0.5 06/26/2016 1424   GFRNONAA >60 06/26/2016 1424   GFRAA >60 06/26/2016 1424   Recent Labs    06/26/16 06/26/16 1424 09/13/16  NA 141 141 134*  K  --  3.9 3.4  CL  --  108  --   CO2  --  26  --  GLUCOSE  --  107*  --   BUN 22* 22* 11  CREATININE 0.7 0.66 0.5*  CALCIUM  --  9.1  --    Recent Labs    06/26/16 1424 09/13/16  AST 29 19  ALT 20 11  ALKPHOS 105 98  BILITOT 0.5  --   PROT 8.3*  --   ALBUMIN 3.6  --    Recent Labs    06/26/16 06/26/16 1424 09/13/16  WBC 11.3 11.3* 7.9  NEUTROABS  --  10.2*  --   HGB  --  12.2* 12.2*  HCT  --  36.9* 38*  MCV  --  71.9*  --   PLT  --  183 206   Recent Labs    09/13/16  CHOL 127  LDLCALC 70  TRIG 79   Lab Results  Component Value Date   MICROALBUR 10.4 12/01/2016   Lab Results  Component Value Date   TSH 1.78 09/13/2016   Lab Results  Component Value Date   HGBA1C 5.0 03/24/2017   Lab Results  Component Value Date   CHOL 127 09/13/2016   HDL 55 09/13/2016   LDLCALC 70 09/13/2016   TRIG 79 09/13/2016    Significant Diagnostic Results in last 30 days:  No results found.  Assessment and Plan  Gastroesophageal reflux disease The problem; continue 10 mg of Reglan with each meal and at bedtime and Zantac 300 mg by mouth daily at bedtime  Adrenocortical insufficiency (HCC) Stable and chronic; continue prednisone 6 mg by mouth every morning and 4 mg by mouth every afternoon  Type 2 diabetes mellitus without complication Was recently A1c 5.0 on no medications: Diet controlled; not on ace or statin     [name], MD

## 2017-04-29 ENCOUNTER — Encounter: Payer: Self-pay | Admitting: Internal Medicine

## 2017-04-29 NOTE — Assessment & Plan Note (Signed)
Was recently A1c 5.0 on no medications: Diet controlled; not on ace or statin

## 2017-04-29 NOTE — Assessment & Plan Note (Signed)
Stable and chronic; continue prednisone 6 mg by mouth every morning and 4 mg by mouth every afternoon

## 2017-04-29 NOTE — Assessment & Plan Note (Signed)
The problem; continue 10 mg of Reglan with each meal and at bedtime and Zantac 300 mg by mouth daily at bedtime

## 2017-05-03 DIAGNOSIS — R1312 Dysphagia, oropharyngeal phase: Secondary | ICD-10-CM | POA: Diagnosis not present

## 2017-05-03 DIAGNOSIS — R488 Other symbolic dysfunctions: Secondary | ICD-10-CM | POA: Diagnosis not present

## 2017-05-03 DIAGNOSIS — G8250 Quadriplegia, unspecified: Secondary | ICD-10-CM | POA: Diagnosis not present

## 2017-05-04 DIAGNOSIS — R1312 Dysphagia, oropharyngeal phase: Secondary | ICD-10-CM | POA: Diagnosis not present

## 2017-05-04 DIAGNOSIS — G8250 Quadriplegia, unspecified: Secondary | ICD-10-CM | POA: Diagnosis not present

## 2017-05-04 DIAGNOSIS — R488 Other symbolic dysfunctions: Secondary | ICD-10-CM | POA: Diagnosis not present

## 2017-05-05 DIAGNOSIS — R1312 Dysphagia, oropharyngeal phase: Secondary | ICD-10-CM | POA: Diagnosis not present

## 2017-05-05 DIAGNOSIS — R488 Other symbolic dysfunctions: Secondary | ICD-10-CM | POA: Diagnosis not present

## 2017-05-05 DIAGNOSIS — G8250 Quadriplegia, unspecified: Secondary | ICD-10-CM | POA: Diagnosis not present

## 2017-05-06 DIAGNOSIS — G8250 Quadriplegia, unspecified: Secondary | ICD-10-CM | POA: Diagnosis not present

## 2017-05-06 DIAGNOSIS — R488 Other symbolic dysfunctions: Secondary | ICD-10-CM | POA: Diagnosis not present

## 2017-05-06 DIAGNOSIS — R1312 Dysphagia, oropharyngeal phase: Secondary | ICD-10-CM | POA: Diagnosis not present

## 2017-05-07 DIAGNOSIS — R488 Other symbolic dysfunctions: Secondary | ICD-10-CM | POA: Diagnosis not present

## 2017-05-07 DIAGNOSIS — R1312 Dysphagia, oropharyngeal phase: Secondary | ICD-10-CM | POA: Diagnosis not present

## 2017-05-07 DIAGNOSIS — G8250 Quadriplegia, unspecified: Secondary | ICD-10-CM | POA: Diagnosis not present

## 2017-05-10 DIAGNOSIS — R488 Other symbolic dysfunctions: Secondary | ICD-10-CM | POA: Diagnosis not present

## 2017-05-10 DIAGNOSIS — G8250 Quadriplegia, unspecified: Secondary | ICD-10-CM | POA: Diagnosis not present

## 2017-05-10 DIAGNOSIS — R1312 Dysphagia, oropharyngeal phase: Secondary | ICD-10-CM | POA: Diagnosis not present

## 2017-05-11 DIAGNOSIS — R1312 Dysphagia, oropharyngeal phase: Secondary | ICD-10-CM | POA: Diagnosis not present

## 2017-05-11 DIAGNOSIS — G8250 Quadriplegia, unspecified: Secondary | ICD-10-CM | POA: Diagnosis not present

## 2017-05-11 DIAGNOSIS — I739 Peripheral vascular disease, unspecified: Secondary | ICD-10-CM | POA: Diagnosis not present

## 2017-05-11 DIAGNOSIS — R262 Difficulty in walking, not elsewhere classified: Secondary | ICD-10-CM | POA: Diagnosis not present

## 2017-05-11 DIAGNOSIS — R488 Other symbolic dysfunctions: Secondary | ICD-10-CM | POA: Diagnosis not present

## 2017-05-11 DIAGNOSIS — B351 Tinea unguium: Secondary | ICD-10-CM | POA: Diagnosis not present

## 2017-05-12 DIAGNOSIS — R488 Other symbolic dysfunctions: Secondary | ICD-10-CM | POA: Diagnosis not present

## 2017-05-12 DIAGNOSIS — G8250 Quadriplegia, unspecified: Secondary | ICD-10-CM | POA: Diagnosis not present

## 2017-05-12 DIAGNOSIS — R1312 Dysphagia, oropharyngeal phase: Secondary | ICD-10-CM | POA: Diagnosis not present

## 2017-05-14 DIAGNOSIS — R488 Other symbolic dysfunctions: Secondary | ICD-10-CM | POA: Diagnosis not present

## 2017-05-14 DIAGNOSIS — G8250 Quadriplegia, unspecified: Secondary | ICD-10-CM | POA: Diagnosis not present

## 2017-05-14 DIAGNOSIS — R1312 Dysphagia, oropharyngeal phase: Secondary | ICD-10-CM | POA: Diagnosis not present

## 2017-05-15 DIAGNOSIS — G8250 Quadriplegia, unspecified: Secondary | ICD-10-CM | POA: Diagnosis not present

## 2017-05-15 DIAGNOSIS — R488 Other symbolic dysfunctions: Secondary | ICD-10-CM | POA: Diagnosis not present

## 2017-05-15 DIAGNOSIS — R1312 Dysphagia, oropharyngeal phase: Secondary | ICD-10-CM | POA: Diagnosis not present

## 2017-05-17 DIAGNOSIS — R1312 Dysphagia, oropharyngeal phase: Secondary | ICD-10-CM | POA: Diagnosis not present

## 2017-05-17 DIAGNOSIS — R488 Other symbolic dysfunctions: Secondary | ICD-10-CM | POA: Diagnosis not present

## 2017-05-17 DIAGNOSIS — G8250 Quadriplegia, unspecified: Secondary | ICD-10-CM | POA: Diagnosis not present

## 2017-05-18 DIAGNOSIS — G8250 Quadriplegia, unspecified: Secondary | ICD-10-CM | POA: Diagnosis not present

## 2017-05-18 DIAGNOSIS — R488 Other symbolic dysfunctions: Secondary | ICD-10-CM | POA: Diagnosis not present

## 2017-05-18 DIAGNOSIS — R1312 Dysphagia, oropharyngeal phase: Secondary | ICD-10-CM | POA: Diagnosis not present

## 2017-05-20 DIAGNOSIS — R488 Other symbolic dysfunctions: Secondary | ICD-10-CM | POA: Diagnosis not present

## 2017-05-20 DIAGNOSIS — G8250 Quadriplegia, unspecified: Secondary | ICD-10-CM | POA: Diagnosis not present

## 2017-05-20 DIAGNOSIS — R1312 Dysphagia, oropharyngeal phase: Secondary | ICD-10-CM | POA: Diagnosis not present

## 2017-05-21 DIAGNOSIS — R488 Other symbolic dysfunctions: Secondary | ICD-10-CM | POA: Diagnosis not present

## 2017-05-21 DIAGNOSIS — R1312 Dysphagia, oropharyngeal phase: Secondary | ICD-10-CM | POA: Diagnosis not present

## 2017-05-21 DIAGNOSIS — G8250 Quadriplegia, unspecified: Secondary | ICD-10-CM | POA: Diagnosis not present

## 2017-05-22 ENCOUNTER — Non-Acute Institutional Stay (SKILLED_NURSING_FACILITY): Payer: Medicare Other | Admitting: Internal Medicine

## 2017-05-22 ENCOUNTER — Encounter: Payer: Self-pay | Admitting: Internal Medicine

## 2017-05-22 DIAGNOSIS — G8250 Quadriplegia, unspecified: Secondary | ICD-10-CM | POA: Diagnosis not present

## 2017-05-22 DIAGNOSIS — R569 Unspecified convulsions: Secondary | ICD-10-CM

## 2017-05-22 DIAGNOSIS — R4701 Aphasia: Secondary | ICD-10-CM | POA: Diagnosis not present

## 2017-05-22 DIAGNOSIS — D508 Other iron deficiency anemias: Secondary | ICD-10-CM

## 2017-05-22 DIAGNOSIS — R488 Other symbolic dysfunctions: Secondary | ICD-10-CM | POA: Diagnosis not present

## 2017-05-22 DIAGNOSIS — R1312 Dysphagia, oropharyngeal phase: Secondary | ICD-10-CM | POA: Diagnosis not present

## 2017-05-22 NOTE — Progress Notes (Signed)
Location:  [facility] Room Number: 214D Place of Service:  SNF (31)  [name], MD  Patient Care Team: [name], MD as PCP - General (Internal Medicine)  Extended Emergency Contact Information Primary Emergency Contact: Empey,[name] Address: [address]          [place], [place] [address] [name] Phone: [phone] Mobile Phone: [phone] Relation: Mother Secondary Emergency Contact: Constance Haw [place] Phone: [phone] Relation: Brother Father: [place], [place] Mobile Phone: [phone]    Allergies: Aspirin; Keflex [cephalexin]; Nsaids; and Penicillins  Chief Complaint  Patient presents with  . Medical Management of Chronic Issues    Routine Visit    HPI: Patient is 62 y.o. male who is being seen for routine issues of iron deficiency anemia, elevation, and seizures.  Past Medical History:  Diagnosis Date  . Acute encephalopathy 01/02/2014  . Adrenal insufficiency ([place]) 11/08/2011  . Adrenocortical insufficiency ([place])   . Allergic rhinitis   . Anemia    Iron deficient  . Aphasia 06/19/2013  . Cataract   . Clostridium difficile colitis 02/2014  . Constipation   . Contracture of joint of left hand   . Depression   . Diabetic gastroparesis ([facility]) 11/08/2011  . Dysphagia 06/19/2013  . Gastroesophageal reflux disease 03/17/2015  . GERD (gastroesophageal reflux disease)   . Glucocorticoid deficiency ([place]) 08/10/2012  . History of colon polyps 05/22/2015  . Ileus ([place]) 11/08/2011  . Impaired verbal communication   . Iron deficiency anemia 08/10/2012  . Lung nodule 05/22/2015  . Nontraumatic chronic subdural hemorrhage ([place])   . Parkinson disease ([place])   . Parkinson's disease ([place])   . PEG (percutaneous endoscopic gastrostomy) status ([place]) 10/04/2013  . Pneumonia 07/2014   aspiration pneumonia  . Pyelonephritis   . Quadriplegia ([place])   .  S/P percutaneous endoscopic gastrostomy (PEG) tube placement ([place])   . Seizures ([place])    last occurance 2015  . SIRS (systemic inflammatory response syndrome) ([place]) 08/06/2014  . Subdural hematoma ([place]) 01/02/2014  . Type 2 diabetes mellitus without complication ([place]) 58/52/7782  . UTI (urinary tract infection) 07/2014  . Vitamin D deficiency 10/16/2016  . Wheelchair bound     Past Surgical History:  Procedure Laterality Date  . COLONOSCOPY    . ESOPHAGOGASTRODUODENOSCOPY    . PEG TUBE PLACEMENT    . PERIPHERALLY INSERTED CENTRAL CATHETER INSERTION    . TONSILLECTOMY  1962    Allergies as of 05/22/2017      Reactions   Aspirin Other (See Comments)   Unknown reaction; "blood doesn't clog too well" per mother.    Keflex [cephalexin]    Rash   Nsaids Other (See Comments)   Unknown reaction   Penicillins Hives, Other (See Comments)   Unknown reaction.  Pt has tolerated cephalosporins in the past.      Medication List        Accurate as of 05/22/17 11:59 PM. Always use your most recent med list.          acetaminophen 325 MG tablet Commonly known as:  TYLENOL Place 650 mg into feeding tube every 6 (six) hours as needed. Fever > 101   baclofen 10 MG tablet Commonly known as:  LIORESAL Place 1 tablet (10 mg total) into feeding tube 3 (three) times daily.   bisacodyl 10 MG suppository Commonly known as:  DULCOLAX Place 10 mg rectally daily as needed  for moderate constipation. If constipation not relieved by milk of magnesia give one 10 mg suppository in 24hours   CENTAMIN Liqd 5 mLs by PEG Tube route daily.   CETAPHIL GENTLE CLEANSER Liqd Apply to face and neck with water and then pat dry daily.   feeding supplement (JEVITY 1.5 CAL) Liqd Place into feeding tube. Initiate Jevity 1.5 cal @@ 58 ml/hr continuous x 20 hr via peg ( hold 1 hr pre/post phenton administration)   ferrous sulfate 220 (44 Fe) MG/5ML solution Place 330 mg into feeding tube See admin instructions.  Give 7.59ml  Per tube every morning   folic acid 1 MG tablet Commonly known as:  FOLVITE 1 mg by PEG Tube route daily.   loratadine 10 MG tablet Commonly known as:  CLARITIN 10 mg by PEG Tube route daily.   magnesium hydroxide 400 MG/5ML suspension Commonly known as:  MILK OF MAGNESIA Take 30 mLs by mouth daily as needed for mild constipation.   metoCLOPramide 10 MG tablet Commonly known as:  REGLAN Place 1 tablet (10 mg total) into feeding tube 4 (four) times daily -  before meals and at bedtime. Via Peg tube   olopatadine 0.1 % ophthalmic solution Commonly known as:  PATANOL Place 1 drop into both eyes every morning.   phenytoin 50 MG tablet Commonly known as:  DILANTIN Place 50 mg into feeding tube 2 (two) times daily.   polyvinyl alcohol 1.4 % ophthalmic solution Commonly known as:  LIQUIFILM TEARS Place 1 drop into both eyes daily at 6 (six) AM.   prednisoLONE 15 MG/5ML Soln Commonly known as:  PRELONE Place into feeding tube. Give 6 mg every morning and 4 mg every evening at 6 pm   promethazine 25 MG tablet Commonly known as:  PHENERGAN Place 25 mg into feeding tube every 6 (six) hours as needed for nausea or vomiting.   RA SALINE ENEMA 19-7 GM/118ML Enem Place 1 each rectally as needed (for constipation).   ranitidine 300 MG tablet Commonly known as:  ZANTAC Place 300 mg into feeding tube daily. PEG tube   sterile water for irrigation Flush 229ml of water into peg tub every 4 hours       No orders of the defined types were placed in this encounter.   Immunization History  Administered Date(s) Administered  . Influenza Whole 12/31/2012  . Influenza-Unspecified 12/29/2011, 12/31/2012, 12/22/2014, 01/22/2016, 01/05/2017  . PPD Test 08/22/2008  . Pneumococcal-Unspecified 06/23/2010, 06/23/2015    Social History   Tobacco Use  . Smoking status: Never Smoker  . Smokeless tobacco: Never Used  Substance Use Topics  . Alcohol use: No    Review of  Systems  DATA OBTAINED: from nurse GENERAL:  no fevers, fatigue, appetite changes SKIN: No itching, rash HEENT: No complaint RESPIRATORY: No cough, wheezing, SOB CARDIAC: No chest pain, palpitations, lower extremity edema  GI: No abdominal pain, No N/V/D or constipation, No heartburn or reflux  GU: No dysuria, frequency or urgency, or incontinence  MUSCULOSKELETAL: No unrelieved bone/joint pain NEUROLOGIC: No headache, dizziness  PSYCHIATRIC: No overt anxiety or sadness  Vitals:   05/22/17 1446  BP: 129/70  Pulse: 80  Resp: 18  Temp: 98 F (36.7 C)  SpO2: 96%   Body mass index is 23.45 kg/m. Physical Exam  GENERAL APPEARANCE: Alert, non-conversant, No acute distress  SKIN: No diaphoresis rash HEENT: Unremarkable RESPIRATORY: Breathing is even, unlabored. Lung sounds are clear   CARDIOVASCULAR: Heart RRR no murmurs, rubs or gallops. No peripheral  edema  GASTROINTESTINAL: Abdomen is soft, non-tender, not distended w/ normal bowel sounds.  GENITOURINARY: Bladder non tender, not distended  MUSCULOSKELETAL; wasting and contractures upper and lower extremities NEUROLOGIC: Cranial nerves 2-12 grossly intact; quadriplegia with some upper extremity movement PSYCHIATRIC: Mood and affect appropriate to situation in the setting of TBI, no behavioral issues  Patient Active Problem List   Diagnosis Date Noted  . Vitamin D deficiency 10/16/2016  . Anemia   . GERD (gastroesophageal reflux disease)   . Clostridium difficile colitis   . Adrenocortical insufficiency ([place])   . Cataract 06/13/2016  . Constipation 06/13/2016  . Contracture of joint of left hand 06/13/2016  . Impaired verbal communication 06/13/2016  . Nontraumatic chronic subdural hemorrhage ([place]) 06/13/2016  . Nonverbal 06/13/2016  . Wheelchair bound 06/13/2016  . Allergic rhinitis 06/13/2016  . S/P percutaneous endoscopic gastrostomy (PEG) tube placement ([place]) 06/13/2016  . Itchy eyes 11/12/2015  . Benign neoplasm  of colon 05/22/2015  . History of colon polyps 05/22/2015  . Lung nodule 05/22/2015  . Abnormal levels of other serum enzymes 05/22/2015  . Gastroesophageal reflux disease 03/17/2015  . Tinea cruris 09/10/2014  . Aspiration pneumonia ([place]) 08/15/2014  . Abscess, prostate 08/15/2014  . Pyelonephritis 08/06/2014  . Nausea & vomiting   . Fever presenting with conditions classified elsewhere 03/16/2014  . Altered mental status 03/16/2014  . Acute bronchiolitis due to unspecified organism 03/11/2014  . Enteritis due to Clostridium difficile 03/11/2014  . Type 2 diabetes mellitus without complication ([place]) 05/39/7673  . Septic shock ([place]) 01/26/2014  . Sepsis ([place]) 01/25/2014  . Acute encephalopathy 01/02/2014  . Acute respiratory failure with hypercapnia ([place]) 01/02/2014  . Chronic anemia 01/02/2014  . Subdural hematoma ([place]) 01/02/2014  . Pain in thoracic spine 12/26/2013  . PEG (percutaneous endoscopic gastrostomy) status ([place]) 10/04/2013  . Rash and nonspecific skin eruption 07/08/2013  . Thrush 07/08/2013  . Heme positive stool 07/07/2013  . Severe sepsis ([place]) 06/23/2013  . E. coli pyelonephritis 06/22/2013  . SIRS (systemic inflammatory response syndrome) ([place]) 06/19/2013  . Leukocytosis 06/19/2013  . Bronchitis 06/19/2013  . Dysphagia 06/19/2013  . Aphasia 06/19/2013  . Other convulsions 08/10/2012  . Iron deficiency anemia 08/10/2012  . Glucocorticoid deficiency ([place]) 08/10/2012  . Febrile illness, acute 06/15/2012  . UTI (lower urinary tract infection) 06/15/2012  . Hypokalemia 11/10/2011  . Diabetic gastroparesis ([place]) 11/08/2011  . Ileus ([place]) 11/08/2011  . Hyponatremia 11/08/2011  . Adrenal insufficiency ([place]) 11/08/2011  . Quadriplegia ([place]) 11/08/2011  . Parkinson disease ([place])   . Seizures ([place])   . Depression     CMP     Component Value Date/Time   NA 134 (A) 09/13/2016   K 3.4 09/13/2016   CL 108 06/26/2016 1424   CO2 26 06/26/2016 1424   GLUCOSE  107 (H) 06/26/2016 1424   BUN 11 09/13/2016   CREATININE 0.5 (A) 09/13/2016   CREATININE 0.66 06/26/2016 1424   CALCIUM 9.1 06/26/2016 1424   PROT 8.3 (H) 06/26/2016 1424   ALBUMIN 3.6 06/26/2016 1424   AST 19 09/13/2016   ALT 11 09/13/2016   ALKPHOS 98 09/13/2016   BILITOT 0.5 06/26/2016 1424   GFRNONAA >60 06/26/2016 1424   GFRAA >60 06/26/2016 1424   Recent Labs    06/26/16 06/26/16 1424 09/13/16  NA 141 141 134*  K  --  3.9 3.4  CL  --  108  --   CO2  --  26  --   GLUCOSE  --  107*  --  BUN 22* 22* 11  CREATININE 0.7 0.66 0.5*  CALCIUM  --  9.1  --    Recent Labs    06/26/16 1424 09/13/16  AST 29 19  ALT 20 11  ALKPHOS 105 98  BILITOT 0.5  --   PROT 8.3*  --   ALBUMIN 3.6  --    Recent Labs    06/26/16 06/26/16 1424 09/13/16  WBC 11.3 11.3* 7.9  NEUTROABS  --  10.2*  --   HGB  --  12.2* 12.2*  HCT  --  36.9* 38*  MCV  --  71.9*  --   PLT  --  183 206   Recent Labs    09/13/16  CHOL 127  LDLCALC 70  TRIG 79   Lab Results  Component Value Date   MICROALBUR 10.4 12/01/2016   Lab Results  Component Value Date   TSH 1.78 09/13/2016   Lab Results  Component Value Date   HGBA1C 5.0 03/24/2017   Lab Results  Component Value Date   CHOL 127 09/13/2016   HDL 55 09/13/2016   LDLCALC 70 09/13/2016   TRIG 79 09/13/2016    Significant Diagnostic Results in last 30 days:  No results found.  Assessment and Plan  Anemia Hemoglobin 12.2, more recent hemoglobin ordered; continue folate 1 mg by mouth daily and iron 330 mg per tube daily  Aphasia Stable chronic; patient has pre-existing cirrhosis he can point to and gestures that he makes to communicate with and patient appears to have understanding when spoken to; continue supportive care  Seizures No reported seizures; continue Dilantin per tube twice a day    Vaudine Dutan D. [name], MD

## 2017-05-24 DIAGNOSIS — R488 Other symbolic dysfunctions: Secondary | ICD-10-CM | POA: Diagnosis not present

## 2017-05-24 DIAGNOSIS — R1312 Dysphagia, oropharyngeal phase: Secondary | ICD-10-CM | POA: Diagnosis not present

## 2017-05-24 DIAGNOSIS — G8250 Quadriplegia, unspecified: Secondary | ICD-10-CM | POA: Diagnosis not present

## 2017-05-25 ENCOUNTER — Encounter: Payer: Self-pay | Admitting: Internal Medicine

## 2017-05-25 DIAGNOSIS — R1312 Dysphagia, oropharyngeal phase: Secondary | ICD-10-CM | POA: Diagnosis not present

## 2017-05-25 DIAGNOSIS — R488 Other symbolic dysfunctions: Secondary | ICD-10-CM | POA: Diagnosis not present

## 2017-05-25 DIAGNOSIS — G8250 Quadriplegia, unspecified: Secondary | ICD-10-CM | POA: Diagnosis not present

## 2017-05-25 NOTE — Assessment & Plan Note (Signed)
No reported seizures; continue Dilantin per tube twice a day

## 2017-05-25 NOTE — Assessment & Plan Note (Signed)
Stable chronic; patient has pre-existing cirrhosis he can point to and gestures that he makes to communicate with and patient appears to have understanding when spoken to; continue supportive care

## 2017-05-25 NOTE — Assessment & Plan Note (Signed)
Hemoglobin 12.2, more recent hemoglobin ordered; continue folate 1 mg by mouth daily and iron 330 mg per tube daily

## 2017-05-26 DIAGNOSIS — E785 Hyperlipidemia, unspecified: Secondary | ICD-10-CM | POA: Diagnosis not present

## 2017-05-26 DIAGNOSIS — E039 Hypothyroidism, unspecified: Secondary | ICD-10-CM | POA: Diagnosis not present

## 2017-05-26 DIAGNOSIS — E559 Vitamin D deficiency, unspecified: Secondary | ICD-10-CM | POA: Diagnosis not present

## 2017-05-26 DIAGNOSIS — D649 Anemia, unspecified: Secondary | ICD-10-CM | POA: Diagnosis not present

## 2017-05-26 DIAGNOSIS — R569 Unspecified convulsions: Secondary | ICD-10-CM | POA: Diagnosis not present

## 2017-05-26 DIAGNOSIS — E1121 Type 2 diabetes mellitus with diabetic nephropathy: Secondary | ICD-10-CM | POA: Diagnosis not present

## 2017-05-26 DIAGNOSIS — I10 Essential (primary) hypertension: Secondary | ICD-10-CM | POA: Diagnosis not present

## 2017-05-26 LAB — HEPATIC FUNCTION PANEL
ALK PHOS: 106 (ref 25–125)
ALT: 15 (ref 10–40)
AST: 22 (ref 14–40)
BILIRUBIN, TOTAL: 0.3

## 2017-05-26 LAB — BASIC METABOLIC PANEL
BUN: 15 (ref 4–21)
CREATININE: 0.6 (ref 0.6–1.3)
GLUCOSE: 89
Potassium: 4 (ref 3.4–5.3)
SODIUM: 140 (ref 137–147)

## 2017-05-26 LAB — VITAMIN D 25 HYDROXY (VIT D DEFICIENCY, FRACTURES): VIT D 25 HYDROXY: 24.15

## 2017-06-05 DIAGNOSIS — D649 Anemia, unspecified: Secondary | ICD-10-CM | POA: Diagnosis not present

## 2017-06-05 DIAGNOSIS — D559 Anemia due to enzyme disorder, unspecified: Secondary | ICD-10-CM | POA: Diagnosis not present

## 2017-06-05 LAB — CBC AND DIFFERENTIAL
HCT: 38 — AB (ref 41–53)
HEMOGLOBIN: 11.6 — AB (ref 13.5–17.5)
Platelets: 200 (ref 150–399)
WBC: 7.4

## 2017-06-05 LAB — BASIC METABOLIC PANEL
BUN: 16 (ref 4–21)
CREATININE: 0.5 — AB (ref 0.6–1.3)
Glucose: 82
POTASSIUM: 3.9 (ref 3.4–5.3)
SODIUM: 138 (ref 137–147)

## 2017-06-14 ENCOUNTER — Encounter: Payer: Self-pay | Admitting: Internal Medicine

## 2017-06-14 ENCOUNTER — Non-Acute Institutional Stay (SKILLED_NURSING_FACILITY): Payer: Medicare Other | Admitting: Internal Medicine

## 2017-06-14 DIAGNOSIS — K3184 Gastroparesis: Secondary | ICD-10-CM | POA: Diagnosis not present

## 2017-06-14 DIAGNOSIS — D508 Other iron deficiency anemias: Secondary | ICD-10-CM | POA: Diagnosis not present

## 2017-06-14 DIAGNOSIS — E1143 Type 2 diabetes mellitus with diabetic autonomic (poly)neuropathy: Secondary | ICD-10-CM | POA: Diagnosis not present

## 2017-06-14 DIAGNOSIS — J3089 Other allergic rhinitis: Secondary | ICD-10-CM | POA: Diagnosis not present

## 2017-06-14 NOTE — Progress Notes (Signed)
Location:  [facility] Room Number: 214D Place of Service:  SNF (31)  [name], MD  Patient Care Team: [name], MD as PCP - General (Internal Medicine)  Extended Emergency Contact Information Primary Emergency Contact: Gambrel,[name] Address: [address]          [place], [place] [address] [name] Phone: [phone] Mobile Phone: [phone] Relation: Mother Secondary Emergency Contact: Constance Haw [place] Phone: [phone] Relation: Brother Father: [place], [place] Mobile Phone: [phone]    Allergies: Aspirin; Keflex [cephalexin]; Nsaids; and Penicillins  Chief Complaint  Patient presents with  . Medical Management of Chronic Issues    HPI: Patient is 62 y.o. male who is being seen for routine issues of AR, DM gastroparesis and Fe def anemia.  Past Medical History:  Diagnosis Date  . Acute encephalopathy 01/02/2014  . Adrenal insufficiency ([place]) 11/08/2011  . Adrenocortical insufficiency ([place])   . Allergic rhinitis   . Anemia    Iron deficient  . Aphasia 06/19/2013  . Cataract   . Clostridium difficile colitis 02/2014  . Constipation   . Contracture of joint of left hand   . Depression   . Diabetic gastroparesis ([place]) 11/08/2011  . Dysphagia 06/19/2013  . Gastroesophageal reflux disease 03/17/2015  . GERD (gastroesophageal reflux disease)   . Glucocorticoid deficiency ([place]) 08/10/2012  . History of colon polyps 05/22/2015  . Ileus ([place]) 11/08/2011  . Impaired verbal communication   . Iron deficiency anemia 08/10/2012  . Lung nodule 05/22/2015  . Nontraumatic chronic subdural hemorrhage ([place])   . Parkinson disease ([place])   . Parkinson's disease ([place])   . PEG (percutaneous endoscopic gastrostomy) status ([place]) 10/04/2013  . Pneumonia 07/2014   aspiration pneumonia  . Pyelonephritis   . Quadriplegia ([place])   . S/P percutaneous  endoscopic gastrostomy (PEG) tube placement ([place])   . Seizures ([place])    last occurance 2015  . SIRS (systemic inflammatory response syndrome) ([place]) 08/06/2014  . Subdural hematoma ([place]) 01/02/2014  . Type 2 diabetes mellitus without complication ([place]) 48/54/6270  . UTI (urinary tract infection) 07/2014  . Vitamin D deficiency 10/16/2016  . Wheelchair bound     Past Surgical History:  Procedure Laterality Date  . COLONOSCOPY    . ESOPHAGOGASTRODUODENOSCOPY    . PEG TUBE PLACEMENT    . PERIPHERALLY INSERTED CENTRAL CATHETER INSERTION    . TONSILLECTOMY  1962    Allergies as of 06/14/2017      Reactions   Aspirin Other (See Comments)   Unknown reaction; "blood doesn't clog too well" per mother.    Keflex [cephalexin]    Rash   Nsaids Other (See Comments)   Unknown reaction   Penicillins Hives, Other (See Comments)   Unknown reaction.  Pt has tolerated cephalosporins in the past.      Medication List        Accurate as of 06/14/17 11:59 PM. Always use your most recent med list.          acetaminophen 325 MG tablet Commonly known as:  TYLENOL Place 650 mg into feeding tube every 6 (six) hours as needed. Fever > 101   baclofen 10 MG tablet Commonly known as:  LIORESAL Place 1 tablet (10 mg total) into feeding tube 3 (three) times daily.   bisacodyl 10 MG suppository Commonly known as:  DULCOLAX Place 10 mg rectally daily as needed for moderate constipation. If  constipation not relieved by milk of magnesia give one 10 mg suppository in 24hours   CENTAMIN Liqd 5 mLs by PEG Tube route daily.   CETAPHIL GENTLE CLEANSER Liqd Apply to face and neck with water and then pat dry daily.   feeding supplement (JEVITY 1.5 CAL) Liqd Place into feeding tube. Initiate Jevity 1.5 cal @@ 58 ml/hr continuous x 20 hr via peg ( hold 1 hr pre/post phenton administration)   ferrous sulfate 220 (44 Fe) MG/5ML solution Place 330 mg into feeding tube See admin instructions. Give 7.42ml  Per  tube every morning   folic acid 1 MG tablet Commonly known as:  FOLVITE 1 mg by PEG Tube route daily.   loratadine 10 MG tablet Commonly known as:  CLARITIN 10 mg by PEG Tube route daily.   magnesium hydroxide 400 MG/5ML suspension Commonly known as:  MILK OF MAGNESIA Take 30 mLs by mouth daily as needed for mild constipation.   metoCLOPramide 10 MG tablet Commonly known as:  REGLAN Place 1 tablet (10 mg total) into feeding tube 4 (four) times daily -  before meals and at bedtime. Via Peg tube   olopatadine 0.1 % ophthalmic solution Commonly known as:  PATANOL Place 1 drop into both eyes every morning.   phenytoin 50 MG tablet Commonly known as:  DILANTIN Place 50 mg into feeding tube 2 (two) times daily.   polyvinyl alcohol 1.4 % ophthalmic solution Commonly known as:  LIQUIFILM TEARS Place 1 drop into both eyes daily at 6 (six) AM.   prednisoLONE 15 MG/5ML Soln Commonly known as:  PRELONE Place into feeding tube. Give 6 mg every morning and 4 mg every evening at 6 pm   promethazine 25 MG tablet Commonly known as:  PHENERGAN Place 25 mg into feeding tube every 6 (six) hours as needed for nausea or vomiting.   RA SALINE ENEMA 19-7 GM/118ML Enem Place 1 each rectally as needed (for constipation).   ranitidine 300 MG tablet Commonly known as:  ZANTAC Place 300 mg into feeding tube daily. PEG tube   sterile water for irrigation Flush 279ml of water into peg tub every 4 hours       No orders of the defined types were placed in this encounter.   Immunization History  Administered Date(s) Administered  . Influenza Whole 12/31/2012  . Influenza-Unspecified 12/29/2011, 12/31/2012, 12/22/2014, 01/22/2016, 01/05/2017  . PPD Test 08/22/2008  . Pneumococcal-Unspecified 06/23/2010, 06/23/2015    Social History   Tobacco Use  . Smoking status: Never Smoker  . Smokeless tobacco: Never Used  Substance Use Topics  . Alcohol use: No    Review of Systems  DATA  OBTAINED: from patient-limited with his pad; nursing-no acute concerns GENERAL:  no fevers, fatigue, appetite changes SKIN: No itching, rash HEENT: No complaint RESPIRATORY: No cough, wheezing, SOB CARDIAC: No chest pain, palpitations, lower extremity edema  GI: No abdominal pain, No N/V/D or constipation, No heartburn or reflux  GU: No dysuria, frequency or urgency, or incontinence  MUSCULOSKELETAL: No unrelieved bone/joint pain NEUROLOGIC: No headache, dizziness  PSYCHIATRIC: No overt anxiety or sadness  Vitals:   06/14/17 1010  BP: 132/77  Pulse: 76  Resp: 20  Temp: 97.9 F (36.6 C)  SpO2: 95%   Body mass index is 23.72 kg/m. Physical Exam  GENERAL APPEARANCE: Alert, non-conversant, No acute distress  SKIN: No diaphoresis rash HEENT: Unremarkable RESPIRATORY: Breathing is even, unlabored. Lung sounds are clear   CARDIOVASCULAR: Heart RRR no murmurs, rubs or gallops.  No peripheral edema  GASTROINTESTINAL: Abdomen is soft, non-tender, not distended w/ normal bowel sounds; PEG to.  GENITOURINARY: Bladder non tender, not distended  MUSCULOSKELETAL: No abnormal joints or musculature NEUROLOGIC: Cranial nerves 2-12 grossly intact; quadriplegia with some use of right upper extremities PSYCHIATRIC: Mood and affect appropriate to situation, no behavioral issues  Patient Active Problem List   Diagnosis Date Noted  . Vitamin D deficiency 10/16/2016  . Anemia   . GERD (gastroesophageal reflux disease)   . Clostridium difficile colitis   . Adrenocortical insufficiency ([place])   . Cataract 06/13/2016  . Constipation 06/13/2016  . Contracture of joint of left hand 06/13/2016  . Impaired verbal communication 06/13/2016  . Nontraumatic chronic subdural hemorrhage ([place]) 06/13/2016  . Nonverbal 06/13/2016  . Wheelchair bound 06/13/2016  . Allergic rhinitis 06/13/2016  . S/P percutaneous endoscopic gastrostomy (PEG) tube placement ([place]) 06/13/2016  . Itchy eyes 11/12/2015  . Benign  neoplasm of colon 05/22/2015  . History of colon polyps 05/22/2015  . Lung nodule 05/22/2015  . Abnormal levels of other serum enzymes 05/22/2015  . Gastroesophageal reflux disease 03/17/2015  . Tinea cruris 09/10/2014  . Aspiration pneumonia ([place]) 08/15/2014  . Abscess, prostate 08/15/2014  . Pyelonephritis 08/06/2014  . Nausea & vomiting   . Fever presenting with conditions classified elsewhere 03/16/2014  . Altered mental status 03/16/2014  . Acute bronchiolitis due to unspecified organism 03/11/2014  . Enteritis due to Clostridium difficile 03/11/2014  . Type 2 diabetes mellitus without complication ([place]) 22/04/5425  . Septic shock ([place]) 01/26/2014  . Sepsis ([place]) 01/25/2014  . Acute encephalopathy 01/02/2014  . Acute respiratory failure with hypercapnia ([place]) 01/02/2014  . Chronic anemia 01/02/2014  . Subdural hematoma ([place]) 01/02/2014  . Pain in thoracic spine 12/26/2013  . PEG (percutaneous endoscopic gastrostomy) status ([place]) 10/04/2013  . Rash and nonspecific skin eruption 07/08/2013  . Thrush 07/08/2013  . Heme positive stool 07/07/2013  . Severe sepsis ([place]) 06/23/2013  . E. coli pyelonephritis 06/22/2013  . SIRS (systemic inflammatory response syndrome) ([place]) 06/19/2013  . Leukocytosis 06/19/2013  . Bronchitis 06/19/2013  . Dysphagia 06/19/2013  . Aphasia 06/19/2013  . Other convulsions 08/10/2012  . Iron deficiency anemia 08/10/2012  . Glucocorticoid deficiency ([place]) 08/10/2012  . Febrile illness, acute 06/15/2012  . UTI (lower urinary tract infection) 06/15/2012  . Hypokalemia 11/10/2011  . Diabetic gastroparesis ([place]) 11/08/2011  . Ileus ([place]) 11/08/2011  . Hyponatremia 11/08/2011  . Adrenal insufficiency ([place]) 11/08/2011  . Quadriplegia ([place]) 11/08/2011  . Parkinson disease ([place])   . Seizures ([place])   . Depression     CMP     Component Value Date/Time   NA 138 06/05/2017   K 3.9 06/05/2017   CL 108 06/26/2016 1424   CO2 26 06/26/2016 1424    GLUCOSE 107 (H) 06/26/2016 1424   BUN 16 06/05/2017   CREATININE 0.5 (A) 06/05/2017   CREATININE 0.66 06/26/2016 1424   CALCIUM 9.1 06/26/2016 1424   PROT 8.3 (H) 06/26/2016 1424   ALBUMIN 3.6 06/26/2016 1424   AST 19 09/13/2016   ALT 11 09/13/2016   ALKPHOS 98 09/13/2016   BILITOT 0.5 06/26/2016 1424   GFRNONAA >60 06/26/2016 1424   GFRAA >60 06/26/2016 1424   Recent Labs    06/26/16 1424 09/13/16 06/05/17  NA 141 134* 138  K 3.9 3.4 3.9  CL 108  --   --   CO2 26  --   --   GLUCOSE 107*  --   --  BUN 22* 11 16  CREATININE 0.66 0.5* 0.5*  CALCIUM 9.1  --   --    Recent Labs    06/26/16 1424 09/13/16  AST 29 19  ALT 20 11  ALKPHOS 105 98  BILITOT 0.5  --   PROT 8.3*  --   ALBUMIN 3.6  --    Recent Labs    06/26/16 1424 09/13/16 06/05/17  WBC 11.3* 7.9 7.4  NEUTROABS 10.2*  --   --   HGB 12.2* 12.2* 11.6*  HCT 36.9* 38* 38*  MCV 71.9*  --   --   PLT 183 206 200   Recent Labs    09/13/16  CHOL 127  LDLCALC 70  TRIG 79   Lab Results  Component Value Date   MICROALBUR 10.4 12/01/2016   Lab Results  Component Value Date   TSH 1.78 09/13/2016   Lab Results  Component Value Date   HGBA1C 5.0 03/24/2017   Lab Results  Component Value Date   CHOL 127 09/13/2016   HDL 55 09/13/2016   LDLCALC 70 09/13/2016   TRIG 79 09/13/2016    Significant Diagnostic Results in last 30 days:  No results found.  Assessment and Plan  Allergic rhinitis Appears controllrd;cont Claritin 10 mg dai;ly  Diabetic gastroparesis No problems with reflux or vomiting continue Reglan 10 mg per 2 at meals and at bedtime  Iron deficiency anemia Most rec Hb 11.6, sl down from prior; cont iron 330 mg PT daily     Meriem Lemieux D. [name], MD

## 2017-06-18 ENCOUNTER — Encounter: Payer: Self-pay | Admitting: Internal Medicine

## 2017-06-18 NOTE — Assessment & Plan Note (Signed)
Most rec Hb 11.6, sl down from prior; cont iron 330 mg PT daily

## 2017-06-18 NOTE — Assessment & Plan Note (Signed)
No problems with reflux or vomiting continue Reglan 10 mg per 2 at meals and at bedtime

## 2017-06-18 NOTE — Assessment & Plan Note (Signed)
Appears controllrd;cont Claritin 10 mg dai;ly

## 2017-07-18 ENCOUNTER — Encounter: Payer: Self-pay | Admitting: Internal Medicine

## 2017-07-18 ENCOUNTER — Non-Acute Institutional Stay (SKILLED_NURSING_FACILITY): Payer: Medicare Other | Admitting: Internal Medicine

## 2017-07-18 DIAGNOSIS — I739 Peripheral vascular disease, unspecified: Secondary | ICD-10-CM | POA: Diagnosis not present

## 2017-07-18 DIAGNOSIS — B351 Tinea unguium: Secondary | ICD-10-CM | POA: Diagnosis not present

## 2017-07-18 DIAGNOSIS — R262 Difficulty in walking, not elsewhere classified: Secondary | ICD-10-CM | POA: Diagnosis not present

## 2017-07-18 DIAGNOSIS — G8250 Quadriplegia, unspecified: Secondary | ICD-10-CM | POA: Diagnosis not present

## 2017-07-18 DIAGNOSIS — E2740 Unspecified adrenocortical insufficiency: Secondary | ICD-10-CM

## 2017-07-18 DIAGNOSIS — E559 Vitamin D deficiency, unspecified: Secondary | ICD-10-CM

## 2017-07-18 NOTE — Progress Notes (Signed)
Location:  [facility] Room Number: 214D Place of Service:  SNF (31)  [name], MD  Patient Care Team: [name], MD as PCP - General (Internal Medicine)  Extended Emergency Contact Information Primary Emergency Contact: Bessler,[name] Address: [address]          [place], [place] [address] [name] Phone: [phone] Mobile Phone: [phone] Relation: Mother Secondary Emergency Contact: Constance Haw [place] Phone: [phone] Relation: Brother Father: Kaymen, Adrian [place] Mobile Phone: [phone]    Allergies: Aspirin; Keflex [cephalexin]; Nsaids; and Penicillins  Chief Complaint  Patient presents with  . Medical Management of Chronic Issues    Routine Visit    HPI: Patient is 62 y.o. male who is being seen for routine issues of vitamin D deficiency, adrenal insufficiency, and quadriplegia.  Past Medical History:  Diagnosis Date  . Acute encephalopathy 01/02/2014  . Adrenal insufficiency ([place]) 11/08/2011  . Adrenocortical insufficiency ([place])   . Allergic rhinitis   . Anemia    Iron deficient  . Aphasia 06/19/2013  . Cataract   . Clostridium difficile colitis 02/2014  . Constipation   . Contracture of joint of left hand   . Depression   . Diabetic gastroparesis ([place]) 11/08/2011  . Dysphagia 06/19/2013  . Gastroesophageal reflux disease 03/17/2015  . GERD (gastroesophageal reflux disease)   . Glucocorticoid deficiency ([place]) 08/10/2012  . History of colon polyps 05/22/2015  . Ileus ([place]) 11/08/2011  . Impaired verbal communication   . Iron deficiency anemia 08/10/2012  . Lung nodule 05/22/2015  . Nontraumatic chronic subdural hemorrhage ([place])   . Parkinson disease ([place])   . Parkinson's disease ([place])   . PEG (percutaneous endoscopic gastrostomy) status ([place]) 10/04/2013  . Pneumonia 07/2014   aspiration pneumonia  . Pyelonephritis   .  Quadriplegia ([place])   . S/P percutaneous endoscopic gastrostomy (PEG) tube placement ([place])   . Seizures ([place])    last occurance 2015  . SIRS (systemic inflammatory response syndrome) ([place]) 08/06/2014  . Subdural hematoma ([place]) 01/02/2014  . Type 2 diabetes mellitus without complication ([place]) 12/18/3005  . UTI (urinary tract infection) 07/2014  . Vitamin D deficiency 10/16/2016  . Wheelchair bound     Past Surgical History:  Procedure Laterality Date  . COLONOSCOPY    . ESOPHAGOGASTRODUODENOSCOPY    . PEG TUBE PLACEMENT    . PERIPHERALLY INSERTED CENTRAL CATHETER INSERTION    . TONSILLECTOMY  1962    Allergies as of 07/18/2017      Reactions   Aspirin Other (See Comments)   Unknown reaction; "blood doesn't clog too well" per mother.    Keflex [cephalexin]    Rash   Nsaids Other (See Comments)   Unknown reaction   Penicillins Hives, Other (See Comments)   Unknown reaction.  Pt has tolerated cephalosporins in the past.      Medication List        Accurate as of 07/18/17 11:59 PM. Always use your most recent med list.          acetaminophen 325 MG tablet Commonly known as:  TYLENOL Place 650 mg into feeding tube every 6 (six) hours as needed. Fever > 101   baclofen 10 MG tablet Commonly known as:  LIORESAL Place 1 tablet (10 mg total) into feeding tube 3 (three) times daily.   bisacodyl 10 MG suppository Commonly known as:  DULCOLAX Place 10 mg rectally daily as  needed for moderate constipation. If constipation not relieved by milk of magnesia give one 10 mg suppository in 24hours   CENTAMIN Liqd 5 mLs by PEG Tube route daily.   CETAPHIL GENTLE CLEANSER Liqd Apply to face and neck with water and then pat dry daily.   feeding supplement (JEVITY 1.5 CAL) Liqd Place into feeding tube. Initiate Jevity 1.5 cal @@ 58 ml/hr continuous x 20 hr via peg ( hold 1 hr pre/post phenton administration)   ferrous sulfate 220 (44 Fe) MG/5ML solution Place 330 mg into feeding tube  See admin instructions. Give 7.47ml  Per tube every morning   folic acid 1 MG tablet Commonly known as:  FOLVITE 1 mg by PEG Tube route daily.   loratadine 10 MG tablet Commonly known as:  CLARITIN 10 mg by PEG Tube route daily.   magnesium hydroxide 400 MG/5ML suspension Commonly known as:  MILK OF MAGNESIA Take 30 mLs by mouth daily as needed for mild constipation.   metoCLOPramide 10 MG tablet Commonly known as:  REGLAN Place 1 tablet (10 mg total) into feeding tube 4 (four) times daily -  before meals and at bedtime. Via Peg tube   olopatadine 0.1 % ophthalmic solution Commonly known as:  PATANOL Place 1 drop into both eyes every morning.   phenytoin 50 MG tablet Commonly known as:  DILANTIN Place 50 mg into feeding tube 2 (two) times daily.   polyvinyl alcohol 1.4 % ophthalmic solution Commonly known as:  LIQUIFILM TEARS Place 1 drop into both eyes daily at 6 (six) AM.   prednisoLONE 15 MG/5ML Soln Commonly known as:  PRELONE Place into feeding tube. Give 6 mg every morning and 4 mg every evening at 6 pm   promethazine 25 MG tablet Commonly known as:  PHENERGAN Place 25 mg into feeding tube every 6 (six) hours as needed for nausea or vomiting.   RA SALINE ENEMA 19-7 GM/118ML Enem Place 1 each rectally as needed (for constipation).   ranitidine 300 MG tablet Commonly known as:  ZANTAC Place 300 mg into feeding tube daily. PEG tube   sterile water for irrigation Flush 29ml of water into peg tub every 4 hours   Vitamin D3 5000 UNIT/ML Liqd 10 mLs by PEG Tube route once a week.       No orders of the defined types were placed in this encounter.   Immunization History  Administered Date(s) Administered  . Influenza Whole 12/31/2012  . Influenza-Unspecified 12/29/2011, 12/31/2012, 12/22/2014, 01/22/2016, 01/05/2017  . PPD Test 08/22/2008  . Pneumococcal-Unspecified 06/23/2010, 06/23/2015    Social History   Tobacco Use  . Smoking status: Never Smoker   . Smokeless tobacco: Never Used  Substance Use Topics  . Alcohol use: No    Review of Systems  DATA OBTAINED: from nurse GENERAL:  no fevers, fatigue, appetite changes SKIN: No itching, rash HEENT: No complaint RESPIRATORY: No cough, wheezing, SOB CARDIAC: No chest pain, palpitations, lower extremity edema  GI: No abdominal pain, No N/V/D or constipation, No heartburn or reflux  GU: No dysuria, frequency or urgency, or incontinence  MUSCULOSKELETAL: No unrelieved bone/joint pain NEUROLOGIC: No headache, dizziness  PSYCHIATRIC: No overt anxiety or sadness  Vitals:   07/18/17 1008  BP: 122/70  Pulse: 74  Resp: 18  Temp: (!) 97 F (36.1 C)  SpO2: 95%   Body mass index is 23.6 kg/m. Physical Exam  GENERAL APPEARANCE: Alert, non-conversant, No acute distress  SKIN: No diaphoresis rash HEENT: Unremarkable RESPIRATORY: Breathing is  even, unlabored. Lung sounds are clear   CARDIOVASCULAR: Heart RRR no murmurs, rubs or gallops. No peripheral edema  GASTROINTESTINAL: Abdomen is soft, non-tender, not distended w/ normal bowel sounds.  GENITOURINARY: Bladder non tender, not distended  MUSCULOSKELETAL: Contractures upper and lower extremity along with wasting NEUROLOGIC: Cranial nerves 2-12 grossly intact some uncoordinated movements of bilateral upper extremities PSYCHIATRIC: Mood and affect appropriate to situation, no behavioral issues  Patient Active Problem List   Diagnosis Date Noted  . Vitamin D deficiency 10/16/2016  . Anemia   . GERD (gastroesophageal reflux disease)   . Clostridium difficile colitis   . Adrenocortical insufficiency ([place])   . Cataract 06/13/2016  . Constipation 06/13/2016  . Contracture of joint of left hand 06/13/2016  . Impaired verbal communication 06/13/2016  . Nontraumatic chronic subdural hemorrhage ([place]) 06/13/2016  . Nonverbal 06/13/2016  . Wheelchair bound 06/13/2016  . Allergic rhinitis 06/13/2016  . S/P percutaneous endoscopic  gastrostomy (PEG) tube placement ([place]) 06/13/2016  . Itchy eyes 11/12/2015  . Benign neoplasm of colon 05/22/2015  . History of colon polyps 05/22/2015  . Lung nodule 05/22/2015  . Abnormal levels of other serum enzymes 05/22/2015  . Gastroesophageal reflux disease 03/17/2015  . Tinea cruris 09/10/2014  . Aspiration pneumonia ([place]) 08/15/2014  . Abscess, prostate 08/15/2014  . Pyelonephritis 08/06/2014  . Nausea & vomiting   . Fever presenting with conditions classified elsewhere 03/16/2014  . Altered mental status 03/16/2014  . Acute bronchiolitis due to unspecified organism 03/11/2014  . Enteritis due to Clostridium difficile 03/11/2014  . Type 2 diabetes mellitus without complication ([employer]) 41/74/0814  . Septic shock ([place]) 01/26/2014  . Sepsis ([place]) 01/25/2014  . Acute encephalopathy 01/02/2014  . Acute respiratory failure with hypercapnia ([place]) 01/02/2014  . Chronic anemia 01/02/2014  . Subdural hematoma ([place]) 01/02/2014  . Pain in thoracic spine 12/26/2013  . PEG (percutaneous endoscopic gastrostomy) status ([place]) 10/04/2013  . Rash and nonspecific skin eruption 07/08/2013  . Thrush 07/08/2013  . Heme positive stool 07/07/2013  . Severe sepsis ([place]) 06/23/2013  . E. coli pyelonephritis 06/22/2013  . SIRS (systemic inflammatory response syndrome) ([place]) 06/19/2013  . Leukocytosis 06/19/2013  . Bronchitis 06/19/2013  . Dysphagia 06/19/2013  . Aphasia 06/19/2013  . Other convulsions 08/10/2012  . Iron deficiency anemia 08/10/2012  . Glucocorticoid deficiency ([place]) 08/10/2012  . Febrile illness, acute 06/15/2012  . UTI (lower urinary tract infection) 06/15/2012  . Hypokalemia 11/10/2011  . Diabetic gastroparesis ([place]) 11/08/2011  . Ileus ([place]) 11/08/2011  . Hyponatremia 11/08/2011  . Adrenal insufficiency ([place]) 11/08/2011  . Quadriplegia ([place]) 11/08/2011  . Parkinson disease ([place])   . Seizures ([place])   . Depression     CMP     Component Value Date/Time   NA 138  06/05/2017   K 3.9 06/05/2017   CL 108 06/26/2016 1424   CO2 26 06/26/2016 1424   GLUCOSE 107 (H) 06/26/2016 1424   BUN 16 06/05/2017   CREATININE 0.5 (A) 06/05/2017   CREATININE 0.66 06/26/2016 1424   CALCIUM 9.1 06/26/2016 1424   PROT 8.3 (H) 06/26/2016 1424   ALBUMIN 3.6 06/26/2016 1424   AST 22 05/26/2017   ALT 15 05/26/2017   ALKPHOS 106 05/26/2017   BILITOT 0.5 06/26/2016 1424   GFRNONAA >60 06/26/2016 1424   GFRAA >60 06/26/2016 1424   Recent Labs    09/13/16 05/26/17 06/05/17  NA 134* 140 138  K 3.4 4.0 3.9  BUN 11 15 16   CREATININE 0.5* 0.6 0.5*   Recent  Labs    09/13/16 05/26/17  AST 19 22  ALT 11 15  ALKPHOS 98 106   Recent Labs    09/13/16 06/05/17  WBC 7.9 7.4  HGB 12.2* 11.6*  HCT 38* 38*  PLT 206 200   Recent Labs    09/13/16  CHOL 127  LDLCALC 70  TRIG 79   Lab Results  Component Value Date   MICROALBUR 10.4 12/01/2016   Lab Results  Component Value Date   TSH 1.78 09/13/2016   Lab Results  Component Value Date   HGBA1C 5.0 03/24/2017   Lab Results  Component Value Date   CHOL 127 09/13/2016   HDL 55 09/13/2016   LDLCALC 70 09/13/2016   TRIG 79 09/13/2016    Significant Diagnostic Results in last 30 days:  No results found.  Assessment and Plan  Vitamin D deficiency Vitamin D 2 months ago was 24.5; plan to continue 5000 units per male 10 mils per tube weekly    Adrenocortical insufficiency (HCC) Stable; continue prednisone 6 in the morning and 4 mg in the afternoon for 2        Quadriplegia Chronic, stable; patient with some upper extremity movement; continue baclofen 10 mg per tube 3 times daily    Jalisia Puchalski D. [name], MD

## 2017-08-07 ENCOUNTER — Encounter: Payer: Self-pay | Admitting: Internal Medicine

## 2017-08-07 NOTE — Assessment & Plan Note (Signed)
Vitamin D 2 months ago was 24.5; plan to continue 5000 units per male 10 mils per tube weekly

## 2017-08-07 NOTE — Assessment & Plan Note (Signed)
Chronic, stable; patient with some upper extremity movement; continue baclofen 10 mg per tube 3 times daily

## 2017-08-07 NOTE — Assessment & Plan Note (Signed)
Stable; continue prednisone 6 in the morning and 4 mg in the afternoon for 2

## 2017-08-08 ENCOUNTER — Encounter: Payer: Self-pay | Admitting: Internal Medicine

## 2017-08-08 NOTE — Progress Notes (Signed)
Opened in error; Disregard.

## 2017-08-09 ENCOUNTER — Encounter: Payer: Self-pay | Admitting: Internal Medicine

## 2017-08-09 ENCOUNTER — Non-Acute Institutional Stay (SKILLED_NURSING_FACILITY): Payer: Medicare Other | Admitting: Internal Medicine

## 2017-08-09 DIAGNOSIS — R1310 Dysphagia, unspecified: Secondary | ICD-10-CM | POA: Diagnosis not present

## 2017-08-09 DIAGNOSIS — K219 Gastro-esophageal reflux disease without esophagitis: Secondary | ICD-10-CM

## 2017-08-09 DIAGNOSIS — E119 Type 2 diabetes mellitus without complications: Secondary | ICD-10-CM | POA: Diagnosis not present

## 2017-08-09 NOTE — Progress Notes (Signed)
Location:  [facility] Room Number: 214D Place of Service:  SNF (31)  [name], MD  Patient Care Team: [name], MD as PCP - General (Internal Medicine)  Extended Emergency Contact Information Primary Emergency Contact: Pascuzzi,[name] Address: [address]          [place], [place] [address] [name] Phone: [phone] Mobile Phone: [phone] Relation: Mother Secondary Emergency Contact: Constance Haw [place] Phone: [phone] Relation: Brother Father: Daegen, [place] Mobile Phone: [phone]    Allergies: Aspirin; Keflex [cephalexin]; Nsaids; and Penicillins  Chief Complaint  Patient presents with  . Medical Management of Chronic Issues    HPI: Patient is 62 y.o. male who is being seen for routine issues of diabetes mellitus type 2, dysphasia, and GERD.  Past Medical History:  Diagnosis Date  . Acute encephalopathy 01/02/2014  . Adrenal insufficiency ([place]) 11/08/2011  . Adrenocortical insufficiency ([place])   . Allergic rhinitis   . Anemia    Iron deficient  . Aphasia 06/19/2013  . Cataract   . Clostridium difficile colitis 02/2014  . Constipation   . Contracture of joint of left hand   . Depression   . Diabetic gastroparesis ([place]) 11/08/2011  . Dysphagia 06/19/2013  . Gastroesophageal reflux disease 03/17/2015  . GERD (gastroesophageal reflux disease)   . Glucocorticoid deficiency ([place]) 08/10/2012  . History of colon polyps 05/22/2015  . Ileus ([place]) 11/08/2011  . Impaired verbal communication   . Iron deficiency anemia 08/10/2012  . Lung nodule 05/22/2015  . Nontraumatic chronic subdural hemorrhage ([place])   . Parkinson disease ([place])   . Parkinson's disease ([place])   . PEG (percutaneous endoscopic gastrostomy) status ([place]) 10/04/2013  . Pneumonia 07/2014   aspiration pneumonia  . Pyelonephritis   . Quadriplegia ([place])   . S/P percutaneous  endoscopic gastrostomy (PEG) tube placement ([place])   . Seizures ([place])    last occurance 2015  . SIRS (systemic inflammatory response syndrome) ([place]) 08/06/2014  . Subdural hematoma ([place]) 01/02/2014  . Type 2 diabetes mellitus without complication ([place]) 82/50/5397  . UTI (urinary tract infection) 07/2014  . Vitamin D deficiency 10/16/2016  . Wheelchair bound     Past Surgical History:  Procedure Laterality Date  . COLONOSCOPY    . ESOPHAGOGASTRODUODENOSCOPY    . PEG TUBE PLACEMENT    . PERIPHERALLY INSERTED CENTRAL CATHETER INSERTION    . TONSILLECTOMY  1962    Allergies as of 08/09/2017      Reactions   Aspirin Other (See Comments)   Unknown reaction; "blood doesn't clog too well" per mother.    Keflex [cephalexin]    Rash   Nsaids Other (See Comments)   Unknown reaction   Penicillins Hives, Other (See Comments)   Unknown reaction.  Pt has tolerated cephalosporins in the past.      Medication List        Accurate as of 08/09/17 11:59 PM. Always use your most recent med list.          acetaminophen 325 MG tablet Commonly known as:  TYLENOL Place 650 mg into feeding tube every 6 (six) hours as needed. Fever > 101   baclofen 10 MG tablet Commonly known as:  LIORESAL Place 1 tablet (10 mg total) into feeding tube 3 (three) times daily.   bisacodyl 10 MG suppository Commonly known as:  DULCOLAX Place 10 mg rectally daily as needed for moderate constipation. If  constipation not relieved by milk of magnesia give one 10 mg suppository in 24hours   CENTAMIN Liqd 5 mLs by PEG Tube route daily.   CETAPHIL GENTLE CLEANSER Liqd Apply to face and neck with water and then pat dry daily.   feeding supplement (JEVITY 1.5 CAL) Liqd Place into feeding tube. Initiate Jevity 1.5 cal @@ 58 ml/hr continuous x 20 hr via peg ( hold 1 hr pre/post phenton administration)   ferrous sulfate 220 (44 Fe) MG/5ML solution Place 330 mg into feeding tube See admin instructions. Give 7.38ml  Per  tube every morning   folic acid 1 MG tablet Commonly known as:  FOLVITE 1 mg by PEG Tube route daily.   loratadine 10 MG tablet Commonly known as:  CLARITIN 10 mg by PEG Tube route daily.   magnesium hydroxide 400 MG/5ML suspension Commonly known as:  MILK OF MAGNESIA Take 30 mLs by mouth daily as needed for mild constipation.   metoCLOPramide 10 MG tablet Commonly known as:  REGLAN Place 1 tablet (10 mg total) into feeding tube 4 (four) times daily -  before meals and at bedtime. Via Peg tube   olopatadine 0.1 % ophthalmic solution Commonly known as:  PATANOL Place 1 drop into both eyes every morning.   phenytoin 50 MG tablet Commonly known as:  DILANTIN Place 50 mg into feeding tube 2 (two) times daily.   polyvinyl alcohol 1.4 % ophthalmic solution Commonly known as:  LIQUIFILM TEARS Place 1 drop into both eyes daily at 6 (six) AM.   prednisoLONE 15 MG/5ML Soln Commonly known as:  PRELONE Place into feeding tube. Give 6 mg every morning and 4 mg every evening at 6 pm   promethazine 25 MG tablet Commonly known as:  PHENERGAN Place 25 mg into feeding tube every 6 (six) hours as needed for nausea or vomiting.   RA SALINE ENEMA 19-7 GM/118ML Enem Place 1 each rectally as needed (for constipation).   ranitidine 300 MG tablet Commonly known as:  ZANTAC Place 300 mg into feeding tube daily. PEG tube   sterile water for irrigation Flush 289ml of water into peg tub every 4 hours   Vitamin D3 5000 UNIT/ML Liqd 10 mLs by PEG Tube route once a week.       No orders of the defined types were placed in this encounter.   Immunization History  Administered Date(s) Administered  . Influenza Whole 12/31/2012  . Influenza-Unspecified 12/29/2011, 12/31/2012, 12/22/2014, 01/22/2016, 01/05/2017  . PPD Test 08/22/2008  . Pneumococcal-Unspecified 06/23/2010, 06/23/2015    Social History   Tobacco Use  . Smoking status: Never Smoker  . Smokeless tobacco: Never Used    Substance Use Topics  . Alcohol use: No    Review of Systems  DATA OBTAINED: from patient-can communicate by pointing to things and words; nursing-no acute concerns GENERAL:  no fevers, fatigue, appetite changes SKIN: No itching, rash HEENT: No complaint RESPIRATORY: No cough, wheezing, SOB CARDIAC: No chest pain, palpitations, lower extremity edema  GI: No abdominal pain, No N/V/D or constipation, No heartburn or reflux  GU: No dysuria, frequency or urgency, or incontinence  MUSCULOSKELETAL: No unrelieved bone/joint pain NEUROLOGIC: No headache, dizziness  PSYCHIATRIC: No overt anxiety or sadness  Vitals:   08/09/17 1456  BP: 122/77  Pulse: 68  Resp: 20  Temp: (!) 97 F (36.1 C)   Body mass index is 23.6 kg/m. Physical Exam  GENERAL APPEARANCE: Alert, conversant, No acute distress  SKIN: No diaphoresis rash HEENT: Unremarkable  RESPIRATORY: Breathing is even, unlabored. Lung sounds are clear   CARDIOVASCULAR: Heart RRR no murmurs, rubs or gallops. No peripheral edema  GASTROINTESTINAL: Abdomen is soft, non-tender, not distended w/ normal bowel sounds; PEG tube GENITOURINARY: Bladder non tender, not distended  MUSCULOSKELETAL: Stinging contractures NEUROLOGIC: Cranial nerves 2-12 grossly intact; quadriplegia with some upper extremity movement PSYCHIATRIC: Mood and affect appropriate to situation, no behavioral issues  Patient Active Problem List   Diagnosis Date Noted  . Vitamin D deficiency 10/16/2016  . Anemia   . GERD (gastroesophageal reflux disease)   . Clostridium difficile colitis   . Adrenocortical insufficiency ([place])   . Cataract 06/13/2016  . Constipation 06/13/2016  . Contracture of joint of left hand 06/13/2016  . Impaired verbal communication 06/13/2016  . Nontraumatic chronic subdural hemorrhage ([place]) 06/13/2016  . Nonverbal 06/13/2016  . Wheelchair bound 06/13/2016  . Allergic rhinitis 06/13/2016  . S/P percutaneous endoscopic gastrostomy (PEG)  tube placement ([place]) 06/13/2016  . Itchy eyes 11/12/2015  . Benign neoplasm of colon 05/22/2015  . History of colon polyps 05/22/2015  . Lung nodule 05/22/2015  . Abnormal levels of other serum enzymes 05/22/2015  . Gastroesophageal reflux disease 03/17/2015  . Tinea cruris 09/10/2014  . Aspiration pneumonia ([place]) 08/15/2014  . Abscess, prostate 08/15/2014  . Pyelonephritis 08/06/2014  . Nausea & vomiting   . Fever presenting with conditions classified elsewhere 03/16/2014  . Altered mental status 03/16/2014  . Acute bronchiolitis due to unspecified organism 03/11/2014  . Enteritis due to Clostridium difficile 03/11/2014  . Type 2 diabetes mellitus without complication ([place]) 01/11/5101  . Septic shock ([place]) 01/26/2014  . Sepsis ([place]) 01/25/2014  . Acute encephalopathy 01/02/2014  . Acute respiratory failure with hypercapnia ([place]) 01/02/2014  . Chronic anemia 01/02/2014  . Subdural hematoma ([place]) 01/02/2014  . Pain in thoracic spine 12/26/2013  . PEG (percutaneous endoscopic gastrostomy) status ([place]) 10/04/2013  . Rash and nonspecific skin eruption 07/08/2013  . Thrush 07/08/2013  . Heme positive stool 07/07/2013  . Severe sepsis ([place]) 06/23/2013  . E. coli pyelonephritis 06/22/2013  . SIRS (systemic inflammatory response syndrome) ([place]) 06/19/2013  . Leukocytosis 06/19/2013  . Bronchitis 06/19/2013  . Dysphagia 06/19/2013  . Aphasia 06/19/2013  . Other convulsions 08/10/2012  . Iron deficiency anemia 08/10/2012  . Glucocorticoid deficiency ([place]) 08/10/2012  . Febrile illness, acute 06/15/2012  . UTI (lower urinary tract infection) 06/15/2012  . Hypokalemia 11/10/2011  . Diabetic gastroparesis ([place]) 11/08/2011  . Ileus ([place]) 11/08/2011  . Hyponatremia 11/08/2011  . Adrenal insufficiency ([place]) 11/08/2011  . Quadriplegia ([place]) 11/08/2011  . Parkinson disease ([place])   . Seizures ([place])   . Depression     CMP     Component Value Date/Time   NA 138 06/05/2017   K 3.9  06/05/2017   CL 108 06/26/2016 1424   CO2 26 06/26/2016 1424   GLUCOSE 107 (H) 06/26/2016 1424   BUN 16 06/05/2017   CREATININE 0.5 (A) 06/05/2017   CREATININE 0.66 06/26/2016 1424   CALCIUM 9.1 06/26/2016 1424   PROT 8.3 (H) 06/26/2016 1424   ALBUMIN 3.6 06/26/2016 1424   AST 22 05/26/2017   ALT 15 05/26/2017   ALKPHOS 106 05/26/2017   BILITOT 0.5 06/26/2016 1424   GFRNONAA >60 06/26/2016 1424   GFRAA >60 06/26/2016 1424   Recent Labs    09/13/16 05/26/17 06/05/17  NA 134* 140 138  K 3.4 4.0 3.9  BUN 11 15 16   CREATININE 0.5* 0.6 0.5*   Recent Labs  09/13/16 05/26/17  AST 19 22  ALT 11 15  ALKPHOS 98 106   Recent Labs    09/13/16 06/05/17  WBC 7.9 7.4  HGB 12.2* 11.6*  HCT 38* 38*  PLT 206 200   Recent Labs    09/13/16  CHOL 127  LDLCALC 70  TRIG 79   Lab Results  Component Value Date   MICROALBUR 10.4 12/01/2016   Lab Results  Component Value Date   TSH 1.78 09/13/2016   Lab Results  Component Value Date   HGBA1C 5.0 03/24/2017   Lab Results  Component Value Date   CHOL 127 09/13/2016   HDL 55 09/13/2016   LDLCALC 70 09/13/2016   TRIG 79 09/13/2016    Significant Diagnostic Results in last 30 days:  No results found.  Assessment and Plan  Type 2 diabetes mellitus without complication Hemoglobin D6Q is 5.0 on diet alone; we will continue to monitor  Dysphagia Chronic and permanent; no problems with tube; will monitor  Gastroesophageal reflux disease There have been no problems; continue Zantac 300 mg per tube nightly and Reglan 10 mg with meals and at bedtime per tube     Webb Silversmith D. [name], MD

## 2017-08-21 ENCOUNTER — Encounter: Payer: Self-pay | Admitting: Internal Medicine

## 2017-08-21 NOTE — Assessment & Plan Note (Signed)
Hemoglobin A1c is 5.0 on diet alone; we will continue to monitor

## 2017-08-21 NOTE — Assessment & Plan Note (Signed)
Chronic and permanent; no problems with tube; will monitor

## 2017-08-21 NOTE — Assessment & Plan Note (Signed)
There have been no problems; continue Zantac 300 mg per tube nightly and Reglan 10 mg with meals and at bedtime per tube

## 2017-08-31 ENCOUNTER — Encounter: Payer: Self-pay | Admitting: Internal Medicine

## 2017-08-31 ENCOUNTER — Non-Acute Institutional Stay (SKILLED_NURSING_FACILITY): Payer: Medicare Other | Admitting: Internal Medicine

## 2017-08-31 DIAGNOSIS — R569 Unspecified convulsions: Secondary | ICD-10-CM | POA: Diagnosis not present

## 2017-08-31 DIAGNOSIS — D508 Other iron deficiency anemias: Secondary | ICD-10-CM | POA: Diagnosis not present

## 2017-08-31 DIAGNOSIS — E1143 Type 2 diabetes mellitus with diabetic autonomic (poly)neuropathy: Secondary | ICD-10-CM

## 2017-08-31 DIAGNOSIS — K3184 Gastroparesis: Secondary | ICD-10-CM

## 2017-08-31 NOTE — Progress Notes (Signed)
Location:  [facility] Room Number: 214D Place of Service:  SNF (31)  [name], MD  Patient Care Team: [name], MD as PCP - General (Internal Medicine)  Extended Emergency Contact Information Primary Emergency Contact: Schow,[name] Address: [address]          [place], [place] [address] [name] Phone: [phone] Mobile Phone: [phone] Relation: Mother Secondary Emergency Contact: Constance Haw [place] Phone: [phone] Relation: Brother Father: Gale, Hulse [place] Mobile Phone: [phone]    Allergies: Aspirin; Keflex [cephalexin]; Nsaids; and Penicillins  Chief Complaint  Patient presents with  . Medical Management of Chronic Issues    Routine Visits     HPI: Patient is 62 y.o. male who is being followed for routine issues of diabetic gastroparesis, seizure disorder, and iron deficiency anemia.  Past Medical History:  Diagnosis Date  . Acute encephalopathy 01/02/2014  . Adrenal insufficiency ([place]) 11/08/2011  . Adrenocortical insufficiency ([place])   . Allergic rhinitis   . Anemia    Iron deficient  . Aphasia 06/19/2013  . Cataract   . Clostridium difficile colitis 02/2014  . Constipation   . Contracture of joint of left hand   . Depression   . Diabetic gastroparesis ([place]) 11/08/2011  . Dysphagia 06/19/2013  . Gastroesophageal reflux disease 03/17/2015  . GERD (gastroesophageal reflux disease)   . Glucocorticoid deficiency ([place]) 08/10/2012  . History of colon polyps 05/22/2015  . Ileus ([place]) 11/08/2011  . Impaired verbal communication   . Iron deficiency anemia 08/10/2012  . Lung nodule 05/22/2015  . Nontraumatic chronic subdural hemorrhage ([place])   . Parkinson disease ([place])   . Parkinson's disease ([place])   . PEG (percutaneous endoscopic gastrostomy) status ([place]) 10/04/2013  . Pneumonia 07/2014   aspiration pneumonia  . Pyelonephritis     . Quadriplegia ([place])   . S/P percutaneous endoscopic gastrostomy (PEG) tube placement ([place])   . Seizures ([place])    last occurance 2015  . SIRS (systemic inflammatory response syndrome) ([place]) 08/06/2014  . Subdural hematoma ([place]) 01/02/2014  . Type 2 diabetes mellitus without complication ([place]) 69/67/8938  . UTI (urinary tract infection) 07/2014  . Vitamin D deficiency 10/16/2016  . Wheelchair bound     Past Surgical History:  Procedure Laterality Date  . COLONOSCOPY    . ESOPHAGOGASTRODUODENOSCOPY    . PEG TUBE PLACEMENT    . PERIPHERALLY INSERTED CENTRAL CATHETER INSERTION    . TONSILLECTOMY  1962    Allergies as of 08/31/2017      Reactions   Aspirin Other (See Comments)   Unknown reaction; "blood doesn't clog too well" per mother.    Keflex [cephalexin]    Rash   Nsaids Other (See Comments)   Unknown reaction   Penicillins Hives, Other (See Comments)   Unknown reaction.  Pt has tolerated cephalosporins in the past.      Medication List        Accurate as of 08/31/17 11:59 PM. Always use your most recent med list.          acetaminophen 325 MG tablet Commonly known as:  TYLENOL Place 650 mg into feeding tube every 6 (six) hours as needed. Fever > 101   baclofen 10 MG tablet Commonly known as:  LIORESAL Place 1 tablet (10 mg total) into feeding tube 3 (three) times daily.   bisacodyl 10 MG suppository Commonly known as:  DULCOLAX Place 10 mg  rectally daily as needed for moderate constipation. If constipation not relieved by milk of magnesia give one 10 mg suppository in 24hours   CENTAMIN Liqd 5 mLs by PEG Tube route daily.   CETAPHIL GENTLE CLEANSER Liqd Apply to face and neck with water and then pat dry daily.   feeding supplement (JEVITY 1.5 CAL) Liqd Place into feeding tube. Initiate Jevity 1.5 cal @@ 58 ml/hr continuous x 20 hr via peg ( hold 1 hr pre/post phenton administration)   ferrous sulfate 220 (44 Fe) MG/5ML solution Place 330 mg into feeding  tube See admin instructions. Give 7.82ml  Per tube every morning   folic acid 1 MG tablet Commonly known as:  FOLVITE 1 mg by PEG Tube route daily.   loratadine 10 MG tablet Commonly known as:  CLARITIN 10 mg by PEG Tube route daily.   magnesium hydroxide 400 MG/5ML suspension Commonly known as:  MILK OF MAGNESIA Take 30 mLs by mouth daily as needed for mild constipation.   metoCLOPramide 10 MG tablet Commonly known as:  REGLAN Place 1 tablet (10 mg total) into feeding tube 4 (four) times daily -  before meals and at bedtime. Via Peg tube   olopatadine 0.1 % ophthalmic solution Commonly known as:  PATANOL Place 1 drop into both eyes every morning.   phenytoin 50 MG tablet Commonly known as:  DILANTIN Place 50 mg into feeding tube 2 (two) times daily.   polyvinyl alcohol 1.4 % ophthalmic solution Commonly known as:  LIQUIFILM TEARS Place 1 drop into both eyes daily at 6 (six) AM.   prednisoLONE 15 MG/5ML Soln Commonly known as:  PRELONE Place into feeding tube. Give 6 mg every morning and 4 mg every evening at 6 pm   promethazine 25 MG tablet Commonly known as:  PHENERGAN Place 25 mg into feeding tube every 6 (six) hours as needed for nausea or vomiting.   RA SALINE ENEMA 19-7 GM/118ML Enem Place 1 each rectally as needed (for constipation).   ranitidine 300 MG tablet Commonly known as:  ZANTAC Place 300 mg into feeding tube daily. PEG tube   sterile water for irrigation Flush 267ml of water into peg tub every 4 hours   Vitamin D3 5000 UNIT/ML Liqd 10 mLs by PEG Tube route once a week.       No orders of the defined types were placed in this encounter.   Immunization History  Administered Date(s) Administered  . Influenza Whole 12/31/2012  . Influenza-Unspecified 12/29/2011, 12/31/2012, 12/22/2014, 01/22/2016, 01/05/2017  . PPD Test 08/22/2008  . Pneumococcal-Unspecified 06/23/2010, 06/23/2015    Social History   Tobacco Use  . Smoking status: Never  Smoker  . Smokeless tobacco: Never Used  Substance Use Topics  . Alcohol use: No    Review of Systems  DATA OBTAINED: from patient, nurse,  GENERAL:  no fevers, fatigue, appetite changes SKIN: No itching, rash HEENT: No complaint RESPIRATORY: No cough, wheezing, SOB CARDIAC: No chest pain, palpitations, lower extremity edema  GI: No abdominal pain, No N/V/D or constipation, No heartburn or reflux  GU: No dysuria, frequency or urgency, or incontinence  MUSCULOSKELETAL: No unrelieved bone/joint pain NEUROLOGIC: No headache, dizziness  PSYCHIATRIC: No overt anxiety or sadness  Vitals:   08/31/17 1055  BP: 120/68  Pulse: 82  Resp: 18  Temp: 98 F (36.7 C)  SpO2: 95%   Body mass index is 23.75 kg/m. Physical Exam  GENERAL APPEARANCE: Alert, nonconversant uses board and gestures to communicate, No acute distress  SKIN: No diaphoresis rash HEENT: Unremarkable RESPIRATORY: Breathing is even, unlabored. Lung sounds are clear   CARDIOVASCULAR: Heart RRR no murmurs, rubs or gallops. No peripheral edema  GASTROINTESTINAL: Abdomen is soft, non-tender, not distended w/ normal bowel sounds; PEG tube.  GENITOURINARY: Bladder non tender, not distended  MUSCULOSKELETAL: No abnormal joints or musculature NEUROLOGIC: Cranial nerves 2-12 grossly intact quadriplegia with some movement of upper extremity PSYCHIATRIC: Mood and affect appropriate to situation, no behavioral issues  Patient Active Problem List   Diagnosis Date Noted  . Urinary tract infection due to Proteus 09/21/2017  . Infection of PEG site ([place]) 09/21/2017  . Hydronephrosis with renal and ureteral calculus obstruction 09/21/2017  . Left ureteral calculus 09/21/2017  . Flexion contractures 09/21/2017  . Vitamin D deficiency 10/16/2016  . Anemia   . GERD (gastroesophageal reflux disease)   . Clostridium difficile colitis   . Adrenocortical insufficiency ([place])   . Cataract 06/13/2016  . Constipation 06/13/2016  .  Contracture of joint of left hand 06/13/2016  . Impaired verbal communication 06/13/2016  . Nontraumatic chronic subdural hemorrhage ([place]) 06/13/2016  . Nonverbal 06/13/2016  . Wheelchair bound 06/13/2016  . Allergic rhinitis 06/13/2016  . S/P percutaneous endoscopic gastrostomy (PEG) tube placement ([place]) 06/13/2016  . Itchy eyes 11/12/2015  . Benign neoplasm of colon 05/22/2015  . History of colon polyps 05/22/2015  . Lung nodule 05/22/2015  . Abnormal levels of other serum enzymes 05/22/2015  . Gastroesophageal reflux disease 03/17/2015  . Tinea cruris 09/10/2014  . Aspiration pneumonia ([place]) 08/15/2014  . Abscess, prostate 08/15/2014  . Pyelonephritis 08/06/2014  . Nausea & vomiting   . Fever presenting with conditions classified elsewhere 03/16/2014  . Altered mental status 03/16/2014  . Acute bronchiolitis due to unspecified organism 03/11/2014  . Enteritis due to Clostridium difficile 03/11/2014  . Type 2 diabetes mellitus without complication ([place]) 62/13/0865  . Septic shock ([place]) 01/26/2014  . Sepsis ([place]) 01/25/2014  . Acute encephalopathy 01/02/2014  . Acute respiratory failure with hypercapnia ([place]) 01/02/2014  . Chronic anemia 01/02/2014  . Subdural hematoma ([place]) 01/02/2014  . Pain in thoracic spine 12/26/2013  . PEG (percutaneous endoscopic gastrostomy) status ([place]) 10/04/2013  . Rash and nonspecific skin eruption 07/08/2013  . Thrush 07/08/2013  . Heme positive stool 07/07/2013  . Severe sepsis ([place]) 06/23/2013  . E. coli pyelonephritis 06/22/2013  . SIRS (systemic inflammatory response syndrome) ([place]) 06/19/2013  . Leukocytosis 06/19/2013  . Bronchitis 06/19/2013  . Dysphagia 06/19/2013  . Aphasia 06/19/2013  . Other convulsions 08/10/2012  . Iron deficiency anemia 08/10/2012  . Glucocorticoid deficiency ([place]) 08/10/2012  . Febrile illness, acute 06/15/2012  . UTI (lower urinary tract infection) 06/15/2012  . Hypokalemia 11/10/2011  . Diabetic  gastroparesis ([place]) 11/08/2011  . Ileus ([place]) 11/08/2011  . Hyponatremia 11/08/2011  . Adrenal insufficiency ([place]) 11/08/2011  . Quadriplegia ([place]) 11/08/2011  . Parkinson disease ([place])   . Seizures ([place])   . Depression     CMP     Component Value Date/Time   NA 135 09/18/2017   K 4.0 09/18/2017   CL 108 06/26/2016 1424   CO2 26 06/26/2016 1424   GLUCOSE 107 (H) 06/26/2016 1424   BUN 12 09/18/2017   CREATININE 0.49 09/18/2017   CALCIUM 8.6 09/18/2017   PROT 8.3 (H) 06/26/2016 1424   ALBUMIN 3.6 06/26/2016 1424   AST 22 05/26/2017   ALT 15 05/26/2017   ALKPHOS 106 05/26/2017   BILITOT 0.5 06/26/2016 1424   GFRNONAA >60 06/26/2016 1424  GFRAA >90 09/18/2017   Recent Labs    05/26/17 06/05/17 09/18/17  NA 140 138 135  K 4.0 3.9 4.0  BUN 15 16 12   CREATININE 0.6 0.5* 0.49  CALCIUM  --   --  8.6   Recent Labs    05/26/17  AST 22  ALT 15  ALKPHOS 106   Recent Labs    06/05/17 09/18/17  WBC 7.4 7.5  HGB 11.6* 11.0*  HCT 38* 34*  PLT 200 298   No results for input(s): CHOL, LDLCALC, TRIG in the last 8760 hours.  Invalid input(s): HCL Lab Results  Component Value Date   MICROALBUR 10.4 12/01/2016   Lab Results  Component Value Date   TSH 1.78 09/13/2016   Lab Results  Component Value Date   HGBA1C 5.0 03/24/2017   Lab Results  Component Value Date   CHOL 127 09/13/2016   HDL 55 09/13/2016   LDLCALC 70 09/13/2016   TRIG 79 09/13/2016    Significant Diagnostic Results in last 30 days:  No results found.  Assessment and Plan  Diabetic gastroparesis Very few problems with reflux or vomiting with feeding; continue Reglan 10 mg per tube before meals and nightly  Seizures No reported seizures continues continue Dilantin 100 mg per tube twice daily  Iron deficiency anemia Chronic, reasonably stable, last hemoglobin is 11 which is little bit down from prior; plan to continue iron 330 mg per tube daily and folate 1 mg per tube daily; will monitor  intervals     Jayma Volpi D. [name], MD

## 2017-09-05 DIAGNOSIS — E860 Dehydration: Secondary | ICD-10-CM | POA: Diagnosis not present

## 2017-09-05 DIAGNOSIS — L89301 Pressure ulcer of unspecified buttock, stage 1: Secondary | ICD-10-CM | POA: Diagnosis not present

## 2017-09-05 DIAGNOSIS — R402441 Other coma, without documented Glasgow coma scale score, or with partial score reported, in the field [EMT or ambulance]: Secondary | ICD-10-CM | POA: Diagnosis not present

## 2017-09-05 DIAGNOSIS — N390 Urinary tract infection, site not specified: Secondary | ICD-10-CM | POA: Diagnosis not present

## 2017-09-05 DIAGNOSIS — J690 Pneumonitis due to inhalation of food and vomit: Secondary | ICD-10-CM | POA: Diagnosis not present

## 2017-09-05 DIAGNOSIS — R001 Bradycardia, unspecified: Secondary | ICD-10-CM | POA: Diagnosis not present

## 2017-09-05 DIAGNOSIS — N136 Pyonephrosis: Secondary | ICD-10-CM | POA: Diagnosis not present

## 2017-09-05 DIAGNOSIS — R000 Tachycardia, unspecified: Secondary | ICD-10-CM | POA: Diagnosis not present

## 2017-09-05 DIAGNOSIS — A419 Sepsis, unspecified organism: Secondary | ICD-10-CM | POA: Diagnosis not present

## 2017-09-05 DIAGNOSIS — I959 Hypotension, unspecified: Secondary | ICD-10-CM | POA: Diagnosis not present

## 2017-09-05 DIAGNOSIS — E2740 Unspecified adrenocortical insufficiency: Secondary | ICD-10-CM | POA: Diagnosis not present

## 2017-09-05 DIAGNOSIS — E876 Hypokalemia: Secondary | ICD-10-CM | POA: Diagnosis not present

## 2017-09-05 DIAGNOSIS — R509 Fever, unspecified: Secondary | ICD-10-CM | POA: Diagnosis not present

## 2017-09-05 DIAGNOSIS — G8250 Quadriplegia, unspecified: Secondary | ICD-10-CM | POA: Diagnosis not present

## 2017-09-05 DIAGNOSIS — K9422 Gastrostomy infection: Secondary | ICD-10-CM | POA: Diagnosis not present

## 2017-09-05 DIAGNOSIS — N132 Hydronephrosis with renal and ureteral calculous obstruction: Secondary | ICD-10-CM | POA: Diagnosis not present

## 2017-09-05 DIAGNOSIS — R0902 Hypoxemia: Secondary | ICD-10-CM | POA: Diagnosis not present

## 2017-09-06 DIAGNOSIS — B964 Proteus (mirabilis) (morganii) as the cause of diseases classified elsewhere: Secondary | ICD-10-CM | POA: Diagnosis present

## 2017-09-06 DIAGNOSIS — M2550 Pain in unspecified joint: Secondary | ICD-10-CM | POA: Diagnosis not present

## 2017-09-06 DIAGNOSIS — M24542 Contracture, left hand: Secondary | ICD-10-CM | POA: Diagnosis not present

## 2017-09-06 DIAGNOSIS — K5900 Constipation, unspecified: Secondary | ICD-10-CM | POA: Diagnosis not present

## 2017-09-06 DIAGNOSIS — M24541 Contracture, right hand: Secondary | ICD-10-CM | POA: Diagnosis not present

## 2017-09-06 DIAGNOSIS — E2740 Unspecified adrenocortical insufficiency: Secondary | ICD-10-CM | POA: Diagnosis present

## 2017-09-06 DIAGNOSIS — Z7401 Bed confinement status: Secondary | ICD-10-CM | POA: Diagnosis not present

## 2017-09-06 DIAGNOSIS — N200 Calculus of kidney: Secondary | ICD-10-CM | POA: Diagnosis not present

## 2017-09-06 DIAGNOSIS — G20 Parkinson's disease: Secondary | ICD-10-CM | POA: Diagnosis present

## 2017-09-06 DIAGNOSIS — N134 Hydroureter: Secondary | ICD-10-CM | POA: Diagnosis not present

## 2017-09-06 DIAGNOSIS — K219 Gastro-esophageal reflux disease without esophagitis: Secondary | ICD-10-CM | POA: Diagnosis present

## 2017-09-06 DIAGNOSIS — R4701 Aphasia: Secondary | ICD-10-CM | POA: Diagnosis present

## 2017-09-06 DIAGNOSIS — K5641 Fecal impaction: Secondary | ICD-10-CM | POA: Diagnosis present

## 2017-09-06 DIAGNOSIS — G8250 Quadriplegia, unspecified: Secondary | ICD-10-CM | POA: Diagnosis present

## 2017-09-06 DIAGNOSIS — N390 Urinary tract infection, site not specified: Secondary | ICD-10-CM | POA: Diagnosis not present

## 2017-09-06 DIAGNOSIS — F329 Major depressive disorder, single episode, unspecified: Secondary | ICD-10-CM | POA: Diagnosis present

## 2017-09-06 DIAGNOSIS — R531 Weakness: Secondary | ICD-10-CM | POA: Diagnosis not present

## 2017-09-06 DIAGNOSIS — N12 Tubulo-interstitial nephritis, not specified as acute or chronic: Secondary | ICD-10-CM | POA: Diagnosis not present

## 2017-09-06 DIAGNOSIS — G40909 Epilepsy, unspecified, not intractable, without status epilepticus: Secondary | ICD-10-CM | POA: Diagnosis present

## 2017-09-06 DIAGNOSIS — Z452 Encounter for adjustment and management of vascular access device: Secondary | ICD-10-CM | POA: Diagnosis not present

## 2017-09-06 DIAGNOSIS — Z8669 Personal history of other diseases of the nervous system and sense organs: Secondary | ICD-10-CM | POA: Diagnosis not present

## 2017-09-06 DIAGNOSIS — L03311 Cellulitis of abdominal wall: Secondary | ICD-10-CM | POA: Diagnosis present

## 2017-09-06 DIAGNOSIS — R4702 Dysphasia: Secondary | ICD-10-CM | POA: Diagnosis present

## 2017-09-06 DIAGNOSIS — M24532 Contracture, left wrist: Secondary | ICD-10-CM | POA: Diagnosis present

## 2017-09-06 DIAGNOSIS — N201 Calculus of ureter: Secondary | ICD-10-CM | POA: Diagnosis not present

## 2017-09-06 DIAGNOSIS — R000 Tachycardia, unspecified: Secondary | ICD-10-CM | POA: Diagnosis not present

## 2017-09-06 DIAGNOSIS — Z431 Encounter for attention to gastrostomy: Secondary | ICD-10-CM | POA: Diagnosis not present

## 2017-09-06 DIAGNOSIS — Z79899 Other long term (current) drug therapy: Secondary | ICD-10-CM | POA: Diagnosis not present

## 2017-09-06 DIAGNOSIS — Z993 Dependence on wheelchair: Secondary | ICD-10-CM | POA: Diagnosis not present

## 2017-09-06 DIAGNOSIS — E119 Type 2 diabetes mellitus without complications: Secondary | ICD-10-CM | POA: Diagnosis present

## 2017-09-06 DIAGNOSIS — J690 Pneumonitis due to inhalation of food and vomit: Secondary | ICD-10-CM | POA: Diagnosis present

## 2017-09-06 DIAGNOSIS — N319 Neuromuscular dysfunction of bladder, unspecified: Secondary | ICD-10-CM | POA: Diagnosis present

## 2017-09-06 DIAGNOSIS — K9422 Gastrostomy infection: Secondary | ICD-10-CM | POA: Diagnosis present

## 2017-09-06 DIAGNOSIS — A419 Sepsis, unspecified organism: Secondary | ICD-10-CM | POA: Diagnosis present

## 2017-09-06 DIAGNOSIS — Z86010 Personal history of colonic polyps: Secondary | ICD-10-CM | POA: Diagnosis not present

## 2017-09-06 DIAGNOSIS — D72829 Elevated white blood cell count, unspecified: Secondary | ICD-10-CM | POA: Diagnosis not present

## 2017-09-06 DIAGNOSIS — R488 Other symbolic dysfunctions: Secondary | ICD-10-CM | POA: Diagnosis not present

## 2017-09-06 DIAGNOSIS — J984 Other disorders of lung: Secondary | ICD-10-CM | POA: Diagnosis not present

## 2017-09-06 DIAGNOSIS — N136 Pyonephrosis: Secondary | ICD-10-CM | POA: Diagnosis present

## 2017-09-06 DIAGNOSIS — R112 Nausea with vomiting, unspecified: Secondary | ICD-10-CM | POA: Diagnosis not present

## 2017-09-06 DIAGNOSIS — J189 Pneumonia, unspecified organism: Secondary | ICD-10-CM | POA: Diagnosis not present

## 2017-09-06 DIAGNOSIS — N132 Hydronephrosis with renal and ureteral calculous obstruction: Secondary | ICD-10-CM | POA: Diagnosis not present

## 2017-09-06 DIAGNOSIS — Z880 Allergy status to penicillin: Secondary | ICD-10-CM | POA: Diagnosis not present

## 2017-09-06 DIAGNOSIS — Z881 Allergy status to other antibiotic agents status: Secondary | ICD-10-CM | POA: Diagnosis not present

## 2017-09-06 DIAGNOSIS — E876 Hypokalemia: Secondary | ICD-10-CM | POA: Diagnosis present

## 2017-09-06 DIAGNOSIS — M6249 Contracture of muscle, multiple sites: Secondary | ICD-10-CM | POA: Diagnosis not present

## 2017-09-06 DIAGNOSIS — Z8739 Personal history of other diseases of the musculoskeletal system and connective tissue: Secondary | ICD-10-CM | POA: Diagnosis not present

## 2017-09-06 DIAGNOSIS — N3090 Cystitis, unspecified without hematuria: Secondary | ICD-10-CM | POA: Diagnosis not present

## 2017-09-06 DIAGNOSIS — Z833 Family history of diabetes mellitus: Secondary | ICD-10-CM | POA: Diagnosis not present

## 2017-09-06 DIAGNOSIS — Z960 Presence of urogenital implants: Secondary | ICD-10-CM | POA: Diagnosis not present

## 2017-09-12 DIAGNOSIS — N3090 Cystitis, unspecified without hematuria: Secondary | ICD-10-CM | POA: Diagnosis not present

## 2017-09-12 DIAGNOSIS — M2450 Contracture, unspecified joint: Secondary | ICD-10-CM | POA: Diagnosis not present

## 2017-09-12 DIAGNOSIS — R569 Unspecified convulsions: Secondary | ICD-10-CM | POA: Diagnosis not present

## 2017-09-12 DIAGNOSIS — N390 Urinary tract infection, site not specified: Secondary | ICD-10-CM | POA: Diagnosis not present

## 2017-09-12 DIAGNOSIS — K2211 Ulcer of esophagus with bleeding: Secondary | ICD-10-CM | POA: Diagnosis present

## 2017-09-12 DIAGNOSIS — Z79899 Other long term (current) drug therapy: Secondary | ICD-10-CM | POA: Diagnosis not present

## 2017-09-12 DIAGNOSIS — E1143 Type 2 diabetes mellitus with diabetic autonomic (poly)neuropathy: Secondary | ICD-10-CM | POA: Diagnosis present

## 2017-09-12 DIAGNOSIS — J9601 Acute respiratory failure with hypoxia: Secondary | ICD-10-CM | POA: Diagnosis not present

## 2017-09-12 DIAGNOSIS — J690 Pneumonitis due to inhalation of food and vomit: Secondary | ICD-10-CM | POA: Diagnosis not present

## 2017-09-12 DIAGNOSIS — E2740 Unspecified adrenocortical insufficiency: Secondary | ICD-10-CM | POA: Diagnosis not present

## 2017-09-12 DIAGNOSIS — R4701 Aphasia: Secondary | ICD-10-CM | POA: Diagnosis present

## 2017-09-12 DIAGNOSIS — Z880 Allergy status to penicillin: Secondary | ICD-10-CM | POA: Diagnosis not present

## 2017-09-12 DIAGNOSIS — E119 Type 2 diabetes mellitus without complications: Secondary | ICD-10-CM | POA: Diagnosis not present

## 2017-09-12 DIAGNOSIS — K5900 Constipation, unspecified: Secondary | ICD-10-CM | POA: Diagnosis not present

## 2017-09-12 DIAGNOSIS — Z931 Gastrostomy status: Secondary | ICD-10-CM | POA: Diagnosis not present

## 2017-09-12 DIAGNOSIS — E876 Hypokalemia: Secondary | ICD-10-CM | POA: Diagnosis present

## 2017-09-12 DIAGNOSIS — M24531 Contracture, right wrist: Secondary | ICD-10-CM | POA: Diagnosis present

## 2017-09-12 DIAGNOSIS — K219 Gastro-esophageal reflux disease without esophagitis: Secondary | ICD-10-CM | POA: Diagnosis present

## 2017-09-12 DIAGNOSIS — M24532 Contracture, left wrist: Secondary | ICD-10-CM | POA: Diagnosis present

## 2017-09-12 DIAGNOSIS — Z833 Family history of diabetes mellitus: Secondary | ICD-10-CM | POA: Diagnosis not present

## 2017-09-12 DIAGNOSIS — R531 Weakness: Secondary | ICD-10-CM | POA: Diagnosis not present

## 2017-09-12 DIAGNOSIS — K449 Diaphragmatic hernia without obstruction or gangrene: Secondary | ICD-10-CM | POA: Diagnosis present

## 2017-09-12 DIAGNOSIS — Z431 Encounter for attention to gastrostomy: Secondary | ICD-10-CM | POA: Diagnosis not present

## 2017-09-12 DIAGNOSIS — K209 Esophagitis, unspecified: Secondary | ICD-10-CM | POA: Diagnosis not present

## 2017-09-12 DIAGNOSIS — E8809 Other disorders of plasma-protein metabolism, not elsewhere classified: Secondary | ICD-10-CM | POA: Diagnosis present

## 2017-09-12 DIAGNOSIS — N136 Pyonephrosis: Secondary | ICD-10-CM | POA: Diagnosis not present

## 2017-09-12 DIAGNOSIS — M24542 Contracture, left hand: Secondary | ICD-10-CM | POA: Diagnosis not present

## 2017-09-12 DIAGNOSIS — K3184 Gastroparesis: Secondary | ICD-10-CM | POA: Diagnosis present

## 2017-09-12 DIAGNOSIS — E46 Unspecified protein-calorie malnutrition: Secondary | ICD-10-CM | POA: Diagnosis present

## 2017-09-12 DIAGNOSIS — M2550 Pain in unspecified joint: Secondary | ICD-10-CM | POA: Diagnosis not present

## 2017-09-12 DIAGNOSIS — R488 Other symbolic dysfunctions: Secondary | ICD-10-CM | POA: Diagnosis not present

## 2017-09-12 DIAGNOSIS — K9422 Gastrostomy infection: Secondary | ICD-10-CM | POA: Diagnosis not present

## 2017-09-12 DIAGNOSIS — G20 Parkinson's disease: Secondary | ICD-10-CM | POA: Diagnosis present

## 2017-09-12 DIAGNOSIS — R112 Nausea with vomiting, unspecified: Secondary | ICD-10-CM | POA: Diagnosis not present

## 2017-09-12 DIAGNOSIS — M6249 Contracture of muscle, multiple sites: Secondary | ICD-10-CM | POA: Diagnosis not present

## 2017-09-12 DIAGNOSIS — D649 Anemia, unspecified: Secondary | ICD-10-CM | POA: Diagnosis not present

## 2017-09-12 DIAGNOSIS — B964 Proteus (mirabilis) (morganii) as the cause of diseases classified elsewhere: Secondary | ICD-10-CM | POA: Diagnosis not present

## 2017-09-12 DIAGNOSIS — G40909 Epilepsy, unspecified, not intractable, without status epilepticus: Secondary | ICD-10-CM | POA: Diagnosis present

## 2017-09-12 DIAGNOSIS — E871 Hypo-osmolality and hyponatremia: Secondary | ICD-10-CM | POA: Diagnosis not present

## 2017-09-12 DIAGNOSIS — G8250 Quadriplegia, unspecified: Secondary | ICD-10-CM | POA: Diagnosis not present

## 2017-09-12 DIAGNOSIS — N281 Cyst of kidney, acquired: Secondary | ICD-10-CM | POA: Diagnosis not present

## 2017-09-12 DIAGNOSIS — M24541 Contracture, right hand: Secondary | ICD-10-CM | POA: Diagnosis not present

## 2017-09-12 DIAGNOSIS — N201 Calculus of ureter: Secondary | ICD-10-CM | POA: Diagnosis not present

## 2017-09-12 DIAGNOSIS — N132 Hydronephrosis with renal and ureteral calculous obstruction: Secondary | ICD-10-CM | POA: Diagnosis not present

## 2017-09-12 DIAGNOSIS — K920 Hematemesis: Secondary | ICD-10-CM | POA: Diagnosis not present

## 2017-09-12 DIAGNOSIS — Z960 Presence of urogenital implants: Secondary | ICD-10-CM | POA: Diagnosis not present

## 2017-09-12 DIAGNOSIS — Z7401 Bed confinement status: Secondary | ICD-10-CM | POA: Diagnosis not present

## 2017-09-12 DIAGNOSIS — Z0000 Encounter for general adult medical examination without abnormal findings: Secondary | ICD-10-CM | POA: Diagnosis not present

## 2017-09-12 DIAGNOSIS — N200 Calculus of kidney: Secondary | ICD-10-CM | POA: Diagnosis not present

## 2017-09-12 DIAGNOSIS — A419 Sepsis, unspecified organism: Secondary | ICD-10-CM | POA: Diagnosis not present

## 2017-09-12 DIAGNOSIS — R195 Other fecal abnormalities: Secondary | ICD-10-CM | POA: Diagnosis not present

## 2017-09-12 DIAGNOSIS — D62 Acute posthemorrhagic anemia: Secondary | ICD-10-CM | POA: Diagnosis present

## 2017-09-12 DIAGNOSIS — Z86010 Personal history of colonic polyps: Secondary | ICD-10-CM | POA: Diagnosis not present

## 2017-09-12 DIAGNOSIS — K9423 Gastrostomy malfunction: Secondary | ICD-10-CM | POA: Diagnosis not present

## 2017-09-12 DIAGNOSIS — J189 Pneumonia, unspecified organism: Secondary | ICD-10-CM | POA: Diagnosis not present

## 2017-09-13 ENCOUNTER — Encounter: Payer: Self-pay | Admitting: Internal Medicine

## 2017-09-13 ENCOUNTER — Non-Acute Institutional Stay (SKILLED_NURSING_FACILITY): Payer: Medicare Other | Admitting: Internal Medicine

## 2017-09-13 DIAGNOSIS — M2450 Contracture, unspecified joint: Secondary | ICD-10-CM

## 2017-09-13 DIAGNOSIS — N132 Hydronephrosis with renal and ureteral calculous obstruction: Secondary | ICD-10-CM

## 2017-09-13 DIAGNOSIS — N201 Calculus of ureter: Secondary | ICD-10-CM | POA: Diagnosis not present

## 2017-09-13 DIAGNOSIS — G8250 Quadriplegia, unspecified: Secondary | ICD-10-CM

## 2017-09-13 DIAGNOSIS — B964 Proteus (mirabilis) (morganii) as the cause of diseases classified elsewhere: Secondary | ICD-10-CM

## 2017-09-13 DIAGNOSIS — E2740 Unspecified adrenocortical insufficiency: Secondary | ICD-10-CM | POA: Diagnosis not present

## 2017-09-13 DIAGNOSIS — K9422 Gastrostomy infection: Secondary | ICD-10-CM | POA: Diagnosis not present

## 2017-09-13 DIAGNOSIS — R569 Unspecified convulsions: Secondary | ICD-10-CM

## 2017-09-13 DIAGNOSIS — A419 Sepsis, unspecified organism: Secondary | ICD-10-CM | POA: Diagnosis not present

## 2017-09-13 DIAGNOSIS — J690 Pneumonitis due to inhalation of food and vomit: Secondary | ICD-10-CM | POA: Diagnosis not present

## 2017-09-13 DIAGNOSIS — N390 Urinary tract infection, site not specified: Secondary | ICD-10-CM

## 2017-09-13 NOTE — Progress Notes (Signed)
: Provider:   Location:  [facility] Room Number: 952 Place of Service:  SNF [phone]) Provider: Noah Chase. [name] Coil, MD  PCP: [name] Duos, MD Patient Care Team: [name] Duos, MD as PCP - General (Internal Medicine)  Extended Emergency Contact Information Primary Emergency Contact: [name] Address: [address]          [place], [place] [address] Johnnette Litter of [place] Phone: [phone] Mobile Phone: [phone] Relation: Mother Secondary Emergency Contact: [name] States of [place] Phone: [phone] Relation: Brother Father: [name], [name] States of [place] Mobile Phone: [phone]     Allergies: Aspirin; Keflex [cephalexin]; Nsaids; and Penicillins  Chief Complaint  Patient presents with  . Readmit To SNF    discharged from [facility]    HPI: Patient is 62 y.o. male with quadriplegia, GERD, dysphasia, history of adrenal insufficiency, seizure disorder, resented to [facility] ED with 1 day duration of nausea vomiting nonbloody nonbilious emesis, with no apparent pain no diarrhea no constipation, reported fever by patient's mother.  No recent coughs or colds or other symptomology.  Patient was admitted to [facility] from 6/11-18 where he was treated for sepsis secondary to cystitis and aspiration pneumonia as well as Proteus pyelonephritis complicated by uro-lithiasis and hydronephrosis secondary to ureteral calculus.  Patient was treated with IV antibiotic therapy, as well as stented on the left.  ID recommended ertapenem 1 g daily until 09/23/2017 and removal of stent in 1 week's time.  Patient is discharged back to skilled nursing facility with a PICC line for IV antibiotic therapy.  While at skilled nursing facility patient will be followed for seizures treated with Dilantin, contractures treated with baclofen and adrenal insufficiency treated with prednisone.  Past  Medical History:  Diagnosis Date  . Acute encephalopathy 01/02/2014  . Adrenal insufficiency ([place]) 11/08/2011  . Adrenocortical insufficiency ([place])   . Allergic rhinitis   . Anemia    Iron deficient  . Aphasia 06/19/2013  . Cataract   . Clostridium difficile colitis 02/2014  . Constipation   . Contracture of joint of left hand   . Depression   . Diabetic gastroparesis ([place]) 11/08/2011  . Dysphagia 06/19/2013  . Gastroesophageal reflux disease 03/17/2015  . GERD (gastroesophageal reflux disease)   . Glucocorticoid deficiency ([place]) 08/10/2012  . History of colon polyps 05/22/2015  . Ileus ([place]) 11/08/2011  . Impaired verbal communication   . Iron deficiency anemia 08/10/2012  . Lung nodule 05/22/2015  . Nontraumatic chronic subdural hemorrhage ([place])   . Parkinson disease ([place])   . Parkinson's disease ([place])   . PEG (percutaneous endoscopic gastrostomy) status ([place]) 10/04/2013  . Pneumonia 07/2014   aspiration pneumonia  . Pyelonephritis   . Quadriplegia ([place])   . S/P percutaneous endoscopic gastrostomy (PEG) tube placement ([place])   . Seizures ([place])    last occurance 2015  . SIRS (systemic inflammatory response syndrome) ([place]) 08/06/2014  . Subdural hematoma ([place]) 01/02/2014  . Type 2 diabetes mellitus without complication ([place]) 09/25/1599  . UTI (urinary tract infection) 07/2014  . Vitamin D deficiency 10/16/2016  . Wheelchair bound     Past Surgical History:  Procedure Laterality Date  . COLONOSCOPY    . ESOPHAGOGASTRODUODENOSCOPY    . PEG TUBE PLACEMENT    . PERIPHERALLY INSERTED CENTRAL CATHETER INSERTION    . TONSILLECTOMY  1962    Allergies as of 09/13/2017      Reactions  Aspirin Other (See Comments)   Unknown reaction; "blood doesn't clog too well" per mother.    Keflex [cephalexin]    Rash   Nsaids Other (See Comments)   Unknown reaction   Penicillins Hives, Other (See Comments)   Unknown reaction.  Pt has tolerated cephalosporins in the past.      Medication  List        Accurate as of 09/13/17  9:42 AM. Always use your most recent med list.          acetaminophen 325 MG tablet Commonly known as:  TYLENOL Place 650 mg into feeding tube every 6 (six) hours as needed. Fever > 101   baclofen 10 MG tablet Commonly known as:  LIORESAL Place 1 tablet (10 mg total) into feeding tube 3 (three) times daily.   bisacodyl 10 MG suppository Commonly known as:  DULCOLAX Place 10 mg rectally daily as needed for moderate constipation. If constipation not relieved by milk of magnesia give one 10 mg suppository in 24hours   bisacodyl 5 MG EC tablet Commonly known as:  DULCOLAX Take 10 mg by mouth daily as needed for moderate constipation.   CENTAMIN Liqd 5 mLs by PEG Tube route daily.   CETAPHIL GENTLE CLEANSER Liqd Apply to face and neck with water and then pat dry daily.   DILANTIN 125 MG/5ML suspension Generic drug:  phenytoin Place 100 mg into feeding tube 2 (two) times daily.   feeding supplement (JEVITY 1.5 CAL) Liqd Place into feeding tube. Initiate Jevity 1.5 cal @@ 58 ml/hr continuous x 20 hr via peg ( hold 1 hr pre/post phenton administration)   ferrous sulfate 220 (44 Fe) MG/5ML solution Place 330 mg into feeding tube See admin instructions. Give 7.52ml  Per tube every morning   folic acid 1 MG tablet Commonly known as:  FOLVITE 1 mg by PEG Tube route daily.   loratadine 10 MG tablet Commonly known as:  CLARITIN 10 mg by PEG Tube route daily.   magnesium hydroxide 400 MG/5ML suspension Commonly known as:  MILK OF MAGNESIA Take 30 mLs by mouth daily as needed for mild constipation.   metoCLOPramide 10 MG tablet Commonly known as:  REGLAN Place 1 tablet (10 mg total) into feeding tube 4 (four) times daily -  before meals and at bedtime. Via Peg tube   olopatadine 0.1 % ophthalmic solution Commonly known as:  PATANOL Place 1 drop into both eyes every morning.   polyethylene glycol packet Commonly known as:  MIRALAX /  GLYCOLAX Place 17 g into feeding tube daily as needed.   polyvinyl alcohol 1.4 % ophthalmic solution Commonly known as:  LIQUIFILM TEARS Place 1 drop into both eyes daily at 6 (six) AM.   prednisoLONE 15 MG/5ML Soln Commonly known as:  PRELONE Place into feeding tube. Give 6 mg every morning and 4 mg every evening at 6 pm   promethazine 25 MG tablet Commonly known as:  PHENERGAN Place 25 mg into feeding tube every 6 (six) hours as needed for nausea or vomiting.   RA SALINE ENEMA 19-7 GM/118ML Enem Place 1 each rectally as needed (for constipation).   ranitidine 300 MG tablet Commonly known as:  ZANTAC Place 300 mg into feeding tube daily. PEG tube   sterile water for irrigation Flush 228ml of water into peg tub every 4 hours   Vitamin D3 5000 UNIT/ML Liqd 10 mLs by PEG Tube route once a week.       No orders of the  defined types were placed in this encounter.   Immunization History  Administered Date(s) Administered  . Influenza Whole 12/31/2012  . Influenza-Unspecified 12/29/2011, 12/31/2012, 12/22/2014, 01/22/2016, 01/05/2017  . PPD Test 08/22/2008  . Pneumococcal-Unspecified 06/23/2010, 06/23/2015    Social History   Tobacco Use  . Smoking status: Never Smoker  . Smokeless tobacco: Never Used  Substance Use Topics  . Alcohol use: No    Family history is   Family History  Problem Relation Age of Onset  . Hypothyroidism Mother   . Hypertension Mother   . Diabetes Father   . Hypertension Father   . Diabetes Paternal Grandfather       Review of Systems  DATA OBTAINED: from patient-very limited, nonverbal but can point and make gestures; nursing-no acute concerns GENERAL:  no fevers, fatigue, appetite changes SKIN: No itching, or rash EYES: No eye pain, redness, discharge EARS: No earache, tinnitus, change in hearing NOSE: No congestion, drainage or bleeding  MOUTH/THROAT: No mouth or tooth pain, No sore throat RESPIRATORY: No cough, wheezing,  SOB CARDIAC: No chest pain, palpitations, lower extremity edema  GI: No abdominal pain, No N/V/D or constipation, No heartburn or reflux  GU: No dysuria, frequency or urgency, or incontinence  MUSCULOSKELETAL: No unrelieved bone/joint pain NEUROLOGIC: No headache, dizziness or focal weakness PSYCHIATRIC: No c/o anxiety or sadness   Vitals:   09/13/17 0924  BP: 114/65  Pulse: 74  Resp: 20  Temp: 97.8 F (36.6 C)  SpO2: 94%    SpO2 Readings from Last 1 Encounters:  09/13/17 94%   Body mass index is 23.63 kg/m.     Physical Exam  GENERAL APPEARANCE: Alert, non-conversant,  No acute distress.  SKIN: No diaphoresis rash HEAD: Normocephalic, atraumatic  EYES: Conjunctiva/lids clear. Pupils round, reactive. EOMs intact.  EARS: External exam WNL, canals clear. Hearing grossly normal.  NOSE: No deformity or discharge.  MOUTH/THROAT: Lips w/o lesions  RESPIRATORY: Breathing is even, unlabored. Lung sounds are clear   CARDIOVASCULAR: Heart RRR no murmurs, rubs or gallops. No peripheral edema.   GASTROINTESTINAL: Abdomen is soft, non-tender, not distended w/ normal bowel sounds; PEG tube GENITOURINARY: Bladder non tender, not distended  MUSCULOSKELETAL: No abnormal joints or musculature NEUROLOGIC:  Cranial nerves 2-12 grossly intact; quadriplegia with some upper extremity movement PSYCHIATRIC: Mood and affect appropriate to situation, no behavioral issues  Patient Active Problem List   Diagnosis Date Noted  . Vitamin D deficiency 10/16/2016  . Anemia   . GERD (gastroesophageal reflux disease)   . Clostridium difficile colitis   . Adrenocortical insufficiency ([place])   . Cataract 06/13/2016  . Constipation 06/13/2016  . Contracture of joint of left hand 06/13/2016  . Impaired verbal communication 06/13/2016  . Nontraumatic chronic subdural hemorrhage ([place]) 06/13/2016  . Nonverbal 06/13/2016  . Wheelchair bound 06/13/2016  . Allergic rhinitis 06/13/2016  . S/P percutaneous  endoscopic gastrostomy (PEG) tube placement ([place]) 06/13/2016  . Itchy eyes 11/12/2015  . Benign neoplasm of colon 05/22/2015  . History of colon polyps 05/22/2015  . Lung nodule 05/22/2015  . Abnormal levels of other serum enzymes 05/22/2015  . Gastroesophageal reflux disease 03/17/2015  . Tinea cruris 09/10/2014  . Aspiration pneumonia ([place]) 08/15/2014  . Abscess, prostate 08/15/2014  . Pyelonephritis 08/06/2014  . Nausea & vomiting   . Fever presenting with conditions classified elsewhere 03/16/2014  . Altered mental status 03/16/2014  . Acute bronchiolitis due to unspecified organism 03/11/2014  . Enteritis due to Clostridium difficile 03/11/2014  . Type 2 diabetes  mellitus without complication ([place]) 97/41/6384  . Septic shock ([place]) 01/26/2014  . Sepsis ([place]) 01/25/2014  . Acute encephalopathy 01/02/2014  . Acute respiratory failure with hypercapnia ([place]) 01/02/2014  . Chronic anemia 01/02/2014  . Subdural hematoma ([place]) 01/02/2014  . Pain in thoracic spine 12/26/2013  . PEG (percutaneous endoscopic gastrostomy) status ([place]) 10/04/2013  . Rash and nonspecific skin eruption 07/08/2013  . Thrush 07/08/2013  . Heme positive stool 07/07/2013  . Severe sepsis ([place]) 06/23/2013  . E. coli pyelonephritis 06/22/2013  . SIRS (systemic inflammatory response syndrome) ([place]) 06/19/2013  . Leukocytosis 06/19/2013  . Bronchitis 06/19/2013  . Dysphagia 06/19/2013  . Aphasia 06/19/2013  . Other convulsions 08/10/2012  . Iron deficiency anemia 08/10/2012  . Glucocorticoid deficiency ([place]) 08/10/2012  . Febrile illness, acute 06/15/2012  . UTI (lower urinary tract infection) 06/15/2012  . Hypokalemia 11/10/2011  . Diabetic gastroparesis ([place]) 11/08/2011  . Ileus ([place]) 11/08/2011  . Hyponatremia 11/08/2011  . Adrenal insufficiency ([place]) 11/08/2011  . Quadriplegia ([place]) 11/08/2011  . Parkinson disease ([place])   . Seizures ([place])   . Depression       Labs reviewed: Basic Metabolic  Panel:    Component Value Date/Time   NA 138 06/05/2017   K 3.9 06/05/2017   CL 108 06/26/2016 1424   CO2 26 06/26/2016 1424   GLUCOSE 107 (H) 06/26/2016 1424   BUN 16 06/05/2017   CREATININE 0.5 (A) 06/05/2017   CREATININE 0.66 06/26/2016 1424   CALCIUM 9.1 06/26/2016 1424   PROT 8.3 (H) 06/26/2016 1424   ALBUMIN 3.6 06/26/2016 1424   AST 22 05/26/2017   ALT 15 05/26/2017   ALKPHOS 106 05/26/2017   BILITOT 0.5 06/26/2016 1424   GFRNONAA >60 06/26/2016 1424   GFRAA >60 06/26/2016 1424    Recent Labs    05/26/17 06/05/17  NA 140 138  K 4.0 3.9  BUN 15 16  CREATININE 0.6 0.5*   Liver Function Tests: Recent Labs    05/26/17  AST 22  ALT 15  ALKPHOS 106   No results for input(s): LIPASE, AMYLASE in the last 8760 hours. No results for input(s): AMMONIA in the last 8760 hours. CBC: Recent Labs    06/05/17  WBC 7.4  HGB 11.6*  HCT 38*  PLT 200   Lipid No results for input(s): CHOL, HDL, LDLCALC, TRIG in the last 8760 hours.  Cardiac Enzymes: No results for input(s): CKTOTAL, CKMB, CKMBINDEX, TROPONINI in the last 8760 hours. BNP: No results for input(s): BNP in the last 8760 hours. Lab Results  Component Value Date   MICROALBUR 10.4 12/01/2016   Lab Results  Component Value Date   HGBA1C 5.0 03/24/2017   Lab Results  Component Value Date   TSH 1.78 09/13/2016   Lab Results  Component Value Date   TXMIWOEH21 224 01/29/2014   No results found for: FOLATE Lab Results  Component Value Date   IRON 48 01/29/2014   TIBC 125 (L) 01/29/2014    Imaging and Procedures obtained prior to SNF admission: Dg Abd Acute W/chest  Result Date: 06/26/2016 CLINICAL DATA:  Nausea and vomiting. Parkinson disease. Gastrostomy tube feeding. Quadriplegia. EXAM: DG ABDOMEN ACUTE W/ 1V CHEST COMPARISON:  01/11/2016 chest radiograph. 11/26/2014 CT abdomen/pelvis. FINDINGS: Stable cardiomediastinal silhouette with normal heart size and aortic atherosclerosis. No pneumothorax.  No pleural effusion. Slightly low lung volumes. No pulmonary edema. No acute consolidative airspace disease. Percutaneous gastrostomy tube is seen terminating in the left upper quadrant of the abdomen. No disproportionately dilated  small bowel loops or significant air-fluid levels . Moderate colorectal stool volume. No evidence of pneumatosis or pneumoperitoneum. No radiopaque urolithiasis. IMPRESSION: 1. No active disease in the chest . 2. Percutaneous gastrostomy tube terminates in the left upper quadrant of the abdomen. No evidence of free intraperitoneal air. 3. Nonobstructive bowel gas pattern . 4. Moderate colorectal stool volume. Electronically Signed   By: Ilona Sorrel M.D.   On: 06/26/2016 14:57     Not all labs, radiology exams or other studies done during hospitalization come through on my EPIC note; however they are reviewed by me.    Assessment and Plan  Sepsis/Proteus UTI/aspiration pneumonia/PEG site infection- treated with IV antibiotics with continuation of the ertapenem on discharge 1 g daily until 09/23/2017 SNF -admitted for OT/PT and residential care.  We will continue ertapenem 1 g daily for 10 more days  Left ureteral calculus with hydronephrosis-7 mm calculus, stent placement; patient found to have constipation during surgery and was manually disimpacted which improved his nausea and vomiting; urology recommend outpatient follow-up for retrieval of stents in 1 week time SNF -follow-up with Dr. Terie Purser, urology in 1 week; continue ertapenem 1 g daily through PICC line until 09/23/2017  Seizure disorder SNF -100 mg per G-tube 3 times daily to continue  Quadriplegia with contractures SNF -continue baclofen 10 mg per tube 3 times daily  Adrenal insufficiency SNF -stable; continue Prelone 1 meg per kick per tube daily   Time spent greater than 35 minutes;> 50% of time with patient was spent reviewing records, labs, tests and studies, counseling and developing plan of  care  Webb Silversmith D. [name] Coil, MD

## 2017-09-18 ENCOUNTER — Encounter: Payer: Self-pay | Admitting: *Deleted

## 2017-09-18 DIAGNOSIS — N200 Calculus of kidney: Secondary | ICD-10-CM | POA: Diagnosis not present

## 2017-09-18 DIAGNOSIS — Z960 Presence of urogenital implants: Secondary | ICD-10-CM | POA: Diagnosis not present

## 2017-09-18 DIAGNOSIS — N201 Calculus of ureter: Secondary | ICD-10-CM | POA: Diagnosis not present

## 2017-09-18 LAB — VITAMIN D 25 HYDROXY (VIT D DEFICIENCY, FRACTURES): Vit D, 25-Hydroxy: 21.04

## 2017-09-18 LAB — CBC AND DIFFERENTIAL
HEMATOCRIT: 34 — AB (ref 41–53)
HEMOGLOBIN: 11 — AB (ref 13.5–17.5)
Platelets: 298 (ref 150–399)
WBC: 7.5

## 2017-09-18 LAB — BASIC METABOLIC PANEL
BUN: 12 (ref 4–21)
CREATININE: 0.49
Calcium: 8.6
Glucose: 118
POTASSIUM: 4
SODIUM: 135

## 2017-09-21 ENCOUNTER — Encounter: Payer: Self-pay | Admitting: Internal Medicine

## 2017-09-21 DIAGNOSIS — K9422 Gastrostomy infection: Secondary | ICD-10-CM | POA: Insufficient documentation

## 2017-09-21 DIAGNOSIS — N390 Urinary tract infection, site not specified: Secondary | ICD-10-CM

## 2017-09-21 DIAGNOSIS — M2450 Contracture, unspecified joint: Secondary | ICD-10-CM | POA: Insufficient documentation

## 2017-09-21 DIAGNOSIS — B964 Proteus (mirabilis) (morganii) as the cause of diseases classified elsewhere: Secondary | ICD-10-CM | POA: Insufficient documentation

## 2017-09-21 DIAGNOSIS — N132 Hydronephrosis with renal and ureteral calculous obstruction: Secondary | ICD-10-CM | POA: Insufficient documentation

## 2017-09-21 DIAGNOSIS — N201 Calculus of ureter: Secondary | ICD-10-CM | POA: Insufficient documentation

## 2017-09-22 ENCOUNTER — Non-Acute Institutional Stay (SKILLED_NURSING_FACILITY): Payer: Medicare Other

## 2017-09-22 DIAGNOSIS — Z0000 Encounter for general adult medical examination without abnormal findings: Secondary | ICD-10-CM

## 2017-09-22 NOTE — Progress Notes (Signed)
Subjective:   [name] is a 62 y.o. male who presents for Medicare Annual/Subsequent preventive examination at [facility]  Last AWV-09/21/2017    Objective:    Vitals: BP 132/70 (BP Location: Left Arm, Patient Position: Supine)   Pulse 75   Temp 98 F (36.7 C) (Oral)   Ht 5\' 9"  (1.753 m)   Wt 160 lb (72.6 kg)   SpO2 92%   BMI 23.63 kg/m   Body mass index is 23.63 kg/m.  Advanced Directives 09/22/2017 09/13/2017 08/31/2017 08/09/2017 08/08/2017 07/18/2017 06/14/2017  Does Patient Have a Medical Advance Directive? No No No No No No No  Type of Advance Directive - - - - - - -  Does patient want to make changes to medical advance directive? - - - - - - -  Would patient like information on creating a medical advance directive? - - - - - - -  Pre-existing out of facility DNR order (yellow form or pink MOST form) - - - - - - -    Tobacco Social History   Tobacco Use  Smoking Status Never Smoker  Smokeless Tobacco Never Used     Counseling given: Not Answered   Clinical Intake:  Pre-visit preparation completed: No  Pain : Faces Faces Pain Scale: No hurt  Faces Pain Scale: No hurt  Nutritional Risks: None Diabetes: Yes CBG done?: No Did pt. bring in CBG monitor from home?: No  How often do you need to have someone help you when you read instructions, pamphlets, or other written materials from your doctor or pharmacy?: 3 - Sometimes  Interpreter Needed?: No  Information entered by :: Tyron Russell, RN  Past Medical History:  Diagnosis Date  . Acute encephalopathy 01/02/2014  . Adrenal insufficiency (HCC) 11/08/2011  . Adrenocortical insufficiency (HCC)   . Allergic rhinitis   . Anemia    Iron deficient  . Aphasia 06/19/2013  . Cataract   . Clostridium difficile colitis 02/2014  . Constipation   . Contracture of joint of left hand   . Depression   . Diabetic gastroparesis (HCC) 11/08/2011  . Dysphagia 06/19/2013  . Gastroesophageal reflux  disease 03/17/2015  . GERD (gastroesophageal reflux disease)   . Glucocorticoid deficiency (HCC) 08/10/2012  . History of colon polyps 05/22/2015  . Ileus (HCC) 11/08/2011  . Impaired verbal communication   . Iron deficiency anemia 08/10/2012  . Lung nodule 05/22/2015  . Nontraumatic chronic subdural hemorrhage (HCC)   . Parkinson disease (HCC)   . Parkinson's disease (HCC)   . PEG (percutaneous endoscopic gastrostomy) status (HCC) 10/04/2013  . Pneumonia 07/2014   aspiration pneumonia  . Pyelonephritis   . Quadriplegia (HCC)   . S/P percutaneous endoscopic gastrostomy (PEG) tube placement (HCC)   . Seizures (HCC)    last occurance 2015  . SIRS (systemic inflammatory response syndrome) (HCC) 08/06/2014  . Subdural hematoma (HCC) 01/02/2014  . Type 2 diabetes mellitus without complication (HCC) 03/11/2014  . UTI (urinary tract infection) 07/2014  . Vitamin D deficiency 10/16/2016  . Wheelchair bound    Past Surgical History:  Procedure Laterality Date  . COLONOSCOPY    . ESOPHAGOGASTRODUODENOSCOPY    . PEG TUBE PLACEMENT    . PERIPHERALLY INSERTED CENTRAL CATHETER INSERTION    . TONSILLECTOMY  1962   Family History  Problem Relation Age of Onset  . Hypothyroidism Mother   . Hypertension Mother   . Diabetes Father   . Hypertension Father   . Diabetes  Paternal Grandfather    Social History   Socioeconomic History  . Marital status: Single    Spouse name: Not on file  . Number of children: Not on file  . Years of education: Not on file  . Highest education level: Not on file  Occupational History  . Occupation: truck Public librarian Needs  . Financial resource strain: Not on file  . Food insecurity:    Worry: Not on file    Inability: Not on file  . Transportation needs:    Medical: Not on file    Non-medical: Not on file  Tobacco Use  . Smoking status: Never Smoker  . Smokeless tobacco: Never Used  Substance and Sexual Activity  . Alcohol use: No  . Drug use: No    . Sexual activity: Not Currently  Lifestyle  . Physical activity:    Days per week: Not on file    Minutes per session: Not on file  . Stress: Not on file  Relationships  . Social connections:    Talks on phone: Not on file    Gets together: Not on file    Attends religious service: Not on file    Active member of club or organization: Not on file    Attends meetings of clubs or organizations: Not on file    Relationship status: Not on file  Other Topics Concern  . Not on file  Social History Narrative   Admitted to [facility] 06/08/2005   Never married   Never smoked   Alcohol none   Full code    Outpatient Encounter Medications as of 09/22/2017  Medication Sig  . acetaminophen (TYLENOL) 325 MG tablet Place 650 mg into feeding tube every 6 (six) hours as needed. Fever > 101  . baclofen (LIORESAL) 10 MG tablet Place 1 tablet (10 mg total) into feeding tube 3 (three) times daily.  . bisacodyl (DULCOLAX) 10 MG suppository Place 10 mg rectally daily as needed for moderate constipation. If constipation not relieved by milk of magnesia give one 10 mg suppository in 24hours  . bisacodyl (DULCOLAX) 5 MG EC tablet Take 10 mg by mouth daily as needed for moderate constipation.  . ertapenem (INVANZ) 1 g SOLR injection Inject 1 g into the muscle daily.  . ferrous sulfate 220 (44 FE) MG/5ML solution Place 330 mg into feeding tube See admin instructions. Give 7.39ml  Per tube every morning  . folic acid (FOLVITE) 1 MG tablet 1 mg by PEG Tube route daily.  Marland Kitchen loratadine (CLARITIN) 10 MG tablet 10 mg by PEG Tube route daily.  . magnesium hydroxide (MILK OF MAGNESIA) 400 MG/5ML suspension Take 30 mLs by mouth daily as needed for mild constipation.  . metoCLOPramide (REGLAN) 10 MG tablet Place 1 tablet (10 mg total) into feeding tube 4 (four) times daily -  before meals and at bedtime. Via Peg tube  . Multiple Vitamins-Minerals (CENTAMIN) LIQD 5 mLs by PEG Tube route daily.   . Nutritional  Supplements (FEEDING SUPPLEMENT, JEVITY 1.5 CAL,) LIQD Place into feeding tube. Initiate Jevity 1.5 cal @@ 58 ml/hr continuous x 20 hr via peg ( hold 1 hr pre/post phenton administration)  . olopatadine (PATANOL) 0.1 % ophthalmic solution Place 1 drop into both eyes every morning.  . phenytoin (DILANTIN) 125 MG/5ML suspension Place 100 mg into feeding tube 2 (two) times daily.  . polyethylene glycol (MIRALAX / GLYCOLAX) packet Place 17 g into feeding tube daily as needed.  . polyvinyl alcohol (LIQUIFILM  TEARS) 1.4 % ophthalmic solution Place 1 drop into both eyes daily at 6 (six) AM.  . prednisoLONE (PRELONE) 15 MG/5ML SOLN Place into feeding tube. Give 6 mg every morning and 4 mg every evening at 6 pm  . promethazine (PHENERGAN) 25 MG tablet Place 25 mg into feeding tube every 6 (six) hours as needed for nausea or vomiting.  . ranitidine (ZANTAC) 300 MG tablet Place 300 mg into feeding tube daily. PEG tube   . Soap & Cleansers (CETAPHIL GENTLE CLEANSER) LIQD Apply to face and neck with water and then pat dry daily.  . Sodium Phosphates (RA SALINE ENEMA) 19-7 GM/118ML ENEM Place 1 each rectally as needed (for constipation).  . Water For Irrigation, Sterile (STERILE WATER FOR IRRIGATION) Flush of water into peg tub every 4 hours   No facility-administered encounter medications on file as of 09/22/2017.     Activities of Daily Living In your present state of health, do you have any difficulty performing the following activities: 09/22/2017  Hearing? N  Vision? N  Difficulty concentrating or making decisions? N  Walking or climbing stairs? Y  Dressing or bathing? Y  Doing errands, shopping? Y  Preparing Food and eating ? Y  Using the Toilet? Y  In the past six months, have you accidently leaked urine? Y  Do you have problems with loss of bowel control? Y  Managing your Medications? Y  Managing your Finances? Y  Housekeeping or managing your Housekeeping? Y  Some recent data might be  hidden    Patient Care Team: Margit Hanks, MD as PCP - General (Internal Medicine)   Assessment:   This is a routine wellness examination for Faelan.  Exercise Activities and Dietary recommendations Current Exercise Habits: The patient does not participate in regular exercise at present, Exercise limited by: orthopedic condition(s)  Goals    None      Fall Risk Fall Risk  09/22/2017 09/21/2016  Falls in the past year? No No   Is the patient's home free of loose throw rugs in walkways, pet beds, electrical cords, etc?   yes      Grab bars in the bathroom? yes      Handrails on the stairs?   yes      Adequate lighting?   yes   Depression Screen PHQ 2/9 Scores 09/22/2017 09/21/2016  PHQ - 2 Score - 0  Exception Documentation Medical reason -    Cognitive Function MMSE - Mini Mental State Exam 09/22/2017 09/21/2016  Not completed: Unable to complete Unable to complete        Immunization History  Administered Date(s) Administered  . Influenza Whole 12/31/2012  . Influenza-Unspecified 12/29/2011, 12/31/2012, 12/22/2014, 01/22/2016, 01/05/2017  . PPD Test 08/22/2008  . Pneumococcal-Unspecified 06/23/2010, 06/23/2015    Qualifies for Shingles Vaccine? Not in past records  Screening Tests Health Maintenance  Topic Date Due  . OPHTHALMOLOGY EXAM  05/03/2017  . FOOT EXAM  06/15/2017  . HEMOGLOBIN A1C  09/22/2017  . TETANUS/TDAP  03/28/2018 (Originally 09/18/1975)  . Hepatitis C Screening  03/28/2018 (Originally [birth year])  . HIV Screening  03/28/2018 (Originally 09/18/1971)  . COLONOSCOPY  03/29/2023 (Originally 09/18/2006)  . INFLUENZA VACCINE  10/26/2017  . URINE MICROALBUMIN  12/01/2017  . PNEUMOCOCCAL POLYSACCHARIDE VACCINE  Completed   Cancer Screenings: Lung: Low Dose CT Chest recommended if Age 38-80 years, 30 pack-year currently smoking OR have quit w/in 15years. Patient does not qualify. Colorectal: excluded  Additional Screenings:  Hepatitis C  Screening: unable to decline or accept  TDAP due: ordered Diabetic eye exam due: ordered    Plan:    I have personally reviewed and addressed the Medicare Annual Wellness questionnaire and have noted the following in the patient's chart:  A. Medical and social history B. Use of alcohol, tobacco or illicit drugs  C. Current medications and supplements D. Functional ability and status E.  Nutritional status F.  Physical activity G. Advance directives H. List of other physicians I.  Hospitalizations, surgeries, and ER visits in previous 12 months J.  Vitals K. Screenings to include hearing, vision, cognitive, depression L. Referrals and appointments - none  In addition, I am unable to review and discuss with incapacitated patient certain preventive protocols, quality metrics, and best practice recommendations. A written personalized care plan for preventive services as well as general preventive health recommendations were provided to patient.   See attached scanned questionnaire for additional information.   Signed,   Tyron Russell, RN Nurse Health Advisor

## 2017-09-22 NOTE — Patient Instructions (Signed)
[name] , Thank you for taking time to come for your Medicare Wellness Visit. I appreciate your ongoing commitment to your health goals. Please review the following plan we discussed and let me know if I can assist you in the future.   Screening recommendations/referrals: Colonoscopy excluded Recommended yearly ophthalmology/optometry visit for glaucoma screening and checkup Recommended yearly dental visit for hygiene and checkup  Vaccinations: Influenza vaccine up to date, due 2019 fall season Pneumococcal vaccine up to date, completed Tdap vaccine due, ordered Shingles vaccine not in past records  Advanced directives: Need a copy for chart  Conditions/risks identified: none  Next appointment: Dr. [name] makes rounds  Preventive Care 40-64 Years, Male Preventive care refers to lifestyle choices and visits with your health care provider that can promote health and wellness. What does preventive care include?  A yearly physical exam. This is also called an annual well check.  Dental exams once or twice a year.  Routine eye exams. Ask your health care provider how often you should have your eyes checked.  Personal lifestyle choices, including:  Daily care of your teeth and gums.  Regular physical activity.  Eating a healthy diet.  Avoiding tobacco and drug use.  Limiting alcohol use.  Practicing safe sex.  Taking low-dose aspirin every day starting at age 98. What happens during an annual well check? The services and screenings done by your health care provider during your annual well check will depend on your age, overall health, lifestyle risk factors, and family history of disease. Counseling  Your health care provider may ask you questions about your:  Alcohol use.  Tobacco use.  Drug use.  Emotional well-being.  Home and relationship well-being.  Sexual activity.  Eating habits.  Work and work Statistician. Screening  You may have the  following tests or measurements:  Height, weight, and BMI.  Blood pressure.  Lipid and cholesterol levels. These may be checked every 5 years, or more frequently if you are over 32 years old.  Skin check.  Lung cancer screening. You may have this screening every year starting at age 42 if you have a 30-pack-year history of smoking and currently smoke or have quit within the past 15 years.  Fecal occult blood test (FOBT) of the stool. You may have this test every year starting at age 67.  Flexible sigmoidoscopy or colonoscopy. You may have a sigmoidoscopy every 5 years or a colonoscopy every 10 years starting at age 39.  Prostate cancer screening. Recommendations will vary depending on your family history and other risks.  Hepatitis C blood test.  Hepatitis B blood test.  Sexually transmitted disease (STD) testing.  Diabetes screening. This is done by checking your blood sugar (glucose) after you have not eaten for a while (fasting). You may have this done every 1-3 years. Discuss your test results, treatment options, and if necessary, the need for more tests with your health care provider. Vaccines  Your health care provider may recommend certain vaccines, such as:  Influenza vaccine. This is recommended every year.  Tetanus, diphtheria, and acellular pertussis (Tdap, Td) vaccine. You may need a Td booster every 10 years.  Zoster vaccine. You may need this after age 40.  Pneumococcal 13-valent conjugate (PCV13) vaccine. You may need this if you have certain conditions and have not been vaccinated.  Pneumococcal polysaccharide (PPSV23) vaccine. You may need one or two doses if you smoke cigarettes or if you have certain conditions. Talk to your health care provider about  which screenings and vaccines you need and how often you need them. This information is not intended to replace advice given to you by your health care provider. Make sure you discuss any questions you have with  your health care provider. Document Released: 04/10/2015 Document Revised: 12/02/2015 Document Reviewed: 01/13/2015 Elsevier Interactive Patient Education  2017 [place] Prevention in the Home Falls can cause injuries. They can happen to people of all ages. There are many things you can do to make your home safe and to help prevent falls. What can I do on the outside of my home?  Regularly fix the edges of walkways and driveways and fix any cracks.  Remove anything that might make you trip as you walk through a door, such as a raised step or threshold.  Trim any bushes or trees on the path to your home.  Use bright outdoor lighting.  Clear any walking paths of anything that might make someone trip, such as rocks or tools.  Regularly check to see if handrails are loose or broken. Make sure that both sides of any steps have handrails.  Any raised decks and porches should have guardrails on the edges.  Have any leaves, snow, or ice cleared regularly.  Use sand or salt on walking paths during winter.  Clean up any spills in your garage right away. This includes oil or grease spills. What can I do in the bathroom?  Use night lights.  Install grab bars by the toilet and in the tub and shower. Do not use towel bars as grab bars.  Use non-skid mats or decals in the tub or shower.  If you need to sit down in the shower, use a plastic, non-slip stool.  Keep the floor dry. Clean up any water that spills on the floor as soon as it happens.  Remove soap buildup in the tub or shower regularly.  Attach bath mats securely with double-sided non-slip rug tape.  Do not have throw rugs and other things on the floor that can make you trip. What can I do in the bedroom?  Use night lights.  Make sure that you have a light by your bed that is easy to reach.  Do not use any sheets or blankets that are too big for your bed. They should not hang down onto the floor.  Have a firm  chair that has side arms. You can use this for support while you get dressed.  Do not have throw rugs and other things on the floor that can make you trip. What can I do in the kitchen?  Clean up any spills right away.  Avoid walking on wet floors.  Keep items that you use a lot in easy-to-reach places.  If you need to reach something above you, use a strong step stool that has a grab bar.  Keep electrical cords out of the way.  Do not use floor polish or wax that makes floors slippery. If you must use wax, use non-skid floor wax.  Do not have throw rugs and other things on the floor that can make you trip. What can I do with my stairs?  Do not leave any items on the stairs.  Make sure that there are handrails on both sides of the stairs and use them. Fix handrails that are broken or loose. Make sure that handrails are as long as the stairways.  Check any carpeting to make sure that it is firmly attached to the  stairs. Fix any carpet that is loose or worn.  Avoid having throw rugs at the top or bottom of the stairs. If you do have throw rugs, attach them to the floor with carpet tape.  Make sure that you have a light switch at the top of the stairs and the bottom of the stairs. If you do not have them, ask someone to add them for you. What else can I do to help prevent falls?  Wear shoes that:  Do not have high heels.  Have rubber bottoms.  Are comfortable and fit you well.  Are closed at the toe. Do not wear sandals.  If you use a stepladder:  Make sure that it is fully opened. Do not climb a closed stepladder.  Make sure that both sides of the stepladder are locked into place.  Ask someone to hold it for you, if possible.  Clearly mark and make sure that you can see:  Any grab bars or handrails.  First and last steps.  Where the edge of each step is.  Use tools that help you move around (mobility aids) if they are needed. These  include:  Canes.  Walkers.  Scooters.  Crutches.  Turn on the lights when you go into a dark area. Replace any light bulbs as soon as they burn out.  Set up your furniture so you have a clear path. Avoid moving your furniture around.  If any of your floors are uneven, fix them.  If there are any pets around you, be aware of where they are.  Review your medicines with your doctor. Some medicines can make you feel dizzy. This can increase your chance of falling. Ask your doctor what other things that you can do to help prevent falls. This information is not intended to replace advice given to you by your health care provider. Make sure you discuss any questions you have with your health care provider. Document Released: 01/08/2009 Document Revised: 08/20/2015 Document Reviewed: 04/18/2014 Elsevier Interactive Patient Education  2017 [employer].

## 2017-09-23 DIAGNOSIS — M24542 Contracture, left hand: Secondary | ICD-10-CM | POA: Diagnosis not present

## 2017-09-23 DIAGNOSIS — A419 Sepsis, unspecified organism: Secondary | ICD-10-CM | POA: Diagnosis not present

## 2017-09-23 DIAGNOSIS — K3184 Gastroparesis: Secondary | ICD-10-CM | POA: Diagnosis not present

## 2017-09-23 DIAGNOSIS — D649 Anemia, unspecified: Secondary | ICD-10-CM | POA: Diagnosis not present

## 2017-09-23 DIAGNOSIS — Z931 Gastrostomy status: Secondary | ICD-10-CM | POA: Diagnosis not present

## 2017-09-23 DIAGNOSIS — K9422 Gastrostomy infection: Secondary | ICD-10-CM | POA: Diagnosis not present

## 2017-09-23 DIAGNOSIS — G8250 Quadriplegia, unspecified: Secondary | ICD-10-CM | POA: Diagnosis not present

## 2017-09-23 DIAGNOSIS — J9601 Acute respiratory failure with hypoxia: Secondary | ICD-10-CM | POA: Diagnosis not present

## 2017-09-23 DIAGNOSIS — K920 Hematemesis: Secondary | ICD-10-CM | POA: Diagnosis not present

## 2017-09-23 DIAGNOSIS — R4701 Aphasia: Secondary | ICD-10-CM | POA: Diagnosis not present

## 2017-09-23 DIAGNOSIS — D62 Acute posthemorrhagic anemia: Secondary | ICD-10-CM | POA: Diagnosis not present

## 2017-09-23 DIAGNOSIS — N281 Cyst of kidney, acquired: Secondary | ICD-10-CM | POA: Diagnosis not present

## 2017-09-23 DIAGNOSIS — R195 Other fecal abnormalities: Secondary | ICD-10-CM | POA: Diagnosis not present

## 2017-09-23 DIAGNOSIS — J189 Pneumonia, unspecified organism: Secondary | ICD-10-CM | POA: Diagnosis not present

## 2017-09-23 DIAGNOSIS — E876 Hypokalemia: Secondary | ICD-10-CM | POA: Diagnosis not present

## 2017-09-23 DIAGNOSIS — K2211 Ulcer of esophagus with bleeding: Secondary | ICD-10-CM | POA: Diagnosis not present

## 2017-09-23 DIAGNOSIS — K209 Esophagitis, unspecified: Secondary | ICD-10-CM | POA: Diagnosis not present

## 2017-09-24 ENCOUNTER — Encounter: Payer: Self-pay | Admitting: Internal Medicine

## 2017-09-24 DIAGNOSIS — R0602 Shortness of breath: Secondary | ICD-10-CM | POA: Diagnosis not present

## 2017-09-24 DIAGNOSIS — E871 Hypo-osmolality and hyponatremia: Secondary | ICD-10-CM | POA: Diagnosis not present

## 2017-09-24 DIAGNOSIS — R279 Unspecified lack of coordination: Secondary | ICD-10-CM | POA: Diagnosis not present

## 2017-09-24 DIAGNOSIS — G372 Central pontine myelinolysis: Secondary | ICD-10-CM | POA: Diagnosis not present

## 2017-09-24 DIAGNOSIS — J811 Chronic pulmonary edema: Secondary | ICD-10-CM | POA: Diagnosis not present

## 2017-09-24 DIAGNOSIS — E8809 Other disorders of plasma-protein metabolism, not elsewhere classified: Secondary | ICD-10-CM | POA: Diagnosis present

## 2017-09-24 DIAGNOSIS — R5081 Fever presenting with conditions classified elsewhere: Secondary | ICD-10-CM | POA: Diagnosis not present

## 2017-09-24 DIAGNOSIS — R8271 Bacteriuria: Secondary | ICD-10-CM | POA: Diagnosis not present

## 2017-09-24 DIAGNOSIS — R509 Fever, unspecified: Secondary | ICD-10-CM | POA: Diagnosis not present

## 2017-09-24 DIAGNOSIS — K208 Other esophagitis: Secondary | ICD-10-CM | POA: Diagnosis not present

## 2017-09-24 DIAGNOSIS — E876 Hypokalemia: Secondary | ICD-10-CM | POA: Diagnosis present

## 2017-09-24 DIAGNOSIS — Z9911 Dependence on respirator [ventilator] status: Secondary | ICD-10-CM | POA: Diagnosis not present

## 2017-09-24 DIAGNOSIS — Z931 Gastrostomy status: Secondary | ICD-10-CM | POA: Diagnosis not present

## 2017-09-24 DIAGNOSIS — B957 Other staphylococcus as the cause of diseases classified elsewhere: Secondary | ICD-10-CM | POA: Diagnosis not present

## 2017-09-24 DIAGNOSIS — M24532 Contracture, left wrist: Secondary | ICD-10-CM | POA: Diagnosis present

## 2017-09-24 DIAGNOSIS — Y95 Nosocomial condition: Secondary | ICD-10-CM | POA: Diagnosis not present

## 2017-09-24 DIAGNOSIS — Z7401 Bed confinement status: Secondary | ICD-10-CM | POA: Diagnosis not present

## 2017-09-24 DIAGNOSIS — K2211 Ulcer of esophagus with bleeding: Secondary | ICD-10-CM | POA: Diagnosis present

## 2017-09-24 DIAGNOSIS — R112 Nausea with vomiting, unspecified: Secondary | ICD-10-CM | POA: Diagnosis not present

## 2017-09-24 DIAGNOSIS — M25542 Pain in joints of left hand: Secondary | ICD-10-CM | POA: Diagnosis not present

## 2017-09-24 DIAGNOSIS — K2210 Ulcer of esophagus without bleeding: Secondary | ICD-10-CM | POA: Diagnosis not present

## 2017-09-24 DIAGNOSIS — Z86010 Personal history of colonic polyps: Secondary | ICD-10-CM | POA: Diagnosis not present

## 2017-09-24 DIAGNOSIS — R0603 Acute respiratory distress: Secondary | ICD-10-CM | POA: Diagnosis not present

## 2017-09-24 DIAGNOSIS — Z9981 Dependence on supplemental oxygen: Secondary | ICD-10-CM | POA: Diagnosis not present

## 2017-09-24 DIAGNOSIS — K922 Gastrointestinal hemorrhage, unspecified: Secondary | ICD-10-CM | POA: Diagnosis not present

## 2017-09-24 DIAGNOSIS — K219 Gastro-esophageal reflux disease without esophagitis: Secondary | ICD-10-CM | POA: Diagnosis present

## 2017-09-24 DIAGNOSIS — J90 Pleural effusion, not elsewhere classified: Secondary | ICD-10-CM | POA: Diagnosis not present

## 2017-09-24 DIAGNOSIS — M24542 Contracture, left hand: Secondary | ICD-10-CM | POA: Diagnosis not present

## 2017-09-24 DIAGNOSIS — E119 Type 2 diabetes mellitus without complications: Secondary | ICD-10-CM | POA: Diagnosis not present

## 2017-09-24 DIAGNOSIS — Z833 Family history of diabetes mellitus: Secondary | ICD-10-CM | POA: Diagnosis not present

## 2017-09-24 DIAGNOSIS — Z960 Presence of urogenital implants: Secondary | ICD-10-CM | POA: Diagnosis not present

## 2017-09-24 DIAGNOSIS — K449 Diaphragmatic hernia without obstruction or gangrene: Secondary | ICD-10-CM | POA: Diagnosis present

## 2017-09-24 DIAGNOSIS — J9811 Atelectasis: Secondary | ICD-10-CM | POA: Diagnosis not present

## 2017-09-24 DIAGNOSIS — J690 Pneumonitis due to inhalation of food and vomit: Secondary | ICD-10-CM | POA: Diagnosis not present

## 2017-09-24 DIAGNOSIS — M549 Dorsalgia, unspecified: Secondary | ICD-10-CM | POA: Diagnosis not present

## 2017-09-24 DIAGNOSIS — N390 Urinary tract infection, site not specified: Secondary | ICD-10-CM | POA: Diagnosis not present

## 2017-09-24 DIAGNOSIS — Z8711 Personal history of peptic ulcer disease: Secondary | ICD-10-CM | POA: Diagnosis not present

## 2017-09-24 DIAGNOSIS — E8770 Fluid overload, unspecified: Secondary | ICD-10-CM | POA: Diagnosis not present

## 2017-09-24 DIAGNOSIS — M24531 Contracture, right wrist: Secondary | ICD-10-CM | POA: Diagnosis present

## 2017-09-24 DIAGNOSIS — R600 Localized edema: Secondary | ICD-10-CM | POA: Diagnosis not present

## 2017-09-24 DIAGNOSIS — D62 Acute posthemorrhagic anemia: Secondary | ICD-10-CM | POA: Diagnosis present

## 2017-09-24 DIAGNOSIS — R109 Unspecified abdominal pain: Secondary | ICD-10-CM | POA: Diagnosis not present

## 2017-09-24 DIAGNOSIS — R488 Other symbolic dysfunctions: Secondary | ICD-10-CM | POA: Diagnosis not present

## 2017-09-24 DIAGNOSIS — K3184 Gastroparesis: Secondary | ICD-10-CM | POA: Diagnosis present

## 2017-09-24 DIAGNOSIS — Z743 Need for continuous supervision: Secondary | ICD-10-CM | POA: Diagnosis not present

## 2017-09-24 DIAGNOSIS — R0682 Tachypnea, not elsewhere classified: Secondary | ICD-10-CM | POA: Diagnosis not present

## 2017-09-24 DIAGNOSIS — R7881 Bacteremia: Secondary | ICD-10-CM | POA: Diagnosis not present

## 2017-09-24 DIAGNOSIS — K2950 Unspecified chronic gastritis without bleeding: Secondary | ICD-10-CM | POA: Diagnosis not present

## 2017-09-24 DIAGNOSIS — K920 Hematemesis: Secondary | ICD-10-CM | POA: Diagnosis not present

## 2017-09-24 DIAGNOSIS — R4702 Dysphasia: Secondary | ICD-10-CM | POA: Diagnosis not present

## 2017-09-24 DIAGNOSIS — R4701 Aphasia: Secondary | ICD-10-CM | POA: Diagnosis present

## 2017-09-24 DIAGNOSIS — T80219A Unspecified infection due to central venous catheter, initial encounter: Secondary | ICD-10-CM | POA: Diagnosis not present

## 2017-09-24 DIAGNOSIS — R1110 Vomiting, unspecified: Secondary | ICD-10-CM | POA: Diagnosis not present

## 2017-09-24 DIAGNOSIS — K9423 Gastrostomy malfunction: Secondary | ICD-10-CM | POA: Diagnosis not present

## 2017-09-24 DIAGNOSIS — Z8719 Personal history of other diseases of the digestive system: Secondary | ICD-10-CM | POA: Diagnosis not present

## 2017-09-24 DIAGNOSIS — J9601 Acute respiratory failure with hypoxia: Secondary | ICD-10-CM | POA: Diagnosis not present

## 2017-09-24 DIAGNOSIS — Z79899 Other long term (current) drug therapy: Secondary | ICD-10-CM | POA: Diagnosis not present

## 2017-09-24 DIAGNOSIS — E1143 Type 2 diabetes mellitus with diabetic autonomic (poly)neuropathy: Secondary | ICD-10-CM | POA: Diagnosis present

## 2017-09-24 DIAGNOSIS — G8250 Quadriplegia, unspecified: Secondary | ICD-10-CM | POA: Diagnosis present

## 2017-09-24 DIAGNOSIS — J9621 Acute and chronic respiratory failure with hypoxia: Secondary | ICD-10-CM | POA: Diagnosis not present

## 2017-09-24 DIAGNOSIS — Z880 Allergy status to penicillin: Secondary | ICD-10-CM | POA: Diagnosis not present

## 2017-09-24 DIAGNOSIS — G40909 Epilepsy, unspecified, not intractable, without status epilepticus: Secondary | ICD-10-CM | POA: Diagnosis present

## 2017-09-24 DIAGNOSIS — R918 Other nonspecific abnormal finding of lung field: Secondary | ICD-10-CM | POA: Diagnosis not present

## 2017-09-24 DIAGNOSIS — M24541 Contracture, right hand: Secondary | ICD-10-CM | POA: Diagnosis not present

## 2017-09-24 DIAGNOSIS — B965 Pseudomonas (aeruginosa) (mallei) (pseudomallei) as the cause of diseases classified elsewhere: Secondary | ICD-10-CM | POA: Diagnosis not present

## 2017-09-24 DIAGNOSIS — K9429 Other complications of gastrostomy: Secondary | ICD-10-CM | POA: Diagnosis not present

## 2017-09-24 DIAGNOSIS — G20 Parkinson's disease: Secondary | ICD-10-CM | POA: Diagnosis present

## 2017-09-24 DIAGNOSIS — E46 Unspecified protein-calorie malnutrition: Secondary | ICD-10-CM | POA: Diagnosis present

## 2017-09-24 DIAGNOSIS — R29898 Other symptoms and signs involving the musculoskeletal system: Secondary | ICD-10-CM | POA: Diagnosis not present

## 2017-09-24 DIAGNOSIS — J189 Pneumonia, unspecified organism: Secondary | ICD-10-CM | POA: Diagnosis not present

## 2017-09-24 DIAGNOSIS — M6249 Contracture of muscle, multiple sites: Secondary | ICD-10-CM | POA: Diagnosis not present

## 2017-09-24 DIAGNOSIS — B9689 Other specified bacterial agents as the cause of diseases classified elsewhere: Secondary | ICD-10-CM | POA: Diagnosis not present

## 2017-09-24 DIAGNOSIS — R000 Tachycardia, unspecified: Secondary | ICD-10-CM | POA: Diagnosis not present

## 2017-09-24 DIAGNOSIS — N132 Hydronephrosis with renal and ureteral calculous obstruction: Secondary | ICD-10-CM | POA: Diagnosis not present

## 2017-09-24 NOTE — Assessment & Plan Note (Signed)
Very few problems with reflux or vomiting with feeding; continue Reglan 10 mg per tube before meals and nightly

## 2017-09-24 NOTE — Assessment & Plan Note (Signed)
Chronic, reasonably stable, last hemoglobin is 11 which is little bit down from prior; plan to continue iron 330 mg per tube daily and folate 1 mg per tube daily; will monitor intervals

## 2017-09-24 NOTE — Assessment & Plan Note (Signed)
No reported seizures continues continue Dilantin 100 mg per tube twice daily

## 2017-10-17 DIAGNOSIS — Z87440 Personal history of urinary (tract) infections: Secondary | ICD-10-CM | POA: Diagnosis not present

## 2017-10-17 DIAGNOSIS — G92 Toxic encephalopathy: Secondary | ICD-10-CM | POA: Diagnosis present

## 2017-10-17 DIAGNOSIS — J690 Pneumonitis due to inhalation of food and vomit: Secondary | ICD-10-CM | POA: Diagnosis not present

## 2017-10-17 DIAGNOSIS — I499 Cardiac arrhythmia, unspecified: Secondary | ICD-10-CM | POA: Diagnosis not present

## 2017-10-17 DIAGNOSIS — Z8673 Personal history of transient ischemic attack (TIA), and cerebral infarction without residual deficits: Secondary | ICD-10-CM | POA: Diagnosis not present

## 2017-10-17 DIAGNOSIS — R4020 Unspecified coma: Secondary | ICD-10-CM | POA: Diagnosis not present

## 2017-10-17 DIAGNOSIS — E2740 Unspecified adrenocortical insufficiency: Secondary | ICD-10-CM | POA: Diagnosis present

## 2017-10-17 DIAGNOSIS — R4182 Altered mental status, unspecified: Secondary | ICD-10-CM | POA: Diagnosis not present

## 2017-10-17 DIAGNOSIS — R410 Disorientation, unspecified: Secondary | ICD-10-CM | POA: Diagnosis not present

## 2017-10-17 DIAGNOSIS — R109 Unspecified abdominal pain: Secondary | ICD-10-CM | POA: Diagnosis not present

## 2017-10-17 DIAGNOSIS — M24532 Contracture, left wrist: Secondary | ICD-10-CM | POA: Diagnosis present

## 2017-10-17 DIAGNOSIS — R063 Periodic breathing: Secondary | ICD-10-CM | POA: Diagnosis not present

## 2017-10-17 DIAGNOSIS — M4856XS Collapsed vertebra, not elsewhere classified, lumbar region, sequela of fracture: Secondary | ICD-10-CM | POA: Diagnosis not present

## 2017-10-17 DIAGNOSIS — K5901 Slow transit constipation: Secondary | ICD-10-CM | POA: Diagnosis not present

## 2017-10-17 DIAGNOSIS — R1310 Dysphagia, unspecified: Secondary | ICD-10-CM | POA: Diagnosis present

## 2017-10-17 DIAGNOSIS — G372 Central pontine myelinolysis: Secondary | ICD-10-CM | POA: Diagnosis present

## 2017-10-17 DIAGNOSIS — R400 Somnolence: Secondary | ICD-10-CM | POA: Diagnosis not present

## 2017-10-17 DIAGNOSIS — K2210 Ulcer of esophagus without bleeding: Secondary | ICD-10-CM | POA: Diagnosis not present

## 2017-10-17 DIAGNOSIS — N390 Urinary tract infection, site not specified: Secondary | ICD-10-CM | POA: Diagnosis not present

## 2017-10-17 DIAGNOSIS — I6203 Nontraumatic chronic subdural hemorrhage: Secondary | ICD-10-CM | POA: Diagnosis present

## 2017-10-17 DIAGNOSIS — M24531 Contracture, right wrist: Secondary | ICD-10-CM | POA: Diagnosis present

## 2017-10-17 DIAGNOSIS — Z86010 Personal history of colonic polyps: Secondary | ICD-10-CM | POA: Diagnosis not present

## 2017-10-17 DIAGNOSIS — Z833 Family history of diabetes mellitus: Secondary | ICD-10-CM | POA: Diagnosis not present

## 2017-10-17 DIAGNOSIS — M6240 Contracture of muscle, unspecified site: Secondary | ICD-10-CM | POA: Diagnosis not present

## 2017-10-17 DIAGNOSIS — K920 Hematemesis: Secondary | ICD-10-CM | POA: Diagnosis not present

## 2017-10-17 DIAGNOSIS — K219 Gastro-esophageal reflux disease without esophagitis: Secondary | ICD-10-CM | POA: Diagnosis present

## 2017-10-17 DIAGNOSIS — B965 Pseudomonas (aeruginosa) (mallei) (pseudomallei) as the cause of diseases classified elsewhere: Secondary | ICD-10-CM | POA: Diagnosis not present

## 2017-10-17 DIAGNOSIS — E1143 Type 2 diabetes mellitus with diabetic autonomic (poly)neuropathy: Secondary | ICD-10-CM | POA: Diagnosis present

## 2017-10-17 DIAGNOSIS — K922 Gastrointestinal hemorrhage, unspecified: Secondary | ICD-10-CM | POA: Diagnosis not present

## 2017-10-17 DIAGNOSIS — M6249 Contracture of muscle, multiple sites: Secondary | ICD-10-CM | POA: Diagnosis not present

## 2017-10-17 DIAGNOSIS — R569 Unspecified convulsions: Secondary | ICD-10-CM | POA: Diagnosis not present

## 2017-10-17 DIAGNOSIS — G8250 Quadriplegia, unspecified: Secondary | ICD-10-CM | POA: Diagnosis present

## 2017-10-17 DIAGNOSIS — L89151 Pressure ulcer of sacral region, stage 1: Secondary | ICD-10-CM | POA: Diagnosis present

## 2017-10-17 DIAGNOSIS — J9602 Acute respiratory failure with hypercapnia: Secondary | ICD-10-CM | POA: Diagnosis not present

## 2017-10-17 DIAGNOSIS — D638 Anemia in other chronic diseases classified elsewhere: Secondary | ICD-10-CM | POA: Diagnosis present

## 2017-10-17 DIAGNOSIS — R279 Unspecified lack of coordination: Secondary | ICD-10-CM | POA: Diagnosis not present

## 2017-10-17 DIAGNOSIS — Z931 Gastrostomy status: Secondary | ICD-10-CM | POA: Diagnosis not present

## 2017-10-17 DIAGNOSIS — K3184 Gastroparesis: Secondary | ICD-10-CM | POA: Diagnosis present

## 2017-10-17 DIAGNOSIS — J80 Acute respiratory distress syndrome: Secondary | ICD-10-CM | POA: Diagnosis not present

## 2017-10-17 DIAGNOSIS — M24541 Contracture, right hand: Secondary | ICD-10-CM | POA: Diagnosis not present

## 2017-10-17 DIAGNOSIS — R4189 Other symptoms and signs involving cognitive functions and awareness: Secondary | ICD-10-CM | POA: Diagnosis present

## 2017-10-17 DIAGNOSIS — N132 Hydronephrosis with renal and ureteral calculous obstruction: Secondary | ICD-10-CM | POA: Diagnosis not present

## 2017-10-17 DIAGNOSIS — R488 Other symbolic dysfunctions: Secondary | ICD-10-CM | POA: Diagnosis not present

## 2017-10-17 DIAGNOSIS — Z743 Need for continuous supervision: Secondary | ICD-10-CM | POA: Diagnosis not present

## 2017-10-17 DIAGNOSIS — J189 Pneumonia, unspecified organism: Secondary | ICD-10-CM | POA: Diagnosis not present

## 2017-10-17 DIAGNOSIS — R0602 Shortness of breath: Secondary | ICD-10-CM | POA: Diagnosis not present

## 2017-10-17 DIAGNOSIS — G20 Parkinson's disease: Secondary | ICD-10-CM | POA: Diagnosis present

## 2017-10-17 DIAGNOSIS — M24542 Contracture, left hand: Secondary | ICD-10-CM | POA: Diagnosis not present

## 2017-10-17 DIAGNOSIS — E8779 Other fluid overload: Secondary | ICD-10-CM | POA: Diagnosis not present

## 2017-10-18 ENCOUNTER — Non-Acute Institutional Stay (SKILLED_NURSING_FACILITY): Payer: Medicare Other | Admitting: Internal Medicine

## 2017-10-18 ENCOUNTER — Encounter: Payer: Self-pay | Admitting: Internal Medicine

## 2017-10-18 DIAGNOSIS — K2210 Ulcer of esophagus without bleeding: Secondary | ICD-10-CM | POA: Diagnosis not present

## 2017-10-18 DIAGNOSIS — E8779 Other fluid overload: Secondary | ICD-10-CM

## 2017-10-18 DIAGNOSIS — B965 Pseudomonas (aeruginosa) (mallei) (pseudomallei) as the cause of diseases classified elsewhere: Secondary | ICD-10-CM | POA: Diagnosis not present

## 2017-10-18 DIAGNOSIS — R4020 Unspecified coma: Secondary | ICD-10-CM | POA: Diagnosis not present

## 2017-10-18 DIAGNOSIS — E119 Type 2 diabetes mellitus without complications: Secondary | ICD-10-CM | POA: Diagnosis not present

## 2017-10-18 DIAGNOSIS — G372 Central pontine myelinolysis: Secondary | ICD-10-CM | POA: Diagnosis present

## 2017-10-18 DIAGNOSIS — R0602 Shortness of breath: Secondary | ICD-10-CM | POA: Diagnosis not present

## 2017-10-18 DIAGNOSIS — R4701 Aphasia: Secondary | ICD-10-CM | POA: Diagnosis not present

## 2017-10-18 DIAGNOSIS — R400 Somnolence: Secondary | ICD-10-CM | POA: Diagnosis not present

## 2017-10-18 DIAGNOSIS — K3184 Gastroparesis: Secondary | ICD-10-CM | POA: Diagnosis present

## 2017-10-18 DIAGNOSIS — E2740 Unspecified adrenocortical insufficiency: Secondary | ICD-10-CM | POA: Diagnosis present

## 2017-10-18 DIAGNOSIS — Z431 Encounter for attention to gastrostomy: Secondary | ICD-10-CM | POA: Diagnosis not present

## 2017-10-18 DIAGNOSIS — N132 Hydronephrosis with renal and ureteral calculous obstruction: Secondary | ICD-10-CM

## 2017-10-18 DIAGNOSIS — Z86010 Personal history of colonic polyps: Secondary | ICD-10-CM | POA: Diagnosis not present

## 2017-10-18 DIAGNOSIS — R4182 Altered mental status, unspecified: Secondary | ICD-10-CM | POA: Diagnosis not present

## 2017-10-18 DIAGNOSIS — N390 Urinary tract infection, site not specified: Secondary | ICD-10-CM

## 2017-10-18 DIAGNOSIS — J80 Acute respiratory distress syndrome: Secondary | ICD-10-CM | POA: Diagnosis not present

## 2017-10-18 DIAGNOSIS — M4856XS Collapsed vertebra, not elsewhere classified, lumbar region, sequela of fracture: Secondary | ICD-10-CM

## 2017-10-18 DIAGNOSIS — J9602 Acute respiratory failure with hypercapnia: Secondary | ICD-10-CM

## 2017-10-18 DIAGNOSIS — Z87440 Personal history of urinary (tract) infections: Secondary | ICD-10-CM | POA: Diagnosis not present

## 2017-10-18 DIAGNOSIS — Z8673 Personal history of transient ischemic attack (TIA), and cerebral infarction without residual deficits: Secondary | ICD-10-CM | POA: Diagnosis not present

## 2017-10-18 DIAGNOSIS — I6203 Nontraumatic chronic subdural hemorrhage: Secondary | ICD-10-CM | POA: Diagnosis present

## 2017-10-18 DIAGNOSIS — R410 Disorientation, unspecified: Secondary | ICD-10-CM | POA: Diagnosis not present

## 2017-10-18 DIAGNOSIS — Z833 Family history of diabetes mellitus: Secondary | ICD-10-CM | POA: Diagnosis not present

## 2017-10-18 DIAGNOSIS — M24531 Contracture, right wrist: Secondary | ICD-10-CM | POA: Diagnosis present

## 2017-10-18 DIAGNOSIS — R063 Periodic breathing: Secondary | ICD-10-CM

## 2017-10-18 DIAGNOSIS — G92 Toxic encephalopathy: Secondary | ICD-10-CM | POA: Diagnosis not present

## 2017-10-18 DIAGNOSIS — M24532 Contracture, left wrist: Secondary | ICD-10-CM | POA: Diagnosis present

## 2017-10-18 DIAGNOSIS — R488 Other symbolic dysfunctions: Secondary | ICD-10-CM | POA: Diagnosis not present

## 2017-10-18 DIAGNOSIS — K920 Hematemesis: Secondary | ICD-10-CM | POA: Diagnosis not present

## 2017-10-18 DIAGNOSIS — M24541 Contracture, right hand: Secondary | ICD-10-CM | POA: Diagnosis not present

## 2017-10-18 DIAGNOSIS — K922 Gastrointestinal hemorrhage, unspecified: Secondary | ICD-10-CM | POA: Diagnosis not present

## 2017-10-18 DIAGNOSIS — L89151 Pressure ulcer of sacral region, stage 1: Secondary | ICD-10-CM | POA: Diagnosis present

## 2017-10-18 DIAGNOSIS — E1143 Type 2 diabetes mellitus with diabetic autonomic (poly)neuropathy: Secondary | ICD-10-CM | POA: Diagnosis present

## 2017-10-18 DIAGNOSIS — G20 Parkinson's disease: Secondary | ICD-10-CM | POA: Diagnosis present

## 2017-10-18 DIAGNOSIS — Z931 Gastrostomy status: Secondary | ICD-10-CM | POA: Diagnosis not present

## 2017-10-18 DIAGNOSIS — G939 Disorder of brain, unspecified: Secondary | ICD-10-CM | POA: Diagnosis not present

## 2017-10-18 DIAGNOSIS — K219 Gastro-esophageal reflux disease without esophagitis: Secondary | ICD-10-CM | POA: Diagnosis present

## 2017-10-18 DIAGNOSIS — M6281 Muscle weakness (generalized): Secondary | ICD-10-CM | POA: Diagnosis not present

## 2017-10-18 DIAGNOSIS — R29898 Other symptoms and signs involving the musculoskeletal system: Secondary | ICD-10-CM | POA: Diagnosis not present

## 2017-10-18 DIAGNOSIS — J690 Pneumonitis due to inhalation of food and vomit: Secondary | ICD-10-CM | POA: Diagnosis not present

## 2017-10-18 DIAGNOSIS — D638 Anemia in other chronic diseases classified elsewhere: Secondary | ICD-10-CM | POA: Diagnosis present

## 2017-10-18 DIAGNOSIS — R4189 Other symptoms and signs involving cognitive functions and awareness: Secondary | ICD-10-CM | POA: Diagnosis present

## 2017-10-18 DIAGNOSIS — M24542 Contracture, left hand: Secondary | ICD-10-CM | POA: Diagnosis not present

## 2017-10-18 DIAGNOSIS — M6240 Contracture of muscle, unspecified site: Secondary | ICD-10-CM | POA: Diagnosis not present

## 2017-10-18 DIAGNOSIS — K5901 Slow transit constipation: Secondary | ICD-10-CM | POA: Diagnosis not present

## 2017-10-18 DIAGNOSIS — J189 Pneumonia, unspecified organism: Secondary | ICD-10-CM | POA: Diagnosis not present

## 2017-10-18 DIAGNOSIS — R1310 Dysphagia, unspecified: Secondary | ICD-10-CM | POA: Diagnosis present

## 2017-10-18 DIAGNOSIS — M6249 Contracture of muscle, multiple sites: Secondary | ICD-10-CM | POA: Diagnosis not present

## 2017-10-18 DIAGNOSIS — G8250 Quadriplegia, unspecified: Secondary | ICD-10-CM | POA: Diagnosis not present

## 2017-10-18 DIAGNOSIS — R471 Dysarthria and anarthria: Secondary | ICD-10-CM | POA: Diagnosis not present

## 2017-10-18 DIAGNOSIS — R109 Unspecified abdominal pain: Secondary | ICD-10-CM | POA: Diagnosis not present

## 2017-10-18 DIAGNOSIS — I499 Cardiac arrhythmia, unspecified: Secondary | ICD-10-CM | POA: Diagnosis not present

## 2017-10-18 DIAGNOSIS — R569 Unspecified convulsions: Secondary | ICD-10-CM | POA: Diagnosis not present

## 2017-10-18 NOTE — Progress Notes (Signed)
:  Location:  [facility] Room Number: [phone] Place of Service:  SNF (31)  [name]. [name] Coil, MD  Patient Care Team: [name], MD as PCP - General (Internal Medicine)  Extended Emergency Contact Information Primary Emergency Contact: Deroche,[name] Address: [address]          [place], [place] [address] [name] Phone: [phone] Mobile Phone: [phone] Relation: Mother Secondary Emergency Contact: Constance Haw [place] Phone: [phone] Relation: Brother Father: Tao, Satz [place] Mobile Phone: [phone]     Allergies: Aspirin; Keflex [cephalexin]; Nsaids; and Penicillins  Chief Complaint  Patient presents with  . Readmit To SNF    Admit to [facility]    HPI: Patient is 62 y.o. male with quadriplegia, history of central pontine melanosis with quadriparesis, contracture of upper extremities, history of seizure disorder, dysphasia on chronic PEG tube, who was sent from skilled nursing facility because of nausea vomiting and hematemesis on 6/29 2019.  Patient was admitted to [facility] from 6/29-7/23 where he was treated for multiple problems.  Patient had an EGD on 7/1 which showed erosive esophagitis and patient was started on Protonix 40 mg twice daily.  Patient was also found to have a Pseudomonas UTI complicated by left ureteral calculus and had a double-J stent placed in the left ureter which was removed on 7/18.  Patient's hospital course was complicated by acute pulmonary edema which required admission to the ICU and treatment with the Lasix drip.  Patient was able to be weaned off oxygen and was admitted back onto the floor.  Patient is discharged on room air.  Chest x-ray on 7/21 showed mildly improved changes of congestive heart failure with no significant change in the patchy atelectasis of the left lung base.  Patient is admitted back to  skilled nursing facility for residential care.  While at skilled nursing facility patient will be followed for adrenal insufficiency treated with prednisone, iron deficiency anemia treated with iron and gastroparesis treated with Reglan.  However on exam for H&P the patient appears extremely pale and mildly somnolent with Cheyne-Stokes breathing which is not his baseline.  Patient is being sent back to [facility], it appears he should not have been discharged.  Past Medical History:  Diagnosis Date  . Acute encephalopathy 01/02/2014  . Adrenal insufficiency ([place]) 11/08/2011  . Adrenocortical insufficiency ([place])   . Allergic rhinitis   . Anemia    Iron deficient  . Aphasia 06/19/2013  . Cataract   . Clostridium difficile colitis 02/2014  . Constipation   . Contracture of joint of left hand   . Depression   . Diabetic gastroparesis ([place]) 11/08/2011  . Dysphagia 06/19/2013  . Gastroesophageal reflux disease 03/17/2015  . GERD (gastroesophageal reflux disease)   . Glucocorticoid deficiency ([place]) 08/10/2012  . History of colon polyps 05/22/2015  . Ileus ([place]) 11/08/2011  . Impaired verbal communication   . Iron deficiency anemia 08/10/2012  . Lung nodule 05/22/2015  . Nontraumatic chronic subdural hemorrhage ([place])   . Parkinson disease ([place])   . Parkinson's disease ([place])   . PEG (percutaneous endoscopic gastrostomy) status ([place]) 10/04/2013  . Pneumonia 07/2014   aspiration pneumonia  . Pyelonephritis   . Quadriplegia ([place])   . S/P percutaneous endoscopic gastrostomy (PEG) tube placement ([place])   . Seizures ([place])    last occurance 2015  . SIRS (systemic inflammatory response syndrome) (HCC)  08/06/2014  . Subdural hematoma ([place]) 01/02/2014  . Type 2 diabetes mellitus without complication ([place]) 32/95/1884  . UTI (urinary tract infection) 07/2014  . Vitamin D deficiency 10/16/2016  . Wheelchair bound     Past Surgical History:  Procedure Laterality Date  . COLONOSCOPY    .  ESOPHAGOGASTRODUODENOSCOPY    . PEG TUBE PLACEMENT    . PERIPHERALLY INSERTED CENTRAL CATHETER INSERTION    . TONSILLECTOMY  1962    Allergies as of 10/18/2017      Reactions   Aspirin Other (See Comments)   Unknown reaction; "blood doesn't clog too well" per mother.    Keflex [cephalexin]    Rash   Nsaids Other (See Comments)   Unknown reaction   Penicillins Hives, Other (See Comments)   Unknown reaction.  Pt has tolerated cephalosporins in the past.      Medication List        Accurate as of 10/18/17 11:59 PM. Always use your most recent med list.          acetaminophen 325 MG tablet Commonly known as:  TYLENOL Place 650 mg into feeding tube every 6 (six) hours as needed. Fever > 101   baclofen 10 MG tablet Commonly known as:  LIORESAL Place 1 tablet (10 mg total) into feeding tube 3 (three) times daily.   bisacodyl 10 MG suppository Commonly known as:  DULCOLAX Place 10 mg rectally daily as needed for moderate constipation. If constipation not relieved by milk of magnesia give one 10 mg suppository in 24hours   CENTAMIN Liqd 5 mLs by PEG Tube route daily.   CETAPHIL GENTLE CLEANSER Liqd Apply to face and neck with water and then pat dry daily.   Cholecalciferol 5000 UNIT/ML Liqd Take by mouth. 10 mls by Per G Tube route once a week. Wednesdays   DILANTIN 125 MG/5ML suspension Generic drug:  phenytoin Place 100 mg into feeding tube 2 (two) times daily.   feeding supplement (JEVITY 1.5 CAL) Liqd Place into feeding tube. Initiate Jevity 1.5 cal @@ 58 ml/hr continuous x 20 hr via peg ( hold 1 hr pre/post phenton administration)   ferrous sulfate 220 (44 Fe) MG/5ML solution Place 330 mg into feeding tube See admin instructions. Give 7.82ml  Per tube every morning   folic acid 1 MG tablet Commonly known as:  FOLVITE 1 mg by PEG Tube route daily.   loratadine 10 MG tablet Commonly known as:  CLARITIN 10 mg by PEG Tube route daily.   magnesium hydroxide 400  MG/5ML suspension Commonly known as:  MILK OF MAGNESIA Take 30 mLs by mouth daily as needed for mild constipation.   magnesium oxide 400 MG tablet Commonly known as:  MAG-OX Take 400 mg by mouth 2 (two) times daily.   metoCLOPramide 10 MG tablet Commonly known as:  REGLAN Place 1 tablet (10 mg total) into feeding tube 4 (four) times daily -  before meals and at bedtime. Via Peg tube   olopatadine 0.1 % ophthalmic solution Commonly known as:  PATANOL Place 1 drop into both eyes every morning.   pantoprazole sodium 40 mg/20 mL Pack Commonly known as:  PROTONIX Take by mouth 2 (two) times daily.   polyethylene glycol packet Commonly known as:  MIRALAX / GLYCOLAX Place 17 g into feeding tube daily as needed.   polyvinyl alcohol 1.4 % ophthalmic solution Commonly known as:  LIQUIFILM TEARS Place 1 drop into both eyes daily at 6 (six) AM.   prednisoLONE 15 MG/5ML  Soln Commonly known as:  PRELONE Place into feeding tube. Give 6 mg every morning and 4 mg every evening at 6 pm   promethazine 25 MG tablet Commonly known as:  PHENERGAN Place 25 mg into feeding tube every 6 (six) hours as needed for nausea or vomiting.   RA SALINE ENEMA 19-7 GM/118ML Enem Place 1 each rectally as needed (for constipation).   sterile water for irrigation Flush 255ml of water into peg tub every 4 hours       No orders of the defined types were placed in this encounter.   Immunization History  Administered Date(s) Administered  . Influenza Whole 12/31/2012  . Influenza-Unspecified 12/29/2011, 12/31/2012, 12/22/2014, 01/22/2016, 01/05/2017  . PPD Test 08/22/2008  . Pneumococcal-Unspecified 06/23/2010, 06/23/2015    Social History   Tobacco Use  . Smoking status: Never Smoker  . Smokeless tobacco: Never Used  Substance Use Topics  . Alcohol use: No    Family history is   Family History  Problem Relation Age of Onset  . Hypothyroidism Mother   . Hypertension Mother   . Diabetes  Father   . Hypertension Father   . Diabetes Paternal Grandfather       Review of Systems  DATA OBTAINED: From nurse,  family member-mother GENERAL: Not acting right SKIN: pale EYES: No eye pain, redness, discharge EARS: No earache, tinnitus, change in hearing NOSE: No congestion, drainage or bleeding  MOUTH/THROAT: No mouth or tooth pain, No sore throat RESPIRATORY: No cough, wheezing, SOB CARDIAC: No chest pain, palpitations, lower extremity edema  GI: No abdominal pain, No N/V/D or constipation, No heartburn or reflux  GU: No dysuria, frequency or urgency, or incontinence  MUSCULOSKELETAL: No unrelieved bone/joint pain NEUROLOGIC: No headache, dizziness or focal weakness PSYCHIATRIC: No c/o anxiety or sadness   Vitals:   10/18/17 1125  BP: 110/64  Pulse: 89  Resp: 16  Temp: 98.6 F (37 C)    SpO2 Readings from Last 1 Encounters:  09/22/17 92%   Body mass index is 23.92 kg/m.     Physical Exam  GENERAL APPEARANCE: Mildly somnolent SKIN: Very pale HEAD: Normocephalic, atraumatic  EYES: Conjunctiva/lids clear. Pupils round, reactive. EOMs intact.  EARS: External exam WNL, canals clear. Hearing grossly normal.  NOSE: No deformity or discharge.  MOUTH/THROAT: Lips w/o lesions  RESPIRATORY: Breathing is cheyne Stokes CARDIOVASCULAR: Heart RRR no murmurs, rubs or gallops. No peripheral edema.   GASTROINTESTINAL: Abdomen is soft, non-tender, not distended w/ normal bowel sounds. GENITOURINARY: Bladder non tender, not distended  MUSCULOSKELETAL-wasting and contractures NEUROLOGIC:  Cranial nerves 2-12 grossly intact: Paraplegia PSYCHIATRIC: Somnolent  Patient Active Problem List   Diagnosis Date Noted  . Hyperkalemia 10/26/2017  . Urinary tract infection due to Proteus 09/21/2017  . Infection of PEG site ([place]) 09/21/2017  . Hydronephrosis with renal and ureteral calculus obstruction 09/21/2017  . Left ureteral calculus 09/21/2017  . Flexion contractures  09/21/2017  . Vitamin D deficiency 10/16/2016  . Anemia   . GERD (gastroesophageal reflux disease)   . Clostridium difficile colitis   . Adrenocortical insufficiency ([place])   . Cataract 06/13/2016  . Constipation 06/13/2016  . Contracture of joint of left hand 06/13/2016  . Impaired verbal communication 06/13/2016  . Nontraumatic chronic subdural hemorrhage ([place]) 06/13/2016  . Nonverbal 06/13/2016  . Wheelchair bound 06/13/2016  . Allergic rhinitis 06/13/2016  . S/P percutaneous endoscopic gastrostomy (PEG) tube placement ([place]) 06/13/2016  . Itchy eyes 11/12/2015  . Benign neoplasm of colon 05/22/2015  . History  of colon polyps 05/22/2015  . Lung nodule 05/22/2015  . Abnormal levels of other serum enzymes 05/22/2015  . Gastroesophageal reflux disease 03/17/2015  . Tinea cruris 09/10/2014  . Aspiration pneumonia ([place]) 08/15/2014  . Abscess, prostate 08/15/2014  . Pyelonephritis 08/06/2014  . Nausea & vomiting   . Fever presenting with conditions classified elsewhere 03/16/2014  . Altered mental status 03/16/2014  . Acute bronchiolitis due to unspecified organism 03/11/2014  . Enteritis due to Clostridium difficile 03/11/2014  . Type 2 diabetes mellitus without complication ([place]) 05/39/7673  . Septic shock ([place]) 01/26/2014  . Sepsis ([place]) 01/25/2014  . Acute encephalopathy 01/02/2014  . Acute respiratory failure with hypercapnia ([place]) 01/02/2014  . Chronic anemia 01/02/2014  . Subdural hematoma ([place]) 01/02/2014  . Pain in thoracic spine 12/26/2013  . PEG (percutaneous endoscopic gastrostomy) status ([place]) 10/04/2013  . Rash and nonspecific skin eruption 07/08/2013  . Thrush 07/08/2013  . Heme positive stool 07/07/2013  . Severe sepsis ([place]) 06/23/2013  . E. coli pyelonephritis 06/22/2013  . SIRS (systemic inflammatory response syndrome) ([place]) 06/19/2013  . Leukocytosis 06/19/2013  . Bronchitis 06/19/2013  . Dysphagia 06/19/2013  . Aphasia 06/19/2013  . Other convulsions  08/10/2012  . Iron deficiency anemia 08/10/2012  . Glucocorticoid deficiency ([place]) 08/10/2012  . Febrile illness, acute 06/15/2012  . UTI (lower urinary tract infection) 06/15/2012  . Hypokalemia 11/10/2011  . Diabetic gastroparesis ([place]) 11/08/2011  . Ileus ([place]) 11/08/2011  . Hyponatremia 11/08/2011  . Adrenal insufficiency ([place]) 11/08/2011  . Quadriplegia ([place]) 11/08/2011  . Parkinson disease ([place])   . Seizures ([place])   . Depression       Labs reviewed: Basic Metabolic Panel:    Component Value Date/Time   NA 137 10/25/2017   NA 135 09/18/2017   K 5.3 10/25/2017   K 4.0 09/18/2017   CL 108 06/26/2016 1424   CO2 26 06/26/2016 1424   GLUCOSE 107 (H) 06/26/2016 1424   BUN 11 10/25/2017   CREATININE 0.5 (A) 10/25/2017   CREATININE 0.49 09/18/2017   CALCIUM 8.6 09/18/2017   PROT 8.3 (H) 06/26/2016 1424   ALBUMIN 3.6 06/26/2016 1424   AST 22 05/26/2017   ALT 15 05/26/2017   ALKPHOS 106 05/26/2017   BILITOT 0.5 06/26/2016 1424   GFRNONAA >60 06/26/2016 1424   GFRAA >90 09/18/2017    Recent Labs    06/05/17 09/18/17 10/25/17  NA 138 135 137  K 3.9 4.0 5.3  BUN 16 12 11   CREATININE 0.5* 0.49 0.5*  CALCIUM  --  8.6  --    Liver Function Tests: Recent Labs    05/26/17  AST 22  ALT 15  ALKPHOS 106   No results for input(s): LIPASE, AMYLASE in the last 8760 hours. No results for input(s): AMMONIA in the last 8760 hours. CBC: Recent Labs    06/05/17 09/18/17 10/25/17  WBC 7.4 7.5 9.7  HGB 11.6* 11.0* 9.8*  HCT 38* 34* 30*  PLT 200 298 331   Lipid No results for input(s): CHOL, HDL, LDLCALC, TRIG in the last 8760 hours.  Cardiac Enzymes: No results for input(s): CKTOTAL, CKMB, CKMBINDEX, TROPONINI in the last 8760 hours. BNP: No results for input(s): BNP in the last 8760 hours. Lab Results  Component Value Date   MICROALBUR 10.4 12/01/2016   Lab Results  Component Value Date   HGBA1C 5.0 03/24/2017   Lab Results  Component Value Date   TSH 1.78  09/13/2016   Lab Results  Component Value Date  CVELFYBO17 905 01/29/2014   No results found for: FOLATE Lab Results  Component Value Date   IRON 48 01/29/2014   TIBC 125 (L) 01/29/2014    Imaging and Procedures obtained prior to SNF admission: Dg Abd Acute W/chest  Result Date: 06/26/2016 CLINICAL DATA:  Nausea and vomiting. Parkinson disease. Gastrostomy tube feeding. Quadriplegia. EXAM: DG ABDOMEN ACUTE W/ 1V CHEST COMPARISON:  01/11/2016 chest radiograph. 11/26/2014 CT abdomen/pelvis. FINDINGS: Stable cardiomediastinal silhouette with normal heart size and aortic atherosclerosis. No pneumothorax. No pleural effusion. Slightly low lung volumes. No pulmonary edema. No acute consolidative airspace disease. Percutaneous gastrostomy tube is seen terminating in the left upper quadrant of the abdomen. No disproportionately dilated small bowel loops or significant air-fluid levels . Moderate colorectal stool volume. No evidence of pneumatosis or pneumoperitoneum. No radiopaque urolithiasis. IMPRESSION: 1. No active disease in the chest . 2. Percutaneous gastrostomy tube terminates in the left upper quadrant of the abdomen. No evidence of free intraperitoneal air. 3. Nonobstructive bowel gas pattern . 4. Moderate colorectal stool volume. Electronically Signed   By: Ilona Sorrel M.D.   On: 06/26/2016 14:57     Not all labs, radiology exams or other studies done during hospitalization come through on my EPIC note; however they are reviewed by me.    Assessment and Plan  Hematemesis/upper GI bleed-presentation for same; EGD showed severe erosive esophagitis; patient was started on Protonix 40 mg twice daily; patient's hemoglobin remained stable SNF -has been admitted to skilled nursing facility last night for residential care but this morning is found to be acutely ill and is going to be sent back to [facility]  Pseudomonas UTI/obstructive uropathy patient with a history of a left  distal ureteral stone for which double-J stent had been placed which was removed during this hospitalization; patient was treated with an unspecified antibiotic and said to have a full course  Acute respiratory failure with hypoxia volume overload- treated in ICU with Lasix drip with resolution; patient was discharged back to the floor and discharged to skilled nursing facility on room air; chest x-ray on 7/21 showed mildly improved changes in congestive heart failure and no change in the patchy atelectasis of the left lung base signs are clear the patient initiated Stoke breathing SNF -this morning lung sounds are clear but patient is Cheyne-Stokes breathing; patient is being sent back to [place]  History of back pain- x-rays of LS spine showed compression fractures involving T12, superior L2 and L3 and L5 vertebral bodies these were stable; there was a slight compression fractures of the superior endplate of L2 that appeared to be new since 2003 but of indeterminate age SNF -patient has pain medicine he can take as needed  Patient appears unstable at this time with a very pale skin, somnolence, and Cheyne-Stokes breathing-patient is being sent back to [facility]   Time spent greater than 45 minutes;;; patient being seen for routine H&P and found to be unstable acutely and requiring transfer to [facility] right [name]. [name] Coil, MD

## 2017-10-19 ENCOUNTER — Other Ambulatory Visit: Payer: Self-pay | Admitting: *Deleted

## 2017-10-19 NOTE — Patient Outreach (Signed)
[name]  10/19/2017  [name] [birth year] [phone]   Collaboration with [place] she reports patient is a long term care patient at the facility.  No [facility] care management needs at this time.   Rutherford Limerick RN, BSN [facility] Acute Care Coordinator  [phone]) Business Mobile [phone]) Toll free office

## 2017-10-20 DIAGNOSIS — R471 Dysarthria and anarthria: Secondary | ICD-10-CM | POA: Diagnosis not present

## 2017-10-20 DIAGNOSIS — R29898 Other symptoms and signs involving the musculoskeletal system: Secondary | ICD-10-CM | POA: Diagnosis not present

## 2017-10-20 DIAGNOSIS — K3184 Gastroparesis: Secondary | ICD-10-CM | POA: Diagnosis not present

## 2017-10-20 DIAGNOSIS — D638 Anemia in other chronic diseases classified elsewhere: Secondary | ICD-10-CM | POA: Diagnosis not present

## 2017-10-20 DIAGNOSIS — M24531 Contracture, right wrist: Secondary | ICD-10-CM | POA: Diagnosis not present

## 2017-10-20 DIAGNOSIS — H2513 Age-related nuclear cataract, bilateral: Secondary | ICD-10-CM | POA: Diagnosis not present

## 2017-10-20 DIAGNOSIS — M6249 Contracture of muscle, multiple sites: Secondary | ICD-10-CM | POA: Diagnosis not present

## 2017-10-20 DIAGNOSIS — E2740 Unspecified adrenocortical insufficiency: Secondary | ICD-10-CM | POA: Diagnosis not present

## 2017-10-20 DIAGNOSIS — R4182 Altered mental status, unspecified: Secondary | ICD-10-CM | POA: Diagnosis not present

## 2017-10-20 DIAGNOSIS — M3501 Sicca syndrome with keratoconjunctivitis: Secondary | ICD-10-CM | POA: Diagnosis not present

## 2017-10-20 DIAGNOSIS — I6203 Nontraumatic chronic subdural hemorrhage: Secondary | ICD-10-CM | POA: Diagnosis not present

## 2017-10-20 DIAGNOSIS — Z431 Encounter for attention to gastrostomy: Secondary | ICD-10-CM | POA: Diagnosis not present

## 2017-10-20 DIAGNOSIS — E1143 Type 2 diabetes mellitus with diabetic autonomic (poly)neuropathy: Secondary | ICD-10-CM | POA: Diagnosis not present

## 2017-10-20 DIAGNOSIS — D508 Other iron deficiency anemias: Secondary | ICD-10-CM | POA: Diagnosis not present

## 2017-10-20 DIAGNOSIS — J690 Pneumonitis due to inhalation of food and vomit: Secondary | ICD-10-CM | POA: Diagnosis not present

## 2017-10-20 DIAGNOSIS — R569 Unspecified convulsions: Secondary | ICD-10-CM | POA: Diagnosis not present

## 2017-10-20 DIAGNOSIS — G372 Central pontine myelinolysis: Secondary | ICD-10-CM | POA: Diagnosis not present

## 2017-10-20 DIAGNOSIS — M24532 Contracture, left wrist: Secondary | ICD-10-CM | POA: Diagnosis not present

## 2017-10-20 DIAGNOSIS — K219 Gastro-esophageal reflux disease without esophagitis: Secondary | ICD-10-CM | POA: Diagnosis not present

## 2017-10-20 DIAGNOSIS — R488 Other symbolic dysfunctions: Secondary | ICD-10-CM | POA: Diagnosis not present

## 2017-10-20 DIAGNOSIS — M24542 Contracture, left hand: Secondary | ICD-10-CM | POA: Diagnosis not present

## 2017-10-20 DIAGNOSIS — R1310 Dysphagia, unspecified: Secondary | ICD-10-CM | POA: Diagnosis not present

## 2017-10-20 DIAGNOSIS — E875 Hyperkalemia: Secondary | ICD-10-CM | POA: Diagnosis not present

## 2017-10-20 DIAGNOSIS — Z931 Gastrostomy status: Secondary | ICD-10-CM | POA: Diagnosis not present

## 2017-10-20 DIAGNOSIS — G8250 Quadriplegia, unspecified: Secondary | ICD-10-CM | POA: Diagnosis not present

## 2017-10-20 DIAGNOSIS — E119 Type 2 diabetes mellitus without complications: Secondary | ICD-10-CM | POA: Diagnosis not present

## 2017-10-20 DIAGNOSIS — M24541 Contracture, right hand: Secondary | ICD-10-CM | POA: Diagnosis not present

## 2017-10-20 DIAGNOSIS — H04123 Dry eye syndrome of bilateral lacrimal glands: Secondary | ICD-10-CM | POA: Diagnosis not present

## 2017-10-20 DIAGNOSIS — G20 Parkinson's disease: Secondary | ICD-10-CM | POA: Diagnosis not present

## 2017-10-20 DIAGNOSIS — R4701 Aphasia: Secondary | ICD-10-CM | POA: Diagnosis not present

## 2017-10-20 DIAGNOSIS — M6281 Muscle weakness (generalized): Secondary | ICD-10-CM | POA: Diagnosis not present

## 2017-10-25 LAB — BASIC METABOLIC PANEL
BUN: 11 (ref 4–21)
Creatinine: 0.5 — AB (ref 0.6–1.3)
GLUCOSE: 113
POTASSIUM: 5.3 (ref 3.4–5.3)
Sodium: 137 (ref 137–147)

## 2017-10-25 LAB — CBC AND DIFFERENTIAL
HEMATOCRIT: 30 — AB (ref 41–53)
HEMOGLOBIN: 9.8 — AB (ref 13.5–17.5)
Platelets: 331 (ref 150–399)
WBC: 9.7

## 2017-10-26 ENCOUNTER — Encounter: Payer: Self-pay | Admitting: Adult Health

## 2017-10-26 ENCOUNTER — Non-Acute Institutional Stay (SKILLED_NURSING_FACILITY): Payer: Medicare Other | Admitting: Adult Health

## 2017-10-26 DIAGNOSIS — K219 Gastro-esophageal reflux disease without esophagitis: Secondary | ICD-10-CM

## 2017-10-26 DIAGNOSIS — H04123 Dry eye syndrome of bilateral lacrimal glands: Secondary | ICD-10-CM | POA: Diagnosis not present

## 2017-10-26 DIAGNOSIS — E875 Hyperkalemia: Secondary | ICD-10-CM | POA: Insufficient documentation

## 2017-10-26 DIAGNOSIS — D508 Other iron deficiency anemias: Secondary | ICD-10-CM

## 2017-10-26 DIAGNOSIS — H2513 Age-related nuclear cataract, bilateral: Secondary | ICD-10-CM | POA: Diagnosis not present

## 2017-10-26 DIAGNOSIS — R1310 Dysphagia, unspecified: Secondary | ICD-10-CM

## 2017-10-26 DIAGNOSIS — E119 Type 2 diabetes mellitus without complications: Secondary | ICD-10-CM | POA: Diagnosis not present

## 2017-10-26 DIAGNOSIS — M3501 Sicca syndrome with keratoconjunctivitis: Secondary | ICD-10-CM | POA: Diagnosis not present

## 2017-10-26 NOTE — Progress Notes (Signed)
[facility] Room Number: 016W Place of Service:  SNF (31)   CODE STATUS: FULL CODE  Allergies  Allergen Reactions  . Aspirin Other (See Comments)    Unknown reaction; "blood doesn't clog too well" per mother.   . Keflex [Cephalexin]     Rash  . Nsaids Other (See Comments)    Unknown reaction  . Penicillins Hives and Other (See Comments)    Unknown reaction.  Pt has tolerated cephalosporins in the past.    Chief Complaint  Patient presents with  . Acute Visit    Lab follow up    HPI:  He is a 62 year old long term resident of this facility who has a long history of anemia. He is unable to participate in the hpi or ros. There are no reports of bloody stools; no indications of uncontrolled pain; no reports of constipation or diarrhea. He continues to be followed for his chronic illnesses including: dysphagia; gerd. His hgb is presently 9.8; with his previous at 11.0. His k+ level is slightly elevated at 5.3   Past Medical History:  Diagnosis Date  . Acute encephalopathy 01/02/2014  . Adrenal insufficiency ([place]) 11/08/2011  . Adrenocortical insufficiency ([place])   . Allergic rhinitis   . Anemia    Iron deficient  . Aphasia 06/19/2013  . Cataract   . Clostridium difficile colitis 02/2014  . Constipation   . Contracture of joint of left hand   . Depression   . Diabetic gastroparesis ([place]) 11/08/2011  . Dysphagia 06/19/2013  . Gastroesophageal reflux disease 03/17/2015  . GERD (gastroesophageal reflux disease)   . Glucocorticoid deficiency ([place]) 08/10/2012  . History of colon polyps 05/22/2015  . Ileus ([place]) 11/08/2011  . Impaired verbal communication   . Iron deficiency anemia 08/10/2012  . Lung nodule 05/22/2015  . Nontraumatic chronic subdural hemorrhage ([place])   . Parkinson disease ([place])   . Parkinson's disease ([place])   . PEG (percutaneous endoscopic gastrostomy) status ([place]) 10/04/2013  . Pneumonia 07/2014   aspiration pneumonia  . Pyelonephritis   .  Quadriplegia ([place])   . S/P percutaneous endoscopic gastrostomy (PEG) tube placement ([place])   . Seizures ( [place])    last occurance 2015  . SIRS (systemic inflammatory response syndrome) ([place]) 08/06/2014  . Subdural hematoma ([place]) 01/02/2014  . Type 2 diabetes mellitus without complication ([place]) 10/93/2355  . UTI (urinary tract infection) 07/2014  . Vitamin D deficiency 10/16/2016  . Wheelchair bound     Past Surgical History:  Procedure Laterality Date  . COLONOSCOPY    . ESOPHAGOGASTRODUODENOSCOPY    . PEG TUBE PLACEMENT    . PERIPHERALLY INSERTED CENTRAL CATHETER INSERTION    . TONSILLECTOMY  1962    Social History   Socioeconomic History  . Marital status: Single    Spouse name: Not on file  . Number of children: Not on file  . Years of education: Not on file  . Highest education level: Not on file  Occupational History  . Occupation: truck Animator Needs  . Financial resource strain: Not on file  . Food insecurity:    Worry: Not on file    Inability: Not on file  . Transportation needs:    Medical: Not on file    Non-medical: Not on file  Tobacco Use  . Smoking status: Never Smoker  . Smokeless tobacco: Never Used  Substance and Sexual Activity  . Alcohol use: No  . Drug use: No  .  Sexual activity: Not Currently  Lifestyle  . Physical activity:    Days per week: Not on file    Minutes per session: Not on file  . Stress: Not on file  Relationships  . Social connections:    Talks on phone: Not on file    Gets together: Not on file    Attends religious service: Not on file    Active member of club or organization: Not on file    Attends meetings of clubs or organizations: Not on file    Relationship status: Not on file  . Intimate partner violence:    Fear of current or ex partner: Not on file    Emotionally abused: Not on file    Physically abused: Not on file    Forced sexual activity: Not on file  Other Topics Concern  . Not on file  Social  History Narrative   Admitted to [facility] 06/08/2005   Never married   Never smoked   Alcohol none   Full code   Family History  Problem Relation Age of Onset  . Hypothyroidism Mother   . Hypertension Mother   . Diabetes Father   . Hypertension Father   . Diabetes Paternal Grandfather       VITAL SIGNS BP 120/75   Pulse 80   Temp 97.6 F (36.4 C)   Resp 18   Ht 5\' 9"  (1.753 m)   Wt 162 lb (73.5 kg)   BMI 23.92 kg/m   Outpatient Encounter Medications as of 10/26/2017  Medication Sig  . acetaminophen (TYLENOL) 325 MG tablet Place 650 mg into feeding tube every 6 (six) hours as needed. Fever > 101  . Baclofen 5 MG TABS Take by mouth. Give 1 tablet by Per Gtube route 4 times daily  . bisacodyl (DULCOLAX) 10 MG suppository Place 10 mg rectally daily as needed for moderate constipation. If constipation not relieved by milk of magnesia give one 10 mg suppository in 24hours  . Cholecalciferol 5000 UNIT/ML LIQD Take by mouth. 10 mls by Per G Tube route once a week. Wednesdays  . folic acid (FOLVITE) 1 MG tablet 1 mg by PEG Tube route daily.  . hydrocortisone (CORTEF) 20 MG tablet Take 20 mg by mouth. Give 1 tablet via per tube twice daily  . loratadine (CLARITIN) 10 MG tablet 10 mg by PEG Tube route daily.  . Magnesium 400 MG TABS Take by mouth. Give 1 tablet by Per Gtube twice daily for supplement  . magnesium hydroxide (MILK OF MAGNESIA) 400 MG/5ML suspension Take 30 mLs by mouth daily as needed for mild constipation.  . metoCLOPramide (REGLAN) 10 MG tablet Place 1 tablet (10 mg total) into feeding tube 4 (four) times daily -  before meals and at bedtime. Via Peg tube  . Multiple Vitamins-Minerals (CENTAMIN) LIQD 5 mLs by PEG Tube route daily.   . Nutritional Supplements (FEEDING SUPPLEMENT, JEVITY 1.5 CAL,) LIQD Place into feeding tube. Initiate Jevity 1.5 cal @@ 58 ml/hr continuous x 20 hr via peg ( hold 1 hr pre/post phenton administration)  . olopatadine (PATANOL) 0.1 %  ophthalmic solution Place 1 drop into both eyes every morning.  . pantoprazole sodium (PROTONIX) 40 mg/20 mL PACK Take by mouth 2 (two) times daily.  . phenytoin (DILANTIN) 125 MG/5ML suspension Place 100 mg into feeding tube 2 (two) times daily.  . polyethylene glycol (MIRALAX / GLYCOLAX) packet Place 17 g into feeding tube daily as needed.  . polyvinyl alcohol (LIQUIFILM TEARS)  1.4 % ophthalmic solution Place 1 drop into both eyes daily at 6 (six) AM.  . promethazine (PHENERGAN) 25 MG tablet Place 25 mg into feeding tube every 6 (six) hours as needed for nausea or vomiting.  . simethicone (MYLICON) 40 MV/7.8IO drops Take 40 mg by mouth. Give 1.2 mls ( 80 mg total ) by G-tube twice daily  . Soap & Cleansers (CETAPHIL GENTLE CLEANSER) LIQD Apply to face and neck with water and then pat dry daily.  . Sodium Phosphates (RA SALINE ENEMA) 19-7 GM/118ML ENEM Place 1 each rectally as needed (for constipation).  . Water For Irrigation, Sterile (STERILE WATER FOR IRRIGATION) Flush 265ml of water into peg tub every 4 hours  . [DISCONTINUED] baclofen (LIORESAL) 10 MG tablet Place 1 tablet (10 mg total) into feeding tube 3 (three) times daily.  . [DISCONTINUED] ferrous sulfate 220 (44 FE) MG/5ML solution Place 330 mg into feeding tube See admin instructions. Give 7.60ml  Per tube every morning  . [DISCONTINUED] magnesium oxide (MAG-OX) 400 MG tablet Take 400 mg by mouth 2 (two) times daily.  . [DISCONTINUED] prednisoLONE (PRELONE) 15 MG/5ML SOLN Place into feeding tube. Give 6 mg every morning and 4 mg every evening at 6 pm   No facility-administered encounter medications on file as of 10/26/2017.      SIGNIFICANT DIAGNOSTIC EXAMS  LABS REVIEWED: TODAY:   09-18-17: wbc 7.5; hgb 11.0 hct 34; plt 298; glucose 118; bun 12; creat 049; k+ 4.0; na++135 10-25-17: wbc 9.7; hgb 9.8; hct 30.3; mcv 71.7; plt 331; glucose 113' un 11.0; creat 0.51 ;k+ 5.3; na++ 137; ca 8.4  Review of Systems  Unable to perform ROS:  Other (nonverbal)    Physical Exam  Constitutional: He appears well-developed and well-nourished. No distress.  Neck: No thyromegaly present.  Cardiovascular: Normal rate, regular rhythm, normal heart sounds and intact distal pulses.  Pulmonary/Chest: Effort normal and breath sounds normal. No respiratory distress.  Abdominal: Soft. Bowel sounds are normal. He exhibits no distension. There is no tenderness.  Peg tube present without signs of infection present   Musculoskeletal: He exhibits no edema.  Has limited movement of extremities   Lymphadenopathy:    He has no cervical adenopathy.  Neurological: He is alert.  Skin: Skin is warm and dry. He is not diaphoretic.  Psychiatric: He has a normal mood and affect.      ASSESSMENT/ PLAN:  TODAY:   1. Iron deficiency anemia secondary to inadequate dietary intake: his status is worse; will get iron studies and vit b 12 studies; and guaiac stool    2. Dysphagia: no signs of aspiration present; will continue tube feeding.   3. GERD: is stable will continue reglan 10 mg four times daily   4. Hyperkalemia: k+ 5.3 will repeat k+ level and will monitor     MD is aware of resident's narcotic use and is in agreement with current plan of care. We will attempt to wean resident as apropriate   Ok Edwards NP [facility]  Contact [phone] Monday through Friday 8am- 5pm  After hours call [phone]

## 2017-10-29 ENCOUNTER — Encounter: Payer: Self-pay | Admitting: Internal Medicine

## 2017-10-29 DIAGNOSIS — E8770 Fluid overload, unspecified: Secondary | ICD-10-CM | POA: Insufficient documentation

## 2017-10-29 DIAGNOSIS — K2210 Ulcer of esophagus without bleeding: Secondary | ICD-10-CM | POA: Insufficient documentation

## 2017-10-29 DIAGNOSIS — R063 Periodic breathing: Secondary | ICD-10-CM | POA: Insufficient documentation

## 2017-10-29 DIAGNOSIS — K922 Gastrointestinal hemorrhage, unspecified: Secondary | ICD-10-CM | POA: Insufficient documentation

## 2017-10-29 DIAGNOSIS — M4856XS Collapsed vertebra, not elsewhere classified, lumbar region, sequela of fracture: Secondary | ICD-10-CM | POA: Insufficient documentation

## 2017-10-29 DIAGNOSIS — B965 Pseudomonas (aeruginosa) (mallei) (pseudomallei) as the cause of diseases classified elsewhere: Secondary | ICD-10-CM | POA: Insufficient documentation

## 2017-10-29 DIAGNOSIS — K920 Hematemesis: Secondary | ICD-10-CM | POA: Insufficient documentation

## 2017-10-29 DIAGNOSIS — N390 Urinary tract infection, site not specified: Secondary | ICD-10-CM

## 2017-10-30 ENCOUNTER — Encounter: Payer: Self-pay | Admitting: Internal Medicine

## 2017-10-30 ENCOUNTER — Non-Acute Institutional Stay (SKILLED_NURSING_FACILITY): Payer: Medicare Other | Admitting: Internal Medicine

## 2017-10-30 DIAGNOSIS — D508 Other iron deficiency anemias: Secondary | ICD-10-CM

## 2017-10-30 DIAGNOSIS — R569 Unspecified convulsions: Secondary | ICD-10-CM

## 2017-10-30 DIAGNOSIS — E2740 Unspecified adrenocortical insufficiency: Secondary | ICD-10-CM | POA: Diagnosis not present

## 2017-10-30 DIAGNOSIS — E1143 Type 2 diabetes mellitus with diabetic autonomic (poly)neuropathy: Secondary | ICD-10-CM

## 2017-10-30 DIAGNOSIS — Z8659 Personal history of other mental and behavioral disorders: Secondary | ICD-10-CM | POA: Diagnosis not present

## 2017-10-30 DIAGNOSIS — I10 Essential (primary) hypertension: Secondary | ICD-10-CM | POA: Diagnosis not present

## 2017-10-30 DIAGNOSIS — D559 Anemia due to enzyme disorder, unspecified: Secondary | ICD-10-CM | POA: Diagnosis not present

## 2017-10-30 DIAGNOSIS — D649 Anemia, unspecified: Secondary | ICD-10-CM | POA: Diagnosis not present

## 2017-10-30 DIAGNOSIS — K219 Gastro-esophageal reflux disease without esophagitis: Secondary | ICD-10-CM

## 2017-10-30 DIAGNOSIS — K3184 Gastroparesis: Secondary | ICD-10-CM | POA: Diagnosis not present

## 2017-10-30 LAB — BASIC METABOLIC PANEL: Potassium: 4.3 (ref 3.4–5.3)

## 2017-10-30 NOTE — Progress Notes (Signed)
Opened in error

## 2017-11-05 ENCOUNTER — Encounter: Payer: Self-pay | Admitting: Internal Medicine

## 2017-11-05 DIAGNOSIS — Z8659 Personal history of other mental and behavioral disorders: Secondary | ICD-10-CM | POA: Insufficient documentation

## 2017-11-05 NOTE — Progress Notes (Signed)
: Provider:  [name] MD Location:   [facility]   Place of Service:   Skilled nursing facility  PCP: [name], MD Patient Care Team: [name], MD as PCP - General (Internal Medicine)  Extended Emergency Contact Information Primary Emergency Contact: Bells,[name] Address: [address]          [place], [place] [address] Johnnette Litter of [place] Phone: [phone] Mobile Phone: [phone] Relation: Mother Secondary Emergency Contact: Constance Haw [place] of [place] Phone: [phone] Relation: Brother Father: Harjit, [place] of [place] Mobile Phone: [phone]     Allergies: Aspirin; Keflex [cephalexin]; Nsaids; and Penicillins  Chief Complaint  Patient presents with  . Readmit To SNF    HPI: Patient is 62 y.o. male who was discharged from [facility] where he was treated from 6/29-7/24 for multiple medical problems including hematemesis due to severe erosive esophagitis, Pseudomonas UTI, hypoxic respiratory failure, and severe anemia due to blood loss.  Patient also had low back pain due to multiple compression fractures and a PEG tube exchange.  In addition he was treated for left pyelonephritis and Compcare UTI due to Proteus mirabilis a JJ stent was placed by urology for a large left ureteral stone.  Patient also had aspiration pneumonia treated with IV ertapenem.  Patient was discharged back to skilled nursing facility where his mother found him to be with altered mental status and he was brought back to [facility].  Patient was admitted to [facility] from 7/20 4-26 where he improved after being given stress doses of hydrocortisone for his history of adrenal insufficiency.  Also patient was started on amantadine 100 mg per G-tube twice a day as this medication had efficacy and severe cognitive impairment.  Patient also has anemia of chronic disease and was  treated with IV iron.  And his dose of baclofen was decreased from a total dose of 30 mg daily to 20 mg daily to further improve his mental status all these things being done patient was stable to be discharged back to his skilled nursing facility.  While at skilled nursing facility patient will be followed for diabetic gastroparesis treated with Reglan, if esophagitis treated with Protonix and seizures treated with Dilantin.  Past Medical History:  Diagnosis Date  . Acute encephalopathy 01/02/2014  . Adrenal insufficiency ([place]) 11/08/2011  . Adrenocortical insufficiency ([place])   . Allergic rhinitis   . Anemia    Iron deficient  . Aphasia 06/19/2013  . Cataract   . Clostridium difficile colitis 02/2014  . Constipation   . Contracture of joint of left hand   . Depression   . Diabetic gastroparesis ([place]) 11/08/2011  . Dysphagia 06/19/2013  . Gastroesophageal reflux disease 03/17/2015  . GERD (gastroesophageal reflux disease)   . Glucocorticoid deficiency ([place]) 08/10/2012  . History of colon polyps 05/22/2015  . Ileus ([place]) 11/08/2011  . Impaired verbal communication   . Iron deficiency anemia 08/10/2012  . Lung nodule 05/22/2015  . Nontraumatic chronic subdural hemorrhage ([place])   . Parkinson disease ([place])   . Parkinson's disease ([place])   . PEG (percutaneous endoscopic gastrostomy) status ([place]) 10/04/2013  . Pneumonia 07/2014   aspiration pneumonia  . Pyelonephritis   . Quadriplegia ([place])   . S/P percutaneous endoscopic gastrostomy (PEG) tube placement ([place])   . Seizures ([place])    last occurance 2015  . SIRS (systemic inflammatory response syndrome) ([place]) 08/06/2014  . Subdural  hematoma ([place]) 01/02/2014  . Type 2 diabetes mellitus without complication ([place]) 21/30/8657  . UTI (urinary tract infection) 07/2014  . Vitamin D deficiency 10/16/2016  . Wheelchair bound     Past Surgical History:  Procedure Laterality Date  . COLONOSCOPY    . ESOPHAGOGASTRODUODENOSCOPY    . PEG TUBE PLACEMENT     . PERIPHERALLY INSERTED CENTRAL CATHETER INSERTION    . TONSILLECTOMY  1962    Allergies as of 10/30/2017      Reactions   Aspirin Other (See Comments)   Unknown reaction; "blood doesn't clog too well" per mother.    Keflex [cephalexin]    Rash   Nsaids Other (See Comments)   Unknown reaction   Penicillins Hives, Other (See Comments)   Unknown reaction.  Pt has tolerated cephalosporins in the past.      Medication List        Accurate as of 10/30/17 11:59 PM. Always use your most recent med list.          acetaminophen 325 MG tablet Commonly known as:  TYLENOL Place 650 mg into feeding tube every 6 (six) hours as needed. Fever > 101   Baclofen 5 MG Tabs Take by mouth. Give 1 tablet by Per Gtube route 4 times daily   bisacodyl 10 MG suppository Commonly known as:  DULCOLAX Place 10 mg rectally daily as needed for moderate constipation. If constipation not relieved by milk of magnesia give one 10 mg suppository in 24hours   CENTAMIN Liqd 5 mLs by PEG Tube route daily.   CETAPHIL GENTLE CLEANSER Liqd Apply to face and neck with water and then pat dry daily.   Cholecalciferol 5000 UNIT/ML Liqd Take by mouth. 10 mls by Per G Tube route once a week. Wednesdays   DILANTIN 125 MG/5ML suspension Generic drug:  phenytoin Place 100 mg into feeding tube 2 (two) times daily.   feeding supplement (JEVITY 1.5 CAL) Liqd Place into feeding tube. Initiate Jevity 1.5 cal @@ 58 ml/hr continuous x 20 hr via peg ( hold 1 hr pre/post phenton administration)   folic acid 1 MG tablet Commonly known as:  FOLVITE 1 mg by PEG Tube route daily.   hydrocortisone 20 MG tablet Commonly known as:  CORTEF Take 20 mg by mouth. Give 1 tablet via per tube twice daily   loratadine 10 MG tablet Commonly known as:  CLARITIN 10 mg by PEG Tube route daily.   Magnesium 400 MG Tabs Take by mouth. Give 1 tablet by Per Gtube twice daily for supplement   magnesium hydroxide 400 MG/5ML  suspension Commonly known as:  MILK OF MAGNESIA Take 30 mLs by mouth daily as needed for mild constipation.   metoCLOPramide 10 MG tablet Commonly known as:  REGLAN Place 1 tablet (10 mg total) into feeding tube 4 (four) times daily -  before meals and at bedtime. Via Peg tube   olopatadine 0.1 % ophthalmic solution Commonly known as:  PATANOL Place 1 drop into both eyes every morning.   pantoprazole sodium 40 mg/20 mL Pack Commonly known as:  PROTONIX Take by mouth 2 (two) times daily.   polyethylene glycol packet Commonly known as:  MIRALAX / GLYCOLAX Place 17 g into feeding tube daily as needed.   polyvinyl alcohol 1.4 % ophthalmic solution Commonly known as:  LIQUIFILM TEARS Place 1 drop into both eyes daily at 6 (six) AM.   promethazine 25 MG tablet Commonly known as:  PHENERGAN Place 25 mg into feeding tube  every 6 (six) hours as needed for nausea or vomiting.   RA SALINE ENEMA 19-7 GM/118ML Enem Place 1 each rectally as needed (for constipation).   simethicone 40 MG/0.6ML drops Commonly known as:  MYLICON Take 40 mg by mouth. Give 1.2 mls ( 80 mg total ) by G-tube twice daily   sterile water for irrigation Flush 264ml of water into peg tub every 4 hours       No orders of the defined types were placed in this encounter.   Immunization History  Administered Date(s) Administered  . Influenza Whole 12/31/2012  . Influenza-Unspecified 12/29/2011, 12/31/2012, 12/22/2014, 01/22/2016, 01/05/2017  . PPD Test 08/22/2008  . Pneumococcal-Unspecified 06/23/2010, 06/23/2015    Social History   Tobacco Use  . Smoking status: Never Smoker  . Smokeless tobacco: Never Used  Substance Use Topics  . Alcohol use: No    Family history is   Family History  Problem Relation Age of Onset  . Hypothyroidism Mother   . Hypertension Mother   . Diabetes Father   . Hypertension Father   . Diabetes Paternal Grandfather       Review of Systems  DATA OBTAINED: from  nurse GENERAL:  no fevers, fatigue, appetite changes SKIN: No itching, or rash EYES: No eye pain, redness, discharge EARS: No earache, tinnitus, change in hearing NOSE: No congestion, drainage or bleeding  MOUTH/THROAT: No mouth or tooth pain, No sore throat RESPIRATORY: No cough, wheezing, SOB CARDIAC: No chest pain, palpitations, lower extremity edema  GI: No abdominal pain, No N/V/D or constipation, No heartburn or reflux  GU: No dysuria, frequency or urgency, or incontinence  MUSCULOSKELETAL: No unrelieved bone/joint pain NEUROLOGIC: No headache, dizziness or focal weakness PSYCHIATRIC: No c/o anxiety or sadness   Vitals:   11/05/17 1926  BP: 128/78  Pulse: 80  Resp: 20  Temp: 97.8 F (36.6 C)    SpO2 Readings from Last 1 Encounters:  09/22/17 92%   There is no height or weight on file to calculate BMI.     Physical Exam  GENERAL APPEARANCE: Alert, non-conversant,  No acute distress, patient looks off 100 times better than he did 3 days ago, smiling eyes sparkling energy.  SKIN: No diaphoresis rash HEAD: Normocephalic, atraumatic  EYES: Conjunctiva/lids clear. Pupils round, reactive. EOMs intact.  EARS: External exam WNL, canals clear. Hearing grossly normal.  NOSE: No deformity or discharge.  MOUTH/THROAT: Lips w/o lesions  RESPIRATORY: Breathing is even, unlabored. Lung sounds are clear   CARDIOVASCULAR: Heart RRR no murmurs, rubs or gallops. No peripheral edema.   GASTROINTESTINAL: Abdomen is soft, non-tender, not distended w/ normal bowel sounds.  Me: PEG tube GENITOURINARY: Bladder non tender, not distended  MUSCULOSKELETAL: No abnormal joints or musculature NEUROLOGIC: Patient with quadriplegia, dysphasia and unable to speak; can communicate with gestures board does have some movement of upper extremities PSYCHIATRIC: Mood and affect appropriate to situation, no behavioral issues  Patient Active Problem List   Diagnosis Date Noted  . Hematemesis 10/29/2017   . Upper GI bleed 10/29/2017  . Erosive esophagitis 10/29/2017  . Pseudomonas urinary tract infection 10/29/2017  . Volume overload 10/29/2017  . Compression fracture of lumbar spine, non-traumatic, sequela 10/29/2017  . Cheyne-Stokes breathing 10/29/2017  . Hyperkalemia 10/26/2017  . Urinary tract infection due to Proteus 09/21/2017  . Infection of PEG site ([place]) 09/21/2017  . Hydronephrosis with renal and ureteral calculus obstruction 09/21/2017  . Left ureteral calculus 09/21/2017  . Flexion contractures 09/21/2017  . Vitamin D deficiency 10/16/2016  .  Anemia   . GERD (gastroesophageal reflux disease)   . Clostridium difficile colitis   . Adrenocortical insufficiency ([place])   . Cataract 06/13/2016  . Constipation 06/13/2016  . Contracture of joint of left hand 06/13/2016  . Impaired verbal communication 06/13/2016  . Nontraumatic chronic subdural hemorrhage ([place]) 06/13/2016  . Nonverbal 06/13/2016  . Wheelchair bound 06/13/2016  . Allergic rhinitis 06/13/2016  . S/P percutaneous endoscopic gastrostomy (PEG) tube placement ([place]) 06/13/2016  . Itchy eyes 11/12/2015  . Benign neoplasm of colon 05/22/2015  . History of colon polyps 05/22/2015  . Lung nodule 05/22/2015  . Abnormal levels of other serum enzymes 05/22/2015  . Gastroesophageal reflux disease 03/17/2015  . Tinea cruris 09/10/2014  . Aspiration pneumonia ([place]) 08/15/2014  . Abscess, prostate 08/15/2014  . Pyelonephritis 08/06/2014  . Nausea & vomiting   . Fever presenting with conditions classified elsewhere 03/16/2014  . Altered mental status 03/16/2014  . Acute bronchiolitis due to unspecified organism 03/11/2014  . Enteritis due to Clostridium difficile 03/11/2014  . Type 2 diabetes mellitus without complication ([place]) 28/78/6767  . Septic shock ([place]) 01/26/2014  . Sepsis ([place]) 01/25/2014  . Acute encephalopathy 01/02/2014  . Acute respiratory failure with hypercapnia ([place]) 01/02/2014  . Chronic anemia  01/02/2014  . Subdural hematoma ([place]) 01/02/2014  . Pain in thoracic spine 12/26/2013  . PEG (percutaneous endoscopic gastrostomy) status ([place]) 10/04/2013  . Rash and nonspecific skin eruption 07/08/2013  . Thrush 07/08/2013  . Heme positive stool 07/07/2013  . Severe sepsis ([place]) 06/23/2013  . E. coli pyelonephritis 06/22/2013  . SIRS (systemic inflammatory response syndrome) ([place]) 06/19/2013  . Leukocytosis 06/19/2013  . Bronchitis 06/19/2013  . Dysphagia 06/19/2013  . Aphasia 06/19/2013  . Other convulsions 08/10/2012  . Iron deficiency anemia 08/10/2012  . Glucocorticoid deficiency ([place]) 08/10/2012  . Febrile illness, acute 06/15/2012  . UTI (lower urinary tract infection) 06/15/2012  . Hypokalemia 11/10/2011  . Diabetic gastroparesis ([place]) 11/08/2011  . Ileus ([place]) 11/08/2011  . Hyponatremia 11/08/2011  . Adrenal insufficiency ([place]) 11/08/2011  . Quadriplegia ([place]) 11/08/2011  . Parkinson disease ([place])   . Seizures ([place])   . Depression       Labs reviewed: Basic Metabolic Panel:    Component Value Date/Time   NA 137 10/25/2017   NA 135 09/18/2017   K 4.3 10/30/2017   K 4.0 09/18/2017   CL 108 06/26/2016 1424   CO2 26 06/26/2016 1424   GLUCOSE 107 (H) 06/26/2016 1424   BUN 11 10/25/2017   CREATININE 0.5 (A) 10/25/2017   CREATININE 0.49 09/18/2017   CALCIUM 8.6 09/18/2017   PROT 8.3 (H) 06/26/2016 1424   ALBUMIN 3.6 06/26/2016 1424   AST 22 05/26/2017   ALT 15 05/26/2017   ALKPHOS 106 05/26/2017   BILITOT 0.5 06/26/2016 1424   GFRNONAA >60 06/26/2016 1424   GFRAA >90 09/18/2017    Recent Labs    06/05/17 09/18/17 10/25/17 10/30/17  NA 138 135 137  --   K 3.9 4.0 5.3 4.3  BUN 16 12 11   --   CREATININE 0.5* 0.49 0.5*  --   CALCIUM  --  8.6  --   --    Liver Function Tests: Recent Labs    05/26/17  AST 22  ALT 15  ALKPHOS 106   No results for input(s): LIPASE, AMYLASE in the last 8760 hours. No results for input(s): AMMONIA in the last 8760  hours. CBC: Recent Labs    06/05/17 09/18/17 10/25/17  WBC 7.4 7.5  9.7  HGB 11.6* 11.0* 9.8*  HCT 38* 34* 30*  PLT 200 298 331   Lipid No results for input(s): CHOL, HDL, LDLCALC, TRIG in the last 8760 hours.  Cardiac Enzymes: No results for input(s): CKTOTAL, CKMB, CKMBINDEX, TROPONINI in the last 8760 hours. BNP: No results for input(s): BNP in the last 8760 hours. Lab Results  Component Value Date   MICROALBUR 10.4 12/01/2016   Lab Results  Component Value Date   HGBA1C 5.0 03/24/2017   Lab Results  Component Value Date   TSH 1.78 09/13/2016   Lab Results  Component Value Date   GYKZLDJT70 177 01/29/2014   No results found for: FOLATE Lab Results  Component Value Date   IRON 48 01/29/2014   TIBC 125 (L) 01/29/2014    Imaging and Procedures obtained prior to SNF admission: Dg Abd Acute W/chest  Result Date: 06/26/2016 CLINICAL DATA:  Nausea and vomiting. Parkinson disease. Gastrostomy tube feeding. Quadriplegia. EXAM: DG ABDOMEN ACUTE W/ 1V CHEST COMPARISON:  01/11/2016 chest radiograph. 11/26/2014 CT abdomen/pelvis. FINDINGS: Stable cardiomediastinal silhouette with normal heart size and aortic atherosclerosis. No pneumothorax. No pleural effusion. Slightly low lung volumes. No pulmonary edema. No acute consolidative airspace disease. Percutaneous gastrostomy tube is seen terminating in the left upper quadrant of the abdomen. No disproportionately dilated small bowel loops or significant air-fluid levels . Moderate colorectal stool volume. No evidence of pneumatosis or pneumoperitoneum. No radiopaque urolithiasis. IMPRESSION: 1. No active disease in the chest . 2. Percutaneous gastrostomy tube terminates in the left upper quadrant of the abdomen. No evidence of free intraperitoneal air. 3. Nonobstructive bowel gas pattern . 4. Moderate colorectal stool volume. Electronically Signed   By: Ilona Sorrel M.D.   On: 06/26/2016 14:57     Not all labs, radiology exams or  other studies done during hospitalization come through on my EPIC note; however they are reviewed by me.    Assessment and Plan  Mental status change/acute adrenal insufficiency/TBI- patient receive stress doses of hydrocortisone with improvement; amantadine 100 mg per tube twice daily was started as this is shown to have efficacy and severe cognitive impairment SNF -patient admitted for residential care; continue Cortef 20 mg per tube twice daily, and amantadine 100 mg twice daily to improve cognitive dysfunction  Anemia of chronic disease/iron deficiency anemia patient received 5 500 mg of IV iron SNF -follow-up CBC  Diabetic gastroparesis SNF -continue Reglan 10 mg per tube 4 times daily  Seizure disorder SNF -continue Dilantin 100 mg per tube twice daily  GERD with esophagitis SNF -continue Protonix 40 milligrams per tube twice daily   Time spent greater than 35 minutes;> 50% of time with patient was spent reviewing records, labs, tests and studies, counseling and developing plan of care  Inocencio Homes, MD

## 2017-11-05 NOTE — Progress Notes (Signed)
This encounter was created in error - please disregard.

## 2017-11-19 DIAGNOSIS — R1110 Vomiting, unspecified: Secondary | ICD-10-CM | POA: Diagnosis not present

## 2017-11-20 ENCOUNTER — Encounter: Payer: Self-pay | Admitting: Internal Medicine

## 2017-11-20 ENCOUNTER — Non-Acute Institutional Stay (SKILLED_NURSING_FACILITY): Payer: Medicare Other | Admitting: Internal Medicine

## 2017-11-20 DIAGNOSIS — J189 Pneumonia, unspecified organism: Secondary | ICD-10-CM | POA: Diagnosis not present

## 2017-11-20 NOTE — Progress Notes (Signed)
Location:  [facility] Room Number: 214D Place of Service:  SNF [phone])  [name]. [name] Coil, MD  Patient Care Team: [name] Duos, MD as PCP - General (Internal Medicine)  Extended Emergency Contact Information Primary Emergency Contact: NathanChase Address: [address]          [place], [place] [address] [name] Phone: [phone] Mobile Phone: [phone] Relation: Mother Secondary Emergency Contact: [name] [place] Phone: [phone] Relation: Brother Father: [name] [place] of [place] Mobile Phone: [phone]    Allergies: Aspirin; Keflex [cephalexin]; Nsaids; and Penicillins  Chief Complaint  Patient presents with  . Medical Management of Chronic Issues    Routine Visit  . Health Maintenance    Hmg A1C    HPI: Patient is 63 y.o. male who   Past Medical History:  Diagnosis Date  . Acute encephalopathy 01/02/2014  . Adrenal insufficiency ([place]) 11/08/2011  . Adrenocortical insufficiency ([place])   . Allergic rhinitis   . Anemia    Iron deficient  . Aphasia 06/19/2013  . Cataract   . Clostridium difficile colitis 02/2014  . Constipation   . Contracture of joint of left hand   . Depression   . Diabetic gastroparesis ([place]) 11/08/2011  . Dysphagia 06/19/2013  . Gastroesophageal reflux disease 03/17/2015  . GERD (gastroesophageal reflux disease)   . Glucocorticoid deficiency ([place]) 08/10/2012  . History of colon polyps 05/22/2015  . Ileus ([place]) 11/08/2011  . Impaired verbal communication   . Iron deficiency anemia 08/10/2012  . Lung nodule 05/22/2015  . Nontraumatic chronic subdural hemorrhage ([place])   . Parkinson disease ([place])   . Parkinson's disease ([place])   . PEG (percutaneous endoscopic gastrostomy) status ([place]) 10/04/2013  . Pneumonia 07/2014   aspiration pneumonia  . Pyelonephritis   . Quadriplegia ([place])   . S/P percutaneous endoscopic gastrostomy (PEG)  tube placement ([place])   . Seizures ([place])    last occurance 2015  . SIRS (systemic inflammatory response syndrome) ([place]) 08/06/2014  . Subdural hematoma ([place]) 01/02/2014  . Type 2 diabetes mellitus without complication ([place]) 37/90/2409  . UTI (urinary tract infection) 07/2014  . Vitamin D deficiency 10/16/2016  . Wheelchair bound     Past Surgical History:  Procedure Laterality Date  . COLONOSCOPY    . ESOPHAGOGASTRODUODENOSCOPY    . PEG TUBE PLACEMENT    . PERIPHERALLY INSERTED CENTRAL CATHETER INSERTION    . TONSILLECTOMY  1962    Allergies as of 11/20/2017      Reactions   Aspirin Other (See Comments)   Unknown reaction; "blood doesn't clog too well" per mother.    Keflex [cephalexin]    Rash   Nsaids Other (See Comments)   Unknown reaction   Penicillins Hives, Other (See Comments)   Unknown reaction.  Pt has tolerated cephalosporins in the past.      Medication List        Accurate as of 11/20/17  3:43 PM. Always use your most recent med list.          acetaminophen 325 MG tablet Commonly known as:  TYLENOL Place 650 mg into feeding tube every 6 (six) hours as needed. Fever > 101   amantadine 50 MG/5ML solution Commonly known as:  SYMMETREL Take 100 mg by mouth. GIVE 10 CC (100 MG) BY PEG BID   Baclofen 5 MG Tabs Take by mouth. Give 1 tablet by Per Gtube route 4 times daily  bisacodyl 10 MG suppository Commonly known as:  DULCOLAX Place 10 mg rectally daily as needed for moderate constipation. If constipation not relieved by milk of magnesia give one 10 mg suppository in 24hours   CENTAMIN Liqd 5 mLs by PEG Tube route daily.   CETAPHIL GENTLE CLEANSER Liqd Apply to face and neck with water and then pat dry daily.   Cholecalciferol 5000 UNIT/ML Liqd Take by mouth. 10 mls by Per G Tube route once a week. Wednesdays   DILANTIN 125 MG/5ML suspension Generic drug:  phenytoin Place 100 mg into feeding tube 2 (two) times daily.   feeding supplement (JEVITY  1.5 CAL) Liqd Place into feeding tube. Initiate Jevity 1.5 cal @@ 58 ml/hr continuous x 20 hr via peg ( hold 1 hr pre/post phenton administration)   ferrous sulfate 220 (44 Fe) MG/5ML solution Take 220 mg by mouth. 7.5 ML VIA TUBE ONCE DAILY FOR ANEMIA   folic acid 1 MG tablet Commonly known as:  FOLVITE 1 mg by PEG Tube route daily.   hydrocortisone 20 MG tablet Commonly known as:  CORTEF Take 20 mg by mouth. Give 1 tablet via per tube twice daily   loratadine 10 MG tablet Commonly known as:  CLARITIN 10 mg by PEG Tube route daily.   Magnesium 400 MG Tabs Take by mouth. Give 1 tablet by Per Gtube twice daily for supplement   magnesium hydroxide 400 MG/5ML suspension Commonly known as:  MILK OF MAGNESIA Take 30 mLs by mouth daily as needed for mild constipation.   metoCLOPramide 10 MG tablet Commonly known as:  REGLAN Place 1 tablet (10 mg total) into feeding tube 4 (four) times daily -  before meals and at bedtime. Via Peg tube   olopatadine 0.1 % ophthalmic solution Commonly known as:  PATANOL Place 1 drop into both eyes every morning.   pantoprazole sodium 40 mg/20 mL Pack Commonly known as:  PROTONIX Take by mouth 2 (two) times daily.   polyethylene glycol packet Commonly known as:  MIRALAX / GLYCOLAX Place 17 g into feeding tube daily as needed.   polyvinyl alcohol 1.4 % ophthalmic solution Commonly known as:  LIQUIFILM TEARS Place 1 drop into both eyes daily at 6 (six) AM.   promethazine 25 MG tablet Commonly known as:  PHENERGAN Place 25 mg into feeding tube every 6 (six) hours as needed for nausea or vomiting.   RA SALINE ENEMA 19-7 GM/118ML Enem Place 1 each rectally as needed (for constipation).   simethicone 40 MG/0.6ML drops Commonly known as:  MYLICON Take 40 mg by mouth. Give 1.2 mls ( 80 mg total ) by G-tube twice daily   sterile water for irrigation Flush 273ml of water into peg tub every 4 hours       No orders of the defined types were  placed in this encounter.   Immunization History  Administered Date(s) Administered  . Influenza Whole 12/31/2012  . Influenza-Unspecified 12/29/2011, 12/31/2012, 12/22/2014, 01/22/2016, 01/05/2017  . PPD Test 08/22/2008  . Pneumococcal-Unspecified 06/23/2010, 06/23/2015    Social History   Tobacco Use  . Smoking status: Never Smoker  . Smokeless tobacco: Never Used  Substance Use Topics  . Alcohol use: No    Review of Systems  DATA OBTAINED: from patient, nurse, medical record, family member GENERAL:  no fevers, fatigue, appetite changes SKIN: No itching, rash HEENT: No complaint RESPIRATORY: No cough, wheezing, SOB CARDIAC: No chest pain, palpitations, lower extremity edema  GI: No abdominal pain, No N/V/D or  constipation, No heartburn or reflux  GU: No dysuria, frequency or urgency, or incontinence  MUSCULOSKELETAL: No unrelieved bone/joint pain NEUROLOGIC: No headache, dizziness  PSYCHIATRIC: No overt anxiety or sadness  Vitals:   11/20/17 1535  BP: 120/71  Pulse: 77  Resp: (!) 22  Temp: (!) 97.3 F (36.3 C)   Body mass index is 24.19 kg/m. Physical Exam  GENERAL APPEARANCE: Alert, conversant, No acute distress  SKIN: No diaphoresis rash HEENT: Unremarkable RESPIRATORY: Breathing is even, unlabored. Lung sounds are clear   CARDIOVASCULAR: Heart RRR no murmurs, rubs or gallops. No peripheral edema  GASTROINTESTINAL: Abdomen is soft, non-tender, not distended w/ normal bowel sounds.  GENITOURINARY: Bladder non tender, not distended  MUSCULOSKELETAL: No abnormal joints or musculature NEUROLOGIC: Cranial nerves 2-12 grossly intact. Moves all extremities PSYCHIATRIC: Mood and affect appropriate to situation, no behavioral issues  Patient Active Problem List   Diagnosis Date Noted  . Mental status change resolved 11/05/2017  . Hematemesis 10/29/2017  . Upper GI bleed 10/29/2017  . Erosive esophagitis 10/29/2017  . Pseudomonas urinary tract infection  10/29/2017  . Volume overload 10/29/2017  . Compression fracture of lumbar spine, non-traumatic, sequela 10/29/2017  . Cheyne-Stokes breathing 10/29/2017  . Hyperkalemia 10/26/2017  . Urinary tract infection due to Proteus 09/21/2017  . Infection of PEG site ([place]) 09/21/2017  . Hydronephrosis with renal and ureteral calculus obstruction 09/21/2017  . Left ureteral calculus 09/21/2017  . Flexion contractures 09/21/2017  . Vitamin D deficiency 10/16/2016  . Anemia of chronic disease   . GERD (gastroesophageal reflux disease)   . Clostridium difficile colitis   . Adrenocortical insufficiency ([place])   . Cataract 06/13/2016  . Constipation 06/13/2016  . Contracture of joint of left hand 06/13/2016  . Impaired verbal communication 06/13/2016  . Nontraumatic chronic subdural hemorrhage ([place]) 06/13/2016  . Nonverbal 06/13/2016  . Wheelchair bound 06/13/2016  . Allergic rhinitis 06/13/2016  . S/P percutaneous endoscopic gastrostomy (PEG) tube placement ([place]) 06/13/2016  . Itchy eyes 11/12/2015  . Benign neoplasm of colon 05/22/2015  . History of colon polyps 05/22/2015  . Lung nodule 05/22/2015  . Abnormal levels of other serum enzymes 05/22/2015  . Gastroesophageal reflux disease 03/17/2015  . Tinea cruris 09/10/2014  . Aspiration pneumonia ([place]) 08/15/2014  . Abscess, prostate 08/15/2014  . Pyelonephritis 08/06/2014  . Nausea & vomiting   . Fever presenting with conditions classified elsewhere 03/16/2014  . Altered mental status 03/16/2014  . Acute bronchiolitis due to unspecified organism 03/11/2014  . Enteritis due to Clostridium difficile 03/11/2014  . Type 2 diabetes mellitus without complication ([place]) 26/71/2458  . Septic shock ([place]) 01/26/2014  . Sepsis ([place]) 01/25/2014  . Acute encephalopathy 01/02/2014  . Acute respiratory failure with hypercapnia ([place]) 01/02/2014  . Chronic anemia 01/02/2014  . Subdural hematoma ([place]) 01/02/2014  . Pain in thoracic spine 12/26/2013  .  PEG (percutaneous endoscopic gastrostomy) status ([place]) 10/04/2013  . Rash and nonspecific skin eruption 07/08/2013  . Thrush 07/08/2013  . Heme positive stool 07/07/2013  . Severe sepsis ([place]) 06/23/2013  . E. coli pyelonephritis 06/22/2013  . SIRS (systemic inflammatory response syndrome) ([place]) 06/19/2013  . Leukocytosis 06/19/2013  . Bronchitis 06/19/2013  . Dysphagia 06/19/2013  . Aphasia 06/19/2013  . Other convulsions 08/10/2012  . Iron deficiency anemia 08/10/2012  . Glucocorticoid deficiency ([place]) 08/10/2012  . Febrile illness, acute 06/15/2012  . UTI (lower urinary tract infection) 06/15/2012  . Hypokalemia 11/10/2011  . Diabetic gastroparesis ([place]) 11/08/2011  . Ileus ([place]) 11/08/2011  . Hyponatremia  11/08/2011  . Adrenal insufficiency ([place]) 11/08/2011  . Quadriplegia ([place]) 11/08/2011  . Parkinson disease ([place])   . Seizures ([place])   . Depression     CMP     Component Value Date/Time   NA 137 10/25/2017   NA 135 09/18/2017   K 4.3 10/30/2017   K 4.0 09/18/2017   CL 108 06/26/2016 1424   CO2 26 06/26/2016 1424   GLUCOSE 107 (H) 06/26/2016 1424   BUN 11 10/25/2017   CREATININE 0.5 (A) 10/25/2017   CREATININE 0.49 09/18/2017   CALCIUM 8.6 09/18/2017   PROT 8.3 (H) 06/26/2016 1424   ALBUMIN 3.6 06/26/2016 1424   AST 22 05/26/2017   ALT 15 05/26/2017   ALKPHOS 106 05/26/2017   BILITOT 0.5 06/26/2016 1424   GFRNONAA >60 06/26/2016 1424   GFRAA >90 09/18/2017   Recent Labs    06/05/17 09/18/17 10/25/17 10/30/17  NA 138 135 137  --   K 3.9 4.0 5.3 4.3  BUN 16 12 11   --   CREATININE 0.5* 0.49 0.5*  --   CALCIUM  --  8.6  --   --    Recent Labs    05/26/17  AST 22  ALT 15  ALKPHOS 106   Recent Labs    06/05/17 09/18/17 10/25/17  WBC 7.4 7.5 9.7  HGB 11.6* 11.0* 9.8*  HCT 38* 34* 30*  PLT 200 298 331   No results for input(s): CHOL, LDLCALC, TRIG in the last 8760 hours.  Invalid input(s): HCL Lab Results  Component Value Date   MICROALBUR 10.4  12/01/2016   Lab Results  Component Value Date   TSH 1.78 09/13/2016   Lab Results  Component Value Date   HGBA1C 5.0 03/24/2017   Lab Results  Component Value Date   CHOL 127 09/13/2016   HDL 55 09/13/2016   LDLCALC 70 09/13/2016   TRIG 79 09/13/2016    Significant Diagnostic Results in last 30 days:  No results found.  Assessment and Plan  No problem-specific Assessment & Plan notes found for this encounter.   Labs/tests ordered:    [name]. [name] Coil, MD   This encounter was created in error - please disregard.

## 2017-11-26 ENCOUNTER — Encounter: Payer: Self-pay | Admitting: Internal Medicine

## 2017-11-26 NOTE — Progress Notes (Signed)
Location:      Place of Service:     [name] Duos, MD  Patient Care Team: [name] Duos, MD as PCP - General (Internal Medicine)  Extended Emergency Contact Information Primary Emergency Contact: Viswanathan,[name] Address: [address]          [place], [place] [address] [name] Phone: [phone] Mobile Phone: [phone] Relation: Mother Secondary Emergency Contact: Constance Haw [place] Phone: [phone] Relation: Brother Father: [place], [place] Mobile Phone: [phone]    Allergies: Aspirin; Keflex [cephalexin]; Nsaids; and Penicillins  Chief Complaint  Patient presents with  . Acute Visit    HPI: Patient is 62 y.o. male who is being seen for a possible respiratory infection.  Patient was reported to have a fever over the weekend and he vomited x1, which he often does when he is sick.  No reported cough.  Patient is increased respiratory rate and rhonchi in the right on exam.  Past Medical History:  Diagnosis Date  . Acute encephalopathy 01/02/2014  . Adrenal insufficiency ([place]) 11/08/2011  . Adrenocortical insufficiency ([place])   . Allergic rhinitis   . Anemia    Iron deficient  . Aphasia 06/19/2013  . Cataract   . Clostridium difficile colitis 02/2014  . Constipation   . Contracture of joint of left hand   . Depression   . Diabetic gastroparesis ([place]) 11/08/2011  . Dysphagia 06/19/2013  . Gastroesophageal reflux disease 03/17/2015  . GERD (gastroesophageal reflux disease)   . Glucocorticoid deficiency ([place]) 08/10/2012  . History of colon polyps 05/22/2015  . Ileus ([place]) 11/08/2011  . Impaired verbal communication   . Iron deficiency anemia 08/10/2012  . Lung nodule 05/22/2015  . Nontraumatic chronic subdural hemorrhage ([place])   . Parkinson disease ([place])   . Parkinson's disease ([place])   . PEG (percutaneous endoscopic gastrostomy) status ([place]) 10/04/2013  . Pneumonia 07/2014   aspiration pneumonia  . Pyelonephritis   . Quadriplegia ([place])   . S/P percutaneous endoscopic gastrostomy (PEG) tube placement ([place])   . Seizures ([place])    last occurance 2015  . SIRS (systemic inflammatory response syndrome) ([place]) 08/06/2014  . Subdural hematoma ([place]) 01/02/2014  . Type 2 diabetes mellitus without complication ([place]) 78/58/8502  . UTI (urinary tract infection) 07/2014  . Vitamin D deficiency 10/16/2016  . Wheelchair bound     Past Surgical History:  Procedure Laterality Date  . COLONOSCOPY    . ESOPHAGOGASTRODUODENOSCOPY    . PEG TUBE PLACEMENT    . PERIPHERALLY INSERTED CENTRAL CATHETER INSERTION    . TONSILLECTOMY  1962    Allergies as of 11/20/2017      Reactions   Aspirin Other (See Comments)   Unknown reaction; "blood doesn't clog too well" per mother.    Keflex [cephalexin]    Rash   Nsaids Other (See Comments)   Unknown reaction   Penicillins Hives, Other (See Comments)   Unknown reaction.  Pt has tolerated cephalosporins in the past.      Medication List        Accurate as of 11/20/17 11:59 PM. Always use your most recent med list.          acetaminophen 325 MG tablet Commonly known as:  TYLENOL Place 650 mg into feeding tube every 6 (six) hours as needed. Fever > 101   amantadine 50 MG/5ML solution Commonly known as:  SYMMETREL Take 100 mg by mouth. GIVE 10 CC (100 MG) BY  PEG BID   Baclofen 5 MG Tabs Take by mouth. Give 1 tablet by Per Gtube route 4 times daily   bisacodyl 10 MG suppository Commonly known as:  DULCOLAX Place 10 mg rectally daily as needed for moderate constipation. If constipation not relieved by milk of magnesia give one 10 mg suppository in 24hours   CENTAMIN Liqd 5 mLs by PEG Tube route daily.   CETAPHIL GENTLE CLEANSER Liqd Apply to face and neck with water and then pat dry daily.   Cholecalciferol 5000 UNIT/ML Liqd Take by mouth. 10 mls by Per G Tube route once a week. Wednesdays   DILANTIN 125 MG/5ML  suspension Generic drug:  phenytoin Place 100 mg into feeding tube 2 (two) times daily.   feeding supplement (JEVITY 1.5 CAL) Liqd Place into feeding tube. Initiate Jevity 1.5 cal @@ 58 ml/hr continuous x 20 hr via peg ( hold 1 hr pre/post phenton administration)   ferrous sulfate 220 (44 Fe) MG/5ML solution Take 220 mg by mouth. 7.5 ML VIA TUBE ONCE DAILY FOR ANEMIA   folic acid 1 MG tablet Commonly known as:  FOLVITE 1 mg by PEG Tube route daily.   hydrocortisone 20 MG tablet Commonly known as:  CORTEF Take 20 mg by mouth. Give 1 tablet via per tube twice daily   loratadine 10 MG tablet Commonly known as:  CLARITIN 10 mg by PEG Tube route daily.   Magnesium 400 MG Tabs Take by mouth. Give 1 tablet by Per Gtube twice daily for supplement   magnesium hydroxide 400 MG/5ML suspension Commonly known as:  MILK OF MAGNESIA Take 30 mLs by mouth daily as needed for mild constipation.   metoCLOPramide 10 MG tablet Commonly known as:  REGLAN Place 1 tablet (10 mg total) into feeding tube 4 (four) times daily -  before meals and at bedtime. Via Peg tube   olopatadine 0.1 % ophthalmic solution Commonly known as:  PATANOL Place 1 drop into both eyes every morning.   pantoprazole sodium 40 mg/20 mL Pack Commonly known as:  PROTONIX Take by mouth 2 (two) times daily.   polyethylene glycol packet Commonly known as:  MIRALAX / GLYCOLAX Place 17 g into feeding tube daily as needed.   polyvinyl alcohol 1.4 % ophthalmic solution Commonly known as:  LIQUIFILM TEARS Place 1 drop into both eyes daily at 6 (six) AM.   promethazine 25 MG tablet Commonly known as:  PHENERGAN Place 25 mg into feeding tube every 6 (six) hours as needed for nausea or vomiting.   RA SALINE ENEMA 19-7 GM/118ML Enem Place 1 each rectally as needed (for constipation).   simethicone 40 MG/0.6ML drops Commonly known as:  MYLICON Take 40 mg by mouth. Give 1.2 mls ( 80 mg total ) by G-tube twice daily     sterile water for irrigation Flush 238ml of water into peg tub every 4 hours       No orders of the defined types were placed in this encounter.   Immunization History  Administered Date(s) Administered  . Influenza Whole 12/31/2012  . Influenza-Unspecified 12/29/2011, 12/31/2012, 12/22/2014, 01/22/2016, 01/05/2017  . PPD Test 08/22/2008  . Pneumococcal-Unspecified 06/23/2010, 06/23/2015    Social History   Tobacco Use  . Smoking status: Never Smoker  . Smokeless tobacco: Never Used  Substance Use Topics  . Alcohol use: No    Review of Systems  DATA OBTAINED: from patient-limited; nursing- as per history of present illness GENERAL: Fever over the weekend, no fatigue, appetite changes  SKIN: No itching, rash HEENT: No complaint RESPIRATORY: No cough, wheezing, SOB CARDIAC: No chest pain, palpitations, lower extremity edema  GI: No abdominal pain, No N/V/D or constipation, No heartburn or reflux  GU: No dysuria, frequency or urgency, or incontinence  MUSCULOSKELETAL: No unrelieved bone/joint pain NEUROLOGIC: No headache, dizziness  PSYCHIATRIC: No overt anxiety or sadness  Vitals:   11/26/17 1418  BP: 125/75  Pulse: 70  Resp: 18  Temp: 98 F (36.7 C)   There is no height or weight on file to calculate BMI. Physical Exam  GENERAL APPEARANCE: Alert, on conversant, No acute distress  SKIN: No diaphoresis rash HEENT: Unremarkable RESPIRATORY: Breathing is even, unlabored. Lung sounds are rhonchi on the right; slightly increased respiratory rate CARDIOVASCULAR: Heart RRR no murmurs, rubs or gallops. No peripheral edema  GASTROINTESTINAL: Abdomen is soft, non-tender, not distended w/ normal bowel sounds.  GENITOURINARY: Bladder non tender, not distended  MUSCULOSKELETAL: No abnormal joints or musculature NEUROLOGIC: Functional quadriplegia PSYCHIATRIC: Mood and affect appropriate to situation, no behavioral issues  Patient Active Problem List   Diagnosis Date  Noted  . Mental status change resolved 11/05/2017  . Hematemesis 10/29/2017  . Upper GI bleed 10/29/2017  . Erosive esophagitis 10/29/2017  . Pseudomonas urinary tract infection 10/29/2017  . Volume overload 10/29/2017  . Compression fracture of lumbar spine, non-traumatic, sequela 10/29/2017  . Cheyne-Stokes breathing 10/29/2017  . Hyperkalemia 10/26/2017  . Urinary tract infection due to Proteus 09/21/2017  . Infection of PEG site ([place]) 09/21/2017  . Hydronephrosis with renal and ureteral calculus obstruction 09/21/2017  . Left ureteral calculus 09/21/2017  . Flexion contractures 09/21/2017  . Vitamin D deficiency 10/16/2016  . Anemia of chronic disease   . GERD (gastroesophageal reflux disease)   . Clostridium difficile colitis   . Adrenocortical insufficiency ([place])   . Cataract 06/13/2016  . Constipation 06/13/2016  . Contracture of joint of left hand 06/13/2016  . Impaired verbal communication 06/13/2016  . Nontraumatic chronic subdural hemorrhage ([place]) 06/13/2016  . Nonverbal 06/13/2016  . Wheelchair bound 06/13/2016  . Allergic rhinitis 06/13/2016  . S/P percutaneous endoscopic gastrostomy (PEG) tube placement ([place]) 06/13/2016  . Itchy eyes 11/12/2015  . Benign neoplasm of colon 05/22/2015  . History of colon polyps 05/22/2015  . Lung nodule 05/22/2015  . Abnormal levels of other serum enzymes 05/22/2015  . Gastroesophageal reflux disease 03/17/2015  . Tinea cruris 09/10/2014  . Aspiration pneumonia ([place]) 08/15/2014  . Abscess, prostate 08/15/2014  . Pyelonephritis 08/06/2014  . Nausea & vomiting   . Fever presenting with conditions classified elsewhere 03/16/2014  . Altered mental status 03/16/2014  . Acute bronchiolitis due to unspecified organism 03/11/2014  . Enteritis due to Clostridium difficile 03/11/2014  . Type 2 diabetes mellitus without complication ([place]) 63/87/5643  . Septic shock ([place]) 01/26/2014  . Sepsis ([place]) 01/25/2014  . Acute encephalopathy  01/02/2014  . Acute respiratory failure with hypercapnia ([place]) 01/02/2014  . Chronic anemia 01/02/2014  . Subdural hematoma ([place]) 01/02/2014  . Pain in thoracic spine 12/26/2013  . PEG (percutaneous endoscopic gastrostomy) status ([place]) 10/04/2013  . Rash and nonspecific skin eruption 07/08/2013  . Thrush 07/08/2013  . Heme positive stool 07/07/2013  . Severe sepsis ([place]) 06/23/2013  . E. coli pyelonephritis 06/22/2013  . SIRS (systemic inflammatory response syndrome) ([place]) 06/19/2013  . Leukocytosis 06/19/2013  . Bronchitis 06/19/2013  . Dysphagia 06/19/2013  . Aphasia 06/19/2013  . Other convulsions 08/10/2012  . Iron deficiency anemia 08/10/2012  . Glucocorticoid deficiency ([place]) 08/10/2012  .  Febrile illness, acute 06/15/2012  . UTI (lower urinary tract infection) 06/15/2012  . Hypokalemia 11/10/2011  . Diabetic gastroparesis ([place]) 11/08/2011  . Ileus ([place]) 11/08/2011  . Hyponatremia 11/08/2011  . Adrenal insufficiency ([place]) 11/08/2011  . Quadriplegia ([place]) 11/08/2011  . Parkinson disease ([place])   . Seizures ([place])   . Depression     CMP     Component Value Date/Time   NA 137 10/25/2017   NA 135 09/18/2017   K 4.3 10/30/2017   K 4.0 09/18/2017   CL 108 06/26/2016 1424   CO2 26 06/26/2016 1424   GLUCOSE 107 (H) 06/26/2016 1424   BUN 11 10/25/2017   CREATININE 0.5 (A) 10/25/2017   CREATININE 0.49 09/18/2017   CALCIUM 8.6 09/18/2017   PROT 8.3 (H) 06/26/2016 1424   ALBUMIN 3.6 06/26/2016 1424   AST 22 05/26/2017   ALT 15 05/26/2017   ALKPHOS 106 05/26/2017   BILITOT 0.5 06/26/2016 1424   GFRNONAA >60 06/26/2016 1424   GFRAA >90 09/18/2017   Recent Labs    06/05/17 09/18/17 10/25/17 10/30/17  NA 138 135 137  --   K 3.9 4.0 5.3 4.3  BUN 16 12 11   --   CREATININE 0.5* 0.49 0.5*  --   CALCIUM  --  8.6  --   --    Recent Labs    05/26/17  AST 22  ALT 15  ALKPHOS 106   Recent Labs    06/05/17 09/18/17 10/25/17  WBC 7.4 7.5 9.7  HGB 11.6* 11.0* 9.8*    HCT 38* 34* 30*  PLT 200 298 331   No results for input(s): CHOL, LDLCALC, TRIG in the last 8760 hours.  Invalid input(s): HCL Lab Results  Component Value Date   MICROALBUR 10.4 12/01/2016   Lab Results  Component Value Date   TSH 1.78 09/13/2016   Lab Results  Component Value Date   HGBA1C 5.0 03/24/2017   Lab Results  Component Value Date   CHOL 127 09/13/2016   HDL 55 09/13/2016   LDLCALC 70 09/13/2016   TRIG 79 09/13/2016    Significant Diagnostic Results in last 30 days:  No results found.  Assessment and Plan  Bronchitis versus pneumonia- chest x-ray showed bilateral airspace disease or bronchitis; exam consistent with a lower respiratory infection we will start Rocephin 1 g IM x7 days; monitor response    Inocencio Homes, MD

## 2017-12-01 ENCOUNTER — Non-Acute Institutional Stay (SKILLED_NURSING_FACILITY): Payer: Medicare Other | Admitting: Internal Medicine

## 2017-12-01 DIAGNOSIS — H1033 Unspecified acute conjunctivitis, bilateral: Secondary | ICD-10-CM

## 2017-12-04 DIAGNOSIS — R140 Abdominal distension (gaseous): Secondary | ICD-10-CM | POA: Diagnosis not present

## 2017-12-04 DIAGNOSIS — Z431 Encounter for attention to gastrostomy: Secondary | ICD-10-CM | POA: Diagnosis not present

## 2017-12-08 DIAGNOSIS — Z9689 Presence of other specified functional implants: Secondary | ICD-10-CM | POA: Diagnosis not present

## 2017-12-08 DIAGNOSIS — K922 Gastrointestinal hemorrhage, unspecified: Secondary | ICD-10-CM | POA: Diagnosis not present

## 2017-12-08 DIAGNOSIS — K920 Hematemesis: Secondary | ICD-10-CM | POA: Diagnosis not present

## 2017-12-08 DIAGNOSIS — R197 Diarrhea, unspecified: Secondary | ICD-10-CM | POA: Diagnosis not present

## 2017-12-08 DIAGNOSIS — R8589 Other abnormal findings in specimens from digestive organs and abdominal cavity: Secondary | ICD-10-CM | POA: Diagnosis not present

## 2017-12-08 DIAGNOSIS — G8250 Quadriplegia, unspecified: Secondary | ICD-10-CM | POA: Diagnosis not present

## 2017-12-08 DIAGNOSIS — Z8719 Personal history of other diseases of the digestive system: Secondary | ICD-10-CM | POA: Diagnosis not present

## 2017-12-08 DIAGNOSIS — R509 Fever, unspecified: Secondary | ICD-10-CM | POA: Diagnosis not present

## 2017-12-08 DIAGNOSIS — Z931 Gastrostomy status: Secondary | ICD-10-CM | POA: Diagnosis not present

## 2017-12-08 DIAGNOSIS — R404 Transient alteration of awareness: Secondary | ICD-10-CM | POA: Diagnosis not present

## 2017-12-08 DIAGNOSIS — R110 Nausea: Secondary | ICD-10-CM | POA: Diagnosis not present

## 2017-12-08 DIAGNOSIS — Z9889 Other specified postprocedural states: Secondary | ICD-10-CM | POA: Diagnosis not present

## 2017-12-08 DIAGNOSIS — Z8669 Personal history of other diseases of the nervous system and sense organs: Secondary | ICD-10-CM | POA: Diagnosis not present

## 2017-12-08 DIAGNOSIS — R1110 Vomiting, unspecified: Secondary | ICD-10-CM | POA: Diagnosis not present

## 2017-12-08 DIAGNOSIS — R000 Tachycardia, unspecified: Secondary | ICD-10-CM | POA: Diagnosis not present

## 2017-12-08 DIAGNOSIS — R001 Bradycardia, unspecified: Secondary | ICD-10-CM | POA: Diagnosis not present

## 2017-12-09 DIAGNOSIS — K219 Gastro-esophageal reflux disease without esophagitis: Secondary | ICD-10-CM | POA: Diagnosis not present

## 2017-12-09 DIAGNOSIS — Z87828 Personal history of other (healed) physical injury and trauma: Secondary | ICD-10-CM | POA: Diagnosis not present

## 2017-12-09 DIAGNOSIS — Z7401 Bed confinement status: Secondary | ICD-10-CM | POA: Diagnosis not present

## 2017-12-09 DIAGNOSIS — Z8669 Personal history of other diseases of the nervous system and sense organs: Secondary | ICD-10-CM | POA: Diagnosis not present

## 2017-12-09 DIAGNOSIS — K2210 Ulcer of esophagus without bleeding: Secondary | ICD-10-CM | POA: Diagnosis not present

## 2017-12-09 DIAGNOSIS — D62 Acute posthemorrhagic anemia: Secondary | ICD-10-CM | POA: Diagnosis not present

## 2017-12-09 DIAGNOSIS — Z9889 Other specified postprocedural states: Secondary | ICD-10-CM | POA: Diagnosis not present

## 2017-12-09 DIAGNOSIS — Z8679 Personal history of other diseases of the circulatory system: Secondary | ICD-10-CM | POA: Diagnosis not present

## 2017-12-09 DIAGNOSIS — Z8639 Personal history of other endocrine, nutritional and metabolic disease: Secondary | ICD-10-CM | POA: Diagnosis not present

## 2017-12-09 DIAGNOSIS — Z8719 Personal history of other diseases of the digestive system: Secondary | ICD-10-CM | POA: Diagnosis not present

## 2017-12-09 DIAGNOSIS — Z9689 Presence of other specified functional implants: Secondary | ICD-10-CM | POA: Diagnosis not present

## 2017-12-09 DIAGNOSIS — K920 Hematemesis: Secondary | ICD-10-CM | POA: Diagnosis not present

## 2017-12-09 DIAGNOSIS — K210 Gastro-esophageal reflux disease with esophagitis: Secondary | ICD-10-CM | POA: Diagnosis not present

## 2017-12-10 ENCOUNTER — Encounter: Payer: Self-pay | Admitting: Internal Medicine

## 2017-12-10 DIAGNOSIS — K920 Hematemesis: Secondary | ICD-10-CM | POA: Diagnosis not present

## 2017-12-10 DIAGNOSIS — G8250 Quadriplegia, unspecified: Secondary | ICD-10-CM | POA: Diagnosis not present

## 2017-12-10 DIAGNOSIS — M24542 Contracture, left hand: Secondary | ICD-10-CM | POA: Diagnosis not present

## 2017-12-10 DIAGNOSIS — R293 Abnormal posture: Secondary | ICD-10-CM | POA: Diagnosis not present

## 2017-12-10 DIAGNOSIS — M6281 Muscle weakness (generalized): Secondary | ICD-10-CM | POA: Diagnosis not present

## 2017-12-10 DIAGNOSIS — M6240 Contracture of muscle, unspecified site: Secondary | ICD-10-CM | POA: Diagnosis not present

## 2017-12-10 DIAGNOSIS — M24531 Contracture, right wrist: Secondary | ICD-10-CM | POA: Diagnosis not present

## 2017-12-10 DIAGNOSIS — I69822 Dysarthria following other cerebrovascular disease: Secondary | ICD-10-CM | POA: Diagnosis not present

## 2017-12-10 NOTE — Progress Notes (Signed)
Location:  [place] of Service:  SNF (31)  [name], MD  [facility] Care Team: [name], MD as PCP - General (Internal Medicine)  Extended Emergency Contact Information Primary Emergency Contact: Jolliff,[name] Address: [address]          [place],  [address] [name] Phone: [phone] Mobile Phone: [phone] Relation: Mother Secondary Emergency Contact: Constance Haw [place] Phone: [phone] Relation: Brother Father: Abdiaziz, Klahn [place] of [place] Mobile Phone: [phone]    Allergies: Aspirin; Keflex [cephalexin]; Nsaids; and Penicillins  Chief Complaint  [facility] presents with  . Acute Visit    [name] is 62 y.o. male who is being seen because the nurses noticed that his eyes are red left greater than right and admits that he had crusting in the corner of his eye this morning.  Chase has had no fever cold cough or other URI symptoms.  Past Medical History:  Diagnosis Date  . Acute encephalopathy 01/02/2014  . Adrenal insufficiency ([place]) 11/08/2011  . Adrenocortical insufficiency ([place])   . Allergic rhinitis   . Anemia    Iron deficient  . Aphasia 06/19/2013  . Cataract   . Clostridium difficile colitis 02/2014  . Constipation   . Contracture of joint of left hand   . Depression   . Diabetic gastroparesis ([place]) 11/08/2011  . Dysphagia 06/19/2013  . Gastroesophageal reflux disease 03/17/2015  . GERD (gastroesophageal reflux disease)   . Glucocorticoid deficiency ([place]) 08/10/2012  . History of colon polyps 05/22/2015  . Ileus ([place]) 11/08/2011  . Impaired verbal communication   . Iron deficiency anemia 08/10/2012  . Lung nodule 05/22/2015  . Nontraumatic chronic subdural hemorrhage ([place])   . Parkinson disease ([place])   . Parkinson's disease ([place])   . PEG (percutaneous endoscopic gastrostomy) status ([place]) 10/04/2013  . Pneumonia 07/2014   aspiration pneumonia  . Pyelonephritis   . Quadriplegia ([place])   . S/P percutaneous endoscopic gastrostomy (PEG) tube placement ([place])   . Seizures ([place])    last occurance 2015  . SIRS (systemic inflammatory response syndrome) ([place]) 08/06/2014  . Subdural hematoma ([place]) 01/02/2014  . Type 2 diabetes mellitus without complication ([place]) 38/46/6599  . UTI (urinary tract infection) 07/2014  . Vitamin D deficiency 10/16/2016  . Wheelchair bound     Past Surgical History:  Procedure Laterality Date  . COLONOSCOPY    . ESOPHAGOGASTRODUODENOSCOPY    . PEG TUBE PLACEMENT    . PERIPHERALLY INSERTED CENTRAL CATHETER INSERTION    . TONSILLECTOMY  1962    Allergies as of 12/01/2017      Reactions   Aspirin Other (See Comments)   Unknown reaction; "blood doesn't clog too well" per mother.    Keflex [cephalexin]    Rash   Nsaids Other (See Comments)   Unknown reaction   Penicillins Hives, Other (See Comments)   Unknown reaction.  Pt has tolerated cephalosporins in the past.      Medication List        Accurate as of 12/01/17 11:59 PM. Always use your most recent med list.          acetaminophen 325 MG tablet Commonly known as:  TYLENOL Place 650 mg into feeding tube every 6 (six) hours as needed. Fever > 101   amantadine 50 MG/5ML solution Commonly known as:  SYMMETREL Take 100 mg by mouth. GIVE 10 CC (100 MG) BY PEG BID  Baclofen 5 MG Tabs Take by mouth. Give 1 tablet by Per Gtube route 4 times daily   bisacodyl 10 MG suppository Commonly known as:  DULCOLAX Place 10 mg rectally daily as needed for moderate constipation. If constipation not relieved by milk of magnesia give one 10 mg suppository in 24hours   CENTAMIN Liqd 5 mLs by PEG Tube route daily.   CETAPHIL GENTLE CLEANSER Liqd Apply to face and neck with water and then pat dry daily.   Cholecalciferol 5000 UNIT/ML Liqd Take by mouth. 10 mls by Per G Tube route once a week. Wednesdays   DILANTIN 125 MG/5ML  suspension Generic drug:  phenytoin Place 100 mg into feeding tube 2 (two) times daily.   feeding supplement (JEVITY 1.5 CAL) Liqd Place into feeding tube. Initiate Jevity 1.5 cal @@ 58 ml/hr continuous x 20 hr via peg ( hold 1 hr pre/post phenton administration)   ferrous sulfate 220 (44 Fe) MG/5ML solution Take 220 mg by mouth. 7.5 ML VIA TUBE ONCE DAILY FOR ANEMIA   folic acid 1 MG tablet Commonly known as:  FOLVITE 1 mg by PEG Tube route daily.   hydrocortisone 20 MG tablet Commonly known as:  CORTEF Take 20 mg by mouth. Give 1 tablet via per tube twice daily   loratadine 10 MG tablet Commonly known as:  CLARITIN 10 mg by PEG Tube route daily.   Magnesium 400 MG Tabs Take by mouth. Give 1 tablet by Per Gtube twice daily for supplement   magnesium hydroxide 400 MG/5ML suspension Commonly known as:  MILK OF MAGNESIA Take 30 mLs by mouth daily as needed for mild constipation.   metoCLOPramide 10 MG tablet Commonly known as:  REGLAN Place 1 tablet (10 mg total) into feeding tube 4 (four) times daily -  before meals and at bedtime. Via Peg tube   olopatadine 0.1 % ophthalmic solution Commonly known as:  PATANOL Place 1 drop into both eyes every morning.   pantoprazole sodium 40 mg/20 mL Pack Commonly known as:  PROTONIX Take by mouth 2 (two) times daily.   polyethylene glycol packet Commonly known as:  MIRALAX / GLYCOLAX Place 17 g into feeding tube daily as needed.   polyvinyl alcohol 1.4 % ophthalmic solution Commonly known as:  LIQUIFILM TEARS Place 1 drop into both eyes daily at 6 (six) AM.   promethazine 25 MG tablet Commonly known as:  PHENERGAN Place 25 mg into feeding tube every 6 (six) hours as needed for nausea or vomiting.   RA SALINE ENEMA 19-7 GM/118ML Enem Place 1 each rectally as needed (for constipation).   simethicone 40 MG/0.6ML drops Commonly known as:  MYLICON Take 40 mg by mouth. Give 1.2 mls ( 80 mg total ) by G-tube twice daily     sterile water for irrigation Flush 269ml of water into peg tub every 4 hours       No orders of the defined types were placed in this encounter.   Immunization History  Administered Date(s) Administered  . Influenza Whole 12/31/2012  . Influenza-Unspecified 12/29/2011, 12/31/2012, 12/22/2014, 01/22/2016, 01/05/2017  . PPD Test 08/22/2008  . Pneumococcal-Unspecified 06/23/2010, 06/23/2015    Social History   Tobacco Use  . Smoking status: Never Smoker  . Smokeless tobacco: Never Used  Substance Use Topics  . Alcohol use: No    Review of Systems  DATA OBTAINED: from [facility]-limited; nurse-as per history of present illness GENERAL:  no fevers, fatigue, appetite changes SKIN: No itching, rash HEENT: Both eyes  injected RESPIRATORY: No cough, wheezing, SOB CARDIAC: No chest pain, palpitations, lower extremity edema  GI: No abdominal pain, No N/V/D or constipation, No heartburn or reflux  GU: No dysuria, frequency or urgency, or incontinence  MUSCULOSKELETAL: No unrelieved bone/joint pain NEUROLOGIC: No headache, dizziness  PSYCHIATRIC: No overt anxiety or sadness  Vitals:   12/10/17 1405  BP: 100/65  Pulse: 68  Resp: 18  Temp: (!) 97.1 F (36.2 C)   There is no height or weight on file to calculate BMI. Physical Exam  GENERAL APPEARANCE: Alert, nonconversant  No acute distress  SKIN: No diaphoresis rash HEENT: Conjunctiva with bilateral injection left greater than right; mild crusting medial canthus bilateral RESPIRATORY: Breathing is even, unlabored. Lung sounds are clear   CARDIOVASCULAR: Heart RRR no murmurs, rubs or gallops. No peripheral edema  GASTROINTESTINAL: Abdomen is soft, non-tender, not distended w/ normal bowel sounds.  GENITOURINARY: Bladder non tender, not distended  MUSCULOSKELETAL: No abnormal joints or musculature NEUROLOGIC: Cranial nerves 2-12 grossly intact: Functional quad PSYCHIATRIC: Mood and affect appropriate to situation, no  behavioral issues  [facility] Active Problem List   Diagnosis Date Noted  . Mental status change resolved 11/05/2017  . Hematemesis 10/29/2017  . Upper GI bleed 10/29/2017  . Erosive esophagitis 10/29/2017  . Pseudomonas urinary tract infection 10/29/2017  . Volume overload 10/29/2017  . Compression fracture of lumbar spine, non-traumatic, sequela 10/29/2017  . Cheyne-Stokes breathing 10/29/2017  . Hyperkalemia 10/26/2017  . Urinary tract infection due to Proteus 09/21/2017  . Infection of PEG site ([place]) 09/21/2017  . Hydronephrosis with renal and ureteral calculus obstruction 09/21/2017  . Left ureteral calculus 09/21/2017  . Flexion contractures 09/21/2017  . Vitamin D deficiency 10/16/2016  . Anemia of chronic disease   . GERD (gastroesophageal reflux disease)   . Clostridium difficile colitis   . Adrenocortical insufficiency ([place])   . Cataract 06/13/2016  . Constipation 06/13/2016  . Contracture of joint of left hand 06/13/2016  . Impaired verbal communication 06/13/2016  . Nontraumatic chronic subdural hemorrhage ([place]) 06/13/2016  . Nonverbal 06/13/2016  . Wheelchair bound 06/13/2016  . Allergic rhinitis 06/13/2016  . S/P percutaneous endoscopic gastrostomy (PEG) tube placement ([place]) 06/13/2016  . Itchy eyes 11/12/2015  . Benign neoplasm of colon 05/22/2015  . History of colon polyps 05/22/2015  . Lung nodule 05/22/2015  . Abnormal levels of other serum enzymes 05/22/2015  . Gastroesophageal reflux disease 03/17/2015  . Tinea cruris 09/10/2014  . Aspiration pneumonia ([place]) 08/15/2014  . Abscess, prostate 08/15/2014  . Pyelonephritis 08/06/2014  . Nausea & vomiting   . Fever presenting with conditions classified elsewhere 03/16/2014  . Altered mental status 03/16/2014  . Acute bronchiolitis due to unspecified organism 03/11/2014  . Enteritis due to Clostridium difficile 03/11/2014  . Type 2 diabetes mellitus without complication ([place]) 84/66/5993  . Septic shock ([place])  01/26/2014  . Sepsis ([place]) 01/25/2014  . Acute encephalopathy 01/02/2014  . Acute respiratory failure with hypercapnia ([place]) 01/02/2014  . Chronic anemia 01/02/2014  . Subdural hematoma ([place]) 01/02/2014  . Pain in thoracic spine 12/26/2013  . PEG (percutaneous endoscopic gastrostomy) status ([place]) 10/04/2013  . Rash and nonspecific skin eruption 07/08/2013  . Thrush 07/08/2013  . Heme positive stool 07/07/2013  . Severe sepsis ([place]) 06/23/2013  . E. coli pyelonephritis 06/22/2013  . SIRS (systemic inflammatory response syndrome) ([place]) 06/19/2013  . Leukocytosis 06/19/2013  . Bronchitis 06/19/2013  . Dysphagia 06/19/2013  . Aphasia 06/19/2013  . Other convulsions 08/10/2012  . Iron deficiency anemia 08/10/2012  .  Glucocorticoid deficiency ([place]) 08/10/2012  . Febrile illness, acute 06/15/2012  . UTI (lower urinary tract infection) 06/15/2012  . Hypokalemia 11/10/2011  . Diabetic gastroparesis ([place]) 11/08/2011  . Ileus ([place]) 11/08/2011  . Hyponatremia 11/08/2011  . Adrenal insufficiency ([place]) 11/08/2011  . Quadriplegia ([place]) 11/08/2011  . Parkinson disease ([place])   . Seizures ([place])   . Depression     CMP     Component Value Date/Time   NA 137 10/25/2017   NA 135 09/18/2017   K 4.3 10/30/2017   K 4.0 09/18/2017   CL 108 06/26/2016 1424   CO2 26 06/26/2016 1424   GLUCOSE 107 (H) 06/26/2016 1424   BUN 11 10/25/2017   CREATININE 0.5 (A) 10/25/2017   CREATININE 0.49 09/18/2017   CALCIUM 8.6 09/18/2017   PROT 8.3 (H) 06/26/2016 1424   ALBUMIN 3.6 06/26/2016 1424   AST 22 05/26/2017   ALT 15 05/26/2017   ALKPHOS 106 05/26/2017   BILITOT 0.5 06/26/2016 1424   GFRNONAA >60 06/26/2016 1424   GFRAA >90 09/18/2017   Recent Labs    06/05/17 09/18/17 10/25/17 10/30/17  NA 138 135 137  --   K 3.9 4.0 5.3 4.3  BUN 16 12 11   --   CREATININE 0.5* 0.49 0.5*  --   CALCIUM  --  8.6  --   --    Recent Labs    05/26/17  AST 22  ALT 15  ALKPHOS 106   Recent Labs    06/05/17  09/18/17 10/25/17  WBC 7.4 7.5 9.7  HGB 11.6* 11.0* 9.8*  HCT 38* 34* 30*  PLT 200 298 331   No results for input(s): CHOL, LDLCALC, TRIG in the last 8760 hours.  Invalid input(s): HCL Lab Results  Component Value Date   MICROALBUR 10.4 12/01/2016   Lab Results  Component Value Date   TSH 1.78 09/13/2016   Lab Results  Component Value Date   HGBA1C 5.0 03/24/2017   Lab Results  Component Value Date   CHOL 127 09/13/2016   HDL 55 09/13/2016   LDLCALC 70 09/13/2016   TRIG 79 09/13/2016    Significant Diagnostic Results in last 30 days:  No results found.  Assessment and Plan  Conjunctivitis- Garamycin ointment or drops 4 times daily x7 days     Inocencio Homes, MD

## 2017-12-11 ENCOUNTER — Encounter: Payer: Self-pay | Admitting: Internal Medicine

## 2017-12-11 ENCOUNTER — Non-Acute Institutional Stay (SKILLED_NURSING_FACILITY): Payer: Medicare Other | Admitting: Internal Medicine

## 2017-12-11 DIAGNOSIS — D62 Acute posthemorrhagic anemia: Secondary | ICD-10-CM

## 2017-12-11 DIAGNOSIS — K920 Hematemesis: Secondary | ICD-10-CM

## 2017-12-11 DIAGNOSIS — E2749 Other adrenocortical insufficiency: Secondary | ICD-10-CM

## 2017-12-11 DIAGNOSIS — M24542 Contracture, left hand: Secondary | ICD-10-CM | POA: Diagnosis not present

## 2017-12-11 DIAGNOSIS — R569 Unspecified convulsions: Secondary | ICD-10-CM

## 2017-12-11 DIAGNOSIS — K3184 Gastroparesis: Secondary | ICD-10-CM | POA: Diagnosis not present

## 2017-12-11 DIAGNOSIS — M24531 Contracture, right wrist: Secondary | ICD-10-CM | POA: Diagnosis not present

## 2017-12-11 DIAGNOSIS — E2740 Unspecified adrenocortical insufficiency: Secondary | ICD-10-CM | POA: Diagnosis not present

## 2017-12-11 DIAGNOSIS — K2100 Gastro-esophageal reflux disease with esophagitis, without bleeding: Secondary | ICD-10-CM

## 2017-12-11 DIAGNOSIS — R293 Abnormal posture: Secondary | ICD-10-CM | POA: Diagnosis not present

## 2017-12-11 DIAGNOSIS — G8250 Quadriplegia, unspecified: Secondary | ICD-10-CM | POA: Diagnosis not present

## 2017-12-11 DIAGNOSIS — E1143 Type 2 diabetes mellitus with diabetic autonomic (poly)neuropathy: Secondary | ICD-10-CM

## 2017-12-11 DIAGNOSIS — I69822 Dysarthria following other cerebrovascular disease: Secondary | ICD-10-CM | POA: Diagnosis not present

## 2017-12-11 NOTE — Progress Notes (Signed)
:    Location:  [place] Room Number: [phone] Place of Service:  SNF (31)  [name]. [name], MD  Patient Care Team: [name], MD as PCP - General (Internal Medicine)  Extended Emergency Contact Information Primary Emergency Contact: Rafter,[name] Address: [address]          [place], [place] [address] [name] Phone: [phone] Mobile Phone: [phone] Relation: Mother Secondary Emergency Contact: Constance Haw [place] Phone: [phone] Relation: Brother Father: Yazeed, Pryer [place] of [place] Mobile Phone: [phone]     Allergies: Aspirin; Keflex [cephalexin]; Nsaids; and Penicillins  Chief Complaint  Patient presents with  . Readmit To SNF    Admit to [facility]    HPI: Patient is 62 y.o. male with seizure disorder, subdural hematoma resulting in quadriplegia, chronic bedbound, dysphagia, GERD, adrenal insufficiency, and gastroparesis, who was brought to [facility] emergency department for vomiting and hematemesis.  Patient was admitted to [facility] from 9/13-14 where he was seen in consult by gastro.  EGD was done on 9/14 with the findings of mild distal esophageal reflux esophagitis, small white base to GE junction erosion and internal gastrostomy balloon appears to be in good position.  He had had no more vomiting.  His vital signs were stable.  His hemoglobin dropped from 13-11.7.  Patient was discharged back to skilled nursing facility for residential care.  Endoscopy P findings were discussed with his mother and her questions were answered however her mother expressed frustration that she was seeking a second opinion in the future.  While at skilled nursing facility patient will be followed for adrenal insufficiency treated with Cortef, seizure disorder treated with Dilantin and diabetic gastroparesis treated with Reglan.  Past Medical  History:  Diagnosis Date  . Acute encephalopathy 01/02/2014  . Adrenal insufficiency ([place]) 11/08/2011  . Adrenocortical insufficiency ([place])   . Allergic rhinitis   . Anemia    Iron deficient  . Aphasia 06/19/2013  . Cataract   . Clostridium difficile colitis 02/2014  . Constipation   . Contracture of joint of left hand   . Depression   . Diabetic gastroparesis ([place]) 11/08/2011  . Dysphagia 06/19/2013  . Gastroesophageal reflux disease 03/17/2015  . GERD (gastroesophageal reflux disease)   . Glucocorticoid deficiency ([place]) 08/10/2012  . History of colon polyps 05/22/2015  . Ileus ([place]) 11/08/2011  . Impaired verbal communication   . Iron deficiency anemia 08/10/2012  . Lung nodule 05/22/2015  . Nontraumatic chronic subdural hemorrhage ([place])   . Parkinson disease ([place])   . Parkinson's disease ([place])   . PEG (percutaneous endoscopic gastrostomy) status ([place]) 10/04/2013  . Pneumonia 07/2014   aspiration pneumonia  . Pyelonephritis   . Quadriplegia ([place])   . S/P percutaneous endoscopic gastrostomy (PEG) tube placement ([place])   . Seizures ([place])    last occurance 2015  . SIRS (systemic inflammatory response syndrome) ([place]) 08/06/2014  . Subdural hematoma ([place]) 01/02/2014  . Type 2 diabetes mellitus without complication ([place]) 38/45/3646  . UTI (urinary tract infection) 07/2014  . Vitamin D deficiency 10/16/2016  . Wheelchair bound     Past Surgical History:  Procedure Laterality Date  . COLONOSCOPY    . ESOPHAGOGASTRODUODENOSCOPY    . PEG TUBE PLACEMENT    . PERIPHERALLY INSERTED CENTRAL CATHETER INSERTION    . TONSILLECTOMY  1962    Allergies as of 12/11/2017  Reactions   Aspirin Other (See Comments)   Unknown reaction; "blood doesn't clog too well" per mother.    Keflex [cephalexin]    Rash   Nsaids Other (See Comments)   Unknown reaction   Penicillins Hives, Other (See Comments)   Unknown reaction.  Pt has tolerated cephalosporins in the past.      Medication List          Accurate as of 12/11/17 12:57 PM. Always use your most recent med list.          acetaminophen 325 MG tablet Commonly known as:  TYLENOL Place 650 mg into feeding tube every 6 (six) hours as needed. Fever > 101   amantadine 50 MG/5ML solution Commonly known as:  SYMMETREL Take 100 mg by mouth. GIVE 10 CC (100 MG) BY PEG BID   Baclofen 5 MG Tabs Take by mouth. Give 1 tablet by Per Gtube route 4 times daily   bisacodyl 10 MG suppository Commonly known as:  DULCOLAX Place 10 mg rectally daily as needed for moderate constipation. If constipation not relieved by milk of magnesia give one 10 mg suppository in 24hours   CENTAMIN Liqd 5 mLs by PEG Tube route daily.   CETAPHIL GENTLE CLEANSER Liqd Apply to face and neck with water and then pat dry daily.   Cholecalciferol 5000 UNIT/ML Liqd Take by mouth. 10 mls by Per G Tube route once a week. Wednesdays   DILANTIN 125 MG/5ML suspension Generic drug:  phenytoin Place 100 mg into feeding tube 2 (two) times daily.   feeding supplement (JEVITY 1.5 CAL) Liqd Place into feeding tube. Initiate Jevity 1.5 cal @@ 58 ml/hr continuous x 20 hr via peg ( hold 1 hr pre/post phenton administration)   ferrous sulfate 220 (44 Fe) MG/5ML solution Take 220 mg by mouth. 7.5 ML VIA TUBE ONCE DAILY FOR ANEMIA   folic acid 1 MG tablet Commonly known as:  FOLVITE 1 mg by PEG Tube route daily.   hydrocortisone 20 MG tablet Commonly known as:  CORTEF Take 20 mg by mouth. Give 1 tablet via per tube twice daily   loratadine 10 MG tablet Commonly known as:  CLARITIN 10 mg by PEG Tube route daily.   Magnesium 400 MG Tabs Take by mouth. Give 1 tablet by Per Gtube twice daily for supplement   magnesium hydroxide 400 MG/5ML suspension Commonly known as:  MILK OF MAGNESIA Take 30 mLs by mouth daily as needed for mild constipation.   metoCLOPramide 10 MG tablet Commonly known as:  REGLAN Place 1 tablet (10 mg total) into feeding tube 4 (four)  times daily -  before meals and at bedtime. Via Peg tube   olopatadine 0.1 % ophthalmic solution Commonly known as:  PATANOL Place 1 drop into both eyes every morning.   pantoprazole sodium 40 mg/20 mL Pack Commonly known as:  PROTONIX Take by mouth 2 (two) times daily.   polyethylene glycol packet Commonly known as:  MIRALAX / GLYCOLAX Place 17 g into feeding tube daily as needed.   polyvinyl alcohol 1.4 % ophthalmic solution Commonly known as:  LIQUIFILM TEARS Place 1 drop into both eyes daily at 6 (six) AM.   promethazine 25 MG tablet Commonly known as:  PHENERGAN Place 25 mg into feeding tube every 6 (six) hours as needed for nausea or vomiting.   RA SALINE ENEMA 19-7 GM/118ML Enem Place 1 each rectally as needed (for constipation).   simethicone 40 MG/0.6ML drops Commonly known as:  MYLICON  Take 40 mg by mouth. Give 1.2 mls ( 80 mg total ) by G-tube twice daily   sterile water for irrigation Flush 245ml of water into peg tub every 4 hours       No orders of the defined types were placed in this encounter.   Immunization History  Administered Date(s) Administered  . Influenza Whole 12/31/2012  . Influenza-Unspecified 12/29/2011, 12/31/2012, 12/22/2014, 01/22/2016, 01/05/2017  . PPD Test 08/22/2008  . Pneumococcal-Unspecified 06/23/2010, 06/23/2015    Social History   Tobacco Use  . Smoking status: Never Smoker  . Smokeless tobacco: Never Used  Substance Use Topics  . Alcohol use: No    Family history is   Family History  Problem Relation Age of Onset  . Hypothyroidism Mother   . Hypertension Mother   . Diabetes Father   . Hypertension Father   . Diabetes Paternal Grandfather       Review of Systems  DATA OBTAINED: from nurse GENERAL:  no fevers, fatigue, appetite changes SKIN: No itching, or rash EYES: No eye pain, redness, discharge EARS: No earache, tinnitus, change in hearing NOSE: No congestion, drainage or bleeding  MOUTH/THROAT: No  mouth or tooth pain, No sore throat RESPIRATORY: No cough, wheezing, SOB CARDIAC: No chest pain, palpitations, lower extremity edema  GI: No abdominal pain, No N/V/D or constipation, No heartburn or reflux  GU: No dysuria, frequency or urgency, or incontinence  MUSCULOSKELETAL: No unrelieved bone/joint pain NEUROLOGIC: No headache, dizziness or focal weakness PSYCHIATRIC: No c/o anxiety or sadness   Vitals:   12/11/17 1219  BP: 121/70  Pulse: 70  Resp: 18  Temp: 98 F (36.7 C)    SpO2 Readings from Last 1 Encounters:  09/22/17 92%   Body mass index is 23.66 kg/m.     Physical Exam  GENERAL APPEARANCE: Alert, non-conversant,  No acute distress.  SKIN: No diaphoresis rash HEAD: Normocephalic, atraumatic  EYES: Conjunctiva/lids clear. Pupils round, reactive. EOMs intact.  EARS: External exam WNL, canals clear. Hearing grossly normal.  NOSE: No deformity or discharge.  MOUTH/THROAT: Lips w/o lesions  RESPIRATORY: Breathing is even, unlabored. Lung sounds are clear   CARDIOVASCULAR: Heart RRR no murmurs, rubs or gallops. No peripheral edema.   GASTROINTESTINAL: Abdomen is soft, non-tender, not distended w/ normal bowel sounds. GENITOURINARY: Bladder non tender, not distended  MUSCULOSKELETAL: No abnormal joints or musculature NEUROLOGIC:  Cranial nerves 2-12 grossly intact; quadriplegia PSYCHIATRIC: Mood and affect appropriate to situation, no behavioral issues  Patient Active Problem List   Diagnosis Date Noted  . Mental status change resolved 11/05/2017  . Hematemesis 10/29/2017  . Upper GI bleed 10/29/2017  . Erosive esophagitis 10/29/2017  . Pseudomonas urinary tract infection 10/29/2017  . Volume overload 10/29/2017  . Compression fracture of lumbar spine, non-traumatic, sequela 10/29/2017  . Cheyne-Stokes breathing 10/29/2017  . Hyperkalemia 10/26/2017  . Urinary tract infection due to Proteus 09/21/2017  . Infection of PEG site ([place]) 09/21/2017  .  Hydronephrosis with renal and ureteral calculus obstruction 09/21/2017  . Left ureteral calculus 09/21/2017  . Flexion contractures 09/21/2017  . Vitamin D deficiency 10/16/2016  . Anemia of chronic disease   . GERD (gastroesophageal reflux disease)   . Clostridium difficile colitis   . Adrenocortical insufficiency ([place])   . Cataract 06/13/2016  . Constipation 06/13/2016  . Contracture of joint of left hand 06/13/2016  . Impaired verbal communication 06/13/2016  . Nontraumatic chronic subdural hemorrhage ([place]) 06/13/2016  . Nonverbal 06/13/2016  . Wheelchair bound 06/13/2016  .  Allergic rhinitis 06/13/2016  . S/P percutaneous endoscopic gastrostomy (PEG) tube placement ([place]) 06/13/2016  . Itchy eyes 11/12/2015  . Benign neoplasm of colon 05/22/2015  . History of colon polyps 05/22/2015  . Lung nodule 05/22/2015  . Abnormal levels of other serum enzymes 05/22/2015  . Gastroesophageal reflux disease 03/17/2015  . Tinea cruris 09/10/2014  . Aspiration pneumonia ([place]) 08/15/2014  . Abscess, prostate 08/15/2014  . Pyelonephritis 08/06/2014  . Nausea & vomiting   . Fever presenting with conditions classified elsewhere 03/16/2014  . Altered mental status 03/16/2014  . Acute bronchiolitis due to unspecified organism 03/11/2014  . Enteritis due to Clostridium difficile 03/11/2014  . Type 2 diabetes mellitus without complication ([place]) 12/18/3005  . Septic shock ([place]) 01/26/2014  . Sepsis ([place]) 01/25/2014  . Acute encephalopathy 01/02/2014  . Acute respiratory failure with hypercapnia ([place]) 01/02/2014  . Chronic anemia 01/02/2014  . Subdural hematoma ([place]) 01/02/2014  . Pain in thoracic spine 12/26/2013  . PEG (percutaneous endoscopic gastrostomy) status ([place]) 10/04/2013  . Rash and nonspecific skin eruption 07/08/2013  . Thrush 07/08/2013  . Heme positive stool 07/07/2013  . Severe sepsis ([place]) 06/23/2013  . E. coli pyelonephritis 06/22/2013  . SIRS (systemic inflammatory response  syndrome) ([place]) 06/19/2013  . Leukocytosis 06/19/2013  . Bronchitis 06/19/2013  . Dysphagia 06/19/2013  . Aphasia 06/19/2013  . Other convulsions 08/10/2012  . Iron deficiency anemia 08/10/2012  . Glucocorticoid deficiency ([place]) 08/10/2012  . Febrile illness, acute 06/15/2012  . UTI (lower urinary tract infection) 06/15/2012  . Hypokalemia 11/10/2011  . Diabetic gastroparesis ([place]) 11/08/2011  . Ileus ([place]) 11/08/2011  . Hyponatremia 11/08/2011  . Adrenal insufficiency ([place]) 11/08/2011  . Quadriplegia ([place]) 11/08/2011  . Parkinson disease ([place])   . Seizures ([place])   . Depression       Labs reviewed: Basic Metabolic Panel:    Component Value Date/Time   NA 137 10/25/2017   NA 135 09/18/2017   K 4.3 10/30/2017   K 4.0 09/18/2017   CL 108 06/26/2016 1424   CO2 26 06/26/2016 1424   GLUCOSE 107 (H) 06/26/2016 1424   BUN 11 10/25/2017   CREATININE 0.5 (A) 10/25/2017   CREATININE 0.49 09/18/2017   CALCIUM 8.6 09/18/2017   PROT 8.3 (H) 06/26/2016 1424   ALBUMIN 3.6 06/26/2016 1424   AST 22 05/26/2017   ALT 15 05/26/2017   ALKPHOS 106 05/26/2017   BILITOT 0.5 06/26/2016 1424   GFRNONAA >60 06/26/2016 1424   GFRAA >90 09/18/2017    Recent Labs    06/05/17 09/18/17 10/25/17 10/30/17  NA 138 135 137  --   K 3.9 4.0 5.3 4.3  BUN 16 12 11   --   CREATININE 0.5* 0.49 0.5*  --   CALCIUM  --  8.6  --   --    Liver Function Tests: Recent Labs    05/26/17  AST 22  ALT 15  ALKPHOS 106   No results for input(s): LIPASE, AMYLASE in the last 8760 hours. No results for input(s): AMMONIA in the last 8760 hours. CBC: Recent Labs    06/05/17 09/18/17 10/25/17  WBC 7.4 7.5 9.7  HGB 11.6* 11.0* 9.8*  HCT 38* 34* 30*  PLT 200 298 331   Lipid No results for input(s): CHOL, HDL, LDLCALC, TRIG in the last 8760 hours.  Cardiac Enzymes: No results for input(s): CKTOTAL, CKMB, CKMBINDEX, TROPONINI in the last 8760 hours. BNP: No results for input(s): BNP in the last 8760  hours. Lab Results  Component  Value Date   MICROALBUR 10.4 12/01/2016   Lab Results  Component Value Date   HGBA1C 5.0 03/24/2017   Lab Results  Component Value Date   TSH 1.78 09/13/2016   Lab Results  Component Value Date   QMGNOIBB04 888 01/29/2014   No results found for: FOLATE Lab Results  Component Value Date   IRON 48 01/29/2014   TIBC 125 (L) 01/29/2014    Imaging and Procedures obtained prior to SNF admission: Dg Abd Acute W/chest  Result Date: 06/26/2016 CLINICAL DATA:  Nausea and vomiting. Parkinson disease. Gastrostomy tube feeding. Quadriplegia. EXAM: DG ABDOMEN ACUTE W/ 1V CHEST COMPARISON:  01/11/2016 chest radiograph. 11/26/2014 CT abdomen/pelvis. FINDINGS: Stable cardiomediastinal silhouette with normal heart size and aortic atherosclerosis. No pneumothorax. No pleural effusion. Slightly low lung volumes. No pulmonary edema. No acute consolidative airspace disease. Percutaneous gastrostomy tube is seen terminating in the left upper quadrant of the abdomen. No disproportionately dilated small bowel loops or significant air-fluid levels . Moderate colorectal stool volume. No evidence of pneumatosis or pneumoperitoneum. No radiopaque urolithiasis. IMPRESSION: 1. No active disease in the chest . 2. Percutaneous gastrostomy tube terminates in the left upper quadrant of the abdomen. No evidence of free intraperitoneal air. 3. Nonobstructive bowel gas pattern . 4. Moderate colorectal stool volume. Electronically Signed   By: Ilona Sorrel M.D.   On: 06/26/2016 14:57     Not all labs, radiology exams or other studies done during hospitalization come through on my EPIC note; however they are reviewed by me.    Assessment and Plan  Hematemesis- per EGD patient with mild reflux esophagitis and a small white-based GE junction erosion, no active signs of bleeding SNF -admitted for residential care continue Protonix 40 mg twice daily  Blood loss anemia SNF _hemoglobin  dropped from 13-11.7; will follow-up CBC  Adrenal insufficiency SNF -trolled; continue Cortef 20 mg per tube twice daily  Seizure disorder SNF -controlled; continue Dilantin 100 mg per tube twice daily  Gastroparesis SNF -continue Reglan 10 mg before meals and at bedtime per tube   Time spent greater than 35 minutes;> 50% of time with patient was spent reviewing records, labs, tests and studies, counseling and developing plan of care  Webb Silversmith D. [name], MD

## 2017-12-12 DIAGNOSIS — Z23 Encounter for immunization: Secondary | ICD-10-CM | POA: Diagnosis not present

## 2017-12-13 DIAGNOSIS — I69822 Dysarthria following other cerebrovascular disease: Secondary | ICD-10-CM | POA: Diagnosis not present

## 2017-12-13 DIAGNOSIS — R293 Abnormal posture: Secondary | ICD-10-CM | POA: Diagnosis not present

## 2017-12-13 DIAGNOSIS — M24531 Contracture, right wrist: Secondary | ICD-10-CM | POA: Diagnosis not present

## 2017-12-13 DIAGNOSIS — K920 Hematemesis: Secondary | ICD-10-CM | POA: Diagnosis not present

## 2017-12-13 DIAGNOSIS — M24542 Contracture, left hand: Secondary | ICD-10-CM | POA: Diagnosis not present

## 2017-12-13 DIAGNOSIS — G8250 Quadriplegia, unspecified: Secondary | ICD-10-CM | POA: Diagnosis not present

## 2017-12-14 DIAGNOSIS — M24531 Contracture, right wrist: Secondary | ICD-10-CM | POA: Diagnosis not present

## 2017-12-14 DIAGNOSIS — R293 Abnormal posture: Secondary | ICD-10-CM | POA: Diagnosis not present

## 2017-12-14 DIAGNOSIS — G8250 Quadriplegia, unspecified: Secondary | ICD-10-CM | POA: Diagnosis not present

## 2017-12-14 DIAGNOSIS — I69822 Dysarthria following other cerebrovascular disease: Secondary | ICD-10-CM | POA: Diagnosis not present

## 2017-12-14 DIAGNOSIS — K920 Hematemesis: Secondary | ICD-10-CM | POA: Diagnosis not present

## 2017-12-14 DIAGNOSIS — M24542 Contracture, left hand: Secondary | ICD-10-CM | POA: Diagnosis not present

## 2017-12-15 DIAGNOSIS — K920 Hematemesis: Secondary | ICD-10-CM | POA: Diagnosis not present

## 2017-12-15 DIAGNOSIS — M24542 Contracture, left hand: Secondary | ICD-10-CM | POA: Diagnosis not present

## 2017-12-15 DIAGNOSIS — G8250 Quadriplegia, unspecified: Secondary | ICD-10-CM | POA: Diagnosis not present

## 2017-12-15 DIAGNOSIS — M24531 Contracture, right wrist: Secondary | ICD-10-CM | POA: Diagnosis not present

## 2017-12-15 DIAGNOSIS — R293 Abnormal posture: Secondary | ICD-10-CM | POA: Diagnosis not present

## 2017-12-15 DIAGNOSIS — I69822 Dysarthria following other cerebrovascular disease: Secondary | ICD-10-CM | POA: Diagnosis not present

## 2017-12-16 DIAGNOSIS — R293 Abnormal posture: Secondary | ICD-10-CM | POA: Diagnosis not present

## 2017-12-16 DIAGNOSIS — G8250 Quadriplegia, unspecified: Secondary | ICD-10-CM | POA: Diagnosis not present

## 2017-12-16 DIAGNOSIS — M24542 Contracture, left hand: Secondary | ICD-10-CM | POA: Diagnosis not present

## 2017-12-16 DIAGNOSIS — M24531 Contracture, right wrist: Secondary | ICD-10-CM | POA: Diagnosis not present

## 2017-12-16 DIAGNOSIS — K920 Hematemesis: Secondary | ICD-10-CM | POA: Diagnosis not present

## 2017-12-16 DIAGNOSIS — I69822 Dysarthria following other cerebrovascular disease: Secondary | ICD-10-CM | POA: Diagnosis not present

## 2017-12-18 DIAGNOSIS — M24542 Contracture, left hand: Secondary | ICD-10-CM | POA: Diagnosis not present

## 2017-12-18 DIAGNOSIS — M24531 Contracture, right wrist: Secondary | ICD-10-CM | POA: Diagnosis not present

## 2017-12-18 DIAGNOSIS — I69822 Dysarthria following other cerebrovascular disease: Secondary | ICD-10-CM | POA: Diagnosis not present

## 2017-12-18 DIAGNOSIS — K920 Hematemesis: Secondary | ICD-10-CM | POA: Diagnosis not present

## 2017-12-18 DIAGNOSIS — G8250 Quadriplegia, unspecified: Secondary | ICD-10-CM | POA: Diagnosis not present

## 2017-12-18 DIAGNOSIS — R293 Abnormal posture: Secondary | ICD-10-CM | POA: Diagnosis not present

## 2017-12-19 ENCOUNTER — Encounter: Payer: Self-pay | Admitting: Internal Medicine

## 2017-12-19 DIAGNOSIS — R293 Abnormal posture: Secondary | ICD-10-CM | POA: Diagnosis not present

## 2017-12-19 DIAGNOSIS — D62 Acute posthemorrhagic anemia: Secondary | ICD-10-CM | POA: Insufficient documentation

## 2017-12-19 DIAGNOSIS — K920 Hematemesis: Secondary | ICD-10-CM | POA: Diagnosis not present

## 2017-12-19 DIAGNOSIS — I69822 Dysarthria following other cerebrovascular disease: Secondary | ICD-10-CM | POA: Diagnosis not present

## 2017-12-19 DIAGNOSIS — M24542 Contracture, left hand: Secondary | ICD-10-CM | POA: Diagnosis not present

## 2017-12-19 DIAGNOSIS — M24531 Contracture, right wrist: Secondary | ICD-10-CM | POA: Diagnosis not present

## 2017-12-19 DIAGNOSIS — G8250 Quadriplegia, unspecified: Secondary | ICD-10-CM | POA: Diagnosis not present

## 2017-12-20 DIAGNOSIS — I69822 Dysarthria following other cerebrovascular disease: Secondary | ICD-10-CM | POA: Diagnosis not present

## 2017-12-20 DIAGNOSIS — G8250 Quadriplegia, unspecified: Secondary | ICD-10-CM | POA: Diagnosis not present

## 2017-12-20 DIAGNOSIS — R293 Abnormal posture: Secondary | ICD-10-CM | POA: Diagnosis not present

## 2017-12-20 DIAGNOSIS — K920 Hematemesis: Secondary | ICD-10-CM | POA: Diagnosis not present

## 2017-12-20 DIAGNOSIS — M24542 Contracture, left hand: Secondary | ICD-10-CM | POA: Diagnosis not present

## 2017-12-20 DIAGNOSIS — M24531 Contracture, right wrist: Secondary | ICD-10-CM | POA: Diagnosis not present

## 2017-12-21 DIAGNOSIS — G8250 Quadriplegia, unspecified: Secondary | ICD-10-CM | POA: Diagnosis not present

## 2017-12-21 DIAGNOSIS — M24542 Contracture, left hand: Secondary | ICD-10-CM | POA: Diagnosis not present

## 2017-12-21 DIAGNOSIS — R293 Abnormal posture: Secondary | ICD-10-CM | POA: Diagnosis not present

## 2017-12-21 DIAGNOSIS — I69822 Dysarthria following other cerebrovascular disease: Secondary | ICD-10-CM | POA: Diagnosis not present

## 2017-12-21 DIAGNOSIS — M24531 Contracture, right wrist: Secondary | ICD-10-CM | POA: Diagnosis not present

## 2017-12-21 DIAGNOSIS — K920 Hematemesis: Secondary | ICD-10-CM | POA: Diagnosis not present

## 2017-12-22 DIAGNOSIS — K920 Hematemesis: Secondary | ICD-10-CM | POA: Diagnosis not present

## 2017-12-22 DIAGNOSIS — M24542 Contracture, left hand: Secondary | ICD-10-CM | POA: Diagnosis not present

## 2017-12-22 DIAGNOSIS — G8250 Quadriplegia, unspecified: Secondary | ICD-10-CM | POA: Diagnosis not present

## 2017-12-22 DIAGNOSIS — R293 Abnormal posture: Secondary | ICD-10-CM | POA: Diagnosis not present

## 2017-12-22 DIAGNOSIS — M24531 Contracture, right wrist: Secondary | ICD-10-CM | POA: Diagnosis not present

## 2017-12-22 DIAGNOSIS — I69822 Dysarthria following other cerebrovascular disease: Secondary | ICD-10-CM | POA: Diagnosis not present

## 2017-12-24 DIAGNOSIS — R293 Abnormal posture: Secondary | ICD-10-CM | POA: Diagnosis not present

## 2017-12-24 DIAGNOSIS — K920 Hematemesis: Secondary | ICD-10-CM | POA: Diagnosis not present

## 2017-12-24 DIAGNOSIS — I69822 Dysarthria following other cerebrovascular disease: Secondary | ICD-10-CM | POA: Diagnosis not present

## 2017-12-24 DIAGNOSIS — M24531 Contracture, right wrist: Secondary | ICD-10-CM | POA: Diagnosis not present

## 2017-12-24 DIAGNOSIS — M24542 Contracture, left hand: Secondary | ICD-10-CM | POA: Diagnosis not present

## 2017-12-24 DIAGNOSIS — G8250 Quadriplegia, unspecified: Secondary | ICD-10-CM | POA: Diagnosis not present

## 2017-12-25 DIAGNOSIS — R293 Abnormal posture: Secondary | ICD-10-CM | POA: Diagnosis not present

## 2017-12-25 DIAGNOSIS — M24542 Contracture, left hand: Secondary | ICD-10-CM | POA: Diagnosis not present

## 2017-12-25 DIAGNOSIS — M24531 Contracture, right wrist: Secondary | ICD-10-CM | POA: Diagnosis not present

## 2017-12-25 DIAGNOSIS — G8250 Quadriplegia, unspecified: Secondary | ICD-10-CM | POA: Diagnosis not present

## 2017-12-25 DIAGNOSIS — K920 Hematemesis: Secondary | ICD-10-CM | POA: Diagnosis not present

## 2017-12-25 DIAGNOSIS — I69822 Dysarthria following other cerebrovascular disease: Secondary | ICD-10-CM | POA: Diagnosis not present

## 2018-01-05 ENCOUNTER — Non-Acute Institutional Stay (SKILLED_NURSING_FACILITY): Payer: Medicare Other | Admitting: Internal Medicine

## 2018-01-05 DIAGNOSIS — E1143 Type 2 diabetes mellitus with diabetic autonomic (poly)neuropathy: Secondary | ICD-10-CM | POA: Diagnosis not present

## 2018-01-05 DIAGNOSIS — R1310 Dysphagia, unspecified: Secondary | ICD-10-CM | POA: Diagnosis not present

## 2018-01-05 DIAGNOSIS — R569 Unspecified convulsions: Secondary | ICD-10-CM | POA: Diagnosis not present

## 2018-01-05 DIAGNOSIS — K3184 Gastroparesis: Secondary | ICD-10-CM | POA: Diagnosis not present

## 2018-01-08 ENCOUNTER — Encounter: Payer: Self-pay | Admitting: Internal Medicine

## 2018-01-08 NOTE — Progress Notes (Signed)
Location:  [facility] Room Number: 214D Place of Service:  SNF [phone])  [name] Delaine. [name], MD  Patient Care Team: [name], MD as PCP - General (Internal Medicine)  Extended Emergency Contact Information Primary Emergency Contact: [name],[name] Address: [address]          [place], [place] [address] [place] Phone: [phone] Mobile Phone: [phone] Relation: Mother Secondary Emergency Contact: Constance Haw [place] Phone: [phone] Relation: Brother Father: [name], [name] Mobile Phone: [phone]    Allergies: Aspirin; Keflex [cephalexin]; Nsaids; and Penicillins  Chief Complaint  Patient presents with  . Medical Management of Chronic Issues    Routine Visit    HPI: Patient is 62 y.o. male who is being seen for routine issues of dysphasia, diabetic gastroparesis, and seizures.  Past Medical History:  Diagnosis Date  . Acute encephalopathy 01/02/2014  . Adrenal insufficiency ([place]) 11/08/2011  . Adrenocortical insufficiency ([place])   . Allergic rhinitis   . Anemia    Iron deficient  . Aphasia 06/19/2013  . Cataract   . Clostridium difficile colitis 02/2014  . Constipation   . Contracture of joint of left hand   . Depression   . Diabetic gastroparesis ([place]) 11/08/2011  . Dysphagia 06/19/2013  . Gastroesophageal reflux disease 03/17/2015  . GERD (gastroesophageal reflux disease)   . Glucocorticoid deficiency ([place]) 08/10/2012  . History of colon polyps 05/22/2015  . Ileus ([place]) 11/08/2011  . Impaired verbal communication   . Iron deficiency anemia 08/10/2012  . Lung nodule 05/22/2015  . Nontraumatic chronic subdural hemorrhage ([place])   . Parkinson disease ([name])   . Parkinson's disease ([name])   . PEG (percutaneous endoscopic gastrostomy) status ([place]) 10/04/2013  . Pneumonia 07/2014   aspiration pneumonia  . Pyelonephritis   . Quadriplegia ([place])     . S/P percutaneous endoscopic gastrostomy (PEG) tube placement ([place])   . Seizures ([place])    last occurance 2015  . SIRS (systemic inflammatory response syndrome) ([name]) 08/06/2014  . Subdural hematoma ([name]) 01/02/2014  . Type 2 diabetes mellitus without complication ([name]) 76/81/1572  . UTI (urinary tract infection) 07/2014  . Vitamin D deficiency 10/16/2016  . Wheelchair bound     Past Surgical History:  Procedure Laterality Date  . COLONOSCOPY    . ESOPHAGOGASTRODUODENOSCOPY    . PEG TUBE PLACEMENT    . PERIPHERALLY INSERTED CENTRAL CATHETER INSERTION    . TONSILLECTOMY  1962    Allergies as of 01/05/2018      Reactions   Aspirin Other (See Comments)   Unknown reaction; "blood doesn't clog too well" per mother.    Keflex [cephalexin]    Rash   Nsaids Other (See Comments)   Unknown reaction   Penicillins Hives, Other (See Comments)   Unknown reaction.  Pt has tolerated cephalosporins in the past.      Medication List        Accurate as of 01/05/18 11:59 PM. Always use your most recent med list.          acetaminophen 325 MG tablet Commonly known as:  TYLENOL Place 650 mg into feeding tube every 6 (six) hours as needed. Fever > 101   amantadine 50 MG/5ML solution Commonly known as:  SYMMETREL Take 100 mg by mouth. GIVE 10 CC (100 MG) BY PEG BID   Baclofen 5 MG Tabs Take by mouth. Give 1 tablet by Per Gtube route 4  times daily   bisacodyl 10 MG suppository Commonly known as:  DULCOLAX Place 10 mg rectally daily as needed for moderate constipation. If constipation not relieved by milk of magnesia give one 10 mg suppository in 24hours   CENTAMIN Liqd 5 mLs by PEG Tube route daily.   CETAPHIL GENTLE CLEANSER Liqd Apply to face and neck with water and then pat dry daily.   Cholecalciferol 5000 UNIT/ML Liqd Take by mouth. 10 mls by Per G Tube route once a week. Wednesdays   DILANTIN 125 MG/5ML suspension Generic drug:  phenytoin Place 100 mg into feeding tube  2 (two) times daily.   feeding supplement (JEVITY 1.5 CAL) Liqd Place into feeding tube. Initiate Jevity 1.5 cal @@ 58 ml/hr continuous x 20 hr via peg ( hold 1 hr pre/post phenton administration)   ferrous sulfate 220 (44 Fe) MG/5ML solution Take 220 mg by mouth. 7.5 ML VIA TUBE ONCE DAILY FOR ANEMIA   folic acid 1 MG tablet Commonly known as:  FOLVITE 1 mg by PEG Tube route daily.   hydrocortisone 20 MG tablet Commonly known as:  CORTEF Take 20 mg by mouth. Give 1 tablet via per tube twice daily   loratadine 10 MG tablet Commonly known as:  CLARITIN 10 mg by PEG Tube route daily.   Magnesium 400 MG Tabs Take by mouth. Give 1 tablet by Per Gtube twice daily for supplement   magnesium hydroxide 400 MG/5ML suspension Commonly known as:  MILK OF MAGNESIA Take 30 mLs by mouth daily as needed for mild constipation.   metoCLOPramide 10 MG tablet Commonly known as:  REGLAN Place 1 tablet (10 mg total) into feeding tube 4 (four) times daily -  before meals and at bedtime. Via Peg tube   olopatadine 0.1 % ophthalmic solution Commonly known as:  PATANOL Place 1 drop into both eyes every morning.   pantoprazole sodium 40 mg/20 mL Pack Commonly known as:  PROTONIX Take by mouth 2 (two) times daily.   polyethylene glycol packet Commonly known as:  MIRALAX / GLYCOLAX Place 17 g into feeding tube daily as needed.   polyvinyl alcohol 1.4 % ophthalmic solution Commonly known as:  LIQUIFILM TEARS Place 1 drop into both eyes daily at 6 (six) AM.   promethazine 25 MG tablet Commonly known as:  PHENERGAN Place 25 mg into feeding tube every 6 (six) hours as needed for nausea or vomiting.   RA SALINE ENEMA 19-7 GM/118ML Enem Place 1 each rectally as needed (for constipation).   simethicone 40 MG/0.6ML drops Commonly known as:  MYLICON Take 40 mg by mouth. Give 1.2 mls ( 80 mg total ) by G-tube twice daily   sterile water for irrigation Flush 210ml of water into peg tub every 4  hours       No orders of the defined types were placed in this encounter.   Immunization History  Administered Date(s) Administered  . Influenza Whole 12/31/2012  . Influenza-Unspecified 12/29/2011, 12/31/2012, 12/22/2014, 01/22/2016, 01/05/2017  . PPD Test 08/22/2008  . Pneumococcal-Unspecified 06/23/2010, 06/23/2015    Social History   Tobacco Use  . Smoking status: Never Smoker  . Smokeless tobacco: Never Used  Substance Use Topics  . Alcohol use: No    Review of Systems  DATA OBTAINED: from patient communicates with the nurse who communicates with me while we are all 3 together GENERAL:  no fevers, fatigue, appetite changes SKIN: No itching, rash HEENT: No complaint RESPIRATORY: No cough, wheezing, SOB CARDIAC: No chest  pain, palpitations, lower extremity edema  GI: No abdominal pain, No N/V/D or constipation, No heartburn or reflux  GU: No dysuria, frequency or urgency, or incontinence  MUSCULOSKELETAL: No unrelieved bone/joint pain NEUROLOGIC: No headache, dizziness  PSYCHIATRIC: No overt anxiety or sadness  Vitals:   01/08/18 1116  BP: 123/62  Pulse: 77  Resp: 18  Temp: (!) 97.1 F (36.2 C)   Body mass index is 23.79 kg/m. Physical Exam  GENERAL APPEARANCE: Alert, non-conversant, No acute distress, very pleasant SKIN: No diaphoresis rash HEENT: Unremarkable RESPIRATORY: Breathing is even, unlabored. Lung sounds are clear   CARDIOVASCULAR: Heart RRR no murmurs, rubs or gallops. No peripheral edema  GASTROINTESTINAL: Abdomen is soft, non-tender, not distended w/ normal bowel sounds.  GENITOURINARY: Bladder non tender, not distended  MUSCULOSKELETAL: Wasting and contractures NEUROLOGIC: Cranial nerves 2-12 grossly intact.  External quadriplegia, patient has some movement of his upper extremities PSYCHIATRIC: Mood and affect appropriate to situation, no behavioral issues  Patient Active Problem List   Diagnosis Date Noted  . Acute blood loss anemia  12/19/2017  . Mental status change resolved 11/05/2017  . Hematemesis 10/29/2017  . Upper GI bleed 10/29/2017  . Erosive esophagitis 10/29/2017  . Pseudomonas urinary tract infection 10/29/2017  . Volume overload 10/29/2017  . Compression fracture of lumbar spine, non-traumatic, sequela 10/29/2017  . Cheyne-Stokes breathing 10/29/2017  . Hyperkalemia 10/26/2017  . Urinary tract infection due to Proteus 09/21/2017  . Infection of PEG site ([place]) 09/21/2017  . Hydronephrosis with renal and ureteral calculus obstruction 09/21/2017  . Left ureteral calculus 09/21/2017  . Flexion contractures 09/21/2017  . Vitamin D deficiency 10/16/2016  . Anemia of chronic disease   . GERD (gastroesophageal reflux disease)   . Clostridium difficile colitis   . Adrenocortical insufficiency ([place])   . Cataract 06/13/2016  . Constipation 06/13/2016  . Contracture of joint of left hand 06/13/2016  . Impaired verbal communication 06/13/2016  . Nontraumatic chronic subdural hemorrhage ([place]) 06/13/2016  . Nonverbal 06/13/2016  . Wheelchair bound 06/13/2016  . Allergic rhinitis 06/13/2016  . S/P percutaneous endoscopic gastrostomy (PEG) tube placement ([place]) 06/13/2016  . Itchy eyes 11/12/2015  . Benign neoplasm of colon 05/22/2015  . History of colon polyps 05/22/2015  . Lung nodule 05/22/2015  . Abnormal levels of other serum enzymes 05/22/2015  . Gastroesophageal reflux disease 03/17/2015  . Tinea cruris 09/10/2014  . Aspiration pneumonia ([place]) 08/15/2014  . Abscess, prostate 08/15/2014  . Pyelonephritis 08/06/2014  . Nausea & vomiting   . Fever presenting with conditions classified elsewhere 03/16/2014  . Altered mental status 03/16/2014  . Acute bronchiolitis due to unspecified organism 03/11/2014  . Enteritis due to Clostridium difficile 03/11/2014  . Type 2 diabetes mellitus without complication ([place]) 76/73/4193  . Septic shock ([place]) 01/26/2014  . Sepsis ([place]) 01/25/2014  . Acute  encephalopathy 01/02/2014  . Acute respiratory failure with hypercapnia ([place]) 01/02/2014  . Chronic anemia 01/02/2014  . Subdural hematoma ([place]) 01/02/2014  . Pain in thoracic spine 12/26/2013  . PEG (percutaneous endoscopic gastrostomy) status ([place]) 10/04/2013  . Rash and nonspecific skin eruption 07/08/2013  . Thrush 07/08/2013  . Heme positive stool 07/07/2013  . Severe sepsis ([place]) 06/23/2013  . E. coli pyelonephritis 06/22/2013  . SIRS (systemic inflammatory response syndrome) ([place]) 06/19/2013  . Leukocytosis 06/19/2013  . Bronchitis 06/19/2013  . Dysphagia 06/19/2013  . Aphasia 06/19/2013  . Other convulsions 08/10/2012  . Iron deficiency anemia 08/10/2012  . Glucocorticoid deficiency ([place]) 08/10/2012  . Febrile illness, acute 06/15/2012  .  UTI (lower urinary tract infection) 06/15/2012  . Hypokalemia 11/10/2011  . Diabetic gastroparesis ([place]) 11/08/2011  . Ileus ([place]) 11/08/2011  . Hyponatremia 11/08/2011  . Adrenal insufficiency ([place]) 11/08/2011  . Quadriplegia ([place]) 11/08/2011  . Parkinson disease ([place])   . Seizures ([place])   . Depression     CMP     Component Value Date/Time   NA 137 10/25/2017   NA 135 09/18/2017   K 4.3 10/30/2017   K 4.0 09/18/2017   CL 108 06/26/2016 1424   CO2 26 06/26/2016 1424   GLUCOSE 107 (H) 06/26/2016 1424   BUN 11 10/25/2017   CREATININE 0.5 (A) 10/25/2017   CREATININE 0.49 09/18/2017   CALCIUM 8.6 09/18/2017   PROT 8.3 (H) 06/26/2016 1424   ALBUMIN 3.6 06/26/2016 1424   AST 22 05/26/2017   ALT 15 05/26/2017   ALKPHOS 106 05/26/2017   BILITOT 0.5 06/26/2016 1424   GFRNONAA >60 06/26/2016 1424   GFRAA >90 09/18/2017   Recent Labs    06/05/17 09/18/17 10/25/17 10/30/17  NA 138 135 137  --   K 3.9 4.0 5.3 4.3  BUN 16 12 11   --   CREATININE 0.5* 0.49 0.5*  --   CALCIUM  --  8.6  --   --    Recent Labs    05/26/17  AST 22  ALT 15  ALKPHOS 106   Recent Labs    06/05/17 09/18/17 10/25/17  WBC 7.4 7.5 9.7  HGB 11.6*  11.0* 9.8*  HCT 38* 34* 30*  PLT 200 298 331   No results for input(s): CHOL, LDLCALC, TRIG in the last 8760 hours.  Invalid input(s): HCL Lab Results  Component Value Date   MICROALBUR 10.4 12/01/2016   Lab Results  Component Value Date   TSH 1.78 09/13/2016   Lab Results  Component Value Date   HGBA1C 5.0 03/24/2017   Lab Results  Component Value Date   CHOL 127 09/13/2016   HDL 55 09/13/2016   LDLCALC 70 09/13/2016   TRIG 79 09/13/2016    Significant Diagnostic Results in last 30 days:  No results found.  Assessment and Plan  Dysphagia Chronic and permanent; no current problems with feeding tube  Diabetic gastroparesis Very few problems with vomiting or reflux; continue Reglan 10 mg with every meal and at bedtime per tube  Seizures Continues without seizures; continue with Dilantin 100 mg per tube twice daily     Dalia Jollie D. [name], MD

## 2018-01-26 ENCOUNTER — Encounter: Payer: Self-pay | Admitting: Internal Medicine

## 2018-01-26 NOTE — Assessment & Plan Note (Signed)
Very few problems with vomiting or reflux; continue Reglan 10 mg with every meal and at bedtime per tube

## 2018-01-26 NOTE — Assessment & Plan Note (Signed)
Continues without seizures; continue with Dilantin 100 mg per tube twice daily

## 2018-01-26 NOTE — Assessment & Plan Note (Signed)
Chronic and permanent; no current problems with feeding tube

## 2018-02-09 DIAGNOSIS — R0682 Tachypnea, not elsewhere classified: Secondary | ICD-10-CM | POA: Diagnosis not present

## 2018-02-12 ENCOUNTER — Non-Acute Institutional Stay (SKILLED_NURSING_FACILITY): Payer: Medicare Other | Admitting: Internal Medicine

## 2018-02-12 ENCOUNTER — Encounter: Payer: Self-pay | Admitting: Internal Medicine

## 2018-02-12 DIAGNOSIS — E2740 Unspecified adrenocortical insufficiency: Secondary | ICD-10-CM

## 2018-02-12 DIAGNOSIS — E559 Vitamin D deficiency, unspecified: Secondary | ICD-10-CM

## 2018-02-12 DIAGNOSIS — K219 Gastro-esophageal reflux disease without esophagitis: Secondary | ICD-10-CM | POA: Diagnosis not present

## 2018-02-12 NOTE — Progress Notes (Signed)
Location:  [facility] Room Number: 214D Place of Service:  [phone])  [name]. [name] Coil, MD  Patient Care Team: [name], MD as PCP - General (Internal Medicine)  Extended Emergency Contact Information Primary Emergency Contact: Poorman,[name] Address: [address]          [place], [place] [address] [name] Phone: [phone] Mobile Phone: [phone] Relation: Mother Secondary Emergency Contact: Constance Haw [place] Phone: [phone] Relation: Brother Father: Baldomero, Mirarchi [place] Mobile Phone: [phone]    Allergies: Aspirin; Keflex [cephalexin]; Nsaids; and Penicillins  Chief Complaint  Patient presents with  . Medical Management of Chronic Issues    Routine Visit  . Health Maintenance    Eye exam, influenza vacc. and hemoglobin A1C    HPI: Patient is 62 y.o. male who is being seen for routine issues of vitamin D deficiency, adrenocortical deficiency and GERD.  Past Medical History:  Diagnosis Date  . Acute encephalopathy 01/02/2014  . Adrenal insufficiency ([place]) 11/08/2011  . Adrenocortical insufficiency ([place])   . Allergic rhinitis   . Anemia    Iron deficient  . Aphasia 06/19/2013  . Cataract   . Clostridium difficile colitis 02/2014  . Constipation   . Contracture of joint of left hand   . Depression   . Diabetic gastroparesis ([place]) 11/08/2011  . Dysphagia 06/19/2013  . Gastroesophageal reflux disease 03/17/2015  . GERD (gastroesophageal reflux disease)   . Glucocorticoid deficiency ([place]) 08/10/2012  . History of colon polyps 05/22/2015  . Ileus ([place]) 11/08/2011  . Impaired verbal communication   . Iron deficiency anemia 08/10/2012  . Lung nodule 05/22/2015  . Nontraumatic chronic subdural hemorrhage ([place])   . Parkinson disease ([place])   . Parkinson's disease ([place])   . PEG (percutaneous endoscopic gastrostomy) status ([place]) 10/04/2013  .  Pneumonia 07/2014   aspiration pneumonia  . Pyelonephritis   . Quadriplegia ([place])   . S/P percutaneous endoscopic gastrostomy (PEG) tube placement ([place])   . Seizures ([place])    last occurance 2015  . SIRS (systemic inflammatory response syndrome) ([place]) 08/06/2014  . Subdural hematoma ([place]) 01/02/2014  . Type 2 diabetes mellitus without complication ([place]) 41/28/7867  . UTI (urinary tract infection) 07/2014  . Vitamin D deficiency 10/16/2016  . Wheelchair bound     Past Surgical History:  Procedure Laterality Date  . COLONOSCOPY    . ESOPHAGOGASTRODUODENOSCOPY    . PEG TUBE PLACEMENT    . PERIPHERALLY INSERTED CENTRAL CATHETER INSERTION    . TONSILLECTOMY  1962    Allergies as of 02/12/2018      Reactions   Aspirin Other (See Comments)   Unknown reaction; "blood doesn't clog too well" per mother.    Keflex [cephalexin]    Rash   Nsaids Other (See Comments)   Unknown reaction   Penicillins Hives, Other (See Comments)   Unknown reaction.  Pt has tolerated cephalosporins in the past.      Medication List        Accurate as of 02/12/18 11:59 PM. Always use your most recent med list.          acetaminophen 325 MG tablet Commonly known as:  TYLENOL Place 650 mg into feeding tube every 6 (six) hours as needed. Fever > 101   amantadine 50 MG/5ML solution Commonly known as:  SYMMETREL Take 100 mg by mouth. GIVE 10 CC (100 MG) BY PEG BID  Baclofen 5 MG Tabs Take by mouth. Give 1 tablet by Per Gtube route 4 times daily   bisacodyl 10 MG suppository Commonly known as:  DULCOLAX Place 10 mg rectally daily as needed for moderate constipation. If constipation not relieved by milk of magnesia give one 10 mg suppository in 24hours   CENTAMIN Liqd 5 mLs by PEG Tube route daily.   CETAPHIL GENTLE CLEANSER Liqd Apply to face and neck with water and then pat dry daily.   Cholecalciferol 5000 UNIT/ML Liqd Take by mouth. 10 mls by Per G Tube route once a week. Wednesdays     DILANTIN 125 MG/5ML suspension Generic drug:  phenytoin Place 100 mg into feeding tube 2 (two) times daily.   feeding supplement (JEVITY 1.5 CAL) Liqd Place into feeding tube. Initiate Jevity 1.5 cal @@ 58 ml/hr continuous x 20 hr via peg ( hold 1 hr pre/post phenton administration)   ferrous sulfate 220 (44 Fe) MG/5ML solution Take 220 mg by mouth. 7.5 ML VIA TUBE ONCE DAILY FOR ANEMIA   folic acid 1 MG tablet Commonly known as:  FOLVITE 1 mg by PEG Tube route daily.   guaifenesin 100 MG/5ML syrup Commonly known as:  ROBITUSSIN 400 mg. administer 400mg  = 20 ml per peg every 6 hours for cough x 1 week   hydrocortisone 20 MG tablet Commonly known as:  CORTEF Take 20 mg by mouth. Give 1 tablet via per tube twice daily   LEVAQUIN 500 MG tablet Generic drug:  levofloxacin 500 mg. take 1 tab via peg daily x 7 days for pneumonia   loratadine 10 MG tablet Commonly known as:  CLARITIN 10 mg by PEG Tube route daily.   Magnesium 400 MG Tabs Take by mouth. Give 1 tablet by Per Gtube twice daily for supplement   magnesium hydroxide 400 MG/5ML suspension Commonly known as:  MILK OF MAGNESIA Take 30 mLs by mouth daily as needed for mild constipation.   metoCLOPramide 10 MG tablet Commonly known as:  REGLAN Take 15 mg by mouth. TAKE 1 TABLET VIA TUBE BEFORE MEALS AND AT BEDTIME   olopatadine 0.1 % ophthalmic solution Commonly known as:  PATANOL Place 1 drop into both eyes every morning.   pantoprazole sodium 40 mg/20 mL Pack Commonly known as:  PROTONIX Take by mouth 2 (two) times daily.   polyethylene glycol packet Commonly known as:  MIRALAX / GLYCOLAX Place 17 g into feeding tube daily as needed.   polyvinyl alcohol 1.4 % ophthalmic solution Commonly known as:  LIQUIFILM TEARS Place 1 drop into both eyes daily at 6 (six) AM.   promethazine 25 MG tablet Commonly known as:  PHENERGAN Place 25 mg into feeding tube every 6 (six) hours as needed for nausea or vomiting.    RA SALINE ENEMA 19-7 GM/118ML Enem Place 1 each rectally as needed (for constipation).   simethicone 40 MG/0.6ML drops Commonly known as:  MYLICON Take 40 mg by mouth. Give 1.2 mls ( 80 mg total ) by G-tube twice daily   sterile water for irrigation Flush 293ml of water into peg tub every 4 hours       No orders of the defined types were placed in this encounter.   Immunization History  Administered Date(s) Administered  . Influenza Whole 12/31/2012  . Influenza-Unspecified 12/29/2011, 12/31/2012, 12/22/2014, 01/22/2016, 01/05/2017  . PPD Test 08/22/2008  . Pneumococcal-Unspecified 06/23/2010, 06/23/2015    Social History   Tobacco Use  . Smoking status: Never Smoker  . Smokeless  tobacco: Never Used  Substance Use Topics  . Alcohol use: No    Review of Systems  DATA OBTAINED: from nurse GENERAL:  no fevers, fatigue, appetite changes SKIN: No itching, rash HEENT: No complaint RESPIRATORY: No cough, wheezing, SOB CARDIAC: No chest pain, palpitations, lower extremity edema  GI: No abdominal pain, No N/V/D or constipation, No heartburn or reflux  GU: No dysuria, frequency or urgency, or incontinence  MUSCULOSKELETAL: No unrelieved bone/joint pain NEUROLOGIC: No headache, dizziness  PSYCHIATRIC: No overt anxiety or sadness  Vitals:   02/12/18 1004  BP: 125/71  Pulse: 74  Resp: 18  Temp: 98 F (36.7 C)   Body mass index is 23.35 kg/m. Physical Exam  GENERAL APPEARANCE: Alert, non-conversant, communicates with a board and written words; no acute distress  SKIN: No diaphoresis rash HEENT: Unremarkable RESPIRATORY: Breathing is even, unlabored. Lung sounds are clear   CARDIOVASCULAR: Heart RRR no murmurs, rubs or gallops. No peripheral edema  GASTROINTESTINAL: Abdomen is soft, non-tender, not distended w/ normal bowel sounds.  GENITOURINARY: Bladder non tender, not distended  MUSCULOSKELETAL: Wasting and contracture NEUROLOGIC: Cranial nerves 2-12 grossly  intact: Functional quadriplegia and that patient has some movement of upper extremities PSYCHIATRIC: Mood and affect appropriate to situation, no behavioral issues  Patient Active Problem List   Diagnosis Date Noted  . Acute blood loss anemia 12/19/2017  . Mental status change resolved 11/05/2017  . Hematemesis 10/29/2017  . Upper GI bleed 10/29/2017  . Erosive esophagitis 10/29/2017  . Pseudomonas urinary tract infection 10/29/2017  . Volume overload 10/29/2017  . Compression fracture of lumbar spine, non-traumatic, sequela 10/29/2017  . Cheyne-Stokes breathing 10/29/2017  . Hyperkalemia 10/26/2017  . Urinary tract infection due to Proteus 09/21/2017  . Infection of PEG site ([place]) 09/21/2017  . Hydronephrosis with renal and ureteral calculus obstruction 09/21/2017  . Left ureteral calculus 09/21/2017  . Flexion contractures 09/21/2017  . Vitamin D deficiency 10/16/2016  . Anemia of chronic disease   . GERD (gastroesophageal reflux disease)   . Clostridium difficile colitis   . Adrenocortical insufficiency ([place])   . Cataract 06/13/2016  . Constipation 06/13/2016  . Contracture of joint of left hand 06/13/2016  . Impaired verbal communication 06/13/2016  . Nontraumatic chronic subdural hemorrhage ([place]) 06/13/2016  . Nonverbal 06/13/2016  . Wheelchair bound 06/13/2016  . Allergic rhinitis 06/13/2016  . S/P percutaneous endoscopic gastrostomy (PEG) tube placement ([place]) 06/13/2016  . Itchy eyes 11/12/2015  . Benign neoplasm of colon 05/22/2015  . History of colon polyps 05/22/2015  . Lung nodule 05/22/2015  . Abnormal levels of other serum enzymes 05/22/2015  . Gastroesophageal reflux disease 03/17/2015  . Tinea cruris 09/10/2014  . Aspiration pneumonia ([place]) 08/15/2014  . Abscess, prostate 08/15/2014  . Pyelonephritis 08/06/2014  . Nausea & vomiting   . Fever presenting with conditions classified elsewhere 03/16/2014  . Altered mental status 03/16/2014  . Acute  bronchiolitis due to unspecified organism 03/11/2014  . Enteritis due to Clostridium difficile 03/11/2014  . Type 2 diabetes mellitus without complication ([place]) 99/24/2683  . Septic shock ([place]) 01/26/2014  . Sepsis ([place]) 01/25/2014  . Acute encephalopathy 01/02/2014  . Acute respiratory failure with hypercapnia ([place]) 01/02/2014  . Chronic anemia 01/02/2014  . Subdural hematoma ([place]) 01/02/2014  . Pain in thoracic spine 12/26/2013  . PEG (percutaneous endoscopic gastrostomy) status ([place]) 10/04/2013  . Rash and nonspecific skin eruption 07/08/2013  . Thrush 07/08/2013  . Heme positive stool 07/07/2013  . Severe sepsis ([place]) 06/23/2013  . E. coli  pyelonephritis 06/22/2013  . SIRS (systemic inflammatory response syndrome) ([place]) 06/19/2013  . Leukocytosis 06/19/2013  . Bronchitis 06/19/2013  . Dysphagia 06/19/2013  . Aphasia 06/19/2013  . Other convulsions 08/10/2012  . Iron deficiency anemia 08/10/2012  . Glucocorticoid deficiency ([place]) 08/10/2012  . Febrile illness, acute 06/15/2012  . UTI (lower urinary tract infection) 06/15/2012  . Hypokalemia 11/10/2011  . Diabetic gastroparesis ([place]) 11/08/2011  . Ileus ([place]) 11/08/2011  . Hyponatremia 11/08/2011  . Adrenal insufficiency ([place]) 11/08/2011  . Quadriplegia ([place]) 11/08/2011  . Parkinson disease ([place])   . Seizures ([place])   . Depression     CMP     Component Value Date/Time   NA 137 10/25/2017   NA 135 09/18/2017   K 4.3 10/30/2017   K 4.0 09/18/2017   CL 108 06/26/2016 1424   CO2 26 06/26/2016 1424   GLUCOSE 107 (H) 06/26/2016 1424   BUN 11 10/25/2017   CREATININE 0.5 (A) 10/25/2017   CREATININE 0.49 09/18/2017   CALCIUM 8.6 09/18/2017   PROT 8.3 (H) 06/26/2016 1424   ALBUMIN 3.6 06/26/2016 1424   AST 22 05/26/2017   ALT 15 05/26/2017   ALKPHOS 106 05/26/2017   BILITOT 0.5 06/26/2016 1424   GFRNONAA >60 06/26/2016 1424   GFRAA >90 09/18/2017   Recent Labs    06/05/17 09/18/17 10/25/17 10/30/17  NA 138 135 137   --   K 3.9 4.0 5.3 4.3  BUN 16 12 11   --   CREATININE 0.5* 0.49 0.5*  --   CALCIUM  --  8.6  --   --    Recent Labs    05/26/17  AST 22  ALT 15  ALKPHOS 106   Recent Labs    06/05/17 09/18/17 10/25/17  WBC 7.4 7.5 9.7  HGB 11.6* 11.0* 9.8*  HCT 38* 34* 30*  PLT 200 298 331   No results for input(s): CHOL, LDLCALC, TRIG in the last 8760 hours.  Invalid input(s): HCL Lab Results  Component Value Date   MICROALBUR 10.4 12/01/2016   Lab Results  Component Value Date   TSH 1.78 09/13/2016   Lab Results  Component Value Date   HGBA1C 5.0 03/24/2017   Lab Results  Component Value Date   CHOL 127 09/13/2016   HDL 55 09/13/2016   LDLCALC 70 09/13/2016   TRIG 79 09/13/2016    Significant Diagnostic Results in last 30 days:  No results found.  Assessment and Plan  Vitamin D deficiency Vitamin D still low; continue 50,000 units per tube weekly  Adrenocortical insufficiency (HCC) Stable and chronic; continue prednisone 6 mg every morning and 4 mg q. Afternoon       Gastroesophageal reflux disease No reported problems recently; continue Protonix 40 mg per tube twice daily and Reglan 50 mils before meals and nightly    [name] D. [name] Coil, MD

## 2018-02-17 ENCOUNTER — Encounter: Payer: Self-pay | Admitting: Internal Medicine

## 2018-02-17 NOTE — Assessment & Plan Note (Signed)
Vitamin D still low; continue 50,000 units per tube weekly

## 2018-02-17 NOTE — Assessment & Plan Note (Signed)
Stable and chronic; continue prednisone 6 mg every morning and 4 mg q. Afternoon

## 2018-02-17 NOTE — Assessment & Plan Note (Signed)
No reported problems recently; continue Protonix 40 mg per tube twice daily and Reglan 50 mils before meals and nightly

## 2018-03-12 ENCOUNTER — Non-Acute Institutional Stay (SKILLED_NURSING_FACILITY): Payer: Medicare Other | Admitting: Internal Medicine

## 2018-03-12 ENCOUNTER — Encounter: Payer: Self-pay | Admitting: Internal Medicine

## 2018-03-12 DIAGNOSIS — R4701 Aphasia: Secondary | ICD-10-CM

## 2018-03-12 DIAGNOSIS — E119 Type 2 diabetes mellitus without complications: Secondary | ICD-10-CM | POA: Diagnosis not present

## 2018-03-12 DIAGNOSIS — G20 Parkinson's disease: Secondary | ICD-10-CM | POA: Diagnosis not present

## 2018-03-12 NOTE — Progress Notes (Signed)
Location:  [facility] Room Number: 214D Place of Service:  SNF [phone])   [name]. [name], MD  Patient Care Team: [name], MD as PCP - General (Internal Medicine)  Extended Emergency Contact Information Primary Emergency Contact: Smedberg,[name] Address: [address]          [place], [place] [address] [name] Phone: [phone] Mobile Phone: [phone] Relation: Mother Secondary Emergency Contact: Constance Haw [place] Phone: [phone] Relation: Brother Father: Se[place], Strad[place] Mobile Phone: [phone]    Allergies: Aspirin; Keflex [cephalexin]; Nsaids; and Penicillins  Chief Complaint  Patient presents with  . Medical Management of Chronic Issues    Routine Visit    HPI: Patient is 62 y.o. male who being seen for routine issues of diabetes mellitus type 2, Parkinson's disease, and aphasia.  Past Medical History:  Diagnosis Date  . Acute encephalopathy 01/02/2014  . Adrenal insufficiency ([place]) 11/08/2011  . Adrenocortical insufficiency ([place])   . Allergic rhinitis   . Anemia    Iron deficient  . Aphasia 06/19/2013  . Cataract   . Clostridium difficile colitis 02/2014  . Constipation   . Contracture of joint of left hand   . Depression   . Diabetic gastroparesis ([place]) 11/08/2011  . Dysphagia 06/19/2013  . Gastroesophageal reflux disease 03/17/2015  . GERD (gastroesophageal reflux disease)   . Glucocorticoid deficiency ([place]) 08/10/2012  . History of colon polyps 05/22/2015  . Ileus ([place]) 11/08/2011  . Impaired verbal communication   . Iron deficiency anemia 08/10/2012  . Lung nodule 05/22/2015  . Nontraumatic chronic subdural hemorrhage ([place])   . Parkinson disease ([place])   . Parkinson's disease ([place])   . PEG (percutaneous endoscopic gastrostomy) status ([place]) 10/04/2013  . Pneumonia 07/2014   aspiration pneumonia  . Pyelonephritis   .  Quadriplegia ([place])   . S/P percutaneous endoscopic gastrostomy (PEG) tube placement ([place])   . Seizures ([place])    last occurance 2015  . SIRS (systemic inflammatory response syndrome) ([place]) 08/06/2014  . Subdural hematoma ([place]) 01/02/2014  . Type 2 diabetes mellitus without complication ([place]) 48/54/6270  . UTI (urinary tract infection) 07/2014  . Vitamin D deficiency 10/16/2016  . Wheelchair bound     Past Surgical History:  Procedure Laterality Date  . COLONOSCOPY    . ESOPHAGOGASTRODUODENOSCOPY    . PEG TUBE PLACEMENT    . PERIPHERALLY INSERTED CENTRAL CATHETER INSERTION    . TONSILLECTOMY  1962    Allergies as of 03/12/2018      Reactions   Aspirin Other (See Comments)   Unknown reaction; "blood doesn't clog too well" per mother.    Keflex [cephalexin]    Rash   Nsaids Other (See Comments)   Unknown reaction   Penicillins Hives, Other (See Comments)   Unknown reaction.  Pt has tolerated cephalosporins in the past.      Medication List       Accurate as of March 12, 2018 11:59 PM. Always use your most recent med list.        acetaminophen 325 MG tablet Commonly known as:  TYLENOL Place 650 mg into feeding tube every 6 (six) hours as needed. Fever > 101   amantadine 50 MG/5ML solution Commonly known as:  SYMMETREL Take 100 mg by mouth. GIVE 10 CC (100 MG) BY PEG BID   Baclofen 5 MG Tabs Take by mouth. Give 1 tablet by Per Gtube route  4 times daily   bisacodyl 10 MG suppository Commonly known as:  DULCOLAX Place 10 mg rectally daily as needed for moderate constipation. If constipation not relieved by milk of magnesia give one 10 mg suppository in 24hours   CENTAMIN Liqd 5 mLs by PEG Tube route daily.   CETAPHIL GENTLE CLEANSER Liqd Apply to face and neck with water and then pat dry daily.   Cholecalciferol 5000 UNIT/ML Liqd Take by mouth. 10 mls by Per G Tube route once a week. Wednesdays   DILANTIN 125 MG/5ML suspension Generic drug:  phenytoin Place  100 mg into feeding tube 2 (two) times daily.   feeding supplement (JEVITY 1.5 CAL) Liqd Place into feeding tube. Initiate Jevity 1.5 cal @@ 58 ml/hr continuous x 20 hr via peg ( hold 1 hr pre/post phenton administration)   ferrous sulfate 220 (44 Fe) MG/5ML solution Take 220 mg by mouth. 7.5 ML VIA TUBE ONCE DAILY FOR ANEMIA   folic acid 1 MG tablet Commonly known as:  FOLVITE 1 mg by PEG Tube route daily.   hydrocortisone 20 MG tablet Commonly known as:  CORTEF Take 20 mg by mouth. Give 1 tablet via per tube twice daily   loratadine 10 MG tablet Commonly known as:  CLARITIN 10 mg by PEG Tube route daily.   Magnesium 400 MG Tabs Take by mouth. Give 1 tablet by Per Gtube twice daily for supplement   magnesium hydroxide 400 MG/5ML suspension Commonly known as:  MILK OF MAGNESIA Take 30 mLs by mouth daily as needed for mild constipation.   metoCLOPramide 10 MG tablet Commonly known as:  REGLAN Take 15 mg by mouth. TAKE 1 TABLET VIA TUBE BEFORE MEALS AND AT BEDTIME   olopatadine 0.1 % ophthalmic solution Commonly known as:  PATANOL Place 1 drop into both eyes every morning.   pantoprazole sodium 40 mg/20 mL Pack Commonly known as:  PROTONIX Take by mouth 2 (two) times daily.   polyethylene glycol packet Commonly known as:  MIRALAX / GLYCOLAX Place 17 g into feeding tube daily as needed.   polyvinyl alcohol 1.4 % ophthalmic solution Commonly known as:  LIQUIFILM TEARS Place 1 drop into both eyes daily at 6 (six) AM.   promethazine 25 MG tablet Commonly known as:  PHENERGAN Place 25 mg into feeding tube every 6 (six) hours as needed for nausea or vomiting.   RA SALINE ENEMA 19-7 GM/118ML Enem Place 1 each rectally as needed (for constipation).   simethicone 40 MG/0.6ML drops Commonly known as:  MYLICON Take 40 mg by mouth. Give 1.2 mls ( 80 mg total ) by G-tube twice daily   sterile water for irrigation Flush 255ml of water into peg tub every 4 hours        No orders of the defined types were placed in this encounter.   Immunization History  Administered Date(s) Administered  . Influenza Whole 12/31/2012  . Influenza-Unspecified 12/29/2011, 12/31/2012, 12/22/2014, 01/22/2016, 01/05/2017  . PPD Test 08/22/2008  . Pneumococcal-Unspecified 06/23/2010, 06/23/2015    Social History   Tobacco Use  . Smoking status: Never Smoker  . Smokeless tobacco: Never Used  Substance Use Topics  . Alcohol use: No    Review of Systems  DATA OBTAINED: from patient, nurse GENERAL:  no fevers, fatigue, appetite changes SKIN: No itching, rash HEENT: No complaint RESPIRATORY: No cough, wheezing, SOB CARDIAC: No chest pain, palpitations, lower extremity edema  GI: No abdominal pain, No N/V/D or constipation, No heartburn or reflux  GU:  No dysuria, frequency or urgency, or incontinence  MUSCULOSKELETAL: No unrelieved bone/joint pain NEUROLOGIC: No headache, dizziness  PSYCHIATRIC: No overt anxiety or sadness  Vitals:   03/12/18 1307  BP: 122/70  Pulse: 74  Resp: 18  Temp: 98 F (36.7 C)   Body mass index is 23.51 kg/m. Physical Exam  GENERAL APPEARANCE: Alert, non-conversant, No acute distress  SKIN: No diaphoresis rash HEENT: Unremarkable RESPIRATORY: Breathing is even, unlabored.  CARDIOVASCULAR: No peripheral edema  GASTROINTESTINAL: Abdomen is not distended w/ PEG tube GENITOURINARY: Bladder non tender, not distended  MUSCULOSKELETAL: Wasting and contractures upper extremity and lower extremities NEUROLOGIC: Cranial nerves 2-12 grossly intact: Quadriplegia patient has some movement of upper extremities PSYCHIATRIC: Mood and affect appropriate to situation, no behavioral issues  Patient Active Problem List   Diagnosis Date Noted  . Acute blood loss anemia 12/19/2017  . Mental status change resolved 11/05/2017  . Hematemesis 10/29/2017  . Upper GI bleed 10/29/2017  . Erosive esophagitis 10/29/2017  . Pseudomonas urinary  tract infection 10/29/2017  . Volume overload 10/29/2017  . Compression fracture of lumbar spine, non-traumatic, sequela 10/29/2017  . Cheyne-Stokes breathing 10/29/2017  . Hyperkalemia 10/26/2017  . Urinary tract infection due to Proteus 09/21/2017  . Infection of PEG site ([place]) 09/21/2017  . Hydronephrosis with renal and ureteral calculus obstruction 09/21/2017  . Left ureteral calculus 09/21/2017  . Flexion contractures 09/21/2017  . Vitamin D deficiency 10/16/2016  . Anemia of chronic disease   . GERD (gastroesophageal reflux disease)   . Clostridium difficile colitis   . Adrenocortical insufficiency ([place])   . Cataract 06/13/2016  . Constipation 06/13/2016  . Contracture of joint of left hand 06/13/2016  . Impaired verbal communication 06/13/2016  . Nontraumatic chronic subdural hemorrhage ([place]) 06/13/2016  . Nonverbal 06/13/2016  . Wheelchair bound 06/13/2016  . Allergic rhinitis 06/13/2016  . S/P percutaneous endoscopic gastrostomy (PEG) tube placement ([place]) 06/13/2016  . Itchy eyes 11/12/2015  . Benign neoplasm of colon 05/22/2015  . History of colon polyps 05/22/2015  . Lung nodule 05/22/2015  . Abnormal levels of other serum enzymes 05/22/2015  . Gastroesophageal reflux disease 03/17/2015  . Tinea cruris 09/10/2014  . Aspiration pneumonia ([place]) 08/15/2014  . Abscess, prostate 08/15/2014  . Pyelonephritis 08/06/2014  . Nausea & vomiting   . Fever presenting with conditions classified elsewhere 03/16/2014  . Altered mental status 03/16/2014  . Acute bronchiolitis due to unspecified organism 03/11/2014  . Enteritis due to Clostridium difficile 03/11/2014  . Type 2 diabetes mellitus without complication ([place]) 67/02/4579  . Septic shock ([place]) 01/26/2014  . Sepsis ([place]) 01/25/2014  . Acute encephalopathy 01/02/2014  . Acute respiratory failure with hypercapnia ([place]) 01/02/2014  . Chronic anemia 01/02/2014  . Subdural hematoma ([place]) 01/02/2014  . Pain in thoracic spine  12/26/2013  . PEG (percutaneous endoscopic gastrostomy) status ([place]) 10/04/2013  . Rash and nonspecific skin eruption 07/08/2013  . Thrush 07/08/2013  . Heme positive stool 07/07/2013  . Severe sepsis ([place]) 06/23/2013  . E. coli pyelonephritis 06/22/2013  . SIRS (systemic inflammatory response syndrome) ([place]) 06/19/2013  . Leukocytosis 06/19/2013  . Bronchitis 06/19/2013  . Dysphagia 06/19/2013  . Aphasia 06/19/2013  . Other convulsions 08/10/2012  . Iron deficiency anemia 08/10/2012  . Glucocorticoid deficiency ([place]) 08/10/2012  . Febrile illness, acute 06/15/2012  . UTI (lower urinary tract infection) 06/15/2012  . Hypokalemia 11/10/2011  . Diabetic gastroparesis ([place]) 11/08/2011  . Ileus ([place]) 11/08/2011  . Hyponatremia 11/08/2011  . Adrenal insufficiency ([place]) 11/08/2011  . Quadriplegia (  [place]) 11/08/2011  . Parkinson disease ([place])   . Seizures ([place])   . Depression     CMP     Component Value Date/Time   NA 137 10/25/2017   NA 135 09/18/2017   K 4.3 10/30/2017   K 4.0 09/18/2017   CL 108 06/26/2016 1424   CO2 26 06/26/2016 1424   GLUCOSE 107 (H) 06/26/2016 1424   BUN 11 10/25/2017   CREATININE 0.5 (A) 10/25/2017   CREATININE 0.49 09/18/2017   CALCIUM 8.6 09/18/2017   PROT 8.3 (H) 06/26/2016 1424   ALBUMIN 3.6 06/26/2016 1424   AST 22 05/26/2017   ALT 15 05/26/2017   ALKPHOS 106 05/26/2017   BILITOT 0.5 06/26/2016 1424   GFRNONAA >60 06/26/2016 1424   GFRAA >90 09/18/2017   Recent Labs    06/05/17 09/18/17 10/25/17 10/30/17  NA 138 135 137  --   K 3.9 4.0 5.3 4.3  BUN 16 12 11   --   CREATININE 0.5* 0.49 0.5*  --   CALCIUM  --  8.6  --   --    Recent Labs    05/26/17  AST 22  ALT 15  ALKPHOS 106   Recent Labs    06/05/17 09/18/17 10/25/17  WBC 7.4 7.5 9.7  HGB 11.6* 11.0* 9.8*  HCT 38* 34* 30*  PLT 200 298 331   No results for input(s): CHOL, LDLCALC, TRIG in the last 8760 hours.  Invalid input(s): HCL Lab Results  Component Value Date    MICROALBUR 10.4 12/01/2016   Lab Results  Component Value Date   TSH 1.78 09/13/2016   Lab Results  Component Value Date   HGBA1C 5.0 03/24/2017   Lab Results  Component Value Date   CHOL 127 09/13/2016   HDL 55 09/13/2016   LDLCALC 70 09/13/2016   TRIG 79 09/13/2016    Significant Diagnostic Results in last 30 days:  No results found.  Assessment and Plan  Type 2 diabetes mellitus without complication Continues controlled on diet alone per PEG tube  Parkinson disease (HCC) Continue amantadine 100 mg twice daily per tube, and baclofen 5 mg 4 times daily per tube for contractures  Aphasia Chronic and stable; patient can point to a board and make gestures to communicate and understands when spoken to; continue supportive care      Webb Silversmith D. [name], MD

## 2018-03-14 DIAGNOSIS — M6281 Muscle weakness (generalized): Secondary | ICD-10-CM | POA: Diagnosis not present

## 2018-03-14 DIAGNOSIS — M4856XD Collapsed vertebra, not elsewhere classified, lumbar region, subsequent encounter for fracture with routine healing: Secondary | ICD-10-CM | POA: Diagnosis not present

## 2018-03-15 DIAGNOSIS — M4856XD Collapsed vertebra, not elsewhere classified, lumbar region, subsequent encounter for fracture with routine healing: Secondary | ICD-10-CM | POA: Diagnosis not present

## 2018-03-15 DIAGNOSIS — M6281 Muscle weakness (generalized): Secondary | ICD-10-CM | POA: Diagnosis not present

## 2018-03-16 DIAGNOSIS — M6281 Muscle weakness (generalized): Secondary | ICD-10-CM | POA: Diagnosis not present

## 2018-03-16 DIAGNOSIS — M4856XD Collapsed vertebra, not elsewhere classified, lumbar region, subsequent encounter for fracture with routine healing: Secondary | ICD-10-CM | POA: Diagnosis not present

## 2018-03-17 ENCOUNTER — Encounter: Payer: Self-pay | Admitting: Internal Medicine

## 2018-03-17 NOTE — Assessment & Plan Note (Signed)
Continues controlled on diet alone per PEG tube

## 2018-03-17 NOTE — Assessment & Plan Note (Signed)
Continue amantadine 100 mg twice daily per tube, and baclofen 5 mg 4 times daily per tube for contractures

## 2018-03-17 NOTE — Assessment & Plan Note (Signed)
Chronic and stable; patient can point to a board and make gestures to communicate and understands when spoken to; continue supportive care

## 2018-03-19 DIAGNOSIS — M6281 Muscle weakness (generalized): Secondary | ICD-10-CM | POA: Diagnosis not present

## 2018-03-19 DIAGNOSIS — M4856XD Collapsed vertebra, not elsewhere classified, lumbar region, subsequent encounter for fracture with routine healing: Secondary | ICD-10-CM | POA: Diagnosis not present

## 2018-03-20 DIAGNOSIS — M6281 Muscle weakness (generalized): Secondary | ICD-10-CM | POA: Diagnosis not present

## 2018-03-20 DIAGNOSIS — M4856XD Collapsed vertebra, not elsewhere classified, lumbar region, subsequent encounter for fracture with routine healing: Secondary | ICD-10-CM | POA: Diagnosis not present

## 2018-03-21 DIAGNOSIS — M4856XD Collapsed vertebra, not elsewhere classified, lumbar region, subsequent encounter for fracture with routine healing: Secondary | ICD-10-CM | POA: Diagnosis not present

## 2018-03-21 DIAGNOSIS — M6281 Muscle weakness (generalized): Secondary | ICD-10-CM | POA: Diagnosis not present

## 2018-03-22 DIAGNOSIS — M4856XD Collapsed vertebra, not elsewhere classified, lumbar region, subsequent encounter for fracture with routine healing: Secondary | ICD-10-CM | POA: Diagnosis not present

## 2018-03-22 DIAGNOSIS — M6281 Muscle weakness (generalized): Secondary | ICD-10-CM | POA: Diagnosis not present

## 2018-03-23 DIAGNOSIS — M6281 Muscle weakness (generalized): Secondary | ICD-10-CM | POA: Diagnosis not present

## 2018-03-23 DIAGNOSIS — M4856XD Collapsed vertebra, not elsewhere classified, lumbar region, subsequent encounter for fracture with routine healing: Secondary | ICD-10-CM | POA: Diagnosis not present

## 2018-03-26 DIAGNOSIS — M6281 Muscle weakness (generalized): Secondary | ICD-10-CM | POA: Diagnosis not present

## 2018-03-26 DIAGNOSIS — M4856XD Collapsed vertebra, not elsewhere classified, lumbar region, subsequent encounter for fracture with routine healing: Secondary | ICD-10-CM | POA: Diagnosis not present

## 2018-03-26 DIAGNOSIS — E1151 Type 2 diabetes mellitus with diabetic peripheral angiopathy without gangrene: Secondary | ICD-10-CM | POA: Diagnosis not present

## 2018-03-26 DIAGNOSIS — B351 Tinea unguium: Secondary | ICD-10-CM | POA: Diagnosis not present

## 2018-03-27 DIAGNOSIS — M4856XD Collapsed vertebra, not elsewhere classified, lumbar region, subsequent encounter for fracture with routine healing: Secondary | ICD-10-CM | POA: Diagnosis not present

## 2018-03-27 DIAGNOSIS — M6281 Muscle weakness (generalized): Secondary | ICD-10-CM | POA: Diagnosis not present

## 2018-03-28 DIAGNOSIS — M6281 Muscle weakness (generalized): Secondary | ICD-10-CM | POA: Diagnosis not present

## 2018-03-28 DIAGNOSIS — M4856XD Collapsed vertebra, not elsewhere classified, lumbar region, subsequent encounter for fracture with routine healing: Secondary | ICD-10-CM | POA: Diagnosis not present

## 2018-03-29 DIAGNOSIS — M6281 Muscle weakness (generalized): Secondary | ICD-10-CM | POA: Diagnosis not present

## 2018-03-29 DIAGNOSIS — M4856XD Collapsed vertebra, not elsewhere classified, lumbar region, subsequent encounter for fracture with routine healing: Secondary | ICD-10-CM | POA: Diagnosis not present

## 2018-03-30 DIAGNOSIS — M6281 Muscle weakness (generalized): Secondary | ICD-10-CM | POA: Diagnosis not present

## 2018-03-30 DIAGNOSIS — M4856XD Collapsed vertebra, not elsewhere classified, lumbar region, subsequent encounter for fracture with routine healing: Secondary | ICD-10-CM | POA: Diagnosis not present

## 2018-04-02 DIAGNOSIS — M6281 Muscle weakness (generalized): Secondary | ICD-10-CM | POA: Diagnosis not present

## 2018-04-02 DIAGNOSIS — M4856XD Collapsed vertebra, not elsewhere classified, lumbar region, subsequent encounter for fracture with routine healing: Secondary | ICD-10-CM | POA: Diagnosis not present

## 2018-04-03 DIAGNOSIS — M6281 Muscle weakness (generalized): Secondary | ICD-10-CM | POA: Diagnosis not present

## 2018-04-03 DIAGNOSIS — M4856XD Collapsed vertebra, not elsewhere classified, lumbar region, subsequent encounter for fracture with routine healing: Secondary | ICD-10-CM | POA: Diagnosis not present

## 2018-04-13 ENCOUNTER — Encounter: Payer: Self-pay | Admitting: Internal Medicine

## 2018-04-13 ENCOUNTER — Non-Acute Institutional Stay (SKILLED_NURSING_FACILITY): Payer: Medicare Other | Admitting: Internal Medicine

## 2018-04-13 DIAGNOSIS — J40 Bronchitis, not specified as acute or chronic: Secondary | ICD-10-CM

## 2018-04-13 DIAGNOSIS — R0602 Shortness of breath: Secondary | ICD-10-CM | POA: Diagnosis not present

## 2018-04-13 DIAGNOSIS — D649 Anemia, unspecified: Secondary | ICD-10-CM | POA: Diagnosis not present

## 2018-04-13 DIAGNOSIS — Z79899 Other long term (current) drug therapy: Secondary | ICD-10-CM | POA: Diagnosis not present

## 2018-04-13 DIAGNOSIS — I69121 Dysphasia following nontraumatic intracerebral hemorrhage: Secondary | ICD-10-CM | POA: Diagnosis not present

## 2018-04-13 DIAGNOSIS — R05 Cough: Secondary | ICD-10-CM | POA: Diagnosis not present

## 2018-04-13 LAB — CBC AND DIFFERENTIAL
HCT: 28 — AB (ref 41–53)
Hemoglobin: 9.2 — AB (ref 13.5–17.5)
PLATELETS: 246 (ref 150–399)
WBC: 11.3

## 2018-04-13 LAB — BASIC METABOLIC PANEL
BUN: 15 (ref 4–21)
CREATININE: 0.4 — AB (ref 0.6–1.3)
Glucose: 97
Potassium: 3.6 (ref 3.4–5.3)
Sodium: 135 — AB (ref 137–147)

## 2018-04-13 NOTE — Progress Notes (Signed)
Location:  [facility] Room Number: 214D Place of Service:  SNF (31)  [name], MD  Patient Care Team: [name], MD as PCP - General (Internal Medicine)  Extended Emergency Contact Information Primary Emergency Contact: Flett,[name] Address: [address]          [place], [place] [address] [name] Phone: [phone] Mobile Phone: [phone] Relation: Mother Secondary Emergency Contact: Constance Haw [place] Phone: [phone] Relation: Brother Father: Chelsey, Kimberley [place] Mobile Phone: [phone]    Allergies: Aspirin; Keflex [cephalexin]; Nsaids; and Penicillins  Chief Complaint  Patient presents with  . Acute Visit    Bronchitis    HPI: Patient is 63 y.o. male who is being seen today because the nurses reported that patient looks like he looks when he is sick.  He is quadriplegic and does not speak so this sort of information is highly relevant.  Patient has not been coughing but has had increased respiratory rate.  Patient's O2 saturation is a little but below normal but above 90%.  Patient has not had any fever.  A chest x-ray has been done results have returned.  Past Medical History:  Diagnosis Date  . Acute encephalopathy 01/02/2014  . Adrenal insufficiency ([place]) 11/08/2011  . Adrenocortical insufficiency ([place])   . Allergic rhinitis   . Anemia    Iron deficient  . Aphasia 06/19/2013  . Cataract   . Clostridium difficile colitis 02/2014  . Constipation   . Contracture of joint of left hand   . Depression   . Diabetic gastroparesis ([place]) 11/08/2011  . Dysphagia 06/19/2013  . Gastroesophageal reflux disease 03/17/2015  . GERD (gastroesophageal reflux disease)   . Glucocorticoid deficiency ([place]) 08/10/2012  . History of colon polyps 05/22/2015  . Ileus ([place]) 11/08/2011  . Impaired verbal communication   . Iron deficiency anemia 08/10/2012    . Lung nodule 05/22/2015  . Nontraumatic chronic subdural hemorrhage ([place])   . Parkinson disease ([place])   . Parkinson's disease ([place])   . PEG (percutaneous endoscopic gastrostomy) status ([place]) 10/04/2013  . Pneumonia 07/2014   aspiration pneumonia  . Pyelonephritis   . Quadriplegia ([place])   . S/P percutaneous endoscopic gastrostomy (PEG) tube placement ([place])   . Seizures ([place])    last occurance 2015  . SIRS (systemic inflammatory response syndrome) ([place]) 08/06/2014  . Subdural hematoma ([place]) 01/02/2014  . Type 2 diabetes mellitus without complication ([place]) 24/40/1027  . UTI (urinary tract infection) 07/2014  . Vitamin D deficiency 10/16/2016  . Wheelchair bound     Past Surgical History:  Procedure Laterality Date  . COLONOSCOPY    . ESOPHAGOGASTRODUODENOSCOPY    . PEG TUBE PLACEMENT    . PERIPHERALLY INSERTED CENTRAL CATHETER INSERTION    . TONSILLECTOMY  1962    Allergies as of 04/13/2018      Reactions   Aspirin Other (See Comments)   Unknown reaction; "blood doesn't clog too well" per mother.    Keflex [cephalexin]    Rash   Nsaids Other (See Comments)   Unknown reaction   Penicillins Hives, Other (See Comments)   Unknown reaction.  Pt has tolerated cephalosporins in the past.      Medication List       Accurate as of April 13, 2018 11:59 PM. Always use your most recent med list.        acetaminophen 325 MG tablet Commonly known  as:  TYLENOL Place 650 mg into feeding tube every 6 (six) hours as needed. Fever > 101   amantadine 50 MG/5ML solution Commonly known as:  SYMMETREL Take 100 mg by mouth. GIVE 10 CC (100 MG) BY PEG BID   amoxicillin-clavulanate 875-125 MG tablet Commonly known as:  AUGMENTIN 1 tablet. administer 1 tab via gtube bid x 7 days for bronchitis   Baclofen 5 MG Tabs Take by mouth. Give 1 tablet by Per Gtube route 4 times daily   bisacodyl 10 MG suppository Commonly known as:  DULCOLAX Place 10 mg rectally daily as needed for moderate  constipation. If constipation not relieved by milk of magnesia give one 10 mg suppository in 24hours   CETAPHIL GENTLE CLEANSER Liqd Apply to face and neck with water and then pat dry daily.   Cholecalciferol 5000 UNIT/ML Liqd Take by mouth. 10 mls by Per G Tube route once a week. Wednesdays   DILANTIN 125 MG/5ML suspension Generic drug:  phenytoin Place 100 mg into feeding tube 2 (two) times daily.   feeding supplement (JEVITY 1.5 CAL) Liqd Place into feeding tube. Initiate Jevity 1.5 cal @@ 58 ml/hr continuous x 20 hr via peg ( hold 1 hr pre/post phenton administration)   ferrous sulfate 220 (44 Fe) MG/5ML solution Take 220 mg by mouth. 7.5 ML VIA TUBE ONCE DAILY FOR ANEMIA   folic acid 1 MG tablet Commonly known as:  FOLVITE 1 mg by PEG Tube route daily.   hydrocortisone 20 MG tablet Commonly known as:  CORTEF Take 20 mg by mouth. Give 1 tablet via per tube twice daily   loratadine 10 MG tablet Commonly known as:  CLARITIN 10 mg by PEG Tube route daily.   magnesium hydroxide 400 MG/5ML suspension Commonly known as:  MILK OF MAGNESIA Take 30 mLs by mouth daily as needed for mild constipation.   metoCLOPramide 10 MG tablet Commonly known as:  REGLAN Take 15 mg by mouth. TAKE 1 TABLET VIA TUBE BEFORE MEALS AND AT BEDTIME   olopatadine 0.1 % ophthalmic solution Commonly known as:  PATANOL Place 1 drop into both eyes every morning.   pantoprazole sodium 40 mg/20 mL Pack Commonly known as:  PROTONIX Take by mouth 2 (two) times daily.   polyethylene glycol packet Commonly known as:  MIRALAX / GLYCOLAX Place 17 g into feeding tube daily as needed.   polyvinyl alcohol 1.4 % ophthalmic solution Commonly known as:  LIQUIFILM TEARS Place 1 drop into both eyes daily at 6 (six) AM.   promethazine 25 MG tablet Commonly known as:  PHENERGAN Place 25 mg into feeding tube every 6 (six) hours as needed for nausea or vomiting.   RA SALINE ENEMA 19-7 GM/118ML Enem Place 1  each rectally as needed (for constipation).   simethicone 40 MG/0.6ML drops Commonly known as:  MYLICON Take 40 mg by mouth. Give 1.2 mls ( 80 mg total ) by G-tube twice daily   sterile water for irrigation Flush 240ml of water into peg tub every 4 hours       No orders of the defined types were placed in this encounter.   Immunization History  Administered Date(s) Administered  . Influenza Whole 12/31/2012  . Influenza-Unspecified 12/29/2011, 12/31/2012, 12/22/2014, 01/22/2016, 01/05/2017  . PPD Test 08/22/2008  . Pneumococcal-Unspecified 06/23/2010, 06/23/2015    Social History   Tobacco Use  . Smoking status: Never Smoker  . Smokeless tobacco: Never Used  Substance Use Topics  . Alcohol use: No  Review of Systems  DATA OBTAINED: from nursing GENERAL:  no fevers, fatigue, appetite changes SKIN: No itching, rash HEENT: No complaint RESPIRATORY: No cough, wheezing, SOB CARDIAC: No chest pain, palpitations, lower extremity edema  GI: No abdominal pain, No N/V/D or constipation, No heartburn or reflux  GU: No dysuria, frequency or urgency, or incontinence  MUSCULOSKELETAL: No unrelieved bone/joint pain NEUROLOGIC: No headache, dizziness  PSYCHIATRIC: Per nursing mental status is a little off today  Vitals:   04/13/18 1409  BP: (!) 105/55  Pulse: 81  Resp: 18  Temp: 98 F (36.7 C)  SpO2: 95%   Body mass index is 22.89 kg/m. Physical Exam  GENERAL APPEARANCE: Alert, non conversant at baseline, No acute distress  SKIN: No diaphoresis rash HEENT: Unremarkable RESPIRATORY: Breathing is even, unlabored.  Slight increased respiratory rate lung sounds are clear except for mild rhonchi; no wheezing no rails CARDIOVASCULAR: Heart RRR no murmurs, rubs or gallops. No peripheral edema  GASTROINTESTINAL: Abdomen is soft, non-tender, not distended w/ normal bowel sounds.:  PEG tube GENITOURINARY: Bladder non tender, not distended  MUSCULOSKELETAL:  Contractures NEUROLOGIC: Cranial nerves grossly intact except speaking and swallowing: Quadriplegia with some upper extremity movement PSYCHIATRIC: Mood and affect appropriate to situation, no behavioral issues  Patient Active Problem List   Diagnosis Date Noted  . Acute blood loss anemia 12/19/2017  . Mental status change resolved 11/05/2017  . Hematemesis 10/29/2017  . Upper GI bleed 10/29/2017  . Erosive esophagitis 10/29/2017  . Pseudomonas urinary tract infection 10/29/2017  . Volume overload 10/29/2017  . Compression fracture of lumbar spine, non-traumatic, sequela 10/29/2017  . Cheyne-Stokes breathing 10/29/2017  . Hyperkalemia 10/26/2017  . Urinary tract infection due to Proteus 09/21/2017  . Infection of PEG site ([place]) 09/21/2017  . Hydronephrosis with renal and ureteral calculus obstruction 09/21/2017  . Left ureteral calculus 09/21/2017  . Flexion contractures 09/21/2017  . Vitamin D deficiency 10/16/2016  . Anemia of chronic disease   . GERD (gastroesophageal reflux disease)   . Clostridium difficile colitis   . Adrenocortical insufficiency ([place])   . Cataract 06/13/2016  . Constipation 06/13/2016  . Contracture of joint of left hand 06/13/2016  . Impaired verbal communication 06/13/2016  . Nontraumatic chronic subdural hemorrhage ([place]) 06/13/2016  . Nonverbal 06/13/2016  . Wheelchair bound 06/13/2016  . Allergic rhinitis 06/13/2016  . S/P percutaneous endoscopic gastrostomy (PEG) tube placement ([place]) 06/13/2016  . Itchy eyes 11/12/2015  . Benign neoplasm of colon 05/22/2015  . History of colon polyps 05/22/2015  . Lung nodule 05/22/2015  . Abnormal levels of other serum enzymes 05/22/2015  . Gastroesophageal reflux disease 03/17/2015  . Tinea cruris 09/10/2014  . Aspiration pneumonia ([place]) 08/15/2014  . Abscess, prostate 08/15/2014  . Pyelonephritis 08/06/2014  . Nausea & vomiting   . Fever presenting with conditions classified elsewhere 03/16/2014  .  Altered mental status 03/16/2014  . Acute bronchiolitis due to unspecified organism 03/11/2014  . Enteritis due to Clostridium difficile 03/11/2014  . Type 2 diabetes mellitus without complication ([place]) 85/88/5027  . Septic shock ([place]) 01/26/2014  . Sepsis ([place]) 01/25/2014  . Acute encephalopathy 01/02/2014  . Acute respiratory failure with hypercapnia ([place]) 01/02/2014  . Chronic anemia 01/02/2014  . Subdural hematoma ([place]) 01/02/2014  . Pain in thoracic spine 12/26/2013  . PEG (percutaneous endoscopic gastrostomy) status ([place]) 10/04/2013  . Rash and nonspecific skin eruption 07/08/2013  . Thrush 07/08/2013  . Heme positive stool 07/07/2013  . Severe sepsis ([place]) 06/23/2013  . E. coli pyelonephritis 06/22/2013  .  SIRS (systemic inflammatory response syndrome) ([place]) 06/19/2013  . Leukocytosis 06/19/2013  . Bronchitis 06/19/2013  . Dysphagia 06/19/2013  . Aphasia 06/19/2013  . Other convulsions 08/10/2012  . Iron deficiency anemia 08/10/2012  . Glucocorticoid deficiency ([place]) 08/10/2012  . Febrile illness, acute 06/15/2012  . UTI (lower urinary tract infection) 06/15/2012  . Hypokalemia 11/10/2011  . Diabetic gastroparesis ([place]) 11/08/2011  . Ileus ([place]) 11/08/2011  . Hyponatremia 11/08/2011  . Adrenal insufficiency ([place]) 11/08/2011  . Quadriplegia ([place]) 11/08/2011  . Parkinson disease ([place])   . Seizures ([place])   . Depression     CMP     Component Value Date/Time   NA 137 10/25/2017   NA 135 09/18/2017   K 4.3 10/30/2017   K 4.0 09/18/2017   CL 108 06/26/2016 1424   CO2 26 06/26/2016 1424   GLUCOSE 107 (H) 06/26/2016 1424   BUN 11 10/25/2017   CREATININE 0.5 (A) 10/25/2017   CREATININE 0.49 09/18/2017   CALCIUM 8.6 09/18/2017   PROT 8.3 (H) 06/26/2016 1424   ALBUMIN 3.6 06/26/2016 1424   AST 22 05/26/2017   ALT 15 05/26/2017   ALKPHOS 106 05/26/2017   BILITOT 0.5 06/26/2016 1424   GFRNONAA >60 06/26/2016 1424   GFRAA >90 09/18/2017   Recent Labs    06/05/17  09/18/17 10/25/17 10/30/17  NA 138 135 137  --   K 3.9 4.0 5.3 4.3  BUN 16 12 11   --   CREATININE 0.5* 0.49 0.5*  --   CALCIUM  --  8.6  --   --    Recent Labs    05/26/17  AST 22  ALT 15  ALKPHOS 106   Recent Labs    06/05/17 09/18/17 10/25/17  WBC 7.4 7.5 9.7  HGB 11.6* 11.0* 9.8*  HCT 38* 34* 30*  PLT 200 298 331   No results for input(s): CHOL, LDLCALC, TRIG in the last 8760 hours.  Invalid input(s): HCL Lab Results  Component Value Date   MICROALBUR 10.4 12/01/2016   Lab Results  Component Value Date   TSH 1.78 09/13/2016   Lab Results  Component Value Date   HGBA1C 5.0 03/24/2017   Lab Results  Component Value Date   CHOL 127 09/13/2016   HDL 55 09/13/2016   LDLCALC 70 09/13/2016   TRIG 79 09/13/2016    Significant Diagnostic Results in last 30 days:  No results found.  Assessment and Plan  Bronchitis/dysphasia - chest x-ray showed bronchitis versus alveolitis versus bronchiolitis or in the right circumstances could be bronchopneumonia or COPD.  In this patient appears to be bronchitis.  This patient can go to sepsis so this will need to be treated with an antibiotic.  It was not listed that patient was allergic to penicillin and patient has been on Augmentin prior without problems but when the 6 pharmacy meds where he had a pen allergy listed we changed his antibiotic from Augmentin to [facility] which patient has also been on multiple x300 mg twice daily for 7 days all per tube: Continue supportive care    Time spent greater than 35 minutes;> 50% of time with patient was spent reviewing records, labs, tests and studies, counseling and developing plan of care  Inocencio Homes, MD       Vitals:   04/13/18 1409  BP: (!) 105/55  Pulse: 81  Resp: 18  Temp: 98 F (36.7 C)  SpO2: 95%    SpO2 Readings from Last 1 Encounters:  04/13/18 95%  Body mass index is 22.89 kg/m.     Physical Exam  GENERAL APPEARANCE: Alert, conversant,  No acute  distress.  SKIN: No diaphoresis rash HEAD: Normocephalic, atraumatic  EYES: Conjunctiva/lids clear. Pupils round, reactive. EOMs intact.  EARS: External exam WNL, canals clear. Hearing grossly normal.  NOSE: No deformity or discharge.  MOUTH/THROAT: Lips w/o lesions  RESPIRATORY: Breathing is even, unlabored. Lung sounds are clear   CARDIOVASCULAR: Heart RRR no murmurs, rubs or gallops. No peripheral edema.   GASTROINTESTINAL: Abdomen is soft, non-tender, not distended w/ normal bowel sounds. GENITOURINARY: Bladder non tender, not distended  MUSCULOSKELETAL: No abnormal joints or musculature NEUROLOGIC:  Cranial nerves 2-12 grossly intact. Moves all extremities  PSYCHIATRIC: Mood and affect appropriate to situation, no behavioral issues  Patient Active Problem List   Diagnosis Date Noted  . Acute blood loss anemia 12/19/2017  . Mental status change resolved 11/05/2017  . Hematemesis 10/29/2017  . Upper GI bleed 10/29/2017  . Erosive esophagitis 10/29/2017  . Pseudomonas urinary tract infection 10/29/2017  . Volume overload 10/29/2017  . Compression fracture of lumbar spine, non-traumatic, sequela 10/29/2017  . Cheyne-Stokes breathing 10/29/2017  . Hyperkalemia 10/26/2017  . Urinary tract infection due to Proteus 09/21/2017  . Infection of PEG site ([place]) 09/21/2017  . Hydronephrosis with renal and ureteral calculus obstruction 09/21/2017  . Left ureteral calculus 09/21/2017  . Flexion contractures 09/21/2017  . Vitamin D deficiency 10/16/2016  . Anemia of chronic disease   . GERD (gastroesophageal reflux disease)   . Clostridium difficile colitis   . Adrenocortical insufficiency ([place])   . Cataract 06/13/2016  . Constipation 06/13/2016  . Contracture of joint of left hand 06/13/2016  . Impaired verbal communication 06/13/2016  . Nontraumatic chronic subdural hemorrhage ([place]) 06/13/2016  . Nonverbal 06/13/2016  . Wheelchair bound 06/13/2016  . Allergic rhinitis 06/13/2016  .  S/P percutaneous endoscopic gastrostomy (PEG) tube placement ([place]) 06/13/2016  . Itchy eyes 11/12/2015  . Benign neoplasm of colon 05/22/2015  . History of colon polyps 05/22/2015  . Lung nodule 05/22/2015  . Abnormal levels of other serum enzymes 05/22/2015  . Gastroesophageal reflux disease 03/17/2015  . Tinea cruris 09/10/2014  . Aspiration pneumonia ([place]) 08/15/2014  . Abscess, prostate 08/15/2014  . Pyelonephritis 08/06/2014  . Nausea & vomiting   . Fever presenting with conditions classified elsewhere 03/16/2014  . Altered mental status 03/16/2014  . Acute bronchiolitis due to unspecified organism 03/11/2014  . Enteritis due to Clostridium difficile 03/11/2014  . Type 2 diabetes mellitus without complication ([place]) 54/49/2010  . Septic shock ([place]) 01/26/2014  . Sepsis ([place]) 01/25/2014  . Acute encephalopathy 01/02/2014  . Acute respiratory failure with hypercapnia ([place]) 01/02/2014  . Chronic anemia 01/02/2014  . Subdural hematoma ([place]) 01/02/2014  . Pain in thoracic spine 12/26/2013  . PEG (percutaneous endoscopic gastrostomy) status ([place]) 10/04/2013  . Rash and nonspecific skin eruption 07/08/2013  . Thrush 07/08/2013  . Heme positive stool 07/07/2013  . Severe sepsis ([place]) 06/23/2013  . E. coli pyelonephritis 06/22/2013  . SIRS (systemic inflammatory response syndrome) ([place]) 06/19/2013  . Leukocytosis 06/19/2013  . Bronchitis 06/19/2013  . Dysphagia 06/19/2013  . Aphasia 06/19/2013  . Other convulsions 08/10/2012  . Iron deficiency anemia 08/10/2012  . Glucocorticoid deficiency ([place]) 08/10/2012  . Febrile illness, acute 06/15/2012  . UTI (lower urinary tract infection) 06/15/2012  . Hypokalemia 11/10/2011  . Diabetic gastroparesis ([place]) 11/08/2011  . Ileus ([place]) 11/08/2011  . Hyponatremia 11/08/2011  . Adrenal insufficiency ([place]) 11/08/2011  . Quadriplegia (  [place]) 11/08/2011  . Parkinson disease ([place])   . Seizures ([place])   . Depression       Labs  reviewed: Basic Metabolic Panel:    Component Value Date/Time   NA 137 10/25/2017   NA 135 09/18/2017   K 4.3 10/30/2017   K 4.0 09/18/2017   CL 108 06/26/2016 1424   CO2 26 06/26/2016 1424   GLUCOSE 107 (H) 06/26/2016 1424   BUN 11 10/25/2017   CREATININE 0.5 (A) 10/25/2017   CREATININE 0.49 09/18/2017   CALCIUM 8.6 09/18/2017   PROT 8.3 (H) 06/26/2016 1424   ALBUMIN 3.6 06/26/2016 1424   AST 22 05/26/2017   ALT 15 05/26/2017   ALKPHOS 106 05/26/2017   BILITOT 0.5 06/26/2016 1424   GFRNONAA >60 06/26/2016 1424   GFRAA >90 09/18/2017    Recent Labs    06/05/17 09/18/17 10/25/17 10/30/17  NA 138 135 137  --   K 3.9 4.0 5.3 4.3  BUN 16 12 11   --   CREATININE 0.5* 0.49 0.5*  --   CALCIUM  --  8.6  --   --    Liver Function Tests: Recent Labs    05/26/17  AST 22  ALT 15  ALKPHOS 106   No results for input(s): LIPASE, AMYLASE in the last 8760 hours. No results for input(s): AMMONIA in the last 8760 hours. CBC: Recent Labs    06/05/17 09/18/17 10/25/17  WBC 7.4 7.5 9.7  HGB 11.6* 11.0* 9.8*  HCT 38* 34* 30*  PLT 200 298 331   Lipid No results for input(s): CHOL, HDL, LDLCALC, TRIG in the last 8760 hours.  Cardiac Enzymes: No results for input(s): CKTOTAL, CKMB, CKMBINDEX, TROPONINI in the last 8760 hours. BNP: No results for input(s): BNP in the last 8760 hours. Lab Results  Component Value Date   MICROALBUR 10.4 12/01/2016   Lab Results  Component Value Date   HGBA1C 5.0 03/24/2017   Lab Results  Component Value Date   TSH 1.78 09/13/2016   Lab Results  Component Value Date   [id number] 867 01/29/2014   No results found for: FOLATE Lab Results  Component Value Date   IRON 48 01/29/2014   TIBC 125 (L) 01/29/2014    Imaging and Procedures obtained prior to SNF admission: Dg Abd Acute W/chest  Result Date: 06/26/2016 CLINICAL DATA:  Nausea and vomiting. Parkinson disease. Gastrostomy tube feeding. Quadriplegia. EXAM: DG ABDOMEN ACUTE W/ 1V  CHEST COMPARISON:  01/11/2016 chest radiograph. 11/26/2014 CT abdomen/pelvis. FINDINGS: Stable cardiomediastinal silhouette with normal heart size and aortic atherosclerosis. No pneumothorax. No pleural effusion. Slightly low lung volumes. No pulmonary edema. No acute consolidative airspace disease. Percutaneous gastrostomy tube is seen terminating in the left upper quadrant of the abdomen. No disproportionately dilated small bowel loops or significant air-fluid levels . Moderate colorectal stool volume. No evidence of pneumatosis or pneumoperitoneum. No radiopaque urolithiasis. IMPRESSION: 1. No active disease in the chest . 2. Percutaneous gastrostomy tube terminates in the left upper quadrant of the abdomen. No evidence of free intraperitoneal air. 3. Nonobstructive bowel gas pattern . 4. Moderate colorectal stool volume. Electronically Signed   By: Ilona Sorrel M.D.   On: 06/26/2016 14:57     Not all labs, radiology exams or other studies done during hospitalization come through on my EPIC note; however they are reviewed by me.    Assessment and Plan  No problem-specific Assessment & Plan notes found for this encounter.   [name], MD

## 2018-04-14 ENCOUNTER — Encounter: Payer: Self-pay | Admitting: Internal Medicine

## 2018-04-15 DIAGNOSIS — E2749 Other adrenocortical insufficiency: Secondary | ICD-10-CM | POA: Diagnosis present

## 2018-04-15 DIAGNOSIS — G8250 Quadriplegia, unspecified: Secondary | ICD-10-CM | POA: Diagnosis not present

## 2018-04-15 DIAGNOSIS — J181 Lobar pneumonia, unspecified organism: Secondary | ICD-10-CM | POA: Diagnosis not present

## 2018-04-15 DIAGNOSIS — A419 Sepsis, unspecified organism: Secondary | ICD-10-CM | POA: Diagnosis not present

## 2018-04-15 DIAGNOSIS — E8809 Other disorders of plasma-protein metabolism, not elsewhere classified: Secondary | ICD-10-CM | POA: Diagnosis not present

## 2018-04-15 DIAGNOSIS — K5909 Other constipation: Secondary | ICD-10-CM | POA: Diagnosis not present

## 2018-04-15 DIAGNOSIS — I509 Heart failure, unspecified: Secondary | ICD-10-CM | POA: Diagnosis not present

## 2018-04-15 DIAGNOSIS — I69822 Dysarthria following other cerebrovascular disease: Secondary | ICD-10-CM | POA: Diagnosis not present

## 2018-04-15 DIAGNOSIS — M4854XA Collapsed vertebra, not elsewhere classified, thoracic region, initial encounter for fracture: Secondary | ICD-10-CM | POA: Diagnosis not present

## 2018-04-15 DIAGNOSIS — K3184 Gastroparesis: Secondary | ICD-10-CM | POA: Diagnosis not present

## 2018-04-15 DIAGNOSIS — I69828 Other speech and language deficits following other cerebrovascular disease: Secondary | ICD-10-CM | POA: Diagnosis not present

## 2018-04-15 DIAGNOSIS — J9602 Acute respiratory failure with hypercapnia: Secondary | ICD-10-CM | POA: Diagnosis not present

## 2018-04-15 DIAGNOSIS — Y95 Nosocomial condition: Secondary | ICD-10-CM | POA: Diagnosis not present

## 2018-04-15 DIAGNOSIS — K219 Gastro-esophageal reflux disease without esophagitis: Secondary | ICD-10-CM | POA: Diagnosis not present

## 2018-04-15 DIAGNOSIS — I10 Essential (primary) hypertension: Secondary | ICD-10-CM | POA: Diagnosis not present

## 2018-04-15 DIAGNOSIS — D509 Iron deficiency anemia, unspecified: Secondary | ICD-10-CM | POA: Diagnosis not present

## 2018-04-15 DIAGNOSIS — R0689 Other abnormalities of breathing: Secondary | ICD-10-CM | POA: Diagnosis not present

## 2018-04-15 DIAGNOSIS — R509 Fever, unspecified: Secondary | ICD-10-CM | POA: Diagnosis not present

## 2018-04-15 DIAGNOSIS — J80 Acute respiratory distress syndrome: Secondary | ICD-10-CM | POA: Diagnosis not present

## 2018-04-15 DIAGNOSIS — Z931 Gastrostomy status: Secondary | ICD-10-CM | POA: Diagnosis not present

## 2018-04-15 DIAGNOSIS — R569 Unspecified convulsions: Secondary | ICD-10-CM | POA: Diagnosis not present

## 2018-04-15 DIAGNOSIS — M6281 Muscle weakness (generalized): Secondary | ICD-10-CM | POA: Diagnosis not present

## 2018-04-15 DIAGNOSIS — R1312 Dysphagia, oropharyngeal phase: Secondary | ICD-10-CM | POA: Diagnosis not present

## 2018-04-15 DIAGNOSIS — L89896 Pressure-induced deep tissue damage of other site: Secondary | ICD-10-CM | POA: Diagnosis present

## 2018-04-15 DIAGNOSIS — M24542 Contracture, left hand: Secondary | ICD-10-CM | POA: Diagnosis not present

## 2018-04-15 DIAGNOSIS — R0602 Shortness of breath: Secondary | ICD-10-CM | POA: Diagnosis not present

## 2018-04-15 DIAGNOSIS — Z431 Encounter for attention to gastrostomy: Secondary | ICD-10-CM | POA: Diagnosis not present

## 2018-04-15 DIAGNOSIS — D649 Anemia, unspecified: Secondary | ICD-10-CM | POA: Diagnosis not present

## 2018-04-15 DIAGNOSIS — Z886 Allergy status to analgesic agent status: Secondary | ICD-10-CM | POA: Diagnosis not present

## 2018-04-15 DIAGNOSIS — R0902 Hypoxemia: Secondary | ICD-10-CM | POA: Diagnosis not present

## 2018-04-15 DIAGNOSIS — E876 Hypokalemia: Secondary | ICD-10-CM | POA: Diagnosis not present

## 2018-04-15 DIAGNOSIS — R1310 Dysphagia, unspecified: Secondary | ICD-10-CM | POA: Diagnosis not present

## 2018-04-15 DIAGNOSIS — J9809 Other diseases of bronchus, not elsewhere classified: Secondary | ICD-10-CM | POA: Diagnosis not present

## 2018-04-15 DIAGNOSIS — N390 Urinary tract infection, site not specified: Secondary | ICD-10-CM | POA: Diagnosis not present

## 2018-04-15 DIAGNOSIS — J13 Pneumonia due to Streptococcus pneumoniae: Secondary | ICD-10-CM | POA: Diagnosis not present

## 2018-04-15 DIAGNOSIS — T17890A Other foreign object in other parts of respiratory tract causing asphyxiation, initial encounter: Secondary | ICD-10-CM | POA: Diagnosis not present

## 2018-04-15 DIAGNOSIS — N3001 Acute cystitis with hematuria: Secondary | ICD-10-CM | POA: Diagnosis present

## 2018-04-15 DIAGNOSIS — J9690 Respiratory failure, unspecified, unspecified whether with hypoxia or hypercapnia: Secondary | ICD-10-CM | POA: Diagnosis not present

## 2018-04-15 DIAGNOSIS — I69891 Dysphagia following other cerebrovascular disease: Secondary | ICD-10-CM | POA: Diagnosis not present

## 2018-04-15 DIAGNOSIS — G40909 Epilepsy, unspecified, not intractable, without status epilepticus: Secondary | ICD-10-CM | POA: Diagnosis not present

## 2018-04-15 DIAGNOSIS — I69398 Other sequelae of cerebral infarction: Secondary | ICD-10-CM | POA: Diagnosis not present

## 2018-04-15 DIAGNOSIS — R8589 Other abnormal findings in specimens from digestive organs and abdominal cavity: Secondary | ICD-10-CM | POA: Diagnosis not present

## 2018-04-15 DIAGNOSIS — J90 Pleural effusion, not elsewhere classified: Secondary | ICD-10-CM | POA: Diagnosis not present

## 2018-04-15 DIAGNOSIS — E873 Alkalosis: Secondary | ICD-10-CM | POA: Diagnosis present

## 2018-04-15 DIAGNOSIS — Z794 Long term (current) use of insulin: Secondary | ICD-10-CM | POA: Diagnosis not present

## 2018-04-15 DIAGNOSIS — L89316 Pressure-induced deep tissue damage of right buttock: Secondary | ICD-10-CM | POA: Diagnosis present

## 2018-04-15 DIAGNOSIS — Z8719 Personal history of other diseases of the digestive system: Secondary | ICD-10-CM | POA: Diagnosis not present

## 2018-04-15 DIAGNOSIS — J188 Other pneumonia, unspecified organism: Secondary | ICD-10-CM | POA: Diagnosis not present

## 2018-04-15 DIAGNOSIS — Z888 Allergy status to other drugs, medicaments and biological substances status: Secondary | ICD-10-CM | POA: Diagnosis not present

## 2018-04-15 DIAGNOSIS — E870 Hyperosmolality and hypernatremia: Secondary | ICD-10-CM | POA: Diagnosis not present

## 2018-04-15 DIAGNOSIS — R918 Other nonspecific abnormal finding of lung field: Secondary | ICD-10-CM | POA: Diagnosis not present

## 2018-04-15 DIAGNOSIS — Z880 Allergy status to penicillin: Secondary | ICD-10-CM | POA: Diagnosis not present

## 2018-04-15 DIAGNOSIS — J811 Chronic pulmonary edema: Secondary | ICD-10-CM | POA: Diagnosis not present

## 2018-04-15 DIAGNOSIS — D638 Anemia in other chronic diseases classified elsewhere: Secondary | ICD-10-CM | POA: Diagnosis not present

## 2018-04-15 DIAGNOSIS — E1143 Type 2 diabetes mellitus with diabetic autonomic (poly)neuropathy: Secondary | ICD-10-CM | POA: Diagnosis not present

## 2018-04-15 DIAGNOSIS — Z9911 Dependence on respirator [ventilator] status: Secondary | ICD-10-CM | POA: Diagnosis not present

## 2018-04-15 DIAGNOSIS — D6489 Other specified anemias: Secondary | ICD-10-CM | POA: Diagnosis not present

## 2018-04-15 DIAGNOSIS — Z7401 Bed confinement status: Secondary | ICD-10-CM | POA: Diagnosis not present

## 2018-04-15 DIAGNOSIS — K5900 Constipation, unspecified: Secondary | ICD-10-CM | POA: Diagnosis not present

## 2018-04-15 DIAGNOSIS — E11649 Type 2 diabetes mellitus with hypoglycemia without coma: Secondary | ICD-10-CM | POA: Diagnosis not present

## 2018-04-15 DIAGNOSIS — R0682 Tachypnea, not elsewhere classified: Secondary | ICD-10-CM | POA: Diagnosis not present

## 2018-04-15 DIAGNOSIS — R000 Tachycardia, unspecified: Secondary | ICD-10-CM | POA: Diagnosis not present

## 2018-04-15 DIAGNOSIS — I959 Hypotension, unspecified: Secondary | ICD-10-CM | POA: Diagnosis not present

## 2018-04-15 DIAGNOSIS — L89326 Pressure-induced deep tissue damage of left buttock: Secondary | ICD-10-CM | POA: Diagnosis present

## 2018-04-15 DIAGNOSIS — N12 Tubulo-interstitial nephritis, not specified as acute or chronic: Secondary | ICD-10-CM | POA: Diagnosis not present

## 2018-04-15 DIAGNOSIS — Z881 Allergy status to other antibiotic agents status: Secondary | ICD-10-CM | POA: Diagnosis not present

## 2018-04-15 DIAGNOSIS — J989 Respiratory disorder, unspecified: Secondary | ICD-10-CM | POA: Diagnosis not present

## 2018-04-15 DIAGNOSIS — R197 Diarrhea, unspecified: Secondary | ICD-10-CM | POA: Diagnosis not present

## 2018-04-15 DIAGNOSIS — A4152 Sepsis due to Pseudomonas: Secondary | ICD-10-CM | POA: Diagnosis not present

## 2018-04-15 DIAGNOSIS — K920 Hematemesis: Secondary | ICD-10-CM | POA: Diagnosis not present

## 2018-04-15 DIAGNOSIS — G372 Central pontine myelinolysis: Secondary | ICD-10-CM | POA: Diagnosis not present

## 2018-04-15 DIAGNOSIS — R4701 Aphasia: Secondary | ICD-10-CM | POA: Diagnosis not present

## 2018-04-15 DIAGNOSIS — R278 Other lack of coordination: Secondary | ICD-10-CM | POA: Diagnosis not present

## 2018-04-15 DIAGNOSIS — J189 Pneumonia, unspecified organism: Secondary | ICD-10-CM | POA: Diagnosis not present

## 2018-04-15 DIAGNOSIS — M2550 Pain in unspecified joint: Secondary | ICD-10-CM | POA: Diagnosis not present

## 2018-04-15 DIAGNOSIS — R6520 Severe sepsis without septic shock: Secondary | ICD-10-CM | POA: Diagnosis not present

## 2018-04-15 DIAGNOSIS — J9601 Acute respiratory failure with hypoxia: Secondary | ICD-10-CM | POA: Diagnosis not present

## 2018-04-15 DIAGNOSIS — J984 Other disorders of lung: Secondary | ICD-10-CM | POA: Diagnosis not present

## 2018-04-15 DIAGNOSIS — K9423 Gastrostomy malfunction: Secondary | ICD-10-CM | POA: Diagnosis not present

## 2018-04-15 DIAGNOSIS — Z452 Encounter for adjustment and management of vascular access device: Secondary | ICD-10-CM | POA: Diagnosis not present

## 2018-04-15 DIAGNOSIS — M24532 Contracture, left wrist: Secondary | ICD-10-CM | POA: Diagnosis not present

## 2018-04-15 DIAGNOSIS — G20 Parkinson's disease: Secondary | ICD-10-CM | POA: Diagnosis not present

## 2018-04-15 DIAGNOSIS — K922 Gastrointestinal hemorrhage, unspecified: Secondary | ICD-10-CM | POA: Diagnosis not present

## 2018-04-15 DIAGNOSIS — E2740 Unspecified adrenocortical insufficiency: Secondary | ICD-10-CM | POA: Diagnosis not present

## 2018-04-15 DIAGNOSIS — E119 Type 2 diabetes mellitus without complications: Secondary | ICD-10-CM | POA: Diagnosis not present

## 2018-04-15 DIAGNOSIS — N281 Cyst of kidney, acquired: Secondary | ICD-10-CM | POA: Diagnosis not present

## 2018-04-18 MED ORDER — GENERIC EXTERNAL MEDICATION
1.00 | Status: DC
Start: 2018-04-18 — End: 2018-04-18

## 2018-04-18 MED ORDER — BENZONATATE 100 MG PO CAPS
100.00 | ORAL_CAPSULE | ORAL | Status: DC
Start: ? — End: 2018-04-18

## 2018-04-18 MED ORDER — POLYVINYL ALCOHOL 1.4 % OP SOLN
1.00 | OPHTHALMIC | Status: DC
Start: ? — End: 2018-04-18

## 2018-04-18 MED ORDER — INSULIN LISPRO 100 UNIT/ML ~~LOC~~ SOLN
0.00 | SUBCUTANEOUS | Status: DC
Start: 2018-04-18 — End: 2018-04-18

## 2018-04-18 MED ORDER — FOLIC ACID 1 MG PO TABS
1.00 | ORAL_TABLET | ORAL | Status: DC
Start: 2018-04-19 — End: 2018-04-18

## 2018-04-18 MED ORDER — GENERIC EXTERNAL MEDICATION
0.00 | Status: DC
Start: ? — End: 2018-04-18

## 2018-04-18 MED ORDER — HYDROCORTISONE 1 % EX CREA
1.00 | TOPICAL_CREAM | CUTANEOUS | Status: DC
Start: ? — End: 2018-04-18

## 2018-04-18 MED ORDER — BISACODYL 5 MG PO TBEC
10.00 | DELAYED_RELEASE_TABLET | ORAL | Status: DC
Start: ? — End: 2018-04-18

## 2018-04-18 MED ORDER — GENERIC EXTERNAL MEDICATION
15.00 | Status: DC
Start: 2018-04-18 — End: 2018-04-18

## 2018-04-18 MED ORDER — FERROUS SULFATE 300 (60 FE) MG/5ML PO SYRP
300.00 | ORAL_SOLUTION | ORAL | Status: DC
Start: 2018-04-19 — End: 2018-04-18

## 2018-04-18 MED ORDER — ACETAMINOPHEN 325 MG PO TABS
650.00 | ORAL_TABLET | ORAL | Status: DC
Start: ? — End: 2018-04-18

## 2018-04-18 MED ORDER — ONDANSETRON HCL 4 MG/2ML IJ SOLN
4.00 | INTRAMUSCULAR | Status: DC
Start: ? — End: 2018-04-18

## 2018-04-18 MED ORDER — AMANTADINE HCL 100 MG PO CAPS
100.00 | ORAL_CAPSULE | ORAL | Status: DC
Start: 2018-04-18 — End: 2018-04-18

## 2018-04-18 MED ORDER — GENERIC EXTERNAL MEDICATION
2.00 | Status: DC
Start: 2018-04-18 — End: 2018-04-18

## 2018-04-18 MED ORDER — MAGNESIUM OXIDE 400 MG PO TABS
400.00 | ORAL_TABLET | ORAL | Status: DC
Start: 2018-04-18 — End: 2018-04-18

## 2018-04-18 MED ORDER — PHENYTOIN 125 MG/5ML PO SUSP
100.00 | ORAL | Status: DC
Start: 2018-04-18 — End: 2018-04-18

## 2018-04-18 MED ORDER — METOCLOPRAMIDE HCL 10 MG PO TABS
15.00 | ORAL_TABLET | ORAL | Status: DC
Start: 2018-04-18 — End: 2018-04-18

## 2018-04-18 MED ORDER — BACLOFEN 10 MG PO TABS
5.00 | ORAL_TABLET | ORAL | Status: DC
Start: 2018-04-18 — End: 2018-04-18

## 2018-04-18 MED ORDER — PANTOPRAZOLE 40 MG/20 ML SUSPENSION
40.00 | PACK | ORAL | Status: DC
Start: 2018-04-19 — End: 2018-04-18

## 2018-04-18 MED ORDER — PROPOFOL 100 MG/10ML IV EMUL
.00 | INTRAVENOUS | Status: DC
Start: ? — End: 2018-04-18

## 2018-04-18 MED ORDER — GENERIC EXTERNAL MEDICATION
Status: DC
Start: ? — End: 2018-04-18

## 2018-04-18 MED ORDER — GLUCAGON HCL RDNA (DIAGNOSTIC) 1 MG IJ SOLR
1.00 | INTRAMUSCULAR | Status: DC
Start: ? — End: 2018-04-18

## 2018-04-18 MED ORDER — PEG 3350 17 GM/SCOOP PO POWD
17.00 | ORAL | Status: DC
Start: 2018-04-18 — End: 2018-04-18

## 2018-04-18 MED ORDER — ALUMINUM-MAGNESIUM-SIMETHICONE 200-200-20 MG/5ML PO SUSP
30.00 | ORAL | Status: DC
Start: ? — End: 2018-04-18

## 2018-04-18 MED ORDER — DEXTROSE 10 % IV SOLN
125.00 | INTRAVENOUS | Status: DC
Start: ? — End: 2018-04-18

## 2018-04-18 MED ORDER — GENERIC EXTERNAL MEDICATION
.00 | Status: DC
Start: ? — End: 2018-04-18

## 2018-04-18 MED ORDER — GLUCOSE 40 % PO GEL
15.00 | ORAL | Status: DC
Start: ? — End: 2018-04-18

## 2018-04-18 MED ORDER — HYDROCORTISONE 10 MG PO TABS
20.00 | ORAL_TABLET | ORAL | Status: DC
Start: 2018-04-18 — End: 2018-04-18

## 2018-04-29 DIAGNOSIS — I509 Heart failure, unspecified: Secondary | ICD-10-CM | POA: Diagnosis not present

## 2018-04-29 DIAGNOSIS — Z431 Encounter for attention to gastrostomy: Secondary | ICD-10-CM | POA: Diagnosis not present

## 2018-04-29 DIAGNOSIS — E1143 Type 2 diabetes mellitus with diabetic autonomic (poly)neuropathy: Secondary | ICD-10-CM | POA: Diagnosis not present

## 2018-04-29 DIAGNOSIS — R112 Nausea with vomiting, unspecified: Secondary | ICD-10-CM | POA: Diagnosis not present

## 2018-04-29 DIAGNOSIS — J189 Pneumonia, unspecified organism: Secondary | ICD-10-CM | POA: Diagnosis not present

## 2018-04-29 DIAGNOSIS — I69398 Other sequelae of cerebral infarction: Secondary | ICD-10-CM | POA: Diagnosis not present

## 2018-04-29 DIAGNOSIS — M24532 Contracture, left wrist: Secondary | ICD-10-CM | POA: Diagnosis not present

## 2018-04-29 DIAGNOSIS — J9602 Acute respiratory failure with hypercapnia: Secondary | ICD-10-CM | POA: Diagnosis not present

## 2018-04-29 DIAGNOSIS — I10 Essential (primary) hypertension: Secondary | ICD-10-CM | POA: Diagnosis not present

## 2018-04-29 DIAGNOSIS — G8250 Quadriplegia, unspecified: Secondary | ICD-10-CM | POA: Diagnosis not present

## 2018-04-29 DIAGNOSIS — E2740 Unspecified adrenocortical insufficiency: Secondary | ICD-10-CM | POA: Diagnosis not present

## 2018-04-29 DIAGNOSIS — R0689 Other abnormalities of breathing: Secondary | ICD-10-CM | POA: Diagnosis not present

## 2018-04-29 DIAGNOSIS — R569 Unspecified convulsions: Secondary | ICD-10-CM | POA: Diagnosis not present

## 2018-04-29 DIAGNOSIS — I69891 Dysphagia following other cerebrovascular disease: Secondary | ICD-10-CM | POA: Diagnosis not present

## 2018-04-29 DIAGNOSIS — E8779 Other fluid overload: Secondary | ICD-10-CM | POA: Diagnosis not present

## 2018-04-29 DIAGNOSIS — E119 Type 2 diabetes mellitus without complications: Secondary | ICD-10-CM | POA: Diagnosis not present

## 2018-04-29 DIAGNOSIS — D649 Anemia, unspecified: Secondary | ICD-10-CM | POA: Diagnosis not present

## 2018-04-29 DIAGNOSIS — G20 Parkinson's disease: Secondary | ICD-10-CM | POA: Diagnosis not present

## 2018-04-29 DIAGNOSIS — J690 Pneumonitis due to inhalation of food and vomit: Secondary | ICD-10-CM | POA: Diagnosis not present

## 2018-04-29 DIAGNOSIS — Z931 Gastrostomy status: Secondary | ICD-10-CM | POA: Diagnosis not present

## 2018-04-29 DIAGNOSIS — M6281 Muscle weakness (generalized): Secondary | ICD-10-CM | POA: Diagnosis not present

## 2018-04-29 DIAGNOSIS — K219 Gastro-esophageal reflux disease without esophagitis: Secondary | ICD-10-CM | POA: Diagnosis not present

## 2018-04-29 DIAGNOSIS — R1310 Dysphagia, unspecified: Secondary | ICD-10-CM | POA: Diagnosis not present

## 2018-04-29 DIAGNOSIS — R4701 Aphasia: Secondary | ICD-10-CM | POA: Diagnosis not present

## 2018-04-29 DIAGNOSIS — K3184 Gastroparesis: Secondary | ICD-10-CM | POA: Diagnosis not present

## 2018-04-29 DIAGNOSIS — M2550 Pain in unspecified joint: Secondary | ICD-10-CM | POA: Diagnosis not present

## 2018-04-29 DIAGNOSIS — I69828 Other speech and language deficits following other cerebrovascular disease: Secondary | ICD-10-CM | POA: Diagnosis not present

## 2018-04-29 DIAGNOSIS — R0602 Shortness of breath: Secondary | ICD-10-CM | POA: Diagnosis not present

## 2018-04-29 DIAGNOSIS — R1312 Dysphagia, oropharyngeal phase: Secondary | ICD-10-CM | POA: Diagnosis not present

## 2018-04-29 DIAGNOSIS — Z7401 Bed confinement status: Secondary | ICD-10-CM | POA: Diagnosis not present

## 2018-04-29 DIAGNOSIS — M24542 Contracture, left hand: Secondary | ICD-10-CM | POA: Diagnosis not present

## 2018-04-29 DIAGNOSIS — R278 Other lack of coordination: Secondary | ICD-10-CM | POA: Diagnosis not present

## 2018-04-29 DIAGNOSIS — J9601 Acute respiratory failure with hypoxia: Secondary | ICD-10-CM | POA: Diagnosis not present

## 2018-04-29 DIAGNOSIS — I69822 Dysarthria following other cerebrovascular disease: Secondary | ICD-10-CM | POA: Diagnosis not present

## 2018-04-29 DIAGNOSIS — D638 Anemia in other chronic diseases classified elsewhere: Secondary | ICD-10-CM | POA: Diagnosis not present

## 2018-04-29 DIAGNOSIS — R197 Diarrhea, unspecified: Secondary | ICD-10-CM | POA: Diagnosis not present

## 2018-04-30 ENCOUNTER — Non-Acute Institutional Stay (SKILLED_NURSING_FACILITY): Payer: Medicare Other | Admitting: Internal Medicine

## 2018-04-30 ENCOUNTER — Encounter: Payer: Self-pay | Admitting: Internal Medicine

## 2018-04-30 DIAGNOSIS — G20 Parkinson's disease: Secondary | ICD-10-CM

## 2018-04-30 DIAGNOSIS — E8779 Other fluid overload: Secondary | ICD-10-CM

## 2018-04-30 DIAGNOSIS — R569 Unspecified convulsions: Secondary | ICD-10-CM | POA: Diagnosis not present

## 2018-04-30 DIAGNOSIS — J9601 Acute respiratory failure with hypoxia: Secondary | ICD-10-CM | POA: Diagnosis not present

## 2018-04-30 DIAGNOSIS — Z931 Gastrostomy status: Secondary | ICD-10-CM | POA: Diagnosis not present

## 2018-04-30 DIAGNOSIS — J9602 Acute respiratory failure with hypercapnia: Secondary | ICD-10-CM

## 2018-04-30 DIAGNOSIS — J690 Pneumonitis due to inhalation of food and vomit: Secondary | ICD-10-CM

## 2018-04-30 DIAGNOSIS — R112 Nausea with vomiting, unspecified: Secondary | ICD-10-CM

## 2018-04-30 DIAGNOSIS — R1312 Dysphagia, oropharyngeal phase: Secondary | ICD-10-CM | POA: Diagnosis not present

## 2018-04-30 DIAGNOSIS — E2740 Unspecified adrenocortical insufficiency: Secondary | ICD-10-CM | POA: Diagnosis not present

## 2018-04-30 NOTE — Progress Notes (Signed)
:  Location:  [place] Room Number: [phone] Place of Service:  SNF (31)  [name], MD  Patient Care Team: [name], MD as PCP - General (Internal Medicine)  Extended Emergency Contact Information Primary Emergency Contact: Belcher,[name] Address: [address]          [place], [place] [address] [name] Phone: [phone] Mobile Phone: [phone] Relation: Mother Secondary Emergency Contact: Constance Haw [place] Phone: [phone] Relation: Brother Father: Marvelle, Caudill [place] Mobile Phone: [phone]     Allergies: Aspirin; Keflex [cephalexin]; Nsaids; and Penicillins  Chief Complaint  Patient presents with  . New Admit To SNF    Admit to [facility]    HPI: Patient is 63 y.o. male with history of central pontine melanosis, quadriplegia, seizures, depression, hyponatremia, Parkinson's disease who was sent from [facility] home for tachypnea and shortness of breath.  Patient was diagnosed with bronchitis/bronchopneumonia a day ago and was started on Omnicef.  Patient apparently worsened and was sent to the emergency department.  In the ED patient's blood pressure was 121/61, respiratory rate 19 heart rate 96 and O2 saturation 100%, temperature 102.8 hemoglobin 9.7 WBC 17.3 lactic acid 1.8.  Patient was admitted to [facility] from 1/19-2/2.  Patient was placed on vancomycin and meropenem which was changed to vancomycin and Levaquin to cover for Pseudomonas.  Chest x-ray showed left lower lobe infiltrate with diffuse interstitial opacities.  1/20 patient was transferred to ICU, where high flow O2 was attempted and failed, chest x-ray worsened, desaturations worsened and patient was intubated, briefly on phenylephrine drip patient had trouble with clogged PEG tube on 1/23 and at that time Solu-Cortef was given in addition to patient's baseline  hydrocortisone.  Patient had trouble with emesis and tube feeds.  1/25 patient had a CT which showed bilateral multifocal consolidations left greater than right and bilateral pleural effusions.  Lasix drip was started and patient had a 5 L output and patient improved.  On 1/27 chest x-ray improved and on 1/28 patient was extubated, 1/30 patient has final day of his 10-day course of antibiotics.  Patient is admitted to skilled nursing facility for OT/PT and for residential care.  While at skilled nursing facility patient will be followed for adrenal insufficiency treated with hydrocortisone, Parkinson's disease treated with Symmetrel and seizure disorder treated with Dilantin.  Past Medical History:  Diagnosis Date  . Acute encephalopathy 01/02/2014  . Adrenal insufficiency ([place]) 11/08/2011  . Adrenocortical insufficiency ([place])   . Allergic rhinitis   . Anemia    Iron deficient  . Aphasia 06/19/2013  . Cataract   . Clostridium difficile colitis 02/2014  . Constipation   . Contracture of joint of left hand   . Depression   . Diabetic gastroparesis ([place]) 11/08/2011  . Dysphagia 06/19/2013  . Gastroesophageal reflux disease 03/17/2015  . GERD (gastroesophageal reflux disease)   . Glucocorticoid deficiency ([place]) 08/10/2012  . History of colon polyps 05/22/2015  . Ileus ([place]) 11/08/2011  . Impaired verbal communication   . Iron deficiency anemia 08/10/2012  . Lung nodule 05/22/2015  . Nontraumatic chronic subdural hemorrhage ([place])   . Parkinson disease ([place])   . Parkinson's disease ([place])   . PEG (percutaneous endoscopic gastrostomy) status ([place]) 10/04/2013  . Pneumonia 07/2014   aspiration pneumonia  . Pyelonephritis   . Quadriplegia ([place])   . S/P percutaneous endoscopic gastrostomy (PEG)  tube placement ([place])   . Seizures ([place])    last occurance 2015  . SIRS (systemic inflammatory response syndrome) ([place]) 08/06/2014  . Subdural hematoma ([place]) 01/02/2014  . Type 2 diabetes mellitus without  complication ([place]) 42/68/3419  . UTI (urinary tract infection) 07/2014  . Vitamin D deficiency 10/16/2016  . Wheelchair bound     Past Surgical History:  Procedure Laterality Date  . COLONOSCOPY    . ESOPHAGOGASTRODUODENOSCOPY    . PEG TUBE PLACEMENT    . PERIPHERALLY INSERTED CENTRAL CATHETER INSERTION    . TONSILLECTOMY  1962    Allergies as of 04/30/2018      Reactions   Aspirin Other (See Comments)   Unknown reaction; "blood doesn't clog too well" per mother.    Keflex [cephalexin]    Rash   Nsaids Other (See Comments)   Unknown reaction   Penicillins Hives, Other (See Comments)   Unknown reaction.  Pt has tolerated cephalosporins in the past.      Medication List       Accurate as of April 30, 2018 12:57 PM. Always use your most recent med list.        acetaminophen 325 MG tablet Commonly known as:  TYLENOL Place 650 mg into feeding tube every 6 (six) hours as needed. Fever > 101   amantadine 50 MG/5ML solution Commonly known as:  SYMMETREL Take 100 mg by mouth. GIVE 10 CC (100 MG) BY PEG BID   amoxicillin-clavulanate 875-125 MG tablet Commonly known as:  AUGMENTIN 1 tablet. administer 1 tab via gtube bid x 7 days for bronchitis   Baclofen 5 MG Tabs Take by mouth. Give 1 tablet by Per Gtube route 4 times daily   bisacodyl 10 MG suppository Commonly known as:  DULCOLAX Place 10 mg rectally daily as needed for moderate constipation. If constipation not relieved by milk of magnesia give one 10 mg suppository in 24hours   CETAPHIL GENTLE CLEANSER Liqd Apply to face and neck with water and then pat dry daily.   Cholecalciferol 5000 UNIT/ML Liqd Take by mouth. 10 mls by Per G Tube route once a week. Wednesdays   DILANTIN 125 MG/5ML suspension Generic drug:  phenytoin Place 100 mg into feeding tube 2 (two) times daily.   feeding supplement (JEVITY 1.5 CAL) Liqd Place into feeding tube. Initiate Jevity 1.5 cal @@ 58 ml/hr continuous x 20 hr via peg ( hold 1  hr pre/post phenton administration)   ferrous sulfate 220 (44 Fe) MG/5ML solution Take 220 mg by mouth. 7.5 ML VIA TUBE ONCE DAILY FOR ANEMIA   folic acid 1 MG tablet Commonly known as:  FOLVITE 1 mg by PEG Tube route daily.   hydrocortisone 20 MG tablet Commonly known as:  CORTEF Take 20 mg by mouth. Give 1 tablet via per tube twice daily   loratadine 10 MG tablet Commonly known as:  CLARITIN 10 mg by PEG Tube route daily.   magnesium hydroxide 400 MG/5ML suspension Commonly known as:  MILK OF MAGNESIA Take 30 mLs by mouth daily as needed for mild constipation.   metoCLOPramide 10 MG tablet Commonly known as:  REGLAN Take 15 mg by mouth. TAKE 1 TABLET VIA TUBE BEFORE MEALS AND AT BEDTIME   olopatadine 0.1 % ophthalmic solution Commonly known as:  PATANOL Place 1 drop into both eyes every morning.   pantoprazole sodium 40 mg/20 mL Pack Commonly known as:  PROTONIX Take by mouth 2 (two) times daily.   polyethylene glycol packet Commonly  known as:  MIRALAX / GLYCOLAX Place 17 g into feeding tube daily as needed.   polyvinyl alcohol 1.4 % ophthalmic solution Commonly known as:  LIQUIFILM TEARS Place 1 drop into both eyes daily at 6 (six) AM.   promethazine 25 MG tablet Commonly known as:  PHENERGAN Place 25 mg into feeding tube every 6 (six) hours as needed for nausea or vomiting.   RA SALINE ENEMA 19-7 GM/118ML Enem Place 1 each rectally as needed (for constipation).   simethicone 40 MG/0.6ML drops Commonly known as:  MYLICON Take 40 mg by mouth. Give 1.2 mls ( 80 mg total ) by G-tube twice daily   sterile water for irrigation Flush 246ml of water into peg tub every 4 hours       No orders of the defined types were placed in this encounter.   Immunization History  Administered Date(s) Administered  . Influenza Whole 12/31/2012  . Influenza-Unspecified 12/29/2011, 12/31/2012, 12/22/2014, 01/22/2016, 01/05/2017  . PPD Test 08/22/2008  .  Pneumococcal-Unspecified 06/23/2010, 06/23/2015    Social History   Tobacco Use  . Smoking status: Never Smoker  . Smokeless tobacco: Never Used  Substance Use Topics  . Alcohol use: No    Family history is   Family History  Problem Relation Age of Onset  . Hypothyroidism Mother   . Hypertension Mother   . Diabetes Father   . Hypertension Father   . Diabetes Paternal Grandfather       Review of Systems  DATA OBTAINED: from nursing GENERAL:  no fevers, fatigue, appetite changes SKIN: No itching, or rash EYES: No eye pain, redness, discharge EARS: No earache, tinnitus, change in hearing NOSE: No congestion, drainage or bleeding  MOUTH/THROAT: No mouth or tooth pain, No sore throat RESPIRATORY: No cough, wheezing, SOB CARDIAC: No chest pain, palpitations, lower extremity edema  GI: No abdominal pain, No N/V/D or constipation, No heartburn or reflux  GU: No dysuria, frequency or urgency, or incontinence  MUSCULOSKELETAL: No unrelieved bone/joint pain NEUROLOGIC: No headache, dizziness or focal weakness PSYCHIATRIC: No c/o anxiety or sadness   Vitals:   04/30/18 1254  BP: (!) 107/57  Pulse: 82  Resp: 18  Temp: 98.9 F (37.2 C)    SpO2 Readings from Last 1 Encounters:  04/13/18 95%   Body mass index is 22.89 kg/m.     Physical Exam  GENERAL APPEARANCE: Alert, nonconversant at baseline; no acute distress,but does not look well SKIN: No diaphoresis rash HEAD: Normocephalic, atraumatic  EYES: Conjunctiva/lids clear. Pupils round, reactive. EOMs intact.  EARS: External exam WNL, canals clear. Hearing grossly normal.  NOSE: No deformity or discharge.  MOUTH/THROAT: Lips w/o lesions  RESPIRATORY: Breathing is even, unlabored. Lung sounds are rhonchi CARDIOVASCULAR: Heart RRR no murmurs, rubs or gallops. No peripheral edema.   GASTROINTESTINAL: Abdomen is soft, non-tender, not distended w/ normal bowel sounds; PEG tube. GENITOURINARY: Bladder non tender, not  distended  MUSCULOSKELETAL: Wasting and contractures NEUROLOGIC: Quadriplegia with some movement of upper extremities PSYCHIATRIC: Mood and affect flat, which is not his baseline, no behavioral issues  Patient Active Problem List   Diagnosis Date Noted  . Acute blood loss anemia 12/19/2017  . Mental status change resolved 11/05/2017  . Hematemesis 10/29/2017  . Upper GI bleed 10/29/2017  . Erosive esophagitis 10/29/2017  . Pseudomonas urinary tract infection 10/29/2017  . Volume overload 10/29/2017  . Compression fracture of lumbar spine, non-traumatic, sequela 10/29/2017  . Cheyne-Stokes breathing 10/29/2017  . Hyperkalemia 10/26/2017  . Urinary tract infection  due to Proteus 09/21/2017  . Infection of PEG site ([place]) 09/21/2017  . Hydronephrosis with renal and ureteral calculus obstruction 09/21/2017  . Left ureteral calculus 09/21/2017  . Flexion contractures 09/21/2017  . Vitamin D deficiency 10/16/2016  . Anemia of chronic disease   . GERD (gastroesophageal reflux disease)   . Clostridium difficile colitis   . Adrenocortical insufficiency ([place])   . Cataract 06/13/2016  . Constipation 06/13/2016  . Contracture of joint of left hand 06/13/2016  . Impaired verbal communication 06/13/2016  . Nontraumatic chronic subdural hemorrhage ([place]) 06/13/2016  . Nonverbal 06/13/2016  . Wheelchair bound 06/13/2016  . Allergic rhinitis 06/13/2016  . S/P percutaneous endoscopic gastrostomy (PEG) tube placement ([place]) 06/13/2016  . Itchy eyes 11/12/2015  . Benign neoplasm of colon 05/22/2015  . History of colon polyps 05/22/2015  . Lung nodule 05/22/2015  . Abnormal levels of other serum enzymes 05/22/2015  . Gastroesophageal reflux disease 03/17/2015  . Tinea cruris 09/10/2014  . Aspiration pneumonia ([place]) 08/15/2014  . Abscess, prostate 08/15/2014  . Pyelonephritis 08/06/2014  . Nausea & vomiting   . Fever presenting with conditions classified elsewhere 03/16/2014  . Altered mental  status 03/16/2014  . Acute bronchiolitis due to unspecified organism 03/11/2014  . Enteritis due to Clostridium difficile 03/11/2014  . Type 2 diabetes mellitus without complication ([place]) 40/12/2723  . Septic shock ([place]) 01/26/2014  . Sepsis ([place]) 01/25/2014  . Acute encephalopathy 01/02/2014  . Acute respiratory failure with hypercapnia ([place]) 01/02/2014  . Chronic anemia 01/02/2014  . Subdural hematoma ([place]) 01/02/2014  . Pain in thoracic spine 12/26/2013  . PEG (percutaneous endoscopic gastrostomy) status ([place]) 10/04/2013  . Rash and nonspecific skin eruption 07/08/2013  . Thrush 07/08/2013  . Heme positive stool 07/07/2013  . Severe sepsis ([place]) 06/23/2013  . E. coli pyelonephritis 06/22/2013  . SIRS (systemic inflammatory response syndrome) ([place]) 06/19/2013  . Leukocytosis 06/19/2013  . Bronchitis 06/19/2013  . Dysphagia 06/19/2013  . Aphasia 06/19/2013  . Other convulsions 08/10/2012  . Iron deficiency anemia 08/10/2012  . Glucocorticoid deficiency ([employer]) 08/10/2012  . Febrile illness, acute 06/15/2012  . UTI (lower urinary tract infection) 06/15/2012  . Hypokalemia 11/10/2011  . Diabetic gastroparesis ([place]) 11/08/2011  . Ileus ([place]) 11/08/2011  . Hyponatremia 11/08/2011  . Adrenal insufficiency ([place]) 11/08/2011  . Quadriplegia ([place]) 11/08/2011  . Parkinson disease ([place])   . Seizures ([place])   . Depression       Labs reviewed: Basic Metabolic Panel:    Component Value Date/Time   NA 135 (A) 04/13/2018   NA 135 09/18/2017   K 3.6 04/13/2018   K 4.0 09/18/2017   CL 108 06/26/2016 1424   CO2 26 06/26/2016 1424   GLUCOSE 107 (H) 06/26/2016 1424   BUN 15 04/13/2018   CREATININE 0.4 (A) 04/13/2018   CREATININE 0.49 09/18/2017   CALCIUM 8.6 09/18/2017   PROT 8.3 (H) 06/26/2016 1424   ALBUMIN 3.6 06/26/2016 1424   AST 22 05/26/2017   ALT 15 05/26/2017   ALKPHOS 106 05/26/2017   BILITOT 0.5 06/26/2016 1424   GFRNONAA >60 06/26/2016 1424   GFRAA >90 09/18/2017     Recent Labs    09/18/17 10/25/17 10/30/17 04/13/18  NA 135 137  --  135*  K 4.0 5.3 4.3 3.6  BUN 12 11  --  15  CREATININE 0.49 0.5*  --  0.4*  CALCIUM 8.6  --   --   --    Liver Function Tests: Recent Labs    05/26/17  AST 22  ALT 15  ALKPHOS 106   No results for input(s): LIPASE, AMYLASE in the last 8760 hours. No results for input(s): AMMONIA in the last 8760 hours. CBC: Recent Labs    09/18/17 10/25/17 04/13/18  WBC 7.5 9.7 11.3  HGB 11.0* 9.8* 9.2*  HCT 34* 30* 28*  PLT 298 331 246   Lipid No results for input(s): CHOL, HDL, LDLCALC, TRIG in the last 8760 hours.  Cardiac Enzymes: No results for input(s): CKTOTAL, CKMB, CKMBINDEX, TROPONINI in the last 8760 hours. BNP: No results for input(s): BNP in the last 8760 hours. Lab Results  Component Value Date   MICROALBUR 10.4 12/01/2016   Lab Results  Component Value Date   HGBA1C 5.0 03/24/2017   Lab Results  Component Value Date   TSH 1.78 09/13/2016   Lab Results  Component Value Date   BTDVVOHY07 371 01/29/2014   No results found for: FOLATE Lab Results  Component Value Date   IRON 48 01/29/2014   TIBC 125 (L) 01/29/2014    Imaging and Procedures obtained prior to SNF admission: Dg Abd Acute W/chest  Result Date: 06/26/2016 CLINICAL DATA:  Nausea and vomiting. Parkinson disease. Gastrostomy tube feeding. Quadriplegia. EXAM: DG ABDOMEN ACUTE W/ 1V CHEST COMPARISON:  01/11/2016 chest radiograph. 11/26/2014 CT abdomen/pelvis. FINDINGS: Stable cardiomediastinal silhouette with normal heart size and aortic atherosclerosis. No pneumothorax. No pleural effusion. Slightly low lung volumes. No pulmonary edema. No acute consolidative airspace disease. Percutaneous gastrostomy tube is seen terminating in the left upper quadrant of the abdomen. No disproportionately dilated small bowel loops or significant air-fluid levels . Moderate colorectal stool volume. No evidence of pneumatosis or pneumoperitoneum. No  radiopaque urolithiasis. IMPRESSION: 1. No active disease in the chest . 2. Percutaneous gastrostomy tube terminates in the left upper quadrant of the abdomen. No evidence of free intraperitoneal air. 3. Nonobstructive bowel gas pattern . 4. Moderate colorectal stool volume. Electronically Signed   By: Ilona Sorrel M.D.   On: 06/26/2016 14:57     Not all labs, radiology exams or other studies done during hospitalization come through on my EPIC note; however they are reviewed by me.    Assessment and Plan  Acute respiratory failure with hypoxia and hypercapnia/H CAP/acute fluid overload- chest x-ray showed focal opacity in the left base and diffuse interstitial opacities described as groundglass; patient was admitted to the medical floor on 1/19 and transferred to the ICU on 1/20 where high flow O2 was attempted and failed, chest x-ray worsened, desaturations worsened and patient was intubated; briefly on phenylephrine drip; CT on 1/25 showed bilateral multifocal consolidation left greater than right with bilateral pleural effusions; on 1/25 Lasix drip was started and patient had 5 L output and drip was DC'd after 24 hours and Lasix 20 to 40 mg twice daily as needed was continued/127 chest x-ray improved both interstitial and airspace disease left greater than right and patient was extubated on 1/28; patient had a final day of antibiotic for 10-day course on 1/30 SNF- patient admitted for OT/PT and residential care; patient's O2 saturation is 96 to 90% on room air  Adrenal insufficiency- patient received 1 dose of stress steroids with local secondary to low cortisol on 1/23 SNF- patient does not look well, and he does look like this before when he needed more cortisone; have ordered hydrocortisone 20 mg twice daily to be increased to 30 mg twice daily for a week and a stress dose of Solu-Cortef 50 mg IM now  Dysphasia-emesis-  problems with PEG tube-patient has had multiple problems with his PEG tube;  14 French PEG was clogged and was removed and replaced with a 16 French tube; patient had vomiting and tube feeding was held once and then once again several days later and Reglan was adjusted, PEG tube was clogged again and declogged and apparently began working again and patient's emesis stopped SNF- we will continue Reglan 5 mg per tube to 8 hours  Parkinson's disease SNF- continue Symmetrel 100 mg per tube twice daily  Seizure disorder continue Dilantin 100 mg per tube twice daily   Time spent greater than 45 minutes;> 50% of time with patient was spent reviewing records, labs, tests and studies, counseling and developing plan of care  Webb Silversmith D. [name], MD

## 2018-05-03 LAB — CBC AND DIFFERENTIAL
HCT: 29 — AB (ref 41–53)
Hemoglobin: 9.3 — AB (ref 13.5–17.5)
Platelets: 411 — AB (ref 150–399)
WBC: 8

## 2018-05-03 LAB — BASIC METABOLIC PANEL
BUN: 14 (ref 4–21)
Creatinine: 0.4 — AB (ref 0.6–1.3)
GLUCOSE: 101
Potassium: 4.2 (ref 3.4–5.3)
Sodium: 137 (ref 137–147)

## 2018-05-28 ENCOUNTER — Non-Acute Institutional Stay (SKILLED_NURSING_FACILITY): Payer: Medicare Other | Admitting: Internal Medicine

## 2018-05-28 ENCOUNTER — Encounter: Payer: Self-pay | Admitting: Internal Medicine

## 2018-05-28 DIAGNOSIS — R1312 Dysphagia, oropharyngeal phase: Secondary | ICD-10-CM | POA: Diagnosis not present

## 2018-05-28 DIAGNOSIS — K3184 Gastroparesis: Secondary | ICD-10-CM

## 2018-05-28 DIAGNOSIS — E1143 Type 2 diabetes mellitus with diabetic autonomic (poly)neuropathy: Secondary | ICD-10-CM

## 2018-05-28 DIAGNOSIS — K219 Gastro-esophageal reflux disease without esophagitis: Secondary | ICD-10-CM

## 2018-05-28 NOTE — Progress Notes (Signed)
Location:  [facility] Room Number: 214D Place of Service:  SNF [phone])  [name]. [name] Coil, MD  Patient Care Team: [name], MD as PCP - General (Internal Medicine)  Extended Emergency Contact Information Primary Emergency Contact: Crow,[name] Address: [address]          [place], [place] [address] [name] Phone: [phone] Mobile Phone: [phone] Relation: Mother Secondary Emergency Contact: Constance Haw [place] Phone: [phone] Relation: Brother Father: Denilson, [place] Mobile Phone: [phone]    Allergies: Aspirin; Keflex [cephalexin]; Nsaids; and Penicillins  Chief Complaint  Patient presents with  . Medical Management of Chronic Issues    Routine visit    HPI: Patient is 63 y.o. male who is being seen for routine issues of dysphasia, diabetic gastroparesis, and GERD.  Past Medical History:  Diagnosis Date  . Acute encephalopathy 01/02/2014  . Adrenal insufficiency ([place]) 11/08/2011  . Adrenocortical insufficiency ([place])   . Allergic rhinitis   . Anemia    Iron deficient  . Aphasia 06/19/2013  . Cataract   . Clostridium difficile colitis 02/2014  . Constipation   . Contracture of joint of left hand   . Depression   . Diabetic gastroparesis ([place]) 11/08/2011  . Dysphagia 06/19/2013  . Gastroesophageal reflux disease 03/17/2015  . GERD (gastroesophageal reflux disease)   . Glucocorticoid deficiency ([place]) 08/10/2012  . History of colon polyps 05/22/2015  . Ileus ([place]) 11/08/2011  . Impaired verbal communication   . Iron deficiency anemia 08/10/2012  . Lung nodule 05/22/2015  . Nontraumatic chronic subdural hemorrhage ([place])   . Parkinson disease ([place])   . Parkinson's disease ([place])   . PEG (percutaneous endoscopic gastrostomy) status ([place]) 10/04/2013  . Pneumonia 07/2014   aspiration pneumonia  . Pyelonephritis   . Quadriplegia ([place])   .  S/P percutaneous endoscopic gastrostomy (PEG) tube placement ([place])   . Seizures ([place])    last occurance 2015  . SIRS (systemic inflammatory response syndrome) ([place]) 08/06/2014  . Subdural hematoma ([place]) 01/02/2014  . Type 2 diabetes mellitus without complication ([place]) 92/42/6834  . UTI (urinary tract infection) 07/2014  . Vitamin D deficiency 10/16/2016  . Wheelchair bound     Past Surgical History:  Procedure Laterality Date  . COLONOSCOPY    . ESOPHAGOGASTRODUODENOSCOPY    . PEG TUBE PLACEMENT    . PERIPHERALLY INSERTED CENTRAL CATHETER INSERTION    . TONSILLECTOMY  1962    Allergies as of 05/28/2018      Reactions   Aspirin Other (See Comments)   Unknown reaction; "blood doesn't clog too well" per mother.    Keflex [cephalexin]    Rash   Nsaids Other (See Comments)   Unknown reaction   Penicillins Hives, Other (See Comments)   Unknown reaction.  Pt has tolerated cephalosporins in the past.      Medication List       Accurate as of May 28, 2018 11:59 PM. Always use your most recent med list.        amantadine 50 MG/5ML solution Commonly known as:  SYMMETREL Take 100 mg by mouth. GIVE 10 CC (100 MG) BY PEG BID   Baclofen 5 MG Tabs Take by mouth. Give 1 tablet by Per Gtube route 4 times daily   bisacodyl 10 MG suppository Commonly known as:  DULCOLAX Place 10 mg rectally daily as needed for moderate constipation. If constipation not relieved by  milk of magnesia give one 10 mg suppository in 24hours   CETAPHIL GENTLE CLEANSER Liqd Apply to face and neck with water and then pat dry daily.   DILANTIN 125 MG/5ML suspension Generic drug:  phenytoin Place 100 mg into feeding tube 2 (two) times daily.   feeding supplement (JEVITY 1.5 CAL) Liqd Place into feeding tube. Initiate Jevity 1.5 cal @@ 58 ml/hr continuous x 20 hr via peg ( hold 1 hr pre/post phenton administration)   folic acid 1 MG tablet Commonly known as:  FOLVITE 1 mg by PEG Tube route daily.     geriatric multivitamins-minerals Liqd Place 15 mLs into feeding tube daily.   hydrocortisone 20 MG tablet Commonly known as:  CORTEF Take 20 mg by mouth. Give 1 tablet via per tube twice daily   loratadine 10 MG tablet Commonly known as:  CLARITIN 10 mg by PEG Tube route daily.   magnesium hydroxide 400 MG/5ML suspension Commonly known as:  MILK OF MAGNESIA Take 30 mLs by mouth daily as needed for mild constipation.   magnesium oxide 400 MG tablet Commonly known as:  MAG-OX Take 400 mg by mouth daily.   metoCLOPramide 10 MG tablet Commonly known as:  REGLAN Take 15 mg by mouth. TAKE 1 TABLET VIA TUBE BEFORE MEALS AND AT BEDTIME   olopatadine 0.1 % ophthalmic solution Commonly known as:  PATANOL Place 1 drop into both eyes every morning.   pantoprazole sodium 40 mg/20 mL Pack Commonly known as:  PROTONIX Take by mouth 2 (two) times daily.   phosphorus 155-852-130 MG tablet Commonly known as:  K PHOS NEUTRAL 3 (three) times daily.   polyethylene glycol packet Commonly known as:  MIRALAX / GLYCOLAX Place 17 g into feeding tube daily as needed.   polyvinyl alcohol 1.4 % ophthalmic solution Commonly known as:  LIQUIFILM TEARS Place 1 drop into both eyes daily at 6 (six) AM.   psyllium 58.6 % packet Commonly known as:  METAMUCIL 1 packet daily.   RA SALINE ENEMA 19-7 GM/118ML Enem Place 1 each rectally as needed (for constipation).   saccharomyces boulardii 250 MG capsule Commonly known as:  FLORASTOR 250 mg 2 (two) times daily.   simethicone 40 MG/0.6ML drops Commonly known as:  MYLICON Take 40 mg by mouth. Give 1.2 mls ( 80 mg total ) by G-tube twice daily   sterile water for irrigation Flush 227ml of water into peg tub every 4 hours       No orders of the defined types were placed in this encounter.   Immunization History  Administered Date(s) Administered  . Influenza Whole 12/31/2012  . Influenza-Unspecified 12/29/2011, 12/31/2012, 12/22/2014,  01/22/2016, 01/05/2017  . PPD Test 08/22/2008  . Pneumococcal-Unspecified 06/23/2010, 06/23/2015    Social History   Tobacco Use  . Smoking status: Never Smoker  . Smokeless tobacco: Never Used  Substance Use Topics  . Alcohol use: No    Review of Systems  DATA OBTAINED: from nurse GENERAL:  no fevers, fatigue, appetite changes SKIN: No itching, rash HEENT: No complaint RESPIRATORY: No cough, wheezing, SOB CARDIAC: No chest pain, palpitations, lower extremity edema  GI: No abdominal pain, No N/V/D or constipation, No heartburn or reflux  GU: No dysuria, frequency or urgency, or incontinence  MUSCULOSKELETAL: No unrelieved bone/joint pain NEUROLOGIC: No headache, dizziness  PSYCHIATRIC: No overt anxiety or sadness  Vitals:   05/28/18 1547  BP: 118/72  Pulse: 70  Resp: 18  Temp: 98 F (36.7 C)   Body mass  index is 19.49 kg/m. Physical Exam  GENERAL APPEARANCE: Alert, non-conversant, No acute distress  SKIN: No diaphoresis rash HEENT: Unremarkable RESPIRATORY: Breathing is even, unlabored. Lung sounds are clear   CARDIOVASCULAR: Heart RRR no murmurs, rubs or gallops. No peripheral edema  GASTROINTESTINAL: Abdomen is soft, non-tender, not distended w/ normal bowel sounds; PEG tube.  GENITOURINARY: Bladder non tender, not distended  MUSCULOSKELETAL: Wasting and contractures NEUROLOGIC: Functional quadriplegia- some movement upper extremities, dysphasia, but can see here and understand and point to his word board PSYCHIATRIC: Mood and affect appropriate to situation, no behavioral issues  Patient Active Problem List   Diagnosis Date Noted  . Acute blood loss anemia 12/19/2017  . Mental status change resolved 11/05/2017  . Hematemesis 10/29/2017  . Upper GI bleed 10/29/2017  . Erosive esophagitis 10/29/2017  . Pseudomonas urinary tract infection 10/29/2017  . Volume overload 10/29/2017  . Compression fracture of lumbar spine, non-traumatic, sequela 10/29/2017  .  Cheyne-Stokes breathing 10/29/2017  . Hyperkalemia 10/26/2017  . Urinary tract infection due to Proteus 09/21/2017  . Infection of PEG site ([place]) 09/21/2017  . Hydronephrosis with renal and ureteral calculus obstruction 09/21/2017  . Left ureteral calculus 09/21/2017  . Flexion contractures 09/21/2017  . Vitamin D deficiency 10/16/2016  . Anemia of chronic disease   . GERD (gastroesophageal reflux disease)   . Clostridium difficile colitis   . Adrenocortical insufficiency ([place])   . Cataract 06/13/2016  . Constipation 06/13/2016  . Contracture of joint of left hand 06/13/2016  . Impaired verbal communication 06/13/2016  . Nontraumatic chronic subdural hemorrhage ([place]) 06/13/2016  . Nonverbal 06/13/2016  . Wheelchair bound 06/13/2016  . Allergic rhinitis 06/13/2016  . S/P percutaneous endoscopic gastrostomy (PEG) tube placement ([place]) 06/13/2016  . Itchy eyes 11/12/2015  . Benign neoplasm of colon 05/22/2015  . History of colon polyps 05/22/2015  . Lung nodule 05/22/2015  . Abnormal levels of other serum enzymes 05/22/2015  . Gastroesophageal reflux disease 03/17/2015  . Tinea cruris 09/10/2014  . Aspiration pneumonia ([place]) 08/15/2014  . Abscess, prostate 08/15/2014  . Pyelonephritis 08/06/2014  . Nausea & vomiting   . Fever presenting with conditions classified elsewhere 03/16/2014  . Altered mental status 03/16/2014  . Acute bronchiolitis due to unspecified organism 03/11/2014  . Enteritis due to Clostridium difficile 03/11/2014  . Type 2 diabetes mellitus without complication ([place]) 63/87/5643  . Septic shock ([place]) 01/26/2014  . Sepsis ([place]) 01/25/2014  . Acute encephalopathy 01/02/2014  . Acute respiratory failure with hypoxia and hypercapnia ([place]) 01/02/2014  . Chronic anemia 01/02/2014  . Subdural hematoma ([place]) 01/02/2014  . Pain in thoracic spine 12/26/2013  . PEG (percutaneous endoscopic gastrostomy) status ([place]) 10/04/2013  . Rash and nonspecific skin eruption  07/08/2013  . Thrush 07/08/2013  . Heme positive stool 07/07/2013  . Severe sepsis ([place]) 06/23/2013  . E. coli pyelonephritis 06/22/2013  . SIRS (systemic inflammatory response syndrome) ([place]) 06/19/2013  . Leukocytosis 06/19/2013  . Bronchitis 06/19/2013  . Dysphagia 06/19/2013  . Aphasia 06/19/2013  . Other convulsions 08/10/2012  . Iron deficiency anemia 08/10/2012  . Glucocorticoid deficiency ([place]) 08/10/2012  . Febrile illness, acute 06/15/2012  . UTI (lower urinary tract infection) 06/15/2012  . Hypokalemia 11/10/2011  . Diabetic gastroparesis ([place]) 11/08/2011  . Ileus ([place]) 11/08/2011  . Hyponatremia 11/08/2011  . Adrenal insufficiency ([place]) 11/08/2011  . Quadriplegia ([place]) 11/08/2011  . Parkinson disease ([place])   . Seizures ([place])   . Depression     CMP     Component Value Date/Time  NA 137 05/03/2018   NA 135 09/18/2017   K 4.2 05/03/2018   K 4.0 09/18/2017   CL 108 06/26/2016 1424   CO2 26 06/26/2016 1424   GLUCOSE 107 (H) 06/26/2016 1424   BUN 14 05/03/2018   CREATININE 0.4 (A) 05/03/2018   CREATININE 0.49 09/18/2017   CALCIUM 8.6 09/18/2017   PROT 8.3 (H) 06/26/2016 1424   ALBUMIN 3.6 06/26/2016 1424   AST 22 05/26/2017   ALT 15 05/26/2017   ALKPHOS 106 05/26/2017   BILITOT 0.5 06/26/2016 1424   GFRNONAA >60 06/26/2016 1424   GFRAA >90 09/18/2017   Recent Labs    09/18/17 10/25/17 10/30/17 04/13/18 05/03/18  NA 135 137  --  135* 137  K 4.0 5.3 4.3 3.6 4.2  BUN 12 11  --  15 14  CREATININE 0.49 0.5*  --  0.4* 0.4*  CALCIUM 8.6  --   --   --   --    No results for input(s): AST, ALT, ALKPHOS, BILITOT, PROT, ALBUMIN in the last 8760 hours. Recent Labs    10/25/17 04/13/18 05/03/18  WBC 9.7 11.3 8.0  HGB 9.8* 9.2* 9.3*  HCT 30* 28* 29*  PLT 331 246 411*   No results for input(s): CHOL, LDLCALC, TRIG in the last 8760 hours.  Invalid input(s): HCL Lab Results  Component Value Date   MICROALBUR 10.4 12/01/2016   Lab Results  Component Value  Date   TSH 1.78 09/13/2016   Lab Results  Component Value Date   HGBA1C 5.0 03/24/2017   Lab Results  Component Value Date   CHOL 127 09/13/2016   HDL 55 09/13/2016   LDLCALC 70 09/13/2016   TRIG 79 09/13/2016    Significant Diagnostic Results in last 30 days:  No results found.  Assessment and Plan  Dysphagia Permanent; no problem with tube feeds recently or feeding tube.  Diabetic gastroparesis Continues with few problems of vomiting or reflux; continue Reglan 10 mg per tube before meals and nightly  Gastroesophageal reflux disease No reported problems; continue Protonix 40 mg per tube twice daily     Kharlie Bring D. [name] Coil, MD

## 2018-05-30 ENCOUNTER — Encounter: Payer: Self-pay | Admitting: Internal Medicine

## 2018-05-30 NOTE — Assessment & Plan Note (Signed)
Continues with few problems of vomiting or reflux; continue Reglan 10 mg per tube before meals and nightly

## 2018-05-30 NOTE — Assessment & Plan Note (Signed)
Permanent; no problem with tube feeds recently or feeding tube.

## 2018-05-30 NOTE — Assessment & Plan Note (Signed)
No reported problems; continue Protonix 40 mg per tube twice daily

## 2018-06-01 DIAGNOSIS — R195 Other fecal abnormalities: Secondary | ICD-10-CM | POA: Diagnosis not present

## 2018-06-04 ENCOUNTER — Non-Acute Institutional Stay (SKILLED_NURSING_FACILITY): Payer: Medicare Other | Admitting: Internal Medicine

## 2018-06-04 ENCOUNTER — Encounter: Payer: Self-pay | Admitting: Internal Medicine

## 2018-06-04 DIAGNOSIS — L0201 Cutaneous abscess of face: Secondary | ICD-10-CM

## 2018-06-04 DIAGNOSIS — R220 Localized swelling, mass and lump, head: Secondary | ICD-10-CM

## 2018-06-04 NOTE — Progress Notes (Signed)
:  Location:  [facility] Room Number: [id number] Place of Service:  SNF (31)  [name]. [name], MD  Patient Care Team: [name], MD as PCP - General (Internal Medicine)  Extended Emergency Contact Information Primary Emergency Contact: Jennings,[name] Address: [address]          [place], [place] [address] [name] Phone: [phone] Mobile Phone: [phone] Relation: Mother Secondary Emergency Contact: Constance Haw [place] Phone: [phone] Relation: Brother Father: Tarun, Patchell [place] Mobile Phone: [phone]     Allergies: Aspirin; Keflex [cephalexin]; Nsaids; and Penicillins  Chief Complaint  Patient presents with  . Acute Visit    HPI: Patient is 63 y.o. male who is being seen because he is developed a boil on his left chin apparently over the weekend, nothing was done on over the weekend in the week and nurses told the Monday day nurse who immediately told me.  Patient is a functional quadriplegic so he cannot speak per se he does have a large swelling left chin, appears tender to palpation.  Patient's lower lip is also swollen and inside his lower lip is a very large aphthous ulcer.  Patient has had no fever chills, colds or coughs.  Past Medical History:  Diagnosis Date  . Acute encephalopathy 01/02/2014  . Adrenal insufficiency ([place]) 11/08/2011  . Adrenocortical insufficiency ([place])   . Allergic rhinitis   . Anemia    Iron deficient  . Aphasia 06/19/2013  . Cataract   . Clostridium difficile colitis 02/2014  . Constipation   . Contracture of joint of left hand   . Depression   . Diabetic gastroparesis ([place]) 11/08/2011  . Dysphagia 06/19/2013  . Gastroesophageal reflux disease 03/17/2015  . GERD (gastroesophageal reflux disease)   . Glucocorticoid deficiency ([place]) 08/10/2012  . History of colon polyps 05/22/2015  . Ileus ([place]) 11/08/2011  . Impaired  verbal communication   . Iron deficiency anemia 08/10/2012  . Lung nodule 05/22/2015  . Nontraumatic chronic subdural hemorrhage ([place])   . Parkinson disease ([place])   . Parkinson's disease ([place])   . PEG (percutaneous endoscopic gastrostomy) status ([place]) 10/04/2013  . Pneumonia 07/2014   aspiration pneumonia  . Pyelonephritis   . Quadriplegia ([place])   . S/P percutaneous endoscopic gastrostomy (PEG) tube placement ([place])   . Seizures ([place])    last occurance 2015  . SIRS (systemic inflammatory response syndrome) ([place]) 08/06/2014  . Subdural hematoma ([place]) 01/02/2014  . Type 2 diabetes mellitus without complication ([place]) 89/38/1017  . UTI (urinary tract infection) 07/2014  . Vitamin D deficiency 10/16/2016  . Wheelchair bound     Past Surgical History:  Procedure Laterality Date  . COLONOSCOPY    . ESOPHAGOGASTRODUODENOSCOPY    . PEG TUBE PLACEMENT    . PERIPHERALLY INSERTED CENTRAL CATHETER INSERTION    . TONSILLECTOMY  1962    Allergies as of 06/04/2018      Reactions   Aspirin Other (See Comments)   Unknown reaction; "blood doesn't clog too well" per mother.    Keflex [cephalexin]    Rash   Nsaids Other (See Comments)   Unknown reaction   Penicillins Hives, Other (See Comments)   Unknown reaction.  Pt has tolerated cephalosporins in the past.      Medication List       Accurate as of June 04, 2018  2:41 PM. Always use your most recent med list.  amantadine 50 MG/5ML solution Commonly known as:  SYMMETREL Take 100 mg by mouth. GIVE 10 CC (100 MG) BY PEG BID   Baclofen 5 MG Tabs Take by mouth. Give 1 tablet by Per Gtube route 4 times daily   bisacodyl 10 MG suppository Commonly known as:  DULCOLAX Place 10 mg rectally daily as needed for moderate constipation. If constipation not relieved by milk of magnesia give one 10 mg suppository in 24hours   Cetaphil Gentle Cleanser Liqd Apply to face and neck with water and then pat dry daily.   Dilantin 125 MG/5ML  suspension Generic drug:  phenytoin Place 100 mg into feeding tube 2 (two) times daily.   feeding supplement (JEVITY 1.5 CAL) Liqd Place into feeding tube. Initiate Jevity 1.5 cal @@ 58 ml/hr continuous x 20 hr via peg ( hold 1 hr pre/post phenton administration)   folic acid 1 MG tablet Commonly known as:  FOLVITE 1 mg by PEG Tube route daily.   geriatric multivitamins-minerals Liqd Place 15 mLs into feeding tube daily.   hydrocortisone 20 MG tablet Commonly known as:  CORTEF Take 20 mg by mouth. Give 1 tablet via per tube twice daily   loratadine 10 MG tablet Commonly known as:  CLARITIN 10 mg by PEG Tube route daily.   magnesium hydroxide 400 MG/5ML suspension Commonly known as:  MILK OF MAGNESIA Take 30 mLs by mouth daily as needed for mild constipation.   magnesium oxide 400 MG tablet Commonly known as:  MAG-OX Take 400 mg by mouth daily.   metoCLOPramide 10 MG tablet Commonly known as:  REGLAN Take 15 mg by mouth. TAKE 1 TABLET VIA TUBE BEFORE MEALS AND AT BEDTIME   olopatadine 0.1 % ophthalmic solution Commonly known as:  PATANOL Place 1 drop into both eyes every morning.   pantoprazole sodium 40 mg/20 mL Pack Commonly known as:  PROTONIX Take by mouth 2 (two) times daily.   phosphorus 155-852-130 MG tablet Commonly known as:  K PHOS NEUTRAL 3 (three) times daily.   polyethylene glycol packet Commonly known as:  MIRALAX / GLYCOLAX Place 17 g into feeding tube daily as needed.   polyvinyl alcohol 1.4 % ophthalmic solution Commonly known as:  LIQUIFILM TEARS Place 1 drop into both eyes daily at 6 (six) AM.   psyllium 58.6 % packet Commonly known as:  METAMUCIL 1 packet daily.   RA Saline Enema 19-7 GM/118ML Enem Place 1 each rectally as needed (for constipation).   saccharomyces boulardii 250 MG capsule Commonly known as:  FLORASTOR 250 mg 2 (two) times daily.   simethicone 40 MG/0.6ML drops Commonly known as:  MYLICON Take 40 mg by mouth. Give  1.2 mls ( 80 mg total ) by G-tube twice daily   sterile water for irrigation Flush 271ml of water into peg tub every 4 hours       No orders of the defined types were placed in this encounter.   Immunization History  Administered Date(s) Administered  . Influenza Whole 12/31/2012  . Influenza-Unspecified 12/29/2011, 12/31/2012, 12/22/2014, 01/22/2016, 01/05/2017  . PPD Test 08/22/2008  . Pneumococcal-Unspecified 06/23/2010, 06/23/2015    Social History   Tobacco Use  . Smoking status: Never Smoker  . Smokeless tobacco: Never Used  Substance Use Topics  . Alcohol use: No    Family history is   Family History  Problem Relation Age of Onset  . Hypothyroidism Mother   . Hypertension Mother   . Diabetes Father   . Hypertension Father   .  Diabetes Paternal Grandfather       Review of Systems   unable secondary to patient condition today normally nonverbal; nursing-as per history present illness     Vitals:   06/04/18 1435  BP: 120/70  Pulse: 70  Resp: 18  Temp: 98 F (36.7 C)    SpO2 Readings from Last 1 Encounters:  04/13/18 95%   Body mass index is 19.49 kg/m.     Physical Exam  GENERAL APPEARANCE: Alert, non-conversant,  No acute distress.  SKIN: 3 cm swelling left chin, tender to palpation not right HEAD: Normocephalic, atraumatic  EYES: Conjunctiva/lids clear. Pupils round, reactive. EOMs intact.  EARS: External exam WNL, canals clear. Hearing grossly normal.  NOSE: No deformity or discharge.  MOUTH/THROAT: Lower lip diffusely swollen, large aphthous ulcer on the mucous membrane inside the lip RESPIRATORY: Breathing is even, unlabored. Lung sounds are clear   CARDIOVASCULAR: Heart RRR no murmurs, rubs or gallops. No peripheral edema.   GASTROINTESTINAL: Abdomen is soft, non-tender, not distended w/ normal bowel sounds.:  PEG tube GENITOURINARY: Bladder non tender, not distended  MUSCULOSKELETAL: Contracture and  wasting NEUROLOGIC: Patient  all functional quadriplegic, has some use of upper extremities, kidney disease pointing board to communicate, understands conversation PSYCHIATRIC: Mood and affect appropriate to situation, no behavioral issues  Patient Active Problem List   Diagnosis Date Noted  . Acute blood loss anemia 12/19/2017  . Mental status change resolved 11/05/2017  . Hematemesis 10/29/2017  . Upper GI bleed 10/29/2017  . Erosive esophagitis 10/29/2017  . Pseudomonas urinary tract infection 10/29/2017  . Volume overload 10/29/2017  . Compression fracture of lumbar spine, non-traumatic, sequela 10/29/2017  . Cheyne-Stokes breathing 10/29/2017  . Hyperkalemia 10/26/2017  . Urinary tract infection due to Proteus 09/21/2017  . Infection of PEG site ([place]) 09/21/2017  . Hydronephrosis with renal and ureteral calculus obstruction 09/21/2017  . Left ureteral calculus 09/21/2017  . Flexion contractures 09/21/2017  . Vitamin D deficiency 10/16/2016  . Anemia of chronic disease   . GERD (gastroesophageal reflux disease)   . Clostridium difficile colitis   . Adrenocortical insufficiency ([place])   . Cataract 06/13/2016  . Constipation 06/13/2016  . Contracture of joint of left hand 06/13/2016  . Impaired verbal communication 06/13/2016  . Nontraumatic chronic subdural hemorrhage ([place]) 06/13/2016  . Nonverbal 06/13/2016  . Wheelchair bound 06/13/2016  . Allergic rhinitis 06/13/2016  . S/P percutaneous endoscopic gastrostomy (PEG) tube placement ([place]) 06/13/2016  . Itchy eyes 11/12/2015  . Benign neoplasm of colon 05/22/2015  . History of colon polyps 05/22/2015  . Lung nodule 05/22/2015  . Abnormal levels of other serum enzymes 05/22/2015  . Gastroesophageal reflux disease 03/17/2015  . Tinea cruris 09/10/2014  . Aspiration pneumonia ([place]) 08/15/2014  . Abscess, prostate 08/15/2014  . Pyelonephritis 08/06/2014  . Nausea & vomiting   . Fever presenting with conditions classified elsewhere 03/16/2014  . Altered  mental status 03/16/2014  . Acute bronchiolitis due to unspecified organism 03/11/2014  . Enteritis due to Clostridium difficile 03/11/2014  . Type 2 diabetes mellitus without complication ([place]) 92/42/6834  . Septic shock ([place]) 01/26/2014  . Sepsis ([place]) 01/25/2014  . Acute encephalopathy 01/02/2014  . Acute respiratory failure with hypoxia and hypercapnia ([place]) 01/02/2014  . Chronic anemia 01/02/2014  . Subdural hematoma ([place]) 01/02/2014  . Pain in thoracic spine 12/26/2013  . PEG (percutaneous endoscopic gastrostomy) status ([place]) 10/04/2013  . Rash and nonspecific skin eruption 07/08/2013  . Thrush 07/08/2013  . Heme positive stool 07/07/2013  .  Severe sepsis ([place]) 06/23/2013  . E. coli pyelonephritis 06/22/2013  . SIRS (systemic inflammatory response syndrome) ([place]) 06/19/2013  . Leukocytosis 06/19/2013  . Bronchitis 06/19/2013  . Dysphagia 06/19/2013  . Aphasia 06/19/2013  . Other convulsions 08/10/2012  . Iron deficiency anemia 08/10/2012  . Glucocorticoid deficiency ([place]) 08/10/2012  . Febrile illness, acute 06/15/2012  . UTI (lower urinary tract infection) 06/15/2012  . Hypokalemia 11/10/2011  . Diabetic gastroparesis ([place]) 11/08/2011  . Ileus ([place]) 11/08/2011  . Hyponatremia 11/08/2011  . Adrenal insufficiency ([place]) 11/08/2011  . Quadriplegia ([place]) 11/08/2011  . Parkinson disease ([employer])   . Seizures ([place])   . Depression       Labs reviewed: Basic Metabolic Panel:    Component Value Date/Time   NA 137 05/03/2018   NA 135 09/18/2017   K 4.2 05/03/2018   K 4.0 09/18/2017   CL 108 06/26/2016 1424   CO2 26 06/26/2016 1424   GLUCOSE 107 (H) 06/26/2016 1424   BUN 14 05/03/2018   CREATININE 0.4 (A) 05/03/2018   CREATININE 0.49 09/18/2017   CALCIUM 8.6 09/18/2017   PROT 8.3 (H) 06/26/2016 1424   ALBUMIN 3.6 06/26/2016 1424   AST 22 05/26/2017   ALT 15 05/26/2017   ALKPHOS 106 05/26/2017   BILITOT 0.5 06/26/2016 1424   GFRNONAA >60 06/26/2016 1424   GFRAA  >90 09/18/2017    Recent Labs    09/18/17 10/25/17 10/30/17 04/13/18 05/03/18  NA 135 137  --  135* 137  K 4.0 5.3 4.3 3.6 4.2  BUN 12 11  --  15 14  CREATININE 0.49 0.5*  --  0.4* 0.4*  CALCIUM 8.6  --   --   --   --    Liver Function Tests: No results for input(s): AST, ALT, ALKPHOS, BILITOT, PROT, ALBUMIN in the last 8760 hours. No results for input(s): LIPASE, AMYLASE in the last 8760 hours. No results for input(s): AMMONIA in the last 8760 hours. CBC: Recent Labs    10/25/17 04/13/18 05/03/18  WBC 9.7 11.3 8.0  HGB 9.8* 9.2* 9.3*  HCT 30* 28* 29*  PLT 331 246 411*   Lipid No results for input(s): CHOL, HDL, LDLCALC, TRIG in the last 8760 hours.  Cardiac Enzymes: No results for input(s): CKTOTAL, CKMB, CKMBINDEX, TROPONINI in the last 8760 hours. BNP: No results for input(s): BNP in the last 8760 hours. Lab Results  Component Value Date   MICROALBUR 10.4 12/01/2016   Lab Results  Component Value Date   HGBA1C 5.0 03/24/2017   Lab Results  Component Value Date   TSH 1.78 09/13/2016   Lab Results  Component Value Date   LPFXTKWI09 735 01/29/2014   No results found for: FOLATE Lab Results  Component Value Date   IRON 48 01/29/2014   TIBC 125 (L) 01/29/2014    Imaging and Procedures obtained prior to SNF admission: Dg Abd Acute W/chest  Result Date: 06/26/2016 CLINICAL DATA:  Nausea and vomiting. Parkinson disease. Gastrostomy tube feeding. Quadriplegia. EXAM: DG ABDOMEN ACUTE W/ 1V CHEST COMPARISON:  01/11/2016 chest radiograph. 11/26/2014 CT abdomen/pelvis. FINDINGS: Stable cardiomediastinal silhouette with normal heart size and aortic atherosclerosis. No pneumothorax. No pleural effusion. Slightly low lung volumes. No pulmonary edema. No acute consolidative airspace disease. Percutaneous gastrostomy tube is seen terminating in the left upper quadrant of the abdomen. No disproportionately dilated small bowel loops or significant air-fluid levels . Moderate  colorectal stool volume. No evidence of pneumatosis or pneumoperitoneum. No radiopaque urolithiasis. IMPRESSION: 1. No active disease  in the chest . 2. Percutaneous gastrostomy tube terminates in the left upper quadrant of the abdomen. No evidence of free intraperitoneal air. 3. Nonobstructive bowel gas pattern . 4. Moderate colorectal stool volume. Electronically Signed   By: Ilona Sorrel M.D.   On: 06/26/2016 14:57     Not all labs, radiology exams or other studies done during hospitalization come through on my EPIC note; however they are reviewed by me.    Assessment and Plan  Left chin abscess-not ripe; warm compresses along with doxycycline 100 mg twice daily  Swelling lower lip- probably secondary to aphthous ulcer; may be due to the abscess problems will continue to observe; patient is not on any ACE/ARB    Clarissa Laird D. [name], MD

## 2018-06-05 DIAGNOSIS — Z881 Allergy status to other antibiotic agents status: Secondary | ICD-10-CM | POA: Diagnosis not present

## 2018-06-05 DIAGNOSIS — R509 Fever, unspecified: Secondary | ICD-10-CM | POA: Diagnosis not present

## 2018-06-05 DIAGNOSIS — R5381 Other malaise: Secondary | ICD-10-CM | POA: Diagnosis not present

## 2018-06-05 DIAGNOSIS — R6520 Severe sepsis without septic shock: Secondary | ICD-10-CM | POA: Diagnosis not present

## 2018-06-05 DIAGNOSIS — E1165 Type 2 diabetes mellitus with hyperglycemia: Secondary | ICD-10-CM | POA: Diagnosis not present

## 2018-06-05 DIAGNOSIS — M24542 Contracture, left hand: Secondary | ICD-10-CM | POA: Diagnosis not present

## 2018-06-05 DIAGNOSIS — Z431 Encounter for attention to gastrostomy: Secondary | ICD-10-CM | POA: Diagnosis not present

## 2018-06-05 DIAGNOSIS — R1312 Dysphagia, oropharyngeal phase: Secondary | ICD-10-CM | POA: Diagnosis not present

## 2018-06-05 DIAGNOSIS — E876 Hypokalemia: Secondary | ICD-10-CM | POA: Diagnosis not present

## 2018-06-05 DIAGNOSIS — T83511A Infection and inflammatory reaction due to indwelling urethral catheter, initial encounter: Secondary | ICD-10-CM | POA: Diagnosis not present

## 2018-06-05 DIAGNOSIS — F329 Major depressive disorder, single episode, unspecified: Secondary | ICD-10-CM | POA: Diagnosis present

## 2018-06-05 DIAGNOSIS — T380X5A Adverse effect of glucocorticoids and synthetic analogues, initial encounter: Secondary | ICD-10-CM | POA: Diagnosis not present

## 2018-06-05 DIAGNOSIS — Z931 Gastrostomy status: Secondary | ICD-10-CM | POA: Diagnosis not present

## 2018-06-05 DIAGNOSIS — E2740 Unspecified adrenocortical insufficiency: Secondary | ICD-10-CM | POA: Diagnosis not present

## 2018-06-05 DIAGNOSIS — N3090 Cystitis, unspecified without hematuria: Secondary | ICD-10-CM | POA: Diagnosis present

## 2018-06-05 DIAGNOSIS — L02415 Cutaneous abscess of right lower limb: Secondary | ICD-10-CM | POA: Diagnosis not present

## 2018-06-05 DIAGNOSIS — Z743 Need for continuous supervision: Secondary | ICD-10-CM | POA: Diagnosis not present

## 2018-06-05 DIAGNOSIS — R569 Unspecified convulsions: Secondary | ICD-10-CM | POA: Diagnosis not present

## 2018-06-05 DIAGNOSIS — R4702 Dysphasia: Secondary | ICD-10-CM | POA: Diagnosis not present

## 2018-06-05 DIAGNOSIS — B9620 Unspecified Escherichia coli [E. coli] as the cause of diseases classified elsewhere: Secondary | ICD-10-CM | POA: Diagnosis not present

## 2018-06-05 DIAGNOSIS — L03211 Cellulitis of face: Secondary | ICD-10-CM | POA: Diagnosis not present

## 2018-06-05 DIAGNOSIS — M24532 Contracture, left wrist: Secondary | ICD-10-CM | POA: Diagnosis not present

## 2018-06-05 DIAGNOSIS — D72829 Elevated white blood cell count, unspecified: Secondary | ICD-10-CM | POA: Diagnosis not present

## 2018-06-05 DIAGNOSIS — K9423 Gastrostomy malfunction: Secondary | ICD-10-CM | POA: Diagnosis not present

## 2018-06-05 DIAGNOSIS — X58XXXA Exposure to other specified factors, initial encounter: Secondary | ICD-10-CM | POA: Diagnosis not present

## 2018-06-05 DIAGNOSIS — I959 Hypotension, unspecified: Secondary | ICD-10-CM | POA: Diagnosis not present

## 2018-06-05 DIAGNOSIS — L89152 Pressure ulcer of sacral region, stage 2: Secondary | ICD-10-CM | POA: Diagnosis present

## 2018-06-05 DIAGNOSIS — R52 Pain, unspecified: Secondary | ICD-10-CM | POA: Diagnosis not present

## 2018-06-05 DIAGNOSIS — G9340 Encephalopathy, unspecified: Secondary | ICD-10-CM | POA: Diagnosis present

## 2018-06-05 DIAGNOSIS — I509 Heart failure, unspecified: Secondary | ICD-10-CM | POA: Diagnosis not present

## 2018-06-05 DIAGNOSIS — I700 Atherosclerosis of aorta: Secondary | ICD-10-CM | POA: Diagnosis present

## 2018-06-05 DIAGNOSIS — R918 Other nonspecific abnormal finding of lung field: Secondary | ICD-10-CM | POA: Diagnosis not present

## 2018-06-05 DIAGNOSIS — K130 Diseases of lips: Secondary | ICD-10-CM | POA: Diagnosis not present

## 2018-06-05 DIAGNOSIS — G40909 Epilepsy, unspecified, not intractable, without status epilepticus: Secondary | ICD-10-CM | POA: Diagnosis present

## 2018-06-05 DIAGNOSIS — L89891 Pressure ulcer of other site, stage 1: Secondary | ICD-10-CM | POA: Diagnosis present

## 2018-06-05 DIAGNOSIS — K122 Cellulitis and abscess of mouth: Secondary | ICD-10-CM | POA: Diagnosis not present

## 2018-06-05 DIAGNOSIS — R279 Unspecified lack of coordination: Secondary | ICD-10-CM | POA: Diagnosis not present

## 2018-06-05 DIAGNOSIS — D509 Iron deficiency anemia, unspecified: Secondary | ICD-10-CM | POA: Diagnosis present

## 2018-06-05 DIAGNOSIS — A419 Sepsis, unspecified organism: Secondary | ICD-10-CM | POA: Diagnosis not present

## 2018-06-05 DIAGNOSIS — R7881 Bacteremia: Secondary | ICD-10-CM | POA: Diagnosis not present

## 2018-06-05 DIAGNOSIS — G372 Central pontine myelinolysis: Secondary | ICD-10-CM | POA: Diagnosis present

## 2018-06-05 DIAGNOSIS — D638 Anemia in other chronic diseases classified elsewhere: Secondary | ICD-10-CM | POA: Diagnosis not present

## 2018-06-05 DIAGNOSIS — M6281 Muscle weakness (generalized): Secondary | ICD-10-CM | POA: Diagnosis not present

## 2018-06-05 DIAGNOSIS — I69828 Other speech and language deficits following other cerebrovascular disease: Secondary | ICD-10-CM | POA: Diagnosis not present

## 2018-06-05 DIAGNOSIS — R739 Hyperglycemia, unspecified: Secondary | ICD-10-CM | POA: Diagnosis not present

## 2018-06-05 DIAGNOSIS — R41841 Cognitive communication deficit: Secondary | ICD-10-CM | POA: Diagnosis not present

## 2018-06-05 DIAGNOSIS — G8250 Quadriplegia, unspecified: Secondary | ICD-10-CM | POA: Diagnosis not present

## 2018-06-05 DIAGNOSIS — I69891 Dysphagia following other cerebrovascular disease: Secondary | ICD-10-CM | POA: Diagnosis not present

## 2018-06-05 DIAGNOSIS — N390 Urinary tract infection, site not specified: Secondary | ICD-10-CM | POA: Diagnosis not present

## 2018-06-05 DIAGNOSIS — J189 Pneumonia, unspecified organism: Secondary | ICD-10-CM | POA: Diagnosis not present

## 2018-06-05 DIAGNOSIS — I69822 Dysarthria following other cerebrovascular disease: Secondary | ICD-10-CM | POA: Diagnosis not present

## 2018-06-05 DIAGNOSIS — A4102 Sepsis due to Methicillin resistant Staphylococcus aureus: Secondary | ICD-10-CM | POA: Diagnosis not present

## 2018-06-05 DIAGNOSIS — R404 Transient alteration of awareness: Secondary | ICD-10-CM | POA: Diagnosis not present

## 2018-06-05 DIAGNOSIS — R4701 Aphasia: Secondary | ICD-10-CM | POA: Diagnosis not present

## 2018-06-05 DIAGNOSIS — M62429 Contracture of muscle, unspecified upper arm: Secondary | ICD-10-CM | POA: Diagnosis not present

## 2018-06-05 DIAGNOSIS — K219 Gastro-esophageal reflux disease without esophagitis: Secondary | ICD-10-CM | POA: Diagnosis not present

## 2018-06-05 DIAGNOSIS — L0201 Cutaneous abscess of face: Secondary | ICD-10-CM | POA: Diagnosis not present

## 2018-06-05 DIAGNOSIS — Y999 Unspecified external cause status: Secondary | ICD-10-CM | POA: Diagnosis not present

## 2018-06-05 DIAGNOSIS — Z8669 Personal history of other diseases of the nervous system and sense organs: Secondary | ICD-10-CM | POA: Diagnosis not present

## 2018-06-05 DIAGNOSIS — G20 Parkinson's disease: Secondary | ICD-10-CM | POA: Diagnosis not present

## 2018-06-05 DIAGNOSIS — R339 Retention of urine, unspecified: Secondary | ICD-10-CM | POA: Diagnosis not present

## 2018-06-05 DIAGNOSIS — A4189 Other specified sepsis: Secondary | ICD-10-CM | POA: Diagnosis not present

## 2018-06-05 DIAGNOSIS — I69398 Other sequelae of cerebral infarction: Secondary | ICD-10-CM | POA: Diagnosis not present

## 2018-06-05 DIAGNOSIS — Z889 Allergy status to unspecified drugs, medicaments and biological substances status: Secondary | ICD-10-CM | POA: Diagnosis not present

## 2018-06-05 DIAGNOSIS — E119 Type 2 diabetes mellitus without complications: Secondary | ICD-10-CM | POA: Diagnosis not present

## 2018-06-05 DIAGNOSIS — R293 Abnormal posture: Secondary | ICD-10-CM | POA: Diagnosis not present

## 2018-06-05 MED ORDER — HYDROCORTISONE NA SUCCINATE PF 100 MG IJ SOLR
100.00 | INTRAMUSCULAR | Status: DC
Start: 2018-06-05 — End: 2018-06-05

## 2018-06-05 MED ORDER — GENERIC EXTERNAL MEDICATION
1.00 | Status: DC
Start: 2018-06-05 — End: 2018-06-05

## 2018-06-05 MED ORDER — POTASSIUM CHLORIDE IN NACL 20-0.9 MEQ/L-% IV SOLN
INTRAVENOUS | Status: DC
Start: ? — End: 2018-06-05

## 2018-06-05 MED ORDER — ONDANSETRON HCL 4 MG/2ML IJ SOLN
4.00 | INTRAMUSCULAR | Status: DC
Start: ? — End: 2018-06-05

## 2018-06-05 MED ORDER — ENOXAPARIN SODIUM 40 MG/0.4ML ~~LOC~~ SOLN
40.00 | SUBCUTANEOUS | Status: DC
Start: 2018-06-05 — End: 2018-06-05

## 2018-06-05 MED ORDER — GENERIC EXTERNAL MEDICATION
500.00 | Status: DC
Start: 2018-06-05 — End: 2018-06-05

## 2018-06-05 MED ORDER — ACETAMINOPHEN 325 MG PO TABS
650.00 | ORAL_TABLET | ORAL | Status: DC
Start: ? — End: 2018-06-05

## 2018-06-06 ENCOUNTER — Encounter: Payer: Self-pay | Admitting: Internal Medicine

## 2018-06-14 DIAGNOSIS — Z931 Gastrostomy status: Secondary | ICD-10-CM | POA: Diagnosis not present

## 2018-06-14 DIAGNOSIS — R4701 Aphasia: Secondary | ICD-10-CM | POA: Diagnosis not present

## 2018-06-14 DIAGNOSIS — M24532 Contracture, left wrist: Secondary | ICD-10-CM | POA: Diagnosis not present

## 2018-06-14 DIAGNOSIS — K122 Cellulitis and abscess of mouth: Secondary | ICD-10-CM | POA: Diagnosis not present

## 2018-06-14 DIAGNOSIS — G20 Parkinson's disease: Secondary | ICD-10-CM | POA: Diagnosis not present

## 2018-06-14 DIAGNOSIS — E1143 Type 2 diabetes mellitus with diabetic autonomic (poly)neuropathy: Secondary | ICD-10-CM | POA: Diagnosis not present

## 2018-06-14 DIAGNOSIS — B9562 Methicillin resistant Staphylococcus aureus infection as the cause of diseases classified elsewhere: Secondary | ICD-10-CM | POA: Diagnosis not present

## 2018-06-14 DIAGNOSIS — I69398 Other sequelae of cerebral infarction: Secondary | ICD-10-CM | POA: Diagnosis not present

## 2018-06-14 DIAGNOSIS — B9620 Unspecified Escherichia coli [E. coli] as the cause of diseases classified elsewhere: Secondary | ICD-10-CM | POA: Diagnosis not present

## 2018-06-14 DIAGNOSIS — M24542 Contracture, left hand: Secondary | ICD-10-CM | POA: Diagnosis not present

## 2018-06-14 DIAGNOSIS — E119 Type 2 diabetes mellitus without complications: Secondary | ICD-10-CM | POA: Diagnosis not present

## 2018-06-14 DIAGNOSIS — D638 Anemia in other chronic diseases classified elsewhere: Secondary | ICD-10-CM | POA: Diagnosis not present

## 2018-06-14 DIAGNOSIS — R1312 Dysphagia, oropharyngeal phase: Secondary | ICD-10-CM | POA: Diagnosis not present

## 2018-06-14 DIAGNOSIS — J189 Pneumonia, unspecified organism: Secondary | ICD-10-CM | POA: Diagnosis not present

## 2018-06-14 DIAGNOSIS — N390 Urinary tract infection, site not specified: Secondary | ICD-10-CM | POA: Diagnosis not present

## 2018-06-14 DIAGNOSIS — A4102 Sepsis due to Methicillin resistant Staphylococcus aureus: Secondary | ICD-10-CM | POA: Diagnosis not present

## 2018-06-14 DIAGNOSIS — G372 Central pontine myelinolysis: Secondary | ICD-10-CM | POA: Diagnosis not present

## 2018-06-14 DIAGNOSIS — E876 Hypokalemia: Secondary | ICD-10-CM | POA: Diagnosis not present

## 2018-06-14 DIAGNOSIS — R569 Unspecified convulsions: Secondary | ICD-10-CM | POA: Diagnosis not present

## 2018-06-14 DIAGNOSIS — R739 Hyperglycemia, unspecified: Secondary | ICD-10-CM | POA: Diagnosis not present

## 2018-06-14 DIAGNOSIS — Z8669 Personal history of other diseases of the nervous system and sense organs: Secondary | ICD-10-CM | POA: Diagnosis not present

## 2018-06-14 DIAGNOSIS — Z79899 Other long term (current) drug therapy: Secondary | ICD-10-CM | POA: Diagnosis not present

## 2018-06-14 DIAGNOSIS — R293 Abnormal posture: Secondary | ICD-10-CM | POA: Diagnosis not present

## 2018-06-14 DIAGNOSIS — I509 Heart failure, unspecified: Secondary | ICD-10-CM | POA: Diagnosis not present

## 2018-06-14 DIAGNOSIS — Z743 Need for continuous supervision: Secondary | ICD-10-CM | POA: Diagnosis not present

## 2018-06-14 DIAGNOSIS — N139 Obstructive and reflux uropathy, unspecified: Secondary | ICD-10-CM | POA: Diagnosis not present

## 2018-06-14 DIAGNOSIS — R279 Unspecified lack of coordination: Secondary | ICD-10-CM | POA: Diagnosis not present

## 2018-06-14 DIAGNOSIS — Z431 Encounter for attention to gastrostomy: Secondary | ICD-10-CM | POA: Diagnosis not present

## 2018-06-14 DIAGNOSIS — I69828 Other speech and language deficits following other cerebrovascular disease: Secondary | ICD-10-CM | POA: Diagnosis not present

## 2018-06-14 DIAGNOSIS — L0390 Cellulitis, unspecified: Secondary | ICD-10-CM | POA: Diagnosis not present

## 2018-06-14 DIAGNOSIS — M62429 Contracture of muscle, unspecified upper arm: Secondary | ICD-10-CM | POA: Diagnosis not present

## 2018-06-14 DIAGNOSIS — D508 Other iron deficiency anemias: Secondary | ICD-10-CM | POA: Diagnosis not present

## 2018-06-14 DIAGNOSIS — G8250 Quadriplegia, unspecified: Secondary | ICD-10-CM | POA: Diagnosis not present

## 2018-06-14 DIAGNOSIS — K219 Gastro-esophageal reflux disease without esophagitis: Secondary | ICD-10-CM | POA: Diagnosis not present

## 2018-06-14 DIAGNOSIS — E618 Deficiency of other specified nutrient elements: Secondary | ICD-10-CM | POA: Diagnosis not present

## 2018-06-14 DIAGNOSIS — R52 Pain, unspecified: Secondary | ICD-10-CM | POA: Diagnosis not present

## 2018-06-14 DIAGNOSIS — M6281 Muscle weakness (generalized): Secondary | ICD-10-CM | POA: Diagnosis not present

## 2018-06-14 DIAGNOSIS — R5381 Other malaise: Secondary | ICD-10-CM | POA: Diagnosis not present

## 2018-06-14 DIAGNOSIS — R41841 Cognitive communication deficit: Secondary | ICD-10-CM | POA: Diagnosis not present

## 2018-06-14 DIAGNOSIS — E2740 Unspecified adrenocortical insufficiency: Secondary | ICD-10-CM | POA: Diagnosis not present

## 2018-06-14 DIAGNOSIS — I69891 Dysphagia following other cerebrovascular disease: Secondary | ICD-10-CM | POA: Diagnosis not present

## 2018-06-14 DIAGNOSIS — I69822 Dysarthria following other cerebrovascular disease: Secondary | ICD-10-CM | POA: Diagnosis not present

## 2018-06-15 ENCOUNTER — Encounter: Payer: Self-pay | Admitting: Internal Medicine

## 2018-06-15 ENCOUNTER — Non-Acute Institutional Stay (SKILLED_NURSING_FACILITY): Payer: Medicare Other | Admitting: Internal Medicine

## 2018-06-15 DIAGNOSIS — E2740 Unspecified adrenocortical insufficiency: Secondary | ICD-10-CM | POA: Diagnosis not present

## 2018-06-15 DIAGNOSIS — G20 Parkinson's disease: Secondary | ICD-10-CM | POA: Diagnosis not present

## 2018-06-15 DIAGNOSIS — L0390 Cellulitis, unspecified: Secondary | ICD-10-CM | POA: Diagnosis not present

## 2018-06-15 DIAGNOSIS — Z931 Gastrostomy status: Secondary | ICD-10-CM

## 2018-06-15 DIAGNOSIS — N390 Urinary tract infection, site not specified: Secondary | ICD-10-CM

## 2018-06-15 DIAGNOSIS — J189 Pneumonia, unspecified organism: Secondary | ICD-10-CM | POA: Diagnosis not present

## 2018-06-15 DIAGNOSIS — B9620 Unspecified Escherichia coli [E. coli] as the cause of diseases classified elsewhere: Secondary | ICD-10-CM | POA: Diagnosis not present

## 2018-06-15 DIAGNOSIS — B9562 Methicillin resistant Staphylococcus aureus infection as the cause of diseases classified elsewhere: Secondary | ICD-10-CM | POA: Diagnosis not present

## 2018-06-15 DIAGNOSIS — N139 Obstructive and reflux uropathy, unspecified: Secondary | ICD-10-CM | POA: Diagnosis not present

## 2018-06-15 DIAGNOSIS — G8250 Quadriplegia, unspecified: Secondary | ICD-10-CM

## 2018-06-15 DIAGNOSIS — K219 Gastro-esophageal reflux disease without esophagitis: Secondary | ICD-10-CM

## 2018-06-15 DIAGNOSIS — A4102 Sepsis due to Methicillin resistant Staphylococcus aureus: Secondary | ICD-10-CM

## 2018-06-15 LAB — BASIC METABOLIC PANEL: Creatinine: 0.4 — AB (ref 0.6–1.3)

## 2018-06-15 NOTE — Progress Notes (Signed)
:   Location:  [facility] Room Number: [phone] Place of Service:  SNF (31)  [name]. [name], MD  Patient Care Team: [name], MD as PCP - General (Internal Medicine)  Extended Emergency Contact Information Primary Emergency Contact: Vanderzee,[name] Address: [address]          [place], [place] [address] [name] Phone: [phone] Mobile Phone: [phone] Relation: Mother Secondary Emergency Contact: Constance Haw [place] Phone: [phone] Relation: Brother Father: Bader, [place] Mobile Phone: [phone]     Allergies: Aspirin; Keflex [cephalexin]; Nsaids; and Penicillins  Chief Complaint  Patient presents with  . New Admit To SNF    Admit to [facility]    HPI: Patient is 63 y.o. male with quadriplegia, Parkinson's disease, GERD, depression, central pontine myelinolysis, contracture of wrist, convulsions, hyponatremia, paralytic ileus who was sent from [place] home for fever.  Patient caregiver reports pimple on left chin 2 days ago and this morning noticed his lip and chin were red and significantly more swollen.  Staff gave him Tylenol around 5 AM on the day of admission for fever.  Family reports that patient spit up yesterday.  Patient is nonverbal and has a PEG tube.  Patient recently discharged from [facility] ICU for left lower lobe pneumonia on 04/29/2018 that required intubation.  ED patient's temperature was 100.2, white count was 16.1 with left shift, elevated lactic acid 2.3, suspicious looking urine.  And chest x-ray with focal area of patchy opacity on the right base concerning for focal pneumonia.  CT face showed a 9 mm abscess in the right lower lip with extensive surrounding edema and soft tissue swelling.  Patient was admitted to [facility] from 3/10-19 for sepsis due to MRSA felt secondary to the lesion on his face which grew  out MRSA.  Patient also had a UTI that grew out E. coli.  Patient was treated with vancomycin and ertapenem throughout hospital stay and will need to be treated for them for total of 4 weeks with a end date of April 11.  Patient is admitted back to skilled nursing facility for residential care.  While at skilled nursing facility patient will be followed for Parkinson's disease treated with amantadine, adrenal insufficiency treated with hydrocortisone and GERD treated with Protonix.  Past Medical History:  Diagnosis Date  . Acute encephalopathy 01/02/2014  . Adrenal insufficiency ([place]) 11/08/2011  . Adrenocortical insufficiency ([place])   . Allergic rhinitis   . Anemia    Iron deficient  . Aphasia 06/19/2013  . Cataract   . Clostridium difficile colitis 02/2014  . Constipation   . Contracture of joint of left hand   . Depression   . Diabetic gastroparesis ([place]) 11/08/2011  . Dysphagia 06/19/2013  . Gastroesophageal reflux disease 03/17/2015  . GERD (gastroesophageal reflux disease)   . Glucocorticoid deficiency ([place]) 08/10/2012  . History of colon polyps 05/22/2015  . Ileus ([place]) 11/08/2011  . Impaired verbal communication   . Iron deficiency anemia 08/10/2012  . Lung nodule 05/22/2015  . Nontraumatic chronic subdural hemorrhage ([place])   . Parkinson disease ([place])   . Parkinson's disease ([place])   . PEG (percutaneous endoscopic gastrostomy) status ([place]) 10/04/2013  . Pneumonia 07/2014   aspiration pneumonia  . Pyelonephritis   . Quadriplegia ([place])   . S/P percutaneous endoscopic gastrostomy (PEG) tube placement ([place])   . Seizures ([place])  last occurance 2015  . SIRS (systemic inflammatory response syndrome) ([place]) 08/06/2014  . Subdural hematoma ([place]) 01/02/2014  . Type 2 diabetes mellitus without complication ([place]) 53/61/4431  . UTI (urinary tract infection) 07/2014  . Vitamin D deficiency 10/16/2016  . Wheelchair bound     Past Surgical History:  Procedure Laterality Date  . COLONOSCOPY     . ESOPHAGOGASTRODUODENOSCOPY    . PEG TUBE PLACEMENT    . PERIPHERALLY INSERTED CENTRAL CATHETER INSERTION    . TONSILLECTOMY  1962    Allergies as of 06/15/2018      Reactions   Aspirin Other (See Comments)   Unknown reaction; "blood doesn't clog too well" per mother.    Keflex [cephalexin]    Rash   Nsaids Other (See Comments)   Unknown reaction   Penicillins Hives, Other (See Comments)   Unknown reaction.  Pt has tolerated cephalosporins in the past.      Medication List       Accurate as of June 15, 2018  9:36 AM. Always use your most recent med list.        acetaminophen 325 MG tablet Commonly known as:  TYLENOL Place 650 mg into feeding tube every 6 (six) hours as needed.   amantadine 50 MG/5ML solution Commonly known as:  SYMMETREL Take 100 mg by mouth. GIVE 10 CC (100 MG) BY PEG BID   Baclofen 5 MG Tabs Take by mouth. Give 1 tablet by Per Gtube route 4 times daily   Cetaphil Gentle Cleanser Liqd Apply to face and neck with water and then pat dry daily.   cycloSPORINE 0.05 % ophthalmic emulsion Commonly known as:  RESTASIS Place 1 drop into both eyes 2 (two) times daily.   Dilantin 125 MG/5ML suspension Generic drug:  phenytoin Place 100 mg into feeding tube 2 (two) times daily.   doxycycline 100 MG capsule Commonly known as:  VIBRAMYCIN Take 100 mg by mouth 2 (two) times daily. For abscess on chin.   feeding supplement (JEVITY 1.5 CAL) Liqd Place into feeding tube. Initiate Jevity 1.5 cal @@ 58 ml/hr continuous x 20 hr via peg ( hold 1 hr pre/post phenton administration)   feeding supplement (PRO-STAT SUGAR FREE 64) Liqd Place 30 mLs into feeding tube daily.   ferrous sulfate 300 (60 Fe) MG/5ML syrup Place 300 mg into feeding tube 2 (two) times daily with a meal.   folic acid 1 MG tablet Commonly known as:  FOLVITE 1 mg by PEG Tube route daily.   geriatric multivitamins-minerals Liqd Place 15 mLs into feeding tube daily.   hydrocortisone 20  MG tablet Commonly known as:  CORTEF Take 20 mg by mouth. Give 1 tablet via per tube twice daily   loratadine 10 MG tablet Commonly known as:  CLARITIN 10 mg by PEG Tube route daily.   magnesium oxide 400 MG tablet Commonly known as:  MAG-OX Take 400 mg by mouth daily.   metoCLOPramide 10 MG tablet Commonly known as:  REGLAN Take 15 mg by mouth. TAKE 1 TABLET VIA TUBE BEFORE MEALS AND AT BEDTIME   olopatadine 0.1 % ophthalmic solution Commonly known as:  PATANOL Place 1 drop into both eyes every morning.   pantoprazole sodium 40 mg/20 mL Pack Commonly known as:  PROTONIX Take by mouth 2 (two) times daily.   phosphorus 155-852-130 MG tablet Commonly known as:  K PHOS NEUTRAL 3 (three) times daily.   polyvinyl alcohol 1.4 % ophthalmic solution Commonly known as:  [place]  1 drop into both eyes daily at 6 (six) AM.   psyllium 58.6 % packet Commonly known as:  METAMUCIL 1 packet daily.   RA Saline Enema 19-7 GM/118ML Enem Place 1 each rectally as needed (for constipation).   Refresh Optive Advanced PF 0.5-1-0.5 % Soln Generic drug:  Carboxymeth-Glyc-Polysorb PF Apply 1 drop to eye 4 (four) times daily. Wait 3-5 minutes in between different eye drops.   saccharomyces boulardii 250 MG capsule Commonly known as:  FLORASTOR 250 mg 2 (two) times daily.   simethicone 40 MG/0.6ML drops Commonly known as:  MYLICON Take 40 mg by mouth. Give 1.2 mls ( 80 mg total ) by G-tube twice daily   sterile water for irrigation Flush 263ml of water into peg tub every 4 hours   vancomycin 1,000 mg in sodium chloride 0.9 % 250 mL Inject 1,000 mg into the vein every 8 (eight) hours. In sodium chloride 0.9% 250 ml IVPB. For 23 days.       No orders of the defined types were placed in this encounter.   Immunization History  Administered Date(s) Administered  . Influenza Whole 12/31/2012  . Influenza-Unspecified 12/29/2011, 12/31/2012, 12/22/2014, 01/22/2016,  01/05/2017  . PPD Test 08/22/2008  . Pneumococcal-Unspecified 06/23/2010, 06/23/2015    Social History   Tobacco Use  . Smoking status: Never Smoker  . Smokeless tobacco: Never Used  Substance Use Topics  . Alcohol use: No    Family history is   Family History  Problem Relation Age of Onset  . Hypothyroidism Mother   . Hypertension Mother   . Diabetes Father   . Hypertension Father   . Diabetes Paternal Grandfather       Review of Systems  DATA OBTAINED: from nurse GENERAL:  no fevers, fatigue, appetite changes SKIN: No itching, or rash EYES: No eye pain, redness, discharge EARS: No earache, tinnitus, change in hearing NOSE: No congestion, drainage or bleeding  MOUTH/THROAT: No mouth or tooth pain, No sore throat RESPIRATORY: No cough, wheezing, SOB CARDIAC: No chest pain, palpitations, lower extremity edema  GI: No abdominal pain, No N/V/D or constipation, No heartburn or reflux  GU: No dysuria, frequency or urgency, or incontinence  MUSCULOSKELETAL: No unrelieved bone/joint pain NEUROLOGIC: No headache, dizziness or focal weakness PSYCHIATRIC: No c/o anxiety or sadness   Vitals:   06/15/18 0916  BP: 125/62  Pulse: 77  Resp: 18  Temp: 97.9 F (36.6 C)    SpO2 Readings from Last 1 Encounters:  04/13/18 95%   Body mass index is 19.49 kg/m.     Physical Exam  GENERAL APPEARANCE: Alert, nonconversant conversant,  No acute distress.,  Smiling looking like his baseline self SKIN: No diaphoresis rash HEAD: Normocephalic, atraumatic  EYES: Conjunctiva/lids clear. Pupils round, reactive. EOMs intact.  EARS: External exam WNL, canals clear. Hearing grossly normal.  NOSE: No deformity or discharge.  MOUTH/THROAT: Lips w/o lesions  RESPIRATORY: Breathing is even, unlabored. Lung sounds are clear   CARDIOVASCULAR: Heart RRR no murmurs, rubs or gallops. No peripheral edema.   GASTROINTESTINAL: Abdomen is soft, non-tender, not distended w/ normal bowel sounds;  PEG tube. GENITOURINARY: Bladder non tender, not distended: Foley to straight drain MUSCULOSKELETAL: Wasting and contractures NEUROLOGIC:  Cranial nerves 2-12 grossly intact: Quadriplegia, and functional with some use of upper extremities PSYCHIATRIC: Mood and affect appropriate to situation, no behavioral issues  Patient Active Problem List   Diagnosis Date Noted  . Acute blood loss anemia 12/19/2017  . Mental status change resolved 11/05/2017  .  Hematemesis 10/29/2017  . Upper GI bleed 10/29/2017  . Erosive esophagitis 10/29/2017  . Pseudomonas urinary tract infection 10/29/2017  . Volume overload 10/29/2017  . Compression fracture of lumbar spine, non-traumatic, sequela 10/29/2017  . Cheyne-Stokes breathing 10/29/2017  . Hyperkalemia 10/26/2017  . Urinary tract infection due to Proteus 09/21/2017  . Infection of PEG site ([place]) 09/21/2017  . Hydronephrosis with renal and ureteral calculus obstruction 09/21/2017  . Left ureteral calculus 09/21/2017  . Flexion contractures 09/21/2017  . Vitamin D deficiency 10/16/2016  . Anemia of chronic disease   . GERD (gastroesophageal reflux disease)   . Clostridium difficile colitis   . Adrenocortical insufficiency ([place])   . Cataract 06/13/2016  . Constipation 06/13/2016  . Contracture of joint of left hand 06/13/2016  . Impaired verbal communication 06/13/2016  . Nontraumatic chronic subdural hemorrhage ([place]) 06/13/2016  . Nonverbal 06/13/2016  . Wheelchair bound 06/13/2016  . Allergic rhinitis 06/13/2016  . S/P percutaneous endoscopic gastrostomy (PEG) tube placement ([place]) 06/13/2016  . Itchy eyes 11/12/2015  . Benign neoplasm of colon 05/22/2015  . History of colon polyps 05/22/2015  . Lung nodule 05/22/2015  . Abnormal levels of other serum enzymes 05/22/2015  . Gastroesophageal reflux disease 03/17/2015  . Tinea cruris 09/10/2014  . Aspiration pneumonia ([place]) 08/15/2014  . Abscess, prostate 08/15/2014  . Pyelonephritis  08/06/2014  . Nausea & vomiting   . Fever presenting with conditions classified elsewhere 03/16/2014  . Altered mental status 03/16/2014  . Acute bronchiolitis due to unspecified organism 03/11/2014  . Enteritis due to Clostridium difficile 03/11/2014  . Type 2 diabetes mellitus without complication ([place]) 76/22/6333  . Septic shock ([place]) 01/26/2014  . Sepsis ([place]) 01/25/2014  . Acute encephalopathy 01/02/2014  . Acute respiratory failure with hypoxia and hypercapnia ([place]) 01/02/2014  . Chronic anemia 01/02/2014  . Subdural hematoma ([place]) 01/02/2014  . Pain in thoracic spine 12/26/2013  . PEG (percutaneous endoscopic gastrostomy) status ([place]) 10/04/2013  . Rash and nonspecific skin eruption 07/08/2013  . Thrush 07/08/2013  . Heme positive stool 07/07/2013  . Severe sepsis ([place]) 06/23/2013  . E. coli pyelonephritis 06/22/2013  . SIRS (systemic inflammatory response syndrome) ([place]) 06/19/2013  . Leukocytosis 06/19/2013  . Bronchitis 06/19/2013  . Dysphagia 06/19/2013  . Aphasia 06/19/2013  . Other convulsions 08/10/2012  . Iron deficiency anemia 08/10/2012  . Glucocorticoid deficiency ([place]) 08/10/2012  . Febrile illness, acute 06/15/2012  . UTI (lower urinary tract infection) 06/15/2012  . Hypokalemia 11/10/2011  . Diabetic gastroparesis ([place]) 11/08/2011  . Ileus ([place]) 11/08/2011  . Hyponatremia 11/08/2011  . Adrenal insufficiency ([place]) 11/08/2011  . Quadriplegia ([place]) 11/08/2011  . Parkinson disease ([place])   . Seizures ([place])   . Depression       Labs reviewed: Basic Metabolic Panel:    Component Value Date/Time   NA 137 05/03/2018   NA 135 09/18/2017   K 4.2 05/03/2018   K 4.0 09/18/2017   CL 108 06/26/2016 1424   CO2 26 06/26/2016 1424   GLUCOSE 107 (H) 06/26/2016 1424   BUN 14 05/03/2018   CREATININE 0.4 (A) 05/03/2018   CREATININE 0.49 09/18/2017   CALCIUM 8.6 09/18/2017   PROT 8.3 (H) 06/26/2016 1424   ALBUMIN 3.6 06/26/2016 1424   AST 22 05/26/2017   ALT  15 05/26/2017   ALKPHOS 106 05/26/2017   BILITOT 0.5 06/26/2016 1424   GFRNONAA >60 06/26/2016 1424   GFRAA >90 09/18/2017    Recent Labs    09/18/17  10/25/17 10/30/17 04/13/18 05/03/18  NA 135  --  137  --  135* 137  K 4.0   < > 5.3 4.3 3.6 4.2  BUN 12  --  11  --  15 14  CREATININE 0.49  --  0.5*  --  0.4* 0.4*  CALCIUM 8.6  --   --   --   --   --    < > = values in this interval not displayed.   Liver Function Tests: No results for input(s): AST, ALT, ALKPHOS, BILITOT, PROT, ALBUMIN in the last 8760 hours. No results for input(s): LIPASE, AMYLASE in the last 8760 hours. No results for input(s): AMMONIA in the last 8760 hours. CBC: Recent Labs    10/25/17 04/13/18 05/03/18  WBC 9.7 11.3 8.0  HGB 9.8* 9.2* 9.3*  HCT 30* 28* 29*  PLT 331 246 411*   Lipid No results for input(s): CHOL, HDL, LDLCALC, TRIG in the last 8760 hours.  Cardiac Enzymes: No results for input(s): CKTOTAL, CKMB, CKMBINDEX, TROPONINI in the last 8760 hours. BNP: No results for input(s): BNP in the last 8760 hours. Lab Results  Component Value Date   MICROALBUR 10.4 12/01/2016   Lab Results  Component Value Date   HGBA1C 5.0 03/24/2017   Lab Results  Component Value Date   TSH 1.78 09/13/2016   Lab Results  Component Value Date   KWIOXBDZ32 992 01/29/2014   No results found for: FOLATE Lab Results  Component Value Date   IRON 48 01/29/2014   TIBC 125 (L) 01/29/2014    Imaging and Procedures obtained prior to SNF admission: Dg Abd Acute W/chest  Result Date: 06/26/2016 CLINICAL DATA:  Nausea and vomiting. Parkinson disease. Gastrostomy tube feeding. Quadriplegia. EXAM: DG ABDOMEN ACUTE W/ 1V CHEST COMPARISON:  01/11/2016 chest radiograph. 11/26/2014 CT abdomen/pelvis. FINDINGS: Stable cardiomediastinal silhouette with normal heart size and aortic atherosclerosis. No pneumothorax. No pleural effusion. Slightly low lung volumes. No pulmonary edema. No acute consolidative airspace disease.  Percutaneous gastrostomy tube is seen terminating in the left upper quadrant of the abdomen. No disproportionately dilated small bowel loops or significant air-fluid levels . Moderate colorectal stool volume. No evidence of pneumatosis or pneumoperitoneum. No radiopaque urolithiasis. IMPRESSION: 1. No active disease in the chest . 2. Percutaneous gastrostomy tube terminates in the left upper quadrant of the abdomen. No evidence of free intraperitoneal air. 3. Nonobstructive bowel gas pattern . 4. Moderate colorectal stool volume. Electronically Signed   By: Ilona Sorrel M.D.   On: 06/26/2016 14:57     Not all labs, radiology exams or other studies done during hospitalization come through on my EPIC note; however they are reviewed by me.    Assessment and Plan  Sepsis/facial abscess/cellulitis/HCAP- blood cultures growing MRSA and staph epidermidis.;  Right lower lip and left chin abscess wound cultures growing MRSA also.  Patient is being treated with vancomycin and ertapenem.  2D echo reviewed and shows possible thickening of septal leaflet of tricuspid valve.  Repeat cultures so far negative.  Plan is to continue IV vancomycin for 4 weeks which will end on April 11.  Chest x-ray showed right lung base opacity which is being treated by vancomycin and ertapenem as above.  Patient required O2 but nothing more invasive and had a full course of treatment for pneumonia SNF- admitted for OT/PT and residential care.  Patient to be on vancomycin through PICC line until April 11  UTI urine cultures grew E. Coli/urinary obstruction with catheter; patient tleated 7 days of IV antibiotics  of ertapenem  Parkinson's disease SNF- continue amantadine 100 mg per tube twice daily  Adrenal insufficiency SNF- continue home dose of 20 mg hydrocortisone twice daily per tube  GERD SNF- stable at the moment continue Protonix 40 mg twice daily and Reglan 5 mg every 8; G-tube was replaced while patient was in hospital  which is fabulous   Time spent greater than 35 minutes;> 50% of time with patient was spent reviewing records, labs, tests and studies, counseling and developing plan of care  Webb Silversmith D. [name], MD

## 2018-06-18 LAB — CBC AND DIFFERENTIAL
HCT: 33 — AB (ref 41–53)
Hemoglobin: 10.9 — AB (ref 13.5–17.5)
Platelets: 284 (ref 150–399)
WBC: 9.2

## 2018-06-18 LAB — BASIC METABOLIC PANEL
BUN: 15 (ref 4–21)
Creatinine: 0.4 — AB (ref 0.6–1.3)
Glucose: 96
POTASSIUM: 4.3 (ref 3.4–5.3)
Sodium: 133 — AB (ref 137–147)

## 2018-06-19 LAB — BASIC METABOLIC PANEL
BUN: 12 (ref 4–21)
Creatinine: 0.4 — AB (ref 0.6–1.3)
Glucose: 100
Potassium: 4 (ref 3.4–5.3)
Sodium: 138 (ref 137–147)

## 2018-06-19 LAB — CBC AND DIFFERENTIAL
HCT: 34 — AB (ref 41–53)
Hemoglobin: 11 — AB (ref 13.5–17.5)
PLATELETS: 316 (ref 150–399)
WBC: 11.2

## 2018-06-24 ENCOUNTER — Encounter: Payer: Self-pay | Admitting: Internal Medicine

## 2018-06-24 DIAGNOSIS — N139 Obstructive and reflux uropathy, unspecified: Secondary | ICD-10-CM | POA: Insufficient documentation

## 2018-06-24 DIAGNOSIS — B9620 Unspecified Escherichia coli [E. coli] as the cause of diseases classified elsewhere: Secondary | ICD-10-CM | POA: Insufficient documentation

## 2018-06-24 DIAGNOSIS — N390 Urinary tract infection, site not specified: Secondary | ICD-10-CM | POA: Insufficient documentation

## 2018-06-24 DIAGNOSIS — B9562 Methicillin resistant Staphylococcus aureus infection as the cause of diseases classified elsewhere: Secondary | ICD-10-CM | POA: Insufficient documentation

## 2018-06-24 DIAGNOSIS — L0390 Cellulitis, unspecified: Secondary | ICD-10-CM

## 2018-06-24 DIAGNOSIS — J189 Pneumonia, unspecified organism: Secondary | ICD-10-CM | POA: Insufficient documentation

## 2018-06-24 DIAGNOSIS — A4102 Sepsis due to Methicillin resistant Staphylococcus aureus: Secondary | ICD-10-CM | POA: Insufficient documentation

## 2018-06-25 LAB — BASIC METABOLIC PANEL: Creatinine: 0.4 — AB (ref 0.6–1.3)

## 2018-06-26 LAB — TSH

## 2018-06-26 LAB — BASIC METABOLIC PANEL
BUN: 12 (ref 4–21)
Creatinine: 0.5 — AB (ref 0.6–1.3)
Glucose: 108
Potassium: 3.7 (ref 3.4–5.3)
Sodium: 136 — AB (ref 137–147)

## 2018-06-26 LAB — CBC AND DIFFERENTIAL
HCT: 33 — AB (ref 41–53)
Hemoglobin: 10.7 — AB (ref 13.5–17.5)
Platelets: 258 (ref 150–399)
WBC: 11.5

## 2018-06-27 ENCOUNTER — Non-Acute Institutional Stay (SKILLED_NURSING_FACILITY): Payer: Medicare Other | Admitting: Adult Health

## 2018-06-27 ENCOUNTER — Encounter: Payer: Self-pay | Admitting: Adult Health

## 2018-06-27 DIAGNOSIS — E119 Type 2 diabetes mellitus without complications: Secondary | ICD-10-CM

## 2018-06-27 DIAGNOSIS — G20 Parkinson's disease: Secondary | ICD-10-CM | POA: Diagnosis not present

## 2018-06-27 DIAGNOSIS — R1312 Dysphagia, oropharyngeal phase: Secondary | ICD-10-CM

## 2018-06-27 DIAGNOSIS — E1143 Type 2 diabetes mellitus with diabetic autonomic (poly)neuropathy: Secondary | ICD-10-CM

## 2018-06-27 DIAGNOSIS — D508 Other iron deficiency anemias: Secondary | ICD-10-CM | POA: Diagnosis not present

## 2018-06-27 DIAGNOSIS — J3089 Other allergic rhinitis: Secondary | ICD-10-CM

## 2018-06-27 DIAGNOSIS — G8250 Quadriplegia, unspecified: Secondary | ICD-10-CM

## 2018-06-27 DIAGNOSIS — G372 Central pontine myelinolysis: Secondary | ICD-10-CM | POA: Diagnosis not present

## 2018-06-27 DIAGNOSIS — E618 Deficiency of other specified nutrient elements: Secondary | ICD-10-CM

## 2018-06-27 DIAGNOSIS — Z931 Gastrostomy status: Secondary | ICD-10-CM

## 2018-06-27 DIAGNOSIS — A4102 Sepsis due to Methicillin resistant Staphylococcus aureus: Secondary | ICD-10-CM

## 2018-06-27 DIAGNOSIS — K219 Gastro-esophageal reflux disease without esophagitis: Secondary | ICD-10-CM

## 2018-06-27 DIAGNOSIS — R569 Unspecified convulsions: Secondary | ICD-10-CM

## 2018-06-27 DIAGNOSIS — E2740 Unspecified adrenocortical insufficiency: Secondary | ICD-10-CM | POA: Diagnosis not present

## 2018-06-27 DIAGNOSIS — K3184 Gastroparesis: Secondary | ICD-10-CM

## 2018-06-27 NOTE — Progress Notes (Signed)
Location:   [facility] Room Number: 235T Place of Service:  SNF (31)   CODE STATUS: FULL  Allergies  Allergen Reactions  . Aspirin Other (See Comments)    Unknown reaction; "blood doesn't clog too well" per mother.   . Keflex [Cephalexin]     Rash  . Nsaids Other (See Comments)    Unknown reaction  . Penicillins Hives and Other (See Comments)    Unknown reaction.  Pt has tolerated cephalosporins in the past.    Chief Complaint  Patient presents with  . Medical Management of Chronic Issues    oropharyngeal dysphagia; gastroesophageal reflux disease without esophagitis; diabetic gastroparesis. Weekly follow up for the first 30 days post hospitalization.     HPI:  He is a 63 year old long term resident of this facility being seen for the management of his chronic illnesses: dysphagia; gerd; gastroparesis. He is presently being treated with IV vancomycin; being dosed by pharmacy. There are no reports of aspiration; no uncontrolled pain; on agitation present. There are no reports of fevers present.   Past Medical History:  Diagnosis Date  . Acute encephalopathy 01/02/2014  . Adrenal insufficiency ([place]) 11/08/2011  . Adrenocortical insufficiency ([place])   . Allergic rhinitis   . Anemia    Iron deficient  . Aphasia 06/19/2013  . Cataract   . Clostridium difficile colitis 02/2014  . Constipation   . Contracture of joint of left hand   . Depression   . Diabetic gastroparesis ([place]) 11/08/2011  . Dysphagia 06/19/2013  . Gastroesophageal reflux disease 03/17/2015  . GERD (gastroesophageal reflux disease)   . Glucocorticoid deficiency ([place]) 08/10/2012  . History of colon polyps 05/22/2015  . Ileus ([place]) 11/08/2011  . Impaired verbal communication   . Iron deficiency anemia 08/10/2012  . Lung nodule 05/22/2015  . Nontraumatic chronic subdural hemorrhage ([place])   . Parkinson disease ([place])   . Parkinson's disease ([place])   . PEG (percutaneous endoscopic  gastrostomy) status ([place]) 10/04/2013  . Pneumonia 07/2014   aspiration pneumonia  . Pyelonephritis   . Quadriplegia ([place])   . S/P percutaneous endoscopic gastrostomy (PEG) tube placement ([place])   . Seizures ([place])    last occurance 2015  . SIRS (systemic inflammatory response syndrome) ([place]) 08/06/2014  . Subdural hematoma ([place]) 01/02/2014  . Type 2 diabetes mellitus without complication ([place]) 73/22/0254  . UTI (urinary tract infection) 07/2014  . Vitamin D deficiency 10/16/2016  . Wheelchair bound     Past Surgical History:  Procedure Laterality Date  . COLONOSCOPY    . ESOPHAGOGASTRODUODENOSCOPY    . PEG TUBE PLACEMENT    . PERIPHERALLY INSERTED CENTRAL CATHETER INSERTION    . TONSILLECTOMY  1962    Social History   Socioeconomic History  . Marital status: Single    Spouse name: Not on file  . Number of children: Not on file  . Years of education: Not on file  . Highest education level: Not on file  Occupational History  . Occupation: truck Animator Needs  . Financial resource strain: Not on file  . Food insecurity:    Worry: Not on file    Inability: Not on file  . Transportation needs:    Medical: Not on file    Non-medical: Not on file  Tobacco Use  . Smoking status: Never Smoker  . Smokeless tobacco: Never Used  Substance and Sexual Activity  . Alcohol use: No  . Drug use: No  .  Sexual activity: Not Currently  Lifestyle  . Physical activity:    Days per week: Not on file    Minutes per session: Not on file  . Stress: Not on file  Relationships  . Social connections:    Talks on phone: Not on file    Gets together: Not on file    Attends religious service: Not on file    Active member of club or organization: Not on file    Attends meetings of clubs or organizations: Not on file    Relationship status: Not on file  . Intimate partner violence:    Fear of current or ex partner: Not on file    Emotionally abused: Not on file    Physically abused:  Not on file    Forced sexual activity: Not on file  Other Topics Concern  . Not on file  Social History Narrative   Admitted to [facility] 06/08/2005   Never married   Never smoked   Alcohol none   Full code   Family History  Problem Relation Age of Onset  . Hypothyroidism Mother   . Hypertension Mother   . Diabetes Father   . Hypertension Father   . Diabetes Paternal Grandfather       VITAL SIGNS BP 130/79   Pulse 77   Temp 98 F (36.7 C) (Oral)   Resp 18   Ht 5\' 9"  (1.753 m)   Wt 134 lb 14.7 oz (61.2 kg)   SpO2 97%   BMI 19.92 kg/m   Outpatient Encounter Medications as of 06/27/2018  Medication Sig  . acetaminophen (TYLENOL) 325 MG tablet Place 650 mg into feeding tube every 6 (six) hours as needed.  Marland Kitchen amantadine (SYMMETREL) 50 MG/5ML solution Take 100 mg by mouth. GIVE 10 CC (100 MG) BY PEG BID  . Amino Acids-Protein Hydrolys (FEEDING SUPPLEMENT, PRO-STAT SUGAR FREE 64,) LIQD Place 30 mLs into feeding tube daily.  . Baclofen 5 MG TABS Take by mouth. Give 1 tablet by Per Gtube route 4 times daily  . Carboxymeth-Glyc-Polysorb PF (REFRESH OPTIVE ADVANCED PF) 0.5-1-0.5 % SOLN Apply 1 drop to eye 4 (four) times daily. Wait 3-5 minutes in between different eye drops.  . cycloSPORINE (RESTASIS) 0.05 % ophthalmic emulsion Place 1 drop into both eyes 2 (two) times daily.  . ferrous sulfate 300 (60 Fe) MG/5ML syrup Place 300 mg into feeding tube 2 (two) times daily with a meal.  . folic acid (FOLVITE) 1 MG tablet 1 mg by PEG Tube route daily.  Marland Kitchen geriatric multivitamins-minerals (ELDERTONIC/GEVRABON) LIQD Place 15 mLs into feeding tube daily.  . hydrocortisone (CORTEF) 20 MG tablet Take 20 mg by mouth. Give 1 tablet via per tube twice daily  . loratadine (CLARITIN) 10 MG tablet 10 mg by PEG Tube route daily.  . magnesium oxide (MAG-OX) 400 MG tablet Take 400 mg by mouth daily.  . metoCLOPramide (REGLAN) 10 MG tablet Take 15 mg by mouth. TAKE 1 TABLET VIA TUBE BEFORE MEALS AND AT  BEDTIME  . Nutritional Supplements (FEEDING SUPPLEMENT, JEVITY 1.5 CAL,) LIQD Place into feeding tube. Initiate Jevity 1.5 cal @@ 58 ml/hr continuous x 20 hr via peg ( hold 1 hr pre/post phenton administration)  . olopatadine (PATANOL) 0.1 % ophthalmic solution Place 1 drop into both eyes every morning.  . pantoprazole sodium (PROTONIX) 40 mg/20 mL PACK Take by mouth 2 (two) times daily.  . phenytoin (DILANTIN) 125 MG/5ML suspension Place 100 mg into feeding tube 2 (two) times  daily.  . phosphorus (K PHOS NEUTRAL) 155-852-130 MG tablet 3 (three) times daily.  . polyvinyl alcohol (LIQUIFILM TEARS) 1.4 % ophthalmic solution Place 1 drop into both eyes daily at 6 (six) AM.  . psyllium (METAMUCIL) 58.6 % packet 1 packet daily.  Marland Kitchen saccharomyces boulardii (FLORASTOR) 250 MG capsule 250 mg 2 (two) times daily.  . simethicone (MYLICON) 40 VW/0.9WJ drops Take 40 mg by mouth. Give 1.2 mls ( 80 mg total ) by G-tube twice daily  . Soap & Cleansers (CETAPHIL GENTLE CLEANSER) LIQD Apply to face and neck with water and then pat dry daily.  . Sodium Phosphates (RA SALINE ENEMA) 19-7 GM/118ML ENEM Place 1 each rectally as needed (for constipation).  . vancomycin 1,000 mg in sodium chloride 0.9 % 250 mL Inject 1,000 mg into the vein every 8 (eight) hours. In sodium chloride 0.9% 250 ml IVPB. For 23 days.  . Water For Irrigation, Sterile (STERILE WATER FOR IRRIGATION) Flush 221ml of water into peg tub every 4 hours  . [DISCONTINUED] doxycycline (VIBRAMYCIN) 100 MG capsule Take 100 mg by mouth 2 (two) times daily. For abscess on chin.   No facility-administered encounter medications on file as of 06/27/2018.      SIGNIFICANT DIAGNOSTIC EXAMS  LABS REVIEWED TODAY   06-26-18:wbc 11.5; hgb 10.7; hct 33.3 mcv 71.9; plt 258; glucose 108 bun 12; creat 0.45 ;k+ 3.7; na++ 136 ca 8.8  Review of Systems  Reason unable to perform ROS: nonverbal    Physical Exam Constitutional:      General: He is not in acute distress.     Appearance: He is underweight. He is not diaphoretic.     Comments: Frail   Neck:     Musculoskeletal: Neck supple.     Thyroid: No thyromegaly.  Cardiovascular:     Rate and Rhythm: Normal rate and regular rhythm.     Pulses: Normal pulses.     Heart sounds: Normal heart sounds.  Pulmonary:     Effort: Pulmonary effort is normal. No respiratory distress.     Breath sounds: Normal breath sounds.  Abdominal:     General: Bowel sounds are normal. There is no distension.     Palpations: Abdomen is soft.     Tenderness: There is no abdominal tenderness.     Comments: Peg tube present without signs of infection present   Musculoskeletal:     Right lower leg: No edema.     Left lower leg: No edema.     Comments: Has contractures present  Muscle wasting present   Lymphadenopathy:     Cervical: No cervical adenopathy.  Skin:    General: Skin is warm and dry.  Neurological:     Mental Status: He is alert. Mental status is at baseline.  Psychiatric:        Mood and Affect: Mood normal.       ASSESSMENT/ PLAN:  TODAY   1. Oropharyngeal dysphagia: no signs of aspiration present; is peg tube dependent with jevity 1.5 at 58 cc per hour for 20 hours per day.   2. Gastroesophageal reflux disease without esophagitis: is stable will continue protonix 40 mg twice daily   3. Diabetic gastroparesis: is stable will continue reglan 15 mg four times daily simethicone 80 mg twice daily   4.Type 2 diabetes mellitus without complication without current long term use of insulin: is stable will continue to monitor her status.   5. Adrenocortical insufficiency: is stable will continue cortef 10 mg daily  6. Parkinson's disease: is without change will continue amantadine 100 mg twice daily   7. Iron deficiency anemia secondary to inadequate iron intake: is stable hgb 10.7 will continue iron twice daily   8. Central pontine myelinolysis/ quadriplegia: is without change: will continue  baclofen 5 mg four times daily for spasticity   9. MRSA (methicllin resistant staphylococcus aureus) septicemia: is stable no reports of fevers will continue IV vancomycin dosed by pharmacy   10. Mineral deficiency: is stable will continue magox 400 mg daily and k-phos-neutral 155-852-130 mg three times daily   11. Seizures: is stable without recent activity: will continue dilantin 100 mg twice daily   12. Chronic non-seasonal allergic rhinitis: is stable will continue claritin 10 mg daily    MD is aware of resident's narcotic use and is in agreement with current plan of care. We will attempt to wean resident as apropriate   Ok Edwards NP [facility]  Contact [phone] Monday through Friday 8am- 5pm  After hours call [phone]

## 2018-06-28 DIAGNOSIS — N390 Urinary tract infection, site not specified: Secondary | ICD-10-CM | POA: Diagnosis not present

## 2018-06-28 DIAGNOSIS — G372 Central pontine myelinolysis: Secondary | ICD-10-CM | POA: Insufficient documentation

## 2018-06-28 DIAGNOSIS — E618 Deficiency of other specified nutrient elements: Secondary | ICD-10-CM | POA: Insufficient documentation

## 2018-07-10 DIAGNOSIS — D649 Anemia, unspecified: Secondary | ICD-10-CM | POA: Diagnosis not present

## 2018-07-10 DIAGNOSIS — A4101 Sepsis due to Methicillin susceptible Staphylococcus aureus: Secondary | ICD-10-CM | POA: Diagnosis not present

## 2018-07-10 LAB — CBC AND DIFFERENTIAL
HCT: 35 — AB (ref 41–53)
Hemoglobin: 11.6 — AB (ref 13.5–17.5)
Platelets: 225 (ref 150–399)
WBC: 8

## 2018-07-10 LAB — BASIC METABOLIC PANEL
BUN: 17 (ref 4–21)
Creatinine: 0.5 — AB (ref 0.6–1.3)
Glucose: 97
Potassium: 3.5 (ref 3.4–5.3)
Sodium: 140 (ref 137–147)

## 2018-07-13 DIAGNOSIS — N390 Urinary tract infection, site not specified: Secondary | ICD-10-CM | POA: Diagnosis not present

## 2018-07-13 DIAGNOSIS — R339 Retention of urine, unspecified: Secondary | ICD-10-CM | POA: Diagnosis not present

## 2018-07-19 ENCOUNTER — Ambulatory Visit: Payer: Medicare Other | Admitting: Nurse Practitioner

## 2018-07-22 DIAGNOSIS — E161 Other hypoglycemia: Secondary | ICD-10-CM | POA: Diagnosis not present

## 2018-07-22 DIAGNOSIS — E162 Hypoglycemia, unspecified: Secondary | ICD-10-CM | POA: Diagnosis not present

## 2018-07-22 DIAGNOSIS — I499 Cardiac arrhythmia, unspecified: Secondary | ICD-10-CM | POA: Diagnosis not present

## 2018-07-22 DIAGNOSIS — I469 Cardiac arrest, cause unspecified: Secondary | ICD-10-CM | POA: Diagnosis not present

## 2018-07-27 DIAGNOSIS — 419620001 Death: Secondary | SNOMED CT | POA: Diagnosis not present

## 2018-07-27 DEATH — deceased
